# Patient Record
Sex: Male | Born: 1935
Health system: Southern US, Community
[De-identification: ages and names within clinical notes are randomized; demographics above are authoritative.]

## PROBLEM LIST (undated history)

## (undated) DIAGNOSIS — I4891 Unspecified atrial fibrillation: Secondary | ICD-10-CM

## (undated) DIAGNOSIS — R112 Nausea with vomiting, unspecified: Secondary | ICD-10-CM

## (undated) DIAGNOSIS — N400 Enlarged prostate without lower urinary tract symptoms: Secondary | ICD-10-CM

## (undated) DIAGNOSIS — E039 Hypothyroidism, unspecified: Secondary | ICD-10-CM

## (undated) DIAGNOSIS — Z87442 Personal history of urinary calculi: Secondary | ICD-10-CM

## (undated) DIAGNOSIS — J189 Pneumonia, unspecified organism: Secondary | ICD-10-CM

## (undated) DIAGNOSIS — R5383 Other fatigue: Secondary | ICD-10-CM

## (undated) DIAGNOSIS — I517 Cardiomegaly: Secondary | ICD-10-CM

## (undated) DIAGNOSIS — R0989 Other specified symptoms and signs involving the circulatory and respiratory systems: Secondary | ICD-10-CM

## (undated) DIAGNOSIS — Z973 Presence of spectacles and contact lenses: Secondary | ICD-10-CM

## (undated) DIAGNOSIS — R9430 Abnormal result of cardiovascular function study, unspecified: Secondary | ICD-10-CM

## (undated) DIAGNOSIS — I340 Nonrheumatic mitral (valve) insufficiency: Secondary | ICD-10-CM

## (undated) DIAGNOSIS — R197 Diarrhea, unspecified: Secondary | ICD-10-CM

## (undated) DIAGNOSIS — R972 Elevated prostate specific antigen [PSA]: Secondary | ICD-10-CM

## (undated) DIAGNOSIS — I499 Cardiac arrhythmia, unspecified: Secondary | ICD-10-CM

## (undated) DIAGNOSIS — R42 Dizziness and giddiness: Secondary | ICD-10-CM

## (undated) DIAGNOSIS — K56609 Unspecified intestinal obstruction, unspecified as to partial versus complete obstruction: Secondary | ICD-10-CM

## (undated) DIAGNOSIS — K219 Gastro-esophageal reflux disease without esophagitis: Secondary | ICD-10-CM

## (undated) DIAGNOSIS — I2510 Atherosclerotic heart disease of native coronary artery without angina pectoris: Secondary | ICD-10-CM

## (undated) DIAGNOSIS — D649 Anemia, unspecified: Secondary | ICD-10-CM

## (undated) DIAGNOSIS — K5090 Crohn's disease, unspecified, without complications: Secondary | ICD-10-CM

## (undated) DIAGNOSIS — Z7901 Long term (current) use of anticoagulants: Secondary | ICD-10-CM

## (undated) DIAGNOSIS — Z8701 Personal history of pneumonia (recurrent): Secondary | ICD-10-CM

## (undated) DIAGNOSIS — R5381 Other malaise: Secondary | ICD-10-CM

## (undated) DIAGNOSIS — R339 Retention of urine, unspecified: Secondary | ICD-10-CM

## (undated) DIAGNOSIS — I8290 Acute embolism and thrombosis of unspecified vein: Secondary | ICD-10-CM

## (undated) HISTORY — DX: Acute embolism and thrombosis of unspecified vein: I82.90

## (undated) HISTORY — DX: Cardiomegaly: I51.7

## (undated) HISTORY — DX: Abnormal result of cardiovascular function study, unspecified: R94.30

## (undated) HISTORY — DX: Gastro-esophageal reflux disease without esophagitis: K21.9

## (undated) HISTORY — DX: Other specified symptoms and signs involving the circulatory and respiratory systems: R09.89

## (undated) HISTORY — DX: Long term (current) use of anticoagulants: Z79.01

## (undated) HISTORY — PX: CYSTOSCOPY WITH INSERTION OF UROLIFT: SHX6678

## (undated) HISTORY — DX: Dizziness and giddiness: R42

## (undated) HISTORY — DX: Other fatigue: R53.83

## (undated) HISTORY — DX: Diarrhea, unspecified: R19.7

## (undated) HISTORY — DX: Elevated prostate specific antigen (PSA): R97.20

## (undated) HISTORY — DX: Unspecified atrial fibrillation: I48.91

## (undated) HISTORY — DX: Personal history of pneumonia (recurrent): Z87.01

## (undated) HISTORY — DX: Nausea with vomiting, unspecified: R11.2

## (undated) HISTORY — DX: Atherosclerotic heart disease of native coronary artery without angina pectoris: I25.10

## (undated) HISTORY — DX: Nonrheumatic mitral (valve) insufficiency: I34.0

## (undated) HISTORY — DX: Other malaise: R53.81

---

## 2002-10-16 HISTORY — PX: CARDIAC CATHETERIZATION: SHX172

## 2003-10-05 ENCOUNTER — Inpatient Hospital Stay (HOSPITAL_COMMUNITY): Admission: RE | Admit: 2003-10-05 | Discharge: 2003-10-08 | Payer: Self-pay | Admitting: Cardiology

## 2003-10-06 ENCOUNTER — Encounter: Payer: Self-pay | Admitting: Internal Medicine

## 2004-09-06 ENCOUNTER — Ambulatory Visit: Payer: Self-pay | Admitting: Cardiology

## 2004-10-04 ENCOUNTER — Ambulatory Visit: Payer: Self-pay | Admitting: Cardiology

## 2004-10-11 ENCOUNTER — Ambulatory Visit: Payer: Self-pay | Admitting: Cardiology

## 2004-10-21 ENCOUNTER — Ambulatory Visit: Payer: Self-pay | Admitting: Cardiology

## 2004-11-11 ENCOUNTER — Ambulatory Visit: Payer: Self-pay | Admitting: Cardiology

## 2004-12-09 ENCOUNTER — Ambulatory Visit: Payer: Self-pay

## 2005-01-06 ENCOUNTER — Ambulatory Visit: Payer: Self-pay | Admitting: Cardiology

## 2005-01-11 ENCOUNTER — Ambulatory Visit: Payer: Self-pay | Admitting: Cardiology

## 2005-02-03 ENCOUNTER — Ambulatory Visit: Payer: Self-pay | Admitting: Cardiology

## 2005-02-10 ENCOUNTER — Ambulatory Visit: Payer: Self-pay | Admitting: Cardiology

## 2005-03-10 ENCOUNTER — Ambulatory Visit: Payer: Self-pay | Admitting: Cardiology

## 2005-03-28 ENCOUNTER — Ambulatory Visit: Payer: Self-pay | Admitting: Cardiology

## 2005-04-25 ENCOUNTER — Ambulatory Visit: Payer: Self-pay | Admitting: Cardiology

## 2005-04-28 ENCOUNTER — Ambulatory Visit: Payer: Self-pay | Admitting: Cardiology

## 2005-05-05 ENCOUNTER — Ambulatory Visit: Payer: Self-pay | Admitting: Cardiology

## 2005-06-06 ENCOUNTER — Ambulatory Visit: Payer: Self-pay | Admitting: Cardiology

## 2005-07-07 ENCOUNTER — Ambulatory Visit: Payer: Self-pay | Admitting: Cardiology

## 2005-08-08 ENCOUNTER — Ambulatory Visit: Payer: Self-pay | Admitting: Cardiology

## 2005-09-12 ENCOUNTER — Ambulatory Visit: Payer: Self-pay | Admitting: Cardiology

## 2005-09-15 ENCOUNTER — Ambulatory Visit: Payer: Self-pay | Admitting: Cardiology

## 2005-10-10 ENCOUNTER — Ambulatory Visit: Payer: Self-pay | Admitting: Cardiology

## 2005-11-07 ENCOUNTER — Ambulatory Visit: Payer: Self-pay | Admitting: Cardiology

## 2005-12-05 ENCOUNTER — Ambulatory Visit: Payer: Self-pay | Admitting: Cardiology

## 2005-12-07 ENCOUNTER — Ambulatory Visit: Payer: Self-pay | Admitting: Cardiology

## 2006-01-02 ENCOUNTER — Ambulatory Visit: Payer: Self-pay | Admitting: Cardiology

## 2006-01-09 ENCOUNTER — Ambulatory Visit: Payer: Self-pay | Admitting: Cardiology

## 2006-01-30 ENCOUNTER — Ambulatory Visit: Payer: Self-pay | Admitting: Cardiology

## 2006-02-13 ENCOUNTER — Ambulatory Visit: Payer: Self-pay | Admitting: Cardiology

## 2006-02-20 ENCOUNTER — Ambulatory Visit: Payer: Self-pay | Admitting: Cardiology

## 2006-03-02 ENCOUNTER — Ambulatory Visit: Payer: Self-pay | Admitting: Cardiology

## 2006-03-30 ENCOUNTER — Ambulatory Visit: Payer: Self-pay | Admitting: Cardiology

## 2006-04-13 ENCOUNTER — Ambulatory Visit: Payer: Self-pay | Admitting: Cardiology

## 2006-05-11 ENCOUNTER — Ambulatory Visit: Payer: Self-pay | Admitting: Cardiology

## 2006-06-08 ENCOUNTER — Ambulatory Visit: Payer: Self-pay | Admitting: Cardiology

## 2006-06-22 ENCOUNTER — Ambulatory Visit: Payer: Self-pay | Admitting: Cardiology

## 2006-06-28 ENCOUNTER — Ambulatory Visit: Payer: Self-pay | Admitting: Cardiology

## 2006-07-13 ENCOUNTER — Ambulatory Visit: Payer: Self-pay | Admitting: Cardiology

## 2006-07-19 ENCOUNTER — Ambulatory Visit: Payer: Self-pay | Admitting: Cardiology

## 2006-08-16 ENCOUNTER — Ambulatory Visit: Payer: Self-pay | Admitting: Cardiology

## 2006-08-23 ENCOUNTER — Ambulatory Visit: Payer: Self-pay | Admitting: Cardiology

## 2006-09-05 ENCOUNTER — Ambulatory Visit: Payer: Self-pay | Admitting: Cardiology

## 2006-09-26 ENCOUNTER — Ambulatory Visit: Payer: Self-pay | Admitting: Cardiology

## 2006-10-03 ENCOUNTER — Ambulatory Visit: Payer: Self-pay | Admitting: Cardiology

## 2006-10-16 HISTORY — PX: COLONOSCOPY: SHX174

## 2006-10-26 ENCOUNTER — Ambulatory Visit: Payer: Self-pay | Admitting: Cardiology

## 2006-11-21 ENCOUNTER — Ambulatory Visit: Payer: Self-pay | Admitting: Cardiology

## 2006-12-19 ENCOUNTER — Ambulatory Visit: Payer: Self-pay | Admitting: Cardiology

## 2007-01-02 ENCOUNTER — Ambulatory Visit: Payer: Self-pay | Admitting: Cardiology

## 2007-01-21 ENCOUNTER — Ambulatory Visit: Payer: Self-pay | Admitting: Cardiology

## 2007-02-05 ENCOUNTER — Ambulatory Visit: Payer: Self-pay | Admitting: Cardiology

## 2007-03-05 ENCOUNTER — Ambulatory Visit: Payer: Self-pay | Admitting: Cardiology

## 2007-04-02 ENCOUNTER — Ambulatory Visit: Payer: Self-pay | Admitting: Cardiology

## 2007-04-18 ENCOUNTER — Ambulatory Visit: Payer: Self-pay | Admitting: Cardiology

## 2007-05-01 ENCOUNTER — Ambulatory Visit: Payer: Self-pay | Admitting: Cardiology

## 2007-05-16 ENCOUNTER — Ambulatory Visit: Payer: Self-pay | Admitting: Physician Assistant

## 2007-06-13 ENCOUNTER — Ambulatory Visit: Payer: Self-pay | Admitting: Cardiology

## 2007-07-02 ENCOUNTER — Ambulatory Visit: Payer: Self-pay | Admitting: Cardiology

## 2007-07-18 ENCOUNTER — Ambulatory Visit: Payer: Self-pay | Admitting: Cardiology

## 2007-08-15 ENCOUNTER — Ambulatory Visit: Payer: Self-pay | Admitting: Cardiology

## 2007-09-09 ENCOUNTER — Ambulatory Visit: Payer: Self-pay | Admitting: Cardiology

## 2007-10-07 ENCOUNTER — Ambulatory Visit: Payer: Self-pay | Admitting: Cardiology

## 2007-11-04 ENCOUNTER — Ambulatory Visit: Payer: Self-pay | Admitting: Cardiology

## 2007-12-02 ENCOUNTER — Ambulatory Visit: Payer: Self-pay | Admitting: Cardiology

## 2008-01-02 ENCOUNTER — Ambulatory Visit: Payer: Self-pay | Admitting: Cardiology

## 2008-02-03 ENCOUNTER — Ambulatory Visit: Payer: Self-pay | Admitting: Cardiology

## 2008-02-18 ENCOUNTER — Ambulatory Visit: Payer: Self-pay | Admitting: Cardiology

## 2008-03-18 ENCOUNTER — Ambulatory Visit: Payer: Self-pay | Admitting: Cardiology

## 2008-04-16 ENCOUNTER — Ambulatory Visit: Payer: Self-pay | Admitting: Cardiology

## 2008-05-12 ENCOUNTER — Ambulatory Visit: Payer: Self-pay | Admitting: Cardiology

## 2008-06-24 ENCOUNTER — Ambulatory Visit: Payer: Self-pay | Admitting: Cardiology

## 2008-07-21 ENCOUNTER — Ambulatory Visit: Payer: Self-pay | Admitting: Cardiology

## 2008-08-18 ENCOUNTER — Ambulatory Visit: Payer: Self-pay | Admitting: Cardiology

## 2008-09-15 ENCOUNTER — Ambulatory Visit: Payer: Self-pay | Admitting: Cardiology

## 2008-10-16 HISTORY — PX: CHOLECYSTECTOMY: SHX55

## 2008-10-20 ENCOUNTER — Ambulatory Visit: Payer: Self-pay | Admitting: Cardiology

## 2008-11-25 ENCOUNTER — Ambulatory Visit: Payer: Self-pay | Admitting: Cardiology

## 2008-12-22 ENCOUNTER — Ambulatory Visit: Payer: Self-pay | Admitting: Cardiology

## 2009-01-06 ENCOUNTER — Encounter: Payer: Self-pay | Admitting: Cardiology

## 2009-01-19 ENCOUNTER — Ambulatory Visit: Payer: Self-pay | Admitting: Cardiology

## 2009-02-13 DIAGNOSIS — R9430 Abnormal result of cardiovascular function study, unspecified: Secondary | ICD-10-CM

## 2009-02-23 ENCOUNTER — Ambulatory Visit: Payer: Self-pay | Admitting: Cardiology

## 2009-02-25 ENCOUNTER — Encounter: Payer: Self-pay | Admitting: Cardiology

## 2009-03-03 ENCOUNTER — Ambulatory Visit: Payer: Self-pay | Admitting: Cardiology

## 2009-03-23 ENCOUNTER — Ambulatory Visit: Payer: Self-pay | Admitting: Cardiology

## 2009-04-20 ENCOUNTER — Ambulatory Visit: Payer: Self-pay | Admitting: Cardiology

## 2009-05-18 ENCOUNTER — Ambulatory Visit: Payer: Self-pay | Admitting: Cardiology

## 2009-05-31 ENCOUNTER — Encounter: Payer: Self-pay | Admitting: *Deleted

## 2009-06-15 ENCOUNTER — Ambulatory Visit: Payer: Self-pay | Admitting: Cardiology

## 2009-06-15 LAB — CONVERTED CEMR LAB: Prothrombin Time: 22.2 s

## 2009-07-13 ENCOUNTER — Ambulatory Visit: Payer: Self-pay | Admitting: Cardiology

## 2009-08-10 ENCOUNTER — Ambulatory Visit: Payer: Self-pay | Admitting: Cardiology

## 2009-08-10 LAB — CONVERTED CEMR LAB: POC INR: 2.4

## 2009-09-07 ENCOUNTER — Ambulatory Visit: Payer: Self-pay | Admitting: Cardiology

## 2009-10-22 ENCOUNTER — Ambulatory Visit: Payer: Self-pay | Admitting: Cardiology

## 2009-11-12 ENCOUNTER — Ambulatory Visit: Payer: Self-pay | Admitting: Cardiology

## 2009-12-10 ENCOUNTER — Ambulatory Visit: Payer: Self-pay | Admitting: Cardiology

## 2009-12-10 LAB — CONVERTED CEMR LAB: POC INR: 2.1

## 2009-12-30 ENCOUNTER — Encounter: Payer: Self-pay | Admitting: Cardiology

## 2010-01-11 ENCOUNTER — Ambulatory Visit: Payer: Self-pay | Admitting: Cardiology

## 2010-01-11 LAB — CONVERTED CEMR LAB: POC INR: 2.5

## 2010-01-14 DIAGNOSIS — R112 Nausea with vomiting, unspecified: Secondary | ICD-10-CM | POA: Insufficient documentation

## 2010-01-14 DIAGNOSIS — R197 Diarrhea, unspecified: Secondary | ICD-10-CM

## 2010-01-18 ENCOUNTER — Encounter: Payer: Self-pay | Admitting: Cardiology

## 2010-01-18 ENCOUNTER — Ambulatory Visit: Payer: Self-pay | Admitting: Cardiology

## 2010-01-19 ENCOUNTER — Encounter: Payer: Self-pay | Admitting: Cardiology

## 2010-01-20 ENCOUNTER — Encounter: Payer: Self-pay | Admitting: Cardiology

## 2010-01-21 ENCOUNTER — Encounter: Payer: Self-pay | Admitting: Cardiology

## 2010-02-08 ENCOUNTER — Ambulatory Visit: Payer: Self-pay | Admitting: Cardiology

## 2010-02-08 LAB — CONVERTED CEMR LAB: POC INR: 2.5

## 2010-03-03 ENCOUNTER — Encounter: Payer: Self-pay | Admitting: Cardiology

## 2010-03-16 ENCOUNTER — Encounter: Payer: Self-pay | Admitting: Cardiology

## 2010-03-16 DIAGNOSIS — R42 Dizziness and giddiness: Secondary | ICD-10-CM

## 2010-03-17 ENCOUNTER — Encounter: Payer: Self-pay | Admitting: Cardiology

## 2010-03-17 ENCOUNTER — Ambulatory Visit: Payer: Self-pay | Admitting: Cardiology

## 2010-03-17 LAB — CONVERTED CEMR LAB: POC INR: 2.4

## 2010-03-18 ENCOUNTER — Encounter: Payer: Self-pay | Admitting: Cardiology

## 2010-04-07 ENCOUNTER — Encounter: Payer: Self-pay | Admitting: Cardiology

## 2010-04-21 ENCOUNTER — Ambulatory Visit: Payer: Self-pay | Admitting: Cardiology

## 2010-04-21 ENCOUNTER — Encounter: Payer: Self-pay | Admitting: Cardiology

## 2010-04-22 ENCOUNTER — Encounter: Payer: Self-pay | Admitting: Cardiology

## 2010-05-03 ENCOUNTER — Ambulatory Visit: Payer: Self-pay | Admitting: Cardiology

## 2010-05-24 ENCOUNTER — Ambulatory Visit: Payer: Self-pay | Admitting: Cardiology

## 2010-05-25 ENCOUNTER — Ambulatory Visit: Payer: Self-pay | Admitting: Cardiology

## 2010-06-21 ENCOUNTER — Ambulatory Visit: Payer: Self-pay | Admitting: Cardiology

## 2010-06-21 LAB — CONVERTED CEMR LAB: POC INR: 1.9

## 2010-07-12 ENCOUNTER — Ambulatory Visit: Payer: Self-pay | Admitting: Cardiology

## 2010-07-12 LAB — CONVERTED CEMR LAB: POC INR: 2.3

## 2010-08-09 ENCOUNTER — Ambulatory Visit: Payer: Self-pay | Admitting: Cardiology

## 2010-08-09 LAB — CONVERTED CEMR LAB: POC INR: 2.3

## 2010-09-13 ENCOUNTER — Ambulatory Visit: Payer: Self-pay | Admitting: Cardiology

## 2010-09-13 LAB — CONVERTED CEMR LAB: POC INR: 2.1

## 2010-09-16 ENCOUNTER — Encounter: Payer: Self-pay | Admitting: Cardiology

## 2010-09-17 ENCOUNTER — Encounter: Payer: Self-pay | Admitting: Cardiology

## 2010-09-19 ENCOUNTER — Encounter: Payer: Self-pay | Admitting: Cardiology

## 2010-10-14 ENCOUNTER — Ambulatory Visit: Payer: Self-pay | Admitting: Cardiology

## 2010-11-11 ENCOUNTER — Ambulatory Visit: Admission: RE | Admit: 2010-11-11 | Discharge: 2010-11-11 | Payer: Self-pay | Source: Home / Self Care

## 2010-11-13 LAB — CONVERTED CEMR LAB: INR: 2.4

## 2010-11-15 NOTE — Letter (Signed)
Summary: Appointment- Rescheduled   [facility]. [address] [place], [place] [address]   Phone: [phone]  Fax: [phone]     Mar 03, 2010 MRN: [id number]     [name] [address] [place], [place]  [address]     Dear [name],   Due to a change in our office schedule, your appointment on  March 23, 2010 at 9:15 am  must be changed.    Your new appointment will be March 17, 2010 at 1:15pm  We look forward to participating in your health care needs.      Sincerely,  Public relations account executive

## 2010-11-15 NOTE — Medication Information (Signed)
Summary: ccr-lr  Anticoagulant Therapy  Managed by: Edrick Oh, RN PCP: Consuello Masse, MD Supervising MD: Aundra Dubin MD, [place] Indication 1: Atrial Fibrillation (ICD-427.31) Lab Used: [place] Site: [place] INR POC 2.1 INR RANGE 1.7  Dietary changes: no    Health status changes: no    Bleeding/hemorrhagic complications: no    Recent/future hospitalizations: no    Any changes in medication regimen? no    Recent/future dental: no  Any missed doses?: no       Is patient compliant with meds? yes       Allergies: No Known Drug Allergies  Anticoagulation Management History:      The patient is taking warfarin and comes in today for a routine follow up visit.  He is being anticoagulated because of chronic atrial fibrillation.  Positive risk factors for bleeding include an age of 75 years or older.  The bleeding index is 'intermediate risk'.  Positive CHADS2 values include Age > 70 years old.  The start date was 10/12/2003.  Plans are to continue warfarin for life.  His last INR was 1.7.  Anticoagulation responsible provider: Aundra Dubin MD, [place].  INR POC: 2.1.  Cuvette Lot#: [id number].  Exp: 10/11.    Anticoagulation Management Assessment/Plan:      The patient's current anticoagulation dose is Warfarin sodium 2.5 mg tabs: Take 1 tablet by mouth daily except 1 1/2 tablets on T, Th, Sat.  The target INR is 2.0-3.0.  The next INR is due 10/14/2010.  Anticoagulation instructions were given to patient.  Results were reviewed/authorized by Edrick Oh, RN.  He was notified by Edrick Oh RN.         Prior Anticoagulation Instructions: INR 2.3 Continue coumadin 2.18m once daily except 3.7629mon T,Th,Sat  Current Anticoagulation Instructions: INR 2.1 Continue coumadin 2.29m929mnce daily except 3.729m46m T,Th,Sat

## 2010-11-15 NOTE — Miscellaneous (Signed)
  Clinical Lists Changes  Problems: Added new problem of GERD (ICD-530.81) Added new problem of * LA CLOT Added new problem of MITRAL REGURGITATION (ICD-396.3) Added new problem of COUMADIN THERAPY (ICD-V58.61) Observations: Added new observation of PAST MED HX: FATIGUE / MALAISE (ICD-780.79) CAD  cath 2004...mild/mod nonobstuctive   /     nuclear...2007....small inferior scar ??, no ischemia ATRIAL FIBRILLATION GERD LA clot....small...in past EF  55-60%...echo.Marland Kitchen.02/2009 [facility]   mild..echo.Marland Kitchen.02/2009 MR  mild...echo...flat closure Elevated PSA Coumadin Rx (03/16/2010 10:54) Added new observation of PRIMARY MD: Consuello Masse, MD (03/16/2010 10:54)       Past History:  Past Medical History: FATIGUE / MALAISE (ICD-780.79) CAD  cath 2004...mild/mod nonobstuctive   /     nuclear...2007....small inferior scar ??, no ischemia ATRIAL FIBRILLATION GERD LA clot....small...in past EF  55-60%...echo.Marland Kitchen.02/2009 [facility]   mild..echo.Marland Kitchen.02/2009 MR  mild...echo...flat closure Elevated PSA Coumadin Rx

## 2010-11-15 NOTE — Letter (Signed)
Summary: Appointment- Rescheduled  [facility]. [address] [place], [place] [address]   Phone: [phone]  Fax: [phone]     April 07, 2010 MRN: [id number]     JANES COLEGROVE [place], [place]  [address]     Dear Mr. DANNEMILLER,   Due to a change in our office schedule, your appointment on   April 20, 2010 at  2:45 pm must be changed.   Your new appointment has been scheduled July 7th, 2011 at  3:15pm.   We look forward to participating in your health care needs.      Sincerely,  Public relations account executive

## 2010-11-15 NOTE — Medication Information (Signed)
Summary: ccr-lr  Anticoagulant Therapy  Managed by: Edrick Oh, RN PCP: Consuello Masse, MD Supervising MD: Percival Spanish MD, Jeneen Rinks Indication 1: Atrial Fibrillation (ICD-427.31) Lab Used: [place] Site: [place] INR POC 1.9 INR RANGE 1.7  Dietary changes: no    Health status changes: no    Bleeding/hemorrhagic complications: no    Recent/future hospitalizations: yes       Details: In [place] x 4 days 06/07/10    Any changes in medication regimen? no    Recent/future dental: no  Any missed doses?: yes     Details: Was off coumadin x 4 days  Is patient compliant with meds? yes       Allergies: No Known Drug Allergies  Anticoagulation Management History:      The patient is taking warfarin and comes in today for a routine follow up visit.  He is being anticoagulated due to chronic atrial fibrillation.  Positive risk factors for bleeding include an age of 75 years or older.  The bleeding index is 'intermediate risk'.  Positive CHADS2 values include Age > 82 years old.  The start date was 10/12/2003.  Plans are to continue warfarin for life.  His last INR was 1.7.  Anticoagulation responsible provider: Percival Spanish MD, Jeneen Rinks.  INR POC: 1.9.  Cuvette Lot#: [id number].  Exp: 10/11.    Anticoagulation Management Assessment/Plan:      The patient's current anticoagulation dose is Warfarin sodium 2.5 mg tabs: Take 1 tablet by mouth daily except 1 1/2 tablets on T, Th, Sat.  The target INR is 2.0-3.0.  The next INR is due 07/12/2010.  Anticoagulation instructions were given to patient.  Results were reviewed/authorized by Edrick Oh, RN.  He was notified by Edrick Oh RN.         Prior Anticoagulation Instructions: INR 2.5 Continue coumadin 2.28m once daily except 3.729mon T,Th.Sat  Current Anticoagulation Instructions: INR 1.9 Take 47m80monight, 3.747m31m Wednesday night then resume 2.47mg 73me daily except 3.747mg 30m,Th,Sat

## 2010-11-15 NOTE — Medication Information (Signed)
Summary: ccr-lr  Anticoagulant Therapy  Managed by: Edrick Oh, RN PCP: Consuello Masse, MD Supervising MD: Dannielle Burn MD, Luvenia Heller Indication 1: Atrial Fibrillation (ICD-427.31) Lab Used: [place] Site: [place] INR POC 2.3 INR RANGE 1.7  Dietary changes: no    Health status changes: no    Bleeding/hemorrhagic complications: no    Recent/future hospitalizations: no    Any changes in medication regimen? yes       Details: Been on Meclazine for vertigo  Recent/future dental: no  Any missed doses?: no       Is patient compliant with meds? yes       Allergies: No Known Drug Allergies  Anticoagulation Management History:      The patient is taking warfarin and comes in today for a routine follow up visit.  He is being anticoagulated because of chronic atrial fibrillation.  Positive risk factors for bleeding include an age of 50 years or older.  The bleeding index is 'intermediate risk'.  Positive CHADS2 values include Age > 63 years old.  The start date was 10/12/2003.  Plans are to continue warfarin for life.  His last INR was 1.7.  Anticoagulation responsible provider: Dannielle Burn MD, Luvenia Heller.  INR POC: 2.3.  Cuvette Lot#: [id number].  Exp: 10/11.    Anticoagulation Management Assessment/Plan:      The patient's current anticoagulation dose is Warfarin sodium 2.5 mg tabs: Take 1 tablet by mouth daily except 1 1/2 tablets on T, Th, Sat.  The target INR is 2.0-3.0.  The next INR is due 09/13/2010.  Anticoagulation instructions were given to patient.  Results were reviewed/authorized by Edrick Oh, RN.  He was notified by Edrick Oh RN.         Prior Anticoagulation Instructions: INR 2.3 Continue coumadin 2.40m once daily except 3.725mon T,Th,Sat  Current Anticoagulation Instructions: Same as Prior Instructions.

## 2010-11-15 NOTE — Medication Information (Signed)
Summary: ccr-lr  Anticoagulant Therapy  Managed by: Edrick Oh, RN PCP: Consuello Masse, MD Supervising MD: Percival Spanish MD, Jeneen Rinks Indication 1: Atrial Fibrillation (ICD-427.31) Lab Used: [place] Site: [place] INR POC 2.5 INR RANGE 1.7  Dietary changes: no    Health status changes: no    Bleeding/hemorrhagic complications: no    Recent/future hospitalizations: no    Any changes in medication regimen? no    Recent/future dental: no  Any missed doses?: no       Is patient compliant with meds? yes       Allergies: No Known Drug Allergies  Anticoagulation Management History:      The patient is taking warfarin and comes in today for a routine follow up visit.  He is being anticoagulated due to chronic atrial fibrillation.  Positive risk factors for bleeding include an age of 75 years or older.  The bleeding index is 'intermediate risk'.  Positive CHADS2 values include Age > 75 years old.  The start date was 10/12/2003.  Plans are to continue warfarin for life.  His last INR was 1.7.  Anticoagulation responsible provider: Percival Spanish MD, Jeneen Rinks.  INR POC: 2.5.  Cuvette Lot#: [id number].  Exp: 10/11.    Anticoagulation Management Assessment/Plan:      The patient's current anticoagulation dose is Warfarin sodium 2.5 mg tabs: Take 1 tablet by mouth daily except 1 1/2 tablets on T, Th, Sat.  The target INR is 2.0-3.0.  The next INR is due 06/21/2010.  Anticoagulation instructions were given to patient.  Results were reviewed/authorized by Edrick Oh, RN.  He was notified by Edrick Oh RN.         Prior Anticoagulation Instructions: INR 2.0 Increase coumadin to 2.54m once daily except 3.761mon T,Th,Sat  Current Anticoagulation Instructions: INR 2.5 Continue coumadin 2.11m18mnce daily except 3.711m81m T,Th.Sat Prescriptions: WARFARIN SODIUM 2.5 MG TABS (WARFARIN SODIUM) Take 1 tablet by mouth daily except 1 1/2 tablets on T, Th, Sat  #45 x 3   Entered by:   LisaEdrick Oh Authorized by:   Guy Terald Sleeper, FACCIronbound Endosurgical Center Incigned by:   LisaEdrick Ohon 05/24/2010   Method used:   Electronically to        [facility]       [address]   [place]  [id number]   Ph: [phone]   Fax: [phone]:   [id number]

## 2010-11-15 NOTE — Medication Information (Signed)
Summary: ccr-lr  Anticoagulant Therapy  Managed by: Edrick Oh, RN PCP: Trilby Leaver MD: Ron Parker MD, Dellis Filbert Indication 1: Atrial Fibrillation (ICD-427.31) Lab Used: [place] Site: [place] INR POC 2.0  Dietary changes: no    Health status changes: no    Bleeding/hemorrhagic complications: no    Recent/future hospitalizations: no    Any changes in medication regimen? no    Recent/future dental: no  Any missed doses?: no       Is patient compliant with meds? yes       Anticoagulation Management History:      The patient is taking warfarin and comes in today for a routine follow up visit.  Positive risk factors for bleeding include an age of 36 years or older.  The bleeding index is 'intermediate risk'.  Positive CHADS2 values include Age > 8 years old.  The start date was 10/12/2003.  Anticoagulation responsible provider: Ron Parker MD, Dellis Filbert.  INR POC: 2.0.  Exp: 10/11.    Anticoagulation Management Assessment/Plan:      The patient's current anticoagulation dose is Warfarin sodium 2.5 mg tabs: Take 1 tablet by mouth.  The target INR is 2 - 3.  The next INR is due 12/10/2009.  Anticoagulation instructions were given to patient.  Results were reviewed/authorized by Edrick Oh, RN.  He was notified by Edrick Oh RN.         Prior Anticoagulation Instructions: INR 1.6 Take coumadin 2m tonight, 3.71mtomorrow night then resume 2.48m48mnce daily   Current Anticoagulation Instructions: INR 2.0 Increase coumadin to 2.48mg548mce daily except 3.748mg67mFridays

## 2010-11-15 NOTE — Medication Information (Signed)
Summary: ccr-lr  Anticoagulant Therapy  Managed by: Anthony Oh, RN PCP: Anthony Leaver MD: Anthony Burn MD, [name] Indication 1: Atrial Fibrillation (ICD-427.31) Lab Used: [place] Site: [place] INR POC 2.5  Dietary changes: no    Health status changes: no    Bleeding/hemorrhagic complications: no    Recent/future hospitalizations: no    Any changes in medication regimen? no    Recent/future dental: no  Any missed doses?: no       Is patient compliant with meds? yes       Anticoagulation Management History:      The patient is taking warfarin and comes in today for a routine follow up visit.  Positive risk factors for bleeding include an age of 75 years or older.  The bleeding index is 'intermediate risk'.  Positive CHADS2 values include Age > 31 years old.  The start date was 10/12/2003.  Anticoagulation responsible provider: Dannielle Burn MD, [name].  INR POC: 2.5.  Cuvette Lot#: [id number].  Exp: 10/11.    Anticoagulation Management Assessment/Plan:      The patient's current anticoagulation dose is Warfarin sodium 2.5 mg tabs: Take 1 tablet by mouth.  The target INR is 2 - 3.  The next INR is due 02/08/2010.  Anticoagulation instructions were given to patient.  Results were reviewed/authorized by Anthony Oh, RN.  He was notified by Anthony Oh RN.         Prior Anticoagulation Instructions: INR 2.1 Continue coumadin 2.69m once daily except 3.756mon Fridays  Current Anticoagulation Instructions: INR 2.5 Continue coumadin 2.81m33mnce daily except 3.781m57m Fridays

## 2010-11-15 NOTE — Medication Information (Signed)
Summary: [facility]  Anticoagulant Therapy  Managed by: Gurney Maxin, RN PCP: Consuello Masse, MD Supervising MD: Domenic Polite MD, Mikeal Hawthorne Indication 1: Atrial Fibrillation (ICD-427.31) Lab Used: [place] Site: [place] INR POC 1.7  Dietary changes: no    Health status changes: no    Bleeding/hemorrhagic complications: no    Recent/future hospitalizations: no    Any changes in medication regimen? yes       Details: Digoxin recently stopped   Recent/future dental: no  Any missed doses?: no       Is patient compliant with meds? yes       Allergies: No Known Drug Allergies  Anticoagulation Management History:      The patient is taking warfarin and comes in today for a routine follow up visit.  He is being anticoagulated due to chronic atrial fibrillation.  Positive risk factors for bleeding include an age of 39 years or older.  The bleeding index is 'intermediate risk'.  Positive CHADS2 values include Age > 15 years old.  The start date was 10/12/2003.  Plans are to continue warfarin for life.  His last INR was 1.7.  Anticoagulation responsible provider: Domenic Polite MD, Mikeal Hawthorne.  INR POC: 1.7.  Cuvette Lot#: [id number].  Exp: 10/11.    Anticoagulation Management Assessment/Plan:      The patient's current anticoagulation dose is Warfarin sodium 2.5 mg tabs: Take 1 tablet by mouth.  The target INR is 2.0-3.0.  The next INR is due 04/20/2010.  Anticoagulation instructions were given to patient.  Results were reviewed/authorized by Gurney Maxin, RN.  He was notified by Gurney Maxin, RN.         Prior Anticoagulation Instructions: INR 2.4 Continue coumadin 2.76m once daily except 3.732mon Fridays  Current Anticoagulation Instructions: INR 1.7 Take coumadin 3.7538monight then increase dose to 2.5mg77mce daily except 3.75mg72mTuesdays and Fridays

## 2010-11-15 NOTE — Medication Information (Signed)
Summary: ccr-lr  Anticoagulant Therapy  Managed by: Edrick Oh, RN PCP: Trilby Leaver MD: Ron Parker MD, Dellis Filbert Indication 1: Atrial Fibrillation (ICD-427.31) Lab Used: [place] Site: [place] INR POC 2.1  Dietary changes: no    Health status changes: no    Bleeding/hemorrhagic complications: no    Recent/future hospitalizations: no    Any changes in medication regimen? no    Recent/future dental: no  Any missed doses?: no       Is patient compliant with meds? yes       Anticoagulation Management History:      The patient is taking warfarin and comes in today for a routine follow up visit.  Positive risk factors for bleeding include an age of 25 years or older.  The bleeding index is 'intermediate risk'.  Positive CHADS2 values include Age > 75 years old.  The start date was 10/12/2003.  Anticoagulation responsible provider: Ron Parker MD, Dellis Filbert.  INR POC: 2.1.  Cuvette Lot#: [id number].  Exp: 10/11.    Anticoagulation Management Assessment/Plan:      The patient's current anticoagulation dose is Warfarin sodium 2.5 mg tabs: Take 1 tablet by mouth.  The target INR is 2 - 3.  The next INR is due 01/11/2010.  Anticoagulation instructions were given to patient.  Results were reviewed/authorized by Edrick Oh, RN.  He was notified by Edrick Oh RN.         Prior Anticoagulation Instructions: INR 2.0 Increase coumadin to 2.53m once daily except 3.721mon Fridays  Current Anticoagulation Instructions: INR 2.1 Continue coumadin 2.26m70mnce daily except 3.726m22m Fridays

## 2010-11-15 NOTE — Medication Information (Signed)
Summary: ccr-lr  Anticoagulant Therapy  Managed by: Edrick Oh, RN PCP: Trilby Leaver MD: Dannielle Burn MD, Luvenia Heller Indication 1: Atrial Fibrillation (ICD-427.31) Lab Used: [place] Site: [place] INR POC 2.5  Dietary changes: no    Health status changes: no    Bleeding/hemorrhagic complications: no    Recent/future hospitalizations: no    Any changes in medication regimen? no    Recent/future dental: no  Any missed doses?: no       Is patient compliant with meds? yes       Anticoagulation Management History:      The patient is taking warfarin and comes in today for a routine follow up visit.  Positive risk factors for bleeding include an age of 17 years or older.  The bleeding index is 'intermediate risk'.  Positive CHADS2 values include Age > 30 years old.  The start date was 10/12/2003.  Anticoagulation responsible provider: Dannielle Burn MD, Luvenia Heller.  INR POC: 2.5.  Cuvette Lot#: [id number].  Exp: 10/11.    Anticoagulation Management Assessment/Plan:      The patient's current anticoagulation dose is Warfarin sodium 2.5 mg tabs: Take 1 tablet by mouth.  The target INR is 2 - 3.  The next INR is due 03/09/2010.  Anticoagulation instructions were given to patient.  Results were reviewed/authorized by Edrick Oh, RN.  He was notified by Edrick Oh RN.         Prior Anticoagulation Instructions: INR 2.5 Continue coumadin 2.59m once daily except 3.771mon Fridays  Current Anticoagulation Instructions: INR 2.5  Continue coumadin 2.88m66mnce daily except 3.788m28m Fridays

## 2010-11-15 NOTE — Medication Information (Signed)
Summary: ccr-lr  Anticoagulant Therapy  Managed by: Edrick Oh, RN PCP: Trilby Leaver MD: Domenic Polite MD, Mikeal Hawthorne Indication 1: Atrial Fibrillation (ICD-427.31) Lab Used: [place] Site: [place] INR POC 1.6  Dietary changes: no    Health status changes: no    Bleeding/hemorrhagic complications: no    Recent/future hospitalizations: no    Any changes in medication regimen? yes       Details: Tamsulosin for bladder  Recent/future dental: no  Any missed doses?: yes     Details: was off coumadin 6 days for gallbladder surgery  Is patient compliant with meds? yes       Anticoagulation Management History:      The patient is taking warfarin and comes in today for a routine follow up visit.  Positive risk factors for bleeding include an age of 75 years or older.  The bleeding index is 'intermediate risk'.  Positive CHADS2 values include Age > 27 years old.  The start date was 10/12/2003.  Anticoagulation responsible provider: Domenic Polite MD, Mikeal Hawthorne.  INR POC: 1.6.  Cuvette Lot#: [id number].  Exp: 10/11.    Anticoagulation Management Assessment/Plan:      The patient's current anticoagulation dose is Warfarin sodium 2.5 mg tabs: Take 1 tablet by mouth.  The target INR is 2 - 3.  The next INR is due 11/12/2009.  Anticoagulation instructions were given to patient.  Results were reviewed/authorized by Edrick Oh, RN.  He was notified by Edrick Oh RN.         Prior Anticoagulation Instructions: INR 2.1 Continue coumadin 2.55m once daily   Current Anticoagulation Instructions: INR 1.6 Take coumadin 561mtonight, 3.7558momorrow night then resume 2.5mg42mce daily

## 2010-11-15 NOTE — Medication Information (Signed)
Summary: [facility]-LAST CHECK AT [facility] 7/7-[facility]  Anticoagulant Therapy  Managed by: Edrick Oh, RN PCP: Consuello Masse, MD Supervising MD: Dannielle Burn MD, Luvenia Heller Indication 1: Atrial Fibrillation (ICD-427.31) Lab Used: [place] Site: [place] INR POC 2.0 INR RANGE 1.7  Dietary changes: no    Health status changes: no    Bleeding/hemorrhagic complications: no    Recent/future hospitalizations: no    Any changes in medication regimen? no    Recent/future dental: no  Any missed doses?: no       Is patient compliant with meds? yes       Allergies: No Known Drug Allergies  Anticoagulation Management History:      The patient is taking warfarin and comes in today for a routine follow up visit.  He is being anticoagulated due to chronic atrial fibrillation.  Positive risk factors for bleeding include an age of 5 years or older.  The bleeding index is 'intermediate risk'.  Positive CHADS2 values include Age > 52 years old.  The start date was 10/12/2003.  Plans are to continue warfarin for life.  His last INR was 1.7.  Anticoagulation responsible provider: Dannielle Burn MD, Luvenia Heller.  INR POC: 2.0.  Cuvette Lot#: [id number].  Exp: 10/11.    Anticoagulation Management Assessment/Plan:      The patient's current anticoagulation dose is Warfarin sodium 2.5 mg tabs: Take 1 tablet by mouth.  The target INR is 2.0-3.0.  The next INR is due 05/24/2010.  Anticoagulation instructions were given to patient.  Results were reviewed/authorized by Edrick Oh, RN.  He was notified by Edrick Oh RN.         Prior Anticoagulation Instructions: INR 1.7 Take coumadin 3.24m tonight then increase dose to 2.557monce daily except 3.7554mn Tuesdays and Fridays  Current Anticoagulation Instructions: INR 2.0 Increase coumadin to 2.5mg66mce daily except 3.75mg37mT,Th,Sat

## 2010-11-15 NOTE — Letter (Signed)
Summary: External Correspondence/ [employer] [facility]  External Correspondence/ [employer] [facility]   Imported By: Bartholomew Boards 12/30/2009 10:36:16  _____________________________________________________________________  External Attachment:    Type:   Image     Comment:   External Document

## 2010-11-15 NOTE — Medication Information (Signed)
Summary: [facility]  Anticoagulant Therapy  Managed by: Gurney Maxin, RN PCP: Consuello Masse, MD Supervising MD: Domenic Polite MD, Mikeal Hawthorne Indication 1: Atrial Fibrillation (ICD-427.31) Lab Used: [place] Site: [place] INR POC 2.4  Dietary changes: no    Health status changes: no    Bleeding/hemorrhagic complications: no    Recent/future hospitalizations: no    Any changes in medication regimen? no    Recent/future dental: no  Any missed doses?: no       Is patient compliant with meds? yes       Allergies: No Known Drug Allergies  Anticoagulation Management History:      The patient is taking warfarin and comes in today for a routine follow up visit.  Positive risk factors for bleeding include an age of 75 years or older.  The bleeding index is 'intermediate risk'.  Positive CHADS2 values include Age > 52 years old.  The start date was 10/12/2003.  His last INR was 2.4.  Anticoagulation responsible provider: Domenic Polite MD, Mikeal Hawthorne.  INR POC: 2.4.  Cuvette Lot#: [id number].  Exp: 10/11.    Anticoagulation Management Assessment/Plan:      The patient's current anticoagulation dose is Warfarin sodium 2.5 mg tabs: Take 1 tablet by mouth.  The target INR is 2 - 3.  The next INR is due 04/20/2010.  Anticoagulation instructions were given to patient.  Results were reviewed/authorized by Gurney Maxin, RN.  He was notified by Gurney Maxin, RN.         Prior Anticoagulation Instructions: INR 2.5  Continue coumadin 2.44m once daily except 3.769mon Fridays  Current Anticoagulation Instructions: INR 2.4 Continue coumadin 2.49m23mnce daily except 3.749m73m Fridays

## 2010-11-15 NOTE — Assessment & Plan Note (Signed)
Summary: 1 YR FU PER MAY REMINDER/INR check-SRS   Visit Type:  Follow-up Primary Provider:  Consuello Masse, MD  CC:  atrial fibrillation.  History of Present Illness: The patient is seen for followup of atrial fibrillation.  I saw him last in the office Feb 23, 2009. 5 he was hospitalized in April, 2011 with GI symptoms.  His cardiac status was stable.  It turns out that he had some type of GI virus it was very significant.  This resolved and he is now stable.  He does have some mild dizziness.  This occurs mostly when he gets up in the morning.  He rests and it stabilizes.  He has not had syncope or presyncope.  He's not had any chest pain.  He has had some generalized fatigue.  This does not appear to be exertional.  He has continued to be quite active.  Current Medications (verified): 1)  Carvedilol 12.5 Mg Tabs (Carvedilol) .... Take 1 Tablet By Mouth Twice A Day 2)  Digoxin 0.125 Mg Tabs (Digoxin) .... Take 1 Tablet By Mouth Once A Day 3)  Diltiazem Hcl Coated Beads 180 Mg Xr24h-Cap (Diltiazem Hcl Coated Beads) .... Take 1 Capsule By Mouth Twice A Day 4)  Warfarin Sodium 2.5 Mg Tabs (Warfarin Sodium) .... Take 1 Tablet By Mouth 5)  Nitroglycerin 0.4 Mg Subl (Nitroglycerin) .... Place One Tablet Under Tongue Every 5 Minutes As Needed For Severe Chest Pain, Not To Exceed 3 in 15 Min Time Frame Place On File 6)  Simvastatin 20 Mg Tabs (Simvastatin) .... Take 1 Tablet By Mouth Once A Day 7)  Tamsulosin Hcl 0.4 Mg Caps (Tamsulosin Hcl) .... Take 1 Tablet By Mouth Once A Day 8)  Omeprazole 20 Mg Cpdr (Omeprazole) .... Take 1 Tablet By Mouth Once A Day As Needed 9)  Levothyroxine Sodium 50 Mcg Tabs (Levothyroxine Sodium) .... Take 1 Tablet By Mouth Once A Day  Allergies (verified): No Known Drug Allergies  Comments:  Nurse/Medical Assistant: The patient's medications and allergies were reviewed with the patient and were updated in the Medication and Allergy Lists.Bottles reviewed.  Past  History:  Past Medical History: FATIGUE / MALAISE (ICD-780.79) CAD  cath 2004...mild/mod nonobstuctive   /     nuclear...2007....small inferior scar ??, no ischemia ATRIAL FIBRILLATION GERD LA clot....small...in past EF  55-60%...echo.Marland Kitchen.02/2009 [facility]   mild..echo.Marland Kitchen.02/2009 [facility]  mild...echo...flat closure Elevated PSA Coumadin Rx Nausea vomiting and diarrhea... hospitalization... April, 2011.... probably viral  Review of Systems       Patient denies fever, chills, headache, sweats, rash, change in vision, change in hearing, chest pain, cough, nausea vomiting, urinary symptoms.  He has slight burning on the bottom of his feet at times.  All other systems are reviewed and are negative.  Vital Signs:  Patient profile:   77 year old male Height:      68 inches Weight:      167 pounds BMI:     25.48 Pulse rate:   65 / minute BP sitting:   117 / 73  (left arm) Cuff size:   regular  Vitals Entered By: Georgina Peer (March 17, 2010 1:07 PM)  Nutrition Counseling: Patient's BMI is greater than 25 and therefore counseled on weight management options.  Physical Exam  General:  patient looks quite good today. Head:  head is atraumatic. Eyes:  no xanthelasma. Neck:  no jugular venous distention. Chest Wall:  no chest wall tenderness. Lungs:  lungs are clear respiratory effort is nonlabored. Heart:  cardiac exam reveals that the rhythm is irregularly irregular.  There is a soft systolic murmur. Abdomen:  abdomen soft. Extremities:  no peripheral edema. Skin:  no skin rashes. Psych:  patient is oriented to person time and place.  Affect is normal.  He is here with his wife today.   Impression & Recommendations:  Problem # 1:  COUMADIN THERAPY (ICD-V58.61)  Orders: Protime INR (97741) We discussed Coumadin and Pradaxa.  He will be a candidate for this new drug over time.  My preference is to wait another 6 months.  Problem # 2:  MITRAL REGURGITATION (ICD-396.3) mitral regurgitation  was mild in May, 2010.  He does not appear to have a signs of heart failure.  I've chosen not to repeat his echo at this time.  Problem # 3:  CAD, NATIVE VESSEL (ICD-414.01)  His updated medication list for this problem includes:    Carvedilol 12.5 Mg Tabs (Carvedilol) .Marland Kitchen... Take 1 tablet by mouth twice a day    Diltiazem Hcl Coated Beads 180 Mg Xr24h-cap (Diltiazem hcl coated beads) .Marland Kitchen... Take 1 capsule by mouth twice a day    Warfarin Sodium 2.5 Mg Tabs (Warfarin sodium) .Marland Kitchen... Take 1 tablet by mouth    Nitroglycerin 0.4 Mg Subl (Nitroglycerin) .Marland Kitchen... Place one tablet under tongue every 5 minutes as needed for severe chest pain, not to exceed 3 in 15 min time frame place on file Coronary disease is stable.  I do not think he's having any significant ischemia.  EKG is done and reviewed by me.  It shows atrial fibrillation with a relatively slow rate.  There is no change in the QRS.  No further workup.  Problem # 4:  ATRIAL FIBRILLATION (ICD-427.31)  His updated medication list for this problem includes:    Carvedilol 12.5 Mg Tabs (Carvedilol) .Marland Kitchen... Take 1 tablet by mouth twice a day    Digoxin 0.125 Mg Tabs (Digoxin) .Marland Kitchen... Take 1 tablet by mouth once a day (hold for now)    Warfarin Sodium 2.5 Mg Tabs (Warfarin sodium) .Marland Kitchen... Take 1 tablet by mouth  Orders: EKG w/ Interpretation (93000) Protime INR (42395) Atrial fib rate is controlled or possibly on the slow side.  I will stop his digoxin and see him for early followup to see if this has any effect on his overall sense of fatigue.  Problem # 5:  DIZZINESS (ICD-780.4) S. time I do not think his dizziness is a significant problem.  However I will reassess his status after his digoxin has been on hold.  Patient Instructions: 1)  Hold Digoxin for now. 2)  Follow up appt on Wednesday, July 6th at 2:45pm. We will check your coumadin this day also, unless Lattie Haw specifies otherwise.   Laboratory Results   Blood Tests   Date/Time Recieved:  March 17, 2010 1:16 PM  Date/Time Reported: March 17, 2010 1:16 PM    INR: 2.4   (Normal Range: 0.88-1.12   Therap INR: 2.0-3.5)

## 2010-11-15 NOTE — Consult Note (Signed)
Summary: [facility]ARDIOLOGY CONSULT/[facility]  CARDIOLOGY CONSULT/[facility]   Imported By: Delfino Lovett 03/17/2010 12:02:11  _____________________________________________________________________  External Attachment:    Type:   Image     Comment:   External Document

## 2010-11-15 NOTE — Medication Information (Signed)
Summary: ccr-lr  Anticoagulant Therapy  Managed by: Edrick Oh, RN PCP: Consuello Masse, MD Supervising MD: Dannielle Burn MD, Luvenia Heller Indication 1: Atrial Fibrillation (ICD-427.31) Lab Used: [place] Site: [place] INR POC 2.3 INR RANGE 1.7  Dietary changes: no    Health status changes: no    Bleeding/hemorrhagic complications: no    Recent/future hospitalizations: no    Any changes in medication regimen? no    Recent/future dental: no  Any missed doses?: no       Is patient compliant with meds? yes       Allergies: No Known Drug Allergies  Anticoagulation Management History:      The patient is taking warfarin and comes in today for a routine follow up visit.  He is being anticoagulated because of chronic atrial fibrillation.  Positive risk factors for bleeding include an age of 75 years or older.  The bleeding index is 'intermediate risk'.  Positive CHADS2 values include Age > 27 years old.  The start date was 10/12/2003.  Plans are to continue warfarin for life.  His last INR was 1.7.  Anticoagulation responsible [name]: Dannielle Burn MD, Luvenia Heller.  INR POC: 2.3.  Cuvette Lot#: [id number].  Exp: 10/11.    Anticoagulation Management Assessment/Plan:      The patient's current anticoagulation dose is Warfarin sodium 2.5 mg tabs: Take 1 tablet by mouth daily except 1 1/2 tablets on T, Th, Sat.  The target INR is 2.0-3.0.  The next INR is due 08/09/2010.  Anticoagulation instructions were given to patient.  Results were reviewed/authorized by Edrick Oh, RN.  He was notified by Edrick Oh RN.         Prior Anticoagulation Instructions: INR 1.9 Take 19m tonight, 3.796mon Wednesday night then resume 2.3m72mnce daily except 3.73m51m T,Th,Sat  Current Anticoagulation Instructions: INR 2.3 Continue coumadin 2.3mg 28me daily except 3.73mg 33m,Th,Sat

## 2010-11-15 NOTE — Assessment & Plan Note (Signed)
Summary: 3 WK F/U PER 04/21/10-JM   Visit Type:  Follow-up Primary Provider:  Consuello Masse, MD  CC:  atrial fibrillation.  History of Present Illness: Patient is seen in follow up later fibrillation.  I saw him last July to 7, 2011.  He had some dizziness and I have stopped his digoxin.  He felt much better when I saw him back.  I decided to see him for another visit to decide if I wanted to restart any medicines.  He feels great.  Preventive Screening-Counseling & Management  Alcohol-Tobacco     Smoking Status: never  Current Medications (verified): 1)  Carvedilol 12.5 Mg Tabs (Carvedilol) .... Take 1 Tablet By Mouth Twice A Day 2)  Diltiazem Hcl Coated Beads 180 Mg Xr24h-Cap (Diltiazem Hcl Coated Beads) .... Take 1 Capsule By Mouth Twice A Day 3)  Warfarin Sodium 2.5 Mg Tabs (Warfarin Sodium) .... Take 1 Tablet By Mouth Daily Except 1 1/2 Tablets On T, Th, Sat 4)  Nitroglycerin 0.4 Mg Subl (Nitroglycerin) .... Place One Tablet Under Tongue Every 5 Minutes As Needed For Severe Chest Pain, Not To Exceed 3 in 15 Min Time Frame Place On File 5)  Simvastatin 20 Mg Tabs (Simvastatin) .... Take 1 Tablet By Mouth Once A Day 6)  Tamsulosin Hcl 0.4 Mg Caps (Tamsulosin Hcl) .... Take 1 Tablet By Mouth Once A Day 7)  Omeprazole 20 Mg Cpdr (Omeprazole) .... Take 1 Tablet By Mouth Once A Day As Needed 8)  Levothyroxine Sodium 50 Mcg Tabs (Levothyroxine Sodium) .... Take 1 Tablet By Mouth Once A Day  Allergies (verified): No Known Drug Allergies  Comments:  Nurse/Medical Assistant: The patient's medication bottles and allergies were reviewed with the patient and were updated in the Medication and Allergy Lists.  Past History:  Past Medical History: FATIGUE / MALAISE (ICD-780.79) CAD  cath 2004...mild/mod nonobstuctive   /     nuclear...2007....small inferior scar ??, no ischemia ATRIAL FIBRILLATION GERD LA clot....small...in past EF  55-60%...echo.Marland Kitchen.02/2009 [facility]   mild..echo.Marland Kitchen.02/2009 MR   mild...echo...flat closure Elevated PSA Coumadin Rx Nausea vomiting and diarrhea... hospitalization... April, 2011.... probably viral Dizziness... mild... June, 2011.....stop digoxin... felt better  Review of Systems       Patient denies fever, chills, headache, sweats, rash, change in vision, change in hearing, chest pain, cough, nausea vomiting, urinary symptoms.  All other systems are reviewed and are negative.  Vital Signs:  Patient profile:   77 year old male Height:      68 inches Weight:      167 pounds Pulse rate:   65 / minute BP sitting:   118 / 77  (left arm) Cuff size:   regular  Vitals Entered By: Georgina Peer (May 25, 2010 10:49 AM)  Physical Exam  General:  He looks great today. Eyes:  no xanthelasma. Neck:  no jugular venous distention. Lungs:  lungs are clear.  Respiratory effort is nonlabored. Heart:  cardiac exam reveals an irregularly irregular rhythm.  The rate is controlled.  There is a soft systolic murmur. Abdomen:  abdomen is soft. Extremities:  no peripheral edema. Psych:  patient is oriented to person time and place.  Affect is normal.   Impression & Recommendations:  Problem # 1:  DIZZINESS (ICD-780.4) Dizziness is resolved.  It improved after his digoxin was stopped.  I cannot prove that this is related.  I chosen not to restart his digoxin.  Problem # 2:  COUMADIN THERAPY (ICD-V58.61) Coumadin dosing is stabilized.  Problem #  3:  ATRIAL FIBRILLATION (ICD-427.31)  His updated medication list for this problem includes:    Carvedilol 12.5 Mg Tabs (Carvedilol) .Marland Kitchen... Take 1 tablet by mouth twice a day    Warfarin Sodium 2.5 Mg Tabs (Warfarin sodium) .Marland Kitchen... Take 1 tablet by mouth daily except 1 1/2 tablets on t, th, sat Atrophic rate is controlled at this time.  I've chosen not to restart his digoxin.  Patient Instructions: 1)  Your physician wants you to follow-up in: 6 months. You will receive a reminder letter in the mail one-two months  in advance. If you don't receive a letter, please call our office to schedule the follow-up appointment. 2)  Your physician recommends that you continue on your current medications as directed. Please refer to the Current Medication list given to you today.

## 2010-11-15 NOTE — Assessment & Plan Note (Signed)
Summary: 3-4 WK F/U PER 6/2 OV/INR-JM   Visit Type:  Follow-up Primary Provider:  Consuello Masse, MD  CC:  atrial fibrillation.  History of Present Illness: The patient is seen for cardiology followup.  When I saw him last on March 17, 2010 he had some mild dizziness.  I felt that his atrial fib rate might be on the slow side.  I put his digoxin on hold.  He feels well.  He has not had any recurrent dizziness, but he is being careful to stand up slowly.  His INR today is lower than it has been.  Some of this may be related to stopping his digoxin.  His wife also mentions that she is having less burning in his feet.  He thinks this may be related to the change of his cholesterol medicine.  Anticoagulation Management History:      The patient is on coumadin and comes in today for a routine follow up visit.  The patient is on Coumadin for chronic atrial fibrillation.  Plans are to keep the patient on coumadin for life.  His last INR was 2.4 and today's INR is 1.7.    Preventive Screening-Counseling & Management  Alcohol-Tobacco     Smoking Status: never  Current Medications (verified): 1)  Carvedilol 12.5 Mg Tabs (Carvedilol) .... Take 1 Tablet By Mouth Twice A Day 2)  Digoxin 0.125 Mg Tabs (Digoxin) .... Take 1 Tablet By Mouth Once A Day (Hold For Now) 3)  Diltiazem Hcl Coated Beads 180 Mg Xr24h-Cap (Diltiazem Hcl Coated Beads) .... Take 1 Capsule By Mouth Twice A Day 4)  Warfarin Sodium 2.5 Mg Tabs (Warfarin Sodium) .... Take 1 Tablet By Mouth 5)  Nitroglycerin 0.4 Mg Subl (Nitroglycerin) .... Place One Tablet Under Tongue Every 5 Minutes As Needed For Severe Chest Pain, Not To Exceed 3 in 15 Min Time Frame Place On File 6)  Simvastatin 20 Mg Tabs (Simvastatin) .... Take 1 Tablet By Mouth Once A Day 7)  Tamsulosin Hcl 0.4 Mg Caps (Tamsulosin Hcl) .... Take 1 Tablet By Mouth Once A Day 8)  Omeprazole 20 Mg Cpdr (Omeprazole) .... Take 1 Tablet By Mouth Once A Day As Needed 9)  Levothyroxine  Sodium 50 Mcg Tabs (Levothyroxine Sodium) .... Take 1 Tablet By Mouth Once A Day  Allergies (verified): No Known Drug Allergies  Comments:  Nurse/Medical Assistant: The patient's medication list and allergies were reviewed with the patient and were updated in the Medication and Allergy Lists.  Past History:  Past Medical History: FATIGUE / MALAISE (ICD-780.79) CAD  cath 2004...mild/mod nonobstuctive   /     nuclear...2007....small inferior scar ??, no ischemia ATRIAL FIBRILLATION GERD LA clot....small...in past EF  55-60%...echo.Marland Kitchen.02/2009 [facility]   mild..echo.Marland Kitchen.02/2009 MR  mild...echo...flat closure Elevated PSA Coumadin Rx Nausea vomiting and diarrhea... hospitalization... April, 2011.... probably viral Dizziness... mild... June, 2011.....  Review of Systems       Patient denies fever, chills, headache, sweats, rash, change in vision, change in hearing, chest pain, cough, nausea vomiting, urinary symptoms.  All other systems are reviewed and are negative  Vital Signs:  Patient profile:   77 year old male Height:      68 inches Weight:      167 pounds Pulse rate:   94 / minute BP sitting:   114 / 70  (left arm) Cuff size:   regular  Vitals Entered By: Georgina Peer (April 21, 2010 3:09 PM)  Physical Exam  General:  patient looks quite  good. Eyes:  no xanthelasma. Neck:  no jugular venous distention. Lungs:  lungs are clear.  Respiratory effort is nonlabored. Heart:  cardiac exam reveals S1 and S2.  There are no clicks or significant murmurs. Abdomen:  abdomen is soft. Extremities:  no peripheral edema. Psych:  patient is oriented to person time and place.  Affect is normal.   Impression & Recommendations:  Problem # 1:  DIZZINESS (ICD-780.4) The patient's dizziness is improved since the prior visit.  It's possible that this is related to stopping his digoxin.  Problem # 2:  COUMADIN THERAPY (ICD-V58.61)  Orders: Protime INR (67341) INR is 1.7 today.  This is  lower than usual and he is usually very stable.  This could possibly be related to the stopping of his digoxin. He does have a history of a small left atrial clot in the past along with his atrial fibrillation.his dose will be adjusted today with very careful early followup.  Problem # 3:  ATRIAL FIBRILLATION (ICD-427.31) His resting rate is a little higher today than I would like.  I considered restarting his digoxin at 0.0625 mg daily.  However I've chosen to keep him off digoxin for now.  I will see him back to recheck his heart rate.  Anticoagulation Management Assessment/Plan:      The target INR is 2.0-3.0.        Patient Instructions: 1)  Follow up with Dr. Ron Parker in about 6 weeks. We will call you with this appt time. 2)  Follow up coumadin visit in 1 week.     Laboratory Results   Blood Tests   Date/Time Recieved: April 21, 2010 3:13 PM  Date/Time Reported: April 21, 2010 3:13 PM    INR: 1.7   (Normal Range: 0.88-1.12   Therap INR: 2.0-3.5)    Anticoagulant Therapy  Managed by: Gurney Maxin, RN PCP: Consuello Masse, MD Supervising MD: Domenic Polite MD, Mikeal Hawthorne Indication 1: Atrial Fibrillation (ICD-427.31) Lab Used: [place] Site: [place] INR RANGE 1.7  Vital Signs: Weight: 167 lbs.  Pulse Rate: 94  Blood Pressure:  114 / 70   Dietary changes: no    Health status changes: no    Bleeding/hemorrhagic complications: no    Recent/future hospitalizations: no    Any changes in medication regimen? no    Recent/future dental: no  Any missed doses?: no       Is patient compliant with meds? yes      Comments: currently taking one tablet of 2.55m daily and 1&1/2 on Fridays.

## 2010-11-15 NOTE — Letter (Signed)
Summary: [facility] D/C DR. Consuello Masse  [facility] D/C DR. PAUL [name]   Imported By: Delfino Lovett 03/17/2010 12:04:12  _____________________________________________________________________  External Attachment:    Type:   Image     Comment:   External Document

## 2010-11-17 NOTE — Medication Information (Signed)
Summary: ccr-lr  Anticoagulant Therapy  Managed by: Edrick Oh, RN PCP: Consuello Masse, MD Supervising MD: Domenic Polite MD, Mikeal Hawthorne Indication 1: Atrial Fibrillation (ICD-427.31) Lab Used: [place] Site: [place] INR POC 2.8 INR RANGE 1.7  Dietary changes: no    Health status changes: no    Bleeding/hemorrhagic complications: no    Recent/future hospitalizations: yes       Details: was in [facility] for instestinal blockage  Any changes in medication regimen? yes       Details: was off coumadin 4 days   Recent/future dental: no  Any missed doses?: no       Is patient compliant with meds? yes       Allergies: No Known Drug Allergies  Anticoagulation Management History:      The patient is taking warfarin and comes in today for a routine follow up visit.  Warfarin therapy is being given due to chronic atrial fibrillation.  Positive risk factors for bleeding include an age of 2 years or older.  The bleeding index is 'intermediate risk'.  Positive CHADS2 values include Age > 40 years old.  The start date was 10/12/2003.  Plans are to continue warfarin for life.  His last INR was 1.7.  Anticoagulation responsible provider: Domenic Polite MD, Mikeal Hawthorne.  INR POC: 2.8.  Cuvette Lot#: [id number].  Exp: 10/11.    Anticoagulation Management Assessment/Plan:      The patient's current anticoagulation dose is Warfarin sodium 2.5 mg tabs: Take 1 tablet by mouth daily except 1 1/2 tablets on T, Th, Sat.  The target INR is 2.0-3.0.  The next INR is due 11/11/2010.  Anticoagulation instructions were given to patient.  Results were reviewed/authorized by Edrick Oh, RN.  He was notified by Edrick Oh RN.         Prior Anticoagulation Instructions: INR 2.1 Continue coumadin 2.72m once daily except 3.760mon T,Th,Sat  Current Anticoagulation Instructions: INR 2.8 Continue coumadin 2.25m33mnce daily except 3.725m51m T,Th,Sat

## 2010-11-17 NOTE — Medication Information (Signed)
Summary: ccr-lr  Anticoagulant Therapy  Managed by: Edrick Oh, RN PCP: Consuello Masse, MD Supervising MD: Dannielle Burn MD, Luvenia Heller Indication 1: Atrial Fibrillation (ICD-427.31) Lab Used: [place] Site: [place] INR POC 2.3 INR RANGE 1.7  Dietary changes: no    Health status changes: no    Bleeding/hemorrhagic complications: no    Recent/future hospitalizations: no    Any changes in medication regimen? no    Recent/future dental: no  Any missed doses?: no       Is patient compliant with meds? yes       Allergies: No Known Drug Allergies  Anticoagulation Management History:      The patient is taking warfarin and comes in today for a routine follow up visit.  Warfarin therapy is being given due to chronic atrial fibrillation.  Positive risk factors for bleeding include an age of 75 years or older.  The bleeding index is 'intermediate risk'.  Positive CHADS2 values include Age > 75 years old.  The start date was 10/12/2003.  Plans are to continue warfarin for life.  His last INR was 1.7.  Anticoagulation responsible provider: Dannielle Burn MD, Luvenia Heller.  INR POC: 2.3.  Cuvette Lot#: [id number].  Exp: 10/11.    Anticoagulation Management Assessment/Plan:      The patient's current anticoagulation dose is Warfarin sodium 2.5 mg tabs: Take 1 tablet by mouth daily except 1 1/2 tablets on T, Th, Sat.  The target INR is 2.0-3.0.  The next INR is due 12/20/2010.  Anticoagulation instructions were given to patient.  Results were reviewed/authorized by Edrick Oh, RN.  He was notified by Edrick Oh RN.         Prior Anticoagulation Instructions: INR 2.8 Continue coumadin 2.29m once daily except 3.7472mon T,Th,Sat  Current Anticoagulation Instructions: INR 2.3 Continue coumadin 2.72m41mnce daily except 3.772m34m Tuesdays, Thursdays and Saturdays

## 2010-11-18 ENCOUNTER — Telehealth (INDEPENDENT_AMBULATORY_CARE_PROVIDER_SITE_OTHER): Payer: Self-pay | Admitting: *Deleted

## 2010-11-23 NOTE — Progress Notes (Signed)
  EMSI request received sent to [facility]  November 18, 2010 11:51 AM

## 2010-12-20 ENCOUNTER — Encounter: Payer: Self-pay | Admitting: Cardiology

## 2010-12-20 ENCOUNTER — Encounter (INDEPENDENT_AMBULATORY_CARE_PROVIDER_SITE_OTHER): Payer: MEDICARE

## 2010-12-20 ENCOUNTER — Ambulatory Visit (INDEPENDENT_AMBULATORY_CARE_PROVIDER_SITE_OTHER): Payer: MEDICARE | Admitting: Cardiology

## 2010-12-20 DIAGNOSIS — I4891 Unspecified atrial fibrillation: Secondary | ICD-10-CM

## 2010-12-20 DIAGNOSIS — I6529 Occlusion and stenosis of unspecified carotid artery: Secondary | ICD-10-CM

## 2010-12-20 DIAGNOSIS — Z7901 Long term (current) use of anticoagulants: Secondary | ICD-10-CM

## 2010-12-20 LAB — CONVERTED CEMR LAB: POC INR: 2.1

## 2010-12-23 ENCOUNTER — Encounter: Payer: Self-pay | Admitting: Cardiology

## 2010-12-27 ENCOUNTER — Encounter: Payer: Self-pay | Admitting: *Deleted

## 2010-12-27 NOTE — Assessment & Plan Note (Signed)
Summary: 6 MO F/U PER FEB REMINDER/TR   Visit Type:  Follow-up Primary Provider:  Consuello Masse, MD  CC:  atrial fibrillation and CAD.  History of Present Illness: The patient is seen for follow up of atrial fibrillation and coronary artery disease.  I saw him last in August, 2011.  Since that time he was hospitalized with recurrent small bowel obstruction.  I reviewed all the hospital records.  There was no cardiac problem.  This is the second episode of some type of small bowel problem.  He was seen by surgery and it is felt that he should be followed medically.  It is of note that his only prior abdominal operation was a laparoscopic cholecystectomy.  He looks great today.  He's not having any chest pain or shortness of breath.  He has known very mild coronary disease.  His last exercise test was 2007.  Current Medications (verified): 1)  Carvedilol 12.5 Mg Tabs (Carvedilol) .... Take 1 Tablet By Mouth Twice A Day 2)  Warfarin Sodium 2.5 Mg Tabs (Warfarin Sodium) .... Take 1 Tablet By Mouth Daily Except 1 1/2 Tablets On T, Th, Sat 3)  Nitroglycerin 0.4 Mg Subl (Nitroglycerin) .... Place One Tablet Under Tongue Every 5 Minutes As Needed For Severe Chest Pain, Not To Exceed 3 in 15 Min Time Frame Place On File 4)  Simvastatin 20 Mg Tabs (Simvastatin) .... Take 1 Tablet By Mouth Once A Day 5)  Tamsulosin Hcl 0.4 Mg Caps (Tamsulosin Hcl) .... Take 1 Tablet By Mouth Once A Day 6)  Omeprazole 20 Mg Cpdr (Omeprazole) .... Take 1 Tablet By Mouth Once A Day As Needed 7)  Levothyroxine Sodium 50 Mcg Tabs (Levothyroxine Sodium) .... Take 1 Tablet By Mouth Once A Day  Allergies (verified): No Known Drug Allergies  Past History:  Past Medical History: FATIGUE / MALAISE (ICD-780.79) CAD  cath 2004...mild/mod nonobstuctive   /     nuclear...2007....small inferior scar ??, no ischemia ATRIAL FIBRILLATION GERD LA clot....small...in past EF  55-60%...echo.Marland Kitchen.02/2009 [facility]   mild..echo.Marland Kitchen.02/2009 MR   mild...echo...flat closure Elevated PSA Coumadin Rx Nausea vomiting and diarrhea... hospitalization... April, 2011.... probably viral (or partial small bowel obstruction)  /  Hospital... December, 2011... recurrent possible partial small bowel obstruction.... medical therapy Dizziness... mild... June, 2011.....stop digoxin... felt better Carotid bruit... soft.. right  Review of Systems       Patient denies fever, chills, headache, sweats, rash, change in vision, change in hearing, chest pain, cough, nausea vomiting, urinary symptoms.  All of the systems are reviewed and are negative.  Vital Signs:  Patient profile:   77 year old male Height:      68 inches Weight:      171 pounds BMI:     26.09 Pulse rate:   80 / minute Resp:     16 per minute BP sitting:   130 / 81  (left arm)  Vitals Entered By: Levora Angel, CNA (December 20, 2010 1:03 PM)  Physical Exam  General:  patient is stable. Head:  head is atraumatic. Eyes:  no xanthelasma. Neck:  no jugular venous distention..  Soft right carotid bruit. Chest Wall:  no chest wall tenderness. Lungs:  lungs are clear.  Respiratory effort is nonlabored. Heart:  cardiac exam reveals that the rhythm is irregularly irregular.  There is a soft systolic murmur. Abdomen:  abdomen is soft. Msk:  no musculoskeletal deformities. Extremities:  no peripheral edema. Skin:  no skin rash. Psych:  patient is oriented to  person time and place.  Affect is normal.   Impression & Recommendations:  Problem # 1:  CAROTID BRUIT, RIGHT (ICD-785.9)  The patient has very mild coronary disease.  There is a soft right carotid bruit.  We will obtain carotid Doppler.  I will  be in touch with him with the information.  Orders: Carotid Duplex (Carotid Duplex)  Problem # 2:  DIZZINESS (ICD-780.4) Has been no recurrent dizziness.  Problem # 3:  COUMADIN THERAPY (ICD-V58.61) Coumadin therapy continues.  We have no plans to switch him to other meds at this  time.  Problem # 4:  CAD, NATIVE VESSEL (ICD-414.01) There is mild coronary disease.  He has not had an exercise study since 2007.  I will consider that at the time of his next visit.  He did not have any labs today.  Problem # 5:  ATRIAL FIBRILLATION (ICD-427.31) Is chronic atrial fibrillation.  The rate is controlled.  He is on Coumadin.  No change in therapy.  6 month followup.  Patient Instructions: 1)  Your physician wants you to follow-up in: 6 months. You will receive a reminder letter in the mail one-two months in advance. If you don't receive a letter, please call our office to schedule the follow-up appointment. 2)  Your physician recommends that you continue on your current medications as directed. Please refer to the Current Medication list given to you today. 3)  Your physician has requested that you have a carotid duplex. This test is an ultrasound of the carotid arteries in your neck. It looks at blood flow through these arteries that supply the brain with blood. Allow one hour for this exam. There are no restrictions or special instructions.

## 2010-12-27 NOTE — Letter (Signed)
Summary: Discharge Summary/  [facility]  Discharge Summary/  [facility]   Imported By: Bartholomew Boards 12/20/2010 11:47:58  _____________________________________________________________________  External Attachment:    Type:   Image     Comment:   External Document

## 2010-12-27 NOTE — Miscellaneous (Signed)
  Clinical Lists Changes  Observations: Added new observation of PAST MED HX: FATIGUE / MALAISE (ICD-780.79) CAD  cath 2004...mild/mod nonobstuctive   /     nuclear...2007....small inferior scar ??, no ischemia ATRIAL FIBRILLATION GERD LA clot....small...in past EF  55-60%...echo.Marland Kitchen.02/2009 [facility]   mild..echo.Marland Kitchen.02/2009 MR  mild...echo...flat closure Elevated PSA Coumadin Rx Nausea vomiting and diarrhea... hospitalization... April, 2011.... probably viral (or partial small bowel obstruction)  /  Hospital... December, 2011... recurrent possible partial small bowel obstruction.... medical therapy Dizziness... mild... June, 2011.....stop digoxin... felt better (12/20/2010 12:01) Added new observation of PRIMARY MD: Consuello Masse, MD (12/20/2010 12:01)       Past History:  Past Medical History: FATIGUE / MALAISE (ICD-780.79) CAD  cath 2004...mild/mod nonobstuctive   /     nuclear...2007....small inferior scar ??, no ischemia ATRIAL FIBRILLATION GERD LA clot....small...in past EF  55-60%...echo.Marland Kitchen.02/2009 [facility]   mild..echo.Marland Kitchen.02/2009 MR  mild...echo...flat closure Elevated PSA Coumadin Rx Nausea vomiting and diarrhea... hospitalization... April, 2011.... probably viral (or partial small bowel obstruction)  /  Hospital... December, 2011... recurrent possible partial small bowel obstruction.... medical therapy Dizziness... mild... June, 2011.....stop digoxin... felt better

## 2010-12-27 NOTE — Medication Information (Signed)
Summary: ccr-lr  Anticoagulant Therapy  Managed by: Edrick Oh, RN PCP: Consuello Masse, MD Supervising MD: Dannielle Burn MD, Luvenia Heller Indication 1: Atrial Fibrillation (ICD-427.31) Lab Used: [facility] Site: [facility] INR POC 2.1 INR RANGE 1.7  Dietary changes: no    Health status changes: no    Bleeding/hemorrhagic complications: no    Recent/future hospitalizations: no    Any changes in medication regimen? no    Recent/future dental: no  Any missed doses?: no       Is patient compliant with meds? yes       Allergies: No Known Drug Allergies  Anticoagulation Management History:      The patient is taking warfarin and comes in today for a routine follow up visit.  The patient is taking warfarin for chronic atrial fibrillation.  Positive risk factors for bleeding include an age of 52 years or older.  The bleeding index is 'intermediate risk'.  Positive CHADS2 values include Age > 68 years old.  The start date was 10/12/2003.  Plans are to continue warfarin for life.  His last INR was 1.7.  Anticoagulation responsible provider: Dannielle Burn MD, Luvenia Heller.  INR POC: 2.1.  Cuvette Lot#: [id number].  Exp: 10/11.    Anticoagulation Management Assessment/Plan:      The patient's current anticoagulation dose is Warfarin sodium 2.5 mg tabs: Take 1 tablet by mouth daily except 1 1/2 tablets on T, Th, Sat.  The target INR is 2.0-3.0.  The next INR is due 01/17/2011.  Anticoagulation instructions were given to patient.  Results were reviewed/authorized by Edrick Oh, RN.  He was notified by Edrick Oh RN.         Prior Anticoagulation Instructions: INR 2.3 Continue coumadin 2.28m once daily except 3.784mon Tuesdays, Thursdays and Saturdays  Current Anticoagulation Instructions: INR 2.1 Continue coumadin 2.88m58mnce daily except 3.788m76m T,Th,Sat Prescriptions: WARFARIN SODIUM 2.5 MG TABS (WARFARIN SODIUM) Take 1 tablet by mouth daily except 1 1/2 tablets on T, Th, Sat  #45 x 3   Entered by:    LisaEdrick Oh  Authorized by:   JeffLorenza Evangelist, FACCWindsor Mill Surgery Center LLCigned by:   LisaEdrick Ohon 12/20/2010   Method used:   Electronically to        [facility]       [address]   [place]  [id number]   Ph: [phone]   Fax: [phone]:   [id number]

## 2010-12-27 NOTE — Medication Information (Signed)
Summary: [facility] D/C MEDICATION SHEET ORDER  [facility] D/C MEDICATION SHEET ORDER   Imported By: Delfino Lovett 12/20/2010 11:21:38  _____________________________________________________________________  External Attachment:    Type:   Image     Comment:   External Document

## 2010-12-27 NOTE — Consult Note (Signed)
Summary: Consultation Report/  SURGEON CONS DR. Truddie Hidden  Consultation Report/  SURGEON CONS DR. Truddie Hidden   Imported By: Bartholomew Boards 12/20/2010 11:52:22  _____________________________________________________________________  External Attachment:    Type:   Image     Comment:   External Document

## 2011-01-03 NOTE — Letter (Signed)
Summary: Risk analyst at [facility]. [address]   [place], [place] [address]   Phone: [phone]  Fax: [phone]        December 27, 2010 MRN: [id number]    [address] [place], [place]  [id number]    Dear Mr. CROMARTIE,  Your test ordered by Rande Lawman has been reviewed by your physician (or physician assistant) and was found to be normal or stable. Your physician (or physician assistant) felt no changes were needed at this time.  __X__ Echocardiogram  ____ Cardiac Stress Test  ____ Lab Work  ____ Peripheral vascular study of arms, legs or neck  ____ CT scan or X-ray  ____ Lung or Breathing test  ____ Other:   Thank you.   Gurney Maxin, RN, BSN    Bryon Lions, M.D., F.A.C.C. Maceo Pro, M.D., F.A.C.C. Cammy Copa, M.D., F.A.C.C. Vonda Antigua, M.D., F.A.C.C. Vita Barley, M.D., F.A.C.C. Mare Ferrari, M.D., F.A.C.C. Emi Belfast, PA-C

## 2011-01-09 ENCOUNTER — Encounter: Payer: Self-pay | Admitting: Cardiology

## 2011-01-09 DIAGNOSIS — Z7901 Long term (current) use of anticoagulants: Secondary | ICD-10-CM

## 2011-01-09 DIAGNOSIS — I4891 Unspecified atrial fibrillation: Secondary | ICD-10-CM

## 2011-01-17 ENCOUNTER — Ambulatory Visit (INDEPENDENT_AMBULATORY_CARE_PROVIDER_SITE_OTHER): Payer: MEDICARE | Admitting: *Deleted

## 2011-01-17 DIAGNOSIS — Z7901 Long term (current) use of anticoagulants: Secondary | ICD-10-CM

## 2011-01-17 DIAGNOSIS — I4891 Unspecified atrial fibrillation: Secondary | ICD-10-CM

## 2011-02-14 ENCOUNTER — Ambulatory Visit (INDEPENDENT_AMBULATORY_CARE_PROVIDER_SITE_OTHER): Payer: MEDICARE | Admitting: *Deleted

## 2011-02-14 DIAGNOSIS — Z7901 Long term (current) use of anticoagulants: Secondary | ICD-10-CM

## 2011-02-14 DIAGNOSIS — I4891 Unspecified atrial fibrillation: Secondary | ICD-10-CM

## 2011-02-14 LAB — POCT INR: INR: 2

## 2011-02-28 NOTE — Assessment & Plan Note (Signed)
[facility] OFFICE NOTE   KHAIDEN, SEGRETO                        MRN:          [id number]  DATE:02/23/2009                            DOB:          [birth year]    Mr. Prieto is seen for Cardiology followup.  The patient has known mild  coronary artery disease.  He has chronic atrial fibrillation.  We know  that he in the past had a small left atrial clot in his left atrial  appendage.  He remains on Coumadin.  He has never had an embolic event.  He does very well.  He is not having any chest pain or shortness of  breath.  However, he is having fatigue.  I believe this occurs more  after he has done significant activities.  He does not have any definite  signs of heart failure.  In reviewing his data, his last echo was done  in 2005.  He did have a Myoview scan in 2007.  There has been no syncope  or presyncope.   PAST MEDICAL HISTORY:   ALLERGIES:  No known drug allergies.   MEDICATIONS:  Omeprazole, simvastatin, digoxin, carvedilol, warfarin,  diltiazem, and fish oil.   OTHER MEDICAL PROBLEMS:  See the list below.   REVIEW OF SYSTEMS:  He is not having any fevers, chills, or skin rashes.  There is no headache.  There is no change in his vision or his hearing.  He does not have a cough.  There is no shortness of breath.  There is no  chest pain.  He has no GI or GU symptoms.  He does have a problem with  his right rotator cuff.  He hurt it with a strain actually while helping  his brother recently when his brother had a stroke.  The patient has had  full orthopedic evaluation with rehab and he continues to improve.  He  has no swelling.  All other systems are reviewed and are negative.   PHYSICAL EXAMINATION:  VITAL SIGNS:  Blood pressure is 135/82 with a  pulse of 54.  GENERAL:  The patient is oriented to person, time and place.  Affect is  normal.  He is here with his wife.  HEENT:  No xanthelasma.   He has normal extraocular motion.  NECK:  There are no carotid bruits.  There is no jugular venous  distention.  LUNGS:  Clear.  Respiratory effort is not labored.  CARDIAC:  Rhythm is irregularly irregular.  There are no significant  murmurs.  ABDOMEN:  Soft.  There are no masses or bruits.  EXTREMITIES:  He has no significant peripheral edema.  MUSCULOSKELETAL:  There are no musculoskeletal deformities.   EKG is poor quality, but adequate.  He has atrial fibrillation and there  is no significant change in his QRS.   PROBLEMS:  #1.  History of elevated prostate-specific antigen followed  by Dr. Quintin Alto  #2.  Chronic atrial fibrillation on Coumadin.  His rate is controlled.  There is no reason to try to cardiovert at this time.  He does  not have  significant symptoms from his atrial fibrillation.  #3.  Coumadin therapy.  This is to be continued.  #4.  History of mild coronary artery disease.  #5.  Gastroesophageal reflux disease.  #6.  History of a small left atrial appendage clot and he remains on  Coumadin indefinitely.  #7.  Currently some fatigue after he is active.  At this point, I am not  sure that this is an anginal equivalent or a sign of heart failure.  However it is time to reassess his left ventricular function.  In the  past, there had been some left ventricular dysfunction, but over time  this appeared to have improved to an ejection fraction in the 50-55%  range.  Two-dimensional echo will be done.   The patient did have an EKG as mentioned above.  I personally reviewed  it as outlined.  I had a full discussion with the patient about all of  his issues.  His echo will be done and I will plan to see him back in 1  year unless we see some unusual finding with his echo.     Carlena Bjornstad, MD, [facility]  Electronically Signed    JDK/MedQ  DD: 02/23/2009  DT: 02/24/2009  Job #: [id number]   cc:   Consuello Masse

## 2011-02-28 NOTE — Assessment & Plan Note (Signed)
[facility] OFFICE NOTE   ARYA, BOXLEY                        MRN:          [id number]  DATE:02/18/2008                            DOB:          [birth year]    Mr. Bouffard is doing very well.  He is here with his wife today.  He does  not have any chest pain.  There has been no syncope or presyncope.  His  last Cardiolite was in February 2007.  He had a small inferior defect  but no reversibility.  He has no chest pain.  He does have chronic  atrial fibrillation and his rate is controlled.  His rate is on the slow  side today, but I am not concerned about this.   PAST MEDICAL HISTORY:  Allergies:  No known drug allergies.   Medications:  1. Diltiazem 180 mg b.i.d.  If we think that he is having any      symptomatic bradycardia, we can lower his diltiazem dose.  I am not      inclined to do that as of today.  2. Coumadin.  3. Coreg 12.5 mg b.i.d.  4. Digoxin.  Another option would be to stop his digoxin.  5. Fish oil.  6. Protonix.  7. Lipitor 5 mg.   Other medical problems:  See the list below.   REVIEW OF SYSTEMS:  He is really feeling well and his review of systems  is negative.   PHYSICAL EXAM:  Blood pressure is 110/68.  His pulse is 50.  The patient is oriented to person, time and place.  Affect is normal.  HEENT:  No xanthelasma.  He has normal extraocular motion.  There are no carotid bruits.  There is no jugular venous distention.  LUNGS:  Clear.  Respiratory effort is not labored.  CARDIAC:  An S1 with an S2.  There are no clicks or significant murmurs.  ABDOMEN:  Soft.  He has no peripheral edema.   EKG reveals atrial fibrillation with a relatively slow rate.   PROBLEMS:  1. Elevated prostate-specific antigen in the past, treated by Dr.      Quintin Alto and followed.  He has received antibiotics for prostatitis.  2. Chronic atrial fibrillation on Coumadin.  Rate is controlled.  It  is on the slow side but stable.  3. Long-term Coumadin.  4. Mild coronary disease.  This is stable.  5. Gastroesophageal reflux disease.  6. History of a small left atrial appendage clot, and he remains on      Coumadin indefinitely.  7. Ejection fraction 55%.   He is stable.  I will see him back in 1 year.     Carlena Bjornstad, MD, [facility]  Electronically Signed    JDK/MedQ  DD: 02/18/2008  DT: 02/18/2008  Job #: [id number]   cc:   Consuello Masse

## 2011-03-03 NOTE — Cardiovascular Report (Signed)
NAME:  [name], [name] NO.:  [id number]   MEDICAL RECORD NO.:  [id number]                   PATIENT TYPE:  OIB   LOCATION:  2889                                 FACILITY:  [facility]   PHYSICIAN:  Ethelle Lyon, M.D.             DATE OF BIRTH:  Aug 02, 1931   DATE OF PROCEDURE:  10/05/2003  DATE OF DISCHARGE:                              CARDIAC CATHETERIZATION   PROCEDURE:  Left heart catheterization, left ventriculography, coronary  angiography.   INDICATION:  [name] is a 77 year old gentleman who presents with  exertional dyspnea of indeterminate duration.  He saw Dr. Quintin Alto last week  and was found to be in atrial fibrillation with rapid ventricular response.  Diltiazem and Coumadin were initiated.  The following day he saw Dr. Ron Parker  who performed echocardiogram demonstrating overall preserved left  ventricular systolic function, but lateral hypokinesis.  Dr. Ron Parker therefore  scheduled him for cardiac catheterization.  The patient denies any chest  discomfort.  The patient has remained in rapid atrial fibrillation with  heart rate of approximately 140 on presentation today.  Coumadin was stopped  by Dr. Ron Parker.   PROCEDURAL TECHNIQUE:  Informed consent was obtained.  Under 1% lidocaine  local anesthesia, a 6 French sheath was placed in the right femoral artery  using the modified Seldinger technique.  Diagnostic angiography and  ventriculography were performed using JL-4, JR-4, and pigtail catheters.  The patient tolerated the procedure well and was transferred to the holding  room in stable condition.  Sheaths are to be removed there.   COMPLICATIONS:  None.   FINDINGS:  1. LV:  127/18/31. EF approximately 30% with mild inferior and apical     hypokinesis.  2. 1+ mitral regurgitation.  No aortic stenosis.  3. Left main:  Angiographically normal.  4. LAD:  The LAD is a moderate size vessel giving rise to a single moderate     size diagonal  branch.  The mid LAD has a 30% stenosis.  5. Circumflex:  The circumflex is a moderate-size vessel giving rise to     single obtuse marginal.  There is a 50% stenosis proximally. al.  6. RCA:  The RCA is a relatively large dominant vessel.  There is a 50%     stenosis of the mid vessel.  The PDA has a 60-70% ostial stenosis in a     2.25 mm vessel.  There is a further 50% stenosis down stream in the PDA.   IMPRESSION/RECOMMENDATIONS:  1. Moderate coronary artery disease with no symptoms of myocardial ischemia.  2. Mildly decreased left ventricular systolic function with questionable     inferior hypokinesis.  3. Rapid atrial fibrillation.   I will admit him for improvement in rate control and initiation of  anticoagulation.  Will plan medical therapy for his coronary disease.  Lipid  profile will be checked and statin initiated.  He will  be placed on IV  Diltiazem and oral beta blocker added to his oral calcium channel blocker.                                               Ethelle Lyon, M.D.    WED/MEDQ  D:  10/05/2003  T:  10/05/2003  Job:  [id number]   cc:   Dola Argyle, M.D.   Consuello Masse  [place]  [place] [address]  Fax: [phone]

## 2011-03-03 NOTE — Assessment & Plan Note (Signed)
[facility] OFFICE NOTE   DARROW, BARREIRO                        MRN:          [id number]  DATE:01/21/2007                            DOB:          [birth year]    HISTORY OF PRESENT ILLNESS:  Mr. Tafoya is doing very well. He has  chronic atrial fibrillation and mild coronary disease. He had had some  chest discomfort and we eventually felt that it was related to an onion  that he had eaten. He has been stable. His last Cardiolite was done in  February 2007, showing some inferior defect but no reversibility. He is  doing well. He is not having chest pain. He is actively exercising. I am  very pleased with his progress.   ALLERGIES:  NO KNOWN DRUG ALLERGIES.   MEDICATIONS:  Diltiazem 180 b.i.d., Coumadin as directed, Coreg 12.5  b.i.d., Digoxin 0.125, fish oil, Protonix, Naprosyn, and Lipitor 5. Dr.  Quintin Alto has adjusted his dose down. We would like to keep his LDL in the  range of 80.   PAST MEDICAL HISTORY:  See the list below.   REVIEW OF SYSTEMS:  He feels great and has no complaints. His review of  systems is negative.   PHYSICAL EXAMINATION:  VITAL SIGNS:  Blood pressure 137/77 with a pulse  of 50. The patient weighs 172 pounds.  GENERAL:  The patient is here with his wife today. He is oriented to  person, place, and time and his affect is normal.  HEENT:  Reveals no xanthelasma.  NECK:  He has no carotid bruits. There is normal extraocular motion. He  has no jugular venous distention.  LUNGS:  Clear. Respiratory effort is not labored.  CARDIAC:  Examination reveals S1 and S2. There are no clicks or  significant murmurs.  ABDOMEN:  Soft.  EXTREMITIES:  He has no peripheral edema.   LABORATORY/DIAGNOSTIC STUDIES:  EKG shows no change. He has slow atrial  fibrillation and some decreased voltage.   PROBLEM LIST:  1. History of elevated PSA in the past, treated with antibiotics by      Dr.  Quintin Alto.  2. Chronic atrial fibrillation, on Coumadin. We will not change any of      his rate control medications. He has no had syncope or pre-syncope.  3. Long term Coumadin. I will readjust his dose today.  4. Mild coronary disease. No further workup needed now but careful      attention to secondary prevention.  5. Gastroesophageal reflux disease.  6. History of a small left atrial appendage clot and he remains on      Coumadin indefinitely.  7. Ejection fraction of 55%.   PLAN:  Mr. Earwood is stable. I have discussed all of the issues at  length with the patient and his wife. He asked me about how much  exercise he can do, specifically as it relates to weights, in a program  that he is in. I made it clear that his weights need to be used in an  aerobic fashion, with no heavy weights and no  heavy straining.   FOLLOWUP:  I will see him back in 1 year for cardiology followup.     Carlena Bjornstad, MD, [facility]  Electronically Signed    JDK/MedQ  DD: 01/21/2007  DT: 01/21/2007  Job #: [id number]   cc:   Consuello Masse

## 2011-03-03 NOTE — Discharge Summary (Signed)
NAME:  Anthony, [name]                           ACCOUNT NO.:  [id number]   MEDICAL RECORD NO.:  [id number]                   PATIENT TYPE:  INP   LOCATION:  6979                                 FACILITY:  [facility]   PHYSICIAN:  Ethelle Lyon, M.D.             DATE OF BIRTH:  04/30/1931   DATE OF ADMISSION:  10/05/2003  DATE OF DISCHARGE:  10/08/2003                           DISCHARGE SUMMARY - REFERRING   DISCHARGE DIAGNOSES:  1. Atrial fibrillation, rate controlled.  2. Long-term Coumadin therapy.  3. Coronary artery disease, medical therapy.  4. Small left atrial appendage clot.  5. Ejection fraction 35%, left ventricular dysfunction.  6. Hyperlipidemia, treated.   HISTORY OF PRESENT ILLNESS:  [name] is a 77 year old male patient of Dr.  Quintin Alto who had seen Dr. Quintin Alto on September 30, 2003 and mentioned shortness  of breath.  An EKG revealed rapid atrial fibrillation.  He was started on  Diltiazem and then a 2D echocardiogram was performed.  Dr. Ron Parker reviewed  this with the patient and this revealed decreased motion in the lateral wall  in the apex.  The ejection fraction was around 40 to 45%.  A TSH had also  been drawn by Dr. Quintin Alto and this was normal.  At this point it was felt  that the patient should be set up for heart catheterization.  The patient  came in to the hospital on October 05, 2003 under the care of Dr. Sabino Snipes.   HOSPITAL COURSE:  Catheterization revealed:  Mid 50% RCA lesion.  The PDA  has a 60 to 70% ostial stenosis with a further 50% stenosis down stream with  a small 2.25 mm vessel.  There was a mid 30% LAD lesion and a proximal 50%  circumflex lesion.  The left ventriculogram revealed an EF of 50% with  inferior hypokinesis, 1+ mitral regurgitation, no aortic stenosis.  The  patient was noted to be in rapid atrial fibrillation with a heart rate of  around 150 and he was admitted for better rate control.   Subsequently the patient underwent  a TEE and this revealed a small left  atrial appendage clot.  He was maintained on Coumadin as well as IV heparin  and by the date of discharge on October 08, 2003 his INR was 2.6.  He was  eventually discharged that day in stable condition.   DISCHARGE MEDICATIONS:  1. Cardizem CD 180 mg b.i.d.  2. Coumadin 2.5 mg one tablet daily.  3. Lipitor 10 mg q.h.s.  4. Coreg 12.5 mg b.i.d.  5. Nitroglycerin sublingual p.r.n. chest pain.  6. Tylenol p.r.n.   DISCHARGE INSTRUCTIONS:  1. No strenuous activity x2 days then gradually increase activity.  2. Remain on a low fat diet.  3. May clean cath site with soap and water, call for questions or concerns.  4. Appointment at the [facility] on October 12, 2003 at  10 a.m.  5. Follow up with Dr. Ron Parker in the [facility] office on October 21, 2003 at 10 a.m.   LABORATORY DATA:  White count was 9200, hemoglobin 12.9, hematocrit 37.6.  Total cholesterol 117, triglycerides 67, HDL 31, LDL 73.      Joesphine Bare, P.A. [facility]                      Ethelle Lyon, M.D.    LB/MEDQ  D:  12/15/2003  T:  12/16/2003  Job:  [id number]   cc:   Consuello Masse  [place]  [place] [address]  Fax: [phone]

## 2011-03-03 NOTE — Letter (Signed)
July 27, 2006     [facility]  Attn:  Mr. Donella Stade, Underwriting [place] [address]   RE:  [name], [name]  MRN:  [id number]  /  DOB:  27-Nov-1930   This concerns Mr. [name], application policy #[id number].   Dear Mr. [name]:   I am writing to support the application for insurance for Mr. [name].   I am a Soil scientist cardiologist and I have followed Mr. [name] for  several years.  He is very stable.  He goes about full activities.  The  patient does have atrial fibrillation.  The rate is well controlled.  He is  treated with Coumadin and he has been quite stable.  This is a stable  situation for him and he is doing very well.  The patient also does have  mild coronary artery disease.  Catheterization in the past showed a 60-70%  lesion in the ostium of his posterior descending coronary artery.  He has  had a Cardiolite since then.  It was done in February, 2007.  This exercise  test revealed that the patient had no significant ischemia and his ejection  fraction is normal at 55%.   This data all shows that the patient is very stable.  He has very stable  mild coronary artery disease and very stable atrial fibrillation.  He has an  excellent long term outlook.  He is treated very aggressively for secondary  prevention of his coronary disease.  He takes his medications reliably.  He  exercises on a regular basis.  He is on Lipitor for his coronary disease.  He also uses Omega 3 fish oil.   Once again, the purpose of this letter is to document the fact that I feel  strongly that Mr. [name] is quite stable.  He is a good candidate, at his  age, for life insurance.  Please do no hesitate to contact me for any  questions.    Sincerely,     ______________________________  Carlena Bjornstad, MD, [facility]    JDK/MedQ  /  Job #:  [id number]  DD:  07/27/2006 / DT:  07/27/2006   CC:   [place]

## 2011-03-14 ENCOUNTER — Encounter: Payer: MEDICARE | Admitting: *Deleted

## 2011-03-17 ENCOUNTER — Ambulatory Visit (INDEPENDENT_AMBULATORY_CARE_PROVIDER_SITE_OTHER): Payer: Medicare Other | Admitting: *Deleted

## 2011-03-17 DIAGNOSIS — Z7901 Long term (current) use of anticoagulants: Secondary | ICD-10-CM

## 2011-03-17 DIAGNOSIS — I4891 Unspecified atrial fibrillation: Secondary | ICD-10-CM

## 2011-04-11 ENCOUNTER — Encounter: Payer: Self-pay | Admitting: Cardiology

## 2011-04-11 DIAGNOSIS — R0989 Other specified symptoms and signs involving the circulatory and respiratory systems: Secondary | ICD-10-CM | POA: Insufficient documentation

## 2011-04-18 ENCOUNTER — Ambulatory Visit (INDEPENDENT_AMBULATORY_CARE_PROVIDER_SITE_OTHER): Payer: Medicare Other | Admitting: *Deleted

## 2011-04-18 DIAGNOSIS — Z7901 Long term (current) use of anticoagulants: Secondary | ICD-10-CM

## 2011-04-18 DIAGNOSIS — I4891 Unspecified atrial fibrillation: Secondary | ICD-10-CM

## 2011-05-09 ENCOUNTER — Other Ambulatory Visit: Payer: Self-pay | Admitting: Cardiology

## 2011-05-23 ENCOUNTER — Ambulatory Visit (INDEPENDENT_AMBULATORY_CARE_PROVIDER_SITE_OTHER): Payer: Medicare Other | Admitting: *Deleted

## 2011-05-23 DIAGNOSIS — I4891 Unspecified atrial fibrillation: Secondary | ICD-10-CM

## 2011-05-23 DIAGNOSIS — Z7901 Long term (current) use of anticoagulants: Secondary | ICD-10-CM

## 2011-06-27 ENCOUNTER — Ambulatory Visit (INDEPENDENT_AMBULATORY_CARE_PROVIDER_SITE_OTHER): Payer: Medicare Other | Admitting: *Deleted

## 2011-06-27 DIAGNOSIS — I4891 Unspecified atrial fibrillation: Secondary | ICD-10-CM

## 2011-06-27 DIAGNOSIS — Z7901 Long term (current) use of anticoagulants: Secondary | ICD-10-CM

## 2011-07-17 ENCOUNTER — Encounter: Payer: Self-pay | Admitting: Cardiology

## 2011-07-19 ENCOUNTER — Encounter: Payer: Self-pay | Admitting: Cardiology

## 2011-07-19 DIAGNOSIS — I4891 Unspecified atrial fibrillation: Secondary | ICD-10-CM | POA: Insufficient documentation

## 2011-07-19 DIAGNOSIS — Z7901 Long term (current) use of anticoagulants: Secondary | ICD-10-CM | POA: Insufficient documentation

## 2011-07-19 DIAGNOSIS — I340 Nonrheumatic mitral (valve) insufficiency: Secondary | ICD-10-CM | POA: Insufficient documentation

## 2011-07-19 DIAGNOSIS — K219 Gastro-esophageal reflux disease without esophagitis: Secondary | ICD-10-CM | POA: Insufficient documentation

## 2011-07-19 DIAGNOSIS — R5383 Other fatigue: Secondary | ICD-10-CM | POA: Insufficient documentation

## 2011-07-19 DIAGNOSIS — I8290 Acute embolism and thrombosis of unspecified vein: Secondary | ICD-10-CM | POA: Insufficient documentation

## 2011-07-19 DIAGNOSIS — I2510 Atherosclerotic heart disease of native coronary artery without angina pectoris: Secondary | ICD-10-CM | POA: Insufficient documentation

## 2011-07-21 ENCOUNTER — Encounter: Payer: Self-pay | Admitting: *Deleted

## 2011-07-21 ENCOUNTER — Telehealth: Payer: Self-pay | Admitting: *Deleted

## 2011-07-21 ENCOUNTER — Other Ambulatory Visit: Payer: Self-pay | Admitting: Cardiology

## 2011-07-21 ENCOUNTER — Encounter: Payer: Self-pay | Admitting: Cardiology

## 2011-07-21 ENCOUNTER — Ambulatory Visit (INDEPENDENT_AMBULATORY_CARE_PROVIDER_SITE_OTHER): Payer: Medicare Other | Admitting: Cardiology

## 2011-07-21 ENCOUNTER — Ambulatory Visit (INDEPENDENT_AMBULATORY_CARE_PROVIDER_SITE_OTHER): Payer: Medicare Other | Admitting: *Deleted

## 2011-07-21 DIAGNOSIS — R42 Dizziness and giddiness: Secondary | ICD-10-CM

## 2011-07-21 DIAGNOSIS — Z7901 Long term (current) use of anticoagulants: Secondary | ICD-10-CM

## 2011-07-21 DIAGNOSIS — R0989 Other specified symptoms and signs involving the circulatory and respiratory systems: Secondary | ICD-10-CM

## 2011-07-21 DIAGNOSIS — I2510 Atherosclerotic heart disease of native coronary artery without angina pectoris: Secondary | ICD-10-CM

## 2011-07-21 DIAGNOSIS — I4891 Unspecified atrial fibrillation: Secondary | ICD-10-CM

## 2011-07-21 NOTE — Patient Instructions (Signed)
   Lexiscan Cardiolite (stress test) If the results of your test are normal or stable, you will receive a letter.  If they are abnormal, the nurse will contact you by phone. Your physician wants you to follow up in: 6 months.  You will receive a reminder letter in the mail one-two months in advance.  If you don't receive a letter, please call our office to schedule the follow up appointment

## 2011-07-21 NOTE — Assessment & Plan Note (Signed)
When I saw him last there seemed to be a soft carotid bruit.  Doppler was done and he has no significant carotid disease.  No further workup is needed.

## 2011-07-21 NOTE — Assessment & Plan Note (Signed)
He continues on Coumadin.  No change in therapy.

## 2011-07-21 NOTE — Assessment & Plan Note (Signed)
The patient is stable today.  His last exercise test was done in 2007. He did have some recent chest discomfort which seemed not to be cardiac.  It has now been 5 years since his last study.  We will proceed with a pharmacologic stress nuclear scan.  He does have known disease.

## 2011-07-21 NOTE — Telephone Encounter (Signed)
July 25, 2011

## 2011-07-21 NOTE — Telephone Encounter (Signed)
AUTH # [id number]  EXP 09/04/11

## 2011-07-21 NOTE — Assessment & Plan Note (Signed)
He has not had any recurrent significant dizziness.  No change in therapy.

## 2011-07-21 NOTE — Telephone Encounter (Signed)
When is the test scheduled?  10/20?

## 2011-07-21 NOTE — Progress Notes (Signed)
HPI Patient is seen today for followup atrial fibrillation and CAD.  He had one day recently with some chest discomfort.  It was not classic angina he was seen in the emergency room.  It was felt to not be cardiac in origin and he was allowed to go home.  He has done well since then and he has been active.  We know that he had very mild coronary disease in the past.  His last exercise test was 2007.  As part of today's evaluation I have carefully reviewed My old records and completely updated new EMR.  After I saw the patient last in March, 2012 arrange for carotid Doppler.  This was done December 23, 2010 there was no significant carotid stenoses.  No Known Allergies  Current Outpatient Prescriptions  Medication Sig Dispense Refill  . carvedilol (COREG) 12.5 MG tablet Take 12.5 mg by mouth 2 (two) times daily.        Marland Kitchen diltiazem (CARDIZEM CD) 180 MG 24 hr capsule Take 180 mg by mouth daily.        . fish oil-omega-3 fatty acids 1000 MG capsule Take 250 mg by mouth daily.        Marland Kitchen levothyroxine (SYNTHROID, LEVOTHROID) 50 MCG tablet Take 50 mcg by mouth daily.        . magnesium gluconate (MAGONATE) 500 MG tablet Take 500 mg by mouth 1 dose over 46 hours.        . Melatonin 1 MG CAPS Take by mouth.        . nitroGLYCERIN (NITROSTAT) 0.4 MG SL tablet Place 0.4 mg under the tongue every 5 (five) minutes as needed. May repeat for up to 3 doses.       Marland Kitchen omeprazole (PRILOSEC) 20 MG capsule Take 20 mg by mouth daily.        . simvastatin (ZOCOR) 10 MG tablet Take 10 mg by mouth at bedtime.        . Tamsulosin HCl (FLOMAX) 0.4 MG CAPS Take 0.4 mg by mouth daily.        Marland Kitchen warfarin (COUMADIN) 2.5 MG tablet TAKE 1 & 1/2 TABLETS ON TUES, THURS, & SAT. ALL OTHER DAYS TAKE 1 TABLET  45 tablet  3    History   Social History  . Marital Status: Married    Spouse Name: N/A    Number of Children: N/A  . Years of Education: N/A   Occupational History  . Retired    Social History Main Topics  . Smoking  status: Never Smoker   . Smokeless tobacco: Not on file  . Alcohol Use: Not on file  . Drug Use: Not on file  . Sexually Active: Not on file   Other Topics Concern  . Not on file   Social History Narrative   Married    No family history on file.  Past Medical History  Diagnosis Date  . Other malaise and fatigue   . CAD (coronary artery disease)     Catheterization 2004, mild/moderate nonobstructive disease  /   nuclear, 2007, small inferior scar // no ischemia  . Atrial fibrillation     Permanent  . GERD (gastroesophageal reflux disease)   . Clot     LA, small, in past  . Ejection fraction 02/2009    EF 55-60%,Echo, May, 2010  . LVH (left ventricular hypertrophy) 02/2009     Mild, echo, 2010  . Mitral regurgitation     Mild, echo, flat closure  .  Elevated PSA   . Nausea vomiting and diarrhea 01/2010    Hospitalization, probably viral (or partial small bowel obstruction)/ Hospital 09/2010, recurrent possible partial small bowel obstruction, medical therapy  . Dizziness 03/2010    June, 2011   Mild, stop digoxin, felt better  . Carotid bruit     Soft, right  . Warfarin anticoagulation     Atrial fibrillation    Past Surgical History  Procedure Date  . Cardiac catheterization 2004    ROS  Patient denies fever, chills, headache, sweats, rash, change in vision, change in hearing, cough, nausea vomiting, urinary symptoms.  All other systems are reviewed and are negative. PHYSICAL EXAM Patient is stable today.  He is here with his wife.  He is oriented to person time and place.  Affect is normal.  Head is atraumatic.  There is no jugular venous distention.  Lungs are clear.  Respiratory effort is not labored.  Cardiac exam reveals a home and S2.  No clicks or significant murmurs.  The rhythm is irregularly irregular.  Abdomen is soft there is no peripheral edema.  There are no musculoskeletal deformities. Filed Vitals:   07/21/11 0949  BP: 119/73  Pulse: 93  Resp: 16    Height: 5' 8"  (1.727 m)  Weight: 169 lb (76.658 kg)    EKG is not done today.  I reviewed the EKG from June 05, 2011.  This was done at the hospital.  I have compared this with prior tracings.  There is atrial fibrillation.  There is old decreased anterior R-wave progression and there is low voltage precordial leads.  There is no significant change.  ASSESSMENT & PLAN

## 2011-07-21 NOTE — Telephone Encounter (Signed)
[name] FOR 07-18-2011 @ [facility] hospital Checking percert.

## 2011-07-25 DIAGNOSIS — I2510 Atherosclerotic heart disease of native coronary artery without angina pectoris: Secondary | ICD-10-CM

## 2011-07-27 ENCOUNTER — Encounter: Payer: Self-pay | Admitting: Cardiology

## 2011-07-27 ENCOUNTER — Encounter: Payer: Self-pay | Admitting: *Deleted

## 2011-08-18 ENCOUNTER — Ambulatory Visit (INDEPENDENT_AMBULATORY_CARE_PROVIDER_SITE_OTHER): Payer: Medicare Other | Admitting: *Deleted

## 2011-08-18 DIAGNOSIS — Z7901 Long term (current) use of anticoagulants: Secondary | ICD-10-CM

## 2011-08-18 DIAGNOSIS — I4891 Unspecified atrial fibrillation: Secondary | ICD-10-CM

## 2011-09-15 ENCOUNTER — Ambulatory Visit (INDEPENDENT_AMBULATORY_CARE_PROVIDER_SITE_OTHER): Payer: Medicare Other | Admitting: *Deleted

## 2011-09-15 DIAGNOSIS — I4891 Unspecified atrial fibrillation: Secondary | ICD-10-CM

## 2011-09-15 DIAGNOSIS — Z7901 Long term (current) use of anticoagulants: Secondary | ICD-10-CM

## 2011-09-18 ENCOUNTER — Encounter (INDEPENDENT_AMBULATORY_CARE_PROVIDER_SITE_OTHER): Payer: Self-pay | Admitting: Internal Medicine

## 2011-09-18 ENCOUNTER — Ambulatory Visit (INDEPENDENT_AMBULATORY_CARE_PROVIDER_SITE_OTHER): Payer: Medicare Other | Admitting: Internal Medicine

## 2011-09-18 ENCOUNTER — Other Ambulatory Visit (INDEPENDENT_AMBULATORY_CARE_PROVIDER_SITE_OTHER): Payer: Self-pay | Admitting: *Deleted

## 2011-09-18 ENCOUNTER — Encounter (INDEPENDENT_AMBULATORY_CARE_PROVIDER_SITE_OTHER): Payer: Self-pay | Admitting: *Deleted

## 2011-09-18 VITALS — BP 116/70 | HR 68 | Temp 98.1°F | Resp 14 | Ht 68.0 in | Wt 170.0 lb

## 2011-09-18 DIAGNOSIS — K56609 Unspecified intestinal obstruction, unspecified as to partial versus complete obstruction: Secondary | ICD-10-CM

## 2011-09-18 DIAGNOSIS — Z8719 Personal history of other diseases of the digestive system: Secondary | ICD-10-CM | POA: Insufficient documentation

## 2011-09-18 NOTE — Patient Instructions (Signed)
Preliminary test with Agile capsule to be scheduled in first week of Jan,2013; to be followed by given capsule study;unless agile capsule fails to pass through small bowel. Notify if symptoms relapse in the meantime.

## 2011-09-18 NOTE — Consult Note (Signed)
Job [id number]

## 2011-09-19 NOTE — Consult Note (Signed)
[name], [name] NO.:  [id number]  MEDICAL RECORD NO.:  [id number]  LOCATION:                                 FACILITY:  PHYSICIAN:  Hildred Laser, M.D.    DATE OF BIRTH:  1931/09/05  DATE OF CONSULTATION:  09/18/2011 DATE OF DISCHARGE:                                CONSULTATION   CONSULTING PHYSICIAN:  Consuello Masse, MD  REASON FOR CONSULTATION:  Recurrent bouts of small-bowel obstruction. The patient referred for further evaluation question given capsule study.  HISTORY OF PRESENT ILLNESS:  [name] is a 77 year old Caucasian male who is referred through courtesy of Dr. Consuello Masse for GI evaluation.  He was admitted to [facility] in [place] on August 30, 2011, with nausea and vomiting. Workup revealed small-bowel obstruction.  He improved with conservative therapy.  He was also seen in consultation by Dr. Ermelinda Das of Surgical Service.  Abdominopelvic CT was obtained.  This study showed moderate dilation of proximal and mid small bowel with abrupt transition in right lower quadrant just anterior to the right common iliac artery. There was no mass or inflammatory process identified in this area. Incidental finding of small cyst in the spleen.  The patient improved and was discharged.  He went home on September 03, 2011, and he has not had any problems since then. The patient had more or less similar episode in August 2009.  He was felt to have small-bowel obstruction.  He had another episode in June 2010 and he was just treated in emergency room.  At that time, he was felt to have symptomatic cholelithiasis and went on to have cholecystectomy in December 2010.  In April 2011, he was admitted with nausea and diarrhea and briefly hospitalized and felt to have gastroenteritis.  He was admitted again in August 2011, and this time, he was diagnosed with small-bowel obstruction and he had NG tube for decompression and he had a full recovery.  He was seen at that  time, he had small-bowel follow-through which was within normal limits.  He was readmitted on September 15, 2010, and again was felt to have a small-bowel obstruction and treated with NG tube compression.  Following that hospitalization, he was seen by Dr. Keane Police and he felt that there was nothing that required him to have surgery.  Finally, he was back in the emergency room in July this year with nausea, but no vomiting and was treated and discharged.  [name] states that between these spells, he has no symptoms.  His wife thinks that his 3 major episodes that may have been triggered by eating at restaurant in [place].  He has good appetite.  He denies weight loss, melena or rectal bleeding.  His bowels generally move once or twice daily.  His heartburn is well controlled with therapy.  CURRENT MEDICATIONS: 1. Carvedilol 12.5 mg p.o. b.i.d. 2. Diltiazem 180 mg p.o. daily. 3. Fish oil 250 mg p.o. daily. 4. Levothyroxine 50 mcg p.o. daily. 5. Magnesium gluconate 500 mg p.o. daily. 6. Nitrostat 0.4 mg sublingual p.r.n. chest pain. 7. Omeprazole 20 mg p.o. daily. 8. Simvastatin 10 mg p.o. daily. 9. Tamsulosin 0.4 mg p.o. daily.  10.Warfarin 2.5 mg 3 days each week and 3.75 mg 4 days each week.  PAST MEDICAL HISTORY:  He was diagnosed with atrial fibrillation in 2007.  He was initially found to have small clot in his LA, therefore cardioversion not undertaken.  He has a good LV function.  Hypothyroidism was diagnosed 3 years ago.  Chronic GERD was diagnosed about 2 years ago.  History of elevated PSA, but his last PSA according to the patient was normal.  He had hypothyroidism.  His last screening colonoscopy was in September 2008 and was unremarkable, but I do not have the actual report.  ALLERGIES:  NK.  FAMILY HISTORY:  Mother was diagnosed with colon carcinoma at age 54 and she died at age 66.  His father whom he never knew died of MI at age 37. He has 2 brothers living,  1 died of MI at age 73, another brother has atrial fibrillation and 1 recently suffered mild CVA.  SOCIAL HISTORY:  He is married.  He has 1 daughter in good health.  He worked at [employer] for 34 years and for 5 years with [employer] before his retirement.  He retired in 2000.  He has never smoked cigarettes or drank alcohol.  OBJECTIVE:  VITAL SIGNS:  Weight 170 pounds, he is 68 inches tall. Pulse is 68 per minute and regular, blood pressure 116/70, respiratory rate is 14 and temp is 98.1. EYES:  Conjunctivae are pink.  Sclerae are nonicteric. MOUTH:  Oropharyngeal mucosa is normal. NECK:  No neck masses or thyromegaly noted. CARDIAC:  With regular rhythm, normal S1 and S2.  No murmur or gallop noted. LUNGS:  Clear to auscultation. ABDOMEN:  Symmetrical.  Bowel sounds are normal.  No bruits noted.  On palpation, soft abdomen without tenderness, organomegaly or masses. RECTAL:  Deferred as he had one in October revealing guaiac negative stool. EXTREMITIES:  No peripheral edema or clubbing noted.  Acute abdominal film is available for review and it shows dilated loops of small bowel with multiple air-fluid levels.  CT report is available for review and as above.  ASSESSMENT:  [name] is a 77 year old Caucasian male who was recently hospitalized at [facility] in [place] for small-bowel obstruction and responded to medical therapy.  He has had at least 2 well-documented similar episodes and possibly few more.  He had another CT during his prior admission that was unremarkable and he had a normal small bowel follow-through. He does not have any alarm symptoms.  Given his clinical course, it is given that this will happen again.  He could have subtle small bowel stricture not picked up on imaging studies.  He could also be having intermittent ischemic injury for that matter a small tumor.  In this situation, it would be reasonable to proceed with small bowel Given capsule study.   Since there is a risk of stricture, we will need to study with Agile capsule to make sure that it can pass through his small bowel before he is allowed to swallow Given capsule.  RECOMMENDATIONS:  We will schedule the patient for initial study with Agile capsule and once he is documented to have passed through small bowel, he will return for Given capsule study.  He will have plain abdominal film 30 hours after swallowing Agile capsule followed by a Given capsule study unless there is contraindication.  I have reviewed the procedure risks with the patient and his wife and they are agreeable.  He would like to wait until  after the holidays and I do not see any problems with it.  In the meantime, should the patient develop any symptoms, he will either get in contact with Dr. Quintin Alto or myself.  We appreciate the opportunity to participate in the care of this gentleman.          ______________________________ Hildred Laser, M.D.     NR/MEDQ  D:  09/18/2011  T:  09/18/2011  Job:  [id number]  cc:   Consuello Masse, MD Fax: [phone]

## 2011-10-05 ENCOUNTER — Other Ambulatory Visit: Payer: Self-pay | Admitting: Cardiology

## 2011-10-05 NOTE — Progress Notes (Signed)
CONSULTING PHYSICIAN: Consuello Masse, MD  REASON FOR CONSULTATION: Recurrent bouts of small-bowel obstruction.  The patient referred for further evaluation question given capsule  study.  HISTORY OF PRESENT ILLNESS: [name] is a 77 year old Caucasian male who is  referred through courtesy of Dr. Consuello Masse for GI evaluation. He was  admitted to [facility] in [place] on August 30, 2011, with nausea and vomiting.  Workup revealed small-bowel obstruction. He improved with conservative  therapy. He was also seen in consultation by Dr. Ermelinda Das of  Surgical Service. Abdominopelvic CT was obtained. This study showed  moderate dilation of proximal and mid small bowel with abrupt transition  in right lower quadrant just anterior to the right common iliac artery.  There was no mass or inflammatory process identified in this area.  Incidental finding of small cyst in the spleen. The patient improved  and was discharged. He went home on September 03, 2011, and he has not  had any problems since then.  The patient had more or less similar episode in August 2009. He was  felt to have small-bowel obstruction. He had another episode in June  2010 and he was just treated in emergency room. At that time, he was  felt to have symptomatic cholelithiasis and went on to have  cholecystectomy in December 2010.  In April 2011, he was admitted with nausea and diarrhea and briefly  hospitalized and felt to have gastroenteritis.  He was admitted again in August 2011, and this time, he was diagnosed  with small-bowel obstruction and he had NG tube for decompression and he  had a full recovery. He was seen at that time, he had small-bowel  follow-through which was within normal limits.  He was readmitted on September 15, 2010, and again was felt to have a  small-bowel obstruction and treated with NG tube compression. Following  that hospitalization, he was seen by Dr. Keane Police and he felt that there  was nothing that required  him to have surgery.  Finally, he was back in the emergency room in July this year with  nausea, but no vomiting and was treated and discharged.  [name] states that between these spells, he has no symptoms. His wife  thinks that his 3 major episodes that may have been triggered by eating  at restaurant in [place]. He has good appetite. He denies weight  loss, melena or rectal bleeding. His bowels generally move once or  twice daily. His heartburn is well controlled with therapy.  CURRENT MEDICATIONS:  1. Carvedilol 12.5 mg p.o. b.i.d.  2. Diltiazem 180 mg p.o. daily.  3. Fish oil 250 mg p.o. daily.  4. Levothyroxine 50 mcg p.o. daily.  5. Magnesium gluconate 500 mg p.o. daily.  6. Nitrostat 0.4 mg sublingual p.r.n. chest pain.  7. Omeprazole 20 mg p.o. daily.  8. Simvastatin 10 mg p.o. daily.  9. Tamsulosin 0.4 mg p.o. daily.  10.Warfarin 2.5 mg 3 days each week and 3.75 mg 4 days each week.  PAST MEDICAL HISTORY: He was diagnosed with atrial fibrillation in  2007. He was initially found to have small clot in his LA, therefore  cardioversion not undertaken. He has a good LV function.  Hypothyroidism was diagnosed 3 years ago. Chronic GERD was diagnosed  about 2 years ago.  History of elevated PSA, but his last PSA according to the patient was  normal.  He had hypothyroidism. His last screening colonoscopy was in September  2008 and was unremarkable, but I do not have  the actual report.  ALLERGIES: NK.  FAMILY HISTORY: Mother was diagnosed with colon carcinoma at age 55 and  she died at age 76. His father whom he never knew died of MI at age 65.  He has 2 brothers living, 1 died of MI at age 40, another brother has  atrial fibrillation and 1 recently suffered mild CVA.  SOCIAL HISTORY: He is married. He has 1 daughter in good health. He  worked at [employer] for 34 years and for 5 years with [employer] before his retirement. He retired in 2000. He has never  smoked  cigarettes or drank alcohol.  OBJECTIVE: VITAL SIGNS: Weight 170 pounds, he is 68 inches tall.  Pulse is 68 per minute and regular, blood pressure 116/70, respiratory  rate is 14 and temp is 98.1.  EYES: Conjunctivae are pink. Sclerae are nonicteric.  MOUTH: Oropharyngeal mucosa is normal.  NECK: No neck masses or thyromegaly noted.  CARDIAC: With regular rhythm, normal S1 and S2. No murmur or gallop  noted.  LUNGS: Clear to auscultation.  ABDOMEN: Symmetrical. Bowel sounds are normal. No bruits noted. On  palpation, soft abdomen without tenderness, organomegaly or masses.  RECTAL: Deferred as he had one in October revealing guaiac negative  stool.  EXTREMITIES: No peripheral edema or clubbing noted.  Acute abdominal film is available for review and it shows dilated loops  of small bowel with multiple air-fluid levels. CT report is available  for review and as above.  ASSESSMENT: [name] is a 77 year old Caucasian male who was recently  hospitalized at [facility] in [place] for small-bowel obstruction and responded to  medical therapy. He has had at least 2 well-documented similar episodes  and possibly few more. He had another CT during his prior admission  that was unremarkable and he had a normal small bowel follow-through.  He does not have any alarm symptoms. Given his clinical course, it is  given that this will happen again. He could have subtle small bowel  stricture not picked up on imaging studies. He could also be having  intermittent ischemic injury for that matter a small tumor. In this  situation, it would be reasonable to proceed with small bowel Given  capsule study. Since there is a risk of stricture, we will need to  study with Agile capsule to make sure that it can pass through his small  bowel before he is allowed to swallow Given capsule.  RECOMMENDATIONS: We will schedule the patient for initial study with  Agile capsule and once he is documented to have passed through small    bowel, he will return for Given capsule study.  He will have plain abdominal film 30 hours after swallowing Agile  capsule followed by a Given capsule study unless there is  contraindication. I have reviewed the procedure risks with the patient  and his wife and they are agreeable.  He would like to wait until after the holidays and I do not see any  problems with it.  In the meantime, should the patient develop any symptoms, he will either  get in contact with Dr. Quintin Alto or myself.  We appreciate the opportunity to participate in the care of this  gentleman.

## 2011-10-12 NOTE — Progress Notes (Signed)
Patient ID: [name], male   DOB: 1930-11-12, 77 y.o.   MRN: [id number] LOCATION: FACILITY:  PHYSICIAN: Hildred Laser, M.D. DATE OF BIRTH: Jan 19, 1931  DATE OF CONSULTATION: 09/18/2011  DATE OF DISCHARGE:  CONSULTATION  CONSULTING PHYSICIAN: Consuello Masse, MD  REASON FOR CONSULTATION: Recurrent bouts of small-bowel obstruction.  The patient referred for further evaluation question given capsule  study.  HISTORY OF PRESENT ILLNESS: [name] is a 77 year old Caucasian male who is  referred through courtesy of Dr. Consuello Masse for GI evaluation. He was  admitted to [facility] in [place] on August 30, 2011, with nausea and vomiting.  Workup revealed small-bowel obstruction. He improved with conservative  therapy. He was also seen in consultation by Dr. Ermelinda Das of  Surgical Service. Abdominopelvic CT was obtained. This study showed  moderate dilation of proximal and mid small bowel with abrupt transition  in right lower quadrant just anterior to the right common iliac artery.  There was no mass or inflammatory process identified in this area.  Incidental finding of small cyst in the spleen. The patient improved  and was discharged. He went home on September 03, 2011, and he has not  had any problems since then.  The patient had more or less similar episode in August 2009. He was  felt to have small-bowel obstruction. He had another episode in June  2010 and he was just treated in emergency room. At that time, he was  felt to have symptomatic cholelithiasis and went on to have  cholecystectomy in December 2010.  In April 2011, he was admitted with nausea and diarrhea and briefly  hospitalized and felt to have gastroenteritis.  He was admitted again in August 2011, and this time, he was diagnosed  with small-bowel obstruction and he had NG tube for decompression and he  had a full recovery. He was seen at that time, he had small-bowel  follow-through which was within normal limits.  He was  readmitted on September 15, 2010, and again was felt to have a  small-bowel obstruction and treated with NG tube compression. Following  that hospitalization, he was seen by Dr. Keane Police and he felt that there  was nothing that required him to have surgery.  Finally, he was back in the emergency room in July this year with  nausea, but no vomiting and was treated and discharged.  Rohen states that between these spells, he has no symptoms. His wife  thinks that his 3 major episodes that may have been triggered by eating  at restaurant in [place]. He has good appetite. He denies weight  loss, melena or rectal bleeding. His bowels generally move once or  twice daily. His heartburn is well controlled with therapy.  CURRENT MEDICATIONS:  1. Carvedilol 12.5 mg p.o. b.i.d.  2. Diltiazem 180 mg p.o. daily.  3. Fish oil 250 mg p.o. daily.  4. Levothyroxine 50 mcg p.o. daily.  5. Magnesium gluconate 500 mg p.o. daily.  6. Nitrostat 0.4 mg sublingual p.r.n. chest pain.  7. Omeprazole 20 mg p.o. daily.  8. Simvastatin 10 mg p.o. daily.  9. Tamsulosin 0.4 mg p.o. daily.  10.Warfarin 2.5 mg 3 days each week and 3.75 mg 4 days each week.  PAST MEDICAL HISTORY: He was diagnosed with atrial fibrillation in  2007. He was initially found to have small clot in his LA, therefore  cardioversion not undertaken. He has a good LV function.  Hypothyroidism was diagnosed 3 years ago. Chronic GERD was diagnosed  about 2  years ago.  History of elevated PSA, but his last PSA according to the patient was  normal.  He had hypothyroidism. His last screening colonoscopy was in September  2008 and was unremarkable, but I do not have the actual report.  ALLERGIES: NK.  FAMILY HISTORY: Mother was diagnosed with colon carcinoma at age 51 and  she died at age 76. His father whom he never knew died of MI at age 66.  He has 2 brothers living, 1 died of MI at age 26, another brother has  atrial fibrillation and 1 recently  suffered mild CVA.  SOCIAL HISTORY: He is married. He has 1 daughter in good health. He  worked at [employer] for 34 years and for 5 years with [employer] before his retirement. He retired in 2000. He has never  smoked cigarettes or drank alcohol.  OBJECTIVE: VITAL SIGNS: Weight 170 pounds, he is 68 inches tall.  Pulse is 68 per minute and regular, blood pressure 116/70, respiratory  rate is 14 and temp is 98.1.  EYES: Conjunctivae are pink. Sclerae are nonicteric.  MOUTH: Oropharyngeal mucosa is normal.  NECK: No neck masses or thyromegaly noted.  CARDIAC: With regular rhythm, normal S1 and S2. No murmur or gallop  noted.  LUNGS: Clear to auscultation.  ABDOMEN: Symmetrical. Bowel sounds are normal. No bruits noted. On  palpation, soft abdomen without tenderness, organomegaly or masses.  RECTAL: Deferred as he had one in October revealing guaiac negative  stool.  EXTREMITIES: No peripheral edema or clubbing noted.  Acute abdominal film is available for review and it shows dilated loops  of small bowel with multiple air-fluid levels. CT report is available  for review and as above.  ASSESSMENT: [name] is a 77 year old Caucasian male who was recently  hospitalized at [facility] in [place] for small-bowel obstruction and responded to  medical therapy. He has had at least 2 well-documented similar episodes  and possibly few more. He had another CT during his prior admission  that was unremarkable and he had a normal small bowel follow-through.  He does not have any alarm symptoms. Given his clinical course, it is  given that this will happen again. He could have subtle small bowel  stricture not picked up on imaging studies. He could also be having  intermittent ischemic injury for that matter a small tumor. In this  situation, it would be reasonable to proceed with small bowel Given  capsule study. Since there is a risk of stricture, we will need to  study with Agile capsule to make  sure that it can pass through his small  bowel before he is allowed to swallow Given capsule.  RECOMMENDATIONS: We will schedule the patient for initial study with  Agile capsule and once he is documented to have passed through small  bowel, he will return for Given capsule study.  He will have plain abdominal film 30 hours after swallowing Agile  capsule followed by a Given capsule study unless there is  contraindication. I have reviewed the procedure risks with the patient  and his wife and they are agreeable.  He would like to wait until after the holidays and I do not see any  problems with it.  In the meantime, should the patient develop any symptoms, he will either  get in contact with Dr. Quintin Alto or myself.  We appreciate the opportunity to participate in the care of this  gentleman.

## 2011-10-18 ENCOUNTER — Ambulatory Visit (HOSPITAL_COMMUNITY)
Admission: RE | Admit: 2011-10-18 | Discharge: 2011-10-18 | Disposition: A | Discharge status: Home / Self Care | Payer: Medicare Other | Source: Ambulatory Visit | Attending: Internal Medicine | Admitting: Internal Medicine

## 2011-10-18 ENCOUNTER — Encounter (HOSPITAL_COMMUNITY)
Admission: RE | Disposition: A | Discharge status: Home / Self Care | Payer: Self-pay | Source: Ambulatory Visit | Attending: Internal Medicine

## 2011-10-18 ENCOUNTER — Encounter (HOSPITAL_COMMUNITY): Payer: Self-pay

## 2011-10-18 DIAGNOSIS — I4891 Unspecified atrial fibrillation: Secondary | ICD-10-CM | POA: Insufficient documentation

## 2011-10-18 DIAGNOSIS — Z800 Family history of malignant neoplasm of digestive organs: Secondary | ICD-10-CM | POA: Insufficient documentation

## 2011-10-18 DIAGNOSIS — K5669 Other intestinal obstruction: Secondary | ICD-10-CM | POA: Insufficient documentation

## 2011-10-18 DIAGNOSIS — Z7901 Long term (current) use of anticoagulants: Secondary | ICD-10-CM | POA: Insufficient documentation

## 2011-10-18 HISTORY — PX: AGILE CAPSULE: SHX5420

## 2011-10-18 SURGERY — AGILE CAPSULE
Anesthesia: LOCAL

## 2011-10-18 NOTE — Progress Notes (Signed)
Pt here for Agile Capsule.  Tolerated swallowing of capsule well.  Discharge instructions given to pt.  Pt to report to Radiology tomorrow at 1300 for KUB.  Pt and wife verbalize understanding.

## 2011-10-18 NOTE — H&P (Signed)
This is an update to history and physical from 09/18/2011. Patient with recurrent small bowel obstruction. He has not experienced any acute symptoms is his office visit. He is undergoing evaluation with Agile capsule before he is given actual Given capsule.

## 2011-10-19 ENCOUNTER — Ambulatory Visit (HOSPITAL_COMMUNITY)
Admission: RE | Admit: 2011-10-19 | Discharge: 2011-10-19 | Disposition: A | Discharge status: Home / Self Care | Payer: Medicare Other | Source: Ambulatory Visit | Attending: Internal Medicine | Admitting: Internal Medicine

## 2011-10-19 ENCOUNTER — Other Ambulatory Visit (INDEPENDENT_AMBULATORY_CARE_PROVIDER_SITE_OTHER): Payer: Self-pay | Admitting: Internal Medicine

## 2011-10-19 DIAGNOSIS — K56609 Unspecified intestinal obstruction, unspecified as to partial versus complete obstruction: Secondary | ICD-10-CM | POA: Insufficient documentation

## 2011-10-19 DIAGNOSIS — IMO0002 Reserved for concepts with insufficient information to code with codable children: Secondary | ICD-10-CM

## 2011-10-20 ENCOUNTER — Ambulatory Visit (INDEPENDENT_AMBULATORY_CARE_PROVIDER_SITE_OTHER): Payer: Medicare Other | Admitting: *Deleted

## 2011-10-20 DIAGNOSIS — Z7901 Long term (current) use of anticoagulants: Secondary | ICD-10-CM

## 2011-10-20 DIAGNOSIS — I4891 Unspecified atrial fibrillation: Secondary | ICD-10-CM

## 2011-10-23 ENCOUNTER — Telehealth (INDEPENDENT_AMBULATORY_CARE_PROVIDER_SITE_OTHER): Payer: Self-pay | Admitting: *Deleted

## 2011-10-23 ENCOUNTER — Other Ambulatory Visit (INDEPENDENT_AMBULATORY_CARE_PROVIDER_SITE_OTHER): Payer: Self-pay | Admitting: Internal Medicine

## 2011-10-23 DIAGNOSIS — K56609 Unspecified intestinal obstruction, unspecified as to partial versus complete obstruction: Secondary | ICD-10-CM

## 2011-10-23 DIAGNOSIS — K225 Diverticulum of esophagus, acquired: Secondary | ICD-10-CM

## 2011-10-23 NOTE — Telephone Encounter (Signed)
Needed for CT

## 2011-10-24 LAB — CREATININE, SERUM: Creat: 1.27 mg/dL (ref 0.50–1.35)

## 2011-10-25 ENCOUNTER — Encounter (HOSPITAL_COMMUNITY): Payer: Self-pay | Admitting: Internal Medicine

## 2011-10-26 ENCOUNTER — Ambulatory Visit (HOSPITAL_COMMUNITY)
Admission: RE | Admit: 2011-10-26 | Discharge: 2011-10-26 | Disposition: A | Discharge status: Home / Self Care | Payer: Medicare Other | Source: Ambulatory Visit | Attending: Internal Medicine | Admitting: Internal Medicine

## 2011-10-26 DIAGNOSIS — K56609 Unspecified intestinal obstruction, unspecified as to partial versus complete obstruction: Secondary | ICD-10-CM | POA: Insufficient documentation

## 2011-10-26 MED ORDER — IOHEXOL 300 MG/ML  SOLN
125.0000 mL | Freq: Once | INTRAMUSCULAR | Status: AC | PRN
  Administered 2011-10-26: 125 mL via INTRAVENOUS

## 2011-10-30 ENCOUNTER — Encounter (INDEPENDENT_AMBULATORY_CARE_PROVIDER_SITE_OTHER): Payer: Self-pay | Admitting: *Deleted

## 2011-10-31 ENCOUNTER — Telehealth: Payer: Self-pay | Admitting: Cardiology

## 2011-10-31 ENCOUNTER — Ambulatory Visit (INDEPENDENT_AMBULATORY_CARE_PROVIDER_SITE_OTHER): Payer: Medicare Other | Admitting: Internal Medicine

## 2011-10-31 NOTE — Telephone Encounter (Signed)
New Msg: Webb Silversmith calling from Dr. Olevia Perches office calling wanting to schedule pt for upper endoscopy 1/23 or 1/24 and wanted to know if it was alright for pt to stop taking coumadin 5 days prior to procedure. Please return call to discuss further.

## 2011-10-31 NOTE — Telephone Encounter (Signed)
Spoke with Webb Silversmith at Dr. Olevia Perches office. Request to hold Coumadin will be given to Gene (when he returns to the office tomorrow) to review in Dr. Kae Heller absence.

## 2011-10-31 NOTE — Telephone Encounter (Signed)
I talked with Webb Silversmith at Dr Olevia Perches office. She is asking if pt can hold coumadin for 5 days prior to a endoscopy.. The pt is seen in the [facility] office by Dr Ron Parker. Dr Ron Parker will be back in [facility] 11/08/11. (he is not in [facility] until 11/16/11). I will forward back to [facility] office for review and recommendations. Webb Silversmith is aware I am forwarding this to the [place] office.

## 2011-11-01 ENCOUNTER — Other Ambulatory Visit (INDEPENDENT_AMBULATORY_CARE_PROVIDER_SITE_OTHER): Payer: Self-pay | Admitting: *Deleted

## 2011-11-01 DIAGNOSIS — K56609 Unspecified intestinal obstruction, unspecified as to partial versus complete obstruction: Secondary | ICD-10-CM

## 2011-11-07 ENCOUNTER — Encounter (HOSPITAL_COMMUNITY): Payer: Self-pay | Admitting: Pharmacy Technician

## 2011-11-07 MED ORDER — SODIUM CHLORIDE 0.45 % IV SOLN
Freq: Once | INTRAVENOUS | Status: AC
  Administered 2011-11-08: 14:00:00 via INTRAVENOUS

## 2011-11-07 MED ORDER — SODIUM CHLORIDE 0.45 % IV SOLN: Freq: Once | INTRAVENOUS | Status: DC

## 2011-11-08 ENCOUNTER — Encounter (HOSPITAL_COMMUNITY)
Admission: RE | Disposition: A | Discharge status: Home / Self Care | Payer: Self-pay | Source: Ambulatory Visit | Attending: Internal Medicine

## 2011-11-08 ENCOUNTER — Other Ambulatory Visit (INDEPENDENT_AMBULATORY_CARE_PROVIDER_SITE_OTHER): Payer: Self-pay | Admitting: Internal Medicine

## 2011-11-08 ENCOUNTER — Encounter (HOSPITAL_COMMUNITY): Payer: Self-pay | Admitting: *Deleted

## 2011-11-08 ENCOUNTER — Ambulatory Visit (HOSPITAL_COMMUNITY)
Admission: RE | Admit: 2011-11-08 | Discharge: 2011-11-08 | Disposition: A | Discharge status: Home / Self Care | Payer: Medicare Other | Source: Ambulatory Visit | Attending: Internal Medicine | Admitting: Internal Medicine

## 2011-11-08 DIAGNOSIS — K209 Esophagitis, unspecified without bleeding: Secondary | ICD-10-CM | POA: Insufficient documentation

## 2011-11-08 DIAGNOSIS — Z7901 Long term (current) use of anticoagulants: Secondary | ICD-10-CM | POA: Insufficient documentation

## 2011-11-08 DIAGNOSIS — K228 Other specified diseases of esophagus: Secondary | ICD-10-CM

## 2011-11-08 DIAGNOSIS — R933 Abnormal findings on diagnostic imaging of other parts of digestive tract: Secondary | ICD-10-CM | POA: Insufficient documentation

## 2011-11-08 DIAGNOSIS — K56609 Unspecified intestinal obstruction, unspecified as to partial versus complete obstruction: Secondary | ICD-10-CM

## 2011-11-08 DIAGNOSIS — D131 Benign neoplasm of stomach: Secondary | ICD-10-CM | POA: Insufficient documentation

## 2011-11-08 DIAGNOSIS — I4891 Unspecified atrial fibrillation: Secondary | ICD-10-CM | POA: Insufficient documentation

## 2011-11-08 DIAGNOSIS — K449 Diaphragmatic hernia without obstruction or gangrene: Secondary | ICD-10-CM

## 2011-11-08 HISTORY — PX: ESOPHAGOGASTRODUODENOSCOPY: SHX5428

## 2011-11-08 SURGERY — EGD (ESOPHAGOGASTRODUODENOSCOPY)
Anesthesia: Moderate Sedation

## 2011-11-08 MED ORDER — MEPERIDINE HCL 25 MG/ML IJ SOLN
INTRAMUSCULAR | Status: DC | PRN
  Administered 2011-11-08: 25 mg via INTRAVENOUS

## 2011-11-08 MED ORDER — BUTAMBEN-TETRACAINE-BENZOCAINE 2-2-14 % EX AERO
INHALATION_SPRAY | CUTANEOUS | Status: DC | PRN
  Administered 2011-11-08: 2 via TOPICAL

## 2011-11-08 MED ORDER — MEPERIDINE HCL 50 MG/ML IJ SOLN
INTRAMUSCULAR | Status: AC
  Filled 2011-11-08: qty 1

## 2011-11-08 MED ORDER — MIDAZOLAM HCL 5 MG/5ML IJ SOLN
INTRAMUSCULAR | Status: AC
  Filled 2011-11-08: qty 10

## 2011-11-08 MED ORDER — SIMETHICONE 40 MG/0.6ML PO SUSP
ORAL | Status: DC | PRN
  Administered 2011-11-08: 15:00:00

## 2011-11-08 MED ORDER — MIDAZOLAM HCL 5 MG/5ML IJ SOLN
INTRAMUSCULAR | Status: DC | PRN
  Administered 2011-11-08: 2 mg via INTRAVENOUS
  Administered 2011-11-08: 1 mg via INTRAVENOUS
  Administered 2011-11-08: 2 mg via INTRAVENOUS

## 2011-11-08 NOTE — Op Note (Signed)
EGD PROCEDURE REPORT  PATIENT:  [name]  MR#:  [id number] Birthdate:  23-Sep-1931, 78 y.o., male Endoscopist:  Dr. Rogene Houston, MD Referred By:  Dr. Manon Hilding, M.D. Procedure Date: 11/08/2011  Procedure:   EGD with gastric polypectomy.  Indications:  Patient is a 78 year old Caucasian male was evaluated for intermittent small bowel obstruction with CT enterography as he failed test with Agile capsule. He therefore had CT enterography. While this study did not show anything significant involving a small bowel it revealed large polyp in the stomach. He is therefore here for gastric polypectomy. He has chronic GERD and is maintained on PPI.            Informed Consent:  The risks, benefits, alternatives & imponderables which include, but are not limited to, bleeding, infection, perforation, drug reaction and potential missed lesion have been reviewed.  The potential for biopsy, lesion removal, esophageal dilation, etc. have also been discussed.  Questions have been answered.  All parties agreeable.  Please see history & physical in medical record for more information.  Medications:  Demerol 25 mg IV Versed 5 mg IV Cetacaine spray topically for oropharyngeal anesthesia  Description of procedure:  The endoscope was introduced through the mouth and advanced to the second portion of the duodenum without difficulty or limitations. The mucosal surfaces were surveyed very carefully during advancement of the scope and upon withdrawal.  Findings:  Esophagus:  Mucosa of the esophagus was normal. Wavy  GE junction with 2 small tongues of salmon-colored mucosa. It was biopsied for histology. GEJ:  39 cm Hiatus:  41 cm Stomach:  Stomach was empty and distended very well with insufflation. Folds in the proximal stomach were normal. Examination of mucosa at body antrum pyloric channel as well as angular is was normal. There was large polyp with hemorrhagic surface just below cardia best seen on  retroflex view. Single Hemoclip was applied to the base of this polyp. It was then snared and the second clip was applied to polypectomy site. No bleeding was noted was polypectomy. This polyp was caught with a Jabier Mutton net and are retrieved. Duodenum:  Normal bulbar and post bulbar mucosa  Therapeutic/Diagnostic Maneuvers Performed:  See above  Complications:  None  Impression: Two small patches of salmon-colored mucosa at distal esophagus biopsy taken to rule out short segment Barrett's. Small sliding hiatal hernia. 2 cm long gastric polyp snared from cardiac. 2 hemoclips applied as noted above. Recommendations:  Patient will resume warfarin in a.m. He will get INR in 10-14 days. I would be contacting patient with results of biopsy and further recommendations  [name]  11/08/2011  3:28 PM  CC: Dr. Manon Hilding, MD, MD & Dr. Rayne Du ref. provider found

## 2011-11-08 NOTE — H&P (Signed)
[name] is an 78 y.o. male.   Chief Complaint: Patient is therefore EGD and possible gastric polypectomy. HPI: Patient is 78 years old Caucasian male who was recently evaluated for intermittent episodes of small bowel obstruction. He's undergone multiple studies but he could not on undergo given capsule study because Agilel capsule did not pass to his GI tract. He had CT enterography which reveals a gastric polyp. He is undergoing diagnostic and therapeutic EGD. He also has chronic GERD and other therapy that is upper GI tract is never been evaluated. There is no history of melena.  Past Medical History  Diagnosis Date  . Other malaise and fatigue   . CAD (coronary artery disease)     Catheterization 2004, mild/moderate nonobstructive disease  /   nuclear, 2007, small inferior scar // no ischemia  . Atrial fibrillation     Permanent  . GERD (gastroesophageal reflux disease)   . Clot     LA, small, in past  . Ejection fraction 02/2009    EF 55-60%,Echo, May, 2010  . LVH (left ventricular hypertrophy) 02/2009     Mild, echo, 2010  . Mitral regurgitation     Mild, echo, flat closure  . Elevated PSA   . Nausea vomiting and diarrhea 01/2010    Hospitalization, probably viral (or partial small bowel obstruction)/ Hospital 09/2010, recurrent possible partial small bowel obstruction, medical therapy  . Dizziness 03/2010    June, 2011   Mild, stop digoxin, felt better  . Carotid bruit     Doppler, December 23, 2010, no significant carotid disease  . Warfarin anticoagulation     Atrial fibrillation    Past Surgical History  Procedure Date  . Cardiac catheterization 2004  . Colonoscopy 2008    [facility]  . Cholecystectomy 2010    Dr. Anthony Sar  . Agile capsule 10/18/2011    Procedure: AGILE CAPSULE;  Surgeon: Rogene Houston, MD;  Location: [facility];  Service: Endoscopy;  Laterality: N/A;  730    Family History  Problem Relation Age of Onset  . Colon cancer Mother   . Heart disease  Father   . Stroke Brother   . Healthy Daughter    Social History:  reports that he has never smoked. He has never used smokeless tobacco. He reports that he does not drink alcohol or use illicit drugs.  Allergies: No Active Allergies  Medications Prior to Admission  Medication Dose Route Frequency Provider Last Rate Last Dose  . 0.45 % sodium chloride infusion   Intravenous Once Rogene Houston, MD 20 mL/hr at 11/08/11 1413    . meperidine (DEMEROL) 50 MG/ML injection           . midazolam (VERSED) 5 MG/5ML injection           . DISCONTD: 0.45 % sodium chloride infusion   Intravenous Once Rogene Houston, MD       Medications Prior to Admission  Medication Sig Dispense Refill  . carvedilol (COREG) 12.5 MG tablet Take 12.5 mg by mouth 2 (two) times daily.        Marland Kitchen diltiazem (CARDIZEM CD) 180 MG 24 hr capsule Take 180 mg by mouth daily.        Marland Kitchen levothyroxine (SYNTHROID, LEVOTHROID) 50 MCG tablet Take 50 mcg by mouth daily.       . magnesium gluconate (MAGONATE) 500 MG tablet Take 250 mg by mouth daily.       Marland Kitchen omeprazole (PRILOSEC) 20 MG capsule  Take 20 mg by mouth daily.       . simvastatin (ZOCOR) 10 MG tablet Take 10 mg by mouth at bedtime.        . Tamsulosin HCl (FLOMAX) 0.4 MG CAPS Take 0.4 mg by mouth every evening.       . fish oil-omega-3 fatty acids 1000 MG capsule Take 250 mg by mouth daily.        . nitroGLYCERIN (NITROSTAT) 0.4 MG SL tablet Place 0.4 mg under the tongue every 5 (five) minutes as needed. For chest pains. May repeat for up to 3 doses.      Marland Kitchen warfarin (COUMADIN) 2.5 MG tablet Take 2.5 mg by mouth daily. 2.5 mg every day except Sunday, Tues, Thurs, Sat 3.75 mg        No results found for this or any previous visit (from the past 48 hour(s)). No results found.  Review of Systems  Constitutional: Negative for weight loss.    Blood pressure 148/80, pulse 83, temperature 97.7 F (36.5 C), temperature source Oral, resp. rate 23, SpO2 100.00%. Physical Exam    Constitutional: He appears well-developed and well-nourished.  HENT:  Mouth/Throat: Oropharynx is clear and moist.  Eyes: Conjunctivae are normal. No scleral icterus.  Neck: No thyromegaly present.  Cardiovascular:       Irregular rhythm normal S1 and S2, no murmur or gallop noted  Respiratory: Effort normal and breath sounds normal.  GI: Soft. He exhibits no distension and no mass. There is no tenderness.  Musculoskeletal: He exhibits no edema.  Lymphadenopathy:    He has no cervical adenopathy.  Neurological: He is alert.  Skin: Skin is warm and dry.     Assessment/Plan Abnormal CT enterography suggesting gastric polyp. EGD and gastric polypectomy  REHMAN,NAJEEB U 11/08/2011, 2:56 PM

## 2011-11-13 ENCOUNTER — Encounter (INDEPENDENT_AMBULATORY_CARE_PROVIDER_SITE_OTHER): Payer: Self-pay | Admitting: Internal Medicine

## 2011-11-15 ENCOUNTER — Encounter (HOSPITAL_COMMUNITY): Payer: Self-pay | Admitting: Internal Medicine

## 2011-11-17 ENCOUNTER — Ambulatory Visit (INDEPENDENT_AMBULATORY_CARE_PROVIDER_SITE_OTHER): Payer: Medicare Other | Admitting: *Deleted

## 2011-11-17 DIAGNOSIS — I4891 Unspecified atrial fibrillation: Secondary | ICD-10-CM

## 2011-11-17 DIAGNOSIS — Z7901 Long term (current) use of anticoagulants: Secondary | ICD-10-CM

## 2011-12-15 ENCOUNTER — Ambulatory Visit (INDEPENDENT_AMBULATORY_CARE_PROVIDER_SITE_OTHER): Payer: Medicare Other | Admitting: *Deleted

## 2011-12-15 DIAGNOSIS — Z7901 Long term (current) use of anticoagulants: Secondary | ICD-10-CM

## 2011-12-15 DIAGNOSIS — I4891 Unspecified atrial fibrillation: Secondary | ICD-10-CM

## 2012-01-09 ENCOUNTER — Ambulatory Visit (INDEPENDENT_AMBULATORY_CARE_PROVIDER_SITE_OTHER): Payer: Medicare Other | Admitting: *Deleted

## 2012-01-09 DIAGNOSIS — I4891 Unspecified atrial fibrillation: Secondary | ICD-10-CM

## 2012-01-09 DIAGNOSIS — Z7901 Long term (current) use of anticoagulants: Secondary | ICD-10-CM

## 2012-01-09 LAB — POCT INR: INR: 2.7

## 2012-01-10 ENCOUNTER — Encounter (INDEPENDENT_AMBULATORY_CARE_PROVIDER_SITE_OTHER): Payer: Self-pay

## 2012-02-02 ENCOUNTER — Ambulatory Visit (INDEPENDENT_AMBULATORY_CARE_PROVIDER_SITE_OTHER): Payer: Medicare Other | Admitting: *Deleted

## 2012-02-02 ENCOUNTER — Ambulatory Visit (INDEPENDENT_AMBULATORY_CARE_PROVIDER_SITE_OTHER): Payer: Medicare Other | Admitting: Cardiology

## 2012-02-02 ENCOUNTER — Encounter: Payer: Self-pay | Admitting: Cardiology

## 2012-02-02 VITALS — BP 133/78 | HR 64 | Ht 68.0 in | Wt 168.4 lb

## 2012-02-02 DIAGNOSIS — R42 Dizziness and giddiness: Secondary | ICD-10-CM

## 2012-02-02 DIAGNOSIS — I4891 Unspecified atrial fibrillation: Secondary | ICD-10-CM

## 2012-02-02 DIAGNOSIS — Z7901 Long term (current) use of anticoagulants: Secondary | ICD-10-CM

## 2012-02-02 DIAGNOSIS — I2510 Atherosclerotic heart disease of native coronary artery without angina pectoris: Secondary | ICD-10-CM

## 2012-02-02 LAB — POCT INR: INR: 2.9

## 2012-02-02 NOTE — Assessment & Plan Note (Signed)
Atrial fib rate is controlled. No change in therapy. He continues on Coumadin.

## 2012-02-02 NOTE — Patient Instructions (Signed)
Continue all current medications. Your physician wants you to follow up in: 6 months.  You will receive a reminder letter in the mail one-two months in advance.  If you don't receive a letter, please call our office to schedule the follow up appointment   

## 2012-02-02 NOTE — Progress Notes (Signed)
HPI  Patient is seen today to followup atrial fibrillation and coronary artery disease. He is stable. I saw him last October, 2012. Since that time he said further GI evaluation. A polyp was removed. It was benign. He's doing well. He's not having chest pain or shortness of breath.  After I saw him in last October we proceeded with a nuclear study as he had not had any evaluation for greater than 5 years. There was mild attenuation defect. There was no definite ischemia. Ejection fraction was 54%.  No Known Allergies  Current Outpatient Prescriptions  Medication Sig Dispense Refill  . carvedilol (COREG) 12.5 MG tablet Take 12.5 mg by mouth 2 (two) times daily.        Marland Kitchen diltiazem (CARDIZEM CD) 180 MG 24 hr capsule Take 180 mg by mouth daily.        . fish oil-omega-3 fatty acids 1000 MG capsule Take 250 mg by mouth daily.        Marland Kitchen levothyroxine (SYNTHROID, LEVOTHROID) 50 MCG tablet Take 50 mcg by mouth daily.       . magnesium gluconate (MAGONATE) 500 MG tablet Take 1,000 mg by mouth daily.      . nitroGLYCERIN (NITROSTAT) 0.4 MG SL tablet Place 0.4 mg under the tongue every 5 (five) minutes as needed. For chest pains. May repeat for up to 3 doses.      Marland Kitchen omeprazole (PRILOSEC) 20 MG capsule Take 20 mg by mouth daily.       . simvastatin (ZOCOR) 10 MG tablet Take 5 mg by mouth at bedtime.       . Tamsulosin HCl (FLOMAX) 0.4 MG CAPS Take 0.4 mg by mouth every evening.       . warfarin (COUMADIN) 2.5 MG tablet Take 2.5 mg by mouth daily. 2.5 mg every day except Sunday, Tues, Thurs, Sat 3.75 mg        History   Social History  . Marital Status: Married    Spouse Name: N/A    Number of Children: N/A  . Years of Education: N/A   Occupational History  . Retired    Social History Main Topics  . Smoking status: Never Smoker   . Smokeless tobacco: Never Used  . Alcohol Use: No  . Drug Use: No  . Sexually Active: Not on file   Other Topics Concern  . Not on file   Social History  Narrative   Married    Family History  Problem Relation Age of Onset  . Colon cancer Mother   . Heart disease Father   . Stroke Brother   . Healthy Daughter     Past Medical History  Diagnosis Date  . Other malaise and fatigue   . CAD (coronary artery disease)     Catheterization 2004, mild/moderate nonobstructive disease  /   nuclear, 2007, small inferior scar // no ischemia  . Atrial fibrillation     Permanent  . GERD (gastroesophageal reflux disease)   . Clot     LA, small, in past  . Ejection fraction 02/2009    EF 55-60%,Echo, May, 2010  . LVH (left ventricular hypertrophy) 02/2009     Mild, echo, 2010  . Mitral regurgitation     Mild, echo, flat closure  . Elevated PSA   . Nausea vomiting and diarrhea 01/2010    Hospitalization, probably viral (or partial small bowel obstruction)/ Hospital 09/2010, recurrent possible partial small bowel obstruction, medical therapy  . Dizziness 03/2010  June, 2011   Mild, stop digoxin, felt better  . Carotid bruit     Doppler, December 23, 2010, no significant carotid disease  . Warfarin anticoagulation     Atrial fibrillation    Past Surgical History  Procedure Date  . Cardiac catheterization 2004  . Colonoscopy 2008    [facility]  . Cholecystectomy 2010    Dr. Anthony Sar  . Agile capsule 10/18/2011    Procedure: AGILE CAPSULE;  Surgeon: Rogene Houston, MD;  Location: [facility];  Service: Endoscopy;  Laterality: N/A;  730  . Esophagogastroduodenoscopy 11/08/2011    Procedure: ESOPHAGOGASTRODUODENOSCOPY (EGD);  Surgeon: Rogene Houston, MD;  Location: [facility];  Service: Endoscopy;  Laterality: N/A;  300    ROS    The patient denies fever, chills, headache, sweats, rash, change in vision, change in hearing, chest pain, cough, nausea vomiting, urinary symptoms. All other systems are reviewed and are negative.   PHYSICAL EXAM  Patient is oriented to person time and place. Affect is normal. Patient is here with his wife.  There is no jugular venous distention. Lungs are clear. Respiratory effort is nonlabored. Cardiac exam reveals S1 and S2. There are no clicks or significant murmurs. The abdomen is soft. Is no peripheral edema.  Filed Vitals:   02/02/12 1301  BP: 133/78  Pulse: 64  Height: 5' 8"  (1.727 m)  Weight: 168 lb 6.4 oz (76.386 kg)    EKG   EKG is done today and reviewed by me. There is no significant change.  ASSESSMENT & PLAN

## 2012-02-02 NOTE — Assessment & Plan Note (Signed)
He has slight dizziness if he stands too rapidly. Otherwise he is quite stable and no further workup.

## 2012-02-02 NOTE — Assessment & Plan Note (Signed)
Coronary disease is stable. Nuclear study in October, 2012, revealed no significant ischemia. Ejection fraction is normal. He's not having any symptoms. No change in therapy.

## 2012-03-01 ENCOUNTER — Ambulatory Visit (INDEPENDENT_AMBULATORY_CARE_PROVIDER_SITE_OTHER): Payer: Medicare Other | Admitting: *Deleted

## 2012-03-01 DIAGNOSIS — I4891 Unspecified atrial fibrillation: Secondary | ICD-10-CM

## 2012-03-01 DIAGNOSIS — Z7901 Long term (current) use of anticoagulants: Secondary | ICD-10-CM

## 2012-03-01 LAB — POCT INR: INR: 2.1

## 2012-03-29 ENCOUNTER — Ambulatory Visit (INDEPENDENT_AMBULATORY_CARE_PROVIDER_SITE_OTHER): Payer: Medicare Other | Admitting: *Deleted

## 2012-03-29 DIAGNOSIS — I4891 Unspecified atrial fibrillation: Secondary | ICD-10-CM

## 2012-03-29 DIAGNOSIS — Z7901 Long term (current) use of anticoagulants: Secondary | ICD-10-CM

## 2012-03-29 LAB — POCT INR: INR: 2.2

## 2012-04-26 ENCOUNTER — Ambulatory Visit (INDEPENDENT_AMBULATORY_CARE_PROVIDER_SITE_OTHER): Payer: Medicare Other | Admitting: *Deleted

## 2012-04-26 DIAGNOSIS — Z7901 Long term (current) use of anticoagulants: Secondary | ICD-10-CM

## 2012-04-26 DIAGNOSIS — I4891 Unspecified atrial fibrillation: Secondary | ICD-10-CM

## 2012-04-26 LAB — POCT INR: INR: 2.1

## 2012-06-07 ENCOUNTER — Ambulatory Visit (INDEPENDENT_AMBULATORY_CARE_PROVIDER_SITE_OTHER): Payer: Medicare Other | Admitting: *Deleted

## 2012-06-07 DIAGNOSIS — Z7901 Long term (current) use of anticoagulants: Secondary | ICD-10-CM

## 2012-06-07 DIAGNOSIS — I4891 Unspecified atrial fibrillation: Secondary | ICD-10-CM

## 2012-06-07 LAB — POCT INR: INR: 3.3

## 2012-07-05 ENCOUNTER — Ambulatory Visit (INDEPENDENT_AMBULATORY_CARE_PROVIDER_SITE_OTHER): Payer: Medicare Other | Admitting: *Deleted

## 2012-07-05 DIAGNOSIS — I4891 Unspecified atrial fibrillation: Secondary | ICD-10-CM

## 2012-07-05 DIAGNOSIS — Z7901 Long term (current) use of anticoagulants: Secondary | ICD-10-CM

## 2012-07-05 LAB — POCT INR: INR: 3

## 2012-08-02 ENCOUNTER — Ambulatory Visit (INDEPENDENT_AMBULATORY_CARE_PROVIDER_SITE_OTHER): Payer: Medicare Other | Admitting: *Deleted

## 2012-08-02 DIAGNOSIS — I4891 Unspecified atrial fibrillation: Secondary | ICD-10-CM

## 2012-08-02 DIAGNOSIS — Z7901 Long term (current) use of anticoagulants: Secondary | ICD-10-CM

## 2012-08-02 LAB — POCT INR: INR: 2.4

## 2012-08-30 ENCOUNTER — Ambulatory Visit (INDEPENDENT_AMBULATORY_CARE_PROVIDER_SITE_OTHER): Payer: Medicare Other | Admitting: *Deleted

## 2012-08-30 DIAGNOSIS — Z7901 Long term (current) use of anticoagulants: Secondary | ICD-10-CM

## 2012-08-30 DIAGNOSIS — I4891 Unspecified atrial fibrillation: Secondary | ICD-10-CM

## 2012-08-30 LAB — POCT INR: INR: 2.7

## 2012-10-11 ENCOUNTER — Ambulatory Visit (INDEPENDENT_AMBULATORY_CARE_PROVIDER_SITE_OTHER): Payer: Medicare Other | Admitting: *Deleted

## 2012-10-11 DIAGNOSIS — I4891 Unspecified atrial fibrillation: Secondary | ICD-10-CM

## 2012-10-11 DIAGNOSIS — Z7901 Long term (current) use of anticoagulants: Secondary | ICD-10-CM

## 2012-11-01 ENCOUNTER — Ambulatory Visit (INDEPENDENT_AMBULATORY_CARE_PROVIDER_SITE_OTHER): Payer: Medicare Other | Admitting: *Deleted

## 2012-11-01 DIAGNOSIS — I4891 Unspecified atrial fibrillation: Secondary | ICD-10-CM

## 2012-11-01 DIAGNOSIS — Z7901 Long term (current) use of anticoagulants: Secondary | ICD-10-CM

## 2012-11-01 LAB — POCT INR: INR: 3.6

## 2012-11-22 ENCOUNTER — Ambulatory Visit (INDEPENDENT_AMBULATORY_CARE_PROVIDER_SITE_OTHER): Payer: Medicare Other | Admitting: *Deleted

## 2012-11-22 DIAGNOSIS — Z7901 Long term (current) use of anticoagulants: Secondary | ICD-10-CM

## 2012-11-22 DIAGNOSIS — I4891 Unspecified atrial fibrillation: Secondary | ICD-10-CM

## 2012-12-24 ENCOUNTER — Ambulatory Visit (INDEPENDENT_AMBULATORY_CARE_PROVIDER_SITE_OTHER): Payer: Medicare Other | Admitting: *Deleted

## 2012-12-24 DIAGNOSIS — I4891 Unspecified atrial fibrillation: Secondary | ICD-10-CM

## 2012-12-24 DIAGNOSIS — Z7901 Long term (current) use of anticoagulants: Secondary | ICD-10-CM

## 2013-01-29 ENCOUNTER — Ambulatory Visit: Payer: Medicare Other | Admitting: Cardiology

## 2013-01-31 ENCOUNTER — Ambulatory Visit: Payer: Medicare Other | Admitting: Cardiology

## 2013-02-04 ENCOUNTER — Ambulatory Visit (INDEPENDENT_AMBULATORY_CARE_PROVIDER_SITE_OTHER): Payer: Medicare Other | Admitting: *Deleted

## 2013-02-04 DIAGNOSIS — I4891 Unspecified atrial fibrillation: Secondary | ICD-10-CM

## 2013-02-04 DIAGNOSIS — Z7901 Long term (current) use of anticoagulants: Secondary | ICD-10-CM

## 2013-02-04 LAB — POCT INR: INR: 2.4

## 2013-03-18 ENCOUNTER — Ambulatory Visit (INDEPENDENT_AMBULATORY_CARE_PROVIDER_SITE_OTHER): Payer: Medicare Other | Admitting: *Deleted

## 2013-03-18 DIAGNOSIS — I4891 Unspecified atrial fibrillation: Secondary | ICD-10-CM

## 2013-03-18 DIAGNOSIS — Z7901 Long term (current) use of anticoagulants: Secondary | ICD-10-CM

## 2013-03-18 LAB — POCT INR: INR: 2.3

## 2013-04-02 ENCOUNTER — Ambulatory Visit (INDEPENDENT_AMBULATORY_CARE_PROVIDER_SITE_OTHER): Payer: Medicare Other | Admitting: Cardiology

## 2013-04-02 ENCOUNTER — Encounter: Payer: Self-pay | Admitting: Cardiology

## 2013-04-02 VITALS — BP 114/69 | HR 88 | Ht 68.0 in | Wt 170.0 lb

## 2013-04-02 DIAGNOSIS — I2510 Atherosclerotic heart disease of native coronary artery without angina pectoris: Secondary | ICD-10-CM

## 2013-04-02 DIAGNOSIS — R9430 Abnormal result of cardiovascular function study, unspecified: Secondary | ICD-10-CM

## 2013-04-02 DIAGNOSIS — I4891 Unspecified atrial fibrillation: Secondary | ICD-10-CM

## 2013-04-02 DIAGNOSIS — I059 Rheumatic mitral valve disease, unspecified: Secondary | ICD-10-CM

## 2013-04-02 DIAGNOSIS — I340 Nonrheumatic mitral (valve) insufficiency: Secondary | ICD-10-CM

## 2013-04-02 DIAGNOSIS — Z7901 Long term (current) use of anticoagulants: Secondary | ICD-10-CM

## 2013-04-02 DIAGNOSIS — R0989 Other specified symptoms and signs involving the circulatory and respiratory systems: Secondary | ICD-10-CM

## 2013-04-02 DIAGNOSIS — R42 Dizziness and giddiness: Secondary | ICD-10-CM

## 2013-04-02 NOTE — Progress Notes (Signed)
HPI  Patient is seen back today to followup atrial fibrillation and coronary disease. [name] really is doing well. I saw him last in April, 2013. [name]'s had some mild recurrent GI symptoms. [name] has not had any cardiac symptoms. [name] continues on Coumadin. A nuclear scan in October, 2012 showed no ischemia. His last two-dimensional echo was 2011. [name] did have mild to moderate MR at that time.  [name] has not golfing because of back pain. [name] is active otherwise.  No Known Allergies  Current Outpatient Prescriptions  Medication Sig Dispense Refill  . carvedilol (COREG) 12.5 MG tablet Take 12.5 mg by mouth 2 (two) times daily.        Marland Kitchen diltiazem (CARDIZEM CD) 180 MG 24 hr capsule Take 180 mg by mouth daily.        . fish oil-omega-3 fatty acids 1000 MG capsule Take 250 mg by mouth daily.        Marland Kitchen levothyroxine (SYNTHROID, LEVOTHROID) 50 MCG tablet Take 50 mcg by mouth daily.       . magnesium gluconate (MAGONATE) 500 MG tablet Take 500 mg by mouth daily. Take 1/2 tablet daily       . nitroGLYCERIN (NITROSTAT) 0.4 MG SL tablet Place 0.4 mg under the tongue every 5 (five) minutes as needed. For chest pains. May repeat for up to 3 doses.      Marland Kitchen omeprazole (PRILOSEC) 20 MG capsule Take 20 mg by mouth daily.       . simvastatin (ZOCOR) 10 MG tablet Take 5 mg by mouth at bedtime.       . Tamsulosin HCl (FLOMAX) 0.4 MG CAPS Take 0.4 mg by mouth every evening.       . warfarin (COUMADIN) 2.5 MG tablet Take 2.5 mg by mouth daily. 2.5 mg every day except Sunday, Tues, Thurs, Sat 3.75 mg       No current facility-administered medications for this visit.    History   Social History  . Marital Status: Married    Spouse Name: N/A    Number of Children: N/A  . Years of Education: N/A   Occupational History  . Retired    Social History Main Topics  . Smoking status: Never Smoker   . Smokeless tobacco: Never Used  . Alcohol Use: No  . Drug Use: No  . Sexually Active: Not on file   Other Topics Concern  .  Not on file   Social History Narrative   Married    Family History  Problem Relation Age of Onset  . Colon cancer Mother   . Heart disease Father   . Stroke Brother   . Healthy Daughter     Past Medical History  Diagnosis Date  . Other malaise and fatigue   . CAD (coronary artery disease)     Catheterization 2004, mild/moderate nonobstructive disease  /   nuclear, 2007, small inferior scar // no ischemia  . Atrial fibrillation     Permanent  . GERD (gastroesophageal reflux disease)   . Clot     LA, small, in past  . Ejection fraction 02/2009    EF 55-60%,Echo, May, 2010  . LVH (left ventricular hypertrophy) 02/2009     Mild, echo, 2010  . Mitral regurgitation     Mild, echo, flat closure  . Elevated PSA   . Nausea vomiting and diarrhea 01/2010    Hospitalization, probably viral (or partial small bowel obstruction)/ Hospital 09/2010, recurrent possible partial small bowel obstruction, medical therapy  .  Dizziness 03/2010    June, 2011   Mild, stop digoxin, felt better  . Carotid bruit     Doppler, December 23, 2010, no significant carotid disease  . Warfarin anticoagulation     Atrial fibrillation    Past Surgical History  Procedure Laterality Date  . Cardiac catheterization  2004  . Colonoscopy  2008    [facility]  . Cholecystectomy  2010    Dr. Anthony Sar  . Agile capsule  10/18/2011    Procedure: AGILE CAPSULE;  Surgeon: Rogene Houston, MD;  Location: [facility];  Service: Endoscopy;  Laterality: N/A;  730  . Esophagogastroduodenoscopy  11/08/2011    Procedure: ESOPHAGOGASTRODUODENOSCOPY (EGD);  Surgeon: Rogene Houston, MD;  Location: [facility];  Service: Endoscopy;  Laterality: N/A;  300    Patient Active Problem List   Diagnosis Date Noted  . Warfarin anticoagulation     Priority: High  . Ejection fraction 02/13/2009    Priority: High  . Hx SBO 09/18/2011  . Carotid bruit   . Other malaise and fatigue   . CAD (coronary artery disease)   . Atrial  fibrillation   . GERD (gastroesophageal reflux disease)   . Clot   . Mitral regurgitation   . Dizziness 03/16/2010  . Nausea vomiting and diarrhea 01/14/2010    ROS   Patient denies fever, chills, headache, sweats, rash, change in vision, change in hearing, chest pain, cough, nausea vomiting, urinary symptoms. All other systems are reviewed and are negative.  PHYSICAL EXAM  Patient is here with his wife today as always. [name] is oriented to person time and place. Affect is normal. There is no jugulovenous distention. Lungs are clear. Respiratory effort is nonlabored. Cardiac exam reveals S1 and S2. The rhythm is irregularly irregular. The abdomen is soft. There is no peripheral edema. There are no musculoskeletal deformities. There are no skin rashes.  Filed Vitals:   04/02/13 1322  BP: 114/69  Pulse: 88  Height: 5' 8"  (1.727 m)  Weight: 170 lb (77.111 kg)   I reviewed an EKG in the computer from December, 2013. This showed no change.  ASSESSMENT & PLAN

## 2013-04-02 NOTE — Assessment & Plan Note (Signed)
He has mild carotid disease. We will plan to get a followup Doppler 1 I see him next year.

## 2013-04-02 NOTE — Assessment & Plan Note (Signed)
We know from October, 2012 that he had no ischemia at that time. He had mild-to-moderate obstructive disease in the past. He has never had an intervention.

## 2013-04-02 NOTE — Patient Instructions (Addendum)
Your physician recommends that you schedule a follow-up appointment in: 1 year. You will receive a reminder letter in the mail in about 10 months reminding you to call and schedule your appointment. If you don't receive this letter, please contact our office. Your physician recommends that you continue on your current medications as directed. Please refer to the Current Medication list given to you today. Your physician has requested that you have an echocardiogram. Echocardiography is a painless test that uses sound waves to create images of your heart. It provides your doctor with information about the size and shape of your heart and how well your heart's chambers and valves are working. This procedure takes approximately one hour. There are no restrictions for this procedure.

## 2013-04-02 NOTE — Assessment & Plan Note (Signed)
He is not have any significant dizziness. No further workup.

## 2013-04-02 NOTE — Assessment & Plan Note (Signed)
The patient continues on Coumadin. He tolerates it very well. He and I and his wife had a long discussion about the newer anticoagulants. We all agreed that in his case it seems most prudent to continue the medication that he has been so stable with. It is of interest that his brother-in-law has had significant problems with any of the new agents and remains on Coumadin.

## 2013-04-02 NOTE — Assessment & Plan Note (Signed)
He has permanent atrial fibrillation and the rate is controlled. He is on Coumadin. No further workup.

## 2013-04-02 NOTE — Assessment & Plan Note (Signed)
Historically his ejection fraction is 55-60%, last echo April, 2011. It is time to reassess LV function and the severity of his mitral regurgitation. 2-D echo will be scheduled. I'll be in touch with him with the results.

## 2013-04-02 NOTE — Assessment & Plan Note (Signed)
He's had milder mild to moderate right regurgitation in the past. It is now time for followup 2-D echo to reassess.  As part of today's evaluation I spent greater than 25 minutes with is total care. More than half of this time was spent with direct contact with the patient and his wife.

## 2013-04-10 ENCOUNTER — Other Ambulatory Visit (INDEPENDENT_AMBULATORY_CARE_PROVIDER_SITE_OTHER): Payer: Medicare Other

## 2013-04-10 ENCOUNTER — Other Ambulatory Visit: Payer: Self-pay

## 2013-04-10 DIAGNOSIS — I059 Rheumatic mitral valve disease, unspecified: Secondary | ICD-10-CM

## 2013-04-10 DIAGNOSIS — I340 Nonrheumatic mitral (valve) insufficiency: Secondary | ICD-10-CM

## 2013-04-10 DIAGNOSIS — I4891 Unspecified atrial fibrillation: Secondary | ICD-10-CM

## 2013-04-10 DIAGNOSIS — I2510 Atherosclerotic heart disease of native coronary artery without angina pectoris: Secondary | ICD-10-CM

## 2013-04-11 ENCOUNTER — Telehealth: Payer: Self-pay | Admitting: *Deleted

## 2013-04-11 NOTE — Telephone Encounter (Signed)
Message copied by Merlene Laughter on Fri Apr 11, 2013 10:33 AM ------      Message from: Ron Parker, [place] D      Created: Fri Apr 11, 2013 10:09 AM       Let him know that echo looks good ------

## 2013-04-11 NOTE — Telephone Encounter (Signed)
Patient informed. 

## 2013-05-02 ENCOUNTER — Ambulatory Visit (INDEPENDENT_AMBULATORY_CARE_PROVIDER_SITE_OTHER): Payer: Medicare Other | Admitting: *Deleted

## 2013-05-02 DIAGNOSIS — I4891 Unspecified atrial fibrillation: Secondary | ICD-10-CM

## 2013-05-02 DIAGNOSIS — Z7901 Long term (current) use of anticoagulants: Secondary | ICD-10-CM

## 2013-05-02 LAB — POCT INR: INR: 2.5

## 2013-05-29 DIAGNOSIS — M79609 Pain in unspecified limb: Secondary | ICD-10-CM

## 2013-06-13 ENCOUNTER — Ambulatory Visit (INDEPENDENT_AMBULATORY_CARE_PROVIDER_SITE_OTHER): Payer: Medicare Other | Admitting: *Deleted

## 2013-06-13 DIAGNOSIS — I4891 Unspecified atrial fibrillation: Secondary | ICD-10-CM

## 2013-06-13 DIAGNOSIS — Z7901 Long term (current) use of anticoagulants: Secondary | ICD-10-CM

## 2013-07-25 ENCOUNTER — Ambulatory Visit (INDEPENDENT_AMBULATORY_CARE_PROVIDER_SITE_OTHER): Payer: Medicare Other | Admitting: *Deleted

## 2013-07-25 DIAGNOSIS — Z7901 Long term (current) use of anticoagulants: Secondary | ICD-10-CM

## 2013-07-25 DIAGNOSIS — I4891 Unspecified atrial fibrillation: Secondary | ICD-10-CM

## 2013-08-15 ENCOUNTER — Ambulatory Visit (INDEPENDENT_AMBULATORY_CARE_PROVIDER_SITE_OTHER): Payer: Medicare Other | Admitting: *Deleted

## 2013-08-15 DIAGNOSIS — I4891 Unspecified atrial fibrillation: Secondary | ICD-10-CM

## 2013-08-15 DIAGNOSIS — Z7901 Long term (current) use of anticoagulants: Secondary | ICD-10-CM

## 2013-08-15 LAB — POCT INR: INR: 2.5

## 2013-09-16 ENCOUNTER — Ambulatory Visit (INDEPENDENT_AMBULATORY_CARE_PROVIDER_SITE_OTHER): Payer: Medicare Other | Admitting: *Deleted

## 2013-09-16 DIAGNOSIS — Z7901 Long term (current) use of anticoagulants: Secondary | ICD-10-CM

## 2013-09-16 DIAGNOSIS — I4891 Unspecified atrial fibrillation: Secondary | ICD-10-CM

## 2013-09-16 LAB — POCT INR: INR: 4.7

## 2013-10-03 ENCOUNTER — Ambulatory Visit (INDEPENDENT_AMBULATORY_CARE_PROVIDER_SITE_OTHER): Payer: Medicare Other | Admitting: Pharmacist

## 2013-10-03 DIAGNOSIS — I4891 Unspecified atrial fibrillation: Secondary | ICD-10-CM

## 2013-10-03 DIAGNOSIS — Z7901 Long term (current) use of anticoagulants: Secondary | ICD-10-CM

## 2013-10-03 LAB — POCT INR: INR: 2.2

## 2013-10-31 ENCOUNTER — Ambulatory Visit (INDEPENDENT_AMBULATORY_CARE_PROVIDER_SITE_OTHER): Payer: Commercial Managed Care - HMO | Admitting: *Deleted

## 2013-10-31 DIAGNOSIS — Z7901 Long term (current) use of anticoagulants: Secondary | ICD-10-CM

## 2013-10-31 DIAGNOSIS — I4891 Unspecified atrial fibrillation: Secondary | ICD-10-CM

## 2013-10-31 LAB — POCT INR: INR: 2.9

## 2013-11-27 ENCOUNTER — Encounter: Payer: Self-pay | Admitting: Cardiology

## 2013-12-01 ENCOUNTER — Encounter: Payer: Self-pay | Admitting: Cardiology

## 2013-12-01 ENCOUNTER — Encounter: Payer: Self-pay | Admitting: *Deleted

## 2013-12-01 ENCOUNTER — Ambulatory Visit (INDEPENDENT_AMBULATORY_CARE_PROVIDER_SITE_OTHER): Payer: Commercial Managed Care - HMO | Admitting: Cardiology

## 2013-12-01 VITALS — BP 103/62 | HR 93 | Ht 68.0 in | Wt 167.0 lb

## 2013-12-01 DIAGNOSIS — Z7901 Long term (current) use of anticoagulants: Secondary | ICD-10-CM

## 2013-12-01 DIAGNOSIS — R0989 Other specified symptoms and signs involving the circulatory and respiratory systems: Secondary | ICD-10-CM

## 2013-12-01 DIAGNOSIS — I059 Rheumatic mitral valve disease, unspecified: Secondary | ICD-10-CM

## 2013-12-01 DIAGNOSIS — I2510 Atherosclerotic heart disease of native coronary artery without angina pectoris: Secondary | ICD-10-CM

## 2013-12-01 DIAGNOSIS — I4891 Unspecified atrial fibrillation: Secondary | ICD-10-CM

## 2013-12-01 DIAGNOSIS — I340 Nonrheumatic mitral (valve) insufficiency: Secondary | ICD-10-CM

## 2013-12-01 DIAGNOSIS — R9430 Abnormal result of cardiovascular function study, unspecified: Secondary | ICD-10-CM

## 2013-12-01 NOTE — Progress Notes (Signed)
HPI  Patient is seen today for followup atrial fibrillation and mild coronary disease. He's also seen for followup her recent hospitalization at [facility]. The patient had some vague chest discomfort and was hospitalized very recently. His enzymes were negative. There was no new diagnostic EKG change. I spoke with Dr. Rowe Pavy who actually took care of the patient in the hospital. He let me know that he thought the patient was stable. He did put the patient on some Imdur and arrange for early followup today in the office.  The patient had a headache from his Imdur. He did not take it for 2 nights. He has not had any return of chest discomfort. It is of note that as he was leaving the hospital last Friday he began to have a cough. It appears that he does have a mild viral illness at this time with a mild persistent cough.  As part of today's evaluation I have reviewed his hospital records including his labs and his H&P and discharge summary.  No Known Allergies  Current Outpatient Prescriptions  Medication Sig Dispense Refill  . carvedilol (COREG) 12.5 MG tablet Take 12.5 mg by mouth 2 (two) times daily.        Marland Kitchen diltiazem (CARDIZEM CD) 180 MG 24 hr capsule Take 180 mg by mouth daily.        . isosorbide mononitrate (IMDUR) 30 MG 24 hr tablet Take 1 tablet by mouth daily.      Marland Kitchen levothyroxine (SYNTHROID, LEVOTHROID) 75 MCG tablet Take 75 mcg by mouth daily before breakfast.      . magnesium gluconate (MAGONATE) 500 MG tablet Take 500 mg by mouth daily.       . Melatonin 1 MG CAPS Take 1 capsule by mouth at bedtime as needed.      . nitroGLYCERIN (NITROSTAT) 0.4 MG SL tablet Place 0.4 mg under the tongue every 5 (five) minutes as needed. For chest pains. May repeat for up to 3 doses.      . ranitidine (ZANTAC) 150 MG tablet Take 1 tablet by mouth 2 (two) times daily.      . simvastatin (ZOCOR) 10 MG tablet Take 10 mg by mouth at bedtime.       . Tamsulosin HCl (FLOMAX) 0.4 MG CAPS Take  0.4 mg by mouth every evening.       . warfarin (COUMADIN) 2.5 MG tablet Take 2.5 mg by mouth daily. 2.5 mg every day except Sunday, Tues, Thurs, Sat 3.75 mg       No current facility-administered medications for this visit.    History   Social History  . Marital Status: Married    Spouse Name: N/A    Number of Children: N/A  . Years of Education: N/A   Occupational History  . Retired    Social History Main Topics  . Smoking status: Never Smoker   . Smokeless tobacco: Never Used  . Alcohol Use: No  . Drug Use: No  . Sexual Activity: Not on file   Other Topics Concern  . Not on file   Social History Narrative   Married    Family History  Problem Relation Age of Onset  . Colon cancer Mother   . Heart disease Father   . Stroke Brother   . Healthy Daughter     Past Medical History  Diagnosis Date  . Other malaise and fatigue   . CAD (coronary artery disease)     Catheterization 2004, mild/moderate nonobstructive  disease  /   nuclear, 2007, small inferior scar // no ischemia  . Atrial fibrillation     Permanent  . GERD (gastroesophageal reflux disease)   . Clot     LA, small, in past  . Ejection fraction 02/2009    EF 55-60%,Echo, May, 2010  . LVH (left ventricular hypertrophy) 02/2009     Mild, echo, 2010  . Mitral regurgitation     Mild, echo, flat closure  . Elevated PSA   . Nausea vomiting and diarrhea 01/2010    Hospitalization, probably viral (or partial small bowel obstruction)/ Hospital 09/2010, recurrent possible partial small bowel obstruction, medical therapy  . Dizziness 03/2010    June, 2011   Mild, stop digoxin, felt better  . Carotid bruit     Doppler, December 23, 2010, no significant carotid disease  . Warfarin anticoagulation     Atrial fibrillation    Past Surgical History  Procedure Laterality Date  . Cardiac catheterization  2004  . Colonoscopy  2008    [facility]  . Cholecystectomy  2010    Dr. Anthony Sar  . Agile capsule  10/18/2011     Procedure: AGILE CAPSULE;  Surgeon: Rogene Houston, MD;  Location: [facility];  Service: Endoscopy;  Laterality: N/A;  730  . Esophagogastroduodenoscopy  11/08/2011    Procedure: ESOPHAGOGASTRODUODENOSCOPY (EGD);  Surgeon: Rogene Houston, MD;  [facility]: [facility];  Service: Endoscopy;  Laterality: N/A;  300    Patient Active Problem List   Diagnosis Date Noted  . Warfarin anticoagulation     Priority: High  . Ejection fraction 02/13/2009    Priority: High  . Hx SBO 09/18/2011  . Carotid bruit   . Other malaise and fatigue   . CAD (coronary artery disease)   . Atrial fibrillation   . GERD (gastroesophageal reflux disease)   . Clot   . Mitral regurgitation   . Dizziness 03/16/2010  . Nausea vomiting and diarrhea 01/14/2010    ROS   Patient denies fever, chills, , sweats, rash, change in vision, change in hearing, chest pain, nausea or vomiting, urinary symptoms. All other systems are reviewed and are negative.  PHYSICAL EXAM  Patient is here with his wife as always. She is helpful with the history. He is oriented to person time and place. Affect is normal. There is no jugulovenous distention. Lungs are clear. Respiratory effort is nonlabored. Cardiac exam reveals S1 and S2. There no clicks or significant murmurs. Abdomen is soft. There is no peripheral edema. There no musculoskeletal deformities. There are no skin rashes.  Filed Vitals:   12/01/13 0848  BP: 103/62  Pulse: 93  Height: 5' 8"  (1.727 m)  Weight: 167 lb (75.751 kg)  SpO2: 97%   EKG is done today. He does have decreased R wave in V1 and V2. I have compared this to his recent hospital EKG and prior EKGs and there is no diagnostic change.  ASSESSMENT & PLAN

## 2013-12-01 NOTE — Assessment & Plan Note (Signed)
He has mild mitral regurgitation by echo, June, 2014. He does not need a followup echo at this time.

## 2013-12-01 NOTE — Assessment & Plan Note (Addendum)
Echo in June, 2014 revealed an a EF of 55-60%.

## 2013-12-01 NOTE — Assessment & Plan Note (Signed)
Coumadin is continued for his atrial fibrillation. No change in therapy.

## 2013-12-01 NOTE — Assessment & Plan Note (Addendum)
Historically he's had mild/moderate nonobstructive disease by catheterization in 2004. He said nuclear studies in [id number]. There was question of small inferior scar in the past but no ischemia. He needs a followup nuclear scan because of his recent chest discomfort. This will be arranged as an outpatient. Because of his headaches he will not continue his Imdur.  As part of today's evaluation I spent greater than 25 minutes with his total care. More than half of this time has been with direct contact with the patient and his wife concerning his current complete evaluation.

## 2013-12-01 NOTE — Patient Instructions (Addendum)
Your physician recommends that you schedule a follow-up appointment in: March 2015. Your physician has recommended you make the following change in your medication: Stop Isosorbide mononitrate. All other medications will remain the same. Your physician has requested that you have a lexiscan myoview. For further information please visit [email or web address]. Please follow instruction sheet, as given.

## 2013-12-01 NOTE — Assessment & Plan Note (Signed)
Atrial fibrillation rate is controlled. No change in therapy. 

## 2013-12-12 ENCOUNTER — Encounter (HOSPITAL_COMMUNITY): Payer: Medicare HMO

## 2013-12-12 ENCOUNTER — Other Ambulatory Visit (HOSPITAL_COMMUNITY): Payer: Medicare HMO

## 2013-12-17 ENCOUNTER — Encounter (HOSPITAL_COMMUNITY): Payer: Self-pay

## 2013-12-17 ENCOUNTER — Encounter (HOSPITAL_COMMUNITY)
Admission: RE | Admit: 2013-12-17 | Discharge: 2013-12-17 | Disposition: A | Discharge status: Home / Self Care | Payer: Medicare HMO | Source: Ambulatory Visit | Attending: Cardiology | Admitting: Cardiology

## 2013-12-17 DIAGNOSIS — I4891 Unspecified atrial fibrillation: Secondary | ICD-10-CM

## 2013-12-17 DIAGNOSIS — I2510 Atherosclerotic heart disease of native coronary artery without angina pectoris: Secondary | ICD-10-CM | POA: Insufficient documentation

## 2013-12-17 DIAGNOSIS — R9430 Abnormal result of cardiovascular function study, unspecified: Secondary | ICD-10-CM

## 2013-12-17 DIAGNOSIS — R0989 Other specified symptoms and signs involving the circulatory and respiratory systems: Secondary | ICD-10-CM | POA: Insufficient documentation

## 2013-12-17 DIAGNOSIS — I340 Nonrheumatic mitral (valve) insufficiency: Secondary | ICD-10-CM

## 2013-12-17 DIAGNOSIS — I059 Rheumatic mitral valve disease, unspecified: Secondary | ICD-10-CM | POA: Insufficient documentation

## 2013-12-17 MED ORDER — SODIUM CHLORIDE 0.9 % IJ SOLN
INTRAMUSCULAR | Status: AC
  Administered 2013-12-17: 10 mL via INTRAVENOUS
  Filled 2013-12-17: qty 10

## 2013-12-17 MED ORDER — TECHNETIUM TC 99M SESTAMIBI GENERIC - CARDIOLITE
10.0000 | Freq: Once | INTRAVENOUS | Status: AC | PRN
  Administered 2013-12-17: 10 via INTRAVENOUS

## 2013-12-17 MED ORDER — TECHNETIUM TC 99M SESTAMIBI - CARDIOLITE
30.0000 | Freq: Once | INTRAVENOUS | Status: AC | PRN
  Administered 2013-12-17: 11:00:00 30 via INTRAVENOUS

## 2013-12-17 MED ORDER — REGADENOSON 0.4 MG/5ML IV SOLN
INTRAVENOUS | Status: AC
  Administered 2013-12-17: 0.4 mg via INTRAVENOUS
  Filled 2013-12-17: qty 5

## 2013-12-17 NOTE — Progress Notes (Signed)
Stress Lab Nurses Notes - [facility]  LEEVI CULLARS 12/17/2013 Reason for doing test: CAD and AFib Type of test: Wille Glaser Nurse performing test: Gerrit Halls, RN Nuclear Medicine Tech: Redmond Baseman Echo Tech: Not Applicable MD performing test: S. McDowell/K.Armen Pickup MD: Dr. Quintin Alto Test explained and consent signed: yes IV started: 22g jelco, Saline lock flushed, No redness or edema and Saline lock started in radiology Symptoms: SOB Treatment/Intervention: None Reason test stopped: protocol completed After recovery IV was: Discontinued via X-ray tech and No redness or edema Patient to return to [facility]. Med at : 11:15 Patient discharged: Home Patient's Condition upon discharge was: stable Comments: During test BP 121/69 & HR 102.  Recovery BP 124/72 & HR 84.  Symptoms resolved in recovery. Geanie Cooley T

## 2013-12-19 ENCOUNTER — Encounter: Payer: Self-pay | Admitting: Cardiology

## 2013-12-23 ENCOUNTER — Telehealth: Payer: Self-pay | Admitting: *Deleted

## 2013-12-23 NOTE — Telephone Encounter (Signed)
Message copied by Merlene Laughter on Tue Dec 23, 2013  8:50 AM ------      Message from: Ron Parker, [place] D      Created: Fri Dec 19, 2013  5:19 PM       Please let the patient know that his nuclear test looks good. There is no significant change. We do not need to do any further workup at this time. ------

## 2013-12-23 NOTE — Telephone Encounter (Signed)
Patient informed. 

## 2013-12-26 ENCOUNTER — Ambulatory Visit (INDEPENDENT_AMBULATORY_CARE_PROVIDER_SITE_OTHER): Payer: Medicare HMO | Admitting: *Deleted

## 2013-12-26 DIAGNOSIS — Z7901 Long term (current) use of anticoagulants: Secondary | ICD-10-CM

## 2013-12-26 DIAGNOSIS — Z1159 Encounter for screening for other viral diseases: Secondary | ICD-10-CM | POA: Insufficient documentation

## 2013-12-26 DIAGNOSIS — Z5181 Encounter for therapeutic drug level monitoring: Secondary | ICD-10-CM

## 2013-12-26 DIAGNOSIS — I4891 Unspecified atrial fibrillation: Secondary | ICD-10-CM

## 2013-12-26 LAB — POCT INR: INR: 5.5

## 2014-01-02 ENCOUNTER — Ambulatory Visit (INDEPENDENT_AMBULATORY_CARE_PROVIDER_SITE_OTHER): Payer: Commercial Managed Care - HMO | Admitting: *Deleted

## 2014-01-02 DIAGNOSIS — Z7901 Long term (current) use of anticoagulants: Secondary | ICD-10-CM

## 2014-01-02 DIAGNOSIS — Z5181 Encounter for therapeutic drug level monitoring: Secondary | ICD-10-CM

## 2014-01-02 DIAGNOSIS — I4891 Unspecified atrial fibrillation: Secondary | ICD-10-CM

## 2014-01-02 LAB — POCT INR: INR: 1.8

## 2014-01-09 ENCOUNTER — Ambulatory Visit (INDEPENDENT_AMBULATORY_CARE_PROVIDER_SITE_OTHER): Payer: Commercial Managed Care - HMO | Admitting: *Deleted

## 2014-01-09 DIAGNOSIS — Z5181 Encounter for therapeutic drug level monitoring: Secondary | ICD-10-CM

## 2014-01-09 DIAGNOSIS — Z7901 Long term (current) use of anticoagulants: Secondary | ICD-10-CM

## 2014-01-09 DIAGNOSIS — I4891 Unspecified atrial fibrillation: Secondary | ICD-10-CM

## 2014-01-09 LAB — POCT INR: INR: 3.8

## 2014-01-30 ENCOUNTER — Ambulatory Visit (INDEPENDENT_AMBULATORY_CARE_PROVIDER_SITE_OTHER): Payer: Commercial Managed Care - HMO | Admitting: *Deleted

## 2014-01-30 DIAGNOSIS — Z7901 Long term (current) use of anticoagulants: Secondary | ICD-10-CM

## 2014-01-30 DIAGNOSIS — I4891 Unspecified atrial fibrillation: Secondary | ICD-10-CM

## 2014-01-30 DIAGNOSIS — Z5181 Encounter for therapeutic drug level monitoring: Secondary | ICD-10-CM

## 2014-01-30 LAB — POCT INR: INR: 3

## 2014-02-27 ENCOUNTER — Ambulatory Visit (INDEPENDENT_AMBULATORY_CARE_PROVIDER_SITE_OTHER): Payer: Commercial Managed Care - HMO | Admitting: *Deleted

## 2014-02-27 DIAGNOSIS — Z5181 Encounter for therapeutic drug level monitoring: Secondary | ICD-10-CM

## 2014-02-27 DIAGNOSIS — I4891 Unspecified atrial fibrillation: Secondary | ICD-10-CM

## 2014-02-27 DIAGNOSIS — Z7901 Long term (current) use of anticoagulants: Secondary | ICD-10-CM

## 2014-02-27 LAB — POCT INR: INR: 2.9

## 2014-03-06 ENCOUNTER — Ambulatory Visit (INDEPENDENT_AMBULATORY_CARE_PROVIDER_SITE_OTHER): Payer: Commercial Managed Care - HMO | Admitting: *Deleted

## 2014-03-06 DIAGNOSIS — Z7901 Long term (current) use of anticoagulants: Secondary | ICD-10-CM

## 2014-03-06 DIAGNOSIS — Z5181 Encounter for therapeutic drug level monitoring: Secondary | ICD-10-CM

## 2014-03-06 DIAGNOSIS — I4891 Unspecified atrial fibrillation: Secondary | ICD-10-CM

## 2014-03-06 LAB — POCT INR: INR: 3.1

## 2014-03-20 ENCOUNTER — Ambulatory Visit (INDEPENDENT_AMBULATORY_CARE_PROVIDER_SITE_OTHER): Payer: Commercial Managed Care - HMO | Admitting: Internal Medicine

## 2014-03-20 ENCOUNTER — Encounter: Payer: Self-pay | Admitting: Internal Medicine

## 2014-03-20 VITALS — BP 94/58 | HR 62 | Temp 98.9°F | Ht 68.0 in | Wt 165.0 lb

## 2014-03-20 DIAGNOSIS — R058 Other specified cough: Secondary | ICD-10-CM

## 2014-03-20 DIAGNOSIS — R05 Cough: Secondary | ICD-10-CM

## 2014-03-20 DIAGNOSIS — J984 Other disorders of lung: Secondary | ICD-10-CM

## 2014-03-20 DIAGNOSIS — R059 Cough, unspecified: Secondary | ICD-10-CM

## 2014-03-20 MED ORDER — RANITIDINE HCL 150 MG PO TABS: ORAL_TABLET | ORAL | Status: DC

## 2014-03-20 MED ORDER — OMEPRAZOLE 20 MG PO CPDR: DELAYED_RELEASE_CAPSULE | ORAL | Status: DC

## 2014-03-20 MED ORDER — PREDNISONE 10 MG PO TABS: ORAL_TABLET | ORAL | Status: DC

## 2014-03-20 NOTE — Patient Instructions (Addendum)
prilosec /omeprazole 20 x 30 min before bfast and zantac (ranitidine) 150 mg 2 at bedtime  Prednisone 10 mg take  4 each am x 2 days,   2 each am x 2 days,  1 each am x 2 days and stop   GERD (REFLUX)  is an extremely common cause of respiratory symptoms, many times with no significant heartburn at all.    It can be treated with medication, but also with lifestyle changes including avoidance of late meals, excessive alcohol, smoking cessation, and avoid fatty foods, chocolate, peppermint, colas, red wine, and acidic juices such as orange juice.  NO MINT OR MENTHOL PRODUCTS SO NO COUGH DROPS  USE SUGARLESS CANDY INSTEAD (jolley ranchers or Stover's)  NO OIL BASED VITAMINS - use powdered substitutes.   Please schedule a follow up office visit in 2 weeks, sooner if needed   with all your medications in hand Add:  Need to review prev xrays on return

## 2014-03-20 NOTE — Progress Notes (Signed)
   Subjective:    Patient ID: [name], male    DOB: 12/17/1930     MRN: [id number]  HPI  72 yowm never smoker never resp problems only stopped jogging x 2010 on Dr Kae Heller rec for AF but then started noticing chest pressure 1st of Jan 2015 > [facility] > ? pna > no treatment then onset cough in Feb 2015 dx ? Allergy > rx not effective and rx zpak/prednisone helped some then worse again so referred by Dr Quintin Alto to pulmonary clinic  03/22/2014     03/20/2014 1st [facility] office visit/ Kileen Lange  Chief Complaint  Patient presents with  . Pulmonary Consult    Referred per Dr. Quintin Alto. Pt c/o cough x 2 months- non prod.  Cough is esp worse at night. He also c/o DOE with strenuous exercise.  onset of dry cough Feb 2015, only thing helped was prednisone, ? proaire helped some  Worse at hs assoc with severe hoarseness esp in am and some overt hb on variable doses of ppi and h2 but not consistently  No obvious other patterns in day to day or daytime variabilty or assoc cp or chest tightness, subjective wheeze overt sinus or hb symptoms. No unusual exp hx or h/o childhood pna/ asthma or knowledge of premature birth.    Also denies any obvious fluctuation of symptoms with weather or environmental changes or other aggravating or alleviating factors except as outlined above   Current Medications, Allergies, Complete Past Medical History, Past Surgical History, Family History, and Social History were reviewed in [employer] record.           Review of Systems  Constitutional: Negative for fever, chills, activity change, appetite change and unexpected weight change.  HENT: Positive for congestion. Negative for dental problem, postnasal drip, rhinorrhea, sneezing, sore throat, trouble swallowing and voice change.   Eyes: Negative for visual disturbance.  Respiratory: Positive for cough and shortness of breath. Negative for choking.   Cardiovascular: Negative for chest pain and  leg swelling.  Gastrointestinal: Negative for nausea, vomiting and abdominal pain.  Genitourinary: Negative for difficulty urinating.       Heartburn Indigestion  Musculoskeletal: Negative for arthralgias.  Skin: Negative for rash.  Psychiatric/Behavioral: Negative for behavioral problems and confusion.       Objective:   Physical Exam  amb very hoarse wm nad  Wt Readings from Last 3 Encounters:  03/20/14 165 lb (74.844 kg)  12/01/13 167 lb (75.751 kg)  04/02/13 170 lb (77.111 kg)      HEENT: nl dentition, turbinates, and orophanx. Nl external ear canals without cough reflex   NECK :  without JVD/Nodes/TM/ nl carotid upstrokes bilaterally   LUNGS: no acc muscle use, clear to A and P bilaterally without cough on insp or exp maneuvers   CV:  RRR  no s3 or murmur or increase in P2, no edema   ABD:  soft and nontender with nl excursion in the supine position. No bruits or organomegaly, bowel sounds nl  MS:  warm without deformities, calf tenderness, cyanosis or clubbing  SKIN: warm and dry without lesions    NEURO:  alert, approp, no deficits    CXR 03/02/14 ok      Assessment & Plan:

## 2014-03-22 DIAGNOSIS — R05 Cough: Secondary | ICD-10-CM | POA: Insufficient documentation

## 2014-03-22 DIAGNOSIS — R058 Other specified cough: Secondary | ICD-10-CM | POA: Insufficient documentation

## 2014-03-22 DIAGNOSIS — J984 Other disorders of lung: Secondary | ICD-10-CM | POA: Insufficient documentation

## 2014-03-22 NOTE — Assessment & Plan Note (Addendum)
The most common causes of chronic cough in immunocompetent adults include the following: upper airway cough syndrome (UACS), previously referred to as postnasal drip syndrome (PNDS), which is caused by variety of rhinosinus conditions; (2) asthma; (3) GERD; (4) chronic bronchitis from cigarette smoking or other inhaled environmental irritants; (5) nonasthmatic eosinophilic bronchitis; and (6) bronchiectasis.   These conditions, singly or in combination, have accounted for up to 94% of the causes of chronic cough in prospective studies.   Other conditions have constituted no >6% of the causes in prospective studies These have included bronchogenic carcinoma, chronic interstitial pneumonia, sarcoidosis, left ventricular failure, ACEI-induced cough, and aspiration from a condition associated with pharyngeal dysfunction.    Chronic cough is often simultaneously caused by more than one condition. A single cause has been found from 38 to 82% of the time, multiple causes from 18 to 62%. Multiply caused cough has been the result of three diseases up to 42% of the time.       Based on hx and exam, this is most likely either cough variant asthma or  Upper airway cough syndrome, so named because it's frequently impossible to sort out how much is  CR/sinusitis with freq throat clearing (which can be related to primary GERD)   vs  causing  secondary (" extra esophageal")  GERD from wide swings in gastric pressure that occur with throat clearing, often  promoting self use of mint and menthol lozenges that reduce the lower esophageal sphincter tone and exacerbate the problem further in a cyclical fashion.   These are the same pts (now being labeled as having "irritable larynx syndrome" by some cough centers) who not infrequently have a history of having failed to tolerate ace inhibitors,  dry powder inhalers or biphosphonates or report having atypical reflux symptoms that don't respond to standard doses of PPI , and  are easily confused as having aecopd or asthma flares by even experienced allergists/ pulmonologists.   The first step is to maximize acid suppression and rechallenge with very short course of prednisone then regroup in two weeks   See instructions for specific recommendations which were reviewed directly with the patient who was given a copy with highlighter outlining the key components.

## 2014-03-22 NOTE — Assessment & Plan Note (Signed)
This dx was on the referral form but I see no evidence for significant ILD on today's exam and will review available studies in two weeks on return

## 2014-03-23 ENCOUNTER — Encounter: Payer: Self-pay | Admitting: Internal Medicine

## 2014-03-24 ENCOUNTER — Telehealth: Payer: Self-pay | Admitting: *Deleted

## 2014-03-24 NOTE — Telephone Encounter (Signed)
Pt called.  Saw Pulmonary MD on Friday.  Was started on prednisone 60m x 2 days, 289mx 2 days, 1044m 2 days.  Is starting 58m37mday.  Told pt to hold coumadin tonight then resume regular dose and come for INR check on 03/27/14.  Pt verbalized understanding.

## 2014-03-27 ENCOUNTER — Ambulatory Visit (INDEPENDENT_AMBULATORY_CARE_PROVIDER_SITE_OTHER): Payer: Commercial Managed Care - HMO | Admitting: *Deleted

## 2014-03-27 DIAGNOSIS — Z7901 Long term (current) use of anticoagulants: Secondary | ICD-10-CM

## 2014-03-27 DIAGNOSIS — Z5181 Encounter for therapeutic drug level monitoring: Secondary | ICD-10-CM

## 2014-03-27 DIAGNOSIS — I4891 Unspecified atrial fibrillation: Secondary | ICD-10-CM

## 2014-03-27 LAB — POCT INR: INR: 2.3

## 2014-04-03 ENCOUNTER — Other Ambulatory Visit (INDEPENDENT_AMBULATORY_CARE_PROVIDER_SITE_OTHER): Payer: Commercial Managed Care - HMO

## 2014-04-03 ENCOUNTER — Ambulatory Visit (INDEPENDENT_AMBULATORY_CARE_PROVIDER_SITE_OTHER): Payer: Commercial Managed Care - HMO | Admitting: Internal Medicine

## 2014-04-03 ENCOUNTER — Encounter: Payer: Self-pay | Admitting: Internal Medicine

## 2014-04-03 VITALS — BP 118/70 | HR 96 | Temp 98.0°F | Ht 68.0 in | Wt 165.2 lb

## 2014-04-03 DIAGNOSIS — R058 Other specified cough: Secondary | ICD-10-CM

## 2014-04-03 DIAGNOSIS — R05 Cough: Secondary | ICD-10-CM

## 2014-04-03 DIAGNOSIS — J984 Other disorders of lung: Secondary | ICD-10-CM

## 2014-04-03 DIAGNOSIS — R059 Cough, unspecified: Secondary | ICD-10-CM

## 2014-04-03 LAB — CBC WITH DIFFERENTIAL/PLATELET
BASOS PCT: 0.4 % (ref 0.0–3.0)
Basophils Absolute: 0 10*3/uL (ref 0.0–0.1)
Eosinophils Absolute: 0.1 10*3/uL (ref 0.0–0.7)
Eosinophils Relative: 0.6 % (ref 0.0–5.0)
HEMATOCRIT: 45.6 % (ref 39.0–52.0)
HEMOGLOBIN: 15.2 g/dL (ref 13.0–17.0)
LYMPHS ABS: 1.7 10*3/uL (ref 0.7–4.0)
Lymphocytes Relative: 20.8 % (ref 12.0–46.0)
MCHC: 33.3 g/dL (ref 30.0–36.0)
MCV: 88 fl (ref 78.0–100.0)
MONO ABS: 0.6 10*3/uL (ref 0.1–1.0)
MONOS PCT: 7.5 % (ref 3.0–12.0)
NEUTROS ABS: 5.6 10*3/uL (ref 1.4–7.7)
Neutrophils Relative %: 70.7 % (ref 43.0–77.0)
Platelets: 250 10*3/uL (ref 150.0–400.0)
RBC: 5.18 Mil/uL (ref 4.22–5.81)
RDW: 15.4 % (ref 11.5–15.5)
WBC: 8 10*3/uL (ref 4.0–10.5)

## 2014-04-03 LAB — SEDIMENTATION RATE: SED RATE: 18 mm/h (ref 0–22)

## 2014-04-03 NOTE — Patient Instructions (Addendum)
Please see patient coordinator before you leave today  to schedule sinus ct  Please remember to go to the lab   department downstairs for your tests - we will call you with the results when they are available.  No change in medications for now as they seem to be helping  Please schedule a follow up office visit in 6 weeks, call sooner if needed with final follow up cxr

## 2014-04-03 NOTE — Progress Notes (Signed)
Subjective:    Patient ID: [name], male    DOB: 05-06-1931     MRN: [id number]    Brief patient profile:  89 yowm never smoker never resp problems only stopped jogging x 2010 on Dr Kae Heller rec for AF but then started noticing chest pressure 1st of Jan 2015 > [facility] > ? pna > no treatment then onset cough in Feb 2015 dx ? Allergy > rx not effective and rx zpak/prednisone helped some then worse again so referred by Dr Quintin Alto to pulmonary clinic  03/22/2014    History of Present Illness  03/20/2014 1st [facility] office visit/ Wert  Chief Complaint  Patient presents with  . Pulmonary Consult    Referred per Dr. Quintin Alto. Pt c/o cough x 2 months- non prod.  Cough is esp worse at night. He also c/o DOE with strenuous exercise.  onset of dry cough Feb 2015, only thing helped was prednisone, ? proaire helped some  Worse at hs assoc with severe hoarseness esp in am and some overt hb on variable doses of ppi and h2 but not consistently rec prilosec /omeprazole 20 x 30 min before bfast and zantac (ranitidine) 150 mg 2 at bedtime Prednisone 10 mg take  4 each am x 2 days,   2 each am x 2 days,  1 each am x 2 days and stop  GERD diet   04/03/2014 f/u ov/Wert re: chronic cough x 6 m resolved back to baseline  Chief Complaint  Patient presents with  . Follow-up    Pt reports that cough is resolved. Still having increased hoarseness.   Not limited by breathing from desired activities   Hoarseness is main concern No need for cough meds now   No obvious day to day or daytime variabilty or assoc   cp or chest tightness, subjective wheeze overt sinus or hb symptoms. No unusual exp hx or h/o childhood pna/ asthma or knowledge of premature birth.  Sleeping ok without nocturnal  or early am exacerbation  of respiratory  c/o's or need for noct saba. Also denies any obvious fluctuation of symptoms with weather or environmental changes or other aggravating or alleviating factors except as outlined  above   Current Medications, Allergies, Complete Past Medical History, Past Surgical History, Family History, and Social History were reviewed in [employer] record.  ROS  The following are not active complaints unless bolded sore throat, dysphagia, dental problems, itching, sneezing,  nasal congestion or excess/ purulent secretions, ear ache,   fever, chills, sweats, unintended wt loss, pleuritic or exertional cp, hemoptysis,  orthopnea pnd or leg swelling, presyncope, palpitations, heartburn, abdominal pain, anorexia, nausea, vomiting, diarrhea  or change in bowel or urinary habits, change in stools or urine, dysuria,hematuria,  rash, arthralgias, visual complaints, headache, numbness weakness or ataxia or problems with walking or coordination,  change in mood/affect or memory.                               Objective:   Physical Exam  amb mod hoarse wm nad  04/03/2014        165  Wt Readings from Last 3 Encounters:  03/20/14 165 lb (74.844 kg)  12/01/13 167 lb (75.751 kg)  04/02/13 170 lb (77.111 kg)      HEENT: nl dentition, turbinates, and orophanx. Nl external ear canals without cough reflex   NECK :  without JVD/Nodes/TM/ nl  carotid upstrokes bilaterally   LUNGS: no acc muscle use, clear to A and P bilaterally without cough on insp or exp maneuvers   CV:  RRR  no s3 or murmur or increase in P2, no edema   ABD:  soft and nontender with nl excursion in the supine position. No bruits or organomegaly, bowel sounds nl  MS:  warm without deformities, calf tenderness, cyanosis or clubbing  SKIN: warm and dry without lesions    NEURO:  alert, approp, no deficits    CXR 03/02/14 ok      Assessment & Plan:

## 2014-04-06 ENCOUNTER — Telehealth: Payer: Self-pay | Admitting: Cardiology

## 2014-04-06 NOTE — Telephone Encounter (Signed)
Per Mikala with [facility] back and fax.   No Pre -authorization is needed for his yearly f/u or coumadin appts

## 2014-04-06 NOTE — Assessment & Plan Note (Signed)
Needs f/u cxr on return

## 2014-04-06 NOTE — Assessment & Plan Note (Signed)
-   max gerd rx 03/20/14 > improved 04/03/14   Clearly improved cough though still some residual hoarseness and no pulmonary problem identified though he does have restrictive changes on spirometry so needs   1) no change rx 2) f/u for final cxr in 6 wk 3) allegy profile, sinus ct and maybe ENT eval for hoarseness but not likely to be helpful     Each maintenance medication was reviewed in detail including most importantly the difference between maintenance and as needed and under what circumstances the prns are to be used.  Please see instructions for details which were reviewed in writing and the patient given a copy.

## 2014-04-08 ENCOUNTER — Ambulatory Visit (INDEPENDENT_AMBULATORY_CARE_PROVIDER_SITE_OTHER)
Admission: RE | Admit: 2014-04-08 | Discharge: 2014-04-08 | Disposition: A | Discharge status: Home / Self Care | Payer: Commercial Managed Care - HMO | Source: Ambulatory Visit | Attending: Internal Medicine | Admitting: Internal Medicine

## 2014-04-08 DIAGNOSIS — R058 Other specified cough: Secondary | ICD-10-CM

## 2014-04-08 DIAGNOSIS — R05 Cough: Secondary | ICD-10-CM

## 2014-04-08 DIAGNOSIS — R059 Cough, unspecified: Secondary | ICD-10-CM

## 2014-04-09 ENCOUNTER — Encounter: Payer: Self-pay | Admitting: Internal Medicine

## 2014-04-09 NOTE — Progress Notes (Signed)
Quick Note:  Spoke with pt and notified of results per Dr. Wert. Pt verbalized understanding and denied any questions.  ______ 

## 2014-04-24 ENCOUNTER — Ambulatory Visit (INDEPENDENT_AMBULATORY_CARE_PROVIDER_SITE_OTHER): Payer: Commercial Managed Care - HMO | Admitting: *Deleted

## 2014-04-24 DIAGNOSIS — I4891 Unspecified atrial fibrillation: Secondary | ICD-10-CM

## 2014-04-24 DIAGNOSIS — Z5181 Encounter for therapeutic drug level monitoring: Secondary | ICD-10-CM

## 2014-04-24 DIAGNOSIS — Z7901 Long term (current) use of anticoagulants: Secondary | ICD-10-CM

## 2014-04-24 DIAGNOSIS — I482 Chronic atrial fibrillation, unspecified: Secondary | ICD-10-CM

## 2014-04-24 LAB — POCT INR: INR: 2.8

## 2014-05-18 ENCOUNTER — Encounter: Payer: Self-pay | Admitting: Internal Medicine

## 2014-05-18 ENCOUNTER — Ambulatory Visit (INDEPENDENT_AMBULATORY_CARE_PROVIDER_SITE_OTHER)
Admission: RE | Admit: 2014-05-18 | Discharge: 2014-05-18 | Disposition: A | Discharge status: Home / Self Care | Payer: Commercial Managed Care - HMO | Source: Ambulatory Visit | Attending: Internal Medicine | Admitting: Internal Medicine

## 2014-05-18 ENCOUNTER — Ambulatory Visit (INDEPENDENT_AMBULATORY_CARE_PROVIDER_SITE_OTHER): Payer: Commercial Managed Care - HMO | Admitting: Internal Medicine

## 2014-05-18 VITALS — BP 104/70 | HR 85 | Temp 97.5°F | Ht 68.0 in | Wt 165.8 lb

## 2014-05-18 DIAGNOSIS — R058 Other specified cough: Secondary | ICD-10-CM

## 2014-05-18 DIAGNOSIS — R059 Cough, unspecified: Secondary | ICD-10-CM

## 2014-05-18 DIAGNOSIS — R05 Cough: Secondary | ICD-10-CM

## 2014-05-18 NOTE — Patient Instructions (Signed)
Ok to taper your acid suppression but for any flare of hoarseness or cough go back to maximum doses  Pulmonary follow up is as needed

## 2014-05-18 NOTE — Progress Notes (Signed)
Subjective:    Patient ID: [name], male    DOB: August 18, 1931     MRN: [id number]    Brief patient profile:  25 yowm never smoker never resp problems only stopped jogging x 2010 on Dr Kae Heller rec for AF but then started noticing chest pressure 1st of Jan 2015 > [facility] > ? pna > no treatment then onset cough in Feb 2015 dx ? Allergy > rx not effective and rx zpak/prednisone helped some then worse again so referred by Dr Quintin Alto to pulmonary clinic  03/22/2014    History of Present Illness  03/20/2014 1st [facility] office visit/ [name]  Chief Complaint  Patient presents with  . Pulmonary Consult    Referred per Dr. Quintin Alto. Pt c/o cough x 2 months- non prod.  Cough is esp worse at night. He also c/o DOE with strenuous exercise.  onset of dry cough Feb 2015, only thing helped was prednisone, ? proaire helped some  Worse at hs assoc with severe hoarseness esp in am and some overt hb on variable doses of ppi and h2 but not consistently rec prilosec /omeprazole 20 x 30 min before bfast and zantac (ranitidine) 150 mg 2 at bedtime Prednisone 10 mg take  4 each am x 2 days,   2 each am x 2 days,  1 each am x 2 days and stop  GERD diet   04/03/2014 f/u ov/[name] re: chronic cough x 6 m resolved back to baseline  Chief Complaint  Patient presents with  . Follow-up    Pt reports that cough is resolved. Still having increased hoarseness.   Not limited by breathing from desired activities   Hoarseness is main concern No need for cough meds now rec  schedule sinus ct> neg  No change in medications for now as they seem to be helping   05/18/2014 f/u ov/[name] re: chronic cough/doe resolved  Chief Complaint  Patient presents with  . Follow-up    Pt states that he is doing well and denies any new co's today.  Hoarsness has resovled.   hips and back stops pt before breathing     No obvious day to day or daytime variabilty or assoc   cp or chest tightness, subjective wheeze overt sinus or hb  symptoms. No unusual exp hx or h/o childhood pna/ asthma or knowledge of premature birth.  Sleeping ok without nocturnal  or early am exacerbation  of respiratory  c/o's or need for noct saba. Also denies any obvious fluctuation of symptoms with weather or environmental changes or other aggravating or alleviating factors except as outlined above   Current Medications, Allergies, Complete Past Medical History, Past Surgical History, Family History, and Social History were reviewed in [employer] record.  ROS  The following are not active complaints unless bolded sore throat, dysphagia, dental problems, itching, sneezing,  nasal congestion or excess/ purulent secretions, ear ache,   fever, chills, sweats, unintended wt loss, pleuritic or exertional cp, hemoptysis,  orthopnea pnd or leg swelling, presyncope, palpitations, heartburn, abdominal pain, anorexia, nausea, vomiting, diarrhea  or change in bowel or urinary habits, change in stools or urine, dysuria,hematuria,  rash, arthralgias, visual complaints, headache, numbness weakness or ataxia or problems with walking or coordination,  change in mood/affect or memory.                               Objective:  Physical Exam  amb mildly  hoarse wm nad = this way for years per wife  04/03/2014        165 > 05/18/2014   166  Wt Readings from Last 3 Encounters:  03/20/14 165 lb (74.844 kg)  12/01/13 167 lb (75.751 kg)  04/02/13 170 lb (77.111 kg)      HEENT: nl dentition, turbinates, and orophanx. Nl external ear canals without cough reflex   NECK :  without JVD/Nodes/TM/ nl carotid upstrokes bilaterally   LUNGS: no acc muscle use, clear to A and P bilaterally without cough on insp or exp maneuvers   CV:  RRR  no s3 or murmur or increase in P2, no edema   ABD:  soft and nontender with nl excursion in the supine position. No bruits or organomegaly, bowel sounds nl  MS:  warm without deformities, calf tenderness,  cyanosis or clubbing  SKIN: warm and dry without lesions    NEURO:  alert, approp, no deficits      CXR  05/18/2014 : No active cardiopulmonary disease. Degenerative changes thoracic spine.        Assessment & Plan:   Outpatient Encounter Prescriptions as of 05/18/2014  Medication Sig  . carvedilol (COREG) 12.5 MG tablet Take 12.5 mg by mouth 2 (two) times daily.    Marland Kitchen diltiazem (CARDIZEM CD) 180 MG 24 hr capsule Take 180 mg by mouth daily.    Marland Kitchen levothyroxine (SYNTHROID, LEVOTHROID) 75 MCG tablet Take 75 mcg by mouth daily before breakfast.  . magnesium gluconate (MAGONATE) 500 MG tablet Take 500 mg by mouth daily.   . Melatonin 1 MG CAPS Take 3 mg by mouth at bedtime as needed.   . nitroGLYCERIN (NITROSTAT) 0.4 MG SL tablet Place 0.4 mg under the tongue every 5 (five) minutes as needed. For chest pains. May repeat for up to 3 doses.  Marland Kitchen omeprazole (PRILOSEC) 20 MG capsule Take 40 mg by mouth daily.  . Probiotic Product (ALIGN PO) Take 1 capsule by mouth daily.  . ranitidine (ZANTAC) 150 MG tablet Take 2 at bedtime  . simvastatin (ZOCOR) 10 MG tablet Take 10 mg by mouth at bedtime.   . Tamsulosin HCl (FLOMAX) 0.4 MG CAPS Take 0.4 mg by mouth every evening.   . warfarin (COUMADIN) 2.5 MG tablet Take 2.5 mg by mouth daily. 2.5 mg every day except Sunday, and Wed

## 2014-05-20 NOTE — Assessment & Plan Note (Addendum)
-   max gerd rx 03/20/14 > improved 04/03/14 - Allergy profile 04/03/2014 > neg Eos> IgE 5.9 with Neg RAST - Sinus Ct 04/09/2014 > Slight mucosal thickening within the floor of the left maxillary sinus compatible with chronic sinusitis.  Back to baseline with aggressive rx of gerd so no further rx planned and in fact he can now taper off and see what if any symptoms flare and if so resume previous rx or consider gi eval.  Pulmonary f/u is prn     Each maintenance medication was reviewed in detail including most importantly the difference between maintenance and as needed and under what circumstances the prns are to be used.  Please see instructions for details which were reviewed in writing and the patient given a copy.

## 2014-05-22 ENCOUNTER — Ambulatory Visit (INDEPENDENT_AMBULATORY_CARE_PROVIDER_SITE_OTHER): Payer: Commercial Managed Care - HMO | Admitting: *Deleted

## 2014-05-22 DIAGNOSIS — Z5181 Encounter for therapeutic drug level monitoring: Secondary | ICD-10-CM

## 2014-05-22 DIAGNOSIS — I4891 Unspecified atrial fibrillation: Secondary | ICD-10-CM

## 2014-05-22 DIAGNOSIS — Z7901 Long term (current) use of anticoagulants: Secondary | ICD-10-CM

## 2014-05-22 LAB — POCT INR: INR: 2.3

## 2014-06-14 ENCOUNTER — Encounter: Payer: Self-pay | Admitting: Cardiology

## 2014-06-23 ENCOUNTER — Encounter: Payer: Self-pay | Admitting: Cardiology

## 2014-06-26 ENCOUNTER — Encounter: Payer: Self-pay | Admitting: Cardiology

## 2014-06-26 ENCOUNTER — Ambulatory Visit (INDEPENDENT_AMBULATORY_CARE_PROVIDER_SITE_OTHER): Payer: Commercial Managed Care - HMO | Admitting: Cardiology

## 2014-06-26 VITALS — BP 121/80 | HR 80 | Ht 68.0 in | Wt 171.0 lb

## 2014-06-26 DIAGNOSIS — I4891 Unspecified atrial fibrillation: Secondary | ICD-10-CM

## 2014-06-26 DIAGNOSIS — Z7901 Long term (current) use of anticoagulants: Secondary | ICD-10-CM | POA: Diagnosis not present

## 2014-06-26 DIAGNOSIS — I2510 Atherosclerotic heart disease of native coronary artery without angina pectoris: Secondary | ICD-10-CM

## 2014-06-26 DIAGNOSIS — I482 Chronic atrial fibrillation, unspecified: Secondary | ICD-10-CM

## 2014-06-26 MED ORDER — CARVEDILOL 12.5 MG PO TABS: 12.5000 mg | ORAL_TABLET | Freq: Every morning | ORAL | Status: DC

## 2014-06-26 MED ORDER — CARVEDILOL 25 MG PO TABS: 25.0000 mg | ORAL_TABLET | Freq: Every evening | ORAL | Status: DC

## 2014-06-26 NOTE — Progress Notes (Signed)
Patient ID: [name], male   DOB: 1931-04-24, 80 y.o.   MRN: [id number]    HPI  Patient is seen today to followup coronary disease and atrial fibrillation. Earlier this year he had study showing that his cardiac status was stable. He had some chest heaviness and was admitted to [facility]. His basic workup was negative. His carvedilol dose was increased from 12.5 twice a day to 25 twice a day. He felt poorly with this. His primary care physician adjusted this to 12.5 mg in the morning and 25 mg at night. He is tolerating this at this time.  He has some hip discomfort when walking. His wife thinks that he has exertional shortness of breath. He is not complaining of this.  No Known Allergies  Current Outpatient Prescriptions  Medication Sig Dispense Refill  . carvedilol (COREG) 12.5 MG tablet Take 1 tablet (12.5 mg total) by mouth every morning.      . diltiazem (CARDIZEM CD) 180 MG 24 hr capsule Take 180 mg by mouth daily.        Marland Kitchen levothyroxine (SYNTHROID, LEVOTHROID) 75 MCG tablet Take 75 mcg by mouth daily before breakfast.      . magnesium gluconate (MAGONATE) 500 MG tablet Take 500 mg by mouth daily.       . Melatonin 1 MG CAPS Take 3 mg by mouth at bedtime as needed.       . nitroGLYCERIN (NITROSTAT) 0.4 MG SL tablet Place 0.4 mg under the tongue every 5 (five) minutes as needed. For chest pains. May repeat for up to 3 doses.      Marland Kitchen omeprazole (PRILOSEC) 20 MG capsule Take 40 mg by mouth daily.      . Probiotic Product (ALIGN PO) Take 1 capsule by mouth daily.      . ranitidine (ZANTAC) 150 MG tablet Take 2 at bedtime      . simvastatin (ZOCOR) 10 MG tablet Take 10 mg by mouth at bedtime.       . Tamsulosin HCl (FLOMAX) 0.4 MG CAPS Take 0.4 mg by mouth every evening.       . warfarin (COUMADIN) 2.5 MG tablet Take 2.5 mg by mouth daily. 2.5 mg every day except Sunday, and Wed      . carvedilol (COREG) 25 MG tablet Take 1 tablet (25 mg total) by mouth every evening.       No  current facility-administered medications for this visit.    History   Social History  . Marital Status: Married    Spouse Name: N/A    Number of Children: N/A  . Years of Education: N/A   Occupational History  . Retired-[employer]     Social History Main Topics  . Smoking status: Never Smoker   . Smokeless tobacco: Never Used  . Alcohol Use: No  . Drug Use: No  . Sexual Activity: Not on file   Other Topics Concern  . Not on file   Social History Narrative   Married    Family History  Problem Relation Age of Onset  . Colon cancer Mother   . Heart disease Father   . Stroke Brother   . Healthy Daughter     Past Medical History  Diagnosis Date  . Other malaise and fatigue   . CAD (coronary artery disease)     Catheterization 2004, mild/moderate nonobstructive disease  /   nuclear, 2007, small inferior scar // no ischemia  . Atrial fibrillation  Permanent  . GERD (gastroesophageal reflux disease)   . Clot     LA, small, in past  . Ejection fraction 02/2009    EF 55-60%,Echo, May, 2010  . LVH (left ventricular hypertrophy) 02/2009     Mild, echo, 2010  . Mitral regurgitation     Mild, echo, flat closure  . Elevated PSA   . Nausea vomiting and diarrhea 01/2010    Hospitalization, probably viral (or partial small bowel obstruction)/ Hospital 09/2010, recurrent possible partial small bowel obstruction, medical therapy  . Dizziness 03/2010    June, 2011   Mild, stop digoxin, felt better  . Carotid bruit     Doppler, December 23, 2010, no significant carotid disease  . Warfarin anticoagulation     Atrial fibrillation    Past Surgical History  Procedure Laterality Date  . Cardiac catheterization  2004  . Colonoscopy  2008    [facility]  . Cholecystectomy  2010    Dr. Anthony Sar  . Agile capsule  10/18/2011    Procedure: AGILE CAPSULE;  Surgeon: Rogene Houston, MD;  Location: [facility];  Service: Endoscopy;  Laterality: N/A;  730  .  Esophagogastroduodenoscopy  11/08/2011    Procedure: ESOPHAGOGASTRODUODENOSCOPY (EGD);  Surgeon: Rogene Houston, MD;  Location: [facility];  Service: Endoscopy;  Laterality: N/A;  300    Patient Active Problem List   Diagnosis Date Noted  . Warfarin anticoagulation     Priority: High  . Ejection fraction 02/13/2009    Priority: High  . Upper airway cough syndrome 03/22/2014  . Restrictive lung disease 03/22/2014  . Encounter for therapeutic drug monitoring 12/26/2013  . Hx SBO 09/18/2011  . Carotid bruit   . Other malaise and fatigue   . CAD (coronary artery disease)   . Atrial fibrillation   . GERD (gastroesophageal reflux disease)   . Clot   . Mitral regurgitation   . Dizziness 03/16/2010  . Nausea vomiting and diarrhea 01/14/2010    ROS   Patient denies fever, chills, headache, sweats, rash, change in vision, change in hearing, chest pain, cough, nausea vomiting, urinary symptoms. All other systems are reviewed and are negative.  PHYSICAL EXAM  Patient is oriented to person time and place. Affect is normal. There is no jugulovenous distention. Head is atraumatic. Sclera and conjunctiva are normal. Lungs are clear. Respiratory effort is nonlabored. Cardiac exam reveals S1 and S2. The rhythm is irregularly irregular. The rate is controlled. There is no peripheral edema.  Filed Vitals:   06/26/14 1144  BP: 121/80  Pulse: 80  Height: 5' 8"  (1.727 m)  Weight: 171 lb (77.565 kg)  SpO2: 98%     ASSESSMENT & PLAN

## 2014-06-26 NOTE — Assessment & Plan Note (Signed)
He continues on his anticoagulation.

## 2014-06-26 NOTE — Assessment & Plan Note (Signed)
He has permanent atrial fibrillation. He is anticoagulated. I think his rate is under reasonable control. The slight increased dose of carvedilol and he is now taking may help with any further heart rate issues. No change in therapy.

## 2014-06-26 NOTE — Assessment & Plan Note (Addendum)
I saw him in February, 2015 we decided to proceed with a nuclear study. This was done in March. It showed no significant ischemia. He did have some chest discomfort bringing him to the hospital recently. Troponins were normal. I am not inclined to push for further workup at this time. Currently he has some fatigue and some hip discomfort. At this point I do not think he is having any significant cardiac symptoms. I will see him back for early followup to reassess.

## 2014-06-26 NOTE — Patient Instructions (Signed)
Continue all current medications. BP goal should be not above 150 on the top number (systolic) & not over 85 on the bottom number (diastolic).   Follow up in  5-6 weeks

## 2014-07-03 ENCOUNTER — Ambulatory Visit (INDEPENDENT_AMBULATORY_CARE_PROVIDER_SITE_OTHER): Payer: Commercial Managed Care - HMO | Admitting: *Deleted

## 2014-07-03 DIAGNOSIS — I4891 Unspecified atrial fibrillation: Secondary | ICD-10-CM

## 2014-07-03 DIAGNOSIS — Z7901 Long term (current) use of anticoagulants: Secondary | ICD-10-CM

## 2014-07-03 DIAGNOSIS — Z5181 Encounter for therapeutic drug level monitoring: Secondary | ICD-10-CM

## 2014-07-03 LAB — POCT INR: INR: 2.1

## 2014-07-27 ENCOUNTER — Encounter: Payer: Self-pay | Admitting: Cardiology

## 2014-07-27 ENCOUNTER — Ambulatory Visit (INDEPENDENT_AMBULATORY_CARE_PROVIDER_SITE_OTHER): Payer: Commercial Managed Care - HMO | Admitting: Cardiology

## 2014-07-27 VITALS — BP 117/76 | HR 76 | Ht 68.0 in | Wt 171.5 lb

## 2014-07-27 DIAGNOSIS — M25559 Pain in unspecified hip: Secondary | ICD-10-CM

## 2014-07-27 DIAGNOSIS — I482 Chronic atrial fibrillation, unspecified: Secondary | ICD-10-CM

## 2014-07-27 DIAGNOSIS — I2510 Atherosclerotic heart disease of native coronary artery without angina pectoris: Secondary | ICD-10-CM

## 2014-07-27 NOTE — Assessment & Plan Note (Signed)
He has chronic atrial fibrillation. The rate is controlled. We have adjusted his carvedilol dosing and he is stable. No further workup.

## 2014-07-27 NOTE — Progress Notes (Signed)
Patient ID: [name], male   DOB: [birth year], 80 y.o.   MRN: [id number]    HPI  Patient is seen today for followup coronary disease and atrial fibrillation. I saw him last June 26, 2014. He had some chest heaviness and was admitted to the hospital. When I saw him back he was a post hospital visit. I felt that he was stable. We did not proceed with any further aggressive workup. He has not had any recurring significant chest discomfort.  No Known Allergies  Current Outpatient Prescriptions  Medication Sig Dispense Refill  . carvedilol (COREG) 12.5 MG tablet Take 1 tablet (12.5 mg total) by mouth every morning.      . carvedilol (COREG) 25 MG tablet Take 1 tablet (25 mg total) by mouth every evening.      . diltiazem (CARDIZEM CD) 180 MG 24 hr capsule Take 180 mg by mouth daily.        Marland Kitchen levothyroxine (SYNTHROID, LEVOTHROID) 75 MCG tablet Take 75 mcg by mouth daily before breakfast.      . magnesium gluconate (MAGONATE) 500 MG tablet Take 500 mg by mouth daily.       . Melatonin 1 MG CAPS Take 3 mg by mouth at bedtime as needed.       . nitroGLYCERIN (NITROSTAT) 0.4 MG SL tablet Place 0.4 mg under the tongue every 5 (five) minutes as needed. For chest pains. May repeat for up to 3 doses.      Marland Kitchen omeprazole (PRILOSEC) 20 MG capsule Take 40 mg by mouth daily.      . Probiotic Product (ALIGN PO) Take 1 capsule by mouth daily.      . ranitidine (ZANTAC) 150 MG tablet Take 1 at bedtime      . simvastatin (ZOCOR) 10 MG tablet Take 10 mg by mouth at bedtime.       . Tamsulosin HCl (FLOMAX) 0.4 MG CAPS Take 0.4 mg by mouth every evening.       . warfarin (COUMADIN) 2.5 MG tablet Take 2.5 mg by mouth daily. Take 1 tablet daily except 1 1/2 tablets on Wednesdays and Saturdays       No current facility-administered medications for this visit.    History   Social History  . Marital Status: Married    Spouse Name: N/A    Number of Children: N/A  . Years of Education: N/A   Occupational  History  . Retired-Hospital Mudlogger     Social History Main Topics  . Smoking status: Never Smoker   . Smokeless tobacco: Never Used  . Alcohol Use: No  . Drug Use: No  . Sexual Activity: Not on file   Other Topics Concern  . Not on file   Social History Narrative   Married    Family History  Problem Relation Age of Onset  . Colon cancer Mother   . Heart disease Father   . Stroke Brother   . Healthy Daughter     Past Medical History  Diagnosis Date  . Other malaise and fatigue   . CAD (coronary artery disease)     Catheterization 2004, mild/moderate nonobstructive disease  /   nuclear, 2007, small inferior scar // no ischemia  . Atrial fibrillation     Permanent  . GERD (gastroesophageal reflux disease)   . Clot     LA, small, in past  . Ejection fraction 02/2009    EF 55-60%,Echo, May, 2010  . LVH (left ventricular hypertrophy) 02/2009  Mild, echo, 2010  . Mitral regurgitation     Mild, echo, flat closure  . Elevated PSA   . Nausea vomiting and diarrhea 01/2010    Hospitalization, probably viral (or partial small bowel obstruction)/ Hospital 09/2010, recurrent possible partial small bowel obstruction, medical therapy  . Dizziness 03/2010    June, 2011   Mild, stop digoxin, felt better  . Carotid bruit     Doppler, December 23, 2010, no significant carotid disease  . Warfarin anticoagulation     Atrial fibrillation    Past Surgical History  Procedure Laterality Date  . Cardiac catheterization  2004  . Colonoscopy  2008    [facility]  . Cholecystectomy  2010    Dr. Anthony Sar  . Agile capsule  10/18/2011    Procedure: AGILE CAPSULE;  Surgeon: Rogene Houston, MD;  [facility];  Service: Endoscopy;  Laterality: N/A;  730  . Esophagogastroduodenoscopy  11/08/2011    Procedure: ESOPHAGOGASTRODUODENOSCOPY (EGD);  Surgeon: Rogene Houston, MD;  Location: [facility];  Service: Endoscopy;  Laterality: N/A;  300    Patient Active Problem List   Diagnosis  Date Noted  . Warfarin anticoagulation     Priority: High  . Ejection fraction 02/13/2009    Priority: High  . Upper airway cough syndrome 03/22/2014  . Restrictive lung disease 03/22/2014  . Encounter for therapeutic drug monitoring 12/26/2013  . Hx SBO 09/18/2011  . Carotid bruit   . Other malaise and fatigue   . CAD (coronary artery disease)   . Atrial fibrillation   . GERD (gastroesophageal reflux disease)   . Clot   . Mitral regurgitation   . Dizziness 03/16/2010  . Nausea vomiting and diarrhea 01/14/2010    ROS   Patient denies fever, chills, headache, sweats, rash, change in vision, change in hearing, chest pain, cough, nausea vomiting, urinary symptoms. All other systems are reviewed and are negative.  PHYSICAL EXAM  Patient is oriented to person time and place. Affect is normal. He is here with his wife as always. Head is atraumatic. Sclera and conjunctiva are normal. Lungs are clear. Respiratory effort is nonlabored. Cardiac exam reveals S1 and S2. The rate is controlled. The rhythm is irregularly irregular. The abdomen is soft. There is no peripheral edema.  Filed Vitals:   07/27/14 1016  BP: 117/76  Pulse: 76  Height: 5' 8"  (1.727 m)  Weight: 171 lb 8 oz (77.792 kg)  SpO2: 100%     ASSESSMENT & PLAN

## 2014-07-27 NOTE — Assessment & Plan Note (Signed)
Coronary disease is stable. No further workup at this time.

## 2014-07-27 NOTE — Patient Instructions (Signed)
Your physician recommends that you schedule a follow-up appointment in: 3 months. You will receive a reminder letter in the mail in about 1-2 months reminding you to call and schedule your appointment. If you don't receive this letter, please contact our office. Your physician recommends that you continue on your current medications as directed. Please refer to the Current Medication list given to you today.

## 2014-07-27 NOTE — Assessment & Plan Note (Signed)
Unfortunately his ambulation is limited by hip pain.

## 2014-08-13 ENCOUNTER — Ambulatory Visit (INDEPENDENT_AMBULATORY_CARE_PROVIDER_SITE_OTHER): Payer: Commercial Managed Care - HMO | Admitting: *Deleted

## 2014-08-13 DIAGNOSIS — Z5181 Encounter for therapeutic drug level monitoring: Secondary | ICD-10-CM

## 2014-08-13 DIAGNOSIS — Z7901 Long term (current) use of anticoagulants: Secondary | ICD-10-CM

## 2014-08-13 DIAGNOSIS — I4891 Unspecified atrial fibrillation: Secondary | ICD-10-CM

## 2014-08-13 LAB — POCT INR: INR: 2.8

## 2014-09-24 ENCOUNTER — Ambulatory Visit (INDEPENDENT_AMBULATORY_CARE_PROVIDER_SITE_OTHER): Payer: Commercial Managed Care - HMO | Admitting: *Deleted

## 2014-09-24 DIAGNOSIS — I4891 Unspecified atrial fibrillation: Secondary | ICD-10-CM

## 2014-09-24 DIAGNOSIS — Z7901 Long term (current) use of anticoagulants: Secondary | ICD-10-CM

## 2014-09-24 DIAGNOSIS — Z5181 Encounter for therapeutic drug level monitoring: Secondary | ICD-10-CM

## 2014-09-24 LAB — POCT INR: INR: 2.2

## 2014-11-05 ENCOUNTER — Ambulatory Visit (INDEPENDENT_AMBULATORY_CARE_PROVIDER_SITE_OTHER): Payer: Commercial Managed Care - HMO | Admitting: *Deleted

## 2014-11-05 DIAGNOSIS — I4891 Unspecified atrial fibrillation: Secondary | ICD-10-CM

## 2014-11-05 DIAGNOSIS — I480 Paroxysmal atrial fibrillation: Secondary | ICD-10-CM

## 2014-11-05 DIAGNOSIS — Z5181 Encounter for therapeutic drug level monitoring: Secondary | ICD-10-CM

## 2014-11-05 DIAGNOSIS — Z7901 Long term (current) use of anticoagulants: Secondary | ICD-10-CM

## 2014-11-05 LAB — POCT INR: INR: 2.2

## 2014-11-13 ENCOUNTER — Encounter: Payer: Self-pay | Admitting: Cardiology

## 2014-11-13 ENCOUNTER — Ambulatory Visit (INDEPENDENT_AMBULATORY_CARE_PROVIDER_SITE_OTHER): Payer: Commercial Managed Care - HMO | Admitting: Cardiology

## 2014-11-13 VITALS — BP 107/67 | HR 60 | Ht 68.0 in | Wt 170.4 lb

## 2014-11-13 DIAGNOSIS — I482 Chronic atrial fibrillation, unspecified: Secondary | ICD-10-CM

## 2014-11-13 DIAGNOSIS — I2510 Atherosclerotic heart disease of native coronary artery without angina pectoris: Secondary | ICD-10-CM

## 2014-11-13 DIAGNOSIS — Z7901 Long term (current) use of anticoagulants: Secondary | ICD-10-CM

## 2014-11-13 NOTE — Patient Instructions (Signed)
Your physician wants you to follow-up in: 7 months with Dr. Kai Levins will receive a reminder letter in the mail two months in advance. If you don't receive a letter, please call our office to schedule the follow-up appointment.  Your physician recommends that you continue on your current medications as directed. Please refer to the Current Medication list given to you today.  Thank you for choosing [facility]!!

## 2014-11-13 NOTE — Assessment & Plan Note (Signed)
Coronary disease is stable. No change in therapy.

## 2014-11-13 NOTE — Assessment & Plan Note (Signed)
He continues on anticoagulation for his atrial fib.

## 2014-11-13 NOTE — Progress Notes (Signed)
HPI Patient is seen to follow-up atrial fibrillation and coronary disease. He is doing well. I saw him last October, 2015. He is not having any recurrent chest pain. He is stable. He had some back discomfort recently related to some muscle spasm. He feels better now.  No Known Allergies  Current Outpatient Prescriptions  Medication Sig Dispense Refill  . carvedilol (COREG) 12.5 MG tablet Take 1 tablet (12.5 mg total) by mouth every morning.    . carvedilol (COREG) 25 MG tablet Take 1 tablet (25 mg total) by mouth every evening.    . diltiazem (CARDIZEM CD) 180 MG 24 hr capsule Take 180 mg by mouth daily.      Marland Kitchen levothyroxine (SYNTHROID, LEVOTHROID) 75 MCG tablet Take 75 mcg by mouth daily before breakfast.    . magnesium gluconate (MAGONATE) 500 MG tablet Take 500 mg by mouth daily.     . Melatonin 1 MG CAPS Take 3 mg by mouth at bedtime as needed.     . nitroGLYCERIN (NITROSTAT) 0.4 MG SL tablet Place 0.4 mg under the tongue every 5 (five) minutes as needed. For chest pains. May repeat for up to 3 doses.    Marland Kitchen omeprazole (PRILOSEC) 20 MG capsule Take 40 mg by mouth daily.    . Probiotic Product (ALIGN PO) Take 1 capsule by mouth daily.    . ranitidine (ZANTAC) 150 MG tablet Take 1 at bedtime    . simvastatin (ZOCOR) 10 MG tablet Take 10 mg by mouth at bedtime.     . Tamsulosin HCl (FLOMAX) 0.4 MG CAPS Take 0.4 mg by mouth every evening.     . warfarin (COUMADIN) 2.5 MG tablet Take 2.5 mg by mouth daily. Take 1 tablet daily except 1 1/2 tablets on Wednesdays and Saturdays     No current facility-administered medications for this visit.    History   Social History  . Marital Status: Married    Spouse Name: N/A    Number of Children: N/A  . Years of Education: N/A   Occupational History  . Retired-Hospital Mudlogger     Social History Main Topics  . Smoking status: Never Smoker   . Smokeless tobacco: Never Used  . Alcohol Use: No  . Drug Use: No  . Sexual Activity: Not on  file   Other Topics Concern  . Not on file   Social History Narrative   Married    Family History  Problem Relation Age of Onset  . Colon cancer Mother   . Heart disease Father   . Stroke Brother   . Healthy Daughter     Past Medical History  Diagnosis Date  . Other malaise and fatigue   . CAD (coronary artery disease)     Catheterization 2004, mild/moderate nonobstructive disease  /   nuclear, 2007, small inferior scar // no ischemia  . Atrial fibrillation     Permanent  . GERD (gastroesophageal reflux disease)   . Clot     LA, small, in past  . Ejection fraction 02/2009    EF 55-60%,Echo, May, 2010  . LVH (left ventricular hypertrophy) 02/2009     Mild, echo, 2010  . Mitral regurgitation     Mild, echo, flat closure  . Elevated PSA   . Nausea vomiting and diarrhea 01/2010    Hospitalization, probably viral (or partial small bowel obstruction)/ Hospital 09/2010, recurrent possible partial small bowel obstruction, medical therapy  . Dizziness 03/2010    June, 2011  Mild, stop digoxin, felt better  . Carotid bruit     Doppler, December 23, 2010, no significant carotid disease  . Warfarin anticoagulation     Atrial fibrillation    Past Surgical History  Procedure Laterality Date  . Cardiac catheterization  2004  . Colonoscopy  2008    [facility]  . Cholecystectomy  2010    Dr. Anthony Sar  . Agile capsule  10/18/2011    Procedure: AGILE CAPSULE;  Surgeon: Rogene Houston, MD;  [facility]: [facility];  Service: Endoscopy;  Laterality: N/A;  730  . Esophagogastroduodenoscopy  11/08/2011    Procedure: ESOPHAGOGASTRODUODENOSCOPY (EGD);  Surgeon: Rogene Houston, MD;  [facility];  Service: Endoscopy;  Laterality: N/A;  300    Patient Active Problem List   Diagnosis Date Noted  . Warfarin anticoagulation     Priority: High  . Ejection fraction 02/13/2009    Priority: High  . Hip pain 07/27/2014  . Upper airway cough syndrome 03/22/2014  . Restrictive lung  disease 03/22/2014  . Encounter for therapeutic drug monitoring 12/26/2013  . Hx SBO 09/18/2011  . Carotid bruit   . Other malaise and fatigue   . CAD (coronary artery disease)   . Atrial fibrillation   . GERD (gastroesophageal reflux disease)   . Clot   . Mitral regurgitation   . Dizziness 03/16/2010  . Nausea vomiting and diarrhea 01/14/2010    ROS  Patient denies fever, chills, headache, sweats, rash, change in vision, change in hearing, chest pain, cough, nausea or vomiting, urinary symptoms. All other systems are reviewed and are negative.  PHYSICAL EXAM Patient's here with his wife. He is oriented to person time and place. Affect is normal. Head is atraumatic. Sclera and conjunctiva are normal. There is no jugulovenous distention. Lungs are clear. Respiratory effort is nonlabored. Cardiac exam reveals S1 and S2. Abdomen is soft. There is no peripheral edema.  Filed Vitals:   11/13/14 1312  BP: 107/67  Pulse: 60  Height: 5' 8"  (1.727 m)  Weight: 170 lb 6.4 oz (77.293 kg)  SpO2: 98%   EKG is not done today.  ASSESSMENT & PLAN

## 2014-11-13 NOTE — Assessment & Plan Note (Signed)
Atrial fibrillation rate is controlled. No change in therapy. 

## 2014-12-17 ENCOUNTER — Ambulatory Visit (INDEPENDENT_AMBULATORY_CARE_PROVIDER_SITE_OTHER): Payer: Commercial Managed Care - HMO | Admitting: *Deleted

## 2014-12-17 DIAGNOSIS — I480 Paroxysmal atrial fibrillation: Secondary | ICD-10-CM

## 2014-12-17 DIAGNOSIS — Z7901 Long term (current) use of anticoagulants: Secondary | ICD-10-CM

## 2014-12-17 DIAGNOSIS — I4891 Unspecified atrial fibrillation: Secondary | ICD-10-CM

## 2014-12-17 DIAGNOSIS — Z5181 Encounter for therapeutic drug level monitoring: Secondary | ICD-10-CM

## 2014-12-17 LAB — POCT INR: INR: 2.5

## 2015-01-28 ENCOUNTER — Ambulatory Visit (INDEPENDENT_AMBULATORY_CARE_PROVIDER_SITE_OTHER): Payer: Commercial Managed Care - HMO | Admitting: *Deleted

## 2015-01-28 DIAGNOSIS — I4891 Unspecified atrial fibrillation: Secondary | ICD-10-CM

## 2015-01-28 DIAGNOSIS — Z5181 Encounter for therapeutic drug level monitoring: Secondary | ICD-10-CM | POA: Diagnosis not present

## 2015-01-28 DIAGNOSIS — Z7901 Long term (current) use of anticoagulants: Secondary | ICD-10-CM

## 2015-01-28 DIAGNOSIS — I480 Paroxysmal atrial fibrillation: Secondary | ICD-10-CM

## 2015-01-28 LAB — POCT INR: INR: 3.7

## 2015-02-02 ENCOUNTER — Telehealth: Payer: Self-pay | Admitting: *Deleted

## 2015-02-02 NOTE — Telephone Encounter (Signed)
Called to report that Dr Quintin Alto has taken him off Naproxen.  He was also in [facility] x 2 days for stomach virus.  INR was 2.5 today.  Pt was d/c home.  He will continue current coumadin dose.

## 2015-02-25 ENCOUNTER — Ambulatory Visit (INDEPENDENT_AMBULATORY_CARE_PROVIDER_SITE_OTHER): Payer: Commercial Managed Care - HMO | Admitting: *Deleted

## 2015-02-25 DIAGNOSIS — I4891 Unspecified atrial fibrillation: Secondary | ICD-10-CM | POA: Diagnosis not present

## 2015-02-25 DIAGNOSIS — Z5181 Encounter for therapeutic drug level monitoring: Secondary | ICD-10-CM

## 2015-02-25 DIAGNOSIS — Z7901 Long term (current) use of anticoagulants: Secondary | ICD-10-CM

## 2015-02-25 LAB — POCT INR: INR: 2.9

## 2015-03-25 ENCOUNTER — Ambulatory Visit (INDEPENDENT_AMBULATORY_CARE_PROVIDER_SITE_OTHER): Payer: Commercial Managed Care - HMO | Admitting: *Deleted

## 2015-03-25 DIAGNOSIS — Z7901 Long term (current) use of anticoagulants: Secondary | ICD-10-CM

## 2015-03-25 DIAGNOSIS — Z5181 Encounter for therapeutic drug level monitoring: Secondary | ICD-10-CM | POA: Diagnosis not present

## 2015-03-25 DIAGNOSIS — I4891 Unspecified atrial fibrillation: Secondary | ICD-10-CM

## 2015-03-25 LAB — POCT INR: INR: 2.8

## 2015-04-12 ENCOUNTER — Other Ambulatory Visit: Payer: Self-pay

## 2015-04-14 ENCOUNTER — Encounter: Payer: Self-pay | Admitting: Cardiology

## 2015-04-22 ENCOUNTER — Ambulatory Visit (INDEPENDENT_AMBULATORY_CARE_PROVIDER_SITE_OTHER): Payer: Commercial Managed Care - HMO | Admitting: *Deleted

## 2015-04-22 DIAGNOSIS — Z5181 Encounter for therapeutic drug level monitoring: Secondary | ICD-10-CM | POA: Diagnosis not present

## 2015-04-22 DIAGNOSIS — Z7901 Long term (current) use of anticoagulants: Secondary | ICD-10-CM | POA: Diagnosis not present

## 2015-04-22 DIAGNOSIS — I4891 Unspecified atrial fibrillation: Secondary | ICD-10-CM | POA: Diagnosis not present

## 2015-04-22 LAB — POCT INR: INR: 2.7

## 2015-05-06 ENCOUNTER — Encounter: Payer: Self-pay | Admitting: Cardiology

## 2015-05-06 ENCOUNTER — Ambulatory Visit (INDEPENDENT_AMBULATORY_CARE_PROVIDER_SITE_OTHER): Payer: Commercial Managed Care - HMO | Admitting: Cardiology

## 2015-05-06 VITALS — BP 102/68 | HR 77 | Ht 68.0 in | Wt 167.0 lb

## 2015-05-06 DIAGNOSIS — R9430 Abnormal result of cardiovascular function study, unspecified: Secondary | ICD-10-CM

## 2015-05-06 DIAGNOSIS — I482 Chronic atrial fibrillation, unspecified: Secondary | ICD-10-CM

## 2015-05-06 DIAGNOSIS — I340 Nonrheumatic mitral (valve) insufficiency: Secondary | ICD-10-CM

## 2015-05-06 DIAGNOSIS — R5383 Other fatigue: Secondary | ICD-10-CM

## 2015-05-06 DIAGNOSIS — R0989 Other specified symptoms and signs involving the circulatory and respiratory systems: Secondary | ICD-10-CM | POA: Diagnosis not present

## 2015-05-06 DIAGNOSIS — R0602 Shortness of breath: Secondary | ICD-10-CM | POA: Insufficient documentation

## 2015-05-06 DIAGNOSIS — I2510 Atherosclerotic heart disease of native coronary artery without angina pectoris: Secondary | ICD-10-CM

## 2015-05-06 DIAGNOSIS — Z7901 Long term (current) use of anticoagulants: Secondary | ICD-10-CM

## 2015-05-06 MED ORDER — CARVEDILOL 12.5 MG PO TABS: 12.5000 mg | ORAL_TABLET | Freq: Two times a day (BID) | ORAL | Status: DC

## 2015-05-06 NOTE — Assessment & Plan Note (Signed)
He has some episodes of fatigue. I am not convinced that this is a significant cardiac problem. I have decided to lower his carvedilol dose slightly to see how he feels.

## 2015-05-06 NOTE — Assessment & Plan Note (Signed)
Coumadin is continued for his anticoagulation. No change in therapy.

## 2015-05-06 NOTE — Assessment & Plan Note (Signed)
Atrial fibrillation is chronic. The rate is controlled. No change in therapy.

## 2015-05-06 NOTE — Addendum Note (Signed)
Addended by: Julian Hy T on: 05/06/2015 03:34 PM   Modules accepted: Orders

## 2015-05-06 NOTE — Progress Notes (Signed)
Cardiology Office Note   Date:  05/06/2015   ID:  [name], DOB Jan 25, 1931, MRN [id number]  PCP:  Manon Hilding, MD  Cardiologist:  Dola Argyle, MD   Chief Complaint  Patient presents with  . Appointment    follow-up atrial fibrillation and coronary artery disease      History of Present Illness: [name] is a 81 y.o. male who presents today to follow-up atrial fibrillation and coronary disease. He is stable. He is not having any chest pain. He has some shortness of breath but it appears to bother him most when he leans over. His overall exercise capacity has decreased some. He also has some periods when he has a sensation of weakness. There is no syncope or presyncope.  The patient is aware that I will retire at the end of September, 2016. He and his wife have done their research. They're friends have recommended follow-up with Dr. Raliegh Ip. We will arrange this.  Past Medical History  Diagnosis Date  . Other malaise and fatigue   . CAD (coronary artery disease)     Catheterization 2004, mild/moderate nonobstructive disease  /   nuclear, 2007, small inferior scar // no ischemia  . Atrial fibrillation     Permanent  . GERD (gastroesophageal reflux disease)   . Clot     LA, small, in past  . Ejection fraction 02/2009    EF 55-60%,Echo, May, 2010  . LVH (left ventricular hypertrophy) 02/2009     Mild, echo, 2010  . Mitral regurgitation     Mild, echo, flat closure  . Elevated PSA   . Nausea vomiting and diarrhea 01/2010    Hospitalization, probably viral (or partial small bowel obstruction)/ Hospital 09/2010, recurrent possible partial small bowel obstruction, medical therapy  . Dizziness 03/2010    June, 2011   Mild, stop digoxin, felt better  . Carotid bruit     Doppler, December 23, 2010, no significant carotid disease  . Warfarin anticoagulation     Atrial fibrillation    Past Surgical History  Procedure Laterality Date  . Cardiac catheterization  2004  .  Colonoscopy  2008    [facility]  . Cholecystectomy  2010    Dr. Anthony Sar  . Agile capsule  10/18/2011    Procedure: AGILE CAPSULE;  Surgeon: Rogene Houston, MD;  [facility];  Service: Endoscopy;  Laterality: N/A;  730  . Esophagogastroduodenoscopy  11/08/2011    Procedure: ESOPHAGOGASTRODUODENOSCOPY (EGD);  Surgeon: Rogene Houston, MD;  [facility];  Service: Endoscopy;  Laterality: N/A;  300    Patient Active Problem List   Diagnosis Date Noted  . Warfarin anticoagulation     Priority: High  . Ejection fraction 02/13/2009    Priority: High  . Hip pain 07/27/2014  . Upper airway cough syndrome 03/22/2014  . Restrictive lung disease 03/22/2014  . Encounter for therapeutic drug monitoring 12/26/2013  . Hx SBO 09/18/2011  . Carotid bruit   . Other malaise and fatigue   . CAD (coronary artery disease)   . Atrial fibrillation   . GERD (gastroesophageal reflux disease)   . Clot   . Mitral regurgitation   . Dizziness 03/16/2010  . Nausea vomiting and diarrhea 01/14/2010      Current Outpatient Prescriptions  Medication Sig Dispense Refill  . carvedilol (COREG) 12.5 MG tablet Take 1 tablet (12.5 mg total) by mouth every morning.    . carvedilol (COREG) 25 MG tablet Take 1  tablet (25 mg total) by mouth every evening.    . diltiazem (CARDIZEM CD) 180 MG 24 hr capsule Take 180 mg by mouth daily.      Marland Kitchen levothyroxine (SYNTHROID, LEVOTHROID) 75 MCG tablet Take 75 mcg by mouth daily before breakfast.    . magnesium gluconate (MAGONATE) 500 MG tablet Take 500 mg by mouth daily.     . Melatonin 1 MG CAPS Take 2 mg by mouth at bedtime as needed.     . nitroGLYCERIN (NITROSTAT) 0.4 MG SL tablet Place 0.4 mg under the tongue every 5 (five) minutes as needed. For chest pains. May repeat for up to 3 doses.    Marland Kitchen omeprazole (PRILOSEC) 20 MG capsule Take 40 mg by mouth daily.    . Probiotic Product (ALIGN PO) Take 1 capsule by mouth daily.    . ranitidine (ZANTAC) 150 MG  tablet Take 1 at bedtime    . simvastatin (ZOCOR) 10 MG tablet Take 10 mg by mouth at bedtime.     . Tamsulosin HCl (FLOMAX) 0.4 MG CAPS Take 0.4 mg by mouth every evening.     . warfarin (COUMADIN) 2.5 MG tablet Take 2.5 mg by mouth daily. Take 1 tablet daily except 1 1/2 tablets on Wednesdays and Saturdays     No current facility-administered medications for this visit.    Allergies:   Review of patient's allergies indicates no known allergies.    Social History:  The patient  reports that he has never smoked. He has never used smokeless tobacco. He reports that he does not drink alcohol or use illicit drugs.   Family History:  The patient's family history includes Colon cancer in his mother; Healthy in his daughter; Heart disease in his father; Stroke in his brother.    ROS:  Please see the history of present illness.   Patient denies fever, chills, headache, sweats, rash, change in vision, change in hearing, chest pain, cough, nausea or vomiting, urinary symptoms. All other systems are reviewed and are negative.     PHYSICAL EXAM: VS:  BP 102/68 mmHg  Pulse 77  Ht 5' 8"  (1.727 m)  Wt 167 lb (75.751 kg)  BMI 25.40 kg/m2  SpO2 97% , Patient is oriented to person time and place. Affect is normal. Head is atraumatic. Sclera and conjunctiva are normal. He is here with his wife as always. There is no jugular venous distention. Lungs are clear. Respiratory effort is nonlabored. Cardiac exam reveals S1 and S2. Abdomen is soft. There is no peripheral edema. There is no musculoskeletal deformities. There are no skin rashes.  EKG:   EKG is done today and reviewed by me. There is atrial fibrillation. The rate is controlled. One PVC is noted. There is decreased R-wave in lead V2. There is no change from the past.   Recent Labs: No results found for requested labs within last 365 days.    Lipid Panel No results found for: CHOL, TRIG, HDL, CHOLHDL, VLDL, LDLCALC, LDLDIRECT    Wt Readings  from Last 3 Encounters:  05/06/15 167 lb (75.751 kg)  11/13/14 170 lb 6.4 oz (77.293 kg)  07/27/14 171 lb 8 oz (77.792 kg)      Current medicines are reviewed  The patient and his wife understand his medications.     ASSESSMENT AND PLAN:

## 2015-05-06 NOTE — Assessment & Plan Note (Signed)
Catheterization in 2004 revealed mild to moderate nonobstructive disease. He had a nuclear stress study in March, 2015. There was no definite ischemia. No further workup.

## 2015-05-06 NOTE — Assessment & Plan Note (Signed)
Patient had mild mitral regurgitation in the past. No further workup.

## 2015-05-06 NOTE — Patient Instructions (Signed)
   Decrease Coreg to 12.82m twice a day  [facility] Continue all other medications.   Your physician wants you to follow up in: 6 months.  You will receive a reminder letter in the mail one-two months in advance.  If you don't receive a letter, please call our office to schedule the follow up appointment

## 2015-05-06 NOTE — Assessment & Plan Note (Signed)
Historically he has normal LV function. The last echo was June, 2014.

## 2015-05-06 NOTE — Assessment & Plan Note (Signed)
He shortness of breath is random. I'm not convinced that it is cardiac in origin. No further workup. He does have some restrictive lung disease by pulmonary function studies in the past.

## 2015-06-08 ENCOUNTER — Ambulatory Visit (INDEPENDENT_AMBULATORY_CARE_PROVIDER_SITE_OTHER): Payer: Commercial Managed Care - HMO | Admitting: *Deleted

## 2015-06-08 DIAGNOSIS — Z5181 Encounter for therapeutic drug level monitoring: Secondary | ICD-10-CM

## 2015-06-08 DIAGNOSIS — Z7901 Long term (current) use of anticoagulants: Secondary | ICD-10-CM

## 2015-06-08 DIAGNOSIS — I4891 Unspecified atrial fibrillation: Secondary | ICD-10-CM | POA: Diagnosis not present

## 2015-06-08 LAB — POCT INR: INR: 2.3

## 2015-07-22 ENCOUNTER — Ambulatory Visit (INDEPENDENT_AMBULATORY_CARE_PROVIDER_SITE_OTHER): Payer: Commercial Managed Care - HMO | Admitting: *Deleted

## 2015-07-22 DIAGNOSIS — Z7901 Long term (current) use of anticoagulants: Secondary | ICD-10-CM

## 2015-07-22 DIAGNOSIS — I4891 Unspecified atrial fibrillation: Secondary | ICD-10-CM

## 2015-07-22 DIAGNOSIS — Z5181 Encounter for therapeutic drug level monitoring: Secondary | ICD-10-CM

## 2015-07-22 LAB — POCT INR: INR: 2.4

## 2015-09-02 ENCOUNTER — Ambulatory Visit (INDEPENDENT_AMBULATORY_CARE_PROVIDER_SITE_OTHER): Payer: Commercial Managed Care - HMO | Admitting: *Deleted

## 2015-09-02 DIAGNOSIS — Z5181 Encounter for therapeutic drug level monitoring: Secondary | ICD-10-CM | POA: Diagnosis not present

## 2015-09-02 DIAGNOSIS — I4891 Unspecified atrial fibrillation: Secondary | ICD-10-CM | POA: Diagnosis not present

## 2015-09-02 DIAGNOSIS — Z7901 Long term (current) use of anticoagulants: Secondary | ICD-10-CM | POA: Diagnosis not present

## 2015-09-02 LAB — POCT INR: INR: 2.5

## 2015-10-21 ENCOUNTER — Ambulatory Visit (INDEPENDENT_AMBULATORY_CARE_PROVIDER_SITE_OTHER): Payer: PPO | Admitting: *Deleted

## 2015-10-21 DIAGNOSIS — Z5181 Encounter for therapeutic drug level monitoring: Secondary | ICD-10-CM | POA: Diagnosis not present

## 2015-10-21 DIAGNOSIS — I4891 Unspecified atrial fibrillation: Secondary | ICD-10-CM | POA: Diagnosis not present

## 2015-10-21 DIAGNOSIS — Z7901 Long term (current) use of anticoagulants: Secondary | ICD-10-CM | POA: Diagnosis not present

## 2015-10-21 LAB — POCT INR: INR: 2.7

## 2015-11-02 ENCOUNTER — Ambulatory Visit (INDEPENDENT_AMBULATORY_CARE_PROVIDER_SITE_OTHER): Payer: PPO | Admitting: Cardiovascular Disease

## 2015-11-02 ENCOUNTER — Encounter: Payer: Self-pay | Admitting: Cardiovascular Disease

## 2015-11-02 VITALS — BP 120/80 | HR 74 | Ht 68.0 in | Wt 170.0 lb

## 2015-11-02 DIAGNOSIS — R5383 Other fatigue: Secondary | ICD-10-CM | POA: Diagnosis not present

## 2015-11-02 DIAGNOSIS — I482 Chronic atrial fibrillation, unspecified: Secondary | ICD-10-CM

## 2015-11-02 DIAGNOSIS — I2510 Atherosclerotic heart disease of native coronary artery without angina pectoris: Secondary | ICD-10-CM

## 2015-11-02 MED ORDER — CARVEDILOL 6.25 MG PO TABS: 6.2500 mg | ORAL_TABLET | Freq: Two times a day (BID) | ORAL | Status: DC

## 2015-11-02 NOTE — Progress Notes (Signed)
Patient ID: [name], male   DOB: Jul 23, 1931, 82 y.o.   MRN: [id number]      SUBJECTIVE: The patient is an 82 year old male with a history of nonobstructive coronary artery disease and chronic atrial fibrillation. He is a former patient of Dr. Ron Parker.  He underwent a low risk nuclear stress test on 12/17/13 with no evidence of ischemia, LVEF 61%.  Echocardiogram on 04/10/13 demonstrated normal left ventricular systolic function, EF 73-41%, mild LVH, mild to moderate left atrial dilatation, and mild mitral regurgitation.   Dr. Ron Parker reduced his Coreg at his last visit and the patient has less fatigue than he used to, but has still felt more fatigued in the past year overall. He has not gotten any worse. He denies exertional chest pain. He used to jog and then started playing golf but due to hip problems does not do either.   He is here with his wife, Inez Catalina.  Review of Systems: As per "subjective", otherwise negative.  No Known Allergies  Current Outpatient Prescriptions  Medication Sig Dispense Refill  . carvedilol (COREG) 12.5 MG tablet Take 1 tablet (12.5 mg total) by mouth 2 (two) times daily. 60 tablet 6  . diltiazem (CARDIZEM CD) 180 MG 24 hr capsule Take 180 mg by mouth daily.      Marland Kitchen levothyroxine (SYNTHROID, LEVOTHROID) 75 MCG tablet Take 75 mcg by mouth daily before breakfast.    . magnesium gluconate (MAGONATE) 500 MG tablet Take 500 mg by mouth daily.     . Melatonin 1 MG CAPS Take 2 mg by mouth at bedtime as needed.     . nitroGLYCERIN (NITROSTAT) 0.4 MG SL tablet Place 0.4 mg under the tongue every 5 (five) minutes as needed. For chest pains. May repeat for up to 3 doses.    Marland Kitchen omeprazole (PRILOSEC) 20 MG capsule Take 40 mg by mouth daily.    . Probiotic Product (ALIGN PO) Take 1 capsule by mouth daily.    . ranitidine (ZANTAC) 150 MG tablet Take 1 at bedtime    . simvastatin (ZOCOR) 10 MG tablet Take 10 mg by mouth at bedtime.     . Tamsulosin HCl (FLOMAX) 0.4 MG CAPS Take 0.4  mg by mouth every evening.     . warfarin (COUMADIN) 2.5 MG tablet Take 2.5 mg by mouth daily. Take 1 tablet daily except 1 1/2 tablets on Wednesdays and Saturdays     No current facility-administered medications for this visit.    Past Medical History  Diagnosis Date  . Other malaise and fatigue   . CAD (coronary artery disease)     Catheterization 2004, mild/moderate nonobstructive disease  /   nuclear, 2007, small inferior scar // no ischemia  . Atrial fibrillation ([place])     Permanent  . GERD (gastroesophageal reflux disease)   . Clot     LA, small, in past  . Ejection fraction 02/2009    EF 55-60%,Echo, May, 2010  . LVH (left ventricular hypertrophy) 02/2009     Mild, echo, 2010  . Mitral regurgitation     Mild, echo, flat closure  . Elevated PSA   . Nausea vomiting and diarrhea 01/2010    Hospitalization, probably viral (or partial small bowel obstruction)/ Hospital 09/2010, recurrent possible partial small bowel obstruction, medical therapy  . Dizziness 03/2010    June, 2011   Mild, stop digoxin, felt better  . Carotid bruit     Doppler, December 23, 2010, no significant carotid disease  . Warfarin  anticoagulation     Atrial fibrillation    Past Surgical History  Procedure Laterality Date  . Cardiac catheterization  2004  . Colonoscopy  2008    [facility]  . Cholecystectomy  2010    Dr. Anthony Sar  . Agile capsule  10/18/2011    Procedure: AGILE CAPSULE;  Surgeon: Rogene Houston, MD;  [facility];  Service: Endoscopy;  Laterality: N/A;  730  . Esophagogastroduodenoscopy  11/08/2011    Procedure: ESOPHAGOGASTRODUODENOSCOPY (EGD);  Surgeon: Rogene Houston, MD;  Location: [facility];  Service: Endoscopy;  Laterality: N/A;  300    Social History   Social History  . Marital Status: Married    Spouse Name: N/A  . Number of Children: N/A  . Years of Education: N/A   Occupational History  . Retired-Hospital Mudlogger     Social History Main Topics  . Smoking  status: Never Smoker   . Smokeless tobacco: Never Used  . Alcohol Use: No  . Drug Use: No  . Sexual Activity: Not on file   Other Topics Concern  . Not on file   Social History Narrative   Married     Filed Vitals:   11/02/15 1410  BP: 120/80  Pulse: 74  Height: 5' 8"  (1.727 m)  Weight: 170 lb (77.111 kg)  SpO2: 96%    PHYSICAL EXAM General: NAD HEENT: Normal. Neck: No JVD, no thyromegaly. Lungs: Clear to auscultation bilaterally with normal respiratory effort. CV: Nondisplaced PMI.  Regular rate and irregular rhythm, normal S1/S2, no S3, no murmur. No pretibial or periankle edema.  No carotid bruit.  Abdomen: Nontender, firm, no distention.  Neurologic: Alert and oriented x 3.  Psych: Normal affect. Skin: Normal. Musculoskeletal: No gross deformities. Extremities: No clubbing or cyanosis.   ECG: Most recent ECG reviewed.      ASSESSMENT AND PLAN: 1. Chronic atrial fibrillation:  Symptomatically stable. Continue diltiazem and warfarin.  2. CAD: Nonobstructive. Normal nuclear stress test as noted above. No changes.  3. Fatigue: Will decrease Coreg to 6.25 mg bid to see if this improves symptoms. If HR becomes elevated, can always increase diltiazem dose.  Dispo: f/u 6 months.   Kate Sable, M.D., F.A.C.C.

## 2015-11-02 NOTE — Patient Instructions (Signed)
   Decrease Coreg to 6.28m twice a day  - may take 1/2 tab of the 12.534mtablet till finish current supply.  New 6.2580mablet sent to [facility]. Continue all other medications.   Your physician wants you to follow up in: 6 months.  You will receive a reminder letter in the mail one-two months in advance.  If you don't receive a letter, please call our office to schedule the follow up appointment

## 2015-11-19 DIAGNOSIS — I2510 Atherosclerotic heart disease of native coronary artery without angina pectoris: Secondary | ICD-10-CM | POA: Diagnosis not present

## 2015-11-19 DIAGNOSIS — R079 Chest pain, unspecified: Secondary | ICD-10-CM | POA: Diagnosis not present

## 2015-11-19 DIAGNOSIS — M549 Dorsalgia, unspecified: Secondary | ICD-10-CM | POA: Diagnosis not present

## 2015-11-19 DIAGNOSIS — E785 Hyperlipidemia, unspecified: Secondary | ICD-10-CM | POA: Diagnosis not present

## 2015-11-19 DIAGNOSIS — Z7901 Long term (current) use of anticoagulants: Secondary | ICD-10-CM | POA: Diagnosis not present

## 2015-11-19 DIAGNOSIS — N400 Enlarged prostate without lower urinary tract symptoms: Secondary | ICD-10-CM | POA: Diagnosis not present

## 2015-11-19 DIAGNOSIS — I249 Acute ischemic heart disease, unspecified: Secondary | ICD-10-CM | POA: Diagnosis not present

## 2015-11-19 DIAGNOSIS — I340 Nonrheumatic mitral (valve) insufficiency: Secondary | ICD-10-CM | POA: Diagnosis not present

## 2015-11-19 DIAGNOSIS — Z9049 Acquired absence of other specified parts of digestive tract: Secondary | ICD-10-CM | POA: Diagnosis not present

## 2015-11-19 DIAGNOSIS — E039 Hypothyroidism, unspecified: Secondary | ICD-10-CM | POA: Diagnosis not present

## 2015-11-19 DIAGNOSIS — M25512 Pain in left shoulder: Secondary | ICD-10-CM | POA: Diagnosis not present

## 2015-11-19 DIAGNOSIS — Z79899 Other long term (current) drug therapy: Secondary | ICD-10-CM | POA: Diagnosis not present

## 2015-11-19 DIAGNOSIS — I10 Essential (primary) hypertension: Secondary | ICD-10-CM | POA: Diagnosis not present

## 2015-11-19 DIAGNOSIS — K219 Gastro-esophageal reflux disease without esophagitis: Secondary | ICD-10-CM | POA: Diagnosis not present

## 2015-11-19 DIAGNOSIS — I4891 Unspecified atrial fibrillation: Secondary | ICD-10-CM | POA: Diagnosis not present

## 2015-11-20 DIAGNOSIS — R079 Chest pain, unspecified: Secondary | ICD-10-CM | POA: Diagnosis not present

## 2015-11-21 DIAGNOSIS — G459 Transient cerebral ischemic attack, unspecified: Secondary | ICD-10-CM | POA: Diagnosis not present

## 2015-11-21 DIAGNOSIS — Z79899 Other long term (current) drug therapy: Secondary | ICD-10-CM | POA: Diagnosis not present

## 2015-11-21 DIAGNOSIS — I4891 Unspecified atrial fibrillation: Secondary | ICD-10-CM | POA: Diagnosis not present

## 2015-11-21 DIAGNOSIS — R6883 Chills (without fever): Secondary | ICD-10-CM | POA: Diagnosis not present

## 2015-11-21 DIAGNOSIS — R109 Unspecified abdominal pain: Secondary | ICD-10-CM | POA: Diagnosis not present

## 2015-11-21 DIAGNOSIS — R112 Nausea with vomiting, unspecified: Secondary | ICD-10-CM | POA: Diagnosis not present

## 2015-11-21 DIAGNOSIS — R1031 Right lower quadrant pain: Secondary | ICD-10-CM | POA: Diagnosis not present

## 2015-11-21 DIAGNOSIS — E860 Dehydration: Secondary | ICD-10-CM | POA: Diagnosis not present

## 2015-11-21 DIAGNOSIS — R1032 Left lower quadrant pain: Secondary | ICD-10-CM | POA: Diagnosis not present

## 2015-11-21 DIAGNOSIS — Z7901 Long term (current) use of anticoagulants: Secondary | ICD-10-CM | POA: Diagnosis not present

## 2015-11-22 DIAGNOSIS — E860 Dehydration: Secondary | ICD-10-CM | POA: Diagnosis not present

## 2015-11-22 DIAGNOSIS — R112 Nausea with vomiting, unspecified: Secondary | ICD-10-CM | POA: Diagnosis not present

## 2015-11-23 DIAGNOSIS — I4891 Unspecified atrial fibrillation: Secondary | ICD-10-CM | POA: Diagnosis not present

## 2015-11-25 ENCOUNTER — Ambulatory Visit (INDEPENDENT_AMBULATORY_CARE_PROVIDER_SITE_OTHER): Payer: PPO | Admitting: Cardiovascular Disease

## 2015-11-25 ENCOUNTER — Encounter: Payer: PPO | Admitting: Cardiovascular Disease

## 2015-11-25 VITALS — BP 122/76 | HR 83 | Ht 68.0 in | Wt 168.0 lb

## 2015-11-25 DIAGNOSIS — I25118 Atherosclerotic heart disease of native coronary artery with other forms of angina pectoris: Secondary | ICD-10-CM | POA: Diagnosis not present

## 2015-11-25 DIAGNOSIS — Z87898 Personal history of other specified conditions: Secondary | ICD-10-CM | POA: Diagnosis not present

## 2015-11-25 DIAGNOSIS — R079 Chest pain, unspecified: Secondary | ICD-10-CM | POA: Diagnosis not present

## 2015-11-25 DIAGNOSIS — I482 Chronic atrial fibrillation, unspecified: Secondary | ICD-10-CM

## 2015-11-25 DIAGNOSIS — Z9289 Personal history of other medical treatment: Secondary | ICD-10-CM

## 2015-11-25 NOTE — Patient Instructions (Signed)
Your physician recommends that you schedule a follow-up appointment in: 2 months with Dr Bronson Ing    Your physician recommends that you continue on your current medications as directed. Please refer to the Current Medication list given to you today.     If you need a refill on your cardiac medications before your next appointment, please call your pharmacy.     Thank you for choosing [facility] !

## 2015-11-25 NOTE — Progress Notes (Signed)
Patient ID: [name], male   DOB: [birth year], 82 y.o.   MRN: [id number]      SUBJECTIVE: The patient is an 82 year old male with a history of nonobstructive coronary artery disease and chronic atrial fibrillation whom I recently evaluated in January and was found to be stable at that time.  He underwent a low risk nuclear stress test on 12/17/13 with no evidence of ischemia, LVEF 61%.  Echocardiogram on 04/10/13 demonstrated normal left ventricular systolic function, EF 34-19%, mild LVH, mild to moderate left atrial dilatation, and mild mitral regurgitation.  He was recently evaluated at [facility] for chest pain. I am trying to obtain those records. He tells me he was sitting in a chair last Friday when he began to experience chest pressure in the precordial region, left arm pain, and left scapular pain. He brought in some blood tests which showed normal troponin, d-dimer 0.16, Hgb 12.8, WBC 7, platelets 193, BUN 14, creatinine 1.17, BNP 405.  He was apparently discharged on Saturday and readmitted Sunday with nausea, vomiting, and diarrhea. He is gradually recovering and is on a liquid and soft food diet.  Denies exertional chest pain and dyspnea.  Had been monitoring BP at home with SBP 120 mmHg range. Reportedly elevated in hospital with SBP's in 140 range.   Review of Systems: As per "subjective", otherwise negative.  No Known Allergies  Current Outpatient Prescriptions  Medication Sig Dispense Refill  . carvedilol (COREG) 6.25 MG tablet Take 1 tablet (6.25 mg total) by mouth 2 (two) times daily. 60 tablet 6  . diltiazem (CARDIZEM CD) 180 MG 24 hr capsule Take 180 mg by mouth daily.      Marland Kitchen levothyroxine (SYNTHROID, LEVOTHROID) 75 MCG tablet Take 75 mcg by mouth daily before breakfast.    . magnesium gluconate (MAGONATE) 500 MG tablet Take 500 mg by mouth daily.     . Melatonin 1 MG CAPS Take 2 mg by mouth at bedtime as needed.     . nitroGLYCERIN (NITROSTAT) 0.4 MG SL  tablet Place 0.4 mg under the tongue every 5 (five) minutes as needed. For chest pains. May repeat for up to 3 doses.    Marland Kitchen omeprazole (PRILOSEC) 20 MG capsule Take 40 mg by mouth daily.    . Probiotic Product (ALIGN PO) Take 1 capsule by mouth daily.    . ranitidine (ZANTAC) 150 MG tablet Take 1 at bedtime    . simvastatin (ZOCOR) 10 MG tablet Take 10 mg by mouth at bedtime.     . Tamsulosin HCl (FLOMAX) 0.4 MG CAPS Take 0.4 mg by mouth every evening.     . warfarin (COUMADIN) 2.5 MG tablet Take 2.5 mg by mouth daily. Take 1 tablet daily except 1 1/2 tablets on Wednesdays and Saturdays     No current facility-administered medications for this visit.    Past Medical History  Diagnosis Date  . Other malaise and fatigue   . CAD (coronary artery disease)     Catheterization 2004, mild/moderate nonobstructive disease  /   nuclear, 2007, small inferior scar // no ischemia  . Atrial fibrillation ([place])     Permanent  . GERD (gastroesophageal reflux disease)   . Clot     LA, small, in past  . Ejection fraction 02/2009    EF 55-60%,Echo, May, 2010  . LVH (left ventricular hypertrophy) 02/2009     Mild, echo, 2010  . Mitral regurgitation     Mild, echo, flat closure  . Elevated  PSA   . Nausea vomiting and diarrhea 01/2010    Hospitalization, probably viral (or partial small bowel obstruction)/ Hospital 09/2010, recurrent possible partial small bowel obstruction, medical therapy  . Dizziness 03/2010    June, 2011   Mild, stop digoxin, felt better  . Carotid bruit     Doppler, December 23, 2010, no significant carotid disease  . Warfarin anticoagulation     Atrial fibrillation    Past Surgical History  Procedure Laterality Date  . Cardiac catheterization  2004  . Colonoscopy  2008    [facility]  . Cholecystectomy  2010    Dr. Anthony Sar  . Agile capsule  10/18/2011    Procedure: AGILE CAPSULE;  Surgeon: Rogene Houston, MD;  [facility];  Service: Endoscopy;  Laterality: N/A;  730    . Esophagogastroduodenoscopy  11/08/2011    Procedure: ESOPHAGOGASTRODUODENOSCOPY (EGD);  Surgeon: Rogene Houston, MD;  [facility];  Service: Endoscopy;  Laterality: N/A;  300    Social History   Social History  . Marital Status: Married    Spouse Name: N/A  . Number of Children: N/A  . Years of Education: N/A   Occupational History  . Retired-Hospital Mudlogger     Social History Main Topics  . Smoking status: Never Smoker   . Smokeless tobacco: Never Used  . Alcohol Use: No  . Drug Use: No  . Sexual Activity: Not on file   Other Topics Concern  . Not on file   Social History Narrative   Married     Filed Vitals:   11/25/15 1319  BP: 122/76  Pulse: 83  Height: 5' 8"  (1.727 m)  Weight: 168 lb (76.204 kg)  SpO2: 96%    PHYSICAL EXAM General: NAD HEENT: Normal. Neck: No JVD, no thyromegaly. Lungs: Clear to auscultation bilaterally with normal respiratory effort. CV: Nondisplaced PMI. Regular rate and irregular rhythm, normal S1/S2, no S3, no murmur. No pretibial or periankle edema. No carotid bruit.  Abdomen: Nontender, firm, no distention.  Neurologic: Alert and oriented x 3.  Psych: Normal affect. Skin: Normal. Musculoskeletal: No gross deformities. Extremities: No clubbing or cyanosis.   ECG: Most recent ECG reviewed.      ASSESSMENT AND PLAN: 1. Chest pain: No recurrences. Will obtain records from [facility]. Will continue to monitor symptoms as prior symptoms may have been due to brewing viral gastroenteritis. If they recur, would obtain a nuclear stress test (Lexiscan).  2. Chronic atrial fibrillation: Symptomatically stable. Continue diltiazem and warfarin.  3. CAD: Nonobstructive. Normal nuclear stress test as noted above.  If chest pain symptoms recur, would obtain a nuclear stress test (Lexiscan).  Dispo: f/u 2 months. Have recommended they see if their home BP apparatus is calibrated appropriately by having it checked at our  [place] office.  Time spent: 40 minutes, of which greater than 50% was spent reviewing symptoms, relevant blood tests and studies, and discussing management plan with the patient.   Kate Sable, M.D., F.A.C.C.

## 2015-12-02 ENCOUNTER — Ambulatory Visit (INDEPENDENT_AMBULATORY_CARE_PROVIDER_SITE_OTHER): Payer: PPO | Admitting: *Deleted

## 2015-12-02 DIAGNOSIS — Z7901 Long term (current) use of anticoagulants: Secondary | ICD-10-CM

## 2015-12-02 DIAGNOSIS — I4891 Unspecified atrial fibrillation: Secondary | ICD-10-CM

## 2015-12-02 DIAGNOSIS — Z5181 Encounter for therapeutic drug level monitoring: Secondary | ICD-10-CM | POA: Diagnosis not present

## 2015-12-02 LAB — POCT INR: INR: 2.4

## 2015-12-02 NOTE — Progress Notes (Signed)
Patient was told to come to the office to have his home BP monitor checked with the office BP monitor. Home BP 113/72 and office BP 114/74. Patient informed that his home BP monitor was accurate.

## 2015-12-04 ENCOUNTER — Other Ambulatory Visit: Payer: Self-pay | Admitting: Cardiology

## 2016-01-06 DIAGNOSIS — K859 Acute pancreatitis without necrosis or infection, unspecified: Secondary | ICD-10-CM | POA: Diagnosis not present

## 2016-01-06 DIAGNOSIS — Z79899 Other long term (current) drug therapy: Secondary | ICD-10-CM | POA: Diagnosis not present

## 2016-01-06 DIAGNOSIS — R079 Chest pain, unspecified: Secondary | ICD-10-CM | POA: Diagnosis not present

## 2016-01-06 DIAGNOSIS — I482 Chronic atrial fibrillation: Secondary | ICD-10-CM | POA: Diagnosis not present

## 2016-01-06 DIAGNOSIS — Z7901 Long term (current) use of anticoagulants: Secondary | ICD-10-CM | POA: Diagnosis not present

## 2016-01-07 DIAGNOSIS — R079 Chest pain, unspecified: Secondary | ICD-10-CM | POA: Diagnosis not present

## 2016-01-07 DIAGNOSIS — K859 Acute pancreatitis without necrosis or infection, unspecified: Secondary | ICD-10-CM | POA: Diagnosis not present

## 2016-01-13 ENCOUNTER — Ambulatory Visit (INDEPENDENT_AMBULATORY_CARE_PROVIDER_SITE_OTHER): Payer: PPO | Admitting: *Deleted

## 2016-01-13 DIAGNOSIS — I4891 Unspecified atrial fibrillation: Secondary | ICD-10-CM

## 2016-01-13 DIAGNOSIS — Z5181 Encounter for therapeutic drug level monitoring: Secondary | ICD-10-CM | POA: Diagnosis not present

## 2016-01-13 DIAGNOSIS — Z7901 Long term (current) use of anticoagulants: Secondary | ICD-10-CM

## 2016-01-13 LAB — POCT INR: INR: 2

## 2016-01-20 ENCOUNTER — Ambulatory Visit (INDEPENDENT_AMBULATORY_CARE_PROVIDER_SITE_OTHER): Payer: PPO | Admitting: Cardiovascular Disease

## 2016-01-20 ENCOUNTER — Encounter: Payer: Self-pay | Admitting: Cardiovascular Disease

## 2016-01-20 VITALS — BP 103/64 | HR 79 | Ht 68.0 in | Wt 161.0 lb

## 2016-01-20 DIAGNOSIS — Z7901 Long term (current) use of anticoagulants: Secondary | ICD-10-CM

## 2016-01-20 DIAGNOSIS — R072 Precordial pain: Secondary | ICD-10-CM | POA: Diagnosis not present

## 2016-01-20 DIAGNOSIS — I482 Chronic atrial fibrillation, unspecified: Secondary | ICD-10-CM

## 2016-01-20 DIAGNOSIS — I2510 Atherosclerotic heart disease of native coronary artery without angina pectoris: Secondary | ICD-10-CM | POA: Diagnosis not present

## 2016-01-20 DIAGNOSIS — Z9289 Personal history of other medical treatment: Secondary | ICD-10-CM

## 2016-01-20 DIAGNOSIS — A084 Viral intestinal infection, unspecified: Secondary | ICD-10-CM | POA: Diagnosis not present

## 2016-01-20 DIAGNOSIS — Z87898 Personal history of other specified conditions: Secondary | ICD-10-CM | POA: Diagnosis not present

## 2016-01-20 DIAGNOSIS — Z5181 Encounter for therapeutic drug level monitoring: Secondary | ICD-10-CM

## 2016-01-20 DIAGNOSIS — I10 Essential (primary) hypertension: Secondary | ICD-10-CM | POA: Diagnosis not present

## 2016-01-20 NOTE — Progress Notes (Signed)
Patient ID: [name], male   DOB: 1931-09-29, 82 y.o.   MRN: [id number]      SUBJECTIVE: The patient returns for routine follow up. He was hospitalized at [facility] for viral gastroenteritis in February.  He underwent a low risk nuclear stress test on 12/17/13 with no evidence of ischemia, LVEF 61%.  Echocardiogram on 04/10/13 demonstrated normal left ventricular systolic function, EF 84-13%, mild LVH, mild to moderate left atrial dilatation, and mild mitral regurgitation.  I am told he was recently hospitalized for pancreatitis in March. ECG showed atrial fibrillation with PVCs, heart rate 96 bpm. [facility] were normal on 01/07/16. They have a detailed record of blood pressures and heart rate and all are within normal limits. He denies exertional chest pain, shortness of breath, palpitations, and leg swelling. His wife has several questions with regards to dietary restrictions.  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   Review of Systems: As per "subjective", otherwise negative.  No Known Allergies  Current Outpatient Prescriptions  Medication Sig Dispense Refill  . carvedilol (COREG) 6.25 MG tablet Take 1 tablet (6.25 mg total) by mouth 2 (two) times daily. 60 tablet 6  . diltiazem (CARDIZEM CD) 180 MG 24 hr capsule Take 180 mg by mouth 2 (two) times daily.     Marland Kitchen levothyroxine (SYNTHROID, LEVOTHROID) 75 MCG tablet Take 75 mcg by mouth daily before breakfast.    . magnesium gluconate (MAGONATE) 500 MG tablet Take 500 mg by mouth daily.     . Melatonin 1 MG CAPS Take 3 mg by mouth at bedtime as needed.     . nitroGLYCERIN (NITROSTAT) 0.4 MG SL tablet Place 0.4 mg under the tongue every 5 (five) minutes as needed. For chest pains. May repeat for up to 3 doses.    Marland Kitchen omeprazole (PRILOSEC) 20 MG capsule Take 40 mg by mouth daily.    . ondansetron (ZOFRAN-ODT) 4 MG disintegrating tablet Take 4 mg by mouth every 8 (eight) hours as needed for nausea or vomiting.    . Probiotic  Product (ALIGN PO) Take 1 capsule by mouth daily.    . simvastatin (ZOCOR) 10 MG tablet Take 10 mg by mouth at bedtime.     . Tamsulosin HCl (FLOMAX) 0.4 MG CAPS Take 0.4 mg by mouth every evening.     . warfarin (COUMADIN) 2.5 MG tablet Take 2.5 mg by mouth daily. Take 1 tablet daily except 1 1/2 tablets on Wednesdays and Saturdays     No current facility-administered medications for this visit.    Past Medical History  Diagnosis Date  . Other malaise and fatigue   . CAD (coronary artery disease)     Catheterization 2004, mild/moderate nonobstructive disease  /   nuclear, 2007, small inferior scar // no ischemia  . Atrial fibrillation ([place])     Permanent  . GERD (gastroesophageal reflux disease)   . Clot     LA, small, in past  . Ejection fraction 02/2009    EF 55-60%,Echo, May, 2010  . LVH (left ventricular hypertrophy) 02/2009     Mild, echo, 2010  . Mitral regurgitation     Mild, echo, flat closure  . Elevated PSA   . Nausea vomiting and diarrhea 01/2010    Hospitalization, probably viral (or partial small bowel obstruction)/ Hospital 09/2010, recurrent possible partial small bowel obstruction, medical therapy  . Dizziness 03/2010    June, 2011   Mild, stop digoxin, felt better  . Carotid bruit  Doppler, December 23, 2010, no significant carotid disease  . Warfarin anticoagulation     Atrial fibrillation    Past Surgical History  Procedure Laterality Date  . Cardiac catheterization  2004  . Colonoscopy  2008    [facility]  . Cholecystectomy  2010    Dr. Anthony Sar  . Agile capsule  10/18/2011    Procedure: AGILE CAPSULE;  Surgeon: Rogene Houston, MD;  [facility];  Service: Endoscopy;  Laterality: N/A;  730  . Esophagogastroduodenoscopy  11/08/2011    Procedure: ESOPHAGOGASTRODUODENOSCOPY (EGD);  Surgeon: Rogene Houston, MD;  [facility];  Service: Endoscopy;  Laterality: N/A;  300    Social History   Social History  . Marital Status: Married     Spouse Name: N/A  . Number of Children: N/A  . Years of Education: N/A   Occupational History  . Retired-Hospital Mudlogger     Social History Main Topics  . Smoking status: Never Smoker   . Smokeless tobacco: Never Used  . Alcohol Use: No  . Drug Use: No  . Sexual Activity: Not on file   Other Topics Concern  . Not on file   Social History Narrative   Married     Filed Vitals:   01/20/16 1015  BP: 103/64  Pulse: 79  Height: 5' 8"  (1.727 m)  Weight: 161 lb (73.029 kg)    PHYSICAL EXAM General: NAD HEENT: Normal. Neck: No JVD, no thyromegaly. Lungs: Clear to auscultation bilaterally with normal respiratory effort. CV: Nondisplaced PMI. Regular rate and irregular rhythm, normal S1/S2, no S3, no murmur. No pretibial or periankle edema.  Abdomen: Nontender, no distention.  Neurologic: Alert and oriented x 3.  Psych: Normal affect. Skin: Normal. Musculoskeletal: No gross deformities. Extremities: No clubbing or cyanosis.   ECG: Most recent ECG reviewed.    ASSESSMENT AND PLAN: 1. Chronic atrial fibrillation: Symptomatically stable. Continue diltiazem, Coreg, and warfarin. We had a long discussion regarding direct oral anticoagulants, and they are going to think about switching.  2. CAD: Nonobstructive. Normal nuclear stress test as noted above. If chest pain symptoms recur, would obtain a nuclear stress test (Lexiscan).  Dispo: fu 1 year.  Time spent: 40 minutes, of which greater than 50% was spent reviewing symptoms, relevant blood tests and studies, and discussing management plan with the patient.   Kate Sable, M.D., F.A.C.C.

## 2016-01-20 NOTE — Patient Instructions (Signed)

## 2016-01-25 ENCOUNTER — Ambulatory Visit: Payer: PPO | Admitting: Cardiovascular Disease

## 2016-01-31 DIAGNOSIS — N183 Chronic kidney disease, stage 3 (moderate): Secondary | ICD-10-CM | POA: Diagnosis not present

## 2016-01-31 DIAGNOSIS — K210 Gastro-esophageal reflux disease with esophagitis: Secondary | ICD-10-CM | POA: Diagnosis not present

## 2016-01-31 DIAGNOSIS — E039 Hypothyroidism, unspecified: Secondary | ICD-10-CM | POA: Diagnosis not present

## 2016-01-31 DIAGNOSIS — I10 Essential (primary) hypertension: Secondary | ICD-10-CM | POA: Diagnosis not present

## 2016-01-31 DIAGNOSIS — Z13220 Encounter for screening for lipoid disorders: Secondary | ICD-10-CM | POA: Diagnosis not present

## 2016-01-31 DIAGNOSIS — R7301 Impaired fasting glucose: Secondary | ICD-10-CM | POA: Diagnosis not present

## 2016-01-31 DIAGNOSIS — E7800 Pure hypercholesterolemia, unspecified: Secondary | ICD-10-CM | POA: Diagnosis not present

## 2016-02-02 DIAGNOSIS — K210 Gastro-esophageal reflux disease with esophagitis: Secondary | ICD-10-CM | POA: Diagnosis not present

## 2016-02-02 DIAGNOSIS — Z1389 Encounter for screening for other disorder: Secondary | ICD-10-CM | POA: Diagnosis not present

## 2016-02-02 DIAGNOSIS — Z0001 Encounter for general adult medical examination with abnormal findings: Secondary | ICD-10-CM | POA: Diagnosis not present

## 2016-02-02 DIAGNOSIS — I10 Essential (primary) hypertension: Secondary | ICD-10-CM | POA: Diagnosis not present

## 2016-02-02 DIAGNOSIS — N183 Chronic kidney disease, stage 3 (moderate): Secondary | ICD-10-CM | POA: Diagnosis not present

## 2016-02-02 DIAGNOSIS — R7301 Impaired fasting glucose: Secondary | ICD-10-CM | POA: Diagnosis not present

## 2016-02-02 DIAGNOSIS — I482 Chronic atrial fibrillation: Secondary | ICD-10-CM | POA: Diagnosis not present

## 2016-02-03 ENCOUNTER — Encounter (INDEPENDENT_AMBULATORY_CARE_PROVIDER_SITE_OTHER): Payer: Self-pay | Admitting: *Deleted

## 2016-02-15 ENCOUNTER — Ambulatory Visit (INDEPENDENT_AMBULATORY_CARE_PROVIDER_SITE_OTHER): Payer: PPO | Admitting: *Deleted

## 2016-02-15 DIAGNOSIS — I4891 Unspecified atrial fibrillation: Secondary | ICD-10-CM | POA: Diagnosis not present

## 2016-02-15 DIAGNOSIS — Z7901 Long term (current) use of anticoagulants: Secondary | ICD-10-CM | POA: Diagnosis not present

## 2016-02-15 DIAGNOSIS — Z5181 Encounter for therapeutic drug level monitoring: Secondary | ICD-10-CM | POA: Diagnosis not present

## 2016-02-15 LAB — POCT INR: INR: 2.3

## 2016-02-22 ENCOUNTER — Encounter (INDEPENDENT_AMBULATORY_CARE_PROVIDER_SITE_OTHER): Payer: Self-pay | Admitting: Internal Medicine

## 2016-02-22 ENCOUNTER — Other Ambulatory Visit (INDEPENDENT_AMBULATORY_CARE_PROVIDER_SITE_OTHER): Payer: Self-pay | Admitting: Internal Medicine

## 2016-02-22 ENCOUNTER — Encounter (INDEPENDENT_AMBULATORY_CARE_PROVIDER_SITE_OTHER): Payer: Self-pay | Admitting: *Deleted

## 2016-02-22 ENCOUNTER — Ambulatory Visit (INDEPENDENT_AMBULATORY_CARE_PROVIDER_SITE_OTHER): Payer: PPO | Admitting: Internal Medicine

## 2016-02-22 ENCOUNTER — Encounter (INDEPENDENT_AMBULATORY_CARE_PROVIDER_SITE_OTHER): Payer: Self-pay

## 2016-02-22 ENCOUNTER — Other Ambulatory Visit (INDEPENDENT_AMBULATORY_CARE_PROVIDER_SITE_OTHER): Payer: Self-pay | Admitting: *Deleted

## 2016-02-22 ENCOUNTER — Telehealth (INDEPENDENT_AMBULATORY_CARE_PROVIDER_SITE_OTHER): Payer: Self-pay | Admitting: *Deleted

## 2016-02-22 VITALS — BP 110/60 | HR 72 | Temp 97.5°F | Ht 68.0 in | Wt 162.4 lb

## 2016-02-22 DIAGNOSIS — R195 Other fecal abnormalities: Secondary | ICD-10-CM | POA: Diagnosis not present

## 2016-02-22 NOTE — Telephone Encounter (Signed)
Patient needs trilyte 

## 2016-02-22 NOTE — Telephone Encounter (Signed)
Patient is scheduled for colonoscopy 03/23/16 and needs to stop Warfarin 5 days prior, is this ok, please advise. Thanks

## 2016-02-22 NOTE — Telephone Encounter (Signed)
Cardiology recommendations noted

## 2016-02-22 NOTE — Progress Notes (Addendum)
Subjective:    Patient ID: [name], male    DOB: 1931/02/12, 82 y.o.   MRN: [id number]  HPI Referred by Dr. Quintin Alto for positive stool card/colonoscopy. He denies seeing any rectal bleeding. There has been no change in his stools. Stools are brown in color. Appetite is good. No weight loss. No abdominal. No GI complaints.  Last colonoscopy in 2008 in 2008 by Dr. Anthony Sar and was normal.  Hx of atrial fib and maintained on Warfarin.    Family hx significant for mother who died of colon cancer at age 34.  01/31/2016:  H and H 13.5 and 41.0, MCV 83.  11/08/2011 EGD with gastric polypectomy.  Indications: Patient is a 82 year old Caucasian male was evaluated for intermittent small bowel obstruction with CT enterography as he failed test with Agile capsule. He therefore had CT enterography. While this study did not show anything significant involving a small bowel it revealed large polyp in the stomach. He is therefore here for gastric polypectomy. He has chronic GERD and is maintained on PPI.  Impression: Two small patches of salmon-colored mucosa at distal esophagus biopsy taken to rule out short segment Barrett's. Small sliding hiatal hernia. 2 cm long gastric polyp snared from cardiac. 2 hemoclips applied as noted   Biopsy from GE junction shows changes of GERD but no Barrett's. Polyp is benign.    Review of Systems Past Medical History  Diagnosis Date  . Other malaise and fatigue   . CAD (coronary artery disease)     Catheterization 2004, mild/moderate nonobstructive disease  /   nuclear, 2007, small inferior scar // no ischemia  . Atrial fibrillation ([place])     Permanent  . GERD (gastroesophageal reflux disease)   . Clot     LA, small, in past  . Ejection fraction 02/2009    EF 55-60%,Echo, May, 2010  . LVH (left ventricular hypertrophy) 02/2009     Mild,  echo, 2010  . Mitral regurgitation     Mild, echo, flat closure  . Elevated PSA   . Nausea vomiting and diarrhea 01/2010    Hospitalization, probably viral (or partial small bowel obstruction)/ Hospital 09/2010, recurrent possible partial small bowel obstruction, medical therapy  . Dizziness 03/2010    June, 2011   Mild, stop digoxin, felt better  . Carotid bruit     Doppler, December 23, 2010, no significant carotid disease  . Warfarin anticoagulation     Atrial fibrillation    Past Surgical History  Procedure Laterality Date  . Cardiac catheterization  2004  . Colonoscopy  2008    [facility]  . Cholecystectomy  2010    Dr. Anthony Sar  . Agile capsule  10/18/2011    Procedure: AGILE CAPSULE;  Surgeon: Rogene Houston, MD;  [facility];  Service: Endoscopy;  Laterality: N/A;  730  . Esophagogastroduodenoscopy  11/08/2011    Procedure: ESOPHAGOGASTRODUODENOSCOPY (EGD);  Surgeon: Rogene Houston, MD;  [facility];  Service: Endoscopy;  Laterality: N/A;  300    No Known Allergies  Current Outpatient Prescriptions on File Prior to Visit  Medication Sig Dispense Refill  . carvedilol (COREG) 6.25 MG tablet Take 1 tablet (6.25 mg total) by mouth 2 (two) times daily. 60 tablet 6  . diltiazem (CARDIZEM CD) 180 MG 24 hr capsule Take 180 mg by mouth 2 (two) times daily.     Marland Kitchen levothyroxine (SYNTHROID, LEVOTHROID) 75 MCG tablet Take 75 mcg by mouth daily before breakfast.    .  magnesium gluconate (MAGONATE) 500 MG tablet Take 500 mg by mouth daily.     . Melatonin 3 MG TABS Take by mouth at bedtime as needed.    . nitroGLYCERIN (NITROSTAT) 0.4 MG SL tablet Place 0.4 mg under the tongue every 5 (five) minutes as needed. For chest pains. May repeat for up to 3 doses.    Marland Kitchen ondansetron (ZOFRAN-ODT) 4 MG disintegrating tablet Take 4 mg by mouth every 8 (eight) hours as needed for nausea or vomiting.    . Probiotic Product (ALIGN PO) Take 1 capsule by mouth daily.    . ranitidine  (ZANTAC) 150 MG capsule Take 300 mg by mouth every evening.    . simvastatin (ZOCOR) 10 MG tablet Take 10 mg by mouth at bedtime.     . Tamsulosin HCl (FLOMAX) 0.4 MG CAPS Take 0.4 mg by mouth every evening.     . warfarin (COUMADIN) 2.5 MG tablet Take 2.5 mg by mouth daily. Take 1 tablet daily except 1 1/2 tablets on Wednesdays and Saturdays     No current facility-administered medications on file prior to visit.        Objective:   Physical ExamBlood pressure 110/60, pulse 72, temperature 97.5 F (36.4 C), height 5' 8"  (1.727 m), weight 162 lb 6.4 oz (73.664 kg). Alert and oriented. Skin warm and dry. Oral mucosa is moist.   . Sclera anicteric, conjunctivae is pink. Thyroid not enlarged. No cervical lymphadenopathy. Lungs clear. Heart regular rate and rhythm.  Abdomen is soft. Bowel sounds are positive. No hepatomegaly. No abdominal masses felt. No tenderness.      Assessment & Plan:  Guaiac + stool. Colonic neoplasm needs to be ruled out. The risks and benefits such as perforation, bleeding, and infection were reviewed with the patient and is agreeable.ac + stool. Colonic neoplasm needs to be ruled out.

## 2016-02-22 NOTE — Patient Instructions (Signed)
The risks and benefits such as perforation, bleeding, and infection were reviewed with the patient and is agreeable.

## 2016-02-22 NOTE — Telephone Encounter (Signed)
OK to hold warfarin 5 days prior to procedure.  Restart coumadin night of procedure if OK with Dr Laural Golden.  Has INR check already scheduled for 03/28/16

## 2016-02-22 NOTE — Telephone Encounter (Signed)
Patient aware, forwarded to Dr Laural Golden for review

## 2016-02-23 MED ORDER — PEG 3350-KCL-NA BICARB-NACL 420 G PO SOLR: 4000.0000 mL | Freq: Once | ORAL | Status: DC

## 2016-02-28 DIAGNOSIS — L570 Actinic keratosis: Secondary | ICD-10-CM | POA: Diagnosis not present

## 2016-02-29 ENCOUNTER — Ambulatory Visit (INDEPENDENT_AMBULATORY_CARE_PROVIDER_SITE_OTHER): Payer: PPO | Admitting: Internal Medicine

## 2016-03-02 ENCOUNTER — Encounter (INDEPENDENT_AMBULATORY_CARE_PROVIDER_SITE_OTHER): Payer: Self-pay | Admitting: *Deleted

## 2016-03-07 ENCOUNTER — Telehealth: Payer: Self-pay | Admitting: *Deleted

## 2016-03-07 NOTE — Telephone Encounter (Signed)
Wants to know how to resume coumadin after colonoscopy.  Pt will take 3.22m x 3 days then resume 2.560mdaily except 3.7526mn Wednesdays and Saturdays.  INR appt on 03/28/16.

## 2016-03-23 ENCOUNTER — Encounter (HOSPITAL_COMMUNITY)
Admission: RE | Disposition: A | Discharge status: Home / Self Care | Payer: Self-pay | Source: Ambulatory Visit | Attending: Internal Medicine

## 2016-03-23 ENCOUNTER — Encounter (HOSPITAL_COMMUNITY): Payer: Self-pay | Admitting: *Deleted

## 2016-03-23 ENCOUNTER — Ambulatory Visit (HOSPITAL_COMMUNITY)
Admission: RE | Admit: 2016-03-23 | Discharge: 2016-03-23 | Disposition: A | Discharge status: Home / Self Care | Payer: PPO | Source: Ambulatory Visit | Attending: Internal Medicine | Admitting: Internal Medicine

## 2016-03-23 DIAGNOSIS — Z800 Family history of malignant neoplasm of digestive organs: Secondary | ICD-10-CM | POA: Diagnosis not present

## 2016-03-23 DIAGNOSIS — Z79899 Other long term (current) drug therapy: Secondary | ICD-10-CM | POA: Diagnosis not present

## 2016-03-23 DIAGNOSIS — I2510 Atherosclerotic heart disease of native coronary artery without angina pectoris: Secondary | ICD-10-CM | POA: Insufficient documentation

## 2016-03-23 DIAGNOSIS — K644 Residual hemorrhoidal skin tags: Secondary | ICD-10-CM | POA: Diagnosis not present

## 2016-03-23 DIAGNOSIS — I4891 Unspecified atrial fibrillation: Secondary | ICD-10-CM | POA: Insufficient documentation

## 2016-03-23 DIAGNOSIS — D120 Benign neoplasm of cecum: Secondary | ICD-10-CM | POA: Diagnosis not present

## 2016-03-23 DIAGNOSIS — D127 Benign neoplasm of rectosigmoid junction: Secondary | ICD-10-CM | POA: Diagnosis not present

## 2016-03-23 DIAGNOSIS — K648 Other hemorrhoids: Secondary | ICD-10-CM | POA: Diagnosis not present

## 2016-03-23 DIAGNOSIS — R195 Other fecal abnormalities: Secondary | ICD-10-CM

## 2016-03-23 DIAGNOSIS — Z7901 Long term (current) use of anticoagulants: Secondary | ICD-10-CM | POA: Insufficient documentation

## 2016-03-23 DIAGNOSIS — K5730 Diverticulosis of large intestine without perforation or abscess without bleeding: Secondary | ICD-10-CM | POA: Insufficient documentation

## 2016-03-23 DIAGNOSIS — I340 Nonrheumatic mitral (valve) insufficiency: Secondary | ICD-10-CM | POA: Diagnosis not present

## 2016-03-23 DIAGNOSIS — K219 Gastro-esophageal reflux disease without esophagitis: Secondary | ICD-10-CM | POA: Insufficient documentation

## 2016-03-23 HISTORY — PX: COLONOSCOPY: SHX5424

## 2016-03-23 HISTORY — PX: POLYPECTOMY: SHX5525

## 2016-03-23 SURGERY — COLONOSCOPY
Anesthesia: Moderate Sedation

## 2016-03-23 MED ORDER — MEPERIDINE HCL 50 MG/ML IJ SOLN
INTRAMUSCULAR | Status: DC | PRN
  Administered 2016-03-23: 25 mg via INTRAVENOUS
  Administered 2016-03-23: 15 mg via INTRAVENOUS

## 2016-03-23 MED ORDER — MEPERIDINE HCL 50 MG/ML IJ SOLN
INTRAMUSCULAR | Status: DC
Start: 2016-03-23 — End: 2016-03-23
  Filled 2016-03-23: qty 1

## 2016-03-23 MED ORDER — MIDAZOLAM HCL 5 MG/5ML IJ SOLN
INTRAMUSCULAR | Status: AC
  Filled 2016-03-23: qty 10

## 2016-03-23 MED ORDER — MIDAZOLAM HCL 5 MG/5ML IJ SOLN
INTRAMUSCULAR | Status: DC | PRN
  Administered 2016-03-23 (×2): 1 mg via INTRAVENOUS

## 2016-03-23 MED ORDER — STERILE WATER FOR IRRIGATION IR SOLN
Status: DC | PRN
  Administered 2016-03-23: 13:00:00

## 2016-03-23 MED ORDER — SODIUM CHLORIDE 0.9 % IV SOLN
INTRAVENOUS | Status: DC
  Administered 2016-03-23: 1000 mL via INTRAVENOUS

## 2016-03-23 NOTE — Discharge Instructions (Signed)
Resume usual medications including warfarin at usual dose. Resume usual diet. No driving for 24 hours. Please get INR checked in 7-10 days. Physician will call with biopsy results.  Colonoscopy, Care After These instructions give you information on caring for yourself after your procedure. Your doctor may also give you more specific instructions. Call your doctor if you have any problems or questions after your procedure. HOME CARE  Do not drive for 24 hours.  Do not sign important papers or use machinery for 24 hours.  You may shower.  You may go back to your usual activities, but go slower for the first 24 hours.  Take rest breaks often during the first 24 hours.  Walk around or use warm packs on your belly (abdomen) if you have belly cramping or gas.  Drink enough fluids to keep your pee (urine) clear or pale yellow.  Resume your normal diet. Avoid heavy or fried foods.  Avoid drinking alcohol for 24 hours or as told by your doctor.  Only take medicines as told by your doctor. If a tissue sample (biopsy) was taken during the procedure:   Do not take aspirin or blood thinners for 7 days, or as told by your doctor.  Do not drink alcohol for 7 days, or as told by your doctor.  Eat soft foods for the first 24 hours. GET HELP IF: You still have a small amount of blood in your poop (stool) 2-3 days after the procedure. GET HELP RIGHT AWAY IF:  You have more than a small amount of blood in your poop.  You see clumps of tissue (blood clots) in your poop.  Your belly is puffy (swollen).  You feel sick to your stomach (nauseous) or throw up (vomit).  You have a fever.  You have belly pain that gets worse and medicine does not help. MAKE SURE YOU:  Understand these instructions.  Will watch your condition.  Will get help right away if you are not doing well or get worse.   This information is not intended to replace advice given to you by your health care provider.  Make sure you discuss any questions you have with your health care provider.   Document Released: 11/04/2010 Document Revised: 10/07/2013 Document Reviewed: 06/09/2013 Elsevier Interactive Patient Education [employer].  Diverticulosis Diverticulosis is the condition that develops when small pouches (diverticula) form in the wall of your colon. Your colon, or large intestine, is where water is absorbed and stool is formed. The pouches form when the inside layer of your colon pushes through weak spots in the outer layers of your colon. CAUSES  No one knows exactly what causes diverticulosis. RISK FACTORS  Being older than 45. Your risk for this condition increases with age. Diverticulosis is rare in people younger than 40 years. By age 63, almost everyone has it.  Eating a low-fiber diet.  Being frequently constipated.  Being overweight.  Not getting enough exercise.  Smoking.  Taking over-the-counter pain medicines, like aspirin and ibuprofen. SYMPTOMS  Most people with diverticulosis do not have symptoms. DIAGNOSIS  Because diverticulosis often has no symptoms, health care providers often discover the condition during an exam for other colon problems. In many cases, a health care provider will diagnose diverticulosis while using a flexible scope to examine the colon (colonoscopy). TREATMENT  If you have never developed an infection related to diverticulosis, you may not need treatment. If you have had an infection before, treatment may include:  Eating more fruits,  vegetables, and grains.  Taking a fiber supplement.  Taking a live bacteria supplement (probiotic).  Taking medicine to relax your colon. HOME CARE INSTRUCTIONS   Drink at least 6-8 glasses of water each day to prevent constipation.  Try not to strain when you have a bowel movement.  Keep all follow-up appointments. If you have had an infection before:  Increase the fiber in your diet as directed by  your health care provider or dietitian.  Take a dietary fiber supplement if your health care provider approves.  Only take medicines as directed by your health care provider. SEEK MEDICAL CARE IF:   You have abdominal pain.  You have bloating.  You have cramps.  You have not gone to the bathroom in 3 days. SEEK IMMEDIATE MEDICAL CARE IF:   Your pain gets worse.  Yourbloating becomes very bad.  You have a fever or chills, and your symptoms suddenly get worse.  You begin vomiting.  You have bowel movements that are bloody or black. MAKE SURE YOU:  Understand these instructions.  Will watch your condition.  Will get help right away if you are not doing well or get worse.   This information is not intended to replace advice given to you by your health care provider. Make sure you discuss any questions you have with your health care provider.   Document Released: 06/29/2004 Document Revised: 10/07/2013 Document Reviewed: 08/27/2013 Elsevier Interactive Patient Education 2016 [employer]   Colon Polyps Polyps are lumps of extra tissue growing inside the body. Polyps can grow in the large intestine (colon). Most colon polyps are noncancerous (benign). However, some colon polyps can become cancerous over time. Polyps that are larger than a pea may be harmful. To be safe, caregivers remove and test all polyps. CAUSES  Polyps form when mutations in the genes cause your cells to grow and divide even though no more tissue is needed. RISK FACTORS There are a number of risk factors that can increase your chances of getting colon polyps. They include:  Being older than 50 years.  Family history of colon polyps or colon cancer.  Long-term colon diseases, such as colitis or Crohn disease.  Being overweight.  Smoking.  Being inactive.  Drinking too much alcohol. SYMPTOMS  Most small polyps do not cause symptoms. If symptoms are present, they may include:  Blood in the  stool. The stool may look dark red or black.  Constipation or diarrhea that lasts longer than 1 week. DIAGNOSIS People often do not know they have polyps until their caregiver finds them during a regular checkup. Your caregiver can use 4 tests to check for polyps:  Digital rectal exam. The caregiver wears gloves and feels inside the rectum. This test would find polyps only in the rectum.  Barium enema. The caregiver puts a liquid called barium into your rectum before taking X-rays of your colon. Barium makes your colon look white. Polyps are dark, so they are easy to see in the X-ray pictures.  Sigmoidoscopy. A thin, flexible tube (sigmoidoscope) is placed into your rectum. The sigmoidoscope has a light and tiny camera in it. The caregiver uses the sigmoidoscope to look at the last third of your colon.  Colonoscopy. This test is like sigmoidoscopy, but the caregiver looks at the entire colon. This is the most common method for finding and removing polyps. TREATMENT  Any polyps will be removed during a sigmoidoscopy or colonoscopy. The polyps are then tested for cancer. PREVENTION  To help  lower your risk of getting more colon polyps:  Eat plenty of fruits and vegetables. Avoid eating fatty foods.  Do not smoke.  Avoid drinking alcohol.  Exercise every day.  Lose weight if recommended by your caregiver.  Eat plenty of calcium and folate. Foods that are rich in calcium include milk, cheese, and broccoli. Foods that are rich in folate include chickpeas, kidney beans, and spinach. HOME CARE INSTRUCTIONS Keep all follow-up appointments as directed by your caregiver. You may need periodic exams to check for polyps. SEEK MEDICAL CARE IF: You notice bleeding during a bowel movement.   This information is not intended to replace advice given to you by your health care provider. Make sure you discuss any questions you have with your health care provider.   Document Released: 06/28/2004  Document Revised: 10/23/2014 Document Reviewed: 12/12/2011 Elsevier Interactive Patient Education [employer].

## 2016-03-23 NOTE — Op Note (Signed)
[facility] Patient Name: [name] Procedure Date: 03/23/2016 12:53 PM MRN: [id number] Date of Birth: [birth year] Attending MD: Hildred Laser , MD CSN: [id number] Age: 82 Admit Type: Outpatient Procedure:                Colonoscopy Indications:              Heme positive stool, Family history of colon cancer                            in a first-degree relative Providers:                Hildred Laser, MD, Renda Rolls, RN, Cleda Clarks, Technologist Referring MD:             Manon Hilding, MD Medicines:                Meperidine 40 mg IV, Midazolam 2 mg IV Complications:            No immediate complications. Estimated Blood Loss:     Estimated blood loss was minimal. Procedure:                Pre-Anesthesia Assessment:                           - Prior to the procedure, a History and Physical                            was performed, and patient medications and                            allergies were reviewed. The patient's tolerance of                            previous anesthesia was also reviewed. The risks                            and benefits of the procedure and the sedation                            options and risks were discussed with the patient.                            All questions were answered, and informed consent                            was obtained. Prior Anticoagulants: The patient                            last took Coumadin (warfarin) 5 days prior to the                            procedure. ASA Grade Assessment: III - A patient  with severe systemic disease. After reviewing the                            risks and benefits, the patient was deemed in                            satisfactory condition to undergo the procedure.                           After obtaining informed consent, the colonoscope                            was passed under direct vision. Throughout the                             procedure, the patient's blood pressure, pulse, and                            oxygen saturations were monitored continuously. The                            EC-[id number] ([id number]) scope was introduced through                            the anus and advanced to the the cecum, identified                            by appendiceal orifice and ileocecal valve. The                            colonoscopy was performed without difficulty. The                            patient tolerated the procedure well. The quality                            of the bowel preparation was excellent. The                            ileocecal valve, appendiceal orifice, and rectum                            were photographed. Scope In: Scope Out: 1:34:31 PM Scope Withdrawal Time: 0 hours 16 minutes 52 seconds  Findings:      Two sessile polyps were found in the recto-sigmoid colon and cecum. The       polyps were small in size. These polyps were removed with a cold snare.       Resection and retrieval were complete. The pathology specimen was placed       into Bottle Number 1.      A few small-mouthed diverticula were found in the sigmoid colon.      External internal hemorrhoids were found during retroflexion. The       hemorrhoids were small. Impression:               -  Two small polyps at the recto-sigmoid colon and                            in the cecum, removed with a cold snare. Resected                            and retrieved.                           - Mild diverticulosis in the sigmoid colon.                           - External and internal hemorrhoids. Moderate Sedation:      Moderate (conscious) sedation was administered by the endoscopy nurse       and supervised by the endoscopist. The following parameters were       monitored: oxygen saturation, heart rate, blood pressure, CO2       capnography and response to care. Total physician intraservice time was       27  minutes. Recommendation:           - Patient has a contact number available for                            emergencies. The signs and symptoms of potential                            delayed complications were discussed with the                            patient. Return to normal activities tomorrow.                            Written discharge instructions were provided to the                            patient.                           - Resume previous diet today.                           - Continue present medications.                           - Resume Coumadin (warfarin) at prior dose today.                            iNR in 7-10 days.                           - Await pathology results.                           - No repeat colonoscopy due to age. Procedure Code(s):        --- Professional ---  [phone], Colonoscopy, flexible; with removal of                            tumor(s), polyp(s), or other lesion(s) by snare                            technique                           99152, Moderate sedation services provided by the                            same physician or other qualified health care                            professional performing the diagnostic or                            therapeutic service that the sedation supports,                            requiring the presence of an independent trained                            observer to assist in the monitoring of the                            patient's level of consciousness and physiological                            status; initial 15 minutes of intraservice time,                            patient age 39 years or older                           [phone], Moderate sedation services; each additional                            15 minutes intraservice time Diagnosis Code(s):        --- Professional ---                           D12.7, Benign neoplasm of rectosigmoid junction                            D12.0, Benign neoplasm of cecum                           K64.8, Other hemorrhoids                           R19.5, Other fecal abnormalities                           Z80.0, Family history of malignant neoplasm of  digestive organs                           K57.30, Diverticulosis of large intestine without                            perforation or abscess without bleeding CPT copyright 2016 American Medical Association. All rights reserved. The codes documented in this report are preliminary and upon coder review may  be revised to meet current compliance requirements. Hildred Laser, MD Hildred Laser, MD 03/23/2016 1:42:59 PM This report has been signed electronically. Number of Addenda: 0

## 2016-03-23 NOTE — H&P (Signed)
[name] is an 82 y.o. male.   Chief Complaint: Patient is here for colonoscopy. HPI: Patient is 82 year old Caucasian male who was recently noted to have heme-positive stool. He denies rectal bleeding melena diarrhea or constipation. [facility] His hemoglobin weeks ago was 13.5 g. Family history significant for CRC in mother who died at age 45. Warfarin is on hold for 5 days.  Past Medical History  Diagnosis Date  . Other malaise and fatigue   . CAD (coronary artery disease)     Catheterization 2004, mild/moderate nonobstructive disease  /   nuclear, 2007, small inferior scar // no ischemia  . Atrial fibrillation ([place])     Permanent  . GERD (gastroesophageal reflux disease)   . Clot     LA, small, in past  . Ejection fraction 02/2009    EF 55-60%,Echo, May, 2010  . LVH (left ventricular hypertrophy) 02/2009     Mild, echo, 2010  . Mitral regurgitation     Mild, echo, flat closure  . Elevated PSA   . Nausea vomiting and diarrhea 01/2010    Hospitalization, probably viral (or partial small bowel obstruction)/ Hospital 09/2010, recurrent possible partial small bowel obstruction, medical therapy  . Dizziness 03/2010    June, 2011   Mild, stop digoxin, felt better  . Carotid bruit     Doppler, December 23, 2010, no significant carotid disease  . Warfarin anticoagulation     Atrial fibrillation    Past Surgical History  Procedure Laterality Date  . Cardiac catheterization  2004  . Colonoscopy  2008    [facility]  . Cholecystectomy  2010    Dr. Anthony Sar  . Agile capsule  10/18/2011    Procedure: AGILE CAPSULE;  Surgeon: Rogene Houston, MD;  Location: [facility];  Service: Endoscopy;  Laterality: N/A;  730  . Esophagogastroduodenoscopy  11/08/2011    Procedure: ESOPHAGOGASTRODUODENOSCOPY (EGD);  Surgeon: Rogene Houston, MD;  Location: [facility];  Service: Endoscopy;  Laterality: N/A;  300    Family History  Problem Relation Age of  Onset  . Colon cancer Mother   . Heart disease Father   . Stroke Brother   . Healthy Daughter    Social History:  reports that he has never smoked. He has never used smokeless tobacco. He reports that he does not drink alcohol or use illicit drugs.  Allergies: No Known Allergies  Medications Prior to Admission  Medication Sig Dispense Refill  . acetaminophen (TYLENOL) 500 MG tablet Take 500 mg by mouth every 6 (six) hours as needed.    . carvedilol (COREG) 6.25 MG tablet Take 1 tablet (6.25 mg total) by mouth 2 (two) times daily. 60 tablet 6  . diltiazem (CARDIZEM CD) 180 MG 24 hr capsule Take 180 mg by mouth 2 (two) times daily.     Marland Kitchen levothyroxine (SYNTHROID, LEVOTHROID) 75 MCG tablet Take 75 mcg by mouth daily before breakfast.    . magnesium gluconate (MAGONATE) 500 MG tablet Take 500 mg by mouth daily.     . Melatonin 3 MG TABS Take by mouth at bedtime as needed.    . polyethylene glycol-electrolytes (NULYTELY/GOLYTELY) 420 g solution Take 4,000 mLs by mouth once. 4000 mL 0  . Probiotic Product (ALIGN PO) Take 1 capsule by mouth daily.    . ranitidine (ZANTAC) 150 MG capsule Take 300 mg by mouth every evening.    . simvastatin (ZOCOR) 10 MG tablet Take  10 mg by mouth at bedtime.     . nitroGLYCERIN (NITROSTAT) 0.4 MG SL tablet Place 0.4 mg under the tongue every 5 (five) minutes as needed. For chest pains. May repeat for up to 3 doses.    Marland Kitchen ondansetron (ZOFRAN-ODT) 4 MG disintegrating tablet Take 4 mg by mouth every 8 (eight) hours as needed for nausea or vomiting.    . Tamsulosin HCl (FLOMAX) 0.4 MG CAPS Take 0.4 mg by mouth every evening.     . warfarin (COUMADIN) 2.5 MG tablet Take 2.5 mg by mouth daily. Take 1 tablet daily except 1 1/2 tablets on Wednesdays and Saturdays      No results found for this or any previous visit (from the past 48 hour(s)). No results found.  ROS  Blood pressure 139/83, pulse 73, temperature 97.6 F (36.4 C), temperature source Oral, resp. rate  18, height 5' 8"  (1.727 m), weight 156 lb (70.761 kg), SpO2 99 %. Physical Exam  Constitutional: He appears well-developed and well-nourished.  HENT:  Mouth/Throat: Oropharynx is clear and moist.  Eyes: Conjunctivae are normal. No scleral icterus.  Neck: No thyromegaly present.  Cardiovascular:  Irregular rhythm normal S1 and S2. No murmur or gallop noted.  Respiratory: Effort normal and breath sounds normal.  GI:  Abdomen is full but soft with mild tenderness at iron daily. No organomegaly or masses.  Musculoskeletal: He exhibits no edema.  Lymphadenopathy:    He has no cervical adenopathy.  Neurological: He is alert.  Skin: Skin is warm and dry.     Assessment/Plan Heme positive stool. Family history of CRC in first-degree relative. Diagnostic colonoscopy.  Hildred Laser, MD 03/23/2016, 1:03 PM

## 2016-03-24 ENCOUNTER — Encounter (INDEPENDENT_AMBULATORY_CARE_PROVIDER_SITE_OTHER): Payer: Self-pay | Admitting: *Deleted

## 2016-03-28 ENCOUNTER — Ambulatory Visit (INDEPENDENT_AMBULATORY_CARE_PROVIDER_SITE_OTHER): Payer: PPO | Admitting: *Deleted

## 2016-03-28 DIAGNOSIS — I4891 Unspecified atrial fibrillation: Secondary | ICD-10-CM | POA: Diagnosis not present

## 2016-03-28 DIAGNOSIS — Z5181 Encounter for therapeutic drug level monitoring: Secondary | ICD-10-CM

## 2016-03-28 DIAGNOSIS — Z7901 Long term (current) use of anticoagulants: Secondary | ICD-10-CM | POA: Diagnosis not present

## 2016-03-28 LAB — POCT INR: INR: 1.6

## 2016-03-29 ENCOUNTER — Encounter (HOSPITAL_COMMUNITY): Payer: Self-pay | Admitting: Internal Medicine

## 2016-04-13 ENCOUNTER — Ambulatory Visit (INDEPENDENT_AMBULATORY_CARE_PROVIDER_SITE_OTHER): Payer: PPO | Admitting: *Deleted

## 2016-04-13 DIAGNOSIS — I4891 Unspecified atrial fibrillation: Secondary | ICD-10-CM

## 2016-04-13 DIAGNOSIS — Z7901 Long term (current) use of anticoagulants: Secondary | ICD-10-CM | POA: Diagnosis not present

## 2016-04-13 DIAGNOSIS — Z5181 Encounter for therapeutic drug level monitoring: Secondary | ICD-10-CM

## 2016-04-13 LAB — POCT INR: INR: 2.9

## 2016-05-11 ENCOUNTER — Ambulatory Visit (INDEPENDENT_AMBULATORY_CARE_PROVIDER_SITE_OTHER): Payer: PPO | Admitting: *Deleted

## 2016-05-11 DIAGNOSIS — Z5181 Encounter for therapeutic drug level monitoring: Secondary | ICD-10-CM | POA: Diagnosis not present

## 2016-05-11 DIAGNOSIS — Z7901 Long term (current) use of anticoagulants: Secondary | ICD-10-CM | POA: Diagnosis not present

## 2016-05-11 DIAGNOSIS — I4891 Unspecified atrial fibrillation: Secondary | ICD-10-CM | POA: Diagnosis not present

## 2016-05-11 LAB — POCT INR: INR: 2.5

## 2016-05-17 DIAGNOSIS — K210 Gastro-esophageal reflux disease with esophagitis: Secondary | ICD-10-CM | POA: Diagnosis not present

## 2016-05-17 DIAGNOSIS — Z6824 Body mass index (BMI) 24.0-24.9, adult: Secondary | ICD-10-CM | POA: Diagnosis not present

## 2016-06-01 ENCOUNTER — Other Ambulatory Visit: Payer: Self-pay | Admitting: Cardiovascular Disease

## 2016-06-08 ENCOUNTER — Encounter: Payer: Self-pay | Admitting: *Deleted

## 2016-06-08 ENCOUNTER — Ambulatory Visit (INDEPENDENT_AMBULATORY_CARE_PROVIDER_SITE_OTHER): Payer: PPO | Admitting: *Deleted

## 2016-06-08 DIAGNOSIS — I4891 Unspecified atrial fibrillation: Secondary | ICD-10-CM | POA: Diagnosis not present

## 2016-06-08 DIAGNOSIS — Z5181 Encounter for therapeutic drug level monitoring: Secondary | ICD-10-CM

## 2016-06-08 DIAGNOSIS — Z7901 Long term (current) use of anticoagulants: Secondary | ICD-10-CM

## 2016-06-08 LAB — POCT INR: INR: 2.1

## 2016-06-15 DIAGNOSIS — I10 Essential (primary) hypertension: Secondary | ICD-10-CM | POA: Diagnosis not present

## 2016-06-15 DIAGNOSIS — K529 Noninfective gastroenteritis and colitis, unspecified: Secondary | ICD-10-CM | POA: Diagnosis not present

## 2016-06-15 DIAGNOSIS — Z79899 Other long term (current) drug therapy: Secondary | ICD-10-CM | POA: Diagnosis not present

## 2016-06-15 DIAGNOSIS — Z7901 Long term (current) use of anticoagulants: Secondary | ICD-10-CM | POA: Diagnosis not present

## 2016-06-15 DIAGNOSIS — R1032 Left lower quadrant pain: Secondary | ICD-10-CM | POA: Diagnosis not present

## 2016-06-15 DIAGNOSIS — A0839 Other viral enteritis: Secondary | ICD-10-CM | POA: Diagnosis not present

## 2016-06-15 DIAGNOSIS — R1030 Lower abdominal pain, unspecified: Secondary | ICD-10-CM | POA: Diagnosis not present

## 2016-06-15 DIAGNOSIS — I2510 Atherosclerotic heart disease of native coronary artery without angina pectoris: Secondary | ICD-10-CM | POA: Diagnosis not present

## 2016-06-15 DIAGNOSIS — R1110 Vomiting, unspecified: Secondary | ICD-10-CM | POA: Diagnosis not present

## 2016-06-15 DIAGNOSIS — R1031 Right lower quadrant pain: Secondary | ICD-10-CM | POA: Diagnosis not present

## 2016-06-15 DIAGNOSIS — I482 Chronic atrial fibrillation: Secondary | ICD-10-CM | POA: Diagnosis not present

## 2016-06-15 DIAGNOSIS — R109 Unspecified abdominal pain: Secondary | ICD-10-CM | POA: Diagnosis not present

## 2016-06-15 DIAGNOSIS — I959 Hypotension, unspecified: Secondary | ICD-10-CM | POA: Diagnosis not present

## 2016-06-15 DIAGNOSIS — E039 Hypothyroidism, unspecified: Secondary | ICD-10-CM | POA: Diagnosis not present

## 2016-06-15 DIAGNOSIS — K219 Gastro-esophageal reflux disease without esophagitis: Secondary | ICD-10-CM | POA: Diagnosis not present

## 2016-06-15 DIAGNOSIS — E869 Volume depletion, unspecified: Secondary | ICD-10-CM | POA: Diagnosis not present

## 2016-06-16 DIAGNOSIS — R109 Unspecified abdominal pain: Secondary | ICD-10-CM | POA: Diagnosis not present

## 2016-06-16 DIAGNOSIS — K2970 Gastritis, unspecified, without bleeding: Secondary | ICD-10-CM | POA: Diagnosis not present

## 2016-06-18 DIAGNOSIS — R8279 Other abnormal findings on microbiological examination of urine: Secondary | ICD-10-CM | POA: Diagnosis not present

## 2016-06-18 DIAGNOSIS — R112 Nausea with vomiting, unspecified: Secondary | ICD-10-CM | POA: Diagnosis not present

## 2016-06-18 DIAGNOSIS — K219 Gastro-esophageal reflux disease without esophagitis: Secondary | ICD-10-CM | POA: Diagnosis not present

## 2016-06-18 DIAGNOSIS — I2510 Atherosclerotic heart disease of native coronary artery without angina pectoris: Secondary | ICD-10-CM | POA: Diagnosis not present

## 2016-06-18 DIAGNOSIS — R1084 Generalized abdominal pain: Secondary | ICD-10-CM | POA: Diagnosis not present

## 2016-06-18 DIAGNOSIS — Z79899 Other long term (current) drug therapy: Secondary | ICD-10-CM | POA: Diagnosis not present

## 2016-06-18 DIAGNOSIS — Z7901 Long term (current) use of anticoagulants: Secondary | ICD-10-CM | POA: Diagnosis not present

## 2016-06-18 DIAGNOSIS — E860 Dehydration: Secondary | ICD-10-CM | POA: Diagnosis not present

## 2016-06-18 DIAGNOSIS — R197 Diarrhea, unspecified: Secondary | ICD-10-CM | POA: Diagnosis not present

## 2016-06-18 DIAGNOSIS — E039 Hypothyroidism, unspecified: Secondary | ICD-10-CM | POA: Diagnosis not present

## 2016-06-18 DIAGNOSIS — R1110 Vomiting, unspecified: Secondary | ICD-10-CM | POA: Diagnosis not present

## 2016-06-18 DIAGNOSIS — I10 Essential (primary) hypertension: Secondary | ICD-10-CM | POA: Diagnosis not present

## 2016-06-18 DIAGNOSIS — N400 Enlarged prostate without lower urinary tract symptoms: Secondary | ICD-10-CM | POA: Diagnosis not present

## 2016-06-19 DIAGNOSIS — R112 Nausea with vomiting, unspecified: Secondary | ICD-10-CM | POA: Diagnosis not present

## 2016-07-19 DIAGNOSIS — Z6823 Body mass index (BMI) 23.0-23.9, adult: Secondary | ICD-10-CM | POA: Diagnosis not present

## 2016-07-19 DIAGNOSIS — A084 Viral intestinal infection, unspecified: Secondary | ICD-10-CM | POA: Diagnosis not present

## 2016-07-19 DIAGNOSIS — N3000 Acute cystitis without hematuria: Secondary | ICD-10-CM | POA: Diagnosis not present

## 2016-07-20 ENCOUNTER — Ambulatory Visit (INDEPENDENT_AMBULATORY_CARE_PROVIDER_SITE_OTHER): Payer: PPO | Admitting: *Deleted

## 2016-07-20 DIAGNOSIS — Z7901 Long term (current) use of anticoagulants: Secondary | ICD-10-CM | POA: Diagnosis not present

## 2016-07-20 DIAGNOSIS — I4891 Unspecified atrial fibrillation: Secondary | ICD-10-CM | POA: Diagnosis not present

## 2016-07-20 DIAGNOSIS — Z5181 Encounter for therapeutic drug level monitoring: Secondary | ICD-10-CM | POA: Diagnosis not present

## 2016-07-20 LAB — POCT INR: INR: 2.2

## 2016-07-27 DIAGNOSIS — H02054 Trichiasis without entropian left upper eyelid: Secondary | ICD-10-CM | POA: Diagnosis not present

## 2016-07-27 DIAGNOSIS — S0502XA Injury of conjunctiva and corneal abrasion without foreign body, left eye, initial encounter: Secondary | ICD-10-CM | POA: Diagnosis not present

## 2016-07-31 DIAGNOSIS — H02055 Trichiasis without entropian left lower eyelid: Secondary | ICD-10-CM | POA: Diagnosis not present

## 2016-08-01 DIAGNOSIS — E039 Hypothyroidism, unspecified: Secondary | ICD-10-CM | POA: Diagnosis not present

## 2016-08-01 DIAGNOSIS — E7800 Pure hypercholesterolemia, unspecified: Secondary | ICD-10-CM | POA: Diagnosis not present

## 2016-08-01 DIAGNOSIS — R7301 Impaired fasting glucose: Secondary | ICD-10-CM | POA: Diagnosis not present

## 2016-08-01 DIAGNOSIS — I10 Essential (primary) hypertension: Secondary | ICD-10-CM | POA: Diagnosis not present

## 2016-08-01 DIAGNOSIS — N183 Chronic kidney disease, stage 3 (moderate): Secondary | ICD-10-CM | POA: Diagnosis not present

## 2016-08-04 DIAGNOSIS — I10 Essential (primary) hypertension: Secondary | ICD-10-CM | POA: Diagnosis not present

## 2016-08-04 DIAGNOSIS — E7800 Pure hypercholesterolemia, unspecified: Secondary | ICD-10-CM | POA: Diagnosis not present

## 2016-08-04 DIAGNOSIS — N183 Chronic kidney disease, stage 3 (moderate): Secondary | ICD-10-CM | POA: Diagnosis not present

## 2016-08-04 DIAGNOSIS — Z23 Encounter for immunization: Secondary | ICD-10-CM | POA: Diagnosis not present

## 2016-08-04 DIAGNOSIS — E039 Hypothyroidism, unspecified: Secondary | ICD-10-CM | POA: Diagnosis not present

## 2016-08-04 DIAGNOSIS — R7301 Impaired fasting glucose: Secondary | ICD-10-CM | POA: Diagnosis not present

## 2016-08-04 DIAGNOSIS — K210 Gastro-esophageal reflux disease with esophagitis: Secondary | ICD-10-CM | POA: Diagnosis not present

## 2016-08-04 DIAGNOSIS — I482 Chronic atrial fibrillation: Secondary | ICD-10-CM | POA: Diagnosis not present

## 2016-08-17 DIAGNOSIS — Z79899 Other long term (current) drug therapy: Secondary | ICD-10-CM | POA: Diagnosis not present

## 2016-08-17 DIAGNOSIS — Z7901 Long term (current) use of anticoagulants: Secondary | ICD-10-CM | POA: Diagnosis not present

## 2016-08-17 DIAGNOSIS — E039 Hypothyroidism, unspecified: Secondary | ICD-10-CM | POA: Diagnosis not present

## 2016-08-17 DIAGNOSIS — K529 Noninfective gastroenteritis and colitis, unspecified: Secondary | ICD-10-CM | POA: Diagnosis not present

## 2016-08-17 DIAGNOSIS — I4891 Unspecified atrial fibrillation: Secondary | ICD-10-CM | POA: Diagnosis not present

## 2016-08-17 DIAGNOSIS — R1084 Generalized abdominal pain: Secondary | ICD-10-CM | POA: Diagnosis not present

## 2016-08-17 DIAGNOSIS — K219 Gastro-esophageal reflux disease without esophagitis: Secondary | ICD-10-CM | POA: Diagnosis not present

## 2016-08-22 DIAGNOSIS — S39012A Strain of muscle, fascia and tendon of lower back, initial encounter: Secondary | ICD-10-CM | POA: Diagnosis not present

## 2016-08-22 DIAGNOSIS — R112 Nausea with vomiting, unspecified: Secondary | ICD-10-CM | POA: Diagnosis not present

## 2016-08-22 DIAGNOSIS — Z6823 Body mass index (BMI) 23.0-23.9, adult: Secondary | ICD-10-CM | POA: Diagnosis not present

## 2016-08-31 ENCOUNTER — Ambulatory Visit (INDEPENDENT_AMBULATORY_CARE_PROVIDER_SITE_OTHER): Payer: PPO | Admitting: *Deleted

## 2016-08-31 DIAGNOSIS — I4891 Unspecified atrial fibrillation: Secondary | ICD-10-CM | POA: Diagnosis not present

## 2016-08-31 DIAGNOSIS — Z7901 Long term (current) use of anticoagulants: Secondary | ICD-10-CM

## 2016-08-31 DIAGNOSIS — Z5181 Encounter for therapeutic drug level monitoring: Secondary | ICD-10-CM

## 2016-08-31 LAB — POCT INR: INR: 3.2

## 2016-09-28 ENCOUNTER — Ambulatory Visit (INDEPENDENT_AMBULATORY_CARE_PROVIDER_SITE_OTHER): Payer: PPO | Admitting: *Deleted

## 2016-09-28 DIAGNOSIS — Z7901 Long term (current) use of anticoagulants: Secondary | ICD-10-CM | POA: Diagnosis not present

## 2016-09-28 DIAGNOSIS — I4891 Unspecified atrial fibrillation: Secondary | ICD-10-CM | POA: Diagnosis not present

## 2016-09-28 DIAGNOSIS — Z5181 Encounter for therapeutic drug level monitoring: Secondary | ICD-10-CM | POA: Diagnosis not present

## 2016-09-28 LAB — POCT INR: INR: 2.8

## 2016-10-24 DIAGNOSIS — L570 Actinic keratosis: Secondary | ICD-10-CM | POA: Diagnosis not present

## 2016-10-26 ENCOUNTER — Ambulatory Visit (INDEPENDENT_AMBULATORY_CARE_PROVIDER_SITE_OTHER): Payer: PPO | Admitting: *Deleted

## 2016-10-26 DIAGNOSIS — I4891 Unspecified atrial fibrillation: Secondary | ICD-10-CM

## 2016-10-26 DIAGNOSIS — Z5181 Encounter for therapeutic drug level monitoring: Secondary | ICD-10-CM | POA: Diagnosis not present

## 2016-10-26 DIAGNOSIS — Z7901 Long term (current) use of anticoagulants: Secondary | ICD-10-CM | POA: Diagnosis not present

## 2016-10-26 LAB — POCT INR: INR: 2.3

## 2016-11-22 DIAGNOSIS — R112 Nausea with vomiting, unspecified: Secondary | ICD-10-CM | POA: Diagnosis not present

## 2016-11-22 DIAGNOSIS — E039 Hypothyroidism, unspecified: Secondary | ICD-10-CM | POA: Diagnosis not present

## 2016-11-22 DIAGNOSIS — Z79899 Other long term (current) drug therapy: Secondary | ICD-10-CM | POA: Diagnosis not present

## 2016-11-22 DIAGNOSIS — R0789 Other chest pain: Secondary | ICD-10-CM | POA: Diagnosis not present

## 2016-11-22 DIAGNOSIS — I10 Essential (primary) hypertension: Secondary | ICD-10-CM | POA: Diagnosis not present

## 2016-11-22 DIAGNOSIS — Z7901 Long term (current) use of anticoagulants: Secondary | ICD-10-CM | POA: Diagnosis not present

## 2016-11-22 DIAGNOSIS — N400 Enlarged prostate without lower urinary tract symptoms: Secondary | ICD-10-CM | POA: Diagnosis not present

## 2016-11-22 DIAGNOSIS — I482 Chronic atrial fibrillation: Secondary | ICD-10-CM | POA: Diagnosis not present

## 2016-11-22 DIAGNOSIS — R110 Nausea: Secondary | ICD-10-CM | POA: Diagnosis not present

## 2016-11-22 DIAGNOSIS — Z9049 Acquired absence of other specified parts of digestive tract: Secondary | ICD-10-CM | POA: Diagnosis not present

## 2016-11-22 DIAGNOSIS — R0602 Shortness of breath: Secondary | ICD-10-CM | POA: Diagnosis not present

## 2016-11-22 DIAGNOSIS — K529 Noninfective gastroenteritis and colitis, unspecified: Secondary | ICD-10-CM | POA: Diagnosis not present

## 2016-11-22 DIAGNOSIS — I2510 Atherosclerotic heart disease of native coronary artery without angina pectoris: Secondary | ICD-10-CM | POA: Diagnosis not present

## 2016-11-22 DIAGNOSIS — K219 Gastro-esophageal reflux disease without esophagitis: Secondary | ICD-10-CM | POA: Diagnosis not present

## 2016-11-22 DIAGNOSIS — E785 Hyperlipidemia, unspecified: Secondary | ICD-10-CM | POA: Diagnosis not present

## 2016-11-23 DIAGNOSIS — R0789 Other chest pain: Secondary | ICD-10-CM | POA: Diagnosis not present

## 2016-11-23 DIAGNOSIS — R0602 Shortness of breath: Secondary | ICD-10-CM | POA: Diagnosis not present

## 2016-11-23 DIAGNOSIS — K529 Noninfective gastroenteritis and colitis, unspecified: Secondary | ICD-10-CM | POA: Diagnosis not present

## 2016-11-30 DIAGNOSIS — Z6824 Body mass index (BMI) 24.0-24.9, adult: Secondary | ICD-10-CM | POA: Diagnosis not present

## 2016-11-30 DIAGNOSIS — J209 Acute bronchitis, unspecified: Secondary | ICD-10-CM | POA: Diagnosis not present

## 2016-11-30 DIAGNOSIS — J069 Acute upper respiratory infection, unspecified: Secondary | ICD-10-CM | POA: Diagnosis not present

## 2016-11-30 DIAGNOSIS — R05 Cough: Secondary | ICD-10-CM | POA: Diagnosis not present

## 2016-12-04 DIAGNOSIS — Z6823 Body mass index (BMI) 23.0-23.9, adult: Secondary | ICD-10-CM | POA: Diagnosis not present

## 2016-12-04 DIAGNOSIS — R05 Cough: Secondary | ICD-10-CM | POA: Diagnosis not present

## 2016-12-04 DIAGNOSIS — J209 Acute bronchitis, unspecified: Secondary | ICD-10-CM | POA: Diagnosis not present

## 2016-12-11 DIAGNOSIS — R05 Cough: Secondary | ICD-10-CM | POA: Diagnosis not present

## 2016-12-12 ENCOUNTER — Ambulatory Visit (INDEPENDENT_AMBULATORY_CARE_PROVIDER_SITE_OTHER): Payer: PPO | Admitting: *Deleted

## 2016-12-12 DIAGNOSIS — Z7901 Long term (current) use of anticoagulants: Secondary | ICD-10-CM | POA: Diagnosis not present

## 2016-12-12 DIAGNOSIS — Z5181 Encounter for therapeutic drug level monitoring: Secondary | ICD-10-CM

## 2016-12-12 DIAGNOSIS — I4891 Unspecified atrial fibrillation: Secondary | ICD-10-CM

## 2016-12-12 LAB — POCT INR: INR: 5.6

## 2016-12-15 DIAGNOSIS — Z7901 Long term (current) use of anticoagulants: Secondary | ICD-10-CM | POA: Diagnosis not present

## 2016-12-15 DIAGNOSIS — Z79899 Other long term (current) drug therapy: Secondary | ICD-10-CM | POA: Diagnosis not present

## 2016-12-15 DIAGNOSIS — R197 Diarrhea, unspecified: Secondary | ICD-10-CM | POA: Diagnosis not present

## 2016-12-15 DIAGNOSIS — I4891 Unspecified atrial fibrillation: Secondary | ICD-10-CM | POA: Diagnosis not present

## 2016-12-15 DIAGNOSIS — R05 Cough: Secondary | ICD-10-CM | POA: Diagnosis not present

## 2016-12-15 DIAGNOSIS — K922 Gastrointestinal hemorrhage, unspecified: Secondary | ICD-10-CM | POA: Diagnosis not present

## 2016-12-15 DIAGNOSIS — N400 Enlarged prostate without lower urinary tract symptoms: Secondary | ICD-10-CM | POA: Diagnosis not present

## 2016-12-15 DIAGNOSIS — E039 Hypothyroidism, unspecified: Secondary | ICD-10-CM | POA: Diagnosis not present

## 2016-12-15 DIAGNOSIS — K219 Gastro-esophageal reflux disease without esophagitis: Secondary | ICD-10-CM | POA: Diagnosis not present

## 2016-12-19 ENCOUNTER — Ambulatory Visit (INDEPENDENT_AMBULATORY_CARE_PROVIDER_SITE_OTHER): Payer: PPO | Admitting: *Deleted

## 2016-12-19 DIAGNOSIS — Z5181 Encounter for therapeutic drug level monitoring: Secondary | ICD-10-CM

## 2016-12-19 DIAGNOSIS — Z7901 Long term (current) use of anticoagulants: Secondary | ICD-10-CM

## 2016-12-19 DIAGNOSIS — I4891 Unspecified atrial fibrillation: Secondary | ICD-10-CM | POA: Diagnosis not present

## 2016-12-19 LAB — POCT INR: INR: 1.2

## 2016-12-28 ENCOUNTER — Ambulatory Visit (INDEPENDENT_AMBULATORY_CARE_PROVIDER_SITE_OTHER): Payer: PPO | Admitting: *Deleted

## 2016-12-28 DIAGNOSIS — Z7901 Long term (current) use of anticoagulants: Secondary | ICD-10-CM

## 2016-12-28 DIAGNOSIS — Z5181 Encounter for therapeutic drug level monitoring: Secondary | ICD-10-CM | POA: Diagnosis not present

## 2016-12-28 DIAGNOSIS — I4891 Unspecified atrial fibrillation: Secondary | ICD-10-CM

## 2016-12-28 LAB — POCT INR: INR: 2.4

## 2017-01-01 ENCOUNTER — Other Ambulatory Visit: Payer: Self-pay | Admitting: Cardiovascular Disease

## 2017-01-18 ENCOUNTER — Ambulatory Visit (INDEPENDENT_AMBULATORY_CARE_PROVIDER_SITE_OTHER): Payer: PPO | Admitting: *Deleted

## 2017-01-18 DIAGNOSIS — Z7901 Long term (current) use of anticoagulants: Secondary | ICD-10-CM

## 2017-01-18 DIAGNOSIS — I4891 Unspecified atrial fibrillation: Secondary | ICD-10-CM

## 2017-01-18 DIAGNOSIS — Z5181 Encounter for therapeutic drug level monitoring: Secondary | ICD-10-CM | POA: Diagnosis not present

## 2017-01-18 LAB — POCT INR: INR: 2.9

## 2017-01-30 DIAGNOSIS — Z6823 Body mass index (BMI) 23.0-23.9, adult: Secondary | ICD-10-CM | POA: Diagnosis not present

## 2017-01-30 DIAGNOSIS — A630 Anogenital (venereal) warts: Secondary | ICD-10-CM | POA: Diagnosis not present

## 2017-01-31 ENCOUNTER — Ambulatory Visit (INDEPENDENT_AMBULATORY_CARE_PROVIDER_SITE_OTHER): Payer: PPO | Admitting: Cardiovascular Disease

## 2017-01-31 ENCOUNTER — Encounter: Payer: Self-pay | Admitting: Cardiovascular Disease

## 2017-01-31 VITALS — BP 120/80 | HR 108 | Ht 68.0 in | Wt 157.0 lb

## 2017-01-31 DIAGNOSIS — Z7901 Long term (current) use of anticoagulants: Secondary | ICD-10-CM

## 2017-01-31 DIAGNOSIS — I482 Chronic atrial fibrillation, unspecified: Secondary | ICD-10-CM

## 2017-01-31 DIAGNOSIS — I2510 Atherosclerotic heart disease of native coronary artery without angina pectoris: Secondary | ICD-10-CM | POA: Diagnosis not present

## 2017-01-31 NOTE — Progress Notes (Signed)
SUBJECTIVE: The patient presents for follow-up of chronic atrial fibrillation and nonobstructive coronary disease.  He denies exertional chest pain. He has exertional dyspnea after he does anything strenuous. This occurred the other day when he had to move 3 trash cans outside in the cold wind.  He has some fatigue which has been stable.  He was hospitalized for bronchitis earlier this year.  ECG performed in the office today which I ordered and personally interpreted demonstrates atrial fibrillation, 92 bpm.    Soc Hx: Former M[employer] of [employer].  Review of Systems: As per "subjective", otherwise negative.  No Known Allergies  Current Outpatient Prescriptions  Medication Sig Dispense Refill  . acetaminophen (TYLENOL) 500 MG tablet Take 500 mg by mouth every 6 (six) hours as needed.    . carvedilol (COREG) 6.25 MG tablet TAKE ONE TABLET BY MOUTH TWICE DAILY. 60 tablet 6  . diltiazem (CARDIZEM CD) 180 MG 24 hr capsule Take 180 mg by mouth 2 (two) times daily.     Marland Kitchen levothyroxine (SYNTHROID, LEVOTHROID) 75 MCG tablet Take 75 mcg by mouth daily before breakfast.    . magnesium gluconate (MAGONATE) 500 MG tablet Take 500 mg by mouth daily.     . Melatonin 3 MG TABS Take by mouth at bedtime as needed.    . nitroGLYCERIN (NITROSTAT) 0.4 MG SL tablet Place 0.4 mg under the tongue every 5 (five) minutes as needed. For chest pains. May repeat for up to 3 doses.    . ranitidine (ZANTAC) 150 MG capsule Take 300 mg by mouth every evening.    . simvastatin (ZOCOR) 10 MG tablet Take 10 mg by mouth at bedtime.     . Tamsulosin HCl (FLOMAX) 0.4 MG CAPS Take 0.4 mg by mouth every evening.     . warfarin (COUMADIN) 2.5 MG tablet Take 2.5 mg by mouth daily. Take 1 tablet daily except 1 1/2 tablets on Wednesdays and Saturdays     No current facility-administered medications for this visit.     Past Medical History:  Diagnosis Date  . Atrial fibrillation ([place])    Permanent  . CAD (coronary artery disease)    Catheterization 2004, mild/moderate nonobstructive disease  /   nuclear, 2007, small inferior scar // no ischemia  . Carotid bruit    Doppler, December 23, 2010, no significant carotid disease  . Clot    LA, small, in past  . Dizziness 03/2010   June, 2011   Mild, stop digoxin, felt better  . Ejection fraction 02/2009   EF 55-60%,Echo, May, 2010  . Elevated PSA   . GERD (gastroesophageal reflux disease)   . LVH (left ventricular hypertrophy) 02/2009    Mild, echo, 2010  . Mitral regurgitation    Mild, echo, flat closure  . Nausea vomiting and diarrhea 01/2010   Hospitalization, probably viral (or partial small bowel obstruction)/ Hospital 09/2010, recurrent possible partial small bowel obstruction, medical therapy  . Other malaise and fatigue   . Warfarin anticoagulation    Atrial fibrillation    Past Surgical History:  Procedure Laterality Date  . AGILE CAPSULE  10/18/2011   Procedure: AGILE CAPSULE;  Surgeon: Rogene Houston, MD;  [facility];  Service: Endoscopy;  Laterality: N/A;  730  . CARDIAC CATHETERIZATION  2004  . CHOLECYSTECTOMY  2010   Dr. Anthony Sar  . COLONOSCOPY  2008   [facility]  . COLONOSCOPY N/A 03/23/2016   Procedure: COLONOSCOPY;  Surgeon: Mechele Dawley  Laural Golden, MD;  [facility];  Service: Endoscopy;  Laterality: N/A;  1:00  . ESOPHAGOGASTRODUODENOSCOPY  11/08/2011   Procedure: ESOPHAGOGASTRODUODENOSCOPY (EGD);  Surgeon: Rogene Houston, MD;  [facility];  Service: Endoscopy;  Laterality: N/A;  300  . POLYPECTOMY  03/23/2016   Procedure: POLYPECTOMY;  Surgeon: Rogene Houston, MD;  [facility];  Service: Endoscopy;;  Cecal polyp removed via cold forceps recto-sigmoid polyp removed via cold snare    Social History   Social History  . Marital status: Married    Spouse name: N/A  . Number of children: N/A  . Years of education: N/A   Occupational History  . Retired-Hospital  Mudlogger  Retired   Social History Main Topics  . Smoking status: Never Smoker  . Smokeless tobacco: Never Used  . Alcohol use No  . Drug use: No  . Sexual activity: Not on file   Other Topics Concern  . Not on file   Social History Narrative   Married     Vitals:   01/31/17 1456  BP: 120/80  Pulse: (!) 108  SpO2: 98%  Weight: 157 lb (71.2 kg)  Height: 5' 8"  (1.727 m)    Wt Readings from Last 3 Encounters:  01/31/17 157 lb (71.2 kg)  03/23/16 156 lb (70.8 kg)  02/22/16 162 lb 6.4 oz (73.7 kg)     PHYSICAL EXAM General: NAD HEENT: Normal. Neck: No JVD, no thyromegaly. Lungs: Clear to auscultation bilaterally with normal respiratory effort. CV: Nondisplaced PMI.  Regular rate and irregular rhythm, normal S1/S2, no S3, no murmur. No pretibial or periankle edema.  No carotid bruit.   Abdomen: Soft, nontender, no distention.  Neurologic: Alert and oriented.  Psych: Normal affect. Skin: Normal. Musculoskeletal: No gross deformities.    ECG: Most recent ECG reviewed.   Labs: Lab Results  Component Value Date/Time   CREATININE 1.27 10/23/2011 11:02 AM   HGB 15.2 04/03/2014 04:34 PM     Lipids: No results found for: LDLCALC, LDLDIRECT, CHOL, TRIG, HDL     ASSESSMENT AND PLAN: 1. Chronic atrial fibrillation: Symptomatically stable on long-acting diltiazem 180 mg twice daily. Anticoagulated with warfarin. No changes.  2. Coronary artery disease: Symptomatically stable on carvedilol and simvastatin. No changes.    Disposition: Follow up 1 yr  Kate Sable, M.D., F.A.C.C.

## 2017-01-31 NOTE — Patient Instructions (Signed)

## 2017-02-06 DIAGNOSIS — K210 Gastro-esophageal reflux disease with esophagitis: Secondary | ICD-10-CM | POA: Diagnosis not present

## 2017-02-06 DIAGNOSIS — N183 Chronic kidney disease, stage 3 (moderate): Secondary | ICD-10-CM | POA: Diagnosis not present

## 2017-02-06 DIAGNOSIS — I10 Essential (primary) hypertension: Secondary | ICD-10-CM | POA: Diagnosis not present

## 2017-02-06 DIAGNOSIS — Z0000 Encounter for general adult medical examination without abnormal findings: Secondary | ICD-10-CM | POA: Diagnosis not present

## 2017-02-06 DIAGNOSIS — I482 Chronic atrial fibrillation: Secondary | ICD-10-CM | POA: Diagnosis not present

## 2017-02-06 DIAGNOSIS — E039 Hypothyroidism, unspecified: Secondary | ICD-10-CM | POA: Diagnosis not present

## 2017-02-06 DIAGNOSIS — E7800 Pure hypercholesterolemia, unspecified: Secondary | ICD-10-CM | POA: Diagnosis not present

## 2017-02-08 DIAGNOSIS — A630 Anogenital (venereal) warts: Secondary | ICD-10-CM | POA: Diagnosis not present

## 2017-02-08 DIAGNOSIS — Z0001 Encounter for general adult medical examination with abnormal findings: Secondary | ICD-10-CM | POA: Diagnosis not present

## 2017-02-08 DIAGNOSIS — I2510 Atherosclerotic heart disease of native coronary artery without angina pectoris: Secondary | ICD-10-CM | POA: Diagnosis not present

## 2017-02-08 DIAGNOSIS — I10 Essential (primary) hypertension: Secondary | ICD-10-CM | POA: Diagnosis not present

## 2017-02-08 DIAGNOSIS — I482 Chronic atrial fibrillation: Secondary | ICD-10-CM | POA: Diagnosis not present

## 2017-02-08 DIAGNOSIS — E7800 Pure hypercholesterolemia, unspecified: Secondary | ICD-10-CM | POA: Diagnosis not present

## 2017-02-08 DIAGNOSIS — L859 Epidermal thickening, unspecified: Secondary | ICD-10-CM | POA: Diagnosis not present

## 2017-02-08 DIAGNOSIS — E039 Hypothyroidism, unspecified: Secondary | ICD-10-CM | POA: Diagnosis not present

## 2017-02-15 ENCOUNTER — Ambulatory Visit (INDEPENDENT_AMBULATORY_CARE_PROVIDER_SITE_OTHER): Payer: PPO | Admitting: *Deleted

## 2017-02-15 DIAGNOSIS — I4891 Unspecified atrial fibrillation: Secondary | ICD-10-CM

## 2017-02-15 DIAGNOSIS — Z7901 Long term (current) use of anticoagulants: Secondary | ICD-10-CM

## 2017-02-15 DIAGNOSIS — Z5181 Encounter for therapeutic drug level monitoring: Secondary | ICD-10-CM

## 2017-02-15 LAB — POCT INR: INR: 2

## 2017-03-27 ENCOUNTER — Ambulatory Visit (INDEPENDENT_AMBULATORY_CARE_PROVIDER_SITE_OTHER): Payer: PPO | Admitting: *Deleted

## 2017-03-27 DIAGNOSIS — Z7901 Long term (current) use of anticoagulants: Secondary | ICD-10-CM | POA: Diagnosis not present

## 2017-03-27 DIAGNOSIS — I4891 Unspecified atrial fibrillation: Secondary | ICD-10-CM | POA: Diagnosis not present

## 2017-03-27 DIAGNOSIS — Z5181 Encounter for therapeutic drug level monitoring: Secondary | ICD-10-CM

## 2017-03-27 LAB — POCT INR: INR: 2.6

## 2017-04-23 DIAGNOSIS — L570 Actinic keratosis: Secondary | ICD-10-CM | POA: Diagnosis not present

## 2017-04-25 DIAGNOSIS — Z6824 Body mass index (BMI) 24.0-24.9, adult: Secondary | ICD-10-CM | POA: Diagnosis not present

## 2017-04-25 DIAGNOSIS — H6122 Impacted cerumen, left ear: Secondary | ICD-10-CM | POA: Diagnosis not present

## 2017-04-25 DIAGNOSIS — S63502A Unspecified sprain of left wrist, initial encounter: Secondary | ICD-10-CM | POA: Diagnosis not present

## 2017-04-28 DIAGNOSIS — H6122 Impacted cerumen, left ear: Secondary | ICD-10-CM | POA: Diagnosis not present

## 2017-05-08 ENCOUNTER — Ambulatory Visit (INDEPENDENT_AMBULATORY_CARE_PROVIDER_SITE_OTHER): Payer: PPO | Admitting: *Deleted

## 2017-05-08 DIAGNOSIS — Z5181 Encounter for therapeutic drug level monitoring: Secondary | ICD-10-CM | POA: Diagnosis not present

## 2017-05-08 DIAGNOSIS — Z7901 Long term (current) use of anticoagulants: Secondary | ICD-10-CM | POA: Diagnosis not present

## 2017-05-08 DIAGNOSIS — I4891 Unspecified atrial fibrillation: Secondary | ICD-10-CM | POA: Diagnosis not present

## 2017-05-08 LAB — POCT INR: INR: 2.7

## 2017-05-30 DIAGNOSIS — E7800 Pure hypercholesterolemia, unspecified: Secondary | ICD-10-CM | POA: Diagnosis not present

## 2017-05-30 DIAGNOSIS — R7301 Impaired fasting glucose: Secondary | ICD-10-CM | POA: Diagnosis not present

## 2017-05-30 DIAGNOSIS — I482 Chronic atrial fibrillation: Secondary | ICD-10-CM | POA: Diagnosis not present

## 2017-05-30 DIAGNOSIS — I10 Essential (primary) hypertension: Secondary | ICD-10-CM | POA: Diagnosis not present

## 2017-06-06 DIAGNOSIS — E039 Hypothyroidism, unspecified: Secondary | ICD-10-CM | POA: Diagnosis not present

## 2017-06-06 DIAGNOSIS — I482 Chronic atrial fibrillation: Secondary | ICD-10-CM | POA: Diagnosis not present

## 2017-06-06 DIAGNOSIS — N183 Chronic kidney disease, stage 3 (moderate): Secondary | ICD-10-CM | POA: Diagnosis not present

## 2017-06-06 DIAGNOSIS — R7301 Impaired fasting glucose: Secondary | ICD-10-CM | POA: Diagnosis not present

## 2017-06-06 DIAGNOSIS — K210 Gastro-esophageal reflux disease with esophagitis: Secondary | ICD-10-CM | POA: Diagnosis not present

## 2017-06-06 DIAGNOSIS — I10 Essential (primary) hypertension: Secondary | ICD-10-CM | POA: Diagnosis not present

## 2017-06-06 DIAGNOSIS — Z6823 Body mass index (BMI) 23.0-23.9, adult: Secondary | ICD-10-CM | POA: Diagnosis not present

## 2017-06-06 DIAGNOSIS — E7800 Pure hypercholesterolemia, unspecified: Secondary | ICD-10-CM | POA: Diagnosis not present

## 2017-06-11 ENCOUNTER — Telehealth: Payer: Self-pay | Admitting: Cardiovascular Disease

## 2017-06-11 NOTE — Telephone Encounter (Signed)
Called pt who rates chest pressure 5/10. He reports SOB when walking and denies chest pressure at SOB at rest. Pt reports having indigestion. Please advise.

## 2017-06-11 NOTE — Telephone Encounter (Signed)
[name] called the office back stating that noone had called him. We had a cancellation for Tuesday am. I gave patient the appointment.

## 2017-06-11 NOTE — Telephone Encounter (Signed)
Mr. [name] called stating that he has been having some tightness around his heart for several days now.  No shortness of breath or chest pains.

## 2017-06-12 ENCOUNTER — Encounter: Payer: Self-pay | Admitting: Cardiovascular Disease

## 2017-06-12 ENCOUNTER — Ambulatory Visit (INDEPENDENT_AMBULATORY_CARE_PROVIDER_SITE_OTHER): Payer: PPO | Admitting: Cardiovascular Disease

## 2017-06-12 VITALS — BP 117/74 | HR 83 | Ht 68.0 in | Wt 160.0 lb

## 2017-06-12 DIAGNOSIS — I482 Chronic atrial fibrillation: Secondary | ICD-10-CM

## 2017-06-12 DIAGNOSIS — R109 Unspecified abdominal pain: Secondary | ICD-10-CM

## 2017-06-12 DIAGNOSIS — R0789 Other chest pain: Secondary | ICD-10-CM | POA: Diagnosis not present

## 2017-06-12 DIAGNOSIS — Z7901 Long term (current) use of anticoagulants: Secondary | ICD-10-CM | POA: Diagnosis not present

## 2017-06-12 DIAGNOSIS — I4821 Permanent atrial fibrillation: Secondary | ICD-10-CM

## 2017-06-12 DIAGNOSIS — K219 Gastro-esophageal reflux disease without esophagitis: Secondary | ICD-10-CM

## 2017-06-12 DIAGNOSIS — I2510 Atherosclerotic heart disease of native coronary artery without angina pectoris: Secondary | ICD-10-CM | POA: Diagnosis not present

## 2017-06-12 MED ORDER — PANTOPRAZOLE SODIUM 40 MG PO TBEC: 40.0000 mg | DELAYED_RELEASE_TABLET | Freq: Every day | ORAL | 6 refills | Status: DC

## 2017-06-12 NOTE — Telephone Encounter (Signed)
Patient seen in office today. Dr. Bronson Ing addressed

## 2017-06-12 NOTE — Patient Instructions (Signed)
Medication Instructions:   Stop Zantac.  Begin Protonix 60m daily.   Continue all other medications.    Labwork:  Magnesium, CBC, LFT - orders given today.  Do this Friday, 06/15/2017.    Office will contact with results via phone or letter.    Testing/Procedures: none  Follow-Up: 6 weeks   Any Other Special Instructions Will Be Listed Below (If Applicable).  If you need a refill on your cardiac medications before your next appointment, please call your pharmacy.

## 2017-06-12 NOTE — Progress Notes (Signed)
SUBJECTIVE: The patient presents for premature follow-up. I saw him on 01/31/17 and he was doing well at that time. He has chronic atrial fibrillation and nonobstructive coronary artery disease.  He has been experiencing chest pressure since last Thursday. He was bothered by a "bad acid reflux "on Wednesday. He has had some mild shortness of breath. It is not associated with exertion. He has not been over eating. He denies lightheadedness and dizziness. He has been avoiding caffeine and fried foods.  He had been on omeprazole the past but it led to hypomagnesemia. He is now on Zantac 300 milligrams daily. He is also on magnesium supplementation.    Soc Hx: Former M[employer] of [employer].   Review of Systems: As per "subjective", otherwise negative.  No Known Allergies  Current Outpatient Prescriptions  Medication Sig Dispense Refill  . acetaminophen (TYLENOL) 500 MG tablet Take 500 mg by mouth every 6 (six) hours as needed.    . carvedilol (COREG) 6.25 MG tablet TAKE ONE TABLET BY MOUTH TWICE DAILY. 60 tablet 6  . diltiazem (CARDIZEM CD) 180 MG 24 hr capsule Take 180 mg by mouth 2 (two) times daily.     Marland Kitchen levothyroxine (SYNTHROID, LEVOTHROID) 75 MCG tablet Take 75 mcg by mouth daily before breakfast.    . magnesium gluconate (MAGONATE) 500 MG tablet Take 500 mg by mouth 2 (two) times daily.     . Melatonin 3 MG TABS Take by mouth at bedtime as needed.    . nitroGLYCERIN (NITROSTAT) 0.4 MG SL tablet Place 0.4 mg under the tongue every 5 (five) minutes as needed. For chest pains. May repeat for up to 3 doses.    . ranitidine (ZANTAC) 150 MG capsule Take 300 mg by mouth every evening.    . simvastatin (ZOCOR) 10 MG tablet Take 10 mg by mouth at bedtime.     . Tamsulosin HCl (FLOMAX) 0.4 MG CAPS Take 0.4 mg by mouth every evening.     . warfarin (COUMADIN) 2.5 MG tablet Take 2.5 mg by mouth daily. Take 1 tablet daily except 1 1/2 tablets on Wednesdays and Saturdays      No current facility-administered medications for this visit.     Past Medical History:  Diagnosis Date  . Atrial fibrillation ([place])    Permanent  . CAD (coronary artery disease)    Catheterization 2004, mild/moderate nonobstructive disease  /   nuclear, 2007, small inferior scar // no ischemia  . Carotid bruit    Doppler, December 23, 2010, no significant carotid disease  . Clot    LA, small, in past  . Dizziness 03/2010   June, 2011   Mild, stop digoxin, felt better  . Ejection fraction 02/2009   EF 55-60%,Echo, May, 2010  . Elevated PSA   . GERD (gastroesophageal reflux disease)   . LVH (left ventricular hypertrophy) 02/2009    Mild, echo, 2010  . Mitral regurgitation    Mild, echo, flat closure  . Nausea vomiting and diarrhea 01/2010   Hospitalization, probably viral (or partial small bowel obstruction)/ Hospital 09/2010, recurrent possible partial small bowel obstruction, medical therapy  . Other malaise and fatigue   . Warfarin anticoagulation    Atrial fibrillation    Past Surgical History:  Procedure Laterality Date  . AGILE CAPSULE  10/18/2011   Procedure: AGILE CAPSULE;  Surgeon: Rogene Houston, MD;  [facility];  Service: Endoscopy;  Laterality: N/A;  730  . CARDIAC CATHETERIZATION  2004  . CHOLECYSTECTOMY  2010   Dr. Anthony Sar  . COLONOSCOPY  2008   [facility]  . COLONOSCOPY N/A 03/23/2016   Procedure: COLONOSCOPY;  Surgeon: Rogene Houston, MD;  [facility];  Service: Endoscopy;  Laterality: N/A;  1:00  . ESOPHAGOGASTRODUODENOSCOPY  11/08/2011   Procedure: ESOPHAGOGASTRODUODENOSCOPY (EGD);  Surgeon: Rogene Houston, MD;  [facility];  Service: Endoscopy;  Laterality: N/A;  300  . POLYPECTOMY  03/23/2016   Procedure: POLYPECTOMY;  Surgeon: Rogene Houston, MD;  [facility];  Service: Endoscopy;;  Cecal polyp removed via cold forceps recto-sigmoid polyp removed via cold snare    Social History   Social History  .  Marital status: Married    Spouse name: N/A  . Number of children: N/A  . Years of education: N/A   Occupational History  . Retired-Hospital Mudlogger  Retired   Social History Main Topics  . Smoking status: Never Smoker  . Smokeless tobacco: Never Used  . Alcohol use No  . Drug use: No  . Sexual activity: Not on file   Other Topics Concern  . Not on file   Social History Narrative   Married     Vitals:   06/12/17 0830  BP: 117/74  Pulse: 83  Weight: 160 lb (72.6 kg)  Height: 5' 8"  (1.727 m)    Wt Readings from Last 3 Encounters:  06/12/17 160 lb (72.6 kg)  01/31/17 157 lb (71.2 kg)  03/23/16 156 lb (70.8 kg)     PHYSICAL EXAM General: NAD HEENT: Normal. Neck: No JVD, no thyromegaly. Lungs: Clear to auscultation bilaterally with normal respiratory effort. CV: Nondisplaced PMI.  Regular rate and irregular rhythm, normal S1/S2, no S3, no murmur. No pretibial or periankle edema.  No carotid bruit.   Abdomen: Soft, + epigastric tenderness, no distention.  Neurologic: Alert and oriented.  Psych: Normal affect. Skin: Normal. Musculoskeletal: No gross deformities.    ECG: Most recent ECG reviewed.   Labs: Lab Results  Component Value Date/Time   CREATININE 1.27 10/23/2011 11:02 AM   HGB 15.2 04/03/2014 04:34 PM     Lipids: No results found for: LDLCALC, LDLDIRECT, CHOL, TRIG, HDL     ASSESSMENT AND PLAN:  1. Chronic atrial fibrillation: Symptomatically stable on long-acting diltiazem 180 mg twice daily. Anticoagulated with warfarin. No changes.  2. Coronary artery disease: Symptomatically stable on carvedilol and simvastatin. No changes.  3. Chest pressure, abdominal tenderness, and GERD: He likely has gastric inflammation from suboptimally controlled gastroesophageal reflux disease. He had been on omeprazole the past but it led to significant hypomagnesemia. He is now on magnesium supplementation. I will discontinue Zantac and start Protonix 40 mg  daily. I will check magnesium levels this Friday. I will also check a CBC to evaluate for anemia and liver function tests. I will follow-up with him in 6 weeks.  4. Hypomagnesemia: On supplementation. Will check level in four days as I am starting PPI.   Disposition: Follow up 6 weeks.   Kate Sable, M.D., F.A.C.C.

## 2017-06-15 DIAGNOSIS — K219 Gastro-esophageal reflux disease without esophagitis: Secondary | ICD-10-CM | POA: Diagnosis not present

## 2017-06-15 DIAGNOSIS — R109 Unspecified abdominal pain: Secondary | ICD-10-CM | POA: Diagnosis not present

## 2017-06-15 LAB — CBC
HCT: 39.2 % (ref 38.5–50.0)
Hemoglobin: 12.8 g/dL — ABNORMAL LOW (ref 13.2–17.1)
MCH: 26.9 pg — ABNORMAL LOW (ref 27.0–33.0)
MCHC: 32.7 g/dL (ref 32.0–36.0)
MCV: 82.5 fL (ref 80.0–100.0)
MPV: 8.9 fL (ref 7.5–12.5)
Platelets: 193 K/uL (ref 140–400)
RBC: 4.75 MIL/uL (ref 4.20–5.80)
RDW: 15.3 % — ABNORMAL HIGH (ref 11.0–15.0)
WBC: 6 K/uL (ref 3.8–10.8)

## 2017-06-15 LAB — HEPATIC FUNCTION PANEL
ALBUMIN: 3.9 g/dL (ref 3.6–5.1)
ALK PHOS: 65 U/L (ref 40–115)
ALT: 10 U/L (ref 9–46)
AST: 14 U/L (ref 10–35)
Bilirubin, Direct: 0.1 mg/dL (ref <=0.2)
Indirect Bilirubin: 0.5 mg/dL (ref 0.2–1.2)
TOTAL PROTEIN: 6.2 g/dL (ref 6.1–8.1)
Total Bilirubin: 0.6 mg/dL (ref 0.2–1.2)

## 2017-06-15 LAB — MAGNESIUM: Magnesium: 1.7 mg/dL (ref 1.5–2.5)

## 2017-06-19 ENCOUNTER — Telehealth: Payer: Self-pay | Admitting: *Deleted

## 2017-06-19 NOTE — Telephone Encounter (Deleted)
-----   Message from Acquanetta Chain, LPN sent at 03/21/599  7:29 AM EDT -----   ----- Message ----- From: Herminio Commons, MD Sent: 06/18/2017   9:29 AM To: Acquanetta Chain, LPN  Ok.

## 2017-06-19 NOTE — Telephone Encounter (Signed)
Patient is questioning if he needs to have his Magnesium level checked again prior to his next OV.  Scheduled for 07-30-2017.

## 2017-06-19 NOTE — Telephone Encounter (Signed)
All labs "okay" per Dr. Bronson Ing.

## 2017-06-19 NOTE — Telephone Encounter (Signed)
I will repeat after seeing him then.

## 2017-06-19 NOTE — Telephone Encounter (Signed)
Patient notified and verbalized understanding. 

## 2017-06-19 NOTE — Telephone Encounter (Signed)
Notes recorded by Laurine Blazer, LPN on 10/22/2089 at 0:68 AM EDT Patient notified. Copy to pmd. Follow up scheduled for October. ------

## 2017-06-21 ENCOUNTER — Ambulatory Visit (INDEPENDENT_AMBULATORY_CARE_PROVIDER_SITE_OTHER): Payer: PPO | Admitting: *Deleted

## 2017-06-21 DIAGNOSIS — I4891 Unspecified atrial fibrillation: Secondary | ICD-10-CM

## 2017-06-21 DIAGNOSIS — Z7901 Long term (current) use of anticoagulants: Secondary | ICD-10-CM

## 2017-06-21 DIAGNOSIS — Z5181 Encounter for therapeutic drug level monitoring: Secondary | ICD-10-CM

## 2017-06-21 LAB — POCT INR: INR: 2.9

## 2017-07-02 DIAGNOSIS — Z23 Encounter for immunization: Secondary | ICD-10-CM | POA: Diagnosis not present

## 2017-07-03 DIAGNOSIS — H2513 Age-related nuclear cataract, bilateral: Secondary | ICD-10-CM | POA: Diagnosis not present

## 2017-07-03 DIAGNOSIS — H5213 Myopia, bilateral: Secondary | ICD-10-CM | POA: Diagnosis not present

## 2017-07-03 DIAGNOSIS — H43811 Vitreous degeneration, right eye: Secondary | ICD-10-CM | POA: Diagnosis not present

## 2017-07-03 DIAGNOSIS — H52223 Regular astigmatism, bilateral: Secondary | ICD-10-CM | POA: Diagnosis not present

## 2017-07-26 ENCOUNTER — Ambulatory Visit: Payer: PPO | Admitting: Cardiovascular Disease

## 2017-07-30 ENCOUNTER — Ambulatory Visit (INDEPENDENT_AMBULATORY_CARE_PROVIDER_SITE_OTHER): Payer: PPO | Admitting: Cardiovascular Disease

## 2017-07-30 ENCOUNTER — Encounter: Payer: Self-pay | Admitting: Cardiovascular Disease

## 2017-07-30 VITALS — BP 138/82 | HR 86 | Ht 68.0 in | Wt 157.0 lb

## 2017-07-30 DIAGNOSIS — R109 Unspecified abdominal pain: Secondary | ICD-10-CM

## 2017-07-30 DIAGNOSIS — K219 Gastro-esophageal reflux disease without esophagitis: Secondary | ICD-10-CM

## 2017-07-30 DIAGNOSIS — I25118 Atherosclerotic heart disease of native coronary artery with other forms of angina pectoris: Secondary | ICD-10-CM | POA: Diagnosis not present

## 2017-07-30 DIAGNOSIS — Z7901 Long term (current) use of anticoagulants: Secondary | ICD-10-CM

## 2017-07-30 DIAGNOSIS — I482 Chronic atrial fibrillation, unspecified: Secondary | ICD-10-CM

## 2017-07-30 DIAGNOSIS — R079 Chest pain, unspecified: Secondary | ICD-10-CM

## 2017-07-30 NOTE — Patient Instructions (Signed)
Medication Instructions:  Your physician recommends that you continue on your current medications as directed. Please refer to the Current Medication list given to you today.  Labwork: Magnesium Orders given today  Testing/Procedures: Your physician has requested that you have a lexiscan myoview. For further information please visit [email or web address]. Please follow instruction sheet, as given.  Non-Cardiac CT scanning, (CAT scanning), is a noninvasive, special x-ray that produces cross-sectional images of the body using x-rays and a computer. CT scans help physicians diagnose and treat medical conditions. For some CT exams, a contrast material is used to enhance visibility in the area of the body being studied. CT scans provide greater clarity and reveal more details than regular x-ray exams.   Follow-Up: Your physician recommends that you schedule a follow-up appointment in: [place] Bronson Ing  Any Other Special Instructions Will Be Listed Below (If Applicable).  If you need a refill on your cardiac medications before your next appointment, please call your pharmacy.

## 2017-07-30 NOTE — Progress Notes (Signed)
SUBJECTIVE: The patient presents for follow-up. He has chronic atrial fibrillation and nonobstructive coronary disease.  He also has GERD for which I prescribed Protonix at his last visit on 06/12/17. Magnesium was 1.7 on 06/15/17.  He had an episode of precordial and retrosternal chest pressure last Tuesday. He wasn't sure if it was gas related or heart related. He has had a constant sensation in his right lower abdomen. He denies pain and tenderness but senses a fullness. He does mention forceful bowel movements.  GERD symptoms have been last frequent since the institution of Protonix.     Review of Systems: As per "subjective", otherwise negative.  No Known Allergies  Current Outpatient Prescriptions  Medication Sig Dispense Refill  . acetaminophen (TYLENOL) 500 MG tablet Take 500 mg by mouth every 6 (six) hours as needed.    . carvedilol (COREG) 6.25 MG tablet TAKE ONE TABLET BY MOUTH TWICE DAILY. 60 tablet 6  . diltiazem (CARDIZEM CD) 180 MG 24 hr capsule Take 180 mg by mouth 2 (two) times daily.     Marland Kitchen levothyroxine (SYNTHROID, LEVOTHROID) 75 MCG tablet Take 75 mcg by mouth daily before breakfast.    . magnesium gluconate (MAGONATE) 500 MG tablet Take 500 mg by mouth 2 (two) times daily.     . Melatonin 3 MG TABS Take by mouth at bedtime as needed.    . nitroGLYCERIN (NITROSTAT) 0.4 MG SL tablet Place 0.4 mg under the tongue every 5 (five) minutes as needed. For chest pains. May repeat for up to 3 doses.    . pantoprazole (PROTONIX) 40 MG tablet Take 1 tablet (40 mg total) by mouth daily. 30 tablet 6  . simvastatin (ZOCOR) 10 MG tablet Take 10 mg by mouth at bedtime.     . Tamsulosin HCl (FLOMAX) 0.4 MG CAPS Take 0.4 mg by mouth every evening.     . warfarin (COUMADIN) 2.5 MG tablet Take 2.5 mg by mouth daily. Take 1 tablet daily except 1 1/2 tablets on Wednesdays and Saturdays     No current facility-administered medications for this visit.     Past Medical History:    Diagnosis Date  . Atrial fibrillation ([place])    Permanent  . CAD (coronary artery disease)    Catheterization 2004, mild/moderate nonobstructive disease  /   nuclear, 2007, small inferior scar // no ischemia  . Carotid bruit    Doppler, December 23, 2010, no significant carotid disease  . Clot    LA, small, in past  . Dizziness 03/2010   June, 2011   Mild, stop digoxin, felt better  . Ejection fraction 02/2009   EF 55-60%,Echo, May, 2010  . Elevated PSA   . GERD (gastroesophageal reflux disease)   . LVH (left ventricular hypertrophy) 02/2009    Mild, echo, 2010  . Mitral regurgitation    Mild, echo, flat closure  . Nausea vomiting and diarrhea 01/2010   Hospitalization, probably viral (or partial small bowel obstruction)/ Hospital 09/2010, recurrent possible partial small bowel obstruction, medical therapy  . Other malaise and fatigue   . Warfarin anticoagulation    Atrial fibrillation    Past Surgical History:  Procedure Laterality Date  . AGILE CAPSULE  10/18/2011   Procedure: AGILE CAPSULE;  Surgeon: Rogene Houston, MD;  [facility];  Service: Endoscopy;  Laterality: N/A;  730  . CARDIAC CATHETERIZATION  2004  . CHOLECYSTECTOMY  2010   Dr. Anthony Sar  . COLONOSCOPY  2008  [facility]  . COLONOSCOPY N/A 03/23/2016   Procedure: COLONOSCOPY;  Surgeon: Rogene Houston, MD;  [facility];  Service: Endoscopy;  Laterality: N/A;  1:00  . ESOPHAGOGASTRODUODENOSCOPY  11/08/2011   Procedure: ESOPHAGOGASTRODUODENOSCOPY (EGD);  Surgeon: Rogene Houston, MD;  [facility];  Service: Endoscopy;  Laterality: N/A;  300  . POLYPECTOMY  03/23/2016   Procedure: POLYPECTOMY;  Surgeon: Rogene Houston, MD;  [facility];  Service: Endoscopy;;  Cecal polyp removed via cold forceps recto-sigmoid polyp removed via cold snare    Social History   Social History  . Marital status: Married    Spouse name: N/A  . Number of children: N/A  . Years of education: N/A    Occupational History  . Retired-Hospital Mudlogger  Retired   Social History Main Topics  . Smoking status: Never Smoker  . Smokeless tobacco: Never Used  . Alcohol use No  . Drug use: No  . Sexual activity: Not on file   Other Topics Concern  . Not on file   Social History Narrative   Married     Vitals:   07/30/17 1615  BP: 138/82  Pulse: 86  SpO2: 98%  Weight: 157 lb (71.2 kg)  Height: 5' 8"  (1.727 m)    Wt Readings from Last 3 Encounters:  07/30/17 157 lb (71.2 kg)  06/12/17 160 lb (72.6 kg)  01/31/17 157 lb (71.2 kg)     PHYSICAL EXAM General: NAD HEENT: Normal. Neck: No JVD, no thyromegaly. Lungs: Clear to auscultation bilaterally with normal respiratory effort. CV: Nondisplaced PMI.  Regular rate andirregularrhythm, normal S1/S2, no S3, no murmur. No pretibial or periankle edema.     Abdomen: Nontender, some right lower quadrant fullness is appreciated.  Neurologic: Alert and oriented.  Psych: Normal affect. Skin: Normal. Musculoskeletal: No gross deformities.    ECG: Most recent ECG reviewed.   Labs: Lab Results  Component Value Date/Time   CREATININE 1.27 10/23/2011 11:02 AM   ALT 10 06/15/2017 09:29 AM   HGB 12.8 (L) 06/15/2017 09:31 AM     Lipids: No results found for: LDLCALC, LDLDIRECT, CHOL, TRIG, HDL     ASSESSMENT AND PLAN:  1. Chronic atrial fibrillation: Symptomatically stable on long-acting diltiazem 180 mg twice daily. Anticoagulated with warfarin. No changes.  2. Coronary artery disease with chest pressure: While symptoms may be related to gas buildup from GERD, I cannot be certain it is not cardiac related. I will obtain a Lexiscan Myoview to assess for ischemia. Continue carvedilol and simvastatin.   3. GERD: Symptomatically improved on Protonix 40 mg daily. No changes to therapy. I will check a magnesium level.  4. Hypomagnesemia: On supplementation. I will check a magnesium level.  5. Right lower quadrant  fullness: I will obtain a CT of the abdomen for further clarification.     Disposition: Follow up 6 weeks.   Kate Sable, M.D., F.A.C.C.

## 2017-07-31 ENCOUNTER — Telehealth: Payer: Self-pay | Admitting: Cardiovascular Disease

## 2017-07-31 ENCOUNTER — Other Ambulatory Visit: Payer: Self-pay | Admitting: Cardiovascular Disease

## 2017-07-31 DIAGNOSIS — I2510 Atherosclerotic heart disease of native coronary artery without angina pectoris: Secondary | ICD-10-CM | POA: Diagnosis not present

## 2017-07-31 DIAGNOSIS — I4891 Unspecified atrial fibrillation: Secondary | ICD-10-CM | POA: Diagnosis not present

## 2017-07-31 LAB — MAGNESIUM: Magnesium: 1.8 mg/dL (ref 1.5–2.5)

## 2017-07-31 NOTE — Telephone Encounter (Signed)
Schedule at [facility] Aug 10, 2017 needs to arrive at 8:45  NPO 4 hours prior Pick up contrast at list day before test at [facility] Radiology Drink contrast 2 hour prior to test

## 2017-07-31 NOTE — Addendum Note (Signed)
Addended by: Acquanetta Chain on: 07/31/2017 11:49 AM   Modules accepted: Orders

## 2017-07-31 NOTE — Addendum Note (Signed)
Addended by: Acquanetta Chain on: 07/31/2017 10:19 AM   Modules accepted: Orders

## 2017-08-01 ENCOUNTER — Telehealth: Payer: Self-pay | Admitting: Cardiovascular Disease

## 2017-08-01 ENCOUNTER — Telehealth: Payer: Self-pay | Admitting: *Deleted

## 2017-08-01 NOTE — Telephone Encounter (Signed)
Patient stated that [employer] denied paying for coumadin visit

## 2017-08-01 NOTE — Telephone Encounter (Signed)
Notes recorded by Laurine Blazer, LPN on 40/39/7953 at 1:07 PM EDT Patient notified. Copy to pmd. ------  Notes recorded by Herminio Commons, MD on 08/01/2017 at 8:32 AM EDT Normal.

## 2017-08-02 ENCOUNTER — Other Ambulatory Visit: Payer: Self-pay | Admitting: Cardiovascular Disease

## 2017-08-02 ENCOUNTER — Ambulatory Visit (INDEPENDENT_AMBULATORY_CARE_PROVIDER_SITE_OTHER): Payer: PPO | Admitting: *Deleted

## 2017-08-02 DIAGNOSIS — I25118 Atherosclerotic heart disease of native coronary artery with other forms of angina pectoris: Secondary | ICD-10-CM | POA: Diagnosis not present

## 2017-08-02 DIAGNOSIS — Z7901 Long term (current) use of anticoagulants: Secondary | ICD-10-CM

## 2017-08-02 DIAGNOSIS — I4891 Unspecified atrial fibrillation: Secondary | ICD-10-CM

## 2017-08-02 DIAGNOSIS — I482 Chronic atrial fibrillation: Secondary | ICD-10-CM | POA: Diagnosis not present

## 2017-08-02 DIAGNOSIS — Z5181 Encounter for therapeutic drug level monitoring: Secondary | ICD-10-CM

## 2017-08-02 DIAGNOSIS — R109 Unspecified abdominal pain: Secondary | ICD-10-CM | POA: Diagnosis not present

## 2017-08-02 DIAGNOSIS — R079 Chest pain, unspecified: Secondary | ICD-10-CM | POA: Diagnosis not present

## 2017-08-02 LAB — POCT INR: INR: 3.4

## 2017-08-02 LAB — CREATININE, SERUM: CREATININE: 1.31 mg/dL — AB (ref 0.70–1.11)

## 2017-08-10 ENCOUNTER — Ambulatory Visit (HOSPITAL_COMMUNITY)
Admission: RE | Admit: 2017-08-10 | Discharge: 2017-08-10 | Disposition: A | Discharge status: Home / Self Care | Payer: PPO | Source: Ambulatory Visit | Attending: Cardiovascular Disease | Admitting: Cardiovascular Disease

## 2017-08-10 ENCOUNTER — Encounter (HOSPITAL_COMMUNITY): Payer: Self-pay

## 2017-08-10 DIAGNOSIS — I77811 Abdominal aortic ectasia: Secondary | ICD-10-CM | POA: Diagnosis not present

## 2017-08-10 DIAGNOSIS — R109 Unspecified abdominal pain: Secondary | ICD-10-CM

## 2017-08-10 DIAGNOSIS — K5730 Diverticulosis of large intestine without perforation or abscess without bleeding: Secondary | ICD-10-CM | POA: Diagnosis not present

## 2017-08-10 MED ORDER — IOPAMIDOL (ISOVUE-300) INJECTION 61%
100.0000 mL | Freq: Once | INTRAVENOUS | Status: AC | PRN
  Administered 2017-08-10: 100 mL via INTRAVENOUS

## 2017-08-14 ENCOUNTER — Encounter (HOSPITAL_COMMUNITY): Payer: Self-pay

## 2017-08-14 ENCOUNTER — Encounter (HOSPITAL_BASED_OUTPATIENT_CLINIC_OR_DEPARTMENT_OTHER)
Admission: RE | Admit: 2017-08-14 | Discharge: 2017-08-14 | Disposition: A | Discharge status: Home / Self Care | Payer: PPO | Source: Ambulatory Visit | Attending: Cardiovascular Disease | Admitting: Cardiovascular Disease

## 2017-08-14 ENCOUNTER — Encounter (HOSPITAL_COMMUNITY)
Admission: RE | Admit: 2017-08-14 | Discharge: 2017-08-14 | Disposition: A | Discharge status: Home / Self Care | Payer: PPO | Source: Ambulatory Visit | Attending: Cardiovascular Disease | Admitting: Cardiovascular Disease

## 2017-08-14 ENCOUNTER — Telehealth: Payer: Self-pay | Admitting: *Deleted

## 2017-08-14 DIAGNOSIS — R079 Chest pain, unspecified: Secondary | ICD-10-CM | POA: Insufficient documentation

## 2017-08-14 LAB — NM MYOCAR MULTI W/SPECT W/WALL MOTION / EF
CHL CUP NUCLEAR SRS: 1
CHL CUP NUCLEAR SSS: 3
CHL CUP RESTING HR STRESS: 82 {beats}/min
LHR: 0.34
LV sys vol: 33 mL
LVDIAVOL: 81 mL (ref 62–150)
NUC STRESS TID: 1.03
Peak HR: 111 {beats}/min
SDS: 2

## 2017-08-14 MED ORDER — TECHNETIUM TC 99M TETROFOSMIN IV KIT
30.0000 | PACK | Freq: Once | INTRAVENOUS | Status: AC | PRN
  Administered 2017-08-14: 30 via INTRAVENOUS

## 2017-08-14 MED ORDER — TECHNETIUM TC 99M TETROFOSMIN IV KIT
10.0000 | PACK | Freq: Once | INTRAVENOUS | Status: AC | PRN
  Administered 2017-08-14: 10 via INTRAVENOUS

## 2017-08-14 MED ORDER — SODIUM CHLORIDE 0.9% FLUSH
INTRAVENOUS | Status: AC
  Administered 2017-08-14: 10 mL via INTRAVENOUS
  Filled 2017-08-14: qty 10

## 2017-08-14 MED ORDER — REGADENOSON 0.4 MG/5ML IV SOLN
INTRAVENOUS | Status: AC
  Administered 2017-08-14: 0.4 mg via INTRAVENOUS
  Filled 2017-08-14: qty 5

## 2017-08-14 NOTE — Telephone Encounter (Signed)
STRESS TEST -  Notes recorded by Herminio Commons, MD on 08/14/2017 at 1:04 PM EDT Low risk. Evidence of possible small prior heart attack. No new blockages. Continue current meds.   CT ABDOMEN & PELVIS -  Notes recorded by Herminio Commons, MD on 08/10/2017 at 1:59 PM EDT Mildly distended bladder with several small diverticula, likely related to chronic outlet obstruction given enlarged prostate. No masses.

## 2017-08-14 NOTE — Telephone Encounter (Signed)
Notes recorded by Laurine Blazer, LPN on 74/25/5258 at 5:51 PM EDT Patient notified. Copy to pmd. Follow up scheduled for 09/11/2017.

## 2017-08-23 ENCOUNTER — Ambulatory Visit (INDEPENDENT_AMBULATORY_CARE_PROVIDER_SITE_OTHER): Payer: PPO | Admitting: *Deleted

## 2017-08-23 DIAGNOSIS — Z5181 Encounter for therapeutic drug level monitoring: Secondary | ICD-10-CM

## 2017-08-23 DIAGNOSIS — I4891 Unspecified atrial fibrillation: Secondary | ICD-10-CM | POA: Diagnosis not present

## 2017-08-23 DIAGNOSIS — Z7901 Long term (current) use of anticoagulants: Secondary | ICD-10-CM

## 2017-08-23 LAB — POCT INR: INR: 2.4

## 2017-08-30 ENCOUNTER — Other Ambulatory Visit: Payer: Self-pay | Admitting: Cardiovascular Disease

## 2017-09-11 ENCOUNTER — Ambulatory Visit: Payer: PPO | Admitting: Cardiovascular Disease

## 2017-09-11 ENCOUNTER — Encounter: Payer: Self-pay | Admitting: Cardiovascular Disease

## 2017-09-11 VITALS — BP 130/72 | HR 87 | Ht 68.0 in | Wt 158.0 lb

## 2017-09-11 DIAGNOSIS — N400 Enlarged prostate without lower urinary tract symptoms: Secondary | ICD-10-CM

## 2017-09-11 DIAGNOSIS — R109 Unspecified abdominal pain: Secondary | ICD-10-CM | POA: Diagnosis not present

## 2017-09-11 DIAGNOSIS — K219 Gastro-esophageal reflux disease without esophagitis: Secondary | ICD-10-CM | POA: Diagnosis not present

## 2017-09-11 DIAGNOSIS — I25118 Atherosclerotic heart disease of native coronary artery with other forms of angina pectoris: Secondary | ICD-10-CM | POA: Diagnosis not present

## 2017-09-11 DIAGNOSIS — R079 Chest pain, unspecified: Secondary | ICD-10-CM | POA: Diagnosis not present

## 2017-09-11 DIAGNOSIS — Z7901 Long term (current) use of anticoagulants: Secondary | ICD-10-CM

## 2017-09-11 DIAGNOSIS — I482 Chronic atrial fibrillation, unspecified: Secondary | ICD-10-CM

## 2017-09-11 NOTE — Patient Instructions (Addendum)
Medication Instructions:   Reminder to take the Protonix 30-60 minutes prior to meal.   Continue all other current medications.  Labwork: none  Testing/Procedures: none  Follow-Up: Your physician wants you to follow up in: 6 months.  You will receive a reminder letter in the mail one-two months in advance.  If you don't receive a letter, please call our office to schedule the follow up appointment   Any Other Special Instructions Will Be Listed Below (If Applicable).  If you need a refill on your cardiac medications before your next appointment, please call your pharmacy.

## 2017-09-11 NOTE — Progress Notes (Signed)
SUBJECTIVE: The patient presents for follow-up after undergoing cardiovascular testing.  He underwent a low risk nuclear stress test which demonstrated prior inferoseptal myocardial infarction without ischemia.  CT of the abdomen and pelvis demonstrated a mildly distended bladder with several small diverticula likely related to chronic outlet obstruction given his enlarged prostate.  There were no masses.  He also has chronic atrial fibrillation.  Since I started Protonix, he very seldom experiences GERD.  CT abdomen also demonstrated a small high he has been taking Protonix in the morning at about 730.  When he does experience acid reflux symptoms, primarily occurs at bedtime around 9:30.  He eats a good-sized lunch and a very light dinner.  I spoke to him at length about all of his test results.  Review of Systems: As per "subjective", otherwise negative.  No Known Allergies  Current Outpatient Medications  Medication Sig Dispense Refill  . acetaminophen (TYLENOL) 500 MG tablet Take 500 mg by mouth every 6 (six) hours as needed.    . carvedilol (COREG) 6.25 MG tablet TAKE ONE TABLET BY MOUTH TWICE DAILY. 60 tablet 6  . diltiazem (CARDIZEM CD) 180 MG 24 hr capsule Take 180 mg by mouth 2 (two) times daily.     Marland Kitchen levothyroxine (SYNTHROID, LEVOTHROID) 75 MCG tablet Take 75 mcg by mouth daily before breakfast.    . magnesium gluconate (MAGONATE) 500 MG tablet Take 500 mg by mouth 2 (two) times daily.     . Melatonin 3 MG TABS Take by mouth at bedtime as needed.    . nitroGLYCERIN (NITROSTAT) 0.4 MG SL tablet Place 0.4 mg under the tongue every 5 (five) minutes as needed. For chest pains. May repeat for up to 3 doses.    . pantoprazole (PROTONIX) 40 MG tablet Take 1 tablet (40 mg total) by mouth daily. 30 tablet 6  . simvastatin (ZOCOR) 10 MG tablet Take 10 mg by mouth at bedtime.     . Tamsulosin HCl (FLOMAX) 0.4 MG CAPS Take 0.4 mg by mouth every evening.     . warfarin (COUMADIN) 2.5  MG tablet Take 2.5 mg by mouth daily. As directed by coumadin clinic     No current facility-administered medications for this visit.     Past Medical History:  Diagnosis Date  . Atrial fibrillation ([place])    Permanent  . CAD (coronary artery disease)    Catheterization 2004, mild/moderate nonobstructive disease  /   nuclear, 2007, small inferior scar // no ischemia  . Carotid bruit    Doppler, December 23, 2010, no significant carotid disease  . Clot    LA, small, in past  . Dizziness 03/2010   June, 2011   Mild, stop digoxin, felt better  . Ejection fraction 02/2009   EF 55-60%,Echo, May, 2010  . Elevated PSA   . GERD (gastroesophageal reflux disease)   . LVH (left ventricular hypertrophy) 02/2009    Mild, echo, 2010  . Mitral regurgitation    Mild, echo, flat closure  . Nausea vomiting and diarrhea 01/2010   Hospitalization, probably viral (or partial small bowel obstruction)/ Hospital 09/2010, recurrent possible partial small bowel obstruction, medical therapy  . Other malaise and fatigue   . Warfarin anticoagulation    Atrial fibrillation    Past Surgical History:  Procedure Laterality Date  . AGILE CAPSULE  10/18/2011   Procedure: AGILE CAPSULE;  Surgeon: Rogene Houston, MD;  [facility];  Service: Endoscopy;  Laterality: N/A;  [place]  2004  . CHOLECYSTECTOMY  2010   Dr. Anthony Sar  . COLONOSCOPY  2008   [facility]  . COLONOSCOPY N/A 03/23/2016   Procedure: COLONOSCOPY;  Surgeon: Rogene Houston, MD;  [facility];  Service: Endoscopy;  Laterality: N/A;  1:00  . ESOPHAGOGASTRODUODENOSCOPY  11/08/2011   Procedure: ESOPHAGOGASTRODUODENOSCOPY (EGD);  Surgeon: Rogene Houston, MD;  [facility];  Service: Endoscopy;  Laterality: N/A;  300  . POLYPECTOMY  03/23/2016   Procedure: POLYPECTOMY;  Surgeon: Rogene Houston, MD;  [facility];  Service: Endoscopy;;  Cecal polyp removed via cold forceps recto-sigmoid polyp  removed via cold snare    Social History   Socioeconomic History  . Marital status: Married    Spouse name: Not on file  . Number of children: Not on file  . Years of education: Not on file  . Highest education level: Not on file  Social Needs  . Financial resource strain: Not on file  . Food insecurity - worry: Not on file  . Food insecurity - inability: Not on file  . Transportation needs - medical: Not on file  . Transportation needs - non-medical: Not on file  Occupational History  . Occupation: Engineer, agricultural: RETIRED  Tobacco Use  . Smoking status: Never Smoker  . Smokeless tobacco: Never Used  Substance and Sexual Activity  . Alcohol use: No    Alcohol/week: 0.0 oz  . Drug use: No  . Sexual activity: Not on file  Other Topics Concern  . Not on file  Social History Narrative   Married     Vitals:   09/11/17 1252  BP: 130/72  Pulse: 87  SpO2: 98%  Weight: 158 lb (71.7 kg)  Height: 5' 8"  (1.727 m)    Wt Readings from Last 3 Encounters:  09/11/17 158 lb (71.7 kg)  07/30/17 157 lb (71.2 kg)  06/12/17 160 lb (72.6 kg)     PHYSICAL EXAM General: NAD HEENT: Normal. Neck: No JVD, no thyromegaly. Lungs: Clear to auscultation bilaterally with normal respiratory effort. CV: Regular rate and rhythm, normal S1/S2, no S3/S4, no murmur. No pretibial or periankle edema.  Abdomen: Soft, nontender, no distention.  Neurologic: Alert and oriented.  Psych: Normal affect. Skin: Normal. Musculoskeletal: No gross deformities.    ECG: Most recent ECG reviewed.   Labs: Lab Results  Component Value Date/Time   CREATININE 1.31 (H) 08/02/2017 10:38 AM   ALT 10 06/15/2017 09:29 AM   HGB 12.8 (L) 06/15/2017 09:31 AM     Lipids: No results found for: LDLCALC, LDLDIRECT, CHOL, TRIG, HDL     ASSESSMENT AND PLAN:  1. Chronic atrial fibrillation: Symptomatically stable on long-acting diltiazem 180 mg twice daily. Anticoagulated with warfarin.  No changes.  2. Coronary artery disease with chest pressure:  Stress test results which were low risk noted above. Continue carvedilol and simvastatin.  No aspirin as he is on warfarin.  Chest pressure may have been related to GERD and small hiatal hernia.  3. GERD:Symptomatically improved on Protonix 40 mg daily.  I instructed him to take it 30-60 minutes before lunch.  4. Hypomagnesemia: On supplementation.  Normal magnesium level of 1.8 on 07/31/17.  5. Right lower quadrant fullness: CT results noted above.  6.  Enlarged prostate: I asked him to speak with his PCP when he sees him within the next few weeks.   Disposition: Follow up 6 months  Kate Sable, M.D., F.A.C.C.

## 2017-09-20 ENCOUNTER — Ambulatory Visit (INDEPENDENT_AMBULATORY_CARE_PROVIDER_SITE_OTHER): Payer: PPO | Admitting: *Deleted

## 2017-09-20 DIAGNOSIS — I4891 Unspecified atrial fibrillation: Secondary | ICD-10-CM

## 2017-09-20 DIAGNOSIS — Z5181 Encounter for therapeutic drug level monitoring: Secondary | ICD-10-CM

## 2017-09-20 LAB — POCT INR: INR: 3.2

## 2017-09-21 DIAGNOSIS — E7800 Pure hypercholesterolemia, unspecified: Secondary | ICD-10-CM | POA: Diagnosis not present

## 2017-09-21 DIAGNOSIS — R7301 Impaired fasting glucose: Secondary | ICD-10-CM | POA: Diagnosis not present

## 2017-09-21 DIAGNOSIS — I10 Essential (primary) hypertension: Secondary | ICD-10-CM | POA: Diagnosis not present

## 2017-09-21 DIAGNOSIS — I2510 Atherosclerotic heart disease of native coronary artery without angina pectoris: Secondary | ICD-10-CM | POA: Diagnosis not present

## 2017-09-21 DIAGNOSIS — N183 Chronic kidney disease, stage 3 (moderate): Secondary | ICD-10-CM | POA: Diagnosis not present

## 2017-09-21 DIAGNOSIS — E039 Hypothyroidism, unspecified: Secondary | ICD-10-CM | POA: Diagnosis not present

## 2017-09-26 DIAGNOSIS — N183 Chronic kidney disease, stage 3 (moderate): Secondary | ICD-10-CM | POA: Diagnosis not present

## 2017-09-26 DIAGNOSIS — S29019A Strain of muscle and tendon of unspecified wall of thorax, initial encounter: Secondary | ICD-10-CM | POA: Diagnosis not present

## 2017-09-26 DIAGNOSIS — E7800 Pure hypercholesterolemia, unspecified: Secondary | ICD-10-CM | POA: Diagnosis not present

## 2017-09-26 DIAGNOSIS — R7301 Impaired fasting glucose: Secondary | ICD-10-CM | POA: Diagnosis not present

## 2017-09-26 DIAGNOSIS — I482 Chronic atrial fibrillation: Secondary | ICD-10-CM | POA: Diagnosis not present

## 2017-09-26 DIAGNOSIS — K210 Gastro-esophageal reflux disease with esophagitis: Secondary | ICD-10-CM | POA: Diagnosis not present

## 2017-09-26 DIAGNOSIS — E039 Hypothyroidism, unspecified: Secondary | ICD-10-CM | POA: Diagnosis not present

## 2017-09-26 DIAGNOSIS — I10 Essential (primary) hypertension: Secondary | ICD-10-CM | POA: Diagnosis not present

## 2017-10-18 ENCOUNTER — Ambulatory Visit (INDEPENDENT_AMBULATORY_CARE_PROVIDER_SITE_OTHER): Payer: PPO | Admitting: *Deleted

## 2017-10-18 DIAGNOSIS — I4891 Unspecified atrial fibrillation: Secondary | ICD-10-CM

## 2017-10-18 DIAGNOSIS — Z5181 Encounter for therapeutic drug level monitoring: Secondary | ICD-10-CM | POA: Diagnosis not present

## 2017-10-18 LAB — POCT INR: INR: 3.1

## 2017-10-18 NOTE — Patient Instructions (Signed)
Decrease coumadin to 1 tablet daily  Recheck in 4 weeks

## 2017-10-23 ENCOUNTER — Telehealth: Payer: Self-pay | Admitting: *Deleted

## 2017-10-23 DIAGNOSIS — M5431 Sciatica, right side: Secondary | ICD-10-CM | POA: Diagnosis not present

## 2017-10-23 DIAGNOSIS — M545 Low back pain: Secondary | ICD-10-CM | POA: Diagnosis not present

## 2017-10-23 DIAGNOSIS — Z6823 Body mass index (BMI) 23.0-23.9, adult: Secondary | ICD-10-CM | POA: Diagnosis not present

## 2017-10-23 NOTE — Telephone Encounter (Signed)
Pt called.  Has been started on prednisone taper (65m x 5, 458mx 5, 2010m 5) for back and hip pain.  Started today.  Told pt to decrease coumadin to 2.5mg39mily except 1.25mg60mM,W,F until INR check on 10/30/17.  Will finish prednisone on 11/06/17.  Pt verbalized understanding.

## 2017-10-30 ENCOUNTER — Ambulatory Visit (INDEPENDENT_AMBULATORY_CARE_PROVIDER_SITE_OTHER): Payer: PPO | Admitting: *Deleted

## 2017-10-30 DIAGNOSIS — Z5181 Encounter for therapeutic drug level monitoring: Secondary | ICD-10-CM | POA: Diagnosis not present

## 2017-10-30 DIAGNOSIS — I4891 Unspecified atrial fibrillation: Secondary | ICD-10-CM | POA: Diagnosis not present

## 2017-10-30 LAB — POCT INR: INR: 4.1

## 2017-10-30 NOTE — Patient Instructions (Signed)
Hold coumadin tonight then resume 1 tablet daily except 1/2 tablet on M,W,F until INR check on 11/06/17

## 2017-11-06 ENCOUNTER — Ambulatory Visit (INDEPENDENT_AMBULATORY_CARE_PROVIDER_SITE_OTHER): Payer: PPO | Admitting: *Deleted

## 2017-11-06 DIAGNOSIS — Z5181 Encounter for therapeutic drug level monitoring: Secondary | ICD-10-CM

## 2017-11-06 DIAGNOSIS — I4891 Unspecified atrial fibrillation: Secondary | ICD-10-CM

## 2017-11-06 LAB — POCT INR: INR: 4.1

## 2017-11-06 NOTE — Patient Instructions (Signed)
Hold coumadin tonight, take 1/2 tablet tomorrow night then restart 1 tablet daily

## 2017-11-22 ENCOUNTER — Ambulatory Visit (INDEPENDENT_AMBULATORY_CARE_PROVIDER_SITE_OTHER): Payer: PPO | Admitting: *Deleted

## 2017-11-22 DIAGNOSIS — Z5181 Encounter for therapeutic drug level monitoring: Secondary | ICD-10-CM | POA: Diagnosis not present

## 2017-11-22 DIAGNOSIS — I4891 Unspecified atrial fibrillation: Secondary | ICD-10-CM

## 2017-11-22 LAB — POCT INR: INR: 2.8

## 2017-11-22 NOTE — Patient Instructions (Signed)
Continue coumadin 1 tablet daily Recheck in 3 weeks

## 2017-12-06 ENCOUNTER — Other Ambulatory Visit: Payer: Self-pay | Admitting: Cardiovascular Disease

## 2017-12-06 DIAGNOSIS — R000 Tachycardia, unspecified: Secondary | ICD-10-CM | POA: Diagnosis not present

## 2017-12-06 DIAGNOSIS — E039 Hypothyroidism, unspecified: Secondary | ICD-10-CM | POA: Diagnosis not present

## 2017-12-06 DIAGNOSIS — I4891 Unspecified atrial fibrillation: Secondary | ICD-10-CM | POA: Diagnosis not present

## 2017-12-06 DIAGNOSIS — K219 Gastro-esophageal reflux disease without esophagitis: Secondary | ICD-10-CM | POA: Diagnosis not present

## 2017-12-06 DIAGNOSIS — R42 Dizziness and giddiness: Secondary | ICD-10-CM | POA: Diagnosis not present

## 2017-12-06 DIAGNOSIS — Z79899 Other long term (current) drug therapy: Secondary | ICD-10-CM | POA: Diagnosis not present

## 2017-12-06 DIAGNOSIS — R0789 Other chest pain: Secondary | ICD-10-CM | POA: Diagnosis not present

## 2017-12-06 DIAGNOSIS — R079 Chest pain, unspecified: Secondary | ICD-10-CM | POA: Diagnosis not present

## 2017-12-13 ENCOUNTER — Ambulatory Visit (INDEPENDENT_AMBULATORY_CARE_PROVIDER_SITE_OTHER): Payer: PPO | Admitting: *Deleted

## 2017-12-13 DIAGNOSIS — I4891 Unspecified atrial fibrillation: Secondary | ICD-10-CM | POA: Diagnosis not present

## 2017-12-13 DIAGNOSIS — Z5181 Encounter for therapeutic drug level monitoring: Secondary | ICD-10-CM

## 2017-12-13 LAB — POCT INR: INR: 2.4

## 2017-12-13 NOTE — Patient Instructions (Signed)
Continue coumadin 1 tablet daily Recheck in 4 weeks

## 2017-12-20 DIAGNOSIS — Z85828 Personal history of other malignant neoplasm of skin: Secondary | ICD-10-CM | POA: Diagnosis not present

## 2017-12-20 DIAGNOSIS — D044 Carcinoma in situ of skin of scalp and neck: Secondary | ICD-10-CM | POA: Diagnosis not present

## 2017-12-20 DIAGNOSIS — D485 Neoplasm of uncertain behavior of skin: Secondary | ICD-10-CM | POA: Diagnosis not present

## 2017-12-20 DIAGNOSIS — L570 Actinic keratosis: Secondary | ICD-10-CM | POA: Diagnosis not present

## 2018-01-03 DIAGNOSIS — C4442 Squamous cell carcinoma of skin of scalp and neck: Secondary | ICD-10-CM | POA: Diagnosis not present

## 2018-01-10 ENCOUNTER — Ambulatory Visit (INDEPENDENT_AMBULATORY_CARE_PROVIDER_SITE_OTHER): Payer: PPO | Admitting: *Deleted

## 2018-01-10 DIAGNOSIS — I4891 Unspecified atrial fibrillation: Secondary | ICD-10-CM | POA: Diagnosis not present

## 2018-01-10 DIAGNOSIS — Z5181 Encounter for therapeutic drug level monitoring: Secondary | ICD-10-CM | POA: Diagnosis not present

## 2018-01-10 LAB — POCT INR: INR: 2.1

## 2018-01-10 NOTE — Patient Instructions (Signed)
Continue coumadin 1 tablet daily Recheck in 6 weeks

## 2018-01-29 DIAGNOSIS — Z6824 Body mass index (BMI) 24.0-24.9, adult: Secondary | ICD-10-CM | POA: Diagnosis not present

## 2018-01-29 DIAGNOSIS — G2581 Restless legs syndrome: Secondary | ICD-10-CM | POA: Diagnosis not present

## 2018-01-29 DIAGNOSIS — H9202 Otalgia, left ear: Secondary | ICD-10-CM | POA: Diagnosis not present

## 2018-02-21 ENCOUNTER — Ambulatory Visit (INDEPENDENT_AMBULATORY_CARE_PROVIDER_SITE_OTHER): Payer: PPO | Admitting: *Deleted

## 2018-02-21 DIAGNOSIS — Z5181 Encounter for therapeutic drug level monitoring: Secondary | ICD-10-CM | POA: Diagnosis not present

## 2018-02-21 DIAGNOSIS — I4891 Unspecified atrial fibrillation: Secondary | ICD-10-CM | POA: Diagnosis not present

## 2018-02-21 LAB — POCT INR: INR: 2.4

## 2018-02-21 NOTE — Patient Instructions (Signed)
Continue coumadin 1 tablet daily Recheck in 6 weeks

## 2018-02-28 IMAGING — NM NM MYOCAR MULTI W/SPECT W/WALL MOTION & EF
2 series · 12 of 12 positions shown · non-contrast
Comparison: none

[Series 1: rest · 6.51mm/px · 6 of 64 frames shown]
[frame 6/64]
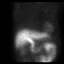
[frame 16/64]
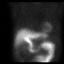
[frame 27/64]
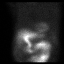
[frame 38/64]
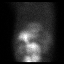
[frame 48/64]
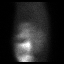
[frame 59/64]
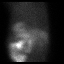

[Series 2: stress gated · 6.51mm/px · 6 of 64 frames shown]
[frame 6/64]
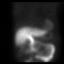
[frame 16/64]
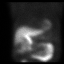
[frame 27/64]
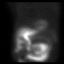
[frame 38/64]
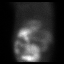
[frame 48/64]
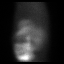
[frame 59/64]
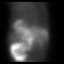

[12 of 12 positions shown; findings below may reference images not displayed]

Canned report from images found in remote index.

Refer to host system for actual result text.

## 2018-03-13 DIAGNOSIS — W57XXXA Bitten or stung by nonvenomous insect and other nonvenomous arthropods, initial encounter: Secondary | ICD-10-CM | POA: Diagnosis not present

## 2018-03-13 DIAGNOSIS — S40861A Insect bite (nonvenomous) of right upper arm, initial encounter: Secondary | ICD-10-CM | POA: Diagnosis not present

## 2018-03-13 DIAGNOSIS — Z6824 Body mass index (BMI) 24.0-24.9, adult: Secondary | ICD-10-CM | POA: Diagnosis not present

## 2018-03-18 ENCOUNTER — Ambulatory Visit: Payer: PPO | Admitting: Cardiovascular Disease

## 2018-03-18 ENCOUNTER — Encounter: Payer: Self-pay | Admitting: Cardiovascular Disease

## 2018-03-18 VITALS — BP 110/64 | HR 92 | Ht 68.0 in | Wt 173.0 lb

## 2018-03-18 DIAGNOSIS — I482 Chronic atrial fibrillation: Secondary | ICD-10-CM

## 2018-03-18 DIAGNOSIS — K219 Gastro-esophageal reflux disease without esophagitis: Secondary | ICD-10-CM

## 2018-03-18 DIAGNOSIS — I25118 Atherosclerotic heart disease of native coronary artery with other forms of angina pectoris: Secondary | ICD-10-CM | POA: Diagnosis not present

## 2018-03-18 DIAGNOSIS — Z7901 Long term (current) use of anticoagulants: Secondary | ICD-10-CM | POA: Diagnosis not present

## 2018-03-18 DIAGNOSIS — I4821 Permanent atrial fibrillation: Secondary | ICD-10-CM

## 2018-03-18 DIAGNOSIS — G8929 Other chronic pain: Secondary | ICD-10-CM

## 2018-03-18 DIAGNOSIS — M549 Dorsalgia, unspecified: Secondary | ICD-10-CM | POA: Diagnosis not present

## 2018-03-18 NOTE — Patient Instructions (Addendum)

## 2018-03-18 NOTE — Progress Notes (Signed)
SUBJECTIVE: The patient presents for routine follow-up.  He underwent a low risk nuclear stress test in October 2018 demonstrated prior inferoseptal myocardial infarction without ischemia. He has coronary artery disease, permanent atrial fibrillation, and GERD.  He denies chest pain, palpitations, and exertional dyspnea.  His primary complaint relates to arthritis of the back which is lifestyle limiting.  He used to jog about 3 miles a day up until about 6 years ago.  He tries to walk at least a third of a mile daily.  He has been on iron for the past 2 to 3 months and he does feel like his energy levels have improved to some degree.  He is scheduled to see his PCP, Dr. Quintin Alto, next week. ECG performed today which I personally reviewed demonstrates atrial fibrillation, 87 bpm, with old anteroseptal infarct.    Review of Systems: As per "subjective", otherwise negative.  No Known Allergies  Current Outpatient Medications  Medication Sig Dispense Refill  . acetaminophen (TYLENOL) 500 MG tablet Take 500 mg by mouth every 6 (six) hours as needed.    . carvedilol (COREG) 6.25 MG tablet TAKE ONE TABLET BY MOUTH TWICE DAILY. 60 tablet 6  . diltiazem (CARDIZEM CD) 180 MG 24 hr capsule Take 180 mg by mouth 2 (two) times daily.     . ferrous sulfate 325 (65 FE) MG tablet Take 325 mg by mouth daily with breakfast.    . levothyroxine (SYNTHROID, LEVOTHROID) 75 MCG tablet Take 75 mcg by mouth daily before breakfast.    . magnesium gluconate (MAGONATE) 500 MG tablet Take 500 mg by mouth 2 (two) times daily.     . Melatonin 3 MG TABS Take by mouth at bedtime as needed.    . nitroGLYCERIN (NITROSTAT) 0.4 MG SL tablet Place 0.4 mg under the tongue every 5 (five) minutes as needed. For chest pains. May repeat for up to 3 doses.    . pantoprazole (PROTONIX) 40 MG tablet TAKE ONE TABLET BY MOUTH DAILY. 90 tablet 1  . simvastatin (ZOCOR) 10 MG tablet Take 10 mg by mouth at bedtime.     . Tamsulosin  HCl (FLOMAX) 0.4 MG CAPS Take 0.4 mg by mouth every evening.     . warfarin (COUMADIN) 2.5 MG tablet Take 2.5 mg by mouth as directed. As directed by coumadin clinic      No current facility-administered medications for this visit.     Past Medical History:  Diagnosis Date  . Atrial fibrillation ([place])    Permanent  . CAD (coronary artery disease)    Catheterization 2004, mild/moderate nonobstructive disease  /   nuclear, 2007, small inferior scar // no ischemia  . Carotid bruit    Doppler, December 23, 2010, no significant carotid disease  . Clot    LA, small, in past  . Dizziness 03/2010   June, 2011   Mild, stop digoxin, felt better  . Ejection fraction 02/2009   EF 55-60%,Echo, May, 2010  . Elevated PSA   . GERD (gastroesophageal reflux disease)   . LVH (left ventricular hypertrophy) 02/2009    Mild, echo, 2010  . Mitral regurgitation    Mild, echo, flat closure  . Nausea vomiting and diarrhea 01/2010   Hospitalization, probably viral (or partial small bowel obstruction)/ Hospital 09/2010, recurrent possible partial small bowel obstruction, medical therapy  . Other malaise and fatigue   . Warfarin anticoagulation    Atrial fibrillation    Past Surgical History:  Procedure Laterality  Date  . Sonda Rumble CAPSULE  10/18/2011   Procedure: AGILE CAPSULE;  Surgeon: Rogene Houston, MD;  [facility];  Service: Endoscopy;  Laterality: N/A;  730  . CARDIAC CATHETERIZATION  2004  . CHOLECYSTECTOMY  2010   Dr. Anthony Sar  . COLONOSCOPY  2008   [facility]  . COLONOSCOPY N/A 03/23/2016   Procedure: COLONOSCOPY;  Surgeon: Rogene Houston, MD;  [facility];  Service: Endoscopy;  Laterality: N/A;  1:00  . ESOPHAGOGASTRODUODENOSCOPY  11/08/2011   Procedure: ESOPHAGOGASTRODUODENOSCOPY (EGD);  Surgeon: Rogene Houston, MD;  [facility];  Service: Endoscopy;  Laterality: N/A;  300  . POLYPECTOMY  03/23/2016   Procedure: POLYPECTOMY;  Surgeon: Rogene Houston, MD;  [facility];  Service: Endoscopy;;  Cecal polyp removed via cold forceps recto-sigmoid polyp removed via cold snare    Social History   Socioeconomic History  . Marital status: Married    Spouse name: Not on file  . Number of children: Not on file  . Years of education: Not on file  . Highest education level: Not on file  Occupational History  . Occupation: Engineer, agricultural: RETIRED  Social Needs  . Financial resource strain: Not on file  . Food insecurity:    Worry: Not on file    Inability: Not on file  . Transportation needs:    Medical: Not on file    Non-medical: Not on file  Tobacco Use  . Smoking status: Never Smoker  . Smokeless tobacco: Never Used  Substance and Sexual Activity  . Alcohol use: No    Alcohol/week: 0.0 oz  . Drug use: No  . Sexual activity: Not on file  Lifestyle  . Physical activity:    Days per week: Not on file    Minutes per session: Not on file  . Stress: Not on file  Relationships  . Social connections:    Talks on phone: Not on file    Gets together: Not on file    Attends religious service: Not on file    Active member of club or organization: Not on file    Attends meetings of clubs or organizations: Not on file    Relationship status: Not on file  . Intimate partner violence:    Fear of current or ex partner: Not on file    Emotionally abused: Not on file    Physically abused: Not on file    Forced sexual activity: Not on file  Other Topics Concern  . Not on file  Social History Narrative   Married     Vitals:   03/18/18 0954  BP: 110/64  Pulse: 92  SpO2: 98%  Weight: 173 lb (78.5 kg)  Height: 5' 8"  (1.727 m)    Wt Readings from Last 3 Encounters:  03/18/18 173 lb (78.5 kg)  09/11/17 158 lb (71.7 kg)  07/30/17 157 lb (71.2 kg)     PHYSICAL EXAM General: NAD HEENT: Normal. Neck: No JVD, no thyromegaly. Lungs: Clear to auscultation bilaterally with normal respiratory effort. CV:  Regular rate and irregular rhythm, normal S1/S2, no S3, no murmur. No pretibial or periankle edema.  Abdomen: Soft, nontender, no distention.  Neurologic: Alert and oriented.  Psych: Normal affect. Skin: Normal. Musculoskeletal: No gross deformities.    ECG: Most recent ECG reviewed.   Labs: Lab Results  Component Value Date/Time   CREATININE 1.31 (H) 08/02/2017 10:38 AM   ALT 10 06/15/2017  09:29 AM   HGB 12.8 (L) 06/15/2017 09:31 AM     Lipids: No results found for: LDLCALC, LDLDIRECT, CHOL, TRIG, HDL     ASSESSMENT AND PLAN: 1.  Permanent atrial fibrillation: Symptomatically stable on long-acting diltiazem 180 mg twice daily. Anticoagulated with warfarin. No changes.  2. Coronary artery disease: Symptomatically stable.  Low risk nuclear stress test in October 2018 as noted above.Continuecarvedilol and simvastatin.  No aspirin as he is on warfarin.   3. GERD:Symptomatically stable on Protonix 40 mg daily.  I previously instructed him to take it 30-60 minutes before lunch.  4.  Back pain/arthritis: I encouraged him to speak with his PCP regarding this as it is lifestyle limiting.    Disposition: Follow up 6 months   Kate Sable, M.D., F.A.C.C.

## 2018-03-22 DIAGNOSIS — I10 Essential (primary) hypertension: Secondary | ICD-10-CM | POA: Diagnosis not present

## 2018-03-22 DIAGNOSIS — K210 Gastro-esophageal reflux disease with esophagitis: Secondary | ICD-10-CM | POA: Diagnosis not present

## 2018-03-22 DIAGNOSIS — N183 Chronic kidney disease, stage 3 (moderate): Secondary | ICD-10-CM | POA: Diagnosis not present

## 2018-03-22 DIAGNOSIS — R7301 Impaired fasting glucose: Secondary | ICD-10-CM | POA: Diagnosis not present

## 2018-03-22 DIAGNOSIS — E039 Hypothyroidism, unspecified: Secondary | ICD-10-CM | POA: Diagnosis not present

## 2018-03-22 DIAGNOSIS — E7800 Pure hypercholesterolemia, unspecified: Secondary | ICD-10-CM | POA: Diagnosis not present

## 2018-03-22 DIAGNOSIS — G2581 Restless legs syndrome: Secondary | ICD-10-CM | POA: Diagnosis not present

## 2018-03-22 DIAGNOSIS — I482 Chronic atrial fibrillation: Secondary | ICD-10-CM | POA: Diagnosis not present

## 2018-03-26 ENCOUNTER — Other Ambulatory Visit: Payer: Self-pay | Admitting: Cardiovascular Disease

## 2018-03-26 DIAGNOSIS — E7800 Pure hypercholesterolemia, unspecified: Secondary | ICD-10-CM | POA: Diagnosis not present

## 2018-03-26 DIAGNOSIS — I482 Chronic atrial fibrillation: Secondary | ICD-10-CM | POA: Diagnosis not present

## 2018-03-26 DIAGNOSIS — E039 Hypothyroidism, unspecified: Secondary | ICD-10-CM | POA: Diagnosis not present

## 2018-03-26 DIAGNOSIS — M545 Low back pain: Secondary | ICD-10-CM | POA: Diagnosis not present

## 2018-03-26 DIAGNOSIS — R7301 Impaired fasting glucose: Secondary | ICD-10-CM | POA: Diagnosis not present

## 2018-03-26 DIAGNOSIS — I10 Essential (primary) hypertension: Secondary | ICD-10-CM | POA: Diagnosis not present

## 2018-03-26 DIAGNOSIS — N183 Chronic kidney disease, stage 3 (moderate): Secondary | ICD-10-CM | POA: Diagnosis not present

## 2018-03-26 DIAGNOSIS — Z0001 Encounter for general adult medical examination with abnormal findings: Secondary | ICD-10-CM | POA: Diagnosis not present

## 2018-03-26 DIAGNOSIS — Z6824 Body mass index (BMI) 24.0-24.9, adult: Secondary | ICD-10-CM | POA: Diagnosis not present

## 2018-03-28 DIAGNOSIS — M47816 Spondylosis without myelopathy or radiculopathy, lumbar region: Secondary | ICD-10-CM | POA: Diagnosis not present

## 2018-03-28 DIAGNOSIS — M545 Low back pain: Secondary | ICD-10-CM | POA: Diagnosis not present

## 2018-04-04 ENCOUNTER — Ambulatory Visit (INDEPENDENT_AMBULATORY_CARE_PROVIDER_SITE_OTHER): Payer: PPO | Admitting: *Deleted

## 2018-04-04 DIAGNOSIS — I4891 Unspecified atrial fibrillation: Secondary | ICD-10-CM | POA: Diagnosis not present

## 2018-04-04 DIAGNOSIS — Z5181 Encounter for therapeutic drug level monitoring: Secondary | ICD-10-CM

## 2018-04-04 LAB — POCT INR: INR: 2.1 (ref 2.0–3.0)

## 2018-04-04 NOTE — Patient Instructions (Signed)
Continue coumadin 1 tablet daily Recheck in 6 weeks

## 2018-04-10 ENCOUNTER — Other Ambulatory Visit: Payer: Self-pay | Admitting: Cardiovascular Disease

## 2018-04-16 DIAGNOSIS — R262 Difficulty in walking, not elsewhere classified: Secondary | ICD-10-CM | POA: Diagnosis not present

## 2018-04-16 DIAGNOSIS — M545 Low back pain: Secondary | ICD-10-CM | POA: Diagnosis not present

## 2018-05-16 ENCOUNTER — Ambulatory Visit (INDEPENDENT_AMBULATORY_CARE_PROVIDER_SITE_OTHER): Payer: PPO | Admitting: *Deleted

## 2018-05-16 DIAGNOSIS — Z5181 Encounter for therapeutic drug level monitoring: Secondary | ICD-10-CM | POA: Diagnosis not present

## 2018-05-16 DIAGNOSIS — I4891 Unspecified atrial fibrillation: Secondary | ICD-10-CM

## 2018-05-16 DIAGNOSIS — R262 Difficulty in walking, not elsewhere classified: Secondary | ICD-10-CM | POA: Diagnosis not present

## 2018-05-16 DIAGNOSIS — M545 Low back pain: Secondary | ICD-10-CM | POA: Diagnosis not present

## 2018-05-16 LAB — POCT INR: INR: 1.7 — AB (ref 2.0–3.0)

## 2018-05-16 NOTE — Patient Instructions (Signed)
Take coumadin 2 tablets tonight then resume 1 tablet daily Recheck in 3 weeks

## 2018-05-21 DIAGNOSIS — N400 Enlarged prostate without lower urinary tract symptoms: Secondary | ICD-10-CM | POA: Diagnosis not present

## 2018-05-21 DIAGNOSIS — R0789 Other chest pain: Secondary | ICD-10-CM | POA: Diagnosis not present

## 2018-05-21 DIAGNOSIS — Z7901 Long term (current) use of anticoagulants: Secondary | ICD-10-CM | POA: Diagnosis not present

## 2018-05-21 DIAGNOSIS — R079 Chest pain, unspecified: Secondary | ICD-10-CM | POA: Diagnosis not present

## 2018-05-21 DIAGNOSIS — I4891 Unspecified atrial fibrillation: Secondary | ICD-10-CM | POA: Diagnosis not present

## 2018-05-21 DIAGNOSIS — E039 Hypothyroidism, unspecified: Secondary | ICD-10-CM | POA: Diagnosis not present

## 2018-05-21 DIAGNOSIS — Z79899 Other long term (current) drug therapy: Secondary | ICD-10-CM | POA: Diagnosis not present

## 2018-05-27 DIAGNOSIS — Z6824 Body mass index (BMI) 24.0-24.9, adult: Secondary | ICD-10-CM | POA: Diagnosis not present

## 2018-05-27 DIAGNOSIS — K210 Gastro-esophageal reflux disease with esophagitis: Secondary | ICD-10-CM | POA: Diagnosis not present

## 2018-05-27 DIAGNOSIS — R5383 Other fatigue: Secondary | ICD-10-CM | POA: Diagnosis not present

## 2018-06-06 ENCOUNTER — Ambulatory Visit (INDEPENDENT_AMBULATORY_CARE_PROVIDER_SITE_OTHER): Payer: PPO | Admitting: *Deleted

## 2018-06-06 DIAGNOSIS — Z5181 Encounter for therapeutic drug level monitoring: Secondary | ICD-10-CM

## 2018-06-06 DIAGNOSIS — I4891 Unspecified atrial fibrillation: Secondary | ICD-10-CM

## 2018-06-06 LAB — POCT INR: INR: 2 (ref 2.0–3.0)

## 2018-06-06 NOTE — Patient Instructions (Signed)
Description   Continue taking  1 tablet daily.  Recheck in 4 weeks.

## 2018-06-10 DIAGNOSIS — A084 Viral intestinal infection, unspecified: Secondary | ICD-10-CM | POA: Diagnosis not present

## 2018-06-10 DIAGNOSIS — R110 Nausea: Secondary | ICD-10-CM | POA: Diagnosis not present

## 2018-06-10 DIAGNOSIS — Z6824 Body mass index (BMI) 24.0-24.9, adult: Secondary | ICD-10-CM | POA: Diagnosis not present

## 2018-06-24 DIAGNOSIS — L570 Actinic keratosis: Secondary | ICD-10-CM | POA: Diagnosis not present

## 2018-07-04 DIAGNOSIS — Z23 Encounter for immunization: Secondary | ICD-10-CM | POA: Diagnosis not present

## 2018-07-11 ENCOUNTER — Ambulatory Visit (INDEPENDENT_AMBULATORY_CARE_PROVIDER_SITE_OTHER): Payer: PPO | Admitting: *Deleted

## 2018-07-11 DIAGNOSIS — Z5181 Encounter for therapeutic drug level monitoring: Secondary | ICD-10-CM | POA: Diagnosis not present

## 2018-07-11 DIAGNOSIS — I4891 Unspecified atrial fibrillation: Secondary | ICD-10-CM | POA: Diagnosis not present

## 2018-07-11 LAB — POCT INR: INR: 1.8 — AB (ref 2.0–3.0)

## 2018-07-11 NOTE — Patient Instructions (Signed)
Increase coumadin to 1 tablet daily except 2 tablets on Thursdays Recheck in 3 weeks.

## 2018-07-18 ENCOUNTER — Telehealth: Payer: Self-pay | Admitting: Cardiovascular Disease

## 2018-07-18 NOTE — Telephone Encounter (Signed)
Spoke with patient and advised amox ok with coumadin and to continue Coumadin as prescribed. He also states that he may be having a tooth pulled today. He will call back if the doctor will not pull it today due to warfarin.

## 2018-07-18 NOTE — Telephone Encounter (Signed)
° ° °  Patient has to have tooth extracted they prescribed him amoxicillin 866m once every 12 hours.  Would like to know if this will affect his coumadin

## 2018-08-01 ENCOUNTER — Ambulatory Visit (INDEPENDENT_AMBULATORY_CARE_PROVIDER_SITE_OTHER): Payer: PPO | Admitting: *Deleted

## 2018-08-01 DIAGNOSIS — I4891 Unspecified atrial fibrillation: Secondary | ICD-10-CM | POA: Diagnosis not present

## 2018-08-01 DIAGNOSIS — Z5181 Encounter for therapeutic drug level monitoring: Secondary | ICD-10-CM

## 2018-08-01 LAB — POCT INR: INR: 3.2 — AB (ref 2.0–3.0)

## 2018-08-01 NOTE — Patient Instructions (Signed)
Hold coumadin tonight then resume 1 tablet daily except 2 tablets on Thursdays Recheck in 2 weeks.  Scheduled for dental extraction tomorrow.  Faxed results to Dr Caesar Bookman office for review. [phone]

## 2018-08-04 DIAGNOSIS — I4891 Unspecified atrial fibrillation: Secondary | ICD-10-CM | POA: Diagnosis not present

## 2018-08-04 DIAGNOSIS — R531 Weakness: Secondary | ICD-10-CM | POA: Diagnosis not present

## 2018-08-04 DIAGNOSIS — R42 Dizziness and giddiness: Secondary | ICD-10-CM | POA: Diagnosis not present

## 2018-08-04 DIAGNOSIS — Z79899 Other long term (current) drug therapy: Secondary | ICD-10-CM | POA: Diagnosis not present

## 2018-08-04 DIAGNOSIS — Z7901 Long term (current) use of anticoagulants: Secondary | ICD-10-CM | POA: Diagnosis not present

## 2018-08-04 DIAGNOSIS — R1110 Vomiting, unspecified: Secondary | ICD-10-CM | POA: Diagnosis not present

## 2018-08-04 DIAGNOSIS — K219 Gastro-esophageal reflux disease without esophagitis: Secondary | ICD-10-CM | POA: Diagnosis not present

## 2018-08-04 DIAGNOSIS — R197 Diarrhea, unspecified: Secondary | ICD-10-CM | POA: Diagnosis not present

## 2018-08-04 DIAGNOSIS — I639 Cerebral infarction, unspecified: Secondary | ICD-10-CM | POA: Diagnosis not present

## 2018-08-04 DIAGNOSIS — E039 Hypothyroidism, unspecified: Secondary | ICD-10-CM | POA: Diagnosis not present

## 2018-08-04 DIAGNOSIS — G450 Vertebro-basilar artery syndrome: Secondary | ICD-10-CM | POA: Diagnosis not present

## 2018-08-04 DIAGNOSIS — Z98818 Other dental procedure status: Secondary | ICD-10-CM | POA: Diagnosis not present

## 2018-08-05 DIAGNOSIS — N400 Enlarged prostate without lower urinary tract symptoms: Secondary | ICD-10-CM | POA: Diagnosis not present

## 2018-08-05 DIAGNOSIS — N281 Cyst of kidney, acquired: Secondary | ICD-10-CM | POA: Diagnosis not present

## 2018-08-05 DIAGNOSIS — Z4682 Encounter for fitting and adjustment of non-vascular catheter: Secondary | ICD-10-CM | POA: Diagnosis not present

## 2018-08-05 DIAGNOSIS — N179 Acute kidney failure, unspecified: Secondary | ICD-10-CM | POA: Diagnosis not present

## 2018-08-05 DIAGNOSIS — R946 Abnormal results of thyroid function studies: Secondary | ICD-10-CM | POA: Diagnosis not present

## 2018-08-05 DIAGNOSIS — K56609 Unspecified intestinal obstruction, unspecified as to partial versus complete obstruction: Secondary | ICD-10-CM | POA: Diagnosis not present

## 2018-08-05 DIAGNOSIS — Z79899 Other long term (current) drug therapy: Secondary | ICD-10-CM | POA: Diagnosis not present

## 2018-08-05 DIAGNOSIS — I4891 Unspecified atrial fibrillation: Secondary | ICD-10-CM | POA: Diagnosis not present

## 2018-08-05 DIAGNOSIS — K219 Gastro-esophageal reflux disease without esophagitis: Secondary | ICD-10-CM | POA: Diagnosis not present

## 2018-08-05 DIAGNOSIS — R1084 Generalized abdominal pain: Secondary | ICD-10-CM | POA: Diagnosis not present

## 2018-08-05 DIAGNOSIS — K5730 Diverticulosis of large intestine without perforation or abscess without bleeding: Secondary | ICD-10-CM | POA: Diagnosis not present

## 2018-08-05 DIAGNOSIS — R531 Weakness: Secondary | ICD-10-CM | POA: Diagnosis not present

## 2018-08-05 DIAGNOSIS — I351 Nonrheumatic aortic (valve) insufficiency: Secondary | ICD-10-CM | POA: Diagnosis not present

## 2018-08-05 DIAGNOSIS — I482 Chronic atrial fibrillation, unspecified: Secondary | ICD-10-CM | POA: Diagnosis not present

## 2018-08-05 DIAGNOSIS — I517 Cardiomegaly: Secondary | ICD-10-CM | POA: Diagnosis not present

## 2018-08-05 DIAGNOSIS — E7800 Pure hypercholesterolemia, unspecified: Secondary | ICD-10-CM | POA: Diagnosis not present

## 2018-08-05 DIAGNOSIS — R42 Dizziness and giddiness: Secondary | ICD-10-CM | POA: Diagnosis not present

## 2018-08-05 DIAGNOSIS — Y95 Nosocomial condition: Secondary | ICD-10-CM | POA: Diagnosis not present

## 2018-08-05 DIAGNOSIS — K6389 Other specified diseases of intestine: Secondary | ICD-10-CM | POA: Diagnosis not present

## 2018-08-05 DIAGNOSIS — R930 Abnormal findings on diagnostic imaging of skull and head, not elsewhere classified: Secondary | ICD-10-CM | POA: Diagnosis not present

## 2018-08-05 DIAGNOSIS — I361 Nonrheumatic tricuspid (valve) insufficiency: Secondary | ICD-10-CM | POA: Diagnosis not present

## 2018-08-05 DIAGNOSIS — E860 Dehydration: Secondary | ICD-10-CM | POA: Diagnosis not present

## 2018-08-05 DIAGNOSIS — R5381 Other malaise: Secondary | ICD-10-CM | POA: Diagnosis not present

## 2018-08-05 DIAGNOSIS — E785 Hyperlipidemia, unspecified: Secondary | ICD-10-CM | POA: Diagnosis not present

## 2018-08-05 DIAGNOSIS — R05 Cough: Secondary | ICD-10-CM | POA: Diagnosis not present

## 2018-08-05 DIAGNOSIS — I4811 Longstanding persistent atrial fibrillation: Secondary | ICD-10-CM | POA: Diagnosis not present

## 2018-08-05 DIAGNOSIS — J9811 Atelectasis: Secondary | ICD-10-CM | POA: Diagnosis not present

## 2018-08-05 DIAGNOSIS — D508 Other iron deficiency anemias: Secondary | ICD-10-CM | POA: Diagnosis not present

## 2018-08-05 DIAGNOSIS — I340 Nonrheumatic mitral (valve) insufficiency: Secondary | ICD-10-CM | POA: Diagnosis not present

## 2018-08-05 DIAGNOSIS — I6781 Acute cerebrovascular insufficiency: Secondary | ICD-10-CM | POA: Diagnosis not present

## 2018-08-05 DIAGNOSIS — K56600 Partial intestinal obstruction, unspecified as to cause: Secondary | ICD-10-CM | POA: Diagnosis not present

## 2018-08-05 DIAGNOSIS — I672 Cerebral atherosclerosis: Secondary | ICD-10-CM | POA: Diagnosis not present

## 2018-08-05 DIAGNOSIS — J984 Other disorders of lung: Secondary | ICD-10-CM | POA: Diagnosis not present

## 2018-08-05 DIAGNOSIS — B9789 Other viral agents as the cause of diseases classified elsewhere: Secondary | ICD-10-CM | POA: Diagnosis not present

## 2018-08-05 DIAGNOSIS — M6281 Muscle weakness (generalized): Secondary | ICD-10-CM | POA: Diagnosis not present

## 2018-08-05 DIAGNOSIS — J9601 Acute respiratory failure with hypoxia: Secondary | ICD-10-CM | POA: Diagnosis not present

## 2018-08-05 DIAGNOSIS — B348 Other viral infections of unspecified site: Secondary | ICD-10-CM | POA: Diagnosis not present

## 2018-08-05 DIAGNOSIS — E876 Hypokalemia: Secondary | ICD-10-CM | POA: Diagnosis not present

## 2018-08-05 DIAGNOSIS — Z7901 Long term (current) use of anticoagulants: Secondary | ICD-10-CM | POA: Diagnosis not present

## 2018-08-05 DIAGNOSIS — I639 Cerebral infarction, unspecified: Secondary | ICD-10-CM | POA: Diagnosis not present

## 2018-08-05 DIAGNOSIS — R112 Nausea with vomiting, unspecified: Secondary | ICD-10-CM | POA: Diagnosis not present

## 2018-08-05 DIAGNOSIS — J189 Pneumonia, unspecified organism: Secondary | ICD-10-CM | POA: Diagnosis not present

## 2018-08-05 DIAGNOSIS — K567 Ileus, unspecified: Secondary | ICD-10-CM | POA: Diagnosis not present

## 2018-08-05 DIAGNOSIS — D649 Anemia, unspecified: Secondary | ICD-10-CM | POA: Diagnosis not present

## 2018-08-05 DIAGNOSIS — R109 Unspecified abdominal pain: Secondary | ICD-10-CM | POA: Diagnosis not present

## 2018-08-05 DIAGNOSIS — E039 Hypothyroidism, unspecified: Secondary | ICD-10-CM | POA: Diagnosis not present

## 2018-08-05 DIAGNOSIS — J069 Acute upper respiratory infection, unspecified: Secondary | ICD-10-CM | POA: Diagnosis not present

## 2018-08-05 DIAGNOSIS — D509 Iron deficiency anemia, unspecified: Secondary | ICD-10-CM | POA: Diagnosis not present

## 2018-08-05 DIAGNOSIS — J90 Pleural effusion, not elsewhere classified: Secondary | ICD-10-CM | POA: Diagnosis not present

## 2018-08-21 ENCOUNTER — Other Ambulatory Visit: Payer: Self-pay

## 2018-08-21 NOTE — Patient Outreach (Signed)
[facility]) Care Management  08/21/2018  ORRY SIGL [birth year] [id number]   Referral received. No outreach warranted at this time. Transition of Care  will be completed by primary care provider office who will refer to [facility] care management if needed.  Plan: RN CM will close case.  [name], RN, MSN [place] Management Care Management Coordinator Direct Line [phone] Cell [phone] Toll Free: [phone]  Fax: [phone]

## 2018-08-26 DIAGNOSIS — E785 Hyperlipidemia, unspecified: Secondary | ICD-10-CM | POA: Diagnosis not present

## 2018-08-26 DIAGNOSIS — N400 Enlarged prostate without lower urinary tract symptoms: Secondary | ICD-10-CM | POA: Diagnosis not present

## 2018-08-26 DIAGNOSIS — J189 Pneumonia, unspecified organism: Secondary | ICD-10-CM | POA: Diagnosis not present

## 2018-08-26 DIAGNOSIS — D508 Other iron deficiency anemias: Secondary | ICD-10-CM | POA: Diagnosis not present

## 2018-08-26 DIAGNOSIS — K219 Gastro-esophageal reflux disease without esophagitis: Secondary | ICD-10-CM | POA: Diagnosis not present

## 2018-08-26 DIAGNOSIS — Z9181 History of falling: Secondary | ICD-10-CM | POA: Diagnosis not present

## 2018-08-26 DIAGNOSIS — Z792 Long term (current) use of antibiotics: Secondary | ICD-10-CM | POA: Diagnosis not present

## 2018-08-26 DIAGNOSIS — Z7901 Long term (current) use of anticoagulants: Secondary | ICD-10-CM | POA: Diagnosis not present

## 2018-08-26 DIAGNOSIS — Z7952 Long term (current) use of systemic steroids: Secondary | ICD-10-CM | POA: Diagnosis not present

## 2018-08-26 DIAGNOSIS — M519 Unspecified thoracic, thoracolumbar and lumbosacral intervertebral disc disorder: Secondary | ICD-10-CM | POA: Diagnosis not present

## 2018-08-26 DIAGNOSIS — I482 Chronic atrial fibrillation, unspecified: Secondary | ICD-10-CM | POA: Diagnosis not present

## 2018-08-27 ENCOUNTER — Telehealth: Payer: Self-pay | Admitting: Cardiovascular Disease

## 2018-08-27 NOTE — Telephone Encounter (Signed)
Patient called stating that he had been inpatient at [facility] recently. Requested to speak with Edrick Oh, RN in regards to his coumdin.

## 2018-08-27 NOTE — Telephone Encounter (Signed)
Has been in [facility] x 18 days.  Ruled out for a stroke.  Developed viral and bacterial  pneumonia.  Sent home on prednisone 35m daily thru 11/13 and levaquin 1 tablet day thru 11/13.  D/C INR on 11/8 was 2.5.  He was told to hold coumadin x 2 days and restart on 08/25/18.  Wants to know if he needs a coumadin check.  In agreement.  Appt made for 08/29/18.

## 2018-08-29 ENCOUNTER — Ambulatory Visit (INDEPENDENT_AMBULATORY_CARE_PROVIDER_SITE_OTHER): Payer: PPO | Admitting: *Deleted

## 2018-08-29 DIAGNOSIS — I4891 Unspecified atrial fibrillation: Secondary | ICD-10-CM

## 2018-08-29 DIAGNOSIS — Z5181 Encounter for therapeutic drug level monitoring: Secondary | ICD-10-CM

## 2018-08-29 LAB — POCT INR: INR: 1.6 — AB (ref 2.0–3.0)

## 2018-08-29 NOTE — Patient Instructions (Signed)
Take coumadin 2 tablets tonight and tomorrow night then resume 1 tablet daily except 2 tablets on Thursdays Recheck in 2 weeks.

## 2018-09-04 ENCOUNTER — Ambulatory Visit: Payer: PPO | Admitting: Cardiovascular Disease

## 2018-09-05 DIAGNOSIS — E039 Hypothyroidism, unspecified: Secondary | ICD-10-CM | POA: Diagnosis not present

## 2018-09-05 DIAGNOSIS — R2689 Other abnormalities of gait and mobility: Secondary | ICD-10-CM | POA: Diagnosis not present

## 2018-09-05 DIAGNOSIS — K210 Gastro-esophageal reflux disease with esophagitis: Secondary | ICD-10-CM | POA: Diagnosis not present

## 2018-09-05 DIAGNOSIS — J189 Pneumonia, unspecified organism: Secondary | ICD-10-CM | POA: Diagnosis not present

## 2018-09-05 DIAGNOSIS — E7800 Pure hypercholesterolemia, unspecified: Secondary | ICD-10-CM | POA: Diagnosis not present

## 2018-09-05 DIAGNOSIS — N183 Chronic kidney disease, stage 3 (moderate): Secondary | ICD-10-CM | POA: Diagnosis not present

## 2018-09-05 DIAGNOSIS — G4709 Other insomnia: Secondary | ICD-10-CM | POA: Diagnosis not present

## 2018-09-05 DIAGNOSIS — M545 Low back pain: Secondary | ICD-10-CM | POA: Diagnosis not present

## 2018-09-05 DIAGNOSIS — I482 Chronic atrial fibrillation, unspecified: Secondary | ICD-10-CM | POA: Diagnosis not present

## 2018-09-05 DIAGNOSIS — I10 Essential (primary) hypertension: Secondary | ICD-10-CM | POA: Diagnosis not present

## 2018-09-05 DIAGNOSIS — Z6824 Body mass index (BMI) 24.0-24.9, adult: Secondary | ICD-10-CM | POA: Diagnosis not present

## 2018-09-05 DIAGNOSIS — D509 Iron deficiency anemia, unspecified: Secondary | ICD-10-CM | POA: Diagnosis not present

## 2018-09-05 DIAGNOSIS — R7301 Impaired fasting glucose: Secondary | ICD-10-CM | POA: Diagnosis not present

## 2018-09-05 DIAGNOSIS — I4891 Unspecified atrial fibrillation: Secondary | ICD-10-CM | POA: Diagnosis not present

## 2018-09-10 ENCOUNTER — Ambulatory Visit (INDEPENDENT_AMBULATORY_CARE_PROVIDER_SITE_OTHER): Payer: PPO | Admitting: *Deleted

## 2018-09-10 DIAGNOSIS — Z5181 Encounter for therapeutic drug level monitoring: Secondary | ICD-10-CM

## 2018-09-10 DIAGNOSIS — I4891 Unspecified atrial fibrillation: Secondary | ICD-10-CM | POA: Diagnosis not present

## 2018-09-10 LAB — POCT INR: INR: 2.4 (ref 2.0–3.0)

## 2018-09-10 NOTE — Patient Instructions (Signed)
Continue coumadin 1 tablet daily except 2 tablets on Thursdays Recheck in 4 weeks.

## 2018-09-23 DIAGNOSIS — R5383 Other fatigue: Secondary | ICD-10-CM | POA: Diagnosis not present

## 2018-09-23 DIAGNOSIS — K210 Gastro-esophageal reflux disease with esophagitis: Secondary | ICD-10-CM | POA: Diagnosis not present

## 2018-09-23 DIAGNOSIS — N183 Chronic kidney disease, stage 3 (moderate): Secondary | ICD-10-CM | POA: Diagnosis not present

## 2018-09-23 DIAGNOSIS — I10 Essential (primary) hypertension: Secondary | ICD-10-CM | POA: Diagnosis not present

## 2018-09-23 DIAGNOSIS — E7800 Pure hypercholesterolemia, unspecified: Secondary | ICD-10-CM | POA: Diagnosis not present

## 2018-09-23 DIAGNOSIS — R7301 Impaired fasting glucose: Secondary | ICD-10-CM | POA: Diagnosis not present

## 2018-09-23 DIAGNOSIS — E039 Hypothyroidism, unspecified: Secondary | ICD-10-CM | POA: Diagnosis not present

## 2018-09-27 DIAGNOSIS — E039 Hypothyroidism, unspecified: Secondary | ICD-10-CM | POA: Diagnosis not present

## 2018-09-27 DIAGNOSIS — J189 Pneumonia, unspecified organism: Secondary | ICD-10-CM | POA: Diagnosis not present

## 2018-09-27 DIAGNOSIS — Z6825 Body mass index (BMI) 25.0-25.9, adult: Secondary | ICD-10-CM | POA: Diagnosis not present

## 2018-09-27 DIAGNOSIS — I10 Essential (primary) hypertension: Secondary | ICD-10-CM | POA: Diagnosis not present

## 2018-09-27 DIAGNOSIS — E7800 Pure hypercholesterolemia, unspecified: Secondary | ICD-10-CM | POA: Diagnosis not present

## 2018-09-27 DIAGNOSIS — I4891 Unspecified atrial fibrillation: Secondary | ICD-10-CM | POA: Diagnosis not present

## 2018-09-27 DIAGNOSIS — N183 Chronic kidney disease, stage 3 (moderate): Secondary | ICD-10-CM | POA: Diagnosis not present

## 2018-10-02 DIAGNOSIS — Z792 Long term (current) use of antibiotics: Secondary | ICD-10-CM | POA: Diagnosis not present

## 2018-10-02 DIAGNOSIS — I482 Chronic atrial fibrillation, unspecified: Secondary | ICD-10-CM | POA: Diagnosis not present

## 2018-10-02 DIAGNOSIS — D508 Other iron deficiency anemias: Secondary | ICD-10-CM | POA: Diagnosis not present

## 2018-10-02 DIAGNOSIS — J189 Pneumonia, unspecified organism: Secondary | ICD-10-CM | POA: Diagnosis not present

## 2018-10-02 DIAGNOSIS — N400 Enlarged prostate without lower urinary tract symptoms: Secondary | ICD-10-CM | POA: Diagnosis not present

## 2018-10-02 DIAGNOSIS — E785 Hyperlipidemia, unspecified: Secondary | ICD-10-CM | POA: Diagnosis not present

## 2018-10-02 DIAGNOSIS — M519 Unspecified thoracic, thoracolumbar and lumbosacral intervertebral disc disorder: Secondary | ICD-10-CM | POA: Diagnosis not present

## 2018-10-02 DIAGNOSIS — K219 Gastro-esophageal reflux disease without esophagitis: Secondary | ICD-10-CM | POA: Diagnosis not present

## 2018-10-08 ENCOUNTER — Ambulatory Visit (INDEPENDENT_AMBULATORY_CARE_PROVIDER_SITE_OTHER): Payer: PPO | Admitting: *Deleted

## 2018-10-08 DIAGNOSIS — Z5181 Encounter for therapeutic drug level monitoring: Secondary | ICD-10-CM

## 2018-10-08 DIAGNOSIS — I4891 Unspecified atrial fibrillation: Secondary | ICD-10-CM | POA: Diagnosis not present

## 2018-10-08 LAB — POCT INR: INR: 2.7 (ref 2.0–3.0)

## 2018-10-08 NOTE — Patient Instructions (Signed)
Continue coumadin 1 tablet daily except 2 tablets on Thursdays Recheck in 4 weeks.

## 2018-10-12 ENCOUNTER — Other Ambulatory Visit: Payer: Self-pay | Admitting: Cardiovascular Disease

## 2018-10-18 ENCOUNTER — Telehealth: Payer: Self-pay | Admitting: Cardiovascular Disease

## 2018-10-18 ENCOUNTER — Telehealth: Payer: Self-pay

## 2018-10-18 DIAGNOSIS — R609 Edema, unspecified: Secondary | ICD-10-CM | POA: Diagnosis not present

## 2018-10-18 DIAGNOSIS — M1990 Unspecified osteoarthritis, unspecified site: Secondary | ICD-10-CM | POA: Diagnosis not present

## 2018-10-18 DIAGNOSIS — R0602 Shortness of breath: Secondary | ICD-10-CM

## 2018-10-18 DIAGNOSIS — Z6825 Body mass index (BMI) 25.0-25.9, adult: Secondary | ICD-10-CM | POA: Diagnosis not present

## 2018-10-18 DIAGNOSIS — I10 Essential (primary) hypertension: Secondary | ICD-10-CM | POA: Diagnosis not present

## 2018-10-18 DIAGNOSIS — I2510 Atherosclerotic heart disease of native coronary artery without angina pectoris: Secondary | ICD-10-CM | POA: Diagnosis not present

## 2018-10-18 NOTE — Telephone Encounter (Signed)
I called Dr.[name]'s office, told no chest x-ray report available yet by front desk staff.   Dr Edythe Lynn nurse says patient was told to get an apt to see Dr.Koneswaran. They are going to speak with Dr.[name] and call the [facility] office to schedule echocardiogram

## 2018-10-18 NOTE — Telephone Encounter (Signed)
Duplicate note see other phone note from today

## 2018-10-18 NOTE — Telephone Encounter (Signed)
Pt c/o swelling/SOB/weakness since last Tuesday - says gained 6-10lbs in the last 3 weeks - went to see Dr Quintin Alto and was told he should contact us for an echocardiogram and that CXR showed heart may be enlarged - spoke with Dr Franchot Gallo office who says he was told to contact us and they did do CXR today (will forward records when available) - will forward to provider

## 2018-10-18 NOTE — Telephone Encounter (Signed)
Patient called [place] office to say he saw Dr Quintin Alto today, had cxr , and was told heart was slightly enlarged.Dr Quintin Alto wants echocardiogram per patient. I will forward to triage pool in [place]

## 2018-10-18 NOTE — Telephone Encounter (Signed)
Dr Edythe Lynn  advised patient to call [facility] to schedule Echo for no later than next week due to thinking he was in Heart Failure.  Patient experiencing SOB, swelling. Patient stated Dr Quintin Alto ordered a chest Xray that he had done today that showed enlarged heart, but results are not back yet.

## 2018-10-18 NOTE — Telephone Encounter (Signed)
Let us obtain an echocardiogram.  He will need a clinical evaluation in the office.  Please schedule him to see a PA next week.

## 2018-10-18 NOTE — Telephone Encounter (Signed)
Pre-cert Verification for the following procedure   Echo scheduled for 10/22/2018 at [facility]

## 2018-10-18 NOTE — Telephone Encounter (Signed)
Pt aware - orders placed and will forward to schedulers for echo and extender appt

## 2018-10-22 ENCOUNTER — Ambulatory Visit (HOSPITAL_COMMUNITY)
Admission: RE | Admit: 2018-10-22 | Discharge: 2018-10-22 | Disposition: A | Discharge status: Home / Self Care | Payer: PPO | Source: Ambulatory Visit | Attending: Cardiovascular Disease | Admitting: Cardiovascular Disease

## 2018-10-22 DIAGNOSIS — R0602 Shortness of breath: Secondary | ICD-10-CM | POA: Diagnosis not present

## 2018-10-22 NOTE — Progress Notes (Signed)
*  PRELIMINARY RESULTS* Echocardiogram 2D Echocardiogram has been performed.  [name] 10/22/2018, 10:21 AM

## 2018-10-24 ENCOUNTER — Telehealth: Payer: Self-pay | Admitting: Cardiovascular Disease

## 2018-10-24 NOTE — Telephone Encounter (Signed)
Patient is calling back in regards to previous conversation in regards to instructions from [place]. / tg

## 2018-10-24 NOTE — Telephone Encounter (Signed)
Patient will talk with Dr Quintin Alto about possible change in his medications as his legs are swollen and the echo did not show heart failure

## 2018-10-31 ENCOUNTER — Telehealth: Payer: Self-pay | Admitting: Cardiovascular Disease

## 2018-10-31 NOTE — Telephone Encounter (Signed)
Opened in error

## 2018-11-04 ENCOUNTER — Ambulatory Visit (INDEPENDENT_AMBULATORY_CARE_PROVIDER_SITE_OTHER): Payer: PPO | Admitting: Cardiovascular Disease

## 2018-11-04 ENCOUNTER — Encounter: Payer: Self-pay | Admitting: Cardiovascular Disease

## 2018-11-04 VITALS — BP 130/78 | HR 88 | Ht 68.0 in | Wt 166.0 lb

## 2018-11-04 DIAGNOSIS — I5032 Chronic diastolic (congestive) heart failure: Secondary | ICD-10-CM | POA: Diagnosis not present

## 2018-11-04 DIAGNOSIS — I25118 Atherosclerotic heart disease of native coronary artery with other forms of angina pectoris: Secondary | ICD-10-CM | POA: Diagnosis not present

## 2018-11-04 DIAGNOSIS — K219 Gastro-esophageal reflux disease without esophagitis: Secondary | ICD-10-CM | POA: Diagnosis not present

## 2018-11-04 DIAGNOSIS — I4821 Permanent atrial fibrillation: Secondary | ICD-10-CM | POA: Diagnosis not present

## 2018-11-04 DIAGNOSIS — Z7901 Long term (current) use of anticoagulants: Secondary | ICD-10-CM

## 2018-11-04 DIAGNOSIS — Z5181 Encounter for therapeutic drug level monitoring: Secondary | ICD-10-CM

## 2018-11-04 DIAGNOSIS — Z9289 Personal history of other medical treatment: Secondary | ICD-10-CM

## 2018-11-04 DIAGNOSIS — R0602 Shortness of breath: Secondary | ICD-10-CM | POA: Diagnosis not present

## 2018-11-04 MED ORDER — POTASSIUM CHLORIDE CRYS ER 20 MEQ PO TBCR: 20.0000 meq | EXTENDED_RELEASE_TABLET | Freq: Every day | ORAL | 6 refills | Status: DC

## 2018-11-04 MED ORDER — FUROSEMIDE 20 MG PO TABS: 20.0000 mg | ORAL_TABLET | Freq: Every day | ORAL | 6 refills | Status: DC

## 2018-11-04 NOTE — Patient Instructions (Signed)
Medication Instructions:   Begin Lasix 61m daily.  Begin Potassium 223m daily.  Continue all other medications.    Labwork:  BMET - order given today.  Please do this Friday.   Office will contact with results via phone or letter.    Testing/Procedures: none  Follow-Up: 3 months   Any Other Special Instructions Will Be Listed Below (If Applicable).  If you need a refill on your cardiac medications before your next appointment, please call your pharmacy.

## 2018-11-04 NOTE — Progress Notes (Signed)
SUBJECTIVE: The patient presents for routine follow-up. He underwent a low risk nuclear stress test in October 2018 which demonstrated prior inferoseptal myocardial infarction without ischemia.  He has coronary artery disease, permanent atrial fibrillation, and GERD.  Echocardiogram on 10/22/2018 showed normal left ventricular systolic function and regional wall motion, LVEF 55%.  There was mild left atrial dilatation.  He is here with his wife.  She brought in a printed out history of all that has happened since October.  He apparently underwent tooth extraction and his blood pressure became elevated and he became nauseous and started vomiting.  He was taken to the ED at [facility] ER and it was initially thought that he had a stroke and was transferred to [facility].  He did not have a stroke apparently.  He was found to have lower intestinal blockage and required an NG tube.  He became tachycardic there.  He developed pneumonia.  He was hospitalized for a total of 18 days.  He then developed ankle and feet swelling and was told by his PCP to take Lasix 40 mg for weights greater than 162 pounds.  His weights have fluctuated quite a bit.  They avoid sodium altogether.  He currently denies chest pain but does have some exertional dyspnea.  He denies orthopnea and paroxysmal nocturnal dyspnea.  I reviewed labs dated 09/23/2018: LDL 60, total cholesterol 121, triglycerides 112, HDL 39, hemoglobin 13.6, platelets 209, sodium 144, potassium 4.3, BUN 12, creatinine 1.23.  Albumin was normal at 3.8 on 08/06/2018.  TSH was mildly elevated at 6.29 on 08/05/2018.  They have been married for 63 years.  I reviewed the chest x-ray dated 08/10/2018 on the EMR which showed bilateral lower lobe infiltrates and small bilateral pleural effusions.  An echocardiogram was performed in October 2019 while hospitalized which I reviewed and also demonstrated normal left ventricular systolic function.  Head CT on  08/05/2018 showed old small cortical and subcortical infarctions in the anterior, inferolateral left parietal lobe, including part of the left parietal operculum.  There were no acute intracranial abnormalities.  CT angiography showed no hemodynamically significant stenosis, dissection, or aneurysms in the extracranial circulation nor intracranial circulation.   Review of Systems: As per "subjective", otherwise negative.  No Known Allergies  Current Outpatient Medications  Medication Sig Dispense Refill  . acetaminophen (TYLENOL) 500 MG tablet Take 500 mg by mouth every 6 (six) hours as needed.    . carvedilol (COREG) 6.25 MG tablet TAKE ONE TABLET BY MOUTH TWICE DAILY. 60 tablet 6  . diltiazem (CARDIZEM CD) 180 MG 24 hr capsule Take 180 mg by mouth 2 (two) times daily.     Marland Kitchen levothyroxine (SYNTHROID, LEVOTHROID) 75 MCG tablet Take 75 mcg by mouth daily before breakfast.    . magnesium gluconate (MAGONATE) 500 MG tablet Take 500 mg by mouth 2 (two) times daily.     . Melatonin 10 MG TABS Take by mouth.    . nitroGLYCERIN (NITROSTAT) 0.4 MG SL tablet Place 0.4 mg under the tongue every 5 (five) minutes as needed. For chest pains. May repeat for up to 3 doses.    . pantoprazole (PROTONIX) 40 MG tablet TAKE ONE TABLET BY MOUTH DAILY. (Patient taking differently: 2 (two) times daily. ) 90 tablet 1  . simvastatin (ZOCOR) 10 MG tablet Take 10 mg by mouth at bedtime.     . Tamsulosin HCl (FLOMAX) 0.4 MG CAPS Take 0.4 mg by mouth every evening.     Marland Kitchen  warfarin (COUMADIN) 2.5 MG tablet Take 2.5 mg by mouth as directed. As directed by coumadin clinic      No current facility-administered medications for this visit.     Past Medical History:  Diagnosis Date  . Atrial fibrillation ([place])    Permanent  . CAD (coronary artery disease)    Catheterization 2004, mild/moderate nonobstructive disease  /   nuclear, 2007, small inferior scar // no ischemia  . Carotid bruit    Doppler, December 23, 2010, no  significant carotid disease  . Clot    LA, small, in past  . Dizziness 03/2010   June, 2011   Mild, stop digoxin, felt better  . Ejection fraction 02/2009   EF 55-60%,Echo, May, 2010  . Elevated PSA   . GERD (gastroesophageal reflux disease)   . LVH (left ventricular hypertrophy) 02/2009    Mild, echo, 2010  . Mitral regurgitation    Mild, echo, flat closure  . Nausea vomiting and diarrhea 01/2010   Hospitalization, probably viral (or partial small bowel obstruction)/ Hospital 09/2010, recurrent possible partial small bowel obstruction, medical therapy  . Other malaise and fatigue   . Warfarin anticoagulation    Atrial fibrillation    Past Surgical History:  Procedure Laterality Date  . AGILE CAPSULE  10/18/2011   Procedure: AGILE CAPSULE;  Surgeon: Rogene Houston, MD;  Location: [facility];  Service: Endoscopy;  Laterality: N/A;  730  . CARDIAC CATHETERIZATION  2004  . CHOLECYSTECTOMY  2010   Dr. Anthony Sar  . COLONOSCOPY  2008   [place]  . COLONOSCOPY N/A 03/23/2016   Procedure: COLONOSCOPY;  Surgeon: Rogene Houston, MD;  Location: [facility];  Service: Endoscopy;  Laterality: N/A;  1:00  . ESOPHAGOGASTRODUODENOSCOPY  11/08/2011   Procedure: ESOPHAGOGASTRODUODENOSCOPY (EGD);  Surgeon: Rogene Houston, MD;  Location: [facility];  Service: Endoscopy;  Laterality: N/A;  300  . POLYPECTOMY  03/23/2016   Procedure: POLYPECTOMY;  Surgeon: Rogene Houston, MD;  Location: [facility];  Service: Endoscopy;;  Cecal polyp removed via cold forceps recto-sigmoid polyp removed via cold snare    Social History   Socioeconomic History  . Marital status: Married    Spouse name: Not on file  . Number of children: Not on file  . Years of education: Not on file  . Highest education level: Not on file  Occupational History  . Occupation: Engineer, agricultural: RETIRED  Social Needs  . Financial resource strain: Not on file  . Food insecurity:    Worry: Not on file     Inability: Not on file  . Transportation needs:    Medical: Not on file    Non-medical: Not on file  Tobacco Use  . Smoking status: Never Smoker  . Smokeless tobacco: Never Used  Substance and Sexual Activity  . Alcohol use: No    Alcohol/week: 0.0 standard drinks  . Drug use: No  . Sexual activity: Not on file  Lifestyle  . Physical activity:    Days per week: Not on file    Minutes per session: Not on file  . Stress: Not on file  Relationships  . Social connections:    Talks on phone: Not on file    Gets together: Not on file    Attends religious service: Not on file    Active member of club or organization: Not on file    Attends meetings of clubs or organizations: Not on file  Relationship status: Not on file  . Intimate partner violence:    Fear of current or ex partner: Not on file    Emotionally abused: Not on file    Physically abused: Not on file    Forced sexual activity: Not on file  Other Topics Concern  . Not on file  Social History Narrative   Married     Vitals:   11/04/18 1536  BP: 130/78  Pulse: 88  SpO2: 98%  Weight: 166 lb (75.3 kg)  Height: 5' 8"  (1.727 m)    Wt Readings from Last 3 Encounters:  11/04/18 166 lb (75.3 kg)  03/18/18 173 lb (78.5 kg)  09/11/17 158 lb (71.7 kg)     PHYSICAL EXAM General: NAD HEENT: Normal. Neck: No JVD, no thyromegaly. Lungs: Clear to auscultation bilaterally with normal respiratory effort. CV: Regular rate and irregular rhythm, normal S1/S2, no S3, no murmur. No pretibial or periankle edema.  No carotid bruit.   Abdomen: Soft, nontender, no distention.  Neurologic: Alert and oriented.  Psych: Normal affect. Skin: Normal. Musculoskeletal: No gross deformities.    ECG: Reviewed above under Subjective   Labs: Lab Results  Component Value Date/Time   CREATININE 1.31 (H) 08/02/2017 10:38 AM   ALT 10 06/15/2017 09:29 AM   HGB 12.8 (L) 06/15/2017 09:31 AM     Lipids: No results found for:  LDLCALC, LDLDIRECT, CHOL, TRIG, HDL     ASSESSMENT AND PLAN:  1.  Permanent atrial fibrillation: Symptomatically stable on long-acting diltiazem 180 mg twice daily. Anticoagulated with warfarin. No changes.  2. Coronary artery disease:Symptomatically stable.  Low risk nuclear stress test in October 2018 as noted above.Continuecarvedilol and simvastatin.No aspirin as he is on warfarin.   3. GERD:Symptomatically stable on Protonix 40 mg daily.I previously instructed him to take it 30-60 minutes before lunch.  4.  Chronic diastolic heart failure: Currently taking Lasix 40 mg as needed.  I will start Lasix 20 mg daily along with potassium chloride 20 mEq daily.  I will check a basic metabolic panel within the next few days.   Disposition: Follow up 3 months  Time spent: 40 minutes, of which greater than 50% was spent reviewing symptoms, relevant blood tests and studies, and discussing management plan with the patient.    Kate Sable, M.D., F.A.C.C.

## 2018-11-05 ENCOUNTER — Ambulatory Visit (INDEPENDENT_AMBULATORY_CARE_PROVIDER_SITE_OTHER): Payer: PPO | Admitting: Pharmacist

## 2018-11-05 DIAGNOSIS — Z5181 Encounter for therapeutic drug level monitoring: Secondary | ICD-10-CM | POA: Diagnosis not present

## 2018-11-05 DIAGNOSIS — I4891 Unspecified atrial fibrillation: Secondary | ICD-10-CM | POA: Diagnosis not present

## 2018-11-05 LAB — POCT INR: INR: 1.8 — AB (ref 2.0–3.0)

## 2018-11-05 NOTE — Patient Instructions (Signed)
Description   Take an extra tablet today (since already took 1 tablet) then continue coumadin 1 tablet daily except 2 tablets on Thursdays Recheck in 3 weeks.

## 2018-11-08 DIAGNOSIS — I5032 Chronic diastolic (congestive) heart failure: Secondary | ICD-10-CM | POA: Diagnosis not present

## 2018-11-11 DIAGNOSIS — K56699 Other intestinal obstruction unspecified as to partial versus complete obstruction: Secondary | ICD-10-CM | POA: Diagnosis not present

## 2018-11-12 DIAGNOSIS — Z7901 Long term (current) use of anticoagulants: Secondary | ICD-10-CM | POA: Diagnosis not present

## 2018-11-12 DIAGNOSIS — E039 Hypothyroidism, unspecified: Secondary | ICD-10-CM | POA: Diagnosis not present

## 2018-11-12 DIAGNOSIS — Z79899 Other long term (current) drug therapy: Secondary | ICD-10-CM | POA: Diagnosis not present

## 2018-11-12 DIAGNOSIS — Z87442 Personal history of urinary calculi: Secondary | ICD-10-CM | POA: Diagnosis not present

## 2018-11-12 DIAGNOSIS — K56609 Unspecified intestinal obstruction, unspecified as to partial versus complete obstruction: Secondary | ICD-10-CM | POA: Diagnosis not present

## 2018-11-12 DIAGNOSIS — K56699 Other intestinal obstruction unspecified as to partial versus complete obstruction: Secondary | ICD-10-CM | POA: Diagnosis not present

## 2018-11-12 DIAGNOSIS — N179 Acute kidney failure, unspecified: Secondary | ICD-10-CM | POA: Diagnosis not present

## 2018-11-12 DIAGNOSIS — K56600 Partial intestinal obstruction, unspecified as to cause: Secondary | ICD-10-CM | POA: Diagnosis not present

## 2018-11-12 DIAGNOSIS — I4891 Unspecified atrial fibrillation: Secondary | ICD-10-CM | POA: Diagnosis not present

## 2018-11-12 DIAGNOSIS — E860 Dehydration: Secondary | ICD-10-CM | POA: Diagnosis not present

## 2018-11-12 DIAGNOSIS — K219 Gastro-esophageal reflux disease without esophagitis: Secondary | ICD-10-CM | POA: Diagnosis not present

## 2018-11-12 DIAGNOSIS — N400 Enlarged prostate without lower urinary tract symptoms: Secondary | ICD-10-CM | POA: Diagnosis not present

## 2018-11-12 DIAGNOSIS — Z9049 Acquired absence of other specified parts of digestive tract: Secondary | ICD-10-CM | POA: Diagnosis not present

## 2018-11-14 ENCOUNTER — Telehealth: Payer: Self-pay | Admitting: Cardiovascular Disease

## 2018-11-14 DIAGNOSIS — R7989 Other specified abnormal findings of blood chemistry: Secondary | ICD-10-CM

## 2018-11-14 NOTE — Telephone Encounter (Signed)
Returning call.

## 2018-11-15 MED ORDER — FUROSEMIDE 20 MG PO TABS: 20.0000 mg | ORAL_TABLET | ORAL | Status: DC

## 2018-11-15 MED ORDER — POTASSIUM CHLORIDE CRYS ER 20 MEQ PO TBCR: 20.0000 meq | EXTENDED_RELEASE_TABLET | ORAL | Status: DC

## 2018-11-15 NOTE — Telephone Encounter (Signed)
Notes recorded by Laurine Blazer, LPN on 07/04/8021 at 1:79 PM EST Patient notified. Copy to pmd. Follow up scheduled for April with Dr. Paralee Cancel office. He will do his lab at [facility] across from [facility]. Will mail order.   ------  Notes recorded by Laurine Blazer, LPN on 05/26/2547 at 6:28 PM EST Left message to return call.  ------  Notes recorded by Herminio Commons, MD on 11/13/2018 at 9:52 AM EST Renal function slightly worse since December due to diuretic usage. Try switching Lasix and KCl to every other day rather than daily and repeat BMET in 3 weeks.

## 2018-11-15 NOTE — Telephone Encounter (Signed)
Patient calling back again

## 2018-11-19 ENCOUNTER — Other Ambulatory Visit: Payer: Self-pay

## 2018-11-19 NOTE — Patient Outreach (Signed)
[facility]) Care Management  11/19/2018  [name] [birth year] [id number]  Discharge from [facility] 11/14/2018   Referral received. No outreach warranted at this time. Transition of Care  will be completed by primary care provider office who will refer to [facility] care management if needed.  Plan: RN CM will close case.  [name], RN, MSN [place] Management Care Management Coordinator Direct Line [phone] Cell [phone] Toll Free: [phone]  Fax: [phone]

## 2018-11-26 ENCOUNTER — Ambulatory Visit (INDEPENDENT_AMBULATORY_CARE_PROVIDER_SITE_OTHER): Payer: PPO | Admitting: Pharmacist

## 2018-11-26 DIAGNOSIS — Z5181 Encounter for therapeutic drug level monitoring: Secondary | ICD-10-CM

## 2018-11-26 DIAGNOSIS — I4891 Unspecified atrial fibrillation: Secondary | ICD-10-CM

## 2018-11-26 LAB — POCT INR: INR: 2.4 (ref 2.0–3.0)

## 2018-11-26 NOTE — Patient Instructions (Signed)
Description   Continue coumadin 1 tablet daily except 2 tablets on Thursdays Recheck in 4 weeks.

## 2018-11-28 ENCOUNTER — Ambulatory Visit: Payer: PPO | Admitting: Cardiovascular Disease

## 2018-12-10 DIAGNOSIS — R7989 Other specified abnormal findings of blood chemistry: Secondary | ICD-10-CM | POA: Diagnosis not present

## 2018-12-11 LAB — BASIC METABOLIC PANEL
BUN/Creatinine Ratio: 10 (calc) (ref 6–22)
BUN: 14 mg/dL (ref 7–25)
CALCIUM: 8.7 mg/dL (ref 8.6–10.3)
CO2: 28 mmol/L (ref 20–32)
Chloride: 105 mmol/L (ref 98–110)
Creat: 1.4 mg/dL — ABNORMAL HIGH (ref 0.70–1.11)
GLUCOSE: 101 mg/dL (ref 65–139)
Potassium: 4 mmol/L (ref 3.5–5.3)
SODIUM: 141 mmol/L (ref 135–146)

## 2018-12-13 ENCOUNTER — Telehealth: Payer: Self-pay | Admitting: Cardiovascular Disease

## 2018-12-13 NOTE — Telephone Encounter (Signed)
Patient called stating that he was having issue with a blood test (kidney function) states that he was waiting on a call from office.

## 2018-12-13 NOTE — Telephone Encounter (Signed)
Notes recorded by Laurine Blazer, LPN on 2/89/7915 at 0:41 PM EST Patient notified and verbalized understanding.   ------  Notes recorded by Herminio Commons, MD on 12/12/2018 at 9:35 AM EST I think that is perfectly reasonable. ------  Notes recorded by Laurine Blazer, LPN on 3/64/3837 at 7:93 PM EST Patient notified. Stated that pmd stopped both at [facility] he had with him on 11/20/18. Told him his feet & legs had gone down, ordered stockings & no longer needed medication. Please advise. ------  Notes recorded by Herminio Commons, MD on 12/11/2018 at 12:17 PM EST Renal function stable since last visit. Can try taking Lasix with potassium on Monday, Wednesday, and Friday and repeat basic metabolic panel in 6 weeks.

## 2018-12-14 DIAGNOSIS — N183 Chronic kidney disease, stage 3 (moderate): Secondary | ICD-10-CM | POA: Diagnosis not present

## 2018-12-14 DIAGNOSIS — Z6824 Body mass index (BMI) 24.0-24.9, adult: Secondary | ICD-10-CM | POA: Diagnosis not present

## 2018-12-14 DIAGNOSIS — K56609 Unspecified intestinal obstruction, unspecified as to partial versus complete obstruction: Secondary | ICD-10-CM | POA: Diagnosis not present

## 2018-12-14 DIAGNOSIS — R600 Localized edema: Secondary | ICD-10-CM | POA: Diagnosis not present

## 2018-12-14 DIAGNOSIS — I10 Essential (primary) hypertension: Secondary | ICD-10-CM | POA: Diagnosis not present

## 2018-12-24 ENCOUNTER — Ambulatory Visit (INDEPENDENT_AMBULATORY_CARE_PROVIDER_SITE_OTHER): Payer: PPO | Admitting: *Deleted

## 2018-12-24 DIAGNOSIS — I4891 Unspecified atrial fibrillation: Secondary | ICD-10-CM

## 2018-12-24 DIAGNOSIS — Z5181 Encounter for therapeutic drug level monitoring: Secondary | ICD-10-CM

## 2018-12-24 LAB — POCT INR: INR: 3.6 — AB (ref 2.0–3.0)

## 2018-12-24 NOTE — Patient Instructions (Signed)
Hold coumadin tomorrow then resume 1 tablet daily except 2 tablets on Thursdays Eat greens or salad today Recheck in 3 weeks.

## 2019-01-13 ENCOUNTER — Telehealth: Payer: Self-pay | Admitting: *Deleted

## 2019-01-13 NOTE — Telephone Encounter (Signed)
° °  _____________   YELYH-90 Pre-Screening Questions:   Do you currently have a fever? (yes = cancel and refer to pcp for e-visit)  NO  Have you recently travelled on a cruise, internationally, or to [place], [place], [place], [place], [place], or [place], [place] [employer]) ?  (yes = cancel, stay home, monitor symptoms, and contact pcp or initiate e-visit if symptoms develop)  NO  Have you been in contact with someone that is currently pending confirmation of Covid19 testing or has been confirmed to have the [place] virus?  yes = cancel, stay home, away from tested individual, monitor symptoms, and contact pcp or initiate e-visit if symptoms develop)  NO  Are you currently experiencing fatigue or cough?  (yes = pt should be prepared to have a mask placed at the time of their visit). NO

## 2019-01-14 ENCOUNTER — Ambulatory Visit (INDEPENDENT_AMBULATORY_CARE_PROVIDER_SITE_OTHER): Payer: PPO | Admitting: *Deleted

## 2019-01-14 DIAGNOSIS — Z5181 Encounter for therapeutic drug level monitoring: Secondary | ICD-10-CM

## 2019-01-14 DIAGNOSIS — I4891 Unspecified atrial fibrillation: Secondary | ICD-10-CM

## 2019-01-14 LAB — POCT INR: INR: 2.7 (ref 2.0–3.0)

## 2019-01-14 NOTE — Patient Instructions (Signed)
Continue coumadin 1 tablet daily except 2 tablets on Thursdays Eat greens or salad today Recheck in 4 weeks.

## 2019-02-06 ENCOUNTER — Telehealth: Payer: Self-pay | Admitting: Cardiovascular Disease

## 2019-02-06 ENCOUNTER — Telehealth: Payer: Self-pay | Admitting: *Deleted

## 2019-02-06 NOTE — Telephone Encounter (Signed)
Virtual Visit Pre-Appointment Phone Call  "(Name), I am calling you today to discuss your upcoming appointment. We are currently trying to limit exposure to the virus that causes COVID-19 by seeing patients at home rather than in the office."  1. "What is the BEST phone number to call the day of the visit?" - include this in appointment notes [phone]  2. Do you have or have access to (through a family member/friend) a smartphone with video capability that we can use for your visit?" a. If yes - list this number in appt notes as cell (if different from BEST phone #) and list the appointment type as a VIDEO visit in appointment notes b. If no - list the appointment type as a PHONE visit in appointment notes  3. Confirm consent - "In the setting of the current Covid19 crisis, you are scheduled for a (phone or video) visit with your provider on (date) at (time).  Just as we do with many in-office visits, in order for you to participate in this visit, we must obtain consent.  If you'd like, I can send this to your mychart (if signed up) or email for you to review.  Otherwise, I can obtain your verbal consent now.  All virtual visits are billed to your insurance company just like a normal visit would be.  By agreeing to a virtual visit, we'd like you to understand that the technology does not allow for your provider to perform an examination, and thus may limit your provider's ability to fully assess your condition. If your provider identifies any concerns that need to be evaluated in person, we will make arrangements to do so.  Finally, though the technology is pretty good, we cannot assure that it will always work on either your or our end, and in the setting of a video visit, we may have to convert it to a phone-only visit.  In either situation, we cannot ensure that we have a secure connection.  Are you willing to proceed?" STAFF: Did the patient verbally acknowledge consent to telehealth  visit? Document YES/NO here: YES   4. Advise patient to be prepared - "Two hours prior to your appointment, go ahead and check your blood pressure, pulse, oxygen saturation, and your weight (if you have the equipment to check those) and write them all down. When your visit starts, your provider will ask you for this information. If you have an Apple Watch or Kardia device, please plan to have heart rate information ready on the day of your appointment. Please have a pen and paper handy nearby the day of the visit as well."  5. Give patient instructions for MyChart download to smartphone OR [email or web address] as below if video visit (depending on what platform provider is using)  6. Inform patient they will receive a phone call 15 minutes prior to their appointment time (may be from unknown caller ID) so they should be prepared to answer    TELEPHONE CALL NOTE  [name] has been deemed a candidate for a follow-up tele-health visit to limit community exposure during the Covid-19 pandemic. I spoke with the patient via phone to ensure availability of phone/video source, confirm preferred email & phone number, and discuss instructions and expectations.  I reminded [name] to be prepared with any vital sign and/or heart rhythm information that could potentially be obtained via home monitoring, at the time of his visit. I reminded [name] to expect a phone call  prior to his visit.  Lynnda Child Slaughter 02/06/2019 10:15 AM   INSTRUCTIONS FOR DOWNLOADING THE MYCHART APP TO SMARTPHONE  - The patient must first make sure to have activated MyChart and know their login information - If Apple, go to [employer] and type in MyChart in the search bar and download the app. If Android, ask patient to go to [employer] and type in [place] in the search bar and download the app. The app is free but as with any other app downloads, their phone may require them to verify saved payment information or  Apple/Android password.  - The patient will need to then log into the app with their MyChart username and password, and select [facility] as their healthcare provider to link the account. When it is time for your visit, go to the MyChart app, find appointments, and click Begin Video Visit. Be sure to Select Allow for your device to access the Microphone and Camera for your visit. You will then be connected, and your provider will be with you shortly.  **If they have any issues connecting, or need assistance please contact MyChart service desk [phone])**  **If using a computer, in order to ensure the best quality for their visit they will need to use either of the following Internet Browsers: [employer], or Google Chrome**  IF USING DOXIMITY or DOXY.ME - The patient will receive a link just prior to their visit by text.     FULL LENGTH CONSENT FOR TELE-HEALTH VISIT   I hereby voluntarily request, consent and authorize [facility] and its employed or contracted physicians, physician assistants, nurse practitioners or other licensed health care professionals (the Practitioner), to provide me with telemedicine health care services (the Services") as deemed necessary by the treating Practitioner. I acknowledge and consent to receive the Services by the Practitioner via telemedicine. I understand that the telemedicine visit will involve communicating with the Practitioner through live audiovisual communication technology and the disclosure of certain medical information by electronic transmission. I acknowledge that I have been given the opportunity to request an in-person assessment or other available alternative prior to the telemedicine visit and am voluntarily participating in the telemedicine visit.  I understand that I have the right to withhold or withdraw my consent to the use of telemedicine in the course of my care at any time, without affecting my right to future care  or treatment, and that the Practitioner or I may terminate the telemedicine visit at any time. I understand that I have the right to inspect all information obtained and/or recorded in the course of the telemedicine visit and may receive copies of available information for a reasonable fee.  I understand that some of the potential risks of receiving the Services via telemedicine include:   Delay or interruption in medical evaluation due to technological equipment failure or disruption;  Information transmitted may not be sufficient (e.g. poor resolution of images) to allow for appropriate medical decision making by the Practitioner; and/or   In rare instances, security protocols could fail, causing a breach of personal health information.  Furthermore, I acknowledge that it is my responsibility to provide information about my medical history, conditions and care that is complete and accurate to the best of my ability. I acknowledge that Practitioner's advice, recommendations, and/or decision may be based on factors not within their control, such as incomplete or inaccurate data provided by me or distortions of diagnostic images or specimens that may result from electronic  transmissions. I understand that the practice of medicine is not an exact science and that Practitioner makes no warranties or guarantees regarding treatment outcomes. I acknowledge that I will receive a copy of this consent concurrently upon execution via email to the email address I last provided but may also request a printed copy by calling the office of [facility].    I understand that my insurance will be billed for this visit.   I have read or had this consent read to me.  I understand the contents of this consent, which adequately explains the benefits and risks of the Services being provided via telemedicine.   I have been provided ample opportunity to ask questions regarding this consent and the Services and have had  my questions answered to my satisfaction.  I give my informed consent for the services to be provided through the use of telemedicine in my medical care  By participating in this telemedicine visit I agree to the above.

## 2019-02-06 NOTE — Telephone Encounter (Signed)
Reviewed pt medications/allergies/pharmacy. Pt will have BP/HR weight available. Pt has coumadin appt at 930am (drive up)

## 2019-02-10 ENCOUNTER — Other Ambulatory Visit: Payer: Self-pay

## 2019-02-10 ENCOUNTER — Ambulatory Visit (INDEPENDENT_AMBULATORY_CARE_PROVIDER_SITE_OTHER): Payer: PPO | Admitting: *Deleted

## 2019-02-10 ENCOUNTER — Encounter: Payer: Self-pay | Admitting: Cardiovascular Disease

## 2019-02-10 ENCOUNTER — Telehealth (INDEPENDENT_AMBULATORY_CARE_PROVIDER_SITE_OTHER): Payer: PPO | Admitting: Cardiovascular Disease

## 2019-02-10 VITALS — BP 127/71 | HR 85 | Ht 68.0 in | Wt 161.0 lb

## 2019-02-10 DIAGNOSIS — N183 Chronic kidney disease, stage 3 unspecified: Secondary | ICD-10-CM

## 2019-02-10 DIAGNOSIS — I4891 Unspecified atrial fibrillation: Secondary | ICD-10-CM | POA: Diagnosis not present

## 2019-02-10 DIAGNOSIS — I25118 Atherosclerotic heart disease of native coronary artery with other forms of angina pectoris: Secondary | ICD-10-CM

## 2019-02-10 DIAGNOSIS — Z5181 Encounter for therapeutic drug level monitoring: Secondary | ICD-10-CM

## 2019-02-10 DIAGNOSIS — Z7901 Long term (current) use of anticoagulants: Secondary | ICD-10-CM

## 2019-02-10 DIAGNOSIS — I4821 Permanent atrial fibrillation: Secondary | ICD-10-CM

## 2019-02-10 DIAGNOSIS — I5032 Chronic diastolic (congestive) heart failure: Secondary | ICD-10-CM

## 2019-02-10 DIAGNOSIS — K219 Gastro-esophageal reflux disease without esophagitis: Secondary | ICD-10-CM

## 2019-02-10 LAB — POCT INR: INR: 2.6 (ref 2.0–3.0)

## 2019-02-10 NOTE — Patient Instructions (Signed)
Medication Instructions:  Continue all current medications.  Labwork: none  Testing/Procedures: none  Follow-Up: 4 months   Any Other Special Instructions Will Be Listed Below (If Applicable).  If you need a refill on your cardiac medications before your next appointment, please call your pharmacy.\ 

## 2019-02-10 NOTE — Patient Instructions (Signed)
Continue coumadin 1 tablet daily except 2 tablets on Thursdays Eat greens or salad today Recheck in 4 weeks.

## 2019-02-10 NOTE — Progress Notes (Addendum)
Virtual Visit via Telephone Note   This visit type was conducted due to national recommendations for restrictions regarding the COVID-19 Pandemic (e.g. social distancing) in an effort to limit this patient's exposure and mitigate transmission in our community.  Due to his co-morbid illnesses, this patient is at least at moderate risk for complications without adequate follow up.  This format is felt to be most appropriate for this patient at this time.  The patient did not have access to video technology/had technical difficulties with video requiring transitioning to audio format only (telephone).  All issues noted in this document were discussed and addressed.  No physical exam could be performed with this format.  Please refer to the patient's chart for his  consent to telehealth for [facility].   Evaluation Performed:  Follow-up visit  Date:  02/10/2019   ID:  [name], DOB [birth year], MRN [id number]  Patient Location: Home Provider Location: Office  PCP:  Manon Hilding, MD  Cardiologist:  Kate Sable, MD  Electrophysiologist:  None   Chief Complaint:  CAD and atrial fibrillation  History of Present Illness:    [name] is a 85 y.o. male with coronary artery disease, permanent atrial fibrillation, and GERD.  He initially stopped using Lasix due to variable renal function as instructed by his PCP and was prescribed compression stockings. He now takes it as needed.  I personally reviewed his weight log which shows weight of 161 pounds on 02/10/2019.  It has ranged from 157-163 pounds over the past few months.  He has been doing well overall.  He denies chest pain, shortness of breath, and palpitations.  He takes Lasix as needed and weighs himself daily.  He has been walking on the treadmill for 15 to 20 minutes on a daily basis.  He has also been watching TV with his wife and doing some reading.  The patient does not have symptoms concerning for COVID-19 infection  (fever, chills, cough, or new shortness of breath).    Past Medical History:  Diagnosis Date  . Atrial fibrillation ([place])    Permanent  . CAD (coronary artery disease)    Catheterization 2004, mild/moderate nonobstructive disease  /   nuclear, 2007, small inferior scar // no ischemia  . Carotid bruit    Doppler, December 23, 2010, no significant carotid disease  . Clot    LA, small, in past  . Dizziness 03/2010   June, 2011   Mild, stop digoxin, felt better  . Ejection fraction 02/2009   EF 55-60%,Echo, May, 2010  . Elevated PSA   . GERD (gastroesophageal reflux disease)   . LVH (left ventricular hypertrophy) 02/2009    Mild, echo, 2010  . Mitral regurgitation    Mild, echo, flat closure  . Nausea vomiting and diarrhea 01/2010   Hospitalization, probably viral (or partial small bowel obstruction)/ Hospital 09/2010, recurrent possible partial small bowel obstruction, medical therapy  . Other malaise and fatigue   . Warfarin anticoagulation    Atrial fibrillation   Past Surgical History:  Procedure Laterality Date  . AGILE CAPSULE  10/18/2011   Procedure: AGILE CAPSULE;  Surgeon: Rogene Houston, MD;  [facility];  Service: Endoscopy;  Laterality: N/A;  730  . CARDIAC CATHETERIZATION  2004  . CHOLECYSTECTOMY  2010   Dr. Anthony Sar  . COLONOSCOPY  2008   [facility]  . COLONOSCOPY N/A 03/23/2016   Procedure: COLONOSCOPY;  Surgeon: Rogene Houston, MD;  [facility];  Service: Endoscopy;  Laterality: N/A;  1:00  . ESOPHAGOGASTRODUODENOSCOPY  11/08/2011   Procedure: ESOPHAGOGASTRODUODENOSCOPY (EGD);  Surgeon: Rogene Houston, MD;  [facility];  Service: Endoscopy;  Laterality: N/A;  300  . POLYPECTOMY  03/23/2016   Procedure: POLYPECTOMY;  Surgeon: Rogene Houston, MD;  [facility];  Service: Endoscopy;;  Cecal polyp removed via cold forceps recto-sigmoid polyp removed via cold snare     Current Meds  Medication Sig  . acetaminophen (TYLENOL)  500 MG tablet Take 500 mg by mouth every 6 (six) hours as needed.  . carvedilol (COREG) 6.25 MG tablet TAKE ONE TABLET BY MOUTH TWICE DAILY.  Marland Kitchen diltiazem (CARDIZEM CD) 180 MG 24 hr capsule Take 180 mg by mouth 2 (two) times daily.   . furosemide (LASIX) 20 MG tablet Take 1 tablet (20 mg total) by mouth every other day.  . levothyroxine (SYNTHROID, LEVOTHROID) 75 MCG tablet Take 75 mcg by mouth daily before breakfast.  . magnesium gluconate (MAGONATE) 500 MG tablet Take 500 mg by mouth 2 (two) times daily.   . Melatonin 10 MG TABS Take by mouth.  . nitroGLYCERIN (NITROSTAT) 0.4 MG SL tablet Place 0.4 mg under the tongue every 5 (five) minutes as needed. For chest pains. May repeat for up to 3 doses.  . pantoprazole (PROTONIX) 40 MG tablet TAKE ONE TABLET BY MOUTH DAILY. (Patient taking differently: 2 (two) times daily. )  . potassium chloride SA (K-DUR,KLOR-CON) 20 MEQ tablet Take 1 tablet (20 mEq total) by mouth every other day.  . simvastatin (ZOCOR) 10 MG tablet Take 10 mg by mouth at bedtime.   . Tamsulosin HCl (FLOMAX) 0.4 MG CAPS Take 0.4 mg by mouth every evening.   . warfarin (COUMADIN) 2.5 MG tablet Take 2.5 mg by mouth as directed. As directed by coumadin clinic      Allergies:   Patient has no known allergies.   Social History   Tobacco Use  . Smoking status: Never Smoker  . Smokeless tobacco: Never Used  Substance Use Topics  . Alcohol use: No    Alcohol/week: 0.0 standard drinks  . Drug use: No     Family Hx: The patient's family history includes Colon cancer in his mother; Healthy in his daughter; Heart disease in his father; Stroke in his brother.  ROS:   Please see the history of present illness.     All other systems reviewed and are negative.   Prior CV studies:   The following studies were reviewed today:  Echocardiogram on 10/22/2018 showed normal left ventricular systolic function and regional wall motion, LVEF 55%.  There was mild left atrial dilatation.   He underwent a low risk nuclear stress test in October 2018 which demonstrated prior inferoseptal myocardial infarction without ischemia.  Labs/Other Tests and Data Reviewed:    EKG:  No ECG reviewed.  Recent Labs: 12/10/2018: BUN 14; Creat 1.40; Potassium 4.0; Sodium 141   Recent Lipid Panel No results found for: CHOL, TRIG, HDL, CHOLHDL, LDLCALC, LDLDIRECT  Wt Readings from Last 3 Encounters:  02/10/19 161 lb (73 kg)  11/04/18 166 lb (75.3 kg)  03/18/18 173 lb (78.5 kg)     Objective:    Vital Signs:  BP 127/71   Pulse 85   Ht 5' 8"  (1.727 m)   Wt 161 lb (73 kg)   BMI 24.48 kg/m    VITAL SIGNS:  reviewed  ASSESSMENT & PLAN:    1.Permanentatrial fibrillation: Symptomatically stable  on long-acting diltiazem 180 mg twice daily. Anticoagulated with warfarin. No changes.  2. Coronary artery disease:Symptomaticallystable. Low risk nuclear stress test in October 2018 asnoted above.Continuecarvedilol and simvastatin.No aspirin as he is on warfarin.   3. GERD:Symptomaticallystableon Protonix 40 mg daily.Ipreviouslyinstructed him to take it 30-60 minutes before lunch.  4.  Chronic diastolic heart failure: Now takes Lasix prn and weighs daily.  Uses compression stockings as prescribed by his PCP.  He has been instructed to take Lasix for a 3 pound weight gain in 24 hours or 5 pound weight gain in 1 week.  5.  Chronic kidney disease stage III: Creatinine 1.4 on 12/10/2018.   COVID-19 Education: The signs and symptoms of COVID-19 were discussed with the patient and how to seek care for testing (follow up with PCP or arrange E-visit).  The importance of social distancing was discussed today.  Time:   Today, I have spent 15 minutes with the patient with telehealth technology discussing the above problems.     Medication Adjustments/Labs and Tests Ordered: Current medicines are reviewed at length with the patient today.  Concerns regarding medicines are  outlined above.   Tests Ordered: No orders of the defined types were placed in this encounter.   Medication Changes: No orders of the defined types were placed in this encounter.   Disposition:  Follow up in 4 month(s)  Signed, Kate Sable, MD  02/10/2019 10:29 AM    [facility]

## 2019-03-11 ENCOUNTER — Other Ambulatory Visit: Payer: Self-pay

## 2019-03-11 ENCOUNTER — Ambulatory Visit (INDEPENDENT_AMBULATORY_CARE_PROVIDER_SITE_OTHER): Payer: PPO | Admitting: *Deleted

## 2019-03-11 DIAGNOSIS — Z5181 Encounter for therapeutic drug level monitoring: Secondary | ICD-10-CM | POA: Diagnosis not present

## 2019-03-11 DIAGNOSIS — I4891 Unspecified atrial fibrillation: Secondary | ICD-10-CM

## 2019-03-11 LAB — POCT INR: INR: 3 (ref 2.0–3.0)

## 2019-03-11 NOTE — Patient Instructions (Signed)
Continue coumadin 1 tablet daily except 2 tablets on Thursdays Eat greens or salad today Recheck in 6 weeks.

## 2019-03-14 ENCOUNTER — Other Ambulatory Visit: Payer: Self-pay

## 2019-03-14 DIAGNOSIS — I10 Essential (primary) hypertension: Secondary | ICD-10-CM | POA: Diagnosis not present

## 2019-03-14 DIAGNOSIS — K219 Gastro-esophageal reflux disease without esophagitis: Secondary | ICD-10-CM | POA: Diagnosis not present

## 2019-03-14 DIAGNOSIS — K5730 Diverticulosis of large intestine without perforation or abscess without bleeding: Secondary | ICD-10-CM | POA: Diagnosis not present

## 2019-03-14 DIAGNOSIS — E039 Hypothyroidism, unspecified: Secondary | ICD-10-CM | POA: Diagnosis not present

## 2019-03-14 DIAGNOSIS — Z7901 Long term (current) use of anticoagulants: Secondary | ICD-10-CM | POA: Diagnosis not present

## 2019-03-14 DIAGNOSIS — Z4682 Encounter for fitting and adjustment of non-vascular catheter: Secondary | ICD-10-CM | POA: Diagnosis not present

## 2019-03-14 DIAGNOSIS — I4891 Unspecified atrial fibrillation: Secondary | ICD-10-CM | POA: Diagnosis not present

## 2019-03-14 DIAGNOSIS — Z86010 Personal history of colonic polyps: Secondary | ICD-10-CM | POA: Diagnosis not present

## 2019-03-14 DIAGNOSIS — N400 Enlarged prostate without lower urinary tract symptoms: Secondary | ICD-10-CM | POA: Diagnosis not present

## 2019-03-14 DIAGNOSIS — K598 Other specified functional intestinal disorders: Secondary | ICD-10-CM | POA: Diagnosis not present

## 2019-03-14 DIAGNOSIS — R109 Unspecified abdominal pain: Secondary | ICD-10-CM | POA: Diagnosis not present

## 2019-03-14 DIAGNOSIS — I482 Chronic atrial fibrillation, unspecified: Secondary | ICD-10-CM | POA: Diagnosis not present

## 2019-03-14 DIAGNOSIS — K449 Diaphragmatic hernia without obstruction or gangrene: Secondary | ICD-10-CM | POA: Diagnosis not present

## 2019-03-14 DIAGNOSIS — R140 Abdominal distension (gaseous): Secondary | ICD-10-CM | POA: Diagnosis not present

## 2019-03-14 DIAGNOSIS — N1330 Unspecified hydronephrosis: Secondary | ICD-10-CM | POA: Diagnosis not present

## 2019-03-14 DIAGNOSIS — K56600 Partial intestinal obstruction, unspecified as to cause: Secondary | ICD-10-CM | POA: Diagnosis not present

## 2019-03-14 DIAGNOSIS — R1084 Generalized abdominal pain: Secondary | ICD-10-CM | POA: Diagnosis not present

## 2019-03-14 DIAGNOSIS — Z9049 Acquired absence of other specified parts of digestive tract: Secondary | ICD-10-CM | POA: Diagnosis not present

## 2019-03-14 DIAGNOSIS — K5651 Intestinal adhesions [bands], with partial obstruction: Secondary | ICD-10-CM | POA: Diagnosis not present

## 2019-03-14 DIAGNOSIS — R0989 Other specified symptoms and signs involving the circulatory and respiratory systems: Secondary | ICD-10-CM | POA: Diagnosis not present

## 2019-03-14 NOTE — Patient Outreach (Signed)
[facility]) Care Management  03/14/2019  ROI JAFARI 1931-06-27 [id number]   Referral Date: 03/14/2019 Referral Source: Nurseline Referral Reason: Irregular Heart rate, diarrhea, nausea, and shaky   Outreach Attempt: Telephone call to patient for follow up nurseline call.  Wife answers and states that patient is in the hospital and will be admitted to [facility]. CM verbalized understanding.     Plan: RN CM will close case.       [name], RN, MSN [facility] Care Management Care Management Coordinator Direct Line [phone] Toll Free: [phone]  Fax: [phone]

## 2019-04-07 DIAGNOSIS — N183 Chronic kidney disease, stage 3 (moderate): Secondary | ICD-10-CM | POA: Diagnosis not present

## 2019-04-07 DIAGNOSIS — K210 Gastro-esophageal reflux disease with esophagitis: Secondary | ICD-10-CM | POA: Diagnosis not present

## 2019-04-07 DIAGNOSIS — E039 Hypothyroidism, unspecified: Secondary | ICD-10-CM | POA: Diagnosis not present

## 2019-04-07 DIAGNOSIS — I10 Essential (primary) hypertension: Secondary | ICD-10-CM | POA: Diagnosis not present

## 2019-04-08 DIAGNOSIS — I10 Essential (primary) hypertension: Secondary | ICD-10-CM | POA: Diagnosis not present

## 2019-04-08 DIAGNOSIS — E039 Hypothyroidism, unspecified: Secondary | ICD-10-CM | POA: Diagnosis not present

## 2019-04-08 DIAGNOSIS — K210 Gastro-esophageal reflux disease with esophagitis: Secondary | ICD-10-CM | POA: Diagnosis not present

## 2019-04-08 DIAGNOSIS — N183 Chronic kidney disease, stage 3 (moderate): Secondary | ICD-10-CM | POA: Diagnosis not present

## 2019-04-11 DIAGNOSIS — N183 Chronic kidney disease, stage 3 (moderate): Secondary | ICD-10-CM | POA: Diagnosis not present

## 2019-04-11 DIAGNOSIS — I4891 Unspecified atrial fibrillation: Secondary | ICD-10-CM | POA: Diagnosis not present

## 2019-04-11 DIAGNOSIS — G2581 Restless legs syndrome: Secondary | ICD-10-CM | POA: Diagnosis not present

## 2019-04-11 DIAGNOSIS — Z0001 Encounter for general adult medical examination with abnormal findings: Secondary | ICD-10-CM | POA: Diagnosis not present

## 2019-04-11 DIAGNOSIS — I10 Essential (primary) hypertension: Secondary | ICD-10-CM | POA: Diagnosis not present

## 2019-04-11 DIAGNOSIS — K56600 Partial intestinal obstruction, unspecified as to cause: Secondary | ICD-10-CM | POA: Diagnosis not present

## 2019-04-11 DIAGNOSIS — N1330 Unspecified hydronephrosis: Secondary | ICD-10-CM | POA: Diagnosis not present

## 2019-04-15 DIAGNOSIS — N183 Chronic kidney disease, stage 3 (moderate): Secondary | ICD-10-CM | POA: Diagnosis not present

## 2019-04-15 DIAGNOSIS — I10 Essential (primary) hypertension: Secondary | ICD-10-CM | POA: Diagnosis not present

## 2019-04-16 DIAGNOSIS — I129 Hypertensive chronic kidney disease with stage 1 through stage 4 chronic kidney disease, or unspecified chronic kidney disease: Secondary | ICD-10-CM | POA: Diagnosis not present

## 2019-04-16 DIAGNOSIS — N400 Enlarged prostate without lower urinary tract symptoms: Secondary | ICD-10-CM | POA: Diagnosis not present

## 2019-04-16 DIAGNOSIS — N323 Diverticulum of bladder: Secondary | ICD-10-CM | POA: Diagnosis not present

## 2019-04-16 DIAGNOSIS — N1330 Unspecified hydronephrosis: Secondary | ICD-10-CM | POA: Diagnosis not present

## 2019-04-16 DIAGNOSIS — N183 Chronic kidney disease, stage 3 (moderate): Secondary | ICD-10-CM | POA: Diagnosis not present

## 2019-04-16 DIAGNOSIS — N281 Cyst of kidney, acquired: Secondary | ICD-10-CM | POA: Diagnosis not present

## 2019-04-22 ENCOUNTER — Ambulatory Visit (INDEPENDENT_AMBULATORY_CARE_PROVIDER_SITE_OTHER): Payer: PPO | Admitting: *Deleted

## 2019-04-22 DIAGNOSIS — I4891 Unspecified atrial fibrillation: Secondary | ICD-10-CM

## 2019-04-22 DIAGNOSIS — Z5181 Encounter for therapeutic drug level monitoring: Secondary | ICD-10-CM | POA: Diagnosis not present

## 2019-04-22 LAB — POCT INR: INR: 3.5 — AB (ref 2.0–3.0)

## 2019-04-22 NOTE — Patient Instructions (Signed)
Hold coumadin tomorrow (took this am) then decrease dose to 1 tablet daily  Eat greens or salad today Recheck in 6 weeks at Dr Raliegh Ip appt.

## 2019-04-29 ENCOUNTER — Ambulatory Visit (INDEPENDENT_AMBULATORY_CARE_PROVIDER_SITE_OTHER): Payer: PPO | Admitting: Urology

## 2019-04-29 DIAGNOSIS — R351 Nocturia: Secondary | ICD-10-CM

## 2019-04-29 DIAGNOSIS — N401 Enlarged prostate with lower urinary tract symptoms: Secondary | ICD-10-CM

## 2019-04-29 DIAGNOSIS — R130 Aphagia: Secondary | ICD-10-CM

## 2019-04-30 DIAGNOSIS — K56609 Unspecified intestinal obstruction, unspecified as to partial versus complete obstruction: Secondary | ICD-10-CM | POA: Diagnosis not present

## 2019-05-05 ENCOUNTER — Telehealth: Payer: Self-pay | Admitting: Cardiovascular Disease

## 2019-05-05 NOTE — Telephone Encounter (Signed)
LM with [facility] that we do not prescribe Flomax for this pt

## 2019-05-05 NOTE — Telephone Encounter (Signed)
Needing prior auth for Tamsulosin HCl (FLOMAX) 0.4 MG CAPS [[id number]]  Sent in per pharmacists at [facility]), pt uses [facility] in [place].    [phone] tele # for prior auth

## 2019-05-16 DIAGNOSIS — I10 Essential (primary) hypertension: Secondary | ICD-10-CM | POA: Diagnosis not present

## 2019-05-16 DIAGNOSIS — E039 Hypothyroidism, unspecified: Secondary | ICD-10-CM | POA: Diagnosis not present

## 2019-05-16 DIAGNOSIS — E7800 Pure hypercholesterolemia, unspecified: Secondary | ICD-10-CM | POA: Diagnosis not present

## 2019-05-17 ENCOUNTER — Other Ambulatory Visit: Payer: Self-pay | Admitting: Cardiovascular Disease

## 2019-05-31 DIAGNOSIS — K210 Gastro-esophageal reflux disease with esophagitis: Secondary | ICD-10-CM | POA: Diagnosis not present

## 2019-05-31 DIAGNOSIS — R109 Unspecified abdominal pain: Secondary | ICD-10-CM | POA: Diagnosis not present

## 2019-05-31 DIAGNOSIS — R110 Nausea: Secondary | ICD-10-CM | POA: Diagnosis not present

## 2019-05-31 DIAGNOSIS — Z6823 Body mass index (BMI) 23.0-23.9, adult: Secondary | ICD-10-CM | POA: Diagnosis not present

## 2019-06-09 DIAGNOSIS — L570 Actinic keratosis: Secondary | ICD-10-CM | POA: Diagnosis not present

## 2019-06-12 ENCOUNTER — Encounter: Payer: Self-pay | Admitting: Cardiovascular Disease

## 2019-06-12 ENCOUNTER — Other Ambulatory Visit: Payer: Self-pay

## 2019-06-12 ENCOUNTER — Ambulatory Visit (INDEPENDENT_AMBULATORY_CARE_PROVIDER_SITE_OTHER): Payer: PPO | Admitting: Cardiovascular Disease

## 2019-06-12 ENCOUNTER — Ambulatory Visit (INDEPENDENT_AMBULATORY_CARE_PROVIDER_SITE_OTHER): Payer: PPO | Admitting: *Deleted

## 2019-06-12 VITALS — BP 140/82 | HR 77 | Temp 98.8°F | Ht 68.0 in | Wt 156.0 lb

## 2019-06-12 DIAGNOSIS — N183 Chronic kidney disease, stage 3 unspecified: Secondary | ICD-10-CM

## 2019-06-12 DIAGNOSIS — I10 Essential (primary) hypertension: Secondary | ICD-10-CM

## 2019-06-12 DIAGNOSIS — Z5181 Encounter for therapeutic drug level monitoring: Secondary | ICD-10-CM

## 2019-06-12 DIAGNOSIS — Z7901 Long term (current) use of anticoagulants: Secondary | ICD-10-CM | POA: Diagnosis not present

## 2019-06-12 DIAGNOSIS — I5032 Chronic diastolic (congestive) heart failure: Secondary | ICD-10-CM | POA: Diagnosis not present

## 2019-06-12 DIAGNOSIS — I25118 Atherosclerotic heart disease of native coronary artery with other forms of angina pectoris: Secondary | ICD-10-CM | POA: Diagnosis not present

## 2019-06-12 DIAGNOSIS — I4891 Unspecified atrial fibrillation: Secondary | ICD-10-CM

## 2019-06-12 DIAGNOSIS — I4821 Permanent atrial fibrillation: Secondary | ICD-10-CM

## 2019-06-12 LAB — POCT INR: INR: 2.4 (ref 2.0–3.0)

## 2019-06-12 NOTE — Patient Instructions (Signed)
Continue coumadin 1 tablet daily  Eat greens or salad today Recheck in 6 weeks

## 2019-06-12 NOTE — Patient Instructions (Signed)

## 2019-06-12 NOTE — Progress Notes (Signed)
SUBJECTIVE: [name] is a 85 y.o. male with coronary artery disease, permanent atrial fibrillation, and GERD.  wall motion, LVEF 55%. There was mild left atrial dilatation.  He underwent a low risk nuclear stress test in October 2018whichdemonstrated prior inferoseptal myocardial infarction without ischemia.  He is doing well and denies chest pain, palpitations, leg swelling, and shortness of breath.  Primary complaints relate to small bowel obstruction.  He has been encouraged to drink a lot of fluid and he tries to drink about 70 ounces of water daily.  He walks about 20 minutes on the treadmill daily.  He has had some mild memory issues.  He to me that his wife has a fear of falling and does not do well with walking outside of the home.    Review of Systems: As per "subjective", otherwise negative.  No Known Allergies  Current Outpatient Medications  Medication Sig Dispense Refill  . acetaminophen (TYLENOL) 500 MG tablet Take 500 mg by mouth every 6 (six) hours as needed.    . carvedilol (COREG) 6.25 MG tablet TAKE ONE TABLET BY MOUTH TWICE DAILY. 60 tablet 6  . diltiazem (CARDIZEM CD) 180 MG 24 hr capsule Take 180 mg by mouth 2 (two) times daily.     . finasteride (PROSCAR) 5 MG tablet Take 5 mg by mouth daily.    Marland Kitchen levothyroxine (SYNTHROID, LEVOTHROID) 75 MCG tablet Take 75 mcg by mouth daily before breakfast.    . magnesium gluconate (MAGONATE) 500 MG tablet Take 500 mg by mouth 2 (two) times daily.     . Melatonin 10 MG TABS Take by mouth.    . nitroGLYCERIN (NITROSTAT) 0.4 MG SL tablet Place 0.4 mg under the tongue every 5 (five) minutes as needed. For chest pains. May repeat for up to 3 doses.    . simvastatin (ZOCOR) 10 MG tablet Take 10 mg by mouth at bedtime.     . Tamsulosin HCl (FLOMAX) 0.4 MG CAPS Take 0.4 mg by mouth 2 (two) times daily.     Marland Kitchen warfarin (COUMADIN) 2.5 MG tablet Take 2.5 mg by mouth as directed. As directed by coumadin clinic      No  current facility-administered medications for this visit.     Past Medical History:  Diagnosis Date  . Atrial fibrillation ([place])    Permanent  . CAD (coronary artery disease)    Catheterization 2004, mild/moderate nonobstructive disease  /   nuclear, 2007, small inferior scar // no ischemia  . Carotid bruit    Doppler, December 23, 2010, no significant carotid disease  . Clot    LA, small, in past  . Dizziness 03/2010   June, 2011   Mild, stop digoxin, felt better  . Ejection fraction 02/2009   EF 55-60%,Echo, May, 2010  . Elevated PSA   . GERD (gastroesophageal reflux disease)   . LVH (left ventricular hypertrophy) 02/2009    Mild, echo, 2010  . Mitral regurgitation    Mild, echo, flat closure  . Nausea vomiting and diarrhea 01/2010   Hospitalization, probably viral (or partial small bowel obstruction)/ Hospital 09/2010, recurrent possible partial small bowel obstruction, medical therapy  . Other malaise and fatigue   . Warfarin anticoagulation    Atrial fibrillation    Past Surgical History:  Procedure Laterality Date  . AGILE CAPSULE  10/18/2011   Procedure: AGILE CAPSULE;  Surgeon: Rogene Houston, MD;  [facility];  Service: Endoscopy;  Laterality: N/A;  [place]  2004  . CHOLECYSTECTOMY  2010   Dr. Anthony Sar  . COLONOSCOPY  2008   [facility]  . COLONOSCOPY N/A 03/23/2016   Procedure: COLONOSCOPY;  Surgeon: Rogene Houston, MD;  [facility];  Service: Endoscopy;  Laterality: N/A;  1:00  . ESOPHAGOGASTRODUODENOSCOPY  11/08/2011   Procedure: ESOPHAGOGASTRODUODENOSCOPY (EGD);  Surgeon: Rogene Houston, MD;  [facility];  Service: Endoscopy;  Laterality: N/A;  300  . POLYPECTOMY  03/23/2016   Procedure: POLYPECTOMY;  Surgeon: Rogene Houston, MD;  [facility];  Service: Endoscopy;;  Cecal polyp removed via cold forceps recto-sigmoid polyp removed via cold snare    Social History   Socioeconomic History  .  Marital status: Married    Spouse name: Not on file  . Number of children: Not on file  . Years of education: Not on file  . Highest education level: Not on file  Occupational History  . Occupation: Engineer, agricultural: RETIRED  Social Needs  . Financial resource strain: Not on file  . Food insecurity    Worry: Not on file    Inability: Not on file  . Transportation needs    Medical: Not on file    Non-medical: Not on file  Tobacco Use  . Smoking status: Never Smoker  . Smokeless tobacco: Never Used  Substance and Sexual Activity  . Alcohol use: No    Alcohol/week: 0.0 standard drinks  . Drug use: No  . Sexual activity: Not on file  Lifestyle  . Physical activity    Days per week: Not on file    Minutes per session: Not on file  . Stress: Not on file  Relationships  . Social Herbalist on phone: Not on file    Gets together: Not on file    Attends religious service: Not on file    Active member of club or organization: Not on file    Attends meetings of clubs or organizations: Not on file    Relationship status: Not on file  . Intimate partner violence    Fear of current or ex partner: Not on file    Emotionally abused: Not on file    Physically abused: Not on file    Forced sexual activity: Not on file  Other Topics Concern  . Not on file  Social History Narrative   Married     Vitals:   06/12/19 0817  BP: 140/82  Pulse: 77  Temp: 98.8 F (37.1 C)  SpO2: 98%  Weight: 156 lb (70.8 kg)  Height: 5' 8"  (1.727 m)    Wt Readings from Last 3 Encounters:  06/12/19 156 lb (70.8 kg)  02/10/19 161 lb (73 kg)  11/04/18 166 lb (75.3 kg)     PHYSICAL EXAM General: NAD HEENT: Normal. Neck: No JVD, no thyromegaly. Lungs: Clear to auscultation bilaterally with normal respiratory effort. CV: Regular rate and irregular rhythm, normal S1/S2, no S3, no murmur. No pretibial or periankle edema.  No carotid bruit.   Abdomen: Soft,  nontender, no distention.  Neurologic: Alert and oriented.  Psych: Normal affect. Skin: Normal. Musculoskeletal: No gross deformities.    ECG: Rate controlled atrial fibrillation, old anteroseptal infarct.   Labs: Lab Results  Component Value Date/Time   K 4.0 12/10/2018 11:23 AM   BUN 14 12/10/2018 11:23 AM   CREATININE 1.40 (H) 12/10/2018 11:23 AM   ALT 10 06/15/2017 09:29  AM   HGB 12.8 (L) 06/15/2017 09:31 AM     Lipids: No results found for: LDLCALC, LDLDIRECT, CHOL, TRIG, HDL     ASSESSMENT AND PLAN:  1.Permanentatrial fibrillation: Symptomatically stable on long-acting diltiazem 180 mg twice daily. Anticoagulated with warfarin.  INR 2.4 today.  No changes.  2. Coronary artery disease:Symptomaticallystable. Low risk nuclear stress test in October 2018 asnoted above.Continuecarvedilol and simvastatin.No aspirin as he is on warfarin.   3. Chronic diastolic heart failure: He takes Lasix prn and weighs daily.  Uses compression stockings as prescribed by his PCP.  He has been instructed to take Lasix for a 3 pound weight gain in 24 hours or 5 pound weight gain in 1 week.  4.  Chronic kidney disease stage III: Creatinine 1.4 on 12/10/2018.  5.  Hypertension: Borderline elevation.  No change to therapy.   Disposition: Follow up 6 months   Kate Sable, M.D., F.A.C.C.

## 2019-06-16 DIAGNOSIS — I10 Essential (primary) hypertension: Secondary | ICD-10-CM | POA: Diagnosis not present

## 2019-06-16 DIAGNOSIS — E039 Hypothyroidism, unspecified: Secondary | ICD-10-CM | POA: Diagnosis not present

## 2019-06-16 DIAGNOSIS — E7800 Pure hypercholesterolemia, unspecified: Secondary | ICD-10-CM | POA: Diagnosis not present

## 2019-06-18 DIAGNOSIS — Z8719 Personal history of other diseases of the digestive system: Secondary | ICD-10-CM | POA: Diagnosis not present

## 2019-06-18 DIAGNOSIS — Z6823 Body mass index (BMI) 23.0-23.9, adult: Secondary | ICD-10-CM | POA: Diagnosis not present

## 2019-06-18 DIAGNOSIS — R197 Diarrhea, unspecified: Secondary | ICD-10-CM | POA: Diagnosis not present

## 2019-06-27 DIAGNOSIS — Z23 Encounter for immunization: Secondary | ICD-10-CM | POA: Diagnosis not present

## 2019-06-27 DIAGNOSIS — A084 Viral intestinal infection, unspecified: Secondary | ICD-10-CM | POA: Diagnosis not present

## 2019-06-27 DIAGNOSIS — R2689 Other abnormalities of gait and mobility: Secondary | ICD-10-CM | POA: Diagnosis not present

## 2019-06-27 DIAGNOSIS — Z6823 Body mass index (BMI) 23.0-23.9, adult: Secondary | ICD-10-CM | POA: Diagnosis not present

## 2019-06-27 DIAGNOSIS — H6092 Unspecified otitis externa, left ear: Secondary | ICD-10-CM | POA: Diagnosis not present

## 2019-06-27 DIAGNOSIS — R51 Headache: Secondary | ICD-10-CM | POA: Diagnosis not present

## 2019-07-08 ENCOUNTER — Ambulatory Visit (INDEPENDENT_AMBULATORY_CARE_PROVIDER_SITE_OTHER): Payer: PPO | Admitting: Urology

## 2019-07-08 DIAGNOSIS — N281 Cyst of kidney, acquired: Secondary | ICD-10-CM | POA: Diagnosis not present

## 2019-07-08 DIAGNOSIS — R351 Nocturia: Secondary | ICD-10-CM

## 2019-07-08 DIAGNOSIS — N1330 Unspecified hydronephrosis: Secondary | ICD-10-CM | POA: Diagnosis not present

## 2019-07-08 DIAGNOSIS — N401 Enlarged prostate with lower urinary tract symptoms: Secondary | ICD-10-CM | POA: Diagnosis not present

## 2019-07-19 DIAGNOSIS — I480 Paroxysmal atrial fibrillation: Secondary | ICD-10-CM | POA: Diagnosis not present

## 2019-07-19 DIAGNOSIS — N400 Enlarged prostate without lower urinary tract symptoms: Secondary | ICD-10-CM | POA: Diagnosis not present

## 2019-07-19 DIAGNOSIS — K56609 Unspecified intestinal obstruction, unspecified as to partial versus complete obstruction: Secondary | ICD-10-CM | POA: Diagnosis not present

## 2019-07-19 DIAGNOSIS — K56600 Partial intestinal obstruction, unspecified as to cause: Secondary | ICD-10-CM | POA: Diagnosis not present

## 2019-07-19 DIAGNOSIS — E039 Hypothyroidism, unspecified: Secondary | ICD-10-CM | POA: Diagnosis not present

## 2019-07-19 DIAGNOSIS — R1033 Periumbilical pain: Secondary | ICD-10-CM | POA: Diagnosis not present

## 2019-07-19 DIAGNOSIS — N182 Chronic kidney disease, stage 2 (mild): Secondary | ICD-10-CM | POA: Diagnosis not present

## 2019-07-19 DIAGNOSIS — Z87442 Personal history of urinary calculi: Secondary | ICD-10-CM | POA: Diagnosis not present

## 2019-07-19 DIAGNOSIS — Z86010 Personal history of colonic polyps: Secondary | ICD-10-CM | POA: Diagnosis not present

## 2019-07-19 DIAGNOSIS — K565 Intestinal adhesions [bands], unspecified as to partial versus complete obstruction: Secondary | ICD-10-CM | POA: Diagnosis not present

## 2019-07-19 DIAGNOSIS — R109 Unspecified abdominal pain: Secondary | ICD-10-CM | POA: Diagnosis not present

## 2019-07-19 DIAGNOSIS — E785 Hyperlipidemia, unspecified: Secondary | ICD-10-CM | POA: Diagnosis not present

## 2019-07-19 DIAGNOSIS — Z9049 Acquired absence of other specified parts of digestive tract: Secondary | ICD-10-CM | POA: Diagnosis not present

## 2019-07-19 DIAGNOSIS — K219 Gastro-esophageal reflux disease without esophagitis: Secondary | ICD-10-CM | POA: Diagnosis not present

## 2019-07-19 DIAGNOSIS — Z7901 Long term (current) use of anticoagulants: Secondary | ICD-10-CM | POA: Diagnosis not present

## 2019-07-19 DIAGNOSIS — K5651 Intestinal adhesions [bands], with partial obstruction: Secondary | ICD-10-CM | POA: Diagnosis not present

## 2019-07-24 ENCOUNTER — Ambulatory Visit (INDEPENDENT_AMBULATORY_CARE_PROVIDER_SITE_OTHER): Payer: PPO | Admitting: *Deleted

## 2019-07-24 ENCOUNTER — Other Ambulatory Visit: Payer: Self-pay

## 2019-07-24 DIAGNOSIS — I4891 Unspecified atrial fibrillation: Secondary | ICD-10-CM | POA: Diagnosis not present

## 2019-07-24 DIAGNOSIS — Z5181 Encounter for therapeutic drug level monitoring: Secondary | ICD-10-CM | POA: Diagnosis not present

## 2019-07-24 LAB — POCT INR: INR: 1.2 — AB (ref 2.0–3.0)

## 2019-07-24 NOTE — Patient Instructions (Signed)
Take warfarin 1 extra tablet today, take 2 tablets tomorrow then resume 1 tablet daily  Eat greens or salad today Recheck in 1 week

## 2019-07-28 ENCOUNTER — Telehealth: Payer: Self-pay | Admitting: Pharmacist

## 2019-07-28 NOTE — Telephone Encounter (Signed)
Yes, I would recommend switching to Eliquis.

## 2019-07-28 NOTE — Telephone Encounter (Signed)
Called pt to discuss changing from warfarin to [place] due to better efficacy and safety data, as well as less frequent monitoring, especially given COVID-19 pandemic.   Pt may be interested in changing, his copay for Eliquis would be $80/3 month supply with mail order and could use free 30 day coupon. He will discuss with Lattie Haw when he comes in for his INR check this week. He would also like confirmation from Dr Bronson Ing that changing to Eliquis would be ok.

## 2019-07-31 ENCOUNTER — Other Ambulatory Visit: Payer: Self-pay

## 2019-07-31 ENCOUNTER — Ambulatory Visit (INDEPENDENT_AMBULATORY_CARE_PROVIDER_SITE_OTHER): Payer: PPO | Admitting: *Deleted

## 2019-07-31 DIAGNOSIS — I4891 Unspecified atrial fibrillation: Secondary | ICD-10-CM | POA: Diagnosis not present

## 2019-07-31 DIAGNOSIS — Z5181 Encounter for therapeutic drug level monitoring: Secondary | ICD-10-CM | POA: Diagnosis not present

## 2019-07-31 LAB — POCT INR: INR: 2.2 (ref 2.0–3.0)

## 2019-07-31 NOTE — Patient Instructions (Signed)
Continue warfarin 1 tablet daily  Continue same amt of greens Recheck in 2 weeks

## 2019-08-09 DIAGNOSIS — Z452 Encounter for adjustment and management of vascular access device: Secondary | ICD-10-CM | POA: Diagnosis not present

## 2019-08-09 DIAGNOSIS — R339 Retention of urine, unspecified: Secondary | ICD-10-CM | POA: Diagnosis not present

## 2019-08-09 DIAGNOSIS — Q430 Meckel's diverticulum (displaced) (hypertrophic): Secondary | ICD-10-CM | POA: Diagnosis not present

## 2019-08-09 DIAGNOSIS — K529 Noninfective gastroenteritis and colitis, unspecified: Secondary | ICD-10-CM | POA: Diagnosis not present

## 2019-08-09 DIAGNOSIS — E039 Hypothyroidism, unspecified: Secondary | ICD-10-CM | POA: Diagnosis not present

## 2019-08-09 DIAGNOSIS — I482 Chronic atrial fibrillation, unspecified: Secondary | ICD-10-CM | POA: Diagnosis not present

## 2019-08-09 DIAGNOSIS — R112 Nausea with vomiting, unspecified: Secondary | ICD-10-CM | POA: Diagnosis not present

## 2019-08-09 DIAGNOSIS — E869 Volume depletion, unspecified: Secondary | ICD-10-CM | POA: Diagnosis not present

## 2019-08-09 DIAGNOSIS — N400 Enlarged prostate without lower urinary tract symptoms: Secondary | ICD-10-CM | POA: Diagnosis not present

## 2019-08-09 DIAGNOSIS — J029 Acute pharyngitis, unspecified: Secondary | ICD-10-CM | POA: Diagnosis not present

## 2019-08-09 DIAGNOSIS — Z5331 Laparoscopic surgical procedure converted to open procedure: Secondary | ICD-10-CM | POA: Diagnosis not present

## 2019-08-09 DIAGNOSIS — K56600 Partial intestinal obstruction, unspecified as to cause: Secondary | ICD-10-CM | POA: Diagnosis not present

## 2019-08-09 DIAGNOSIS — Z7901 Long term (current) use of anticoagulants: Secondary | ICD-10-CM | POA: Diagnosis not present

## 2019-08-09 DIAGNOSIS — K56609 Unspecified intestinal obstruction, unspecified as to partial versus complete obstruction: Secondary | ICD-10-CM | POA: Diagnosis not present

## 2019-08-09 DIAGNOSIS — Z9049 Acquired absence of other specified parts of digestive tract: Secondary | ICD-10-CM | POA: Diagnosis not present

## 2019-08-09 DIAGNOSIS — K219 Gastro-esophageal reflux disease without esophagitis: Secondary | ICD-10-CM | POA: Diagnosis not present

## 2019-08-09 DIAGNOSIS — A084 Viral intestinal infection, unspecified: Secondary | ICD-10-CM | POA: Diagnosis not present

## 2019-08-09 DIAGNOSIS — I4891 Unspecified atrial fibrillation: Secondary | ICD-10-CM | POA: Diagnosis not present

## 2019-08-09 DIAGNOSIS — J9811 Atelectasis: Secondary | ICD-10-CM | POA: Diagnosis not present

## 2019-08-09 DIAGNOSIS — R197 Diarrhea, unspecified: Secondary | ICD-10-CM | POA: Diagnosis not present

## 2019-08-09 DIAGNOSIS — I4821 Permanent atrial fibrillation: Secondary | ICD-10-CM | POA: Diagnosis not present

## 2019-08-09 DIAGNOSIS — R1030 Lower abdominal pain, unspecified: Secondary | ICD-10-CM | POA: Diagnosis not present

## 2019-08-09 DIAGNOSIS — I880 Nonspecific mesenteric lymphadenitis: Secondary | ICD-10-CM | POA: Diagnosis not present

## 2019-08-09 DIAGNOSIS — R590 Localized enlarged lymph nodes: Secondary | ICD-10-CM | POA: Diagnosis not present

## 2019-08-09 DIAGNOSIS — K5669 Other partial intestinal obstruction: Secondary | ICD-10-CM | POA: Diagnosis not present

## 2019-08-09 DIAGNOSIS — E878 Other disorders of electrolyte and fluid balance, not elsewhere classified: Secondary | ICD-10-CM | POA: Diagnosis not present

## 2019-08-10 DIAGNOSIS — K56609 Unspecified intestinal obstruction, unspecified as to partial versus complete obstruction: Secondary | ICD-10-CM | POA: Diagnosis not present

## 2019-08-10 DIAGNOSIS — I4821 Permanent atrial fibrillation: Secondary | ICD-10-CM | POA: Diagnosis not present

## 2019-08-10 DIAGNOSIS — K56600 Partial intestinal obstruction, unspecified as to cause: Secondary | ICD-10-CM | POA: Diagnosis not present

## 2019-08-11 DIAGNOSIS — K56600 Partial intestinal obstruction, unspecified as to cause: Secondary | ICD-10-CM | POA: Diagnosis not present

## 2019-08-11 DIAGNOSIS — I880 Nonspecific mesenteric lymphadenitis: Secondary | ICD-10-CM | POA: Diagnosis not present

## 2019-08-11 DIAGNOSIS — Z5331 Laparoscopic surgical procedure converted to open procedure: Secondary | ICD-10-CM | POA: Diagnosis not present

## 2019-08-11 DIAGNOSIS — Q430 Meckel's diverticulum (displaced) (hypertrophic): Secondary | ICD-10-CM | POA: Diagnosis not present

## 2019-08-11 DIAGNOSIS — R590 Localized enlarged lymph nodes: Secondary | ICD-10-CM | POA: Diagnosis not present

## 2019-08-22 DIAGNOSIS — E039 Hypothyroidism, unspecified: Secondary | ICD-10-CM | POA: Diagnosis not present

## 2019-08-22 DIAGNOSIS — K56600 Partial intestinal obstruction, unspecified as to cause: Secondary | ICD-10-CM | POA: Diagnosis not present

## 2019-08-22 DIAGNOSIS — R42 Dizziness and giddiness: Secondary | ICD-10-CM | POA: Diagnosis not present

## 2019-08-22 DIAGNOSIS — I482 Chronic atrial fibrillation, unspecified: Secondary | ICD-10-CM | POA: Diagnosis not present

## 2019-08-22 DIAGNOSIS — I10 Essential (primary) hypertension: Secondary | ICD-10-CM | POA: Diagnosis not present

## 2019-08-22 DIAGNOSIS — N183 Chronic kidney disease, stage 3 unspecified: Secondary | ICD-10-CM | POA: Diagnosis not present

## 2019-08-22 DIAGNOSIS — I2510 Atherosclerotic heart disease of native coronary artery without angina pectoris: Secondary | ICD-10-CM | POA: Diagnosis not present

## 2019-08-23 DIAGNOSIS — R079 Chest pain, unspecified: Secondary | ICD-10-CM | POA: Diagnosis not present

## 2019-08-23 DIAGNOSIS — K56600 Partial intestinal obstruction, unspecified as to cause: Secondary | ICD-10-CM | POA: Diagnosis not present

## 2019-08-23 DIAGNOSIS — R1013 Epigastric pain: Secondary | ICD-10-CM | POA: Diagnosis not present

## 2019-08-23 DIAGNOSIS — Z7901 Long term (current) use of anticoagulants: Secondary | ICD-10-CM | POA: Diagnosis not present

## 2019-08-23 DIAGNOSIS — R338 Other retention of urine: Secondary | ICD-10-CM | POA: Diagnosis not present

## 2019-08-23 DIAGNOSIS — K50012 Crohn's disease of small intestine with intestinal obstruction: Secondary | ICD-10-CM | POA: Diagnosis not present

## 2019-08-23 DIAGNOSIS — Z9049 Acquired absence of other specified parts of digestive tract: Secondary | ICD-10-CM | POA: Diagnosis not present

## 2019-08-23 DIAGNOSIS — B952 Enterococcus as the cause of diseases classified elsewhere: Secondary | ICD-10-CM | POA: Diagnosis not present

## 2019-08-23 DIAGNOSIS — R41841 Cognitive communication deficit: Secondary | ICD-10-CM | POA: Diagnosis not present

## 2019-08-23 DIAGNOSIS — R2689 Other abnormalities of gait and mobility: Secondary | ICD-10-CM | POA: Diagnosis not present

## 2019-08-23 DIAGNOSIS — R0789 Other chest pain: Secondary | ICD-10-CM | POA: Diagnosis not present

## 2019-08-23 DIAGNOSIS — K56699 Other intestinal obstruction unspecified as to partial versus complete obstruction: Secondary | ICD-10-CM | POA: Diagnosis not present

## 2019-08-23 DIAGNOSIS — K50912 Crohn's disease, unspecified, with intestinal obstruction: Secondary | ICD-10-CM | POA: Diagnosis not present

## 2019-08-23 DIAGNOSIS — E039 Hypothyroidism, unspecified: Secondary | ICD-10-CM | POA: Diagnosis not present

## 2019-08-23 DIAGNOSIS — Z20828 Contact with and (suspected) exposure to other viral communicable diseases: Secondary | ICD-10-CM | POA: Diagnosis not present

## 2019-08-23 DIAGNOSIS — M6281 Muscle weakness (generalized): Secondary | ICD-10-CM | POA: Diagnosis not present

## 2019-08-23 DIAGNOSIS — K5090 Crohn's disease, unspecified, without complications: Secondary | ICD-10-CM | POA: Diagnosis not present

## 2019-08-23 DIAGNOSIS — K219 Gastro-esophageal reflux disease without esophagitis: Secondary | ICD-10-CM | POA: Diagnosis not present

## 2019-08-23 DIAGNOSIS — Z1612 Extended spectrum beta lactamase (ESBL) resistance: Secondary | ICD-10-CM | POA: Diagnosis not present

## 2019-08-23 DIAGNOSIS — R42 Dizziness and giddiness: Secondary | ICD-10-CM | POA: Diagnosis not present

## 2019-08-23 DIAGNOSIS — R0689 Other abnormalities of breathing: Secondary | ICD-10-CM | POA: Diagnosis not present

## 2019-08-23 DIAGNOSIS — K5669 Other partial intestinal obstruction: Secondary | ICD-10-CM | POA: Diagnosis not present

## 2019-08-23 DIAGNOSIS — I4891 Unspecified atrial fibrillation: Secondary | ICD-10-CM | POA: Diagnosis not present

## 2019-08-23 DIAGNOSIS — N390 Urinary tract infection, site not specified: Secondary | ICD-10-CM | POA: Diagnosis not present

## 2019-08-23 DIAGNOSIS — N401 Enlarged prostate with lower urinary tract symptoms: Secondary | ICD-10-CM | POA: Diagnosis not present

## 2019-08-23 DIAGNOSIS — R339 Retention of urine, unspecified: Secondary | ICD-10-CM | POA: Diagnosis not present

## 2019-08-23 DIAGNOSIS — R918 Other nonspecific abnormal finding of lung field: Secondary | ICD-10-CM | POA: Diagnosis not present

## 2019-08-23 DIAGNOSIS — N179 Acute kidney failure, unspecified: Secondary | ICD-10-CM | POA: Diagnosis not present

## 2019-08-23 DIAGNOSIS — K5989 Other specified functional intestinal disorders: Secondary | ICD-10-CM | POA: Diagnosis not present

## 2019-08-23 DIAGNOSIS — A0471 Enterocolitis due to Clostridium difficile, recurrent: Secondary | ICD-10-CM | POA: Diagnosis not present

## 2019-08-23 DIAGNOSIS — A0472 Enterocolitis due to Clostridium difficile, not specified as recurrent: Secondary | ICD-10-CM | POA: Diagnosis not present

## 2019-08-28 DIAGNOSIS — N390 Urinary tract infection, site not specified: Secondary | ICD-10-CM | POA: Diagnosis not present

## 2019-08-28 DIAGNOSIS — M6281 Muscle weakness (generalized): Secondary | ICD-10-CM | POA: Diagnosis not present

## 2019-08-28 DIAGNOSIS — I10 Essential (primary) hypertension: Secondary | ICD-10-CM | POA: Diagnosis not present

## 2019-08-28 DIAGNOSIS — I2510 Atherosclerotic heart disease of native coronary artery without angina pectoris: Secondary | ICD-10-CM | POA: Diagnosis not present

## 2019-08-28 DIAGNOSIS — K5669 Other partial intestinal obstruction: Secondary | ICD-10-CM | POA: Diagnosis not present

## 2019-08-28 DIAGNOSIS — E039 Hypothyroidism, unspecified: Secondary | ICD-10-CM | POA: Diagnosis not present

## 2019-08-28 DIAGNOSIS — I4891 Unspecified atrial fibrillation: Secondary | ICD-10-CM | POA: Diagnosis not present

## 2019-08-28 DIAGNOSIS — A0472 Enterocolitis due to Clostridium difficile, not specified as recurrent: Secondary | ICD-10-CM | POA: Diagnosis not present

## 2019-08-28 DIAGNOSIS — Z9049 Acquired absence of other specified parts of digestive tract: Secondary | ICD-10-CM | POA: Diagnosis not present

## 2019-08-28 DIAGNOSIS — K5090 Crohn's disease, unspecified, without complications: Secondary | ICD-10-CM | POA: Diagnosis not present

## 2019-08-28 DIAGNOSIS — R2689 Other abnormalities of gait and mobility: Secondary | ICD-10-CM | POA: Diagnosis not present

## 2019-08-28 DIAGNOSIS — K56600 Partial intestinal obstruction, unspecified as to cause: Secondary | ICD-10-CM | POA: Diagnosis not present

## 2019-08-28 DIAGNOSIS — K50912 Crohn's disease, unspecified, with intestinal obstruction: Secondary | ICD-10-CM | POA: Diagnosis not present

## 2019-08-28 DIAGNOSIS — N401 Enlarged prostate with lower urinary tract symptoms: Secondary | ICD-10-CM | POA: Diagnosis not present

## 2019-08-28 DIAGNOSIS — R339 Retention of urine, unspecified: Secondary | ICD-10-CM | POA: Diagnosis not present

## 2019-08-28 DIAGNOSIS — R41841 Cognitive communication deficit: Secondary | ICD-10-CM | POA: Diagnosis not present

## 2019-08-28 DIAGNOSIS — E782 Mixed hyperlipidemia: Secondary | ICD-10-CM | POA: Diagnosis not present

## 2019-08-28 DIAGNOSIS — Z1612 Extended spectrum beta lactamase (ESBL) resistance: Secondary | ICD-10-CM | POA: Diagnosis not present

## 2019-08-29 DIAGNOSIS — A0472 Enterocolitis due to Clostridium difficile, not specified as recurrent: Secondary | ICD-10-CM | POA: Diagnosis not present

## 2019-09-01 ENCOUNTER — Ambulatory Visit (INDEPENDENT_AMBULATORY_CARE_PROVIDER_SITE_OTHER): Payer: PPO | Admitting: Internal Medicine

## 2019-09-06 DIAGNOSIS — K5090 Crohn's disease, unspecified, without complications: Secondary | ICD-10-CM | POA: Diagnosis not present

## 2019-09-06 DIAGNOSIS — A0472 Enterocolitis due to Clostridium difficile, not specified as recurrent: Secondary | ICD-10-CM | POA: Diagnosis not present

## 2019-09-15 DIAGNOSIS — E782 Mixed hyperlipidemia: Secondary | ICD-10-CM | POA: Diagnosis not present

## 2019-09-15 DIAGNOSIS — I10 Essential (primary) hypertension: Secondary | ICD-10-CM | POA: Diagnosis not present

## 2019-09-19 DIAGNOSIS — A0472 Enterocolitis due to Clostridium difficile, not specified as recurrent: Secondary | ICD-10-CM | POA: Diagnosis not present

## 2019-09-19 DIAGNOSIS — I2510 Atherosclerotic heart disease of native coronary artery without angina pectoris: Secondary | ICD-10-CM | POA: Diagnosis not present

## 2019-09-21 DIAGNOSIS — K5090 Crohn's disease, unspecified, without complications: Secondary | ICD-10-CM | POA: Diagnosis not present

## 2019-09-21 DIAGNOSIS — N400 Enlarged prostate without lower urinary tract symptoms: Secondary | ICD-10-CM | POA: Diagnosis not present

## 2019-09-21 DIAGNOSIS — Z9049 Acquired absence of other specified parts of digestive tract: Secondary | ICD-10-CM | POA: Diagnosis not present

## 2019-09-21 DIAGNOSIS — E039 Hypothyroidism, unspecified: Secondary | ICD-10-CM | POA: Diagnosis not present

## 2019-09-21 DIAGNOSIS — K219 Gastro-esophageal reflux disease without esophagitis: Secondary | ICD-10-CM | POA: Diagnosis not present

## 2019-09-21 DIAGNOSIS — Z48815 Encounter for surgical aftercare following surgery on the digestive system: Secondary | ICD-10-CM | POA: Diagnosis not present

## 2019-09-21 DIAGNOSIS — Z7901 Long term (current) use of anticoagulants: Secondary | ICD-10-CM | POA: Diagnosis not present

## 2019-09-21 DIAGNOSIS — N181 Chronic kidney disease, stage 1: Secondary | ICD-10-CM | POA: Diagnosis not present

## 2019-09-21 DIAGNOSIS — I4891 Unspecified atrial fibrillation: Secondary | ICD-10-CM | POA: Diagnosis not present

## 2019-09-23 ENCOUNTER — Other Ambulatory Visit: Payer: Self-pay

## 2019-09-23 ENCOUNTER — Ambulatory Visit (INDEPENDENT_AMBULATORY_CARE_PROVIDER_SITE_OTHER): Payer: PPO | Admitting: *Deleted

## 2019-09-23 DIAGNOSIS — I4891 Unspecified atrial fibrillation: Secondary | ICD-10-CM | POA: Diagnosis not present

## 2019-09-23 DIAGNOSIS — Z5181 Encounter for therapeutic drug level monitoring: Secondary | ICD-10-CM | POA: Diagnosis not present

## 2019-09-23 LAB — POCT INR: INR: 5.5 — AB (ref 2.0–3.0)

## 2019-09-23 NOTE — Patient Instructions (Signed)
Hold coumadin tonight and tomorrow night Recheck 09/25/19 Pt not interested in changing to Eliquis at this time

## 2019-09-25 ENCOUNTER — Ambulatory Visit: Payer: PPO | Admitting: *Deleted

## 2019-09-25 ENCOUNTER — Ambulatory Visit (INDEPENDENT_AMBULATORY_CARE_PROVIDER_SITE_OTHER): Payer: PPO | Admitting: Internal Medicine

## 2019-09-25 ENCOUNTER — Encounter (INDEPENDENT_AMBULATORY_CARE_PROVIDER_SITE_OTHER): Payer: Self-pay | Admitting: Internal Medicine

## 2019-09-25 ENCOUNTER — Other Ambulatory Visit (INDEPENDENT_AMBULATORY_CARE_PROVIDER_SITE_OTHER): Payer: Self-pay | Admitting: *Deleted

## 2019-09-25 ENCOUNTER — Encounter (INDEPENDENT_AMBULATORY_CARE_PROVIDER_SITE_OTHER): Payer: Self-pay | Admitting: *Deleted

## 2019-09-25 ENCOUNTER — Other Ambulatory Visit: Payer: Self-pay

## 2019-09-25 VITALS — BP 112/71 | HR 91 | Temp 97.2°F | Ht 68.0 in | Wt 138.0 lb

## 2019-09-25 DIAGNOSIS — Z5181 Encounter for therapeutic drug level monitoring: Secondary | ICD-10-CM

## 2019-09-25 DIAGNOSIS — I4891 Unspecified atrial fibrillation: Secondary | ICD-10-CM | POA: Diagnosis not present

## 2019-09-25 DIAGNOSIS — K50012 Crohn's disease of small intestine with intestinal obstruction: Secondary | ICD-10-CM

## 2019-09-25 DIAGNOSIS — R143 Flatulence: Secondary | ICD-10-CM

## 2019-09-25 DIAGNOSIS — K5090 Crohn's disease, unspecified, without complications: Secondary | ICD-10-CM | POA: Insufficient documentation

## 2019-09-25 DIAGNOSIS — K219 Gastro-esophageal reflux disease without esophagitis: Secondary | ICD-10-CM | POA: Diagnosis not present

## 2019-09-25 DIAGNOSIS — K52838 Other microscopic colitis: Secondary | ICD-10-CM

## 2019-09-25 LAB — POCT INR: INR: 1.9 — AB (ref 2.0–3.0)

## 2019-09-25 MED ORDER — FAMOTIDINE 40 MG PO TABS: 40.0000 mg | ORAL_TABLET | Freq: Two times a day (BID) | ORAL | 2 refills | Status: DC

## 2019-09-25 MED ORDER — METRONIDAZOLE 250 MG PO TABS: 250.0000 mg | ORAL_TABLET | Freq: Three times a day (TID) | ORAL | 0 refills | Status: DC

## 2019-09-25 NOTE — Patient Instructions (Signed)
Warfarin has been on hold since Tuesday Restart warfarin 2.10m daily  Recheck 10/02/19 Pt not interested in changing to Eliquis at this time

## 2019-09-25 NOTE — Patient Instructions (Signed)
Please have your INR checked on 09/29/2019 because metronidazole may make your INR go high. Physician will call the results of blood test and MRI when completed.

## 2019-09-25 NOTE — Progress Notes (Signed)
Reason for consultation  New diagnosis of small bowel Crohn's disease.  History of present illness  Patient is accompanied by his wife and their daughter Sharyn Lull.  Patient is 85 year old Caucasian male who is referred through courtesy of Dr. Festus Aloe Dr. Consuello Masse for GI evaluation. Patient was initially seen by me in December 2012 following 2 bouts of small bowel obstruction.  He underwent study with agile capsule to check for small bowel patency. Since Agile capsule did not reach colon, small bowel Given capsule study was not performed.  Instead he underwent CT enterography which revealed no abnormality to small bowel and instead showed large gastric polyp which was removed endoscopically in January 2013 and turned out to be hyperplastic polyp.  Patient was advised to return for follow-up visit report to emergency room if symptoms recurred.  My next encounter was for colonoscopy in June 2017 with removal of 2 small polyps and only one was tubular adenoma.  He also had mild sigmoid diverticulosis. Review of his records revealed that he had abdominal pelvic CT by Dr. Bronson Ing on 08/10/2017 and no small bowel abnormality was noted.  He did have mildly distended gallbladder with small diverticula felt to be due to chronic outlet obstruction due to enlarged prostate.  Patient states that he has had few episodes of nausea vomiting and abdominal pain and at times diarrhea but responded to medical therapy.  He was admitted to [facility] twice in October 2020 and felt to have small bowel obstruction.  He underwent exploratory laparotomy by Dr. Donell Beers on 08/11/2019 procedure was started as laparoscopy but converted to laparotomy because of some adhesions.  She was noted to have abnormal small bowel with thickening and stricture as well as enlarged lymph node.  She also had Meckel's diverticulum.  28 cm of small bowel was removed along with by CF mesenteric lymph nodes.  Biopsy reveals  active chronic enteritis with patchy transmural inflammation architectural distortion fissuring ulcers and pseudopyloric metaplasia.  These changes are felt to be suggestive of Crohn's disease.  Lymph node biopsy revealed reactive changes.  Plans were made for him to be seen on an outpatient basis at the time of discharge. Patient was readmitted few days later and diagnosed with C. difficile colitis.  He was admitted on 08/21/2019 and discharged to [facility] for rehab.  He was able to come home on 09/20/2019.  According to his daughter last vancomycin dose was on 09/14/2019.  Patient states he feels better but he is still having some issues.  He has tremendous amount of flatulence.  He says he passes a lot of gas at night.  He is getting some relief with simethicone that he is using 4 times a day.  He is having 2-3 bowel movements per day.  Most of his bowel movements are explosive.  He denies melena or rectal bleeding.  He is having intermittent nausea.  He had some nausea over the weekend.  He denies vomiting.  He is also complaining of what he describes gas pains.  His appetite has improved.  He has gained 2 pounds since he was discharged from nursing facility.  He says 3 illness weight was 151 pounds and today he weighs 138 pounds.  He has experiencing daily heartburn and has developed hoarseness. He is using walker to ambulate more so when he is not at home.  He denies fever chills night sweats or shortness of breath.   Current Medications: Outpatient Encounter Medications as of 09/25/2019  Medication  Sig  . acetaminophen (TYLENOL) 500 MG tablet Take 500 mg by mouth every 6 (six) hours as needed.  . carvedilol (COREG) 6.25 MG tablet TAKE ONE TABLET BY MOUTH TWICE DAILY.  Marland Kitchen diltiazem (CARDIZEM CD) 180 MG 24 hr capsule Take 180 mg by mouth 2 (two) times daily.   . finasteride (PROSCAR) 5 MG tablet Take 5 mg by mouth daily.  Marland Kitchen levothyroxine (SYNTHROID, LEVOTHROID) 75 MCG tablet Take 75  mcg by mouth daily before breakfast.  . magnesium gluconate (MAGONATE) 500 MG tablet Take 500 mg by mouth 2 (two) times daily.   . Melatonin 10 MG TABS Take 10 mg by mouth at bedtime.   . nitroGLYCERIN (NITROSTAT) 0.4 MG SL tablet Place 0.4 mg under the tongue every 5 (five) minutes as needed. For chest pains. May repeat for up to 3 doses.  . Simethicone (GAS-X PO) Take by mouth. Patient states that he takes after each meal and at bedtime.  . simvastatin (ZOCOR) 10 MG tablet Take 10 mg by mouth at bedtime.   . Tamsulosin HCl (FLOMAX) 0.4 MG CAPS Take 0.4 mg by mouth 2 (two) times daily.   Marland Kitchen warfarin (COUMADIN) 2.5 MG tablet Take 2.5 mg by mouth as directed. As directed by coumadin clinic    No facility-administered encounter medications on file as of 09/25/2019.   Past medical history  Hypothyroidism Atrial fibrillation BPH Large hyperplastic gastric polyp removed from stomach in January 2013. Hypomagnesemia. History of recurrent small bowel obstruction with multiple admissions and recent diagnosis of small bowel Crohn's disease. History of colonic adenoma.  Laparoscopic cholecystectomy in 2010.  He had gallstones. Laparotomy on 08/11/2019 with resection of 28 cm of small bowel and mesenteric lymph node biopsy.  Pathology confirmed Crohn's disease.   Allergies  No Known Allergies  Family history  Father had coronary artery disease and died of MI age age 77.  Mother was diagnosed with colon carcinoma at age 62 and died at 14.  He has 1 brother living.  He is 40 years old.  He has a pacemaker.  He lost 1 brother of coronary artery disease at age 14 and lost another brother of sudden death at 72(half brother).  Social history  He is married.  They have 1 daughter age 39 and she has ulcerative colitis. He retired in 1991.  He worked for [employer] for 32 years and has [place] for a for 5 years and then so that [place] for 5 years. He has never smoked  cigarettes or drank alcohol.  Physical examination Blood pressure 112/71, pulse 91, temperature (!) 97.2 F (36.2 C), temperature source Oral, height 5' 8"  (1.727 m), weight 138 lb (62.6 kg). Patient is alert and in no acute distress.  He is using walker to ambulate.  He is quite thin. He is using facial mask. Conjunctiva is pink. Sclera is nonicteric Oropharyngeal mucosa is normal. No neck masses or thyromegaly noted. Cardiac exam with regular rhythm normal S1 and S2. No murmur or gallop noted. Lungs are clear to auscultation. Abdomen is symmetrical with fullness across upper abdomen.  He has midline scar.  Bowel sounds are normal.  On palpation abdomen is soft without tenderness organomegaly or masses but very tympanic across upper abdomen. Rectal examination deferred. He has trace edema around right ankle.  He does not have clubbing or koilonychia.  Labs/studies Results: No recent lab or imaging studies are available for review. I was able to review CT images from 08/10/2017 and there was  no abnormality to small or large bowel.  Assessment:  #1.  Small bowel Crohn's disease.  Patient has history of intermittent small bowel obstruction dating back to 2012 but work-up was negative until admission of 6 weeks ago when he underwent laparotomy with small bowel resection and lymph node biopsy and diagnosed with Crohn's disease.  All of his small bowel obstructions would not appear to be due to Crohn's disease as it is highly unlikely that it would go undiagnosed for so many years with a negative CTs. some of his prior episodes could have been obstruction due to adhesions or gastroenteritis. It appears all of his diseased segment of small bowel has been removed.  He needs to be reassessed with imaging to determine if he has remaining disease which would guide [place] in his management.  #2.  Excessive flatulence.  He is very tympanic across upper abdomen.  I am concerned that he may have small  intestinal bacterial overgrowth and he would therefore benefit from a short course of antibiotic.  INR will have to be watched closely while on antibiotic therapy since he is on warfarin.  #3.  GERD.  He is having daily heartburn and he has hoarseness.  We will treat him aggressively for the next 8 weeks or so.  Will hold off PPI because of recent history of C. difficile and also the fact that he is on warfarin.  Recommendations  Patient will go to the lab for CBC with differential, CRP and comprehensive chemistry panel. Begin famotidine 40 mg by mouth 30 minutes before breakfast and evening meal daily. MR enterography to be scheduled as soon as possible. Patient will have INR checked at coagulation clinic on 07/30/2019. Warfarin dose may have to be adjusted even prior to that and I will get Dr. Court Joy office to help. I will be contacting patient with results of blood test and MRI when completed. He will be returning for office visit in 1 month.

## 2019-09-26 ENCOUNTER — Telehealth: Payer: Self-pay | Admitting: *Deleted

## 2019-09-26 LAB — CBC WITH DIFFERENTIAL/PLATELET
Absolute Monocytes: 713 cells/uL (ref 200–950)
Basophils Absolute: 62 cells/uL (ref 0–200)
Basophils Relative: 0.5 %
Eosinophils Absolute: 185 cells/uL (ref 15–500)
Eosinophils Relative: 1.5 %
HCT: 37.1 % — ABNORMAL LOW (ref 38.5–50.0)
Hemoglobin: 12.2 g/dL — ABNORMAL LOW (ref 13.2–17.1)
Lymphs Abs: 1956 cells/uL (ref 850–3900)
MCH: 29.3 pg (ref 27.0–33.0)
MCHC: 32.9 g/dL (ref 32.0–36.0)
MCV: 89.2 fL (ref 80.0–100.0)
MPV: 9.5 fL (ref 7.5–12.5)
Monocytes Relative: 5.8 %
Neutro Abs: 9385 cells/uL — ABNORMAL HIGH (ref 1500–7800)
Neutrophils Relative %: 76.3 %
Platelets: 258 10*3/uL (ref 140–400)
RBC: 4.16 10*6/uL — ABNORMAL LOW (ref 4.20–5.80)
RDW: 13.7 % (ref 11.0–15.0)
Total Lymphocyte: 15.9 %
WBC: 12.3 10*3/uL — ABNORMAL HIGH (ref 3.8–10.8)

## 2019-09-26 LAB — COMPREHENSIVE METABOLIC PANEL
AG Ratio: 1.5 (calc) (ref 1.0–2.5)
ALT: 21 U/L (ref 9–46)
AST: 16 U/L (ref 10–35)
Albumin: 3.4 g/dL — ABNORMAL LOW (ref 3.6–5.1)
Alkaline phosphatase (APISO): 76 U/L (ref 35–144)
BUN: 15 mg/dL (ref 7–25)
CO2: 27 mmol/L (ref 20–32)
Calcium: 8.4 mg/dL — ABNORMAL LOW (ref 8.6–10.3)
Chloride: 102 mmol/L (ref 98–110)
Creat: 0.95 mg/dL (ref 0.70–1.11)
Globulin: 2.3 g/dL (calc) (ref 1.9–3.7)
Glucose, Bld: 97 mg/dL (ref 65–139)
Potassium: 4.3 mmol/L (ref 3.5–5.3)
Sodium: 137 mmol/L (ref 135–146)
Total Bilirubin: 0.4 mg/dL (ref 0.2–1.2)
Total Protein: 5.7 g/dL — ABNORMAL LOW (ref 6.1–8.1)

## 2019-09-26 LAB — C-REACTIVE PROTEIN: CRP: 6.3 mg/L (ref <8.0)

## 2019-09-26 NOTE — Telephone Encounter (Signed)
Dr Laural Golden called and prescribed pt Flagyl 250 tid and was going to decrease coumadin by half and wanted to make sure this was ok - please call Dr Laural Golden with recs cell # [phone]

## 2019-09-26 NOTE — Telephone Encounter (Signed)
Discussed pt's instructions with pharm D. Called an informed pt to continue to take 1 tablet (2.5 mg) of Warfain daily except for 1/2 a tablet (1.25 mg) on Sunday and got to appointment on 12/14 to get his INR checked.  Dr. Laural Golden called back - made him aware that we spoke with Pt and went ahead and gave pt dosing instructions for Warfarin over the weekend.

## 2019-09-26 NOTE — Telephone Encounter (Signed)
Called Dr. Laural Golden at [phone], [facility] for her to call Coumadin Clinic back.

## 2019-09-29 ENCOUNTER — Ambulatory Visit (HOSPITAL_COMMUNITY)
Admission: RE | Admit: 2019-09-29 | Discharge: 2019-09-29 | Disposition: A | Discharge status: Home / Self Care | Payer: PPO | Source: Ambulatory Visit | Attending: Internal Medicine | Admitting: Internal Medicine

## 2019-09-29 ENCOUNTER — Other Ambulatory Visit: Payer: Self-pay

## 2019-09-29 ENCOUNTER — Ambulatory Visit (INDEPENDENT_AMBULATORY_CARE_PROVIDER_SITE_OTHER): Payer: PPO | Admitting: *Deleted

## 2019-09-29 DIAGNOSIS — N281 Cyst of kidney, acquired: Secondary | ICD-10-CM | POA: Diagnosis not present

## 2019-09-29 DIAGNOSIS — K50012 Crohn's disease of small intestine with intestinal obstruction: Secondary | ICD-10-CM | POA: Diagnosis not present

## 2019-09-29 DIAGNOSIS — K52838 Other microscopic colitis: Secondary | ICD-10-CM | POA: Diagnosis not present

## 2019-09-29 DIAGNOSIS — Z5181 Encounter for therapeutic drug level monitoring: Secondary | ICD-10-CM

## 2019-09-29 DIAGNOSIS — I4891 Unspecified atrial fibrillation: Secondary | ICD-10-CM | POA: Diagnosis not present

## 2019-09-29 DIAGNOSIS — K9189 Other postprocedural complications and disorders of digestive system: Secondary | ICD-10-CM | POA: Diagnosis not present

## 2019-09-29 DIAGNOSIS — K5730 Diverticulosis of large intestine without perforation or abscess without bleeding: Secondary | ICD-10-CM | POA: Diagnosis not present

## 2019-09-29 DIAGNOSIS — K449 Diaphragmatic hernia without obstruction or gangrene: Secondary | ICD-10-CM | POA: Diagnosis not present

## 2019-09-29 DIAGNOSIS — D734 Cyst of spleen: Secondary | ICD-10-CM | POA: Diagnosis not present

## 2019-09-29 LAB — POCT INR: INR: 1.7 — AB (ref 2.0–3.0)

## 2019-09-29 MED ORDER — GADOBUTROL 1 MMOL/ML IV SOLN
10.0000 mL | Freq: Once | INTRAVENOUS | Status: AC | PRN
  Administered 2019-09-29: 10 mL via INTRAVENOUS

## 2019-09-29 MED ORDER — GLUCAGON HCL RDNA (DIAGNOSTIC) 1 MG IJ SOLR: 1.0000 mg | Freq: Once | INTRAMUSCULAR | Status: AC

## 2019-09-29 MED ORDER — BARIUM SULFATE 0.1 % PO SUSP
ORAL | Status: AC
  Filled 2019-09-29: qty 3

## 2019-09-29 MED ORDER — GLUCAGON HCL RDNA (DIAGNOSTIC) 1 MG IJ SOLR
INTRAMUSCULAR | Status: AC
  Administered 2019-09-29: 1 mg via INTRAVENOUS
  Filled 2019-09-29: qty 1

## 2019-09-29 NOTE — Patient Instructions (Signed)
Started Flagyl 12/11.  Was told to take warfarin 1 tablet daily except 1/2 tablet last night till INR check today Continue 2.78m daily  Recheck 10/02/19 Pending MRI of abdomen and pelvis today Pt not interested in changing to Eliquis at this time

## 2019-10-01 DIAGNOSIS — R5383 Other fatigue: Secondary | ICD-10-CM | POA: Diagnosis not present

## 2019-10-01 DIAGNOSIS — I482 Chronic atrial fibrillation, unspecified: Secondary | ICD-10-CM | POA: Diagnosis not present

## 2019-10-01 DIAGNOSIS — A0472 Enterocolitis due to Clostridium difficile, not specified as recurrent: Secondary | ICD-10-CM | POA: Diagnosis not present

## 2019-10-01 DIAGNOSIS — R300 Dysuria: Secondary | ICD-10-CM | POA: Diagnosis not present

## 2019-10-01 DIAGNOSIS — K56609 Unspecified intestinal obstruction, unspecified as to partial versus complete obstruction: Secondary | ICD-10-CM | POA: Diagnosis not present

## 2019-10-01 DIAGNOSIS — Z6821 Body mass index (BMI) 21.0-21.9, adult: Secondary | ICD-10-CM | POA: Diagnosis not present

## 2019-10-02 ENCOUNTER — Telehealth (INDEPENDENT_AMBULATORY_CARE_PROVIDER_SITE_OTHER): Payer: Self-pay | Admitting: Internal Medicine

## 2019-10-02 ENCOUNTER — Other Ambulatory Visit: Payer: Self-pay

## 2019-10-02 ENCOUNTER — Other Ambulatory Visit (INDEPENDENT_AMBULATORY_CARE_PROVIDER_SITE_OTHER): Payer: Self-pay | Admitting: Internal Medicine

## 2019-10-02 ENCOUNTER — Ambulatory Visit (INDEPENDENT_AMBULATORY_CARE_PROVIDER_SITE_OTHER): Payer: PPO | Admitting: *Deleted

## 2019-10-02 DIAGNOSIS — K56699 Other intestinal obstruction unspecified as to partial versus complete obstruction: Secondary | ICD-10-CM

## 2019-10-02 DIAGNOSIS — I4891 Unspecified atrial fibrillation: Secondary | ICD-10-CM | POA: Diagnosis not present

## 2019-10-02 DIAGNOSIS — Z5181 Encounter for therapeutic drug level monitoring: Secondary | ICD-10-CM | POA: Diagnosis not present

## 2019-10-02 LAB — POCT INR: INR: 2.3 (ref 2.0–3.0)

## 2019-10-02 NOTE — Telephone Encounter (Signed)
Please let the patient know that Dr.Rehman will call with the results when they are ready.

## 2019-10-02 NOTE — Telephone Encounter (Signed)
Patient left voice mail message regarding test results - ph# [phone]

## 2019-10-02 NOTE — Patient Instructions (Signed)
Started Flagyl 12/11.  Will finish Sunday. Continue 2.48m daily  Recheck 10/16/19 Pending results of Urine Culture.  Will call with name of antibiotic. Pt not interested in changing to Eliquis at this time

## 2019-10-06 ENCOUNTER — Ambulatory Visit (HOSPITAL_COMMUNITY)
Admission: RE | Admit: 2019-10-06 | Discharge: 2019-10-06 | Disposition: A | Discharge status: Home / Self Care | Payer: PPO | Source: Ambulatory Visit | Attending: Internal Medicine | Admitting: Internal Medicine

## 2019-10-06 ENCOUNTER — Other Ambulatory Visit (INDEPENDENT_AMBULATORY_CARE_PROVIDER_SITE_OTHER): Payer: Self-pay | Admitting: Internal Medicine

## 2019-10-06 ENCOUNTER — Other Ambulatory Visit: Payer: Self-pay

## 2019-10-06 DIAGNOSIS — K56609 Unspecified intestinal obstruction, unspecified as to partial versus complete obstruction: Secondary | ICD-10-CM | POA: Diagnosis not present

## 2019-10-06 DIAGNOSIS — K56699 Other intestinal obstruction unspecified as to partial versus complete obstruction: Secondary | ICD-10-CM

## 2019-10-06 MED ORDER — BUDESONIDE 3 MG PO CPEP: 3.0000 mg | ORAL_CAPSULE | Freq: Every day | ORAL | 0 refills | Status: DC

## 2019-10-06 MED ORDER — MESALAMINE ER 500 MG PO CPCR: 500.0000 mg | ORAL_CAPSULE | Freq: Four times a day (QID) | ORAL | 5 refills | Status: DC

## 2019-10-07 ENCOUNTER — Telehealth: Payer: Self-pay | Admitting: Urology

## 2019-10-07 NOTE — Telephone Encounter (Signed)
Spoke with wife and pt. Scheduled in jan 6th for pt

## 2019-10-07 NOTE — Telephone Encounter (Signed)
Pt wife called to make an appt. He was oriiginally supposed to have a 6 mo in [place]. I scheduled him next available, but he wishes to be seen sooner due to symptoms she did not specify. Wishes for a phone call or an earlier appt. Can you guide me on whether we can get him in sooner or is this a task you can make a phone call on.

## 2019-10-14 ENCOUNTER — Telehealth: Payer: Self-pay | Admitting: Urology

## 2019-10-14 NOTE — Telephone Encounter (Signed)
Pt called and said he is having problems with frequency in urination at night, he has appt on 1/5 and wants to know if something can be prescribed to ease the discomfort until he can make it to his appt.

## 2019-10-15 ENCOUNTER — Other Ambulatory Visit (HOSPITAL_COMMUNITY)
Admission: RE | Admit: 2019-10-15 | Discharge: 2019-10-15 | Disposition: A | Discharge status: Home / Self Care | Payer: PPO | Source: Ambulatory Visit | Attending: Urology | Admitting: Urology

## 2019-10-15 ENCOUNTER — Ambulatory Visit (INDEPENDENT_AMBULATORY_CARE_PROVIDER_SITE_OTHER): Payer: PPO | Admitting: Urology

## 2019-10-15 ENCOUNTER — Other Ambulatory Visit: Payer: Self-pay

## 2019-10-15 DIAGNOSIS — R339 Retention of urine, unspecified: Secondary | ICD-10-CM | POA: Diagnosis not present

## 2019-10-15 NOTE — Progress Notes (Signed)
Returned pt call today of complaints of increased frequency and very  Little output. Spoke with Dr. Alyson Ingles. Pt came in for pvr - scanned revealed 326. Orders received for foley placement.  Simple Catheter Placement  Due to urinary retention patient is present today for a foley cath placement.  Patient was cleaned and prepped in a sterile fashion with betadine A 16coude FR foley catheter was inserted, urine return was noted  454m, urine was yellow purulent  in color.  The balloon was filled with 10cc of sterile water.  A bedside bag was attached for drainage. Patient was also given a night bag to take home and was given instruction on how to change from one bag to another.  Patient was given instruction on proper catheter care.  Patient tolerated well, no complications were noted   Performed by: AGibson RampRN  Additional notes/ Follow up: pt will f/u Tuesday with Dr. DDiona Fanti

## 2019-10-16 ENCOUNTER — Ambulatory Visit (INDEPENDENT_AMBULATORY_CARE_PROVIDER_SITE_OTHER): Payer: PPO | Admitting: *Deleted

## 2019-10-16 ENCOUNTER — Other Ambulatory Visit: Payer: Self-pay

## 2019-10-16 DIAGNOSIS — Z5181 Encounter for therapeutic drug level monitoring: Secondary | ICD-10-CM | POA: Diagnosis not present

## 2019-10-16 DIAGNOSIS — I4891 Unspecified atrial fibrillation: Secondary | ICD-10-CM

## 2019-10-16 LAB — POCT INR: INR: 1.8 — AB (ref 2.0–3.0)

## 2019-10-16 LAB — URINE CULTURE

## 2019-10-16 NOTE — Patient Instructions (Signed)
Take warfarin 2 tablets today then resume 1 tablet daily.  Recheck 11/03/19 Has indwelling foley catheter at present time.  No UTI Pt not interested in changing to Eliquis at this time

## 2019-10-16 NOTE — Telephone Encounter (Signed)
Pt came by office on Wednesday please see encounter.

## 2019-10-20 DIAGNOSIS — Z6821 Body mass index (BMI) 21.0-21.9, adult: Secondary | ICD-10-CM | POA: Diagnosis not present

## 2019-10-20 DIAGNOSIS — R300 Dysuria: Secondary | ICD-10-CM | POA: Diagnosis not present

## 2019-10-20 DIAGNOSIS — R5383 Other fatigue: Secondary | ICD-10-CM | POA: Diagnosis not present

## 2019-10-20 DIAGNOSIS — K56609 Unspecified intestinal obstruction, unspecified as to partial versus complete obstruction: Secondary | ICD-10-CM | POA: Diagnosis not present

## 2019-10-20 DIAGNOSIS — I482 Chronic atrial fibrillation, unspecified: Secondary | ICD-10-CM | POA: Diagnosis not present

## 2019-10-20 DIAGNOSIS — A0472 Enterocolitis due to Clostridium difficile, not specified as recurrent: Secondary | ICD-10-CM | POA: Diagnosis not present

## 2019-10-21 ENCOUNTER — Other Ambulatory Visit: Payer: Self-pay

## 2019-10-21 ENCOUNTER — Ambulatory Visit (INDEPENDENT_AMBULATORY_CARE_PROVIDER_SITE_OTHER): Payer: PPO | Admitting: Urology

## 2019-10-21 ENCOUNTER — Telehealth: Payer: Self-pay | Admitting: Urology

## 2019-10-21 ENCOUNTER — Encounter: Payer: Self-pay | Admitting: Urology

## 2019-10-21 VITALS — BP 130/82 | HR 71 | Temp 96.4°F | Ht 68.0 in | Wt 135.0 lb

## 2019-10-21 DIAGNOSIS — Z48815 Encounter for surgical aftercare following surgery on the digestive system: Secondary | ICD-10-CM | POA: Diagnosis not present

## 2019-10-21 DIAGNOSIS — K5090 Crohn's disease, unspecified, without complications: Secondary | ICD-10-CM | POA: Diagnosis not present

## 2019-10-21 DIAGNOSIS — R338 Other retention of urine: Secondary | ICD-10-CM | POA: Diagnosis not present

## 2019-10-21 DIAGNOSIS — N400 Enlarged prostate without lower urinary tract symptoms: Secondary | ICD-10-CM | POA: Diagnosis not present

## 2019-10-21 DIAGNOSIS — Z7901 Long term (current) use of anticoagulants: Secondary | ICD-10-CM | POA: Diagnosis not present

## 2019-10-21 DIAGNOSIS — Z9049 Acquired absence of other specified parts of digestive tract: Secondary | ICD-10-CM | POA: Diagnosis not present

## 2019-10-21 DIAGNOSIS — I4891 Unspecified atrial fibrillation: Secondary | ICD-10-CM | POA: Diagnosis not present

## 2019-10-21 DIAGNOSIS — N401 Enlarged prostate with lower urinary tract symptoms: Secondary | ICD-10-CM | POA: Diagnosis not present

## 2019-10-21 DIAGNOSIS — E039 Hypothyroidism, unspecified: Secondary | ICD-10-CM | POA: Diagnosis not present

## 2019-10-21 DIAGNOSIS — K219 Gastro-esophageal reflux disease without esophagitis: Secondary | ICD-10-CM | POA: Diagnosis not present

## 2019-10-21 DIAGNOSIS — N181 Chronic kidney disease, stage 1: Secondary | ICD-10-CM | POA: Diagnosis not present

## 2019-10-21 MED ORDER — CEPHALEXIN 500 MG PO CAPS: 500.0000 mg | ORAL_CAPSULE | Freq: Two times a day (BID) | ORAL | 0 refills | Status: DC

## 2019-10-21 NOTE — Progress Notes (Signed)
H&P  Chief Complaint: Urinary Retention  History of Present Illness:   1.5.2021: Here today having had recent episode of urinary retention -- he has been on indwelling catheter since last week (12.30.2020). Prior to this retentive episode, he reports having had severely increased nighttime urinary frequency (q hr). He continues on finasteride and 2x tamsulosin and reports he has been very compliant with these meds. He denies any recent constipation. Previously he has had issues with incomplete bladder emptying. Of note, he had extensive GI surgery 3 mo's prior.  (below copied from [facility] records):  CC:  Simple kidney cyst. HPI: [name] is a 86 year-old male established patient who is here for a simple renal cyst.  The problem is on the left side.   7.14.20: Hospitalized on 5.28.20 for bowel obstruction -- CT showed incidental finding of spot on left kidney that was followed-up with U/S and diagnosed as a simple cyst. He has been on Flomax for 10 years and is fairly satisfied with his urination but has to still get up 3x during the night to urinate. He denies any recent UTI or gross hematuria. Scans revealed that his left kidney is obstructed with hydronephrosis.   9.22.2020: He denies any changes in urinary sx's/pattern or any new pain.   CC: Hydronephrosis. HPI: The problem is on the left side. He had the following x-rays done: Renal Ultrasound and CT Scan.   7.14.20: On recent CT and ultrasound an abnormality was found on left kidney. This was evaluated w/ renal U/S which did not see hydro. He denies any changes in urination aside from sx's he has been taking Flomax to manage for 10 years.   Prostate size estimated to be 96 ml.   9.22.2020: He continues to deny any major urinary sx's.   CC: BPH New Patient HPI: Patient is currently treated with Flomax for his symptoms.   7.14.2020: Finasteride added to tamsulosin regimen.   9.22.20202: He continues on the above noted medical regimen  with no real changes to urinary sx's -- he tolerates this well. He only reports mild sx's.    Past Medical History:  Diagnosis Date  . Atrial fibrillation ([place])    Permanent  . CAD (coronary artery disease)    Catheterization 2004, mild/moderate nonobstructive disease  /   nuclear, 2007, small inferior scar // no ischemia  . Carotid bruit    Doppler, December 23, 2010, no significant carotid disease  . Clot    LA, small, in past  . Dizziness 03/2010   June, 2011   Mild, stop digoxin, felt better  . Ejection fraction 02/2009   EF 55-60%,Echo, May, 2010  . Elevated PSA   . GERD (gastroesophageal reflux disease)   . LVH (left ventricular hypertrophy) 02/2009    Mild, echo, 2010  . Mitral regurgitation    Mild, echo, flat closure  . Nausea vomiting and diarrhea 01/2010   Hospitalization, probably viral (or partial small bowel obstruction)/ Hospital 09/2010, recurrent possible partial small bowel obstruction, medical therapy  . Other malaise and fatigue   . Warfarin anticoagulation    Atrial fibrillation    Past Surgical History:  Procedure Laterality Date  . AGILE CAPSULE  10/18/2011   Procedure: AGILE CAPSULE;  Surgeon: Rogene Houston, MD;  Location: [facility];  Service: Endoscopy;  Laterality: N/A;  730  . CARDIAC CATHETERIZATION  2004  . CHOLECYSTECTOMY  2010   Dr. Anthony Sar  . COLONOSCOPY  2008   [facility]  . COLONOSCOPY N/A  03/23/2016   Procedure: COLONOSCOPY;  Surgeon: Rogene Houston, MD;  Location: [facility];  Service: Endoscopy;  Laterality: N/A;  1:00  . ESOPHAGOGASTRODUODENOSCOPY  11/08/2011   Procedure: ESOPHAGOGASTRODUODENOSCOPY (EGD);  Surgeon: Rogene Houston, MD;  Location: [facility];  Service: Endoscopy;  Laterality: N/A;  300  . POLYPECTOMY  03/23/2016   Procedure: POLYPECTOMY;  Surgeon: Rogene Houston, MD;  Location: [facility];  Service: Endoscopy;;  Cecal polyp removed via cold forceps recto-sigmoid polyp removed via cold snare    Home Medications:    Allergies as of 10/21/2019   No Known Allergies     Medication List       Accurate as of October 21, 2019 11:48 AM. If you have any questions, ask your nurse or doctor.        STOP taking these medications   diltiazem 180 MG 24 hr capsule Commonly known as: TIAZAC Stopped by: Jorja Loa, MD   mesalamine 500 MG CR capsule Commonly known as: PENTASA Stopped by: Jorja Loa, MD     TAKE these medications   acetaminophen 500 MG tablet Commonly known as: TYLENOL Take 500 mg by mouth every 6 (six) hours as needed.   ALIGN PREBIOTIC-PROBIOTIC PO Take by mouth.   budesonide 3 MG 24 hr capsule Commonly known as: ENTOCORT EC Take 1 capsule (3 mg total) by mouth daily.   carvedilol 6.25 MG tablet Commonly known as: COREG TAKE ONE TABLET BY MOUTH TWICE DAILY.   diltiazem 180 MG 24 hr capsule Commonly known as: CARDIZEM CD Take 180 mg by mouth 2 (two) times daily.   famotidine 20 MG tablet Commonly known as: PEPCID Take 40 mg by mouth at bedtime.   famotidine 40 MG tablet Commonly known as: PEPCID Take 1 tablet (40 mg total) by mouth 2 (two) times daily.   finasteride 5 MG tablet Commonly known as: PROSCAR Take 5 mg by mouth daily.   GAS-X PO Take by mouth. Patient states that he takes after each meal and at bedtime.   levothyroxine 75 MCG tablet Commonly known as: SYNTHROID Take 75 mcg by mouth daily before breakfast.   magnesium gluconate 500 MG tablet Commonly known as: MAGONATE Take 500 mg by mouth 2 (two) times daily.   Melatonin 10 MG Tabs Take 10 mg by mouth at bedtime.   nitroGLYCERIN 0.4 MG SL tablet Commonly known as: NITROSTAT Place 0.4 mg under the tongue every 5 (five) minutes as needed. For chest pains. May repeat for up to 3 doses.   simvastatin 10 MG tablet Commonly known as: ZOCOR Take 10 mg by mouth at bedtime.   tamsulosin 0.4 MG Caps capsule Commonly known as: FLOMAX Take 0.4 mg by mouth 2 (two) times daily.    warfarin 2.5 MG tablet Commonly known as: COUMADIN Take as directed by the anticoagulation clinic. If you are unsure how to take this medication, talk to your nurse or doctor. Original instructions: Take 2.5 mg by mouth as directed. As directed by coumadin clinic       Allergies: No Known Allergies  Family History  Problem Relation Age of Onset  . Colon cancer Mother   . Heart disease Father   . Stroke Brother   . Healthy Daughter     Social History:  reports that he has never smoked. He has never used smokeless tobacco. He reports that he does not drink alcohol or use drugs.  ROS: A complete review of systems was performed.  All systems are negative except for pertinent findings as noted.  Physical Exam:  Vital signs in last 24 hours: BP 130/82   Pulse 71   Temp (!) 96.4 F (35.8 C)   Ht 5' 8"  (1.727 m)   Wt 135 lb (61.2 kg)   BMI 20.53 kg/m  Constitutional:  Alert and oriented, No acute distress Cardiovascular: Regular rate  Respiratory: Normal respiratory effort Neurologic: Grossly intact, no focal deficits Psychiatric: Normal mood and affect  Laboratory Data:  No results for input(s): WBC, HGB, HCT, PLT in the last 72 hours.  No results for input(s): NA, K, CL, GLUCOSE, BUN, CALCIUM, CREATININE in the last 72 hours.  Invalid input(s): CO3   No results found for this or any previous visit (from the past 24 hour(s)). Recent Results (from the past 240 hour(s))  Urine culture     Status: Abnormal   Collection Time: 10/15/19  4:40 PM   Specimen: Urine, Clean Catch  Result Value Ref Range Status   Specimen Description   Final    URINE, CLEAN CATCH Performed at [facility], [address] [place], [place] [address]    Special Requests   Final    NONE Performed at [facility], [address], [place],  [address]    Culture MULTIPLE SPECIES PRESENT, SUGGEST RECOLLECTION (A)  Final   Report Status 10/16/2019 FINAL  Final    Renal Function: No  results for input(s): CREATININE in the last 168 hours. CrCl cannot be calculated (Patient's most recent lab result is older than the maximum 21 days allowed.).  Radiologic Imaging: No results found.  Impression/Assessment:  BPH w/ retention, although residual volume @ visit w/ catheter placement less  Than previous pvr Plan:  1. I sent in keflex  2. Continue q 12 hr flomax/ also finasteride  3. OV 3 days to check pv U/S, sooner if he cannot void adequately

## 2019-10-21 NOTE — Progress Notes (Signed)
Fill and Pull Catheter Removal  Patient is present today for a catheter removal.  Patient was cleaned and prepped in a sterile fashion 295m of sterile water/ saline was instilled into the bladder when the patient felt the urge to urinate. 164mof water was then drained from the balloon.  A 16FR foley cath was removed from the bladder no complications were noted .  Patient as then given some time to void on their own.  Patient cannot void. Pt. Instructed to drink plenty of fluids and return this afternoon for pvr. Performed by: Meaghen Vecchiarelli LPN

## 2019-10-21 NOTE — Telephone Encounter (Signed)
Pt was able to urinate twice this afternnoon.

## 2019-10-22 ENCOUNTER — Other Ambulatory Visit (HOSPITAL_COMMUNITY)
Admission: RE | Admit: 2019-10-22 | Discharge: 2019-10-22 | Disposition: A | Discharge status: Home / Self Care | Payer: PPO | Source: Other Acute Inpatient Hospital | Attending: Urology | Admitting: Urology

## 2019-10-22 ENCOUNTER — Ambulatory Visit (INDEPENDENT_AMBULATORY_CARE_PROVIDER_SITE_OTHER): Payer: PPO | Admitting: Urology

## 2019-10-22 VITALS — Temp 98.1°F

## 2019-10-22 DIAGNOSIS — R339 Retention of urine, unspecified: Secondary | ICD-10-CM | POA: Insufficient documentation

## 2019-10-22 LAB — BLADDER SCAN AMB NON-IMAGING: Scan Result: 430.3

## 2019-10-22 NOTE — Progress Notes (Signed)
Simple Catheter Placement  Due to urinary retention patient is present today for a foley cath placement.  Patient was cleaned and prepped in a sterile fashion with betadine and lidocaine jelly 2% was instilled into the urethra.  A 16 FR foley catheter was inserted, urine return was noted  511m, urine was dk yellow in color.  The balloon was filled with 10cc of sterile water.  A bedside bag was attached for drainage.  Patient was given instruction on proper catheter care.  Patient tolerated well, no complications were noted   Performed by: [email or web address],lpn  Additional notes/ Follow up: 1 wk return for voiding trial

## 2019-10-23 LAB — URINE CULTURE: Culture: NO GROWTH

## 2019-10-24 ENCOUNTER — Ambulatory Visit: Payer: PPO | Admitting: Urology

## 2019-10-28 ENCOUNTER — Ambulatory Visit (INDEPENDENT_AMBULATORY_CARE_PROVIDER_SITE_OTHER): Payer: PPO | Admitting: Urology

## 2019-10-28 ENCOUNTER — Encounter: Payer: Self-pay | Admitting: Urology

## 2019-10-28 ENCOUNTER — Other Ambulatory Visit: Payer: Self-pay

## 2019-10-28 DIAGNOSIS — R339 Retention of urine, unspecified: Secondary | ICD-10-CM

## 2019-10-28 MED ORDER — CIPROFLOXACIN HCL 500 MG PO TABS
500.0000 mg | ORAL_TABLET | Freq: Once | ORAL | Status: AC
  Administered 2019-10-28: 500 mg via ORAL

## 2019-10-28 NOTE — Progress Notes (Signed)
Fill and Pull Catheter Removal  Patient is present today for a catheter removal.  Patient was cleaned and prepped in a sterile fashion 250 ml of sterile water/ saline was instilled into the bladder when the patient felt the urge to urinate. 30m of water was then drained from the balloon.  A 16FR foley cath was removed from the bladder no complications were noted .  Patient as then given some time to void on their own.  Patient can void  537mon their own after some time.  Patient tolerated well.  Performed by: Hope Cobb. LPM  Follow up/ Additional notes:  Dr. DaDiona Fantio evaluate  Uroflow  Peak Flow: 86m25mverage Flow: 86ml54mided Volume: 57ml31mding Time: 39sec Flow Time: 23sec Time to Peak Flow: 1sec

## 2019-10-29 ENCOUNTER — Ambulatory Visit: Payer: PPO | Admitting: Urology

## 2019-10-29 ENCOUNTER — Ambulatory Visit: Payer: PPO

## 2019-10-29 ENCOUNTER — Encounter: Payer: Self-pay | Admitting: Urology

## 2019-10-29 NOTE — Progress Notes (Signed)
H&P  Chief Complaint: Difficulty voiding  History of Present Illness: Pt returns for TOV--on dual medical Rx.  Past Medical History:  Diagnosis Date  . Atrial fibrillation ([place])    Permanent  . CAD (coronary artery disease)    Catheterization 2004, mild/moderate nonobstructive disease  /   nuclear, 2007, small inferior scar // no ischemia  . Carotid bruit    Doppler, December 23, 2010, no significant carotid disease  . Clot    LA, small, in past  . Dizziness 03/2010   June, 2011   Mild, stop digoxin, felt better  . Ejection fraction 02/2009   EF 55-60%,Echo, May, 2010  . Elevated PSA   . GERD (gastroesophageal reflux disease)   . LVH (left ventricular hypertrophy) 02/2009    Mild, echo, 2010  . Mitral regurgitation    Mild, echo, flat closure  . Nausea vomiting and diarrhea 01/2010   Hospitalization, probably viral (or partial small bowel obstruction)/ Hospital 09/2010, recurrent possible partial small bowel obstruction, medical therapy  . Other malaise and fatigue   . Warfarin anticoagulation    Atrial fibrillation    Past Surgical History:  Procedure Laterality Date  . AGILE CAPSULE  10/18/2011   Procedure: AGILE CAPSULE;  Surgeon: Rogene Houston, MD;  [facility];  Service: Endoscopy;  Laterality: N/A;  730  . CARDIAC CATHETERIZATION  2004  . CHOLECYSTECTOMY  2010   Dr. Anthony Sar  . COLONOSCOPY  2008   [facility]  . COLONOSCOPY N/A 03/23/2016   Procedure: COLONOSCOPY;  Surgeon: Rogene Houston, MD;  [facility]: [facility];  Service: Endoscopy;  Laterality: N/A;  1:00  . ESOPHAGOGASTRODUODENOSCOPY  11/08/2011   Procedure: ESOPHAGOGASTRODUODENOSCOPY (EGD);  Surgeon: Rogene Houston, MD;  [facility]: [facility];  Service: Endoscopy;  Laterality: N/A;  300  . POLYPECTOMY  03/23/2016   Procedure: POLYPECTOMY;  Surgeon: Rogene Houston, MD;  Location: [facility];  Service: Endoscopy;;  Cecal polyp removed via cold forceps recto-sigmoid polyp removed via cold snare     Home Medications:  Allergies as of 10/28/2019   No Known Allergies     Medication List       Accurate as of October 28, 2019 11:59 PM. If you have any questions, ask your nurse or doctor.        acetaminophen 500 MG tablet Commonly known as: TYLENOL Take 500 mg by mouth every 6 (six) hours as needed.   ALIGN PREBIOTIC-PROBIOTIC PO Take by mouth.   budesonide 3 MG 24 hr capsule Commonly known as: ENTOCORT EC Take 1 capsule (3 mg total) by mouth daily.   carvedilol 6.25 MG tablet Commonly known as: COREG TAKE ONE TABLET BY MOUTH TWICE DAILY.   cephALEXin 500 MG capsule Commonly known as: Keflex Take 1 capsule (500 mg total) by mouth 2 (two) times daily.   diltiazem 180 MG 24 hr capsule Commonly known as: CARDIZEM CD Take 180 mg by mouth 2 (two) times daily.   famotidine 20 MG tablet Commonly known as: PEPCID Take 40 mg by mouth at bedtime.   famotidine 40 MG tablet Commonly known as: PEPCID Take 1 tablet (40 mg total) by mouth 2 (two) times daily.   finasteride 5 MG tablet Commonly known as: PROSCAR Take 5 mg by mouth daily.   GAS-X PO Take by mouth. Patient states that he takes after each meal and at bedtime.   levothyroxine 75 MCG tablet Commonly known as: SYNTHROID Take 75 mcg by mouth daily before breakfast.  magnesium gluconate 500 MG tablet Commonly known as: MAGONATE Take 500 mg by mouth 2 (two) times daily.   Melatonin 10 MG Tabs Take 10 mg by mouth at bedtime.   nitroGLYCERIN 0.4 MG SL tablet Commonly known as: NITROSTAT Place 0.4 mg under the tongue every 5 (five) minutes as needed. For chest pains. May repeat for up to 3 doses.   simvastatin 10 MG tablet Commonly known as: ZOCOR Take 10 mg by mouth at bedtime.   tamsulosin 0.4 MG Caps capsule Commonly known as: FLOMAX Take 0.4 mg by mouth 2 (two) times daily.   warfarin 2.5 MG tablet Commonly known as: COUMADIN Take as directed by the anticoagulation clinic. If you are unsure how to  take this medication, talk to your nurse or doctor. Original instructions: Take 2.5 mg by mouth as directed. As directed by coumadin clinic       Allergies: No Known Allergies  Family History  Problem Relation Age of Onset  . Colon cancer Mother   . Heart disease Father   . Stroke Brother   . Healthy Daughter     Social History:  reports that he has never smoked. He has never used smokeless tobacco. He reports that he does not drink alcohol or use drugs.  ROS: A complete review of systems was performed.  All systems are negative except for pertinent findings as noted.  Physical Exam:  Vital signs in last 24 hours: There were no vitals taken for this visit. Constitutional:  Alert and oriented, No acute distress Cardiovascular: Regular rate  Respiratory: Normal respiratory effort GI: Abdomen is soft, nontender, nondistended, no abdominal masses. No CVAT.  Genitourinary: Normal male phallus, testes are descended bilaterally and non-tender and without masses, scrotum is normal in appearance without lesions or masses, perineum is normal on inspection. Lymphatic: No lymphadenopathy Neurologic: Grossly intact, no focal deficits Psychiatric: Normal mood and affect  Laboratory Data:  No results for input(s): WBC, HGB, HCT, PLT in the last 72 hours.  No results for input(s): NA, K, CL, GLUCOSE, BUN, CALCIUM, CREATININE in the last 72 hours.  Invalid input(s): CO3   No results found for this or any previous visit (from the past 24 hour(s)). Recent Results (from the past 240 hour(s))  Culture, Urine     Status: None   Collection Time: 10/22/19 12:36 PM   Specimen: Urine, Clean Catch  Result Value Ref Range Status   Specimen Description   Final    URINE, CLEAN CATCH Performed at [facility], [address] [place], [place] [address]    Special Requests   Final    NONE Performed at [facility], [address], [place], [place] [address]    Culture   Final    NO GROWTH  Performed at [facility], [place] [address] [place], [place] [address]    Report Status 10/23/2019 FINAL  Final    Renal Function: No results for input(s): CREATININE in the last 168 hours. CrCl cannot be calculated (Patient's most recent lab result is older than the maximum 21 days allowed.).  Radiologic Imaging: No results found.   Cysto: Penis sterilely prepped/2% lidocaine jelly placed. Flexible scope passed. Prostate obstructive w/ bilobar hypertrophy. No intravesical protrusion seen. No median lobe. Prostatic urethra ~ 4.5 cm in length. Severe trabeculations seen, no other urothelial abnormalities.  Impression/Assessment:  BPH--with retention. Pt has failed TOV x 2  Plan:  1. Catheter placed again  2. Pamphlets on TURP, urolift given  3. Risks/benefits  of both of the above discussed  3. Will send for prostate U/S--will call w/ results and suggest therapy depending on results

## 2019-11-03 ENCOUNTER — Other Ambulatory Visit: Payer: Self-pay

## 2019-11-03 ENCOUNTER — Ambulatory Visit (INDEPENDENT_AMBULATORY_CARE_PROVIDER_SITE_OTHER): Payer: PPO | Admitting: *Deleted

## 2019-11-03 ENCOUNTER — Encounter (INDEPENDENT_AMBULATORY_CARE_PROVIDER_SITE_OTHER): Payer: Self-pay

## 2019-11-03 DIAGNOSIS — Z5181 Encounter for therapeutic drug level monitoring: Secondary | ICD-10-CM | POA: Diagnosis not present

## 2019-11-03 DIAGNOSIS — I4891 Unspecified atrial fibrillation: Secondary | ICD-10-CM

## 2019-11-03 LAB — POCT INR: INR: 1.3 — AB (ref 2.0–3.0)

## 2019-11-03 NOTE — Patient Instructions (Signed)
Take warfarin 2 tablets today and tomorrow then resume 1 tablet daily.  Recheck 11/10/19 Has indwelling foley catheter at present time.  No UTI Pt not interested in changing to Eliquis at this time

## 2019-11-06 ENCOUNTER — Telehealth: Payer: Self-pay | Admitting: Urology

## 2019-11-06 DIAGNOSIS — I10 Essential (primary) hypertension: Secondary | ICD-10-CM | POA: Diagnosis not present

## 2019-11-06 DIAGNOSIS — E039 Hypothyroidism, unspecified: Secondary | ICD-10-CM | POA: Diagnosis not present

## 2019-11-06 DIAGNOSIS — E7800 Pure hypercholesterolemia, unspecified: Secondary | ICD-10-CM | POA: Diagnosis not present

## 2019-11-06 DIAGNOSIS — N183 Chronic kidney disease, stage 3 unspecified: Secondary | ICD-10-CM | POA: Diagnosis not present

## 2019-11-06 DIAGNOSIS — R5383 Other fatigue: Secondary | ICD-10-CM | POA: Diagnosis not present

## 2019-11-06 DIAGNOSIS — K2100 Gastro-esophageal reflux disease with esophagitis, without bleeding: Secondary | ICD-10-CM | POA: Diagnosis not present

## 2019-11-06 NOTE — Telephone Encounter (Signed)
Patient called and states he talked with Radilology and they told him there is a question regarding his Ultrasound and they need clarification before they can schedule it.

## 2019-11-06 NOTE — Telephone Encounter (Signed)
Called. Left message to return call

## 2019-11-06 NOTE — Telephone Encounter (Signed)
Patient called the office returning you rcall.

## 2019-11-06 NOTE — Telephone Encounter (Signed)
*  your call

## 2019-11-07 NOTE — Telephone Encounter (Signed)
Pt states he has appt on Wednesday 27th at 1:30pm

## 2019-11-07 NOTE — Telephone Encounter (Signed)
Pt called to return your call

## 2019-11-10 ENCOUNTER — Other Ambulatory Visit: Payer: Self-pay

## 2019-11-10 ENCOUNTER — Ambulatory Visit (INDEPENDENT_AMBULATORY_CARE_PROVIDER_SITE_OTHER): Payer: PPO | Admitting: *Deleted

## 2019-11-10 DIAGNOSIS — Z5181 Encounter for therapeutic drug level monitoring: Secondary | ICD-10-CM

## 2019-11-10 DIAGNOSIS — I4891 Unspecified atrial fibrillation: Secondary | ICD-10-CM | POA: Diagnosis not present

## 2019-11-10 LAB — POCT INR: INR: 1.7 — AB (ref 2.0–3.0)

## 2019-11-10 NOTE — Patient Instructions (Signed)
Take warfarin 1 extra tablet today then increase dose to 1 tablet daily except 1/2 tablet on Mondays and Thursdays Recheck 11/17/19 Has indwelling foley catheter at present time.  No UTI Pt not interested in changing to Eliquis at this time

## 2019-11-11 ENCOUNTER — Encounter (INDEPENDENT_AMBULATORY_CARE_PROVIDER_SITE_OTHER): Payer: Self-pay | Admitting: Internal Medicine

## 2019-11-11 ENCOUNTER — Ambulatory Visit (INDEPENDENT_AMBULATORY_CARE_PROVIDER_SITE_OTHER): Payer: PPO | Admitting: Internal Medicine

## 2019-11-11 ENCOUNTER — Ambulatory Visit: Payer: PPO | Admitting: Urology

## 2019-11-11 ENCOUNTER — Encounter (INDEPENDENT_AMBULATORY_CARE_PROVIDER_SITE_OTHER): Payer: Self-pay | Admitting: *Deleted

## 2019-11-11 VITALS — BP 135/82 | HR 76 | Temp 97.4°F | Ht 68.0 in | Wt 141.6 lb

## 2019-11-11 DIAGNOSIS — K5000 Crohn's disease of small intestine without complications: Secondary | ICD-10-CM | POA: Diagnosis not present

## 2019-11-11 DIAGNOSIS — K5900 Constipation, unspecified: Secondary | ICD-10-CM | POA: Diagnosis not present

## 2019-11-11 MED ORDER — DOCUSATE SODIUM 100 MG PO CAPS: 100.0000 mg | ORAL_CAPSULE | Freq: Every day | ORAL | 0 refills | Status: DC

## 2019-11-11 MED ORDER — BUDESONIDE 3 MG PO CPEP: 6.0000 mg | ORAL_CAPSULE | Freq: Every day | ORAL | 0 refills | Status: DC

## 2019-11-11 NOTE — Progress Notes (Addendum)
Presenting complaint;  Follow-up for small bowel Crohn's disease.  Database and subjective:  Patient is 86 year old Caucasian [name] who was diagnosed with small bowel Crohn's disease when he was hospitalized at [facility] with small bowel obstruction and had bowel resection on 08/11/2019.  Office evaluation was delayed because he developed C. difficile.  He was initially seen on 09/18/2019 and his symptoms suggested small bowel bacterial overgrowth.  He was treated with with metronidazole.  Dr. Bronson Ing help monitor his INR because of interaction with metronidazole.  Patient reported resolution of his intractable flatulence.  He returned for MR enterography on 09/29/2019 which was concerning for small bowel stricture with transition in right lower quadrant felt to be the site of ileocolonic anastomosis.  Was no enhancement of wall.  He returned for small bowel follow-through on 10/06/2019 which revealed significant wall thickening to distal small bowel extending to the anastomosis.  There was mild dilation of small bowel proximal to this segment. Patient was begun on Pentasa.  He was finally able to get it approved.  He says it is costing $120 per month.  He is presently taking 3 mg of budesonide daily.  He has multiple capsules left.  He feels much better.  His wife states he has very good appetite.  He is now complaining of constipation.  He states his bowels move daily but he has to strain.  He denies abdominal distention or cramping.  He also denies melena or rectal bleeding.  He is not having any side effects with Pentasa.  He takes simethicone usually at bedtime.  Patient was seen by his urologist Dr. Diona Fanti and [facility] catheter was placed.  He is to follow-up with him in few days.  He says he is able to sleep at night.  Before Foley's catheter was placed he was getting up every hour to go to the bathroom.  Current Medications: Outpatient Encounter Medications as of 11/11/2019  Medication  Sig  . acetaminophen (TYLENOL) 500 MG tablet Take 500 mg by mouth every 6 (six) hours as needed.  . Bacillus Coagulans-Inulin (ALIGN PREBIOTIC-PROBIOTIC PO) Take by mouth daily.   . budesonide (ENTOCORT EC) 3 MG 24 hr capsule Take 1 capsule (3 mg total) by mouth daily.  . carvedilol (COREG) 6.25 MG tablet TAKE ONE TABLET BY MOUTH TWICE DAILY.  Marland Kitchen diltiazem (CARDIZEM CD) 180 MG 24 hr capsule Take 180 mg by mouth 2 (two) times daily.   . famotidine (PEPCID) Anthony MG tablet Take 1 tablet (Anthony mg total) by mouth 2 (two) times daily. (Patient taking differently: Take Anthony mg by mouth 4 (four) times daily. )  . finasteride (PROSCAR) 5 MG tablet Take 5 mg by mouth daily.  Marland Kitchen levothyroxine (SYNTHROID, LEVOTHROID) 75 MCG tablet Take 75 mcg by mouth daily before breakfast.  . magnesium gluconate (MAGONATE) 500 MG tablet Take 500 mg by mouth 2 (two) times daily.   . Melatonin 10 MG TABS Take 10 mg by mouth at bedtime.   . mesalamine (PENTASA) 250 MG CR capsule Take 500 mg by mouth 4 (four) times daily.  . nitroGLYCERIN (NITROSTAT) 0.4 MG SL tablet Place 0.4 mg under the tongue every 5 (five) minutes as needed. For chest pains. May repeat for up to 3 doses.  . Simethicone (GAS-X PO) Take by mouth. Patient states that he takes after each meal and at bedtime.  . simvastatin (ZOCOR) 10 MG tablet Take 10 mg by mouth at bedtime.   . Tamsulosin HCl (FLOMAX) 0.4 MG CAPS Take 0.4  mg by mouth 2 (two) times daily.   Marland Kitchen warfarin (COUMADIN) 2.5 MG tablet Take 2.5 mg by mouth as directed. As directed by coumadin clinic    No facility-administered encounter medications on file as of 11/11/2019.     Objective: Blood pressure 135/82, pulse 76, temperature (!) 97.4 F (36.3 C), temperature source Temporal, height 5' 8"  (1.727 m), weight 141 lb 9.6 oz (64.2 kg). Patient is alert and in no acute distress. He is wearing a facial mask. He is using walker to ambulate. Conjunctiva is pink. Sclera is nonicteric Oropharyngeal mucosa  is normal. No neck masses or thyromegaly noted. Cardiac exam with regular rhythm normal S1 and S2. No murmur or gallop noted. Lungs are clear to auscultation. Abdomen;  No LE edema or clubbing noted.  Labs/studies Results:  MR enterography was done on 09/29/2019. Summary of findings as follows. 1. No significant change in the appearance of the dilated small bowel loops within the right compared to the examination of 08/23/2019. Imaging findings are concerning for high-grade partial small bowel obstruction. Transition point appears to correspond to right lower quadrant enteroenteric anastomosis. Findings may reflect postsurgical adhesive disease, postinflammatory stricture or anastomotic scarring/fibrosis. 2. No evidence for active inflammatory bowel disease at this time. 3. Extensive bladder wall trabeculation with multiple diverticula formation compatible with chronic bladder outlet obstruction. 4. Small hiatal hernia.  Small bowel follow-through was done on 10/06/2019 Summary as follows.  Prior ileocolic resection with anastomosis in RIGHT mid abdomen. Significant bowel wall thickening of a long segment of the distal ileum extending through the anastomosis into the distal ascending colon consistent with Crohn's disease.  Component of partial obstruction is identified with bowel dilatation of the ileum proximal to the long segment of wall thickening.   Assessment:  #1.  Small bowel Crohn's disease.  2 imaging revealed significant bowel wall thickening involving distal small bowel proximal to anastomosis.  He is presently asymptomatic.  He appears to be doing well with budesonide and low-dose Pentasa.  He could increase budesonide to 6 mg daily.  #2.  Constipation.  It may be related to one of his medications.   Plan:  Increase budesonide to 6 mg by mouth daily until 14 doses left at which time we will decrease the dose to 3 mg and stop when prescription  finished. Continue Pentasa at 500 milligrams p.o. 4 times daily. Colace 100 mg daily at bedtime. If Colace does not help he can try low-dose polyethylene glycol.  He can take 1/4-1/2 of a scoop daily or every other day. Patient advised not to take OTC laxatives. [facility] copy of recent blood work from Dr. Edythe Lynn office. Office visit in 3 months.  Addendum. Lab data from 11/06/2019 reviewed with patient's wife. WBC 8.6 H&H 12.7 and 39.7 with platelet count of 190K. Comprehensive chemistry panel is normal with random glucose of 106.

## 2019-11-11 NOTE — Patient Instructions (Signed)
Take budesonide 6 mg daily until you have 15 capsules left and thereafter 3 mg 1 capsule daily until prescription runs out. Can increase Colace/stool softener to 200 mg daily at bedtime if 100 mg does not work. Will request copy of blood work from Dr. Edythe Lynn office for review.

## 2019-11-12 ENCOUNTER — Other Ambulatory Visit: Payer: Self-pay

## 2019-11-12 ENCOUNTER — Ambulatory Visit (HOSPITAL_COMMUNITY)
Admission: RE | Admit: 2019-11-12 | Discharge: 2019-11-12 | Disposition: A | Discharge status: Home / Self Care | Payer: PPO | Source: Ambulatory Visit | Attending: Urology | Admitting: Urology

## 2019-11-12 DIAGNOSIS — R339 Retention of urine, unspecified: Secondary | ICD-10-CM | POA: Diagnosis not present

## 2019-11-13 DIAGNOSIS — E7800 Pure hypercholesterolemia, unspecified: Secondary | ICD-10-CM | POA: Diagnosis not present

## 2019-11-13 DIAGNOSIS — Z6821 Body mass index (BMI) 21.0-21.9, adult: Secondary | ICD-10-CM | POA: Diagnosis not present

## 2019-11-13 DIAGNOSIS — K56600 Partial intestinal obstruction, unspecified as to cause: Secondary | ICD-10-CM | POA: Diagnosis not present

## 2019-11-13 DIAGNOSIS — N1330 Unspecified hydronephrosis: Secondary | ICD-10-CM | POA: Diagnosis not present

## 2019-11-13 DIAGNOSIS — R7301 Impaired fasting glucose: Secondary | ICD-10-CM | POA: Diagnosis not present

## 2019-11-13 DIAGNOSIS — I10 Essential (primary) hypertension: Secondary | ICD-10-CM | POA: Diagnosis not present

## 2019-11-14 DIAGNOSIS — I10 Essential (primary) hypertension: Secondary | ICD-10-CM | POA: Diagnosis not present

## 2019-11-14 DIAGNOSIS — K2100 Gastro-esophageal reflux disease with esophagitis, without bleeding: Secondary | ICD-10-CM | POA: Diagnosis not present

## 2019-11-17 ENCOUNTER — Other Ambulatory Visit: Payer: Self-pay

## 2019-11-17 ENCOUNTER — Telehealth: Payer: Self-pay | Admitting: Urology

## 2019-11-17 ENCOUNTER — Ambulatory Visit (INDEPENDENT_AMBULATORY_CARE_PROVIDER_SITE_OTHER): Payer: PPO | Admitting: *Deleted

## 2019-11-17 DIAGNOSIS — Z5181 Encounter for therapeutic drug level monitoring: Secondary | ICD-10-CM | POA: Diagnosis not present

## 2019-11-17 DIAGNOSIS — I4891 Unspecified atrial fibrillation: Secondary | ICD-10-CM | POA: Diagnosis not present

## 2019-11-17 LAB — POCT INR: INR: 1.7 — AB (ref 2.0–3.0)

## 2019-11-17 NOTE — Telephone Encounter (Signed)
Patient called back today and states he calle dlast week and did not get a return call from the nurse. He would like you to call him, he has a question about a procedure.

## 2019-11-17 NOTE — Patient Instructions (Signed)
Take warfarin 1/2 extra tablet today then increase dose to 1 1/2 tablets daily except 1 tablet on Sundays, Tuesdays and Thursdays Recheck 11/25/19 Has indwelling foley catheter at present time.  No UTI Pt not interested in changing to Eliquis at this time

## 2019-11-18 NOTE — Telephone Encounter (Signed)
Pt called for his u/s report

## 2019-11-20 ENCOUNTER — Telehealth: Payer: Self-pay | Admitting: Urology

## 2019-11-20 DIAGNOSIS — Z9049 Acquired absence of other specified parts of digestive tract: Secondary | ICD-10-CM | POA: Diagnosis not present

## 2019-11-20 DIAGNOSIS — K5090 Crohn's disease, unspecified, without complications: Secondary | ICD-10-CM | POA: Diagnosis not present

## 2019-11-20 DIAGNOSIS — Z7901 Long term (current) use of anticoagulants: Secondary | ICD-10-CM | POA: Diagnosis not present

## 2019-11-20 DIAGNOSIS — Z48815 Encounter for surgical aftercare following surgery on the digestive system: Secondary | ICD-10-CM | POA: Diagnosis not present

## 2019-11-20 DIAGNOSIS — N181 Chronic kidney disease, stage 1: Secondary | ICD-10-CM | POA: Diagnosis not present

## 2019-11-20 DIAGNOSIS — K219 Gastro-esophageal reflux disease without esophagitis: Secondary | ICD-10-CM | POA: Diagnosis not present

## 2019-11-20 DIAGNOSIS — E039 Hypothyroidism, unspecified: Secondary | ICD-10-CM | POA: Diagnosis not present

## 2019-11-20 DIAGNOSIS — I4891 Unspecified atrial fibrillation: Secondary | ICD-10-CM | POA: Diagnosis not present

## 2019-11-20 DIAGNOSIS — N400 Enlarged prostate without lower urinary tract symptoms: Secondary | ICD-10-CM | POA: Diagnosis not present

## 2019-11-20 NOTE — Telephone Encounter (Signed)
Patient called again today for ultrasound results.

## 2019-11-21 NOTE — Telephone Encounter (Signed)
Pt. States that he wish to proceed with the Urolift. The pt. states you told him that you would do this in the [place] office.

## 2019-11-21 NOTE — Telephone Encounter (Signed)
Please call patient.  His prostate size was 55 mL which would make him a candidate either for a UroLift or/and a TURP.  It is his choice which way he wants to go.  If he wants UroLift, we can do that down here in [place].  TURP would need to be done down here as well.

## 2019-11-21 NOTE — Telephone Encounter (Signed)
Pt calling for u/s results

## 2019-11-24 ENCOUNTER — Other Ambulatory Visit: Payer: Self-pay | Admitting: Urology

## 2019-11-24 ENCOUNTER — Ambulatory Visit (INDEPENDENT_AMBULATORY_CARE_PROVIDER_SITE_OTHER): Payer: PPO | Admitting: *Deleted

## 2019-11-24 ENCOUNTER — Other Ambulatory Visit: Payer: Self-pay

## 2019-11-24 DIAGNOSIS — Z5181 Encounter for therapeutic drug level monitoring: Secondary | ICD-10-CM | POA: Diagnosis not present

## 2019-11-24 DIAGNOSIS — N401 Enlarged prostate with lower urinary tract symptoms: Secondary | ICD-10-CM

## 2019-11-24 DIAGNOSIS — I4891 Unspecified atrial fibrillation: Secondary | ICD-10-CM | POA: Diagnosis not present

## 2019-11-24 DIAGNOSIS — R3914 Feeling of incomplete bladder emptying: Secondary | ICD-10-CM

## 2019-11-24 LAB — POCT INR: INR: 2.2 (ref 2.0–3.0)

## 2019-11-24 MED ORDER — LEVOFLOXACIN 750 MG PO TABS: 750.0000 mg | ORAL_TABLET | Freq: Every day | ORAL | 0 refills | Status: AC

## 2019-11-24 NOTE — Telephone Encounter (Signed)
Please let patient know that we will call to set up his appointment/ procedure.  Let him know that I also sent in Levaquin for him to take the morning of the procedure.

## 2019-11-24 NOTE — Patient Instructions (Signed)
Continue warfarin 1 1/2 tablets daily except 1 tablet on Sundays, Tuesdays and Thursdays Recheck 11/25/19 Has indwelling foley catheter at present time.  No UTI .   Pending bladder procedure.  Not scheduled yet. Pt not interested in changing to Eliquis at this time

## 2019-11-25 ENCOUNTER — Other Ambulatory Visit (INDEPENDENT_AMBULATORY_CARE_PROVIDER_SITE_OTHER): Payer: Self-pay | Admitting: Internal Medicine

## 2019-11-25 NOTE — Telephone Encounter (Signed)
Pt asking if he should stop his coumadin

## 2019-11-25 NOTE — Telephone Encounter (Signed)
Pt notified of antibiotic sent in.

## 2019-11-25 NOTE — Telephone Encounter (Signed)
We will call him to let him know when--but yes

## 2019-11-27 ENCOUNTER — Telehealth: Payer: Self-pay | Admitting: Urology

## 2019-11-27 NOTE — Telephone Encounter (Signed)
Patient returned your call this afternoon.

## 2019-11-27 NOTE — Telephone Encounter (Signed)
Patient called this morning and states he hasn't heard anything as far as scheduling his procedure as you guys had discussed. He states he called [place] office and they don't know anything about it. He requests a return call.

## 2019-11-27 NOTE — Telephone Encounter (Signed)
Called and unable to reach pt. No vm to leave message

## 2019-11-28 ENCOUNTER — Telehealth: Payer: Self-pay | Admitting: Urology

## 2019-11-28 ENCOUNTER — Other Ambulatory Visit: Payer: Self-pay | Admitting: Urology

## 2019-11-28 ENCOUNTER — Ambulatory Visit: Payer: PPO

## 2019-11-28 DIAGNOSIS — N138 Other obstructive and reflux uropathy: Secondary | ICD-10-CM

## 2019-11-28 MED ORDER — CEPHALEXIN 500 MG PO CAPS: 500.0000 mg | ORAL_CAPSULE | Freq: Two times a day (BID) | ORAL | 0 refills | Status: DC

## 2019-11-28 NOTE — Telephone Encounter (Signed)
Patient called this morning stating he has questions about antibiotics  He has levofloxacin  But now cephalexin was called in so he is confused.  Also, has questions about his catheter, requesting call back today from [facility] if possible.  Thanks

## 2019-11-28 NOTE — Telephone Encounter (Signed)
Pt notified of antibiotic to start and will come Tuesday for cath change

## 2019-11-28 NOTE — Telephone Encounter (Signed)
-----   Message from Franchot Gallo, MD sent at 11/28/2019  6:56 AM EST ----- Pt needs cath change soon--can you schedule this? Let him know I also sent in a scrip for abx to start 5 days before procedure (can't remember if I already did this or not)

## 2019-12-02 ENCOUNTER — Ambulatory Visit (INDEPENDENT_AMBULATORY_CARE_PROVIDER_SITE_OTHER): Payer: PPO

## 2019-12-02 ENCOUNTER — Other Ambulatory Visit: Payer: Self-pay

## 2019-12-02 DIAGNOSIS — R339 Retention of urine, unspecified: Secondary | ICD-10-CM

## 2019-12-02 DIAGNOSIS — N401 Enlarged prostate with lower urinary tract symptoms: Secondary | ICD-10-CM

## 2019-12-02 NOTE — Progress Notes (Signed)
Cath Change/ Replacement  Patient is present today for a catheter change due to urinary retention.  3m of water was removed from the balloon, a 16FR foley cath was removed with out difficulty.  Patient was cleaned and prepped in a sterile fashion with betadine. A 16 FR foley cath was replaced into the bladder no complications were noted Urine return was noted 10 ml and urine was yellow in color. The balloon was filled with 119mof sterile water. A leg bag was attached for drainage.  A night bag was also given to the patient and patient was given instruction on how to change from one bag to another. Patient was given proper instruction on catheter care.    Performed by: AmGibson RampRN  Follow up: post op appt was made prior to leaving.

## 2019-12-08 ENCOUNTER — Other Ambulatory Visit: Payer: Self-pay

## 2019-12-08 ENCOUNTER — Ambulatory Visit (INDEPENDENT_AMBULATORY_CARE_PROVIDER_SITE_OTHER): Payer: PPO | Admitting: *Deleted

## 2019-12-08 DIAGNOSIS — I4891 Unspecified atrial fibrillation: Secondary | ICD-10-CM

## 2019-12-08 DIAGNOSIS — Z5181 Encounter for therapeutic drug level monitoring: Secondary | ICD-10-CM

## 2019-12-08 LAB — POCT INR: INR: 2.1 (ref 2.0–3.0)

## 2019-12-08 NOTE — Patient Instructions (Signed)
Continue warfarin 1 1/2 tablets daily except 1 tablet on Sundays, Tuesdays and Thursdays Recheck 12/29/2019 Has indwelling foley catheter at present time.  No UTI .   Pending bladder procedure 01/05/20. Pt not interested in changing to Eliquis at this time

## 2019-12-12 DIAGNOSIS — E7849 Other hyperlipidemia: Secondary | ICD-10-CM | POA: Diagnosis not present

## 2019-12-12 DIAGNOSIS — I10 Essential (primary) hypertension: Secondary | ICD-10-CM | POA: Diagnosis not present

## 2019-12-16 ENCOUNTER — Other Ambulatory Visit: Payer: Self-pay

## 2019-12-16 ENCOUNTER — Encounter: Payer: Self-pay | Admitting: Cardiovascular Disease

## 2019-12-16 ENCOUNTER — Ambulatory Visit (INDEPENDENT_AMBULATORY_CARE_PROVIDER_SITE_OTHER): Payer: PPO | Admitting: Cardiovascular Disease

## 2019-12-16 VITALS — BP 129/68 | HR 75 | Ht 68.0 in | Wt 145.8 lb

## 2019-12-16 DIAGNOSIS — Z7901 Long term (current) use of anticoagulants: Secondary | ICD-10-CM | POA: Diagnosis not present

## 2019-12-16 DIAGNOSIS — I4821 Permanent atrial fibrillation: Secondary | ICD-10-CM | POA: Diagnosis not present

## 2019-12-16 DIAGNOSIS — I25118 Atherosclerotic heart disease of native coronary artery with other forms of angina pectoris: Secondary | ICD-10-CM

## 2019-12-16 DIAGNOSIS — I10 Essential (primary) hypertension: Secondary | ICD-10-CM | POA: Diagnosis not present

## 2019-12-16 NOTE — Progress Notes (Signed)
SUBJECTIVE: [name] a 86 y.o.malewith coronary artery disease, permanent atrial fibrillation, and GERD.  Echocardiogram on 10/22/2018 showed normal left ventricular systolic function and regional wall motion, LVEF 55%. There was mild left atrial dilatation.  He underwent a low risk nuclear stress test in October 2018whichdemonstrated prior inferoseptal myocardial infarction without ischemia.  He is here with his wife as always.  They brought in a detailed summary of his medical history within the past several months.  He has a catheter due to urinary retention and plans to undergo a UroLift procedure on 01/05/2020.  He was diagnosed with small bowel Crohn's disease with small bowel obstruction when he was hospitalized at [facility] in October 2020.  He then developed C. difficile.  Bowel resection on 08/11/2019.  From a cardiac standpoint he is doing well and denies chest pain, palpitations, leg swelling, shortness of breath.  I reviewed labs dated 11/06/2019: Hemoglobin 12.7, platelets 190, BUN 12, creatinine 0.92, total cholesterol 130, triglycerides 106, HDL 60, LDL 51, TSH 1.76, magnesium 1.6.     Review of Systems: As per "subjective", otherwise negative.  No Known Allergies  Current Outpatient Medications  Medication Sig Dispense Refill  . acetaminophen (TYLENOL) 500 MG tablet Take 500 mg by mouth every 6 (six) hours as needed.    . Bacillus Coagulans-Inulin (ALIGN PREBIOTIC-PROBIOTIC PO) Take by mouth daily.     . carvedilol (COREG) 6.25 MG tablet TAKE ONE TABLET BY MOUTH TWICE DAILY. 60 tablet 6  . diltiazem (CARDIZEM CD) 180 MG 24 hr capsule Take 180 mg by mouth 2 (two) times daily.     Marland Kitchen docusate sodium (COLACE) 100 MG capsule Take 1 capsule (100 mg total) by mouth at bedtime. 10 capsule 0  . famotidine (PEPCID) 40 MG tablet TAKE ONE TABLET BY MOUTH TWICE DAILY. 60 tablet 2  . finasteride (PROSCAR) 5 MG tablet Take 5 mg by mouth daily.    Marland Kitchen  levothyroxine (SYNTHROID, LEVOTHROID) 75 MCG tablet Take 75 mcg by mouth daily before breakfast.    . magnesium gluconate (MAGONATE) 500 MG tablet Take 500 mg by mouth 2 (two) times daily.     . Melatonin 10 MG TABS Take 10 mg by mouth at bedtime.     . mesalamine (PENTASA) 250 MG CR capsule Take 500 mg by mouth 4 (four) times daily.    . nitroGLYCERIN (NITROSTAT) 0.4 MG SL tablet Place 0.4 mg under the tongue every 5 (five) minutes as needed. For chest pains. May repeat for up to 3 doses.    . Simethicone (GAS-X PO) Take by mouth. Patient states that he takes after each meal and at bedtime.    . simvastatin (ZOCOR) 10 MG tablet Take 10 mg by mouth at bedtime.     . Tamsulosin HCl (FLOMAX) 0.4 MG CAPS Take 0.4 mg by mouth 2 (two) times daily.     Marland Kitchen warfarin (COUMADIN) 2.5 MG tablet Take 2.5 mg by mouth as directed. As directed by coumadin clinic      No current facility-administered medications for this visit.    Past Medical History:  Diagnosis Date  . Atrial fibrillation ([place])    Permanent  . CAD (coronary artery disease)    Catheterization 2004, mild/moderate nonobstructive disease  /   nuclear, 2007, small inferior scar // no ischemia  . Carotid bruit    Doppler, December 23, 2010, no significant carotid disease  . Clot    LA, small, in past  . Dizziness  03/2010   June, 2011   Mild, stop digoxin, felt better  . Ejection fraction 02/2009   EF 55-60%,Echo, May, 2010  . Elevated PSA   . GERD (gastroesophageal reflux disease)   . LVH (left ventricular hypertrophy) 02/2009    Mild, echo, 2010  . Mitral regurgitation    Mild, echo, flat closure  . Nausea vomiting and diarrhea 01/2010   Hospitalization, probably viral (or partial small bowel obstruction)/ Hospital 09/2010, recurrent possible partial small bowel obstruction, medical therapy  . Other malaise and fatigue   . Warfarin anticoagulation    Atrial fibrillation    Past Surgical History:  Procedure Laterality Date  . AGILE  CAPSULE  10/18/2011   Procedure: AGILE CAPSULE;  Surgeon: Rogene Houston, MD;  [facility];  Service: Endoscopy;  Laterality: N/A;  730  . CARDIAC CATHETERIZATION  2004  . CHOLECYSTECTOMY  2010   Dr. Anthony Sar  . COLONOSCOPY  2008   [facility]  . COLONOSCOPY N/A 03/23/2016   Procedure: COLONOSCOPY;  Surgeon: Rogene Houston, MD;  Location: [facility];  Service: Endoscopy;  Laterality: N/A;  1:00  . ESOPHAGOGASTRODUODENOSCOPY  11/08/2011   Procedure: ESOPHAGOGASTRODUODENOSCOPY (EGD);  Surgeon: Rogene Houston, MD;  Location: [facility];  Service: Endoscopy;  Laterality: N/A;  300  . POLYPECTOMY  03/23/2016   Procedure: POLYPECTOMY;  Surgeon: Rogene Houston, MD;  Location[facility];  Service: Endoscopy;;  Cecal polyp removed via cold forceps recto-sigmoid polyp removed via cold snare    Social History   Socioeconomic History  . Marital status: Married    Spouse name: Not on file  . Number of children: Not on file  . Years of education: Not on file  . Highest education level: Not on file  Occupational History  . Occupation: Engineer, agricultural: RETIRED  Tobacco Use  . Smoking status: Never Smoker  . Smokeless tobacco: Never Used  Substance and Sexual Activity  . Alcohol use: No    Alcohol/week: 0.0 standard drinks  . Drug use: No  . Sexual activity: Not on file  Other Topics Concern  . Not on file  Social History Narrative   Married   Social Determinants of Health   Financial Resource Strain:   . Difficulty of Paying Living Expenses: Not on file  Food Insecurity:   . Worried About Charity fundraiser in the Last Year: Not on file  . Ran Out of Food in the Last Year: Not on file  Transportation Needs:   . Lack of Transportation (Medical): Not on file  . Lack of Transportation (Non-Medical): Not on file  Physical Activity:   . Days of Exercise per Week: Not on file  . Minutes of Exercise per Session: Not on file  Stress:   . Feeling of  Stress : Not on file  Social Connections:   . Frequency of Communication with Friends and Family: Not on file  . Frequency of Social Gatherings with Friends and Family: Not on file  . Attends Religious Services: Not on file  . Active Member of Clubs or Organizations: Not on file  . Attends Archivist Meetings: Not on file  . Marital Status: Not on file  Intimate Partner Violence:   . Fear of Current or Ex-Partner: Not on file  . Emotionally Abused: Not on file  . Physically Abused: Not on file  . Sexually Abused: Not on file    [facility] LPN was  present throughout the entirety of the encounter.  Vitals:   12/16/19 1336  BP: 129/68  Pulse: 75  SpO2: 98%  Weight: 145 lb 12.8 oz (66.1 kg)  Height: 5' 8"  (1.727 m)    Wt Readings from Last 3 Encounters:  12/16/19 145 lb 12.8 oz (66.1 kg)  11/11/19 141 lb 9.6 oz (64.2 kg)  10/21/19 135 lb (61.2 kg)     PHYSICAL EXAM General: NAD HEENT: Normal. Neck: No JVD, no thyromegaly. Lungs: Clear to auscultation bilaterally with normal respiratory effort. CV: Regular rate and  irregular rhythm, normal S1/S2, no S3, no murmur. No pretibial or periankle edema.  No carotid bruit.   Abdomen: Soft, nontender, no distention.  Neurologic: Alert and oriented.  Psych: Normal affect. Skin: Normal. Musculoskeletal: No gross deformities.      Labs: Lab Results  Component Value Date/Time   K 4.3 09/25/2019 03:03 PM   BUN 15 09/25/2019 03:03 PM   CREATININE 0.95 09/25/2019 03:03 PM   ALT 21 09/25/2019 03:03 PM   HGB 12.2 (L) 09/25/2019 03:03 PM     Lipids: No results found for: LDLCALC, LDLDIRECT, CHOL, TRIG, HDL     ASSESSMENT AND PLAN:  1.Permanentatrial fibrillation: Symptomatically stable on long-acting diltiazem 180 mg twice daily. Anticoagulated with warfarin. No changes.  2. Coronary artery disease:Symptomaticallystable. Low risk nuclear stress test in October 2018 asnoted above.Continuecarvedilol and  simvastatin.No aspirin as he is on warfarin.   3.  Hypertension: BP is normal.  No change to therapy.   Disposition: Follow up 6 months virtual visit   Kate Sable, M.D., F.A.C.C.

## 2019-12-16 NOTE — Patient Instructions (Addendum)
Medication Instructions:  Continue all current medications.   Labwork: none  Testing/Procedures: none  Follow-Up: 6 months   Any Other Special Instructions Will Be Listed Below (If Applicable).   If you need a refill on your cardiac medications before your next appointment, please call your pharmacy.  

## 2019-12-20 DIAGNOSIS — Z48815 Encounter for surgical aftercare following surgery on the digestive system: Secondary | ICD-10-CM | POA: Diagnosis not present

## 2019-12-29 ENCOUNTER — Other Ambulatory Visit: Payer: Self-pay

## 2019-12-29 ENCOUNTER — Ambulatory Visit (INDEPENDENT_AMBULATORY_CARE_PROVIDER_SITE_OTHER): Payer: PPO | Admitting: *Deleted

## 2019-12-29 DIAGNOSIS — Z5181 Encounter for therapeutic drug level monitoring: Secondary | ICD-10-CM

## 2019-12-29 DIAGNOSIS — I4891 Unspecified atrial fibrillation: Secondary | ICD-10-CM

## 2019-12-29 LAB — POCT INR: INR: 2 (ref 2.0–3.0)

## 2019-12-29 NOTE — Patient Instructions (Signed)
Continue warfarin 1 1/2 tablets daily except 1 tablet on Sundays, Tuesdays and Thursdays Scheduled for urological procedure on 3/22.  Will take last dose of warfarin 3/16 and resume night of procedure. Pt not interested in changing to Eliquis at this time

## 2020-01-05 ENCOUNTER — Other Ambulatory Visit: Payer: Self-pay | Admitting: Urology

## 2020-01-05 DIAGNOSIS — R3914 Feeling of incomplete bladder emptying: Secondary | ICD-10-CM | POA: Diagnosis not present

## 2020-01-05 DIAGNOSIS — N401 Enlarged prostate with lower urinary tract symptoms: Secondary | ICD-10-CM

## 2020-01-05 MED ORDER — DOXYCYCLINE HYCLATE 100 MG PO TABS: 100.0000 mg | ORAL_TABLET | Freq: Two times a day (BID) | ORAL | 0 refills | Status: DC

## 2020-01-06 ENCOUNTER — Telehealth: Payer: Self-pay | Admitting: Urology

## 2020-01-06 NOTE — Telephone Encounter (Signed)
Pt called and stated that Dr. Diona Fanti instructed him to speak to [facility] regarding removing his cath. He would like a phone call back.

## 2020-01-06 NOTE — Telephone Encounter (Signed)
Pt states he needs his cath out? Is there a time frame when? I don't see anything in epic.

## 2020-01-07 NOTE — Telephone Encounter (Signed)
See if you can have him come in for trial of voiding on Friday.  He

## 2020-01-07 NOTE — Telephone Encounter (Signed)
Scheduled for Friday.

## 2020-01-09 ENCOUNTER — Other Ambulatory Visit: Payer: Self-pay

## 2020-01-09 ENCOUNTER — Ambulatory Visit (INDEPENDENT_AMBULATORY_CARE_PROVIDER_SITE_OTHER): Payer: PPO | Admitting: Urology

## 2020-01-09 VITALS — Temp 96.3°F

## 2020-01-09 DIAGNOSIS — R339 Retention of urine, unspecified: Secondary | ICD-10-CM

## 2020-01-09 LAB — BLADDER SCAN AMB NON-IMAGING

## 2020-01-09 NOTE — Progress Notes (Signed)
Per Dr. Jeffie Pollock pt is to see extender next week in [facility] office with [facility] due clinic in [place] being closed for holiday. Pt will needing voiding trial.

## 2020-01-09 NOTE — Progress Notes (Signed)
Fill and Pull Catheter Removal  Patient is present today for a catheter removal.  Patient was cleaned and prepped in a sterile fashion 144m of sterile water/ saline was instilled into the bladder when the patient felt the urge to urinate. 156mof water was then drained from the balloon.  A 16FR foley cath was removed from the bladder no complications were noted .  Patient as then given some time to void on their own.  Patient voided 1071mn their own after some time.  Patient was unable to void more. Dr. WreJeffie Pollocks pt. Come back in afternoon for pvr. Performed by: d. Elaf Clauson, LPN     Follow up/ Additional notes: [name]

## 2020-01-09 NOTE — Progress Notes (Signed)
Simple Catheter Placement  Due to urinary retention patient is present today for a foley cath placement.  Patient was cleaned and prepped in a sterile fashion with betadine. A 16 FR foley catheter was inserted, urine return was noted  371m, urine was yellow in color.  The balloon was filled with 10cc of sterile water.  A leg bag was attached for drainage. Patient was also given a night bag to take home and was given instruction on how to change from one bag to another.  Patient was given instruction on proper catheter care.  Patient tolerated well, no complications were noted   Performed by: H.Burlene Montecalvo LPN  Additional notes/ Follow up: 01/20/20

## 2020-01-13 DIAGNOSIS — H6590 Unspecified nonsuppurative otitis media, unspecified ear: Secondary | ICD-10-CM | POA: Diagnosis not present

## 2020-01-13 DIAGNOSIS — R42 Dizziness and giddiness: Secondary | ICD-10-CM | POA: Diagnosis not present

## 2020-01-13 DIAGNOSIS — Z6822 Body mass index (BMI) 22.0-22.9, adult: Secondary | ICD-10-CM | POA: Diagnosis not present

## 2020-01-14 ENCOUNTER — Other Ambulatory Visit: Payer: Self-pay

## 2020-01-14 ENCOUNTER — Ambulatory Visit (INDEPENDENT_AMBULATORY_CARE_PROVIDER_SITE_OTHER): Payer: PPO | Admitting: *Deleted

## 2020-01-14 DIAGNOSIS — Z5181 Encounter for therapeutic drug level monitoring: Secondary | ICD-10-CM

## 2020-01-14 DIAGNOSIS — I4891 Unspecified atrial fibrillation: Secondary | ICD-10-CM

## 2020-01-14 LAB — POCT INR: INR: 1.5 — AB (ref 2.0–3.0)

## 2020-01-14 NOTE — Patient Instructions (Signed)
Take warfarin 2 tablets tonight and tomorrow night then resume 1 1/2 tablets daily except 1 tablet on Sundays, Tuesdays and Thursdays Had surgery on 3/22.  Did not restart warfarin until 01/08/20 Recheck in 1 week

## 2020-01-15 DIAGNOSIS — N401 Enlarged prostate with lower urinary tract symptoms: Secondary | ICD-10-CM | POA: Diagnosis not present

## 2020-01-15 DIAGNOSIS — R3914 Feeling of incomplete bladder emptying: Secondary | ICD-10-CM | POA: Diagnosis not present

## 2020-01-20 ENCOUNTER — Ambulatory Visit: Payer: PPO

## 2020-01-22 ENCOUNTER — Telehealth: Payer: Self-pay | Admitting: Urology

## 2020-01-22 ENCOUNTER — Other Ambulatory Visit: Payer: Self-pay

## 2020-01-22 ENCOUNTER — Ambulatory Visit (INDEPENDENT_AMBULATORY_CARE_PROVIDER_SITE_OTHER): Payer: PPO | Admitting: *Deleted

## 2020-01-22 DIAGNOSIS — Z5181 Encounter for therapeutic drug level monitoring: Secondary | ICD-10-CM

## 2020-01-22 DIAGNOSIS — I4891 Unspecified atrial fibrillation: Secondary | ICD-10-CM

## 2020-01-22 LAB — POCT INR: INR: 2.3 (ref 2.0–3.0)

## 2020-01-22 NOTE — Patient Instructions (Signed)
Continue warfarin 1 1/2 tablets daily except 1 tablet on Sundays, Tuesdays and Thursdays Recheck in 3 weeks

## 2020-01-22 NOTE — Telephone Encounter (Signed)
Pt reported he saw the PA last week and was instructed to stay on flomax until he saw. Dr. Diona Fanti in the office. Pt has appt this Tuesday and wanted to know if he should continue.. encouraged pt to f/u PA instructions from [facility] until he sees md. Pt voiced understanding.

## 2020-01-22 NOTE — Telephone Encounter (Signed)
Patient called this morning and said he was returning a call to our office. I don't see a message in his chart from anyone. But, he asked that a nurse call him back. He has a clinical question.

## 2020-01-26 ENCOUNTER — Other Ambulatory Visit: Payer: Self-pay | Admitting: Cardiovascular Disease

## 2020-01-27 ENCOUNTER — Other Ambulatory Visit: Payer: Self-pay

## 2020-01-27 ENCOUNTER — Ambulatory Visit: Payer: PPO

## 2020-01-27 VITALS — BP 114/50 | HR 83 | Temp 98.2°F | Ht 68.0 in | Wt 150.0 lb

## 2020-01-27 DIAGNOSIS — L570 Actinic keratosis: Secondary | ICD-10-CM | POA: Diagnosis not present

## 2020-01-27 DIAGNOSIS — R3914 Feeling of incomplete bladder emptying: Secondary | ICD-10-CM

## 2020-01-27 DIAGNOSIS — N401 Enlarged prostate with lower urinary tract symptoms: Secondary | ICD-10-CM

## 2020-01-27 LAB — BLADDER SCAN AMB NON-IMAGING: Scan Result: 149.5

## 2020-01-27 NOTE — Progress Notes (Addendum)
Pt. came in for NV but requested to see MD.  Urological Symptom Review  Patient is experiencing the following symptoms: Get up at night to urinate   Review of Systems  Gastrointestinal (upper)  : Negative for upper GI symptoms  Gastrointestinal (lower) : Negative for lower GI symptoms  Constitutional : Negative for symptoms  Skin: Negative for skin symptoms  Eyes: Negative for eye symptoms  Ear/Nose/Throat : Negative for Ear/Nose/Throat symptoms  Hematologic/Lymphatic: Negative for Hematologic/Lymphatic symptoms  Cardiovascular : Negative for cardiovascular symptoms  Respiratory : Negative for respiratory symptoms  Endocrine: Negative for endocrine symptoms  Musculoskeletal: Negative for musculoskeletal symptoms  Neurological: Negative for neurological symptoms  Psychologic: Negative for psychiatric symptoms   I spoke with the patient for approximately 11 minutes, face-to-face in consultation.  All questions were answered.  He will follow-up as scheduled.

## 2020-02-10 DIAGNOSIS — R42 Dizziness and giddiness: Secondary | ICD-10-CM | POA: Diagnosis not present

## 2020-02-10 DIAGNOSIS — Z6823 Body mass index (BMI) 23.0-23.9, adult: Secondary | ICD-10-CM | POA: Diagnosis not present

## 2020-02-10 DIAGNOSIS — R2689 Other abnormalities of gait and mobility: Secondary | ICD-10-CM | POA: Diagnosis not present

## 2020-02-12 ENCOUNTER — Ambulatory Visit (INDEPENDENT_AMBULATORY_CARE_PROVIDER_SITE_OTHER): Payer: PPO

## 2020-02-12 ENCOUNTER — Other Ambulatory Visit: Payer: Self-pay

## 2020-02-12 DIAGNOSIS — I4891 Unspecified atrial fibrillation: Secondary | ICD-10-CM

## 2020-02-12 DIAGNOSIS — H04123 Dry eye syndrome of bilateral lacrimal glands: Secondary | ICD-10-CM | POA: Diagnosis not present

## 2020-02-12 DIAGNOSIS — Z5181 Encounter for therapeutic drug level monitoring: Secondary | ICD-10-CM

## 2020-02-12 LAB — POCT INR: INR: 1.7 — AB (ref 2.0–3.0)

## 2020-02-12 NOTE — Patient Instructions (Signed)
Description   Take 1.5 tablets today, then start taking 1.5  tablets daily except 1 tablet on Sundays and Thursdays Recheck in 2 weeks

## 2020-02-13 DIAGNOSIS — E039 Hypothyroidism, unspecified: Secondary | ICD-10-CM | POA: Diagnosis not present

## 2020-02-13 DIAGNOSIS — I10 Essential (primary) hypertension: Secondary | ICD-10-CM | POA: Diagnosis not present

## 2020-02-13 DIAGNOSIS — E7801 Familial hypercholesterolemia: Secondary | ICD-10-CM | POA: Diagnosis not present

## 2020-02-17 ENCOUNTER — Other Ambulatory Visit: Payer: Self-pay

## 2020-02-17 ENCOUNTER — Ambulatory Visit (INDEPENDENT_AMBULATORY_CARE_PROVIDER_SITE_OTHER): Payer: PPO | Admitting: Urology

## 2020-02-17 ENCOUNTER — Other Ambulatory Visit (HOSPITAL_COMMUNITY)
Admission: AD | Admit: 2020-02-17 | Discharge: 2020-02-17 | Disposition: A | Discharge status: Home / Self Care | Payer: PPO | Source: Skilled Nursing Facility | Attending: Urology | Admitting: Urology

## 2020-02-17 VITALS — BP 138/75 | HR 71 | Temp 97.7°F | Ht 68.0 in | Wt 151.0 lb

## 2020-02-17 DIAGNOSIS — R339 Retention of urine, unspecified: Secondary | ICD-10-CM | POA: Diagnosis not present

## 2020-02-17 DIAGNOSIS — I5032 Chronic diastolic (congestive) heart failure: Secondary | ICD-10-CM | POA: Diagnosis not present

## 2020-02-17 DIAGNOSIS — N3000 Acute cystitis without hematuria: Secondary | ICD-10-CM | POA: Insufficient documentation

## 2020-02-17 LAB — POCT URINALYSIS DIPSTICK
Bilirubin, UA: NEGATIVE
Glucose, UA: NEGATIVE
Ketones, UA: NEGATIVE
Nitrite, UA: NEGATIVE
Protein, UA: POSITIVE — AB
Spec Grav, UA: 1.015 (ref 1.010–1.025)
Urobilinogen, UA: 0.2 E.U./dL
pH, UA: 5 (ref 5.0–8.0)

## 2020-02-17 LAB — BLADDER SCAN AMB NON-IMAGING: Scan Result: 205

## 2020-02-17 MED ORDER — CEPHALEXIN 500 MG PO CAPS: 500.0000 mg | ORAL_CAPSULE | Freq: Two times a day (BID) | ORAL | 0 refills | Status: DC

## 2020-02-17 NOTE — Progress Notes (Signed)
See progress notes

## 2020-02-17 NOTE — Progress Notes (Signed)
H&P  Chief Complaint: BPH w/ LUTS  History of Present Illness:   5.4.2021: Here today for f/u 6 wks post urolift having had his catheter removed when last seen for NV. He reports that he was doing quite well with regards to his urination up until a few days ago and is now having very frequent urination (q 45 min - 1 hr frequency). He denies any cloudy urine or dysuria but has recently noted newly foul-smelling urine. He does note that he is unable to retract his foreskin.   (below copied from [facility] records):  Simple Kidney Cyst:  Esten Dollar is a 86 year-old male established patient who is here for a simple renal cyst.  The problem is on the left side.   7.14.20: Hospitalized on 5.28.20 for bowel obstruction -- CT showed incidental finding of spot on left kidney that was followed-up with U/S and diagnosed as a simple cyst. He has been on Flomax for 10 years and is fairly satisfied with his urination but has to still get up 3x during the night to urinate. He denies any recent UTI or gross hematuria. Scans revealed that his left kidney is obstructed with hydronephrosis.   9.22.2020: He denies any changes in urinary sx's/pattern or any new pain.   Hydronephrosis:  The problem is on the left side. He had the following x-rays done: Renal Ultrasound and CT Scan.   7.14.20: On recent CT and ultrasound an abnormality was found on left kidney. This was evaluated w/ renal U/S which did not see hydro. He denies any changes in urination aside from sx's he has been taking Flomax to manage for 10 years.   Prostate size estimated to be 96 ml.   9.22.2020: He continues to deny any major urinary sx's.   BPH:  Patient is currently treated with Flomax for his symptoms.   7.14.2020: Finasteride added to tamsulosin regimen.   9.22.20202: He continues on the above noted medical regimen with no real changes to urinary sx's -- he tolerates this well. He only reports mild sx's.   01/05/20: This man  presents for Urolift procedure. he is catheter dependent for BPH/retention.  Prostate volume   01/15/20: Patient underwent Urolift procedure on 03/22 with placement of 7 implants. He had unsuccessful voiding trial at [facility] office on 3/26. He presented back to the clinic later that afternoon and 16 French catheter was placed with 300 cc of urine noted. He presents today for repeat trial of void. He states that he continues to tolerate the catheter well. He denies any gross hematuria or fevers. No complaints of painful urgency. He remains on tamsulosin and Proscar.   Past Medical History:  Diagnosis Date  . Atrial fibrillation ([place])    Permanent  . CAD (coronary artery disease)    Catheterization 2004, mild/moderate nonobstructive disease  /   nuclear, 2007, small inferior scar // no ischemia  . Carotid bruit    Doppler, December 23, 2010, no significant carotid disease  . Clot    LA, small, in past  . Dizziness 03/2010   June, 2011   Mild, stop digoxin, felt better  . Ejection fraction 02/2009   EF 55-60%,Echo, May, 2010  . Elevated PSA   . GERD (gastroesophageal reflux disease)   . LVH (left ventricular hypertrophy) 02/2009    Mild, echo, 2010  . Mitral regurgitation    Mild, echo, flat closure  . Nausea vomiting and diarrhea 01/2010   Hospitalization, probably viral (or partial small bowel obstruction)/  Hospital 09/2010, recurrent possible partial small bowel obstruction, medical therapy  . Other malaise and fatigue   . Warfarin anticoagulation    Atrial fibrillation    Past Surgical History:  Procedure Laterality Date  . AGILE CAPSULE  10/18/2011   Procedure: AGILE CAPSULE;  Surgeon: Rogene Houston, MD;  [facility];  Service: Endoscopy;  Laterality: N/A;  730  . CARDIAC CATHETERIZATION  2004  . CHOLECYSTECTOMY  2010   Dr. Anthony Sar  . COLONOSCOPY  2008   [facility]  . COLONOSCOPY N/A 03/23/2016   Procedure: COLONOSCOPY;  Surgeon: Rogene Houston, MD;  [facility];   Service: Endoscopy;  Laterality: N/A;  1:00  . ESOPHAGOGASTRODUODENOSCOPY  11/08/2011   Procedure: ESOPHAGOGASTRODUODENOSCOPY (EGD);  Surgeon: Rogene Houston, MD;  [facility];  Service: Endoscopy;  Laterality: N/A;  300  . POLYPECTOMY  03/23/2016   Procedure: POLYPECTOMY;  Surgeon: Rogene Houston, MD;  [facility];  Service: Endoscopy;;  Cecal polyp removed via cold forceps recto-sigmoid polyp removed via cold snare    Home Medications:  Allergies as of 02/17/2020   No Known Allergies     Medication List       Accurate as of Feb 17, 2020  2:00 PM. If you have any questions, ask your nurse or doctor.        acetaminophen 500 MG tablet Commonly known as: TYLENOL Take 500 mg by mouth every 6 (six) hours as needed.   ALIGN PREBIOTIC-PROBIOTIC PO Take by mouth daily.   carvedilol 6.25 MG tablet Commonly known as: COREG TAKE ONE TABLET BY MOUTH TWICE DAILY.   diltiazem 180 MG 24 hr capsule Commonly known as: CARDIZEM CD Take 180 mg by mouth 2 (two) times daily.   famotidine 40 MG tablet Commonly known as: PEPCID TAKE ONE TABLET BY MOUTH TWICE DAILY.   finasteride 5 MG tablet Commonly known as: PROSCAR Take 5 mg by mouth daily.   fluticasone 50 MCG/ACT nasal spray Commonly known as: FLONASE Place 2 sprays into both nostrils daily.   levothyroxine 75 MCG tablet Commonly known as: SYNTHROID Take 75 mcg by mouth daily before breakfast.   magnesium gluconate 500 MG tablet Commonly known as: MAGONATE Take 500 mg by mouth 2 (two) times daily.   Meclizine HCl 25 MG Chew TAKE 1/2 TO 1 TABLET BY MOUTH EVERY 6 TO 8 HOURS   Melatonin 10 MG Tabs Take 10 mg by mouth at bedtime.   mesalamine 250 MG CR capsule Commonly known as: PENTASA Take 500 mg by mouth 4 (four) times daily.   Pentasa 500 MG CR capsule Generic drug: mesalamine Take 500 mg by mouth 4 (four) times daily.   nitroGLYCERIN 0.4 MG SL tablet Commonly known as: NITROSTAT Place 0.4 mg  under the tongue every 5 (five) minutes as needed. For chest pains. May repeat for up to 3 doses.   simvastatin 10 MG tablet Commonly known as: ZOCOR Take 10 mg by mouth at bedtime.   warfarin 2.5 MG tablet Commonly known as: COUMADIN Take as directed by the anticoagulation clinic. If you are unsure how to take this medication, talk to your nurse or doctor. Original instructions: Take 2.5 mg by mouth as directed. As directed by coumadin clinic       Allergies: No Known Allergies  Family History  Problem Relation Age of Onset  . Colon cancer Mother   . Heart disease Father   . Stroke Brother   . Healthy Daughter  Social History:  reports that he has never smoked. He has never used smokeless tobacco. He reports that he does not drink alcohol or use drugs.  ROS: A complete review of systems was performed.  All systems are negative except for pertinent findings as noted.  Physical Exam:  Vital signs in last 24 hours: BP 138/75   Pulse 71   Temp 97.7 F (36.5 C)   Ht 5' 8"  (1.727 m)   Wt 151 lb (68.5 kg)   BMI 22.96 kg/m  Constitutional:  Alert and oriented, No acute distress Cardiovascular: Regular rate  Respiratory: Normal respiratory effort GI: Abdomen is soft, nontender, nondistended, no abdominal masses. No CVAT.  Genitourinary: Normal male phallus, testes are descended bilaterally and non-tender and without masses, scrotum is normal in appearance without lesions or masses, perineum is normal on inspection. Lymphatic: No lymphadenopathy Neurologic: Grossly intact, no focal deficits Psychiatric: Normal mood and affect  Laboratory Data:  No results for input(s): WBC, HGB, HCT, PLT in the last 72 hours.  No results for input(s): NA, K, CL, GLUCOSE, BUN, CALCIUM, CREATININE in the last 72 hours.  Invalid input(s): CO3   Results for orders placed or performed in visit on 02/17/20 (from the past 24 hour(s))  POCT urinalysis dipstick     Status: Abnormal    Collection Time: 02/17/20  1:53 PM  Result Value Ref Range   Color, UA yellow    Clarity, UA     Glucose, UA Negative Negative   Bilirubin, UA neg    Ketones, UA neg    Spec Grav, UA 1.015 1.010 - 1.025   Blood, UA +++    pH, UA 5.0 5.0 - 8.0   Protein, UA Positive (A) Negative   Urobilinogen, UA 0.2 0.2 or 1.0 E.U./dL   Nitrite, UA neg    Leukocytes, UA Moderate (2+) (A) Negative   Appearance cloudy    Odor     No results found for this or any previous visit (from the past 240 hour(s)).  Renal Function: No results for input(s): CREATININE in the last 168 hours. CrCl cannot be calculated (Patient's most recent lab result is older than the maximum 21 days allowed.).  Radiologic Imaging: No results found.  [place] with history of retention, status post UroLift procedure in late March.  He is emptying much better, off of medical therapy (was on both tamsulosin and finasteride) but having more urgency and frequency.  Urine does look infected  Plan:  1.  Urine culture, he was given 5 days of Keflex  2.  I will see back in 1 month to recheck residual urine volume as well as urinalysis.

## 2020-02-18 ENCOUNTER — Encounter: Payer: Self-pay | Admitting: Urology

## 2020-02-18 DIAGNOSIS — R2681 Unsteadiness on feet: Secondary | ICD-10-CM | POA: Diagnosis not present

## 2020-02-18 DIAGNOSIS — H8110 Benign paroxysmal vertigo, unspecified ear: Secondary | ICD-10-CM | POA: Diagnosis not present

## 2020-02-19 LAB — URINE CULTURE: Culture: >=100000 — AB

## 2020-02-20 ENCOUNTER — Ambulatory Visit: Payer: PPO | Admitting: Urology

## 2020-02-20 ENCOUNTER — Telehealth: Payer: Self-pay

## 2020-02-20 DIAGNOSIS — R0789 Other chest pain: Secondary | ICD-10-CM | POA: Diagnosis not present

## 2020-02-20 DIAGNOSIS — Z6823 Body mass index (BMI) 23.0-23.9, adult: Secondary | ICD-10-CM | POA: Diagnosis not present

## 2020-02-20 NOTE — Telephone Encounter (Signed)
Pt. made aware and voiced understanding.

## 2020-02-20 NOTE — Telephone Encounter (Signed)
Pt made aware

## 2020-02-23 DIAGNOSIS — R2681 Unsteadiness on feet: Secondary | ICD-10-CM | POA: Diagnosis not present

## 2020-02-23 DIAGNOSIS — H8110 Benign paroxysmal vertigo, unspecified ear: Secondary | ICD-10-CM | POA: Diagnosis not present

## 2020-02-24 ENCOUNTER — Ambulatory Visit: Payer: PPO

## 2020-02-24 ENCOUNTER — Telehealth: Payer: Self-pay | Admitting: Urology

## 2020-02-24 ENCOUNTER — Other Ambulatory Visit: Payer: Self-pay

## 2020-02-24 ENCOUNTER — Other Ambulatory Visit (HOSPITAL_COMMUNITY)
Admission: RE | Admit: 2020-02-24 | Discharge: 2020-02-24 | Disposition: A | Discharge status: Home / Self Care | Payer: PPO | Source: Other Acute Inpatient Hospital | Attending: Urology | Admitting: Urology

## 2020-02-24 DIAGNOSIS — N3000 Acute cystitis without hematuria: Secondary | ICD-10-CM

## 2020-02-24 NOTE — Telephone Encounter (Signed)
Did abx I gave him help sx's? If so and he is worse I would like for him to drop off a scpecimen

## 2020-02-24 NOTE — Telephone Encounter (Signed)
Patient called and states that on Saturday he finished the medication he got last Tuesday. On Sunday he started having the same symptoms as before with frequency. He wants to know if he needs to bring a specimen by?

## 2020-02-24 NOTE — Telephone Encounter (Signed)
Pt was placed on keflex 5/4. Reports his symptoms are back. Any suggestions?

## 2020-02-24 NOTE — Telephone Encounter (Signed)
Pt coming tomorrow to drop off urine sample. Reports his symptoms are back and worse.

## 2020-02-24 NOTE — Progress Notes (Signed)
Pt dropped of urine sample. Sent to lab

## 2020-02-24 NOTE — Telephone Encounter (Signed)
OK--send results to me

## 2020-02-25 ENCOUNTER — Ambulatory Visit: Payer: PPO

## 2020-02-25 LAB — URINE CULTURE: Culture: NO GROWTH

## 2020-02-26 ENCOUNTER — Other Ambulatory Visit: Payer: Self-pay

## 2020-02-26 ENCOUNTER — Telehealth: Payer: Self-pay

## 2020-02-26 ENCOUNTER — Other Ambulatory Visit: Payer: Self-pay | Admitting: Urology

## 2020-02-26 ENCOUNTER — Telehealth: Payer: Self-pay | Admitting: Urology

## 2020-02-26 ENCOUNTER — Ambulatory Visit (INDEPENDENT_AMBULATORY_CARE_PROVIDER_SITE_OTHER): Payer: PPO | Admitting: *Deleted

## 2020-02-26 DIAGNOSIS — Z5181 Encounter for therapeutic drug level monitoring: Secondary | ICD-10-CM | POA: Diagnosis not present

## 2020-02-26 DIAGNOSIS — I4891 Unspecified atrial fibrillation: Secondary | ICD-10-CM

## 2020-02-26 DIAGNOSIS — N3020 Other chronic cystitis without hematuria: Secondary | ICD-10-CM

## 2020-02-26 LAB — POCT INR: INR: 3.1 — AB (ref 2.0–3.0)

## 2020-02-26 MED ORDER — METHENAMINE MANDELATE 1 G PO TABS: 1000.0000 mg | ORAL_TABLET | Freq: Two times a day (BID) | ORAL | 11 refills | Status: DC

## 2020-02-26 NOTE — Patient Instructions (Signed)
Take 0.5 tablets today, then continue 1.5 tablets daily except 1 tablet on Sundays and Thursdays Recheck in 3 weeks

## 2020-02-26 NOTE — Telephone Encounter (Signed)
-----   Message from Franchot Gallo, MD sent at 02/26/2020  1:49 PM EDT ----- Please call pt--culture neg but w/ all of sx's I want to put him on methenamine--not an abx but will decrease risk of UTI--I sent in and want to see him back as scheduled ----- Message ----- From: Iris Pert, LPN Sent: 2/49/3241  10:52 AM EDT To: Franchot Gallo, MD  Please review

## 2020-02-26 NOTE — Telephone Encounter (Signed)
Pt called and notified of new medication.

## 2020-02-26 NOTE — Telephone Encounter (Signed)
Pt. called and made aware of neg ua results and new rx. Pt. voiced understanding.

## 2020-02-26 NOTE — Telephone Encounter (Signed)
Patient called and requests a nurse return his call. He saw on mychart that his culture was ok but he is still having issues he wishes to discuss.

## 2020-03-01 ENCOUNTER — Telehealth: Payer: Self-pay | Admitting: Urology

## 2020-03-01 NOTE — Telephone Encounter (Signed)
Pt reports " going to the bathroom 24 times before lunch" reports his frequency is unchanged post urolift and is asking what else he can do. Fluid intake he reports is 16 oz with each meal. Denies any other symptoms

## 2020-03-01 NOTE — Telephone Encounter (Signed)
Pt requests a nurse return his call at [phone]

## 2020-03-02 ENCOUNTER — Other Ambulatory Visit: Payer: Self-pay

## 2020-03-02 ENCOUNTER — Other Ambulatory Visit (HOSPITAL_COMMUNITY)
Admission: RE | Admit: 2020-03-02 | Discharge: 2020-03-02 | Disposition: A | Discharge status: Home / Self Care | Payer: PPO | Source: Other Acute Inpatient Hospital | Attending: Urology | Admitting: Urology

## 2020-03-02 ENCOUNTER — Ambulatory Visit: Payer: PPO

## 2020-03-02 DIAGNOSIS — N3000 Acute cystitis without hematuria: Secondary | ICD-10-CM

## 2020-03-02 LAB — BLADDER SCAN AMB NON-IMAGING: Scan Result: 214

## 2020-03-02 NOTE — Progress Notes (Signed)
Pt came by office today with complaints of increase in frequency. Reports unable to sleep due to having to urinate every 15-20 mins. PVR obtained and spoke with Dr. Diona Fanti of results of 214cc. Urine sent for culture. Pt will return next week for OV and f/u

## 2020-03-02 NOTE — Telephone Encounter (Signed)
Pt left a message on the VM overnight regarding this issue.

## 2020-03-03 LAB — URINE CULTURE: Culture: <10000 — AB

## 2020-03-04 ENCOUNTER — Telehealth: Payer: Self-pay

## 2020-03-04 NOTE — Telephone Encounter (Signed)
Pt. Called and wanted to know results of his UA culture. Hope Cobb. LPN noted results were back and sent them on to Dr. Diona Fanti to review. I notified. Pt. Of this. He is waiting on results.

## 2020-03-05 DIAGNOSIS — Z130 Encounter for screening for diseases of the blood and blood-forming organs and certain disorders involving the immune mechanism: Secondary | ICD-10-CM | POA: Diagnosis not present

## 2020-03-05 DIAGNOSIS — E039 Hypothyroidism, unspecified: Secondary | ICD-10-CM | POA: Diagnosis not present

## 2020-03-05 DIAGNOSIS — I10 Essential (primary) hypertension: Secondary | ICD-10-CM | POA: Diagnosis not present

## 2020-03-05 DIAGNOSIS — K2100 Gastro-esophageal reflux disease with esophagitis, without bleeding: Secondary | ICD-10-CM | POA: Diagnosis not present

## 2020-03-05 DIAGNOSIS — N183 Chronic kidney disease, stage 3 unspecified: Secondary | ICD-10-CM | POA: Diagnosis not present

## 2020-03-05 DIAGNOSIS — E7800 Pure hypercholesterolemia, unspecified: Secondary | ICD-10-CM | POA: Diagnosis not present

## 2020-03-05 NOTE — Telephone Encounter (Signed)
Pt notified of urine culture results. Pt reports his home health nurse took a "dipstick and it had white cells in it" explained to the patient if he did not wipe with a cleansing towelette antiseptic he could have contamination in the urine. Pt still reports going to the bathroom frequently. Pt will keep scheduled appt this coming Tuesday

## 2020-03-05 NOTE — Telephone Encounter (Signed)
-----   Message from Franchot Gallo, MD sent at 03/05/2020  1:15 PM EDT ----- Notify patient that no specific bacteria grew out.  I will see him next week as planned.  No antibiotics needed presently. ----- Message ----- From: Iris Pert, LPN Sent: 11/25/4707   4:15 PM EDT To: Franchot Gallo, MD  Please review

## 2020-03-09 ENCOUNTER — Ambulatory Visit (INDEPENDENT_AMBULATORY_CARE_PROVIDER_SITE_OTHER): Payer: PPO | Admitting: Urology

## 2020-03-09 ENCOUNTER — Other Ambulatory Visit: Payer: Self-pay

## 2020-03-09 ENCOUNTER — Other Ambulatory Visit (HOSPITAL_COMMUNITY)
Admission: RE | Admit: 2020-03-09 | Discharge: 2020-03-09 | Disposition: A | Discharge status: Home / Self Care | Payer: PPO | Source: Other Acute Inpatient Hospital | Attending: Urology | Admitting: Urology

## 2020-03-09 ENCOUNTER — Encounter (INDEPENDENT_AMBULATORY_CARE_PROVIDER_SITE_OTHER): Payer: Self-pay | Admitting: Internal Medicine

## 2020-03-09 ENCOUNTER — Encounter: Payer: Self-pay | Admitting: Urology

## 2020-03-09 ENCOUNTER — Ambulatory Visit (INDEPENDENT_AMBULATORY_CARE_PROVIDER_SITE_OTHER): Payer: PPO | Admitting: Internal Medicine

## 2020-03-09 VITALS — BP 120/76 | HR 94 | Temp 97.7°F | Ht 68.0 in | Wt 152.3 lb

## 2020-03-09 VITALS — BP 135/73 | HR 86 | Temp 97.0°F | Ht 68.0 in | Wt 152.0 lb

## 2020-03-09 DIAGNOSIS — R339 Retention of urine, unspecified: Secondary | ICD-10-CM | POA: Insufficient documentation

## 2020-03-09 DIAGNOSIS — K5000 Crohn's disease of small intestine without complications: Secondary | ICD-10-CM

## 2020-03-09 DIAGNOSIS — K219 Gastro-esophageal reflux disease without esophagitis: Secondary | ICD-10-CM

## 2020-03-09 LAB — BLADDER SCAN AMB NON-IMAGING: Scan Result: 279.2

## 2020-03-09 LAB — POCT URINALYSIS DIPSTICK
Bilirubin, UA: NEGATIVE
Glucose, UA: NEGATIVE
Ketones, UA: NEGATIVE
Nitrite, UA: NEGATIVE
Protein, UA: POSITIVE — AB
Spec Grav, UA: 1.015 (ref 1.010–1.025)
Urobilinogen, UA: 0.2 E.U./dL
pH, UA: 6 (ref 5.0–8.0)

## 2020-03-09 MED ORDER — CIPROFLOXACIN HCL 500 MG PO TABS
500.0000 mg | ORAL_TABLET | Freq: Once | ORAL | Status: AC
  Administered 2020-03-09: 500 mg via ORAL

## 2020-03-09 MED ORDER — FAMOTIDINE 40 MG PO TABS: 40.0000 mg | ORAL_TABLET | Freq: Every day | ORAL | 3 refills | Status: DC

## 2020-03-09 NOTE — Patient Instructions (Signed)
Physician will call the results of blood test when copy received. If heartburn not well controlled with single dose of famotidine can go back to taking it twice daily.

## 2020-03-09 NOTE — Progress Notes (Signed)
H&P  Chief Complaint: Urinary Frequency  History of Present Illness:   5.25.2021: This man returns today for f/u, still c/o extremely bothersome urinary freq and nocturia. This last evening, for example, he reports having had 12x nocturia. He also continues to have a weak urinary stream which he likens to a "dribble." He continues to have some dysuria.  He does have LE swelling. Not on diuretics.  Past Medical History:  Diagnosis Date  . Atrial fibrillation ([place])    Permanent  . CAD (coronary artery disease)    Catheterization 2004, mild/moderate nonobstructive disease  /   nuclear, 2007, small inferior scar // no ischemia  . Carotid bruit    Doppler, December 23, 2010, no significant carotid disease  . Clot    LA, small, in past  . Dizziness 03/2010   June, 2011   Mild, stop digoxin, felt better  . Ejection fraction 02/2009   EF 55-60%,Echo, May, 2010  . Elevated PSA   . GERD (gastroesophageal reflux disease)   . LVH (left ventricular hypertrophy) 02/2009    Mild, echo, 2010  . Mitral regurgitation    Mild, echo, flat closure  . Nausea vomiting and diarrhea 01/2010   Hospitalization, probably viral (or partial small bowel obstruction)/ Hospital 09/2010, recurrent possible partial small bowel obstruction, medical therapy  . Other malaise and fatigue   . Warfarin anticoagulation    Atrial fibrillation    Past Surgical History:  Procedure Laterality Date  . AGILE CAPSULE  10/18/2011   Procedure: AGILE CAPSULE;  Surgeon: Rogene Houston, MD;  [facility]: [facility];  Service: Endoscopy;  Laterality: N/A;  730  . CARDIAC CATHETERIZATION  2004  . CHOLECYSTECTOMY  2010   Dr. Anthony Sar  . COLONOSCOPY  2008   [facility]  . COLONOSCOPY N/A 03/23/2016   Procedure: COLONOSCOPY;  Surgeon: Rogene Houston, MD;  Location: [facility];  Service: Endoscopy;  Laterality: N/A;  1:00  . ESOPHAGOGASTRODUODENOSCOPY  11/08/2011   Procedure: ESOPHAGOGASTRODUODENOSCOPY (EGD);  Surgeon: Rogene Houston,  MD;  Location: [facility];  Service: Endoscopy;  Laterality: N/A;  300  . POLYPECTOMY  03/23/2016   Procedure: POLYPECTOMY;  Surgeon: Rogene Houston, MD;  Location: [facility];  Service: Endoscopy;;  Cecal polyp removed via cold forceps recto-sigmoid polyp removed via cold snare    Home Medications:  Allergies as of 03/09/2020   No Known Allergies     Medication List       Accurate as of Mar 09, 2020  1:14 PM. If you have any questions, ask your nurse or doctor.        STOP taking these medications   cephALEXin 500 MG capsule Commonly known as: Keflex Stopped by: Hildred Laser, MD     TAKE these medications   acetaminophen 500 MG tablet Commonly known as: TYLENOL Take 500 mg by mouth every 6 (six) hours as needed.   ALIGN PREBIOTIC-PROBIOTIC PO Take by mouth daily.   carvedilol 6.25 MG tablet Commonly known as: COREG TAKE ONE TABLET BY MOUTH TWICE DAILY.   diltiazem 180 MG 24 hr capsule Commonly known as: CARDIZEM CD Take 180 mg by mouth 2 (two) times daily.   famotidine 40 MG tablet Commonly known as: PEPCID Take 1 tablet (40 mg total) by mouth at bedtime. What changed: when to take this Changed by: Hildred Laser, MD   finasteride 5 MG tablet Commonly known as: PROSCAR Take 5 mg by mouth daily.   fluticasone 50 MCG/ACT nasal spray  Commonly known as: FLONASE Place 2 sprays into both nostrils daily.   levothyroxine 75 MCG tablet Commonly known as: SYNTHROID Take 75 mcg by mouth daily before breakfast.   magnesium gluconate 500 MG tablet Commonly known as: MAGONATE Take 500 mg by mouth 2 (two) times daily.   Meclizine HCl 25 MG Chew TAKE 1/2 TO 1 TABLET BY MOUTH EVERY 6 TO 8 HOURS   Melatonin 10 MG Tabs Take 10 mg by mouth at bedtime.   mesalamine 250 MG CR capsule Commonly known as: PENTASA Take 500 mg by mouth 4 (four) times daily. What changed: Another medication with the same name was removed. Continue taking this medication, and follow the  directions you see here. Changed by: Hildred Laser, MD   methenamine 1 g tablet Commonly known as: MANDELAMINE Take 1 tablet (1,000 mg total) by mouth 2 (two) times daily.   methenamine 1 g tablet Commonly known as: HIPREX Take 1 g by mouth 2 (two) times daily.   nitroGLYCERIN 0.4 MG SL tablet Commonly known as: NITROSTAT Place 0.4 mg under the tongue every 5 (five) minutes as needed. For chest pains. May repeat for up to 3 doses.   simvastatin 10 MG tablet Commonly known as: ZOCOR Take 10 mg by mouth at bedtime.   warfarin 2.5 MG tablet Commonly known as: COUMADIN Take as directed by the anticoagulation clinic. If you are unsure how to take this medication, talk to your nurse or doctor. Original instructions: Take 2.5 mg by mouth as directed. As directed by coumadin clinic       Allergies: No Known Allergies  Family History  Problem Relation Age of Onset  . Colon cancer Mother   . Heart disease Father   . Stroke Brother   . Healthy Daughter     Social History:  reports that he has never smoked. He has never used smokeless tobacco. He reports that he does not drink alcohol or use drugs.  ROS: A complete review of systems was performed.  All systems are negative except for pertinent findings as noted.  Physical Exam:  Vital signs in last 24 hours: BP 135/73   Pulse 86   Temp (!) 97 F (36.1 C)   Ht 5' 8"  (1.727 m)   Wt 152 lb (68.9 kg)   BMI 23.11 kg/m  Constitutional:  Alert and oriented, No acute distress Cardiovascular: Regular rate   Genitourinary: Normal male phallus. Phimosis. testes are descended bilaterally and non-tender and without masses, scrotum is normal in appearance without lesions or masses, perineum is normal on inspection. Neurologic: Grossly intact, no focal deficits Psychiatric: Normal mood and affect    Results for orders placed or performed in visit on 03/09/20 (from the past 24 hour(s))  POCT urinalysis dipstick     Status: Abnormal     Collection Time: 03/09/20  1:08 PM  Result Value Ref Range   Color, UA yellow    Clarity, UA     Glucose, UA Negative Negative   Bilirubin, UA neg    Ketones, UA neg    Spec Grav, UA 1.015 1.010 - 1.025   Blood, UA +++    pH, UA 6.0 5.0 - 8.0   Protein, UA Positive (A) Negative   Urobilinogen, UA 0.2 0.2 or 1.0 E.U./dL   Nitrite, UA neg    Leukocytes, UA Moderate (2+) (A) Negative   Appearance cloudy    Odor     Recent Results (from the past 240 hour(s))  Culture, Urine  Status: Abnormal   Collection Time: 03/02/20  4:24 PM   Specimen: Urine, Clean Catch  Result Value Ref Range Status   Specimen Description   Final    URINE, CLEAN CATCH Performed at [facility], [address] [place], [place] [address]    Special Requests   Final    NONE Performed at [facility], [address], [place], [place] [address]    Culture (A)  Final    <10,000 COLONIES/mL INSIGNIFICANT GROWTH Performed at [facility] [address] [place], [place] [address]    Report Status 03/03/2020 FINAL  Final   Cystoscopy Procedure Note:  Indication: s/p Uro;ift with persistent LUTS  After informed consent and discussion of the procedure and its risks, [name] was positioned and prepped in the standard fashion.  Cystoscopy was performed with a flexible cystoscope.   Findings: Urethra:No obstruction/lesion Prostate:Still somewhat obstructive w/ bilobar hypertrophy Ureteral orifices:nml Bladder:Urine slightly cloudy. No urothelial lesions.  The patient tolerated the procedure well.    Impression/Assessment:  Persistent LUTS despite Urolift w/ persistent obstructive prostate  Plan:  1. Will culture urine today and treat if needed  2. Will call to schedule TURP   3. OK to try flomax for now

## 2020-03-09 NOTE — Progress Notes (Signed)
See note

## 2020-03-09 NOTE — Progress Notes (Signed)
Presenting complaint;  Follow-up for small bowel Crohn's disease and GERD.  Database and subjective:  Patient is 86 year old Caucasian male was history of small bowel Crohn's disease status post bowel resection in October 2020 who is here for scheduled visit.  Postop course was complicated.  He had C. difficile colitis.  When I saw him back in December 2020 I felt that he had small intestinal bacterial overgrowth and he was treated with Flagyl with symptomatic improvement.  He had follow-up MR and small bowel follow-through and he did not have high-grade stricture.  He was treated with budesonide for 6 weeks and now he is maintained on Pentasa.  He was having issues with heartburn on his last visit and famotidine dose was doubled.  He says he is doing well from GI standpoint.  He is not having heartburn anymore.  He is having 3-4 bowel movements per day.  Most of his stools are soft.  He denies loose watery stools melena or rectal bleeding.  He is also not having nocturnal bowel movements.  He says bloating has decreased significantly and he sometimes has to take Gas-X usually at night.  He states he has never taken nitroglycerin.  He carries prescription just in case.  He has gained 11 pounds since his last visit about 4 months ago. His biggest problem is 1 of increased urinary frequency.  He says last night he had to get up 12 times to urinate.  He was not able to sleep.  He is under care of Dr. Diona Fanti and he has appointment later this afternoon.  Current Medications: Outpatient Encounter Medications as of 03/09/2020  Medication Sig  . acetaminophen (TYLENOL) 500 MG tablet Take 500 mg by mouth every 6 (six) hours as needed.  . Bacillus Coagulans-Inulin (ALIGN PREBIOTIC-PROBIOTIC PO) Take by mouth daily.   . carvedilol (COREG) 6.25 MG tablet TAKE ONE TABLET BY MOUTH TWICE DAILY.  Marland Kitchen diltiazem (CARDIZEM CD) 180 MG 24 hr capsule Take 180 mg by mouth 2 (two) times daily.   . famotidine (PEPCID) 40  MG tablet TAKE ONE TABLET BY MOUTH TWICE DAILY.  . finasteride (PROSCAR) 5 MG tablet Take 5 mg by mouth daily.  . fluticasone (FLONASE) 50 MCG/ACT nasal spray Place 2 sprays into both nostrils daily.  Marland Kitchen levothyroxine (SYNTHROID, LEVOTHROID) 75 MCG tablet Take 75 mcg by mouth daily before breakfast.  . magnesium gluconate (MAGONATE) 500 MG tablet Take 500 mg by mouth 2 (two) times daily.   . Meclizine HCl 25 MG CHEW TAKE 1/2 TO 1 TABLET BY MOUTH EVERY 6 TO 8 HOURS  . Melatonin 10 MG TABS Take 10 mg by mouth at bedtime.   . mesalamine (PENTASA) 250 MG CR capsule Take 500 mg by mouth 4 (four) times daily.  . methenamine (MANDELAMINE) 1 g tablet Take 1 tablet (1,000 mg total) by mouth 2 (two) times daily.  . nitroGLYCERIN (NITROSTAT) 0.4 MG SL tablet Place 0.4 mg under the tongue every 5 (five) minutes as needed. For chest pains. May repeat for up to 3 doses.  . simvastatin (ZOCOR) 10 MG tablet Take 10 mg by mouth at bedtime.   Marland Kitchen warfarin (COUMADIN) 2.5 MG tablet Take 2.5 mg by mouth as directed. As directed by coumadin clinic   . [DISCONTINUED] cephALEXin (KEFLEX) 500 MG capsule Take 1 capsule (500 mg total) by mouth 2 (two) times daily. (Patient not taking: Reported on 03/09/2020)  . [DISCONTINUED] PENTASA 500 MG CR capsule Take 500 mg by mouth 4 (four) times daily.  No facility-administered encounter medications on file as of 03/09/2020.     Objective: Blood pressure 120/76, pulse 94, temperature 97.7 F (36.5 C), temperature source Temporal, height 5' 8"  (1.727 m), weight 152 lb 4.8 oz (69.1 kg). Patient is alert and in no acute distress. He is wearing facial mask. Conjunctiva is pink. Sclera is nonicteric Oropharyngeal mucosa is normal. No neck masses or thyromegaly noted. Cardiac exam with regular rhythm normal S1 and S2. No murmur or gallop noted. Lungs are clear to auscultation. Abdomen is symmetrical.  He has midline scar.  Bowel sounds are normal.  Percussion note is not  sympathetic.  On palpation abdomen is soft.  He has mild midepigastric tenderness.  No organomegaly or masses. No LE edema or clubbing noted.  Labs/studies Results:  CBC Latest Ref Rng & Units 09/25/2019 06/15/2017 04/03/2014  WBC 3.8 - 10.8 Thousand/uL 12.3(H) 6.0 8.0  Hemoglobin 13.2 - 17.1 g/dL 12.2(L) 12.8(L) 15.2  Hematocrit 38.5 - 50.0 % 37.1(L) 39.2 45.6  Platelets 140 - 400 Thousand/uL 258 193 250.0    CMP Latest Ref Rng & Units 09/25/2019 12/10/2018 08/02/2017  Glucose 65 - 139 mg/dL 97 101 -  BUN 7 - 25 mg/dL 15 14 -  Creatinine 0.70 - 1.11 mg/dL 0.95 1.40(H) 1.31(H)  Sodium 135 - 146 mmol/L 137 141 -  Potassium 3.5 - 5.3 mmol/L 4.3 4.0 -  Chloride 98 - 110 mmol/L 102 105 -  CO2 20 - 32 mmol/L 27 28 -  Calcium 8.6 - 10.3 mg/dL 8.4(L) 8.7 -  Total Protein 6.1 - 8.1 g/dL 5.7(L) - -  Total Bilirubin 0.2 - 1.2 mg/dL 0.4 - -  Alkaline Phos 40 - 115 U/L - - -  AST 10 - 35 U/L 16 - -  ALT 9 - 46 U/L 21 - -    Hepatic Function Latest Ref Rng & Units 09/25/2019 06/15/2017  Total Protein 6.1 - 8.1 g/dL 5.7(L) 6.2  Albumin 3.6 - 5.1 g/dL - 3.9  AST 10 - 35 U/L 16 14  ALT 9 - 46 U/L 21 10  Alk Phosphatase 40 - 115 U/L - 65  Total Bilirubin 0.2 - 1.2 mg/dL 0.4 0.6  Bilirubin, Direct <=0.2 mg/dL - 0.1    Lab Results  Component Value Date   CRP 6.3 09/25/2019      Assessment:  #1.  Small bowel Crohn's disease.  Status post resection of 26 cm of small bowel in October 2020.  He appears to be in remission.  He will stay on oral mesalamine as long as it is working.  #2.  GERD.  GERD symptoms may have been due to small bowel malfunction. He is doing well with famotidine 80 mg/day.  I believe dose could be reduced..   Plan:  Request copy of recent blood work from Dr. Edythe Lynn office. CRP in near future unless he already had this test done along with rest of his blood work. Decrease famotidine to 40 mg by mouth daily at bedtime. Patient advised to call office if he has  abdominal cramping diarrhea or rectal bleeding. Office visit in 6 months.

## 2020-03-10 DIAGNOSIS — E039 Hypothyroidism, unspecified: Secondary | ICD-10-CM | POA: Diagnosis not present

## 2020-03-10 DIAGNOSIS — K56600 Partial intestinal obstruction, unspecified as to cause: Secondary | ICD-10-CM | POA: Diagnosis not present

## 2020-03-10 DIAGNOSIS — E7800 Pure hypercholesterolemia, unspecified: Secondary | ICD-10-CM | POA: Diagnosis not present

## 2020-03-10 DIAGNOSIS — Z6823 Body mass index (BMI) 23.0-23.9, adult: Secondary | ICD-10-CM | POA: Diagnosis not present

## 2020-03-10 DIAGNOSIS — R7301 Impaired fasting glucose: Secondary | ICD-10-CM | POA: Diagnosis not present

## 2020-03-10 DIAGNOSIS — I10 Essential (primary) hypertension: Secondary | ICD-10-CM | POA: Diagnosis not present

## 2020-03-11 ENCOUNTER — Telehealth: Payer: Self-pay | Admitting: Urology

## 2020-03-11 NOTE — Telephone Encounter (Signed)
Pt requests nurse return his call due to urinary issues.

## 2020-03-11 NOTE — Telephone Encounter (Signed)
Pt. Called again asking about cath. I explained note had been sent and we could not do it today because no dr is onsite. He asked that we call him back. I reassured him as soon we heard from dr.we would.

## 2020-03-12 ENCOUNTER — Other Ambulatory Visit: Payer: Self-pay | Admitting: Urology

## 2020-03-12 LAB — URINE CULTURE: Culture: 20000 — AB

## 2020-03-12 NOTE — Telephone Encounter (Signed)
I called and offered pt an appointment for this afternoon and was unable to make it. Pt will come Wednesday for catheter placement.

## 2020-03-12 NOTE — Telephone Encounter (Signed)
I guess this is the guy--ok to get one

## 2020-03-17 ENCOUNTER — Ambulatory Visit: Payer: PPO

## 2020-03-17 ENCOUNTER — Ambulatory Visit (INDEPENDENT_AMBULATORY_CARE_PROVIDER_SITE_OTHER): Payer: PPO | Admitting: *Deleted

## 2020-03-17 ENCOUNTER — Other Ambulatory Visit: Payer: Self-pay

## 2020-03-17 DIAGNOSIS — I4891 Unspecified atrial fibrillation: Secondary | ICD-10-CM | POA: Diagnosis not present

## 2020-03-17 DIAGNOSIS — Z5181 Encounter for therapeutic drug level monitoring: Secondary | ICD-10-CM

## 2020-03-17 LAB — POCT INR: INR: 2.8 (ref 2.0–3.0)

## 2020-03-17 NOTE — Patient Instructions (Signed)
Continue warfarin 1.5 tablets daily except 1 tablet on Sundays and Thursdays Pending TURP on 04/22/20  Will hold warfarin 5 days before procedure.  Clearance faxed to [facility]   Recheck in 3 weeks

## 2020-03-23 ENCOUNTER — Other Ambulatory Visit: Payer: Self-pay

## 2020-03-23 ENCOUNTER — Ambulatory Visit: Payer: PPO | Admitting: Urology

## 2020-03-23 ENCOUNTER — Encounter: Payer: Self-pay | Admitting: Urology

## 2020-03-23 VITALS — BP 154/62 | HR 99 | Temp 97.3°F | Ht 68.0 in | Wt 154.0 lb

## 2020-03-23 DIAGNOSIS — R339 Retention of urine, unspecified: Secondary | ICD-10-CM | POA: Diagnosis not present

## 2020-03-23 LAB — POCT URINALYSIS DIPSTICK
Glucose, UA: NEGATIVE
Nitrite, UA: NEGATIVE
Protein, UA: NEGATIVE
Spec Grav, UA: >=1.03 — AB (ref 1.010–1.025)
Urobilinogen, UA: NEGATIVE E.U./dL — AB
pH, UA: 6 (ref 5.0–8.0)

## 2020-03-23 NOTE — Progress Notes (Signed)
For ROS please see progress notes

## 2020-03-23 NOTE — Progress Notes (Signed)
H&P  Chief Complaint: BPH w/ LUTS  History of Present Illness:   6.8.2021: He returns today c/o stable and bothersome urinary sx's. He has been scheduled for TURP, but decided to keep today's scheduled follow-up on his recent urolift to discuss possibility of starting on catheter. He has been wearing depends to control his urge incontinence and has been having some good nights with regards to his nocturia. He states that he can tolerate his current urinary pattern well enough, but thinks a catheter may improve his QoL. His only hesitation with having a catheter between now and his upcoming TURP is fear of a significant infection.   IPSS Questionnaire (AUA-7): Over the past month.   1)  How often have you had a sensation of not emptying your bladder completely after you finish urinating?  4 - More than half the time  2)  How often have you had to urinate again less than two hours after you finished urinating? 4 - More than half the time  3)  How often have you found you stopped and started again several times when you urinated?  0 - Not at all  4) How difficult have you found it to postpone urination?  5 - Almost always  5) How often have you had a weak urinary stream?  3 - About half the time  6) How often have you had to push or strain to begin urination?  0 - Not at all  7) How many times did you most typically get up to urinate from the time you went to bed until the time you got up in the morning?  5 - 5+ times  Total score:  0-7 mildly symptomatic   8-19 moderately symptomatic   20-35 severely symptomatic   Total: 21 QoL: 6  Past Medical History:  Diagnosis Date  . Atrial fibrillation ([place])    Permanent  . CAD (coronary artery disease)    Catheterization 2004, mild/moderate nonobstructive disease  /   nuclear, 2007, small inferior scar // no ischemia  . Carotid bruit    Doppler, December 23, 2010, no significant carotid disease  . Clot    LA, small, in past  . Dizziness 03/2010   June, 2011   Mild, stop digoxin, felt better  . Ejection fraction 02/2009   EF 55-60%,Echo, May, 2010  . Elevated PSA   . GERD (gastroesophageal reflux disease)   . LVH (left ventricular hypertrophy) 02/2009    Mild, echo, 2010  . Mitral regurgitation    Mild, echo, flat closure  . Nausea vomiting and diarrhea 01/2010   Hospitalization, probably viral (or partial small bowel obstruction)/ Hospital 09/2010, recurrent possible partial small bowel obstruction, medical therapy  . Other malaise and fatigue   . Warfarin anticoagulation    Atrial fibrillation    Past Surgical History:  Procedure Laterality Date  . AGILE CAPSULE  10/18/2011   Procedure: AGILE CAPSULE;  Surgeon: Rogene Houston, MD;  [facility];  Service: Endoscopy;  Laterality: N/A;  730  . CARDIAC CATHETERIZATION  2004  . CHOLECYSTECTOMY  2010   Dr. Anthony Sar  . COLONOSCOPY  2008   [facility]  . COLONOSCOPY N/A 03/23/2016   Procedure: COLONOSCOPY;  Surgeon: Rogene Houston, MD;  [facility];  Service: Endoscopy;  Laterality: N/A;  1:00  . ESOPHAGOGASTRODUODENOSCOPY  11/08/2011   Procedure: ESOPHAGOGASTRODUODENOSCOPY (EGD);  Surgeon: Rogene Houston, MD;  [facility];  Service: Endoscopy;  Laterality: N/A;  300  .  POLYPECTOMY  03/23/2016   Procedure: POLYPECTOMY;  Surgeon: Rogene Houston, MD;  [facility];  Service: Endoscopy;;  Cecal polyp removed via cold forceps recto-sigmoid polyp removed via cold snare    Home Medications:  Allergies as of 03/23/2020   No Known Allergies     Medication List       Accurate as of March 23, 2020  3:08 PM. If you have any questions, ask your nurse or doctor.        acetaminophen 500 MG tablet Commonly known as: TYLENOL Take 500 mg by mouth every 6 (six) hours as needed.   ALIGN PREBIOTIC-PROBIOTIC PO Take by mouth daily.   carvedilol 6.25 MG tablet Commonly known as: COREG TAKE ONE TABLET BY MOUTH TWICE DAILY.   diltiazem 180 MG 24  hr capsule Commonly known as: CARDIZEM CD Take 180 mg by mouth 2 (two) times daily.   famotidine 40 MG tablet Commonly known as: PEPCID Take 1 tablet (40 mg total) by mouth at bedtime.   finasteride 5 MG tablet Commonly known as: PROSCAR Take 5 mg by mouth daily.   fluticasone 50 MCG/ACT nasal spray Commonly known as: FLONASE Place 2 sprays into both nostrils daily.   levothyroxine 75 MCG tablet Commonly known as: SYNTHROID Take 75 mcg by mouth daily before breakfast.   magnesium gluconate 500 MG tablet Commonly known as: MAGONATE Take 500 mg by mouth 2 (two) times daily.   Meclizine HCl 25 MG Chew TAKE 1/2 TO 1 TABLET BY MOUTH EVERY 6 TO 8 HOURS   Melatonin 10 MG Tabs Take 10 mg by mouth at bedtime.   mesalamine 250 MG CR capsule Commonly known as: PENTASA Take 500 mg by mouth 4 (four) times daily.   methenamine 1 g tablet Commonly known as: MANDELAMINE Take 1 tablet (1,000 mg total) by mouth 2 (two) times daily.   methenamine 1 g tablet Commonly known as: HIPREX Take 1 g by mouth 2 (two) times daily.   nitroGLYCERIN 0.4 MG SL tablet Commonly known as: NITROSTAT Place 0.4 mg under the tongue every 5 (five) minutes as needed. For chest pains. May repeat for up to 3 doses.   simvastatin 10 MG tablet Commonly known as: ZOCOR Take 10 mg by mouth at bedtime.   warfarin 2.5 MG tablet Commonly known as: COUMADIN Take as directed by the anticoagulation clinic. If you are unsure how to take this medication, talk to your nurse or doctor. Original instructions: Take 2.5 mg by mouth as directed. As directed by coumadin clinic       Allergies: No Known Allergies  Family History  Problem Relation Age of Onset  . Colon cancer Mother   . Heart disease Father   . Stroke Brother   . Healthy Daughter     Social History:  reports that he has never smoked. He has never used smokeless tobacco. He reports that he does not drink alcohol or use drugs.  ROS: A complete  review of systems was performed.  All systems are negative except for pertinent findings as noted.  Physical Exam:  Vital signs in last 24 hours: BP (!) 154/62   Pulse 99   Temp (!) 97.3 F (36.3 C)   Ht 5' 8"  (1.727 m)   Wt 154 lb (69.9 kg)   BMI 23.42 kg/m  Constitutional:  Alert and oriented, No acute distress Cardiovascular: Regular rate  Respiratory: Normal respiratory effort GI: Abdomen is soft, nontender, nondistended, no abdominal masses. No CVAT.  Genitourinary: Not completed Lymphatic: No lymphadenopathy Neurologic: Grossly intact, no focal deficits Psychiatric: Normal mood and affect    Results for orders placed or performed in visit on 03/23/20 (from the past 24 hour(s))  POCT urinalysis dipstick     Status: Abnormal   Collection Time: 03/23/20  3:06 PM  Result Value Ref Range   Color, UA dk yellow    Clarity, UA clear    Glucose, UA Negative Negative   Bilirubin, UA large    Ketones, UA small    Spec Grav, UA >=1.030 (A) 1.010 - 1.025   Blood, UA small    pH, UA 6.0 5.0 - 8.0   Protein, UA Negative Negative   Urobilinogen, UA negative (A) 0.2 or 1.0 E.U./dL   Nitrite, UA neg    Leukocytes, UA Large (3+) (A) Negative   Appearance clear    Odor     No results found for this or any previous visit (from the past 240 hour(s)).  Renal Function: No results for input(s): CREATININE in the last 168 hours. CrCl cannot be calculated (Patient's most recent lab result is older than the maximum 21 days allowed.).  Radiologic Imaging: No results found.  Impression/Assessment:  He is already scheduled for TURP. He can seemingly tolerate his current urinary pattern well enough now that he is scheduled for TURP.   Plan:  1. Keep scheduled TURP.  2. Will plan and schedule appropriate follow-up post-op.   3. He will drop off urine on 6.22.2021 for UA and culture  4. 20 mins spent answering pt's questions

## 2020-04-01 ENCOUNTER — Telehealth: Payer: Self-pay | Admitting: Cardiovascular Disease

## 2020-04-01 NOTE — Telephone Encounter (Signed)
Pt c/o swelling in both feet for the last 2 months - went to pcp who gave him compression stockings which has not helped - pt weight 157lbs but was told to try to gain some weight after d/c in December - denies any SOB/dizziness/chest pain - says BP/HR has been WNL - Dr Quintin Alto suggested that pt contact cardiology that swelling in his feet may be coming from diltiazem 180 mg bid (pt has been taking dilt since 2004) denies any other medication changes

## 2020-04-01 NOTE — Telephone Encounter (Signed)
Can he get a PA appt with Jonni Sanger 1-2 weeks  Reinaldo Berber MD

## 2020-04-01 NOTE — Telephone Encounter (Signed)
Pt complaining of foot pain/swollen   [place] call [phone]   Thanks renee

## 2020-04-02 DIAGNOSIS — H04123 Dry eye syndrome of bilateral lacrimal glands: Secondary | ICD-10-CM | POA: Diagnosis not present

## 2020-04-06 NOTE — Progress Notes (Deleted)
Cardiology Office Note  Date: 04/06/2020   ID: [name], DOB Dec 03, 1930, MRN [id number]  PCP:  Manon Hilding, MD  Cardiologist:  Kate Sable, MD Electrophysiologist:  None   Chief Complaint: Leg swelling  History of Present Illness: [name] is a 86 y.o. male with a history of atrial fibrillation, CAD, Crohn's disease, urinary retention.  Last seen by Dr. Bronson Ing 12/16/2019.  At a previous echocardiogram 10/22/2018 showing normal LV function with EF of 55% with mild left atrial dilatation.  Low risk stress test October 2018 demonstrated prior inferoseptal MI without ischemia.  He was planning a UroLift procedure at that time for urinary retention.  He denied any chest pain, palpitations, lower extremity edema or shortness of breath.  His atrial fibrillation was symptomatically stable on long-acting diltiazem 180 mg twice daily anticoagulated with warfarin.  Coronary artery disease was symptomatically stable continue carvedilol and simvastatin.  Blood pressure was within normal limits.  With no change in therapy.  He presents today for bilateral leg swelling.  Past Medical History:  Diagnosis Date  . Atrial fibrillation ([place])    Permanent  . CAD (coronary artery disease)    Catheterization 2004, mild/moderate nonobstructive disease  /   nuclear, 2007, small inferior scar // no ischemia  . Carotid bruit    Doppler, December 23, 2010, no significant carotid disease  . Clot    LA, small, in past  . Dizziness 03/2010   June, 2011   Mild, stop digoxin, felt better  . Ejection fraction 02/2009   EF 55-60%,Echo, May, 2010  . Elevated PSA   . GERD (gastroesophageal reflux disease)   . LVH (left ventricular hypertrophy) 02/2009    Mild, echo, 2010  . Mitral regurgitation    Mild, echo, flat closure  . Nausea vomiting and diarrhea 01/2010   Hospitalization, probably viral (or partial small bowel obstruction)/ Hospital 09/2010, recurrent possible partial small bowel  obstruction, medical therapy  . Other malaise and fatigue   . Warfarin anticoagulation    Atrial fibrillation    Past Surgical History:  Procedure Laterality Date  . AGILE CAPSULE  10/18/2011   Procedure: AGILE CAPSULE;  Surgeon: Rogene Houston, MD;  [facility];  Service: Endoscopy;  Laterality: N/A;  730  . CARDIAC CATHETERIZATION  2004  . CHOLECYSTECTOMY  2010   Dr. Anthony Sar  . COLONOSCOPY  2008   [facility]  . COLONOSCOPY N/A 03/23/2016   Procedure: COLONOSCOPY;  Surgeon: Rogene Houston, MD;  [facility];  Service: Endoscopy;  Laterality: N/A;  1:00  . ESOPHAGOGASTRODUODENOSCOPY  11/08/2011   Procedure: ESOPHAGOGASTRODUODENOSCOPY (EGD);  Surgeon: Rogene Houston, MD;  [facility];  Service: Endoscopy;  Laterality: N/A;  300  . POLYPECTOMY  03/23/2016   Procedure: POLYPECTOMY;  Surgeon: Rogene Houston, MD;  [facility];  Service: Endoscopy;;  Cecal polyp removed via cold forceps recto-sigmoid polyp removed via cold snare    Current Outpatient Medications  Medication Sig Dispense Refill  . acetaminophen (TYLENOL) 500 MG tablet Take 500 mg by mouth every 6 (six) hours as needed.    . Bacillus Coagulans-Inulin (ALIGN PREBIOTIC-PROBIOTIC PO) Take by mouth daily.     . carvedilol (COREG) 6.25 MG tablet TAKE ONE TABLET BY MOUTH TWICE DAILY. 60 tablet 6  . diltiazem (CARDIZEM CD) 180 MG 24 hr capsule Take 180 mg by mouth 2 (two) times daily.     . famotidine (PEPCID) 40 MG tablet Take  1 tablet (40 mg total) by mouth at bedtime. 90 tablet 3  . finasteride (PROSCAR) 5 MG tablet Take 5 mg by mouth daily.    . fluticasone (FLONASE) 50 MCG/ACT nasal spray Place 2 sprays into both nostrils daily.    Marland Kitchen levothyroxine (SYNTHROID, LEVOTHROID) 75 MCG tablet Take 75 mcg by mouth daily before breakfast.    . magnesium gluconate (MAGONATE) 500 MG tablet Take 500 mg by mouth 2 (two) times daily.     . Meclizine HCl 25 MG CHEW TAKE 1/2 TO 1 TABLET BY MOUTH EVERY  6 TO 8 HOURS    . Melatonin 10 MG TABS Take 10 mg by mouth at bedtime.     . mesalamine (PENTASA) 250 MG CR capsule Take 500 mg by mouth 4 (four) times daily.    . methenamine (HIPREX) 1 g tablet Take 1 g by mouth 2 (two) times daily.    . methenamine (MANDELAMINE) 1 g tablet Take 1 tablet (1,000 mg total) by mouth 2 (two) times daily. 60 tablet 11  . nitroGLYCERIN (NITROSTAT) 0.4 MG SL tablet Place 0.4 mg under the tongue every 5 (five) minutes as needed. For chest pains. May repeat for up to 3 doses.    . simvastatin (ZOCOR) 10 MG tablet Take 10 mg by mouth at bedtime.     Marland Kitchen warfarin (COUMADIN) 2.5 MG tablet Take 2.5 mg by mouth as directed. As directed by coumadin clinic      No current facility-administered medications for this visit.   Allergies:  Patient has no known allergies.   Social History: The patient  reports that he has never smoked. He has never used smokeless tobacco. He reports that he does not drink alcohol and does not use drugs.   Family History: The patient's family history includes Colon cancer in his mother; Healthy in his daughter; Heart disease in his father; Stroke in his brother.   ROS:  Please see the history of present illness. Otherwise, complete review of systems is positive for none.  All other systems are reviewed and negative.   Physical Exam: VS:  There were no vitals taken for this visit., BMI There is no height or weight on file to calculate BMI.  Wt Readings from Last 3 Encounters:  03/23/20 154 lb (69.9 kg)  03/09/20 152 lb (68.9 kg)  03/09/20 152 lb 4.8 oz (69.1 kg)    General: Patient appears comfortable at rest. HEENT: Conjunctiva and lids normal, oropharynx clear with moist mucosa. Neck: Supple, no elevated JVP or carotid bruits, no thyromegaly. Lungs: Clear to auscultation, nonlabored breathing at rest. Cardiac: Regular rate and rhythm, no S3 or significant systolic murmur, no pericardial rub. Abdomen: Soft, nontender, no hepatomegaly, bowel  sounds present, no guarding or rebound. Extremities: No pitting edema, distal pulses 2+. Skin: Warm and dry. Musculoskeletal: No kyphosis. Neuropsychiatric: Alert and oriented x3, affect grossly appropriate.  ECG:  {EKG/Telemetry Strips Reviewed:[phone]}  Recent Labwork: 09/25/2019: ALT 21; AST 16; BUN 15; Creat 0.95; Hemoglobin 12.2; Platelets 258; Potassium 4.3; Sodium 137  No results found for: CHOL, TRIG, HDL, CHOLHDL, VLDL, LDLCALC, LDLDIRECT  Other Studies Reviewed Today:  Echocardiogram 10/22/2018 Study Conclusions   - Left ventricle: The cavity size was normal. Wall thickness was  normal. Systolic function was normal. The estimated ejection  fraction was 55%. Wall motion was normal; there were no regional  wall motion abnormalities. The study was not technically  sufficient to allow evaluation of LV diastolic dysfunction due to  atrial fibrillation.  -  Aortic valve: There was trivial regurgitation.  - Left atrium: The atrium was mildly dilated.  Nuclear stress test 08/14/2017 Study Result  Narrative & Impression   There was no ST segment deviation noted during stress.  Findings consistent with prior inferoseptal myocardial infarction without current ischemia.  This is a low risk study.  The left ventricular ejection fraction is normal (55-65%).       Assessment and Plan:  1. Bilateral lower extremity edema   2. Permanent atrial fibrillation ([place])   3. Coronary artery disease of native artery of native heart with stable angina pectoris ([place])   4. Essential hypertension    1. Bilateral lower extremity edema ***  2. Permanent atrial fibrillation ([facility]) ***  3. Coronary artery disease of native artery of native heart with stable angina pectoris ([facility]) ***  4. Essential hypertension *** Medication Adjustments/Labs and Tests Ordered: Current medicines are reviewed at length with the patient today.  Concerns regarding medicines are outlined  above.   Disposition: Follow-up with ***  Signed, Levell July, NP 04/06/2020 3:36 PM    [place] [facility] [address], [place], [place] [address] Phone: [phone]; Fax: [phone]

## 2020-04-07 ENCOUNTER — Ambulatory Visit (INDEPENDENT_AMBULATORY_CARE_PROVIDER_SITE_OTHER): Payer: PPO | Admitting: *Deleted

## 2020-04-07 ENCOUNTER — Other Ambulatory Visit: Payer: Self-pay

## 2020-04-07 DIAGNOSIS — I4891 Unspecified atrial fibrillation: Secondary | ICD-10-CM

## 2020-04-07 DIAGNOSIS — Z5181 Encounter for therapeutic drug level monitoring: Secondary | ICD-10-CM

## 2020-04-07 LAB — POCT INR: INR: 3.4 — AB (ref 2.0–3.0)

## 2020-04-07 NOTE — Patient Instructions (Addendum)
Hold warfarin tonight then resume 1.5 tablets daily except 1 tablet on Sundays and Thursdays Pending TURP on 04/22/20  Will hold warfarin 5 days before procedure.   Take last dose of warfarin on Friday 03/17/20 and resume after procedure per MD instructions. Clearance faxed to [facility]   Recheck in 3 weeks

## 2020-04-08 ENCOUNTER — Encounter: Payer: Self-pay | Admitting: Family Medicine

## 2020-04-08 ENCOUNTER — Ambulatory Visit (INDEPENDENT_AMBULATORY_CARE_PROVIDER_SITE_OTHER): Payer: PPO | Admitting: Family Medicine

## 2020-04-08 VITALS — BP 138/80 | HR 71 | Ht 68.0 in | Wt 161.0 lb

## 2020-04-08 DIAGNOSIS — I10 Essential (primary) hypertension: Secondary | ICD-10-CM

## 2020-04-08 DIAGNOSIS — R0600 Dyspnea, unspecified: Secondary | ICD-10-CM | POA: Diagnosis not present

## 2020-04-08 DIAGNOSIS — I25118 Atherosclerotic heart disease of native coronary artery with other forms of angina pectoris: Secondary | ICD-10-CM

## 2020-04-08 DIAGNOSIS — R600 Localized edema: Secondary | ICD-10-CM | POA: Diagnosis not present

## 2020-04-08 DIAGNOSIS — I4821 Permanent atrial fibrillation: Secondary | ICD-10-CM

## 2020-04-08 DIAGNOSIS — R0609 Other forms of dyspnea: Secondary | ICD-10-CM

## 2020-04-08 NOTE — Progress Notes (Signed)
Cardiology Office Note  Date: 04/08/2020   ID: KHA HARI, DOB [birth year], MRN [id number]  PCP:  Manon Hilding, MD  Cardiologist:  Kate Sable, MD Electrophysiologist:  None   Chief Complaint: Leg swelling  History of Present Illness: [name] is a 86 y.o. male with a history of atrial fibrillation, CAD, Crohn's disease, urinary retention.  Last seen by Dr. Bronson Ing 12/16/2019.  At a previous echocardiogram 10/22/2018 showing normal LV function with EF of 55% with mild left atrial dilatation.  Low risk stress test October 2018 demonstrated prior inferoseptal MI without ischemia.  He was planning a UroLift procedure at that time for urinary retention.  He denied any chest pain, palpitations, lower extremity edema or shortness of breath.  His atrial fibrillation was symptomatically stable on long-acting diltiazem 180 mg twice daily anticoagulated with warfarin.  Coronary artery disease was symptomatically stable continue carvedilol and simvastatin.  Blood pressure was within normal limits.  With no change in therapy.   He presents today for bilateral leg swelling.  Patient states he has been having some recent lower extremity edema over the past few months.  States he had some bowel surgery last year in [place] and has had some lower extremity edema since that time.  Also notes some increased dyspnea on exertion more than usual for him.  He denies any other recent issues other than he has problems with urinary retention and had a recent UroLift procedure which was not successful.  He has a pending TURP procedure at [facility].  History of atrial fibrillation.  He denies any recent palpitations or arrhythmias, racing heart or fluttering sensations in his chest.  Heart rate today is 71.  Blood pressure 138/80.  Patient states he recently saw Dr. Quintin Alto who stated the diltiazem may be the cause of the swelling.  Patient states he has been taking diltiazem for  significant length of time and had no swelling at all during most of the.  He has been taking the medication.  He denies any progressive anginal or exertional symptoms, palpitations or arrhythmias, orthostatic symptoms, PND, orthopnea.  No DVT or PE-like symptoms.   Past Medical History:  Diagnosis Date   Atrial fibrillation ([place])    Permanent   CAD (coronary artery disease)    Catheterization 2004, mild/moderate nonobstructive disease  /   nuclear, 2007, small inferior scar // no ischemia   Carotid bruit    Doppler, December 23, 2010, no significant carotid disease   Clot    LA, small, in past   Dizziness 03/2010   June, 2011   Mild, stop digoxin, felt better   Ejection fraction 02/2009   EF 55-60%,Echo, May, 2010   Elevated PSA    GERD (gastroesophageal reflux disease)    LVH (left ventricular hypertrophy) 02/2009    Mild, echo, 2010   Mitral regurgitation    Mild, echo, flat closure   Nausea vomiting and diarrhea 01/2010   Hospitalization, probably viral (or partial small bowel obstruction)/ Hospital 09/2010, recurrent possible partial small bowel obstruction, medical therapy   Other malaise and fatigue    Warfarin anticoagulation    Atrial fibrillation    Past Surgical History:  Procedure Laterality Date   AGILE CAPSULE  10/18/2011   Procedure: AGILE CAPSULE;  Surgeon: Rogene Houston, MD;  [facility];  Service: Endoscopy;  Laterality: N/A;  [place]  2004   CHOLECYSTECTOMY  2010   Dr. Anthony Sar  COLONOSCOPY  2008   [place]   COLONOSCOPY N/A 03/23/2016   Procedure: COLONOSCOPY;  Surgeon: Rogene Houston, MD;  Location: [facility];  Service: Endoscopy;  Laterality: N/A;  1:00   ESOPHAGOGASTRODUODENOSCOPY  11/08/2011   Procedure: ESOPHAGOGASTRODUODENOSCOPY (EGD);  Surgeon: Rogene Houston, MD;  Location: [facility];  Service: Endoscopy;  Laterality: N/A;  300   POLYPECTOMY  03/23/2016   Procedure: POLYPECTOMY;  Surgeon:  Rogene Houston, MD;  Location: [facility];  Service: Endoscopy;;  Cecal polyp removed via cold forceps recto-sigmoid polyp removed via cold snare    Current Outpatient Medications  Medication Sig Dispense Refill   acetaminophen (TYLENOL) 500 MG tablet Take 500 mg by mouth every 6 (six) hours as needed.     Bacillus Coagulans-Inulin (ALIGN PREBIOTIC-PROBIOTIC PO) Take by mouth daily.      carvedilol (COREG) 6.25 MG tablet TAKE ONE TABLET BY MOUTH TWICE DAILY. 60 tablet 6   diltiazem (CARDIZEM CD) 180 MG 24 hr capsule Take 180 mg by mouth 2 (two) times daily.      famotidine (PEPCID) 40 MG tablet Take 1 tablet (40 mg total) by mouth at bedtime. 90 tablet 3   fluticasone (FLONASE) 50 MCG/ACT nasal spray Place 2 sprays into both nostrils daily.     levothyroxine (SYNTHROID, LEVOTHROID) 75 MCG tablet Take 75 mcg by mouth daily before breakfast.     magnesium gluconate (MAGONATE) 500 MG tablet Take 500 mg by mouth 2 (two) times daily.      Meclizine HCl 25 MG CHEW TAKE 1/2 TO 1 TABLET BY MOUTH EVERY 6 TO 8 HOURS     Melatonin 10 MG TABS Take 10 mg by mouth at bedtime.      mesalamine (PENTASA) 500 MG CR capsule Take 500 mg by mouth 4 (four) times daily.     methenamine (HIPREX) 1 g tablet Take 1 g by mouth 2 (two) times daily.     nitroGLYCERIN (NITROSTAT) 0.4 MG SL tablet Place 0.4 mg under the tongue every 5 (five) minutes as needed. For chest pains. May repeat for up to 3 doses.     simvastatin (ZOCOR) 10 MG tablet Take 10 mg by mouth at bedtime.      tamsulosin (FLOMAX) 0.4 MG CAPS capsule Take 0.4 mg by mouth daily.     warfarin (COUMADIN) 2.5 MG tablet Take 2.5 mg by mouth as directed. As directed by coumadin clinic      No current facility-administered medications for this visit.   Allergies:  Patient has no known allergies.   Social History: The patient  reports that he has never smoked. He has never used smokeless tobacco. He reports that he does not drink alcohol  and does not use drugs.   Family History: The patient's family history includes Colon cancer in his mother; Healthy in his daughter; Heart disease in his father; Stroke in his brother.   ROS:  Please see the history of present illness. Otherwise, complete review of systems is positive for none.  All other systems are reviewed and negative.   Physical Exam: VS:  BP 138/80    Pulse 71    Ht 5' 8"  (1.727 m)    Wt 161 lb (73 kg)    SpO2 98%    BMI 24.48 kg/m , BMI Body mass index is 24.48 kg/m.  Wt Readings from Last 3 Encounters:  04/08/20 161 lb (73 kg)  03/23/20 154 lb (69.9 kg)  03/09/20 152 lb (68.9 kg)  General: Patient appears comfortable at rest. Neck: Supple, no elevated JVP or carotid bruits, no thyromegaly. Lungs: Clear to auscultation, nonlabored breathing at rest. Cardiac: Irregularly irregular rate and rhythm, no S3 or significant systolic murmur, no pericardial rub. Abdomen: Soft, nontender, no hepatomegaly, bowel sounds present, no guarding or rebound. Extremities: Mild pitting edema right greater than left, distal pulses 2+. Skin: Warm and dry. Musculoskeletal: No kyphosis. Neuropsychiatric: Alert and oriented x3, affect grossly appropriate.  ECG:    Recent Labwork: 09/25/2019: ALT 21; AST 16; BUN 15; Creat 0.95; Hemoglobin 12.2; Platelets 258; Potassium 4.3; Sodium 137  No results found for: CHOL, TRIG, HDL, CHOLHDL, VLDL, LDLCALC, LDLDIRECT  Other Studies Reviewed Today:  Echocardiogram 10/22/2018 Study Conclusions   - Left ventricle: The cavity size was normal. Wall thickness was  normal. Systolic function was normal. The estimated ejection  fraction was 55%. Wall motion was normal; there were no regional  wall motion abnormalities. The study was not technically  sufficient to allow evaluation of LV diastolic dysfunction due to  atrial fibrillation.  - Aortic valve: There was trivial regurgitation.  - Left atrium: The atrium was mildly  dilated.  Nuclear stress test 08/14/2017 Study Result  Narrative & Impression   There was no ST segment deviation noted during stress.  Findings consistent with prior inferoseptal myocardial infarction without current ischemia.  This is a low risk study.  The left ventricular ejection fraction is normal (55-65%).       Assessment and Plan:  1. Bilateral lower extremity edema   2. Permanent atrial fibrillation ([place])   3. Coronary artery disease of native artery of native heart with stable angina pectoris ([place])   4. Essential hypertension   5. DOE (dyspnea on exertion)    1. Bilateral lower extremity edema Patient complaining of lower extremity edema over the last 6 months.  Right greater than left.  Has mild 1+ mild pitting edema  right greater than left.  Patient states his PCP told him diltiazem may be the culprit for the swelling.  Patient states he has been taking diltiazem for a significant amount of time and never had any previous lower extremity edema.  2. Permanent atrial fibrillation ([facility]) Heart rate is 71 today with pulse irregularly irregular.  Patient denies any episodes of rapid heartbeats, palpitations, fluttering sensations.  Continue diltiazem 180 mg daily.  Continue Coumadin 2.5 mg as directed by Coumadin clinic.  3. Coronary artery disease of native artery of native heart with stable angina pectoris [facility]) Patient denies any progressive anginal symptoms.  Continue nitroglycerin sublingual as needed chest pain.  Continue simvastatin 10 mg daily.  4. Essential hypertension Blood pressure reasonably well controlled with a blood pressure today of 138/80.  Continue carvedilol 6.25 mg p.o. twice daily.  5.  DOE Patient complaining of increased dyspnea on exertion with exertional fatigue recently with associated lower extremity edema.  Please get a repeat echocardiogram to assess LV function and possible valvular disease  Medication Adjustments/Labs and Tests  Ordered: Current medicines are reviewed at length with the patient today.  Concerns regarding medicines are outlined above.    Disposition: Follow-up with Dr. Bronson Ing or APP 3 months  Signed, Levell July, NP 04/08/2020 4:25 PM    [facility] at [address], [place], [place] [address] Phone: [phone]; Fax: [phone]

## 2020-04-08 NOTE — Patient Instructions (Signed)
Your physician recommends that you schedule a follow-up appointment in: [place]  Your physician recommends that you continue on your current medications as directed. Please refer to the Current Medication list given to you today.  Your physician has requested that you have an echocardiogram. Echocardiography is a painless test that uses sound waves to create images of your heart. It provides your doctor with information about the size and shape of your heart and how well your hearts chambers and valves are working. This procedure takes approximately one hour. There are no restrictions for this procedure.  Thank you for choosing [facility]!!

## 2020-04-09 ENCOUNTER — Other Ambulatory Visit: Payer: Self-pay

## 2020-04-09 ENCOUNTER — Other Ambulatory Visit (HOSPITAL_COMMUNITY)
Admission: RE | Admit: 2020-04-09 | Discharge: 2020-04-09 | Disposition: A | Discharge status: Home / Self Care | Payer: PPO | Source: Other Acute Inpatient Hospital | Attending: Urology | Admitting: Urology

## 2020-04-09 DIAGNOSIS — R339 Retention of urine, unspecified: Secondary | ICD-10-CM | POA: Insufficient documentation

## 2020-04-09 LAB — URINALYSIS, ROUTINE W REFLEX MICROSCOPIC
Bilirubin Urine: NEGATIVE
Glucose, UA: NEGATIVE mg/dL
Ketones, ur: NEGATIVE mg/dL
Nitrite: NEGATIVE
Protein, ur: NEGATIVE mg/dL
Specific Gravity, Urine: 1.012 (ref 1.005–1.030)
WBC, UA: >50 WBC/hpf — ABNORMAL HIGH (ref 0–5)
pH: 6 (ref 5.0–8.0)

## 2020-04-11 LAB — URINE CULTURE: Culture: 30000 — AB

## 2020-04-14 DIAGNOSIS — I129 Hypertensive chronic kidney disease with stage 1 through stage 4 chronic kidney disease, or unspecified chronic kidney disease: Secondary | ICD-10-CM | POA: Diagnosis not present

## 2020-04-14 DIAGNOSIS — I482 Chronic atrial fibrillation, unspecified: Secondary | ICD-10-CM | POA: Diagnosis not present

## 2020-04-14 DIAGNOSIS — N183 Chronic kidney disease, stage 3 unspecified: Secondary | ICD-10-CM | POA: Diagnosis not present

## 2020-04-14 DIAGNOSIS — E7849 Other hyperlipidemia: Secondary | ICD-10-CM | POA: Diagnosis not present

## 2020-04-14 DIAGNOSIS — I2510 Atherosclerotic heart disease of native coronary artery without angina pectoris: Secondary | ICD-10-CM | POA: Diagnosis not present

## 2020-04-15 ENCOUNTER — Telehealth: Payer: Self-pay | Admitting: Urology

## 2020-04-15 ENCOUNTER — Encounter (HOSPITAL_BASED_OUTPATIENT_CLINIC_OR_DEPARTMENT_OTHER): Payer: Self-pay | Admitting: Urology

## 2020-04-15 ENCOUNTER — Other Ambulatory Visit: Payer: Self-pay

## 2020-04-15 NOTE — Progress Notes (Signed)
Spoke with: [name] and [name] NPO:  No food after midnight/Clear liquids until 5:00 AM DOS Arrival time: 6:00 AM Lab needs dos----  ISTAT8, PT/ PTT            Lab results------N/A COVID test ------No fully vaccinated on 12/12/2019 , patient instructed to bring card Medications to take morning of surgery: Carvedilol, Diltiazem, Meslasmine, Levothyroxine Pre op orders in epic Yes Diabetic medication -----N/A Patient Special Instructions -----N/A Pre-Op special Istructions -----Last dose of Warfarin will be 04/16/2020 per patient Ride home: [name] (wife) [phone]  Patient verbalized understanding of instructions that were given at this phone interview. Patient denies shortness of breath, chest pain, fever, cough a this phone interview.

## 2020-04-15 NOTE — Telephone Encounter (Signed)
Patient called and Lm stating he needs a return call from the nurse. He has questions about upcoming procedure.

## 2020-04-15 NOTE — Telephone Encounter (Signed)
Received number from [facility] for pt to call regarding pre op appt. [phone] number given to pt and pt will call. Pt voiced understanding.

## 2020-04-15 NOTE — Progress Notes (Signed)
NEW Covid Policy July 0100  Surgery Day:  04/22/2020  Facility:  [facility]  Type of Surgery: TURP  Fully Covid Vaccinated:   1) 11/06/2019                                          2) 12/12/2019                                          Where? [facility] Department                                           Type? Moderna  Not Covid Vaccinated:  N/A  Do you have symptoms? No  In the past 14 days:        Have you had any symptoms? NO       Have you been tested covid positive? NO       Have you been in contact with someone covid positive?No         Is pt Immuno-compromised? NO

## 2020-04-15 NOTE — Telephone Encounter (Signed)
Pt calling to ask about hospital pre op appt and covid test. I can't find any appts in Epic. Reached out to [facility], Dr. Diona Fanti surgery scheduler. Waiting to hear back.

## 2020-04-20 ENCOUNTER — Other Ambulatory Visit: Payer: Self-pay | Admitting: Urology

## 2020-04-20 DIAGNOSIS — N401 Enlarged prostate with lower urinary tract symptoms: Secondary | ICD-10-CM

## 2020-04-20 MED ORDER — SULFAMETHOXAZOLE-TRIMETHOPRIM 800-160 MG PO TABS: 1.0000 | ORAL_TABLET | Freq: Two times a day (BID) | ORAL | 0 refills | Status: DC

## 2020-04-21 ENCOUNTER — Other Ambulatory Visit: Payer: Self-pay | Admitting: Urology

## 2020-04-21 ENCOUNTER — Telehealth: Payer: Self-pay

## 2020-04-21 ENCOUNTER — Telehealth: Payer: Self-pay | Admitting: Urology

## 2020-04-21 NOTE — Telephone Encounter (Signed)
Patient requests Estill Bamberg call him back regarding medication issue.

## 2020-04-21 NOTE — Telephone Encounter (Signed)
Spoke with pt. Pharmacy will not dispense bactrim d/t pt taking warfarin. Bactrim is pre op med and warfarin has been held for 5 days pre op. Also methathemine has an interaction as well. Placed a call to Dr. Diona Fanti to inform of pharmacy concerns. Waiting call back.

## 2020-04-21 NOTE — Telephone Encounter (Signed)
-----   Message from Franchot Gallo, MD sent at 04/20/2020  5:27 PM EDT ----- Notify patient the culture was positive.  I sent in sulfa for him to take for 7 days.  It is okay to stop his Coumadin now. ----- Message ----- From: Dorisann Frames, RN Sent: 04/12/2020  12:12 PM EDT To: Franchot Gallo, MD  Urine and Culture

## 2020-04-21 NOTE — Telephone Encounter (Signed)
Pt called and informed to not go to hospital in am. due to surgery being cancelled. Pt. voiced understanding.

## 2020-04-21 NOTE — Telephone Encounter (Signed)
Pt notified of medication sent to pharmacy. Pt has stopped coumadin as well.

## 2020-04-22 NOTE — Telephone Encounter (Signed)
I spoke with Dr. Diona Fanti yesterday evening concerning pt not receiving antibiotics not being dispensed to patient for pre op, per Dr. Diona Fanti surgery would have to be post pone due to pt having positive urine culture. Pt was called yesterday but Hope Cobb, LPN. I called patient this am to follow up. Per pt Dr. Diona Fanti had reached out to patient as well and pt rescheduled for procedure for next Thursday and was able to pick up prescription after Dr. Diona Fanti called the pharmacy to request release of medication.

## 2020-04-27 ENCOUNTER — Telehealth: Payer: Self-pay | Admitting: Urology

## 2020-04-27 NOTE — Telephone Encounter (Signed)
Pt came and dropped urine in a urinal for specimen. Urine cups given to pt to bring back for sterile specimen.

## 2020-04-27 NOTE — Telephone Encounter (Signed)
Pt called and request a return call.

## 2020-04-28 ENCOUNTER — Other Ambulatory Visit (HOSPITAL_COMMUNITY)
Admission: AD | Admit: 2020-04-28 | Discharge: 2020-04-28 | Disposition: A | Discharge status: Home / Self Care | Payer: PPO | Source: Skilled Nursing Facility | Attending: Urology | Admitting: Urology

## 2020-04-28 ENCOUNTER — Ambulatory Visit (INDEPENDENT_AMBULATORY_CARE_PROVIDER_SITE_OTHER): Payer: PPO

## 2020-04-28 ENCOUNTER — Other Ambulatory Visit: Payer: Self-pay

## 2020-04-28 ENCOUNTER — Other Ambulatory Visit: Payer: Self-pay | Admitting: Urology

## 2020-04-28 DIAGNOSIS — N3020 Other chronic cystitis without hematuria: Secondary | ICD-10-CM | POA: Insufficient documentation

## 2020-04-28 DIAGNOSIS — N401 Enlarged prostate with lower urinary tract symptoms: Secondary | ICD-10-CM

## 2020-04-28 DIAGNOSIS — N138 Other obstructive and reflux uropathy: Secondary | ICD-10-CM

## 2020-04-28 DIAGNOSIS — R338 Other retention of urine: Secondary | ICD-10-CM

## 2020-04-28 LAB — URINALYSIS, COMPLETE (UACMP) WITH MICROSCOPIC
Bilirubin Urine: NEGATIVE
Glucose, UA: NEGATIVE mg/dL
Ketones, ur: NEGATIVE mg/dL
Nitrite: NEGATIVE
Protein, ur: 30 mg/dL — AB
Specific Gravity, Urine: 1.011 (ref 1.005–1.030)
WBC, UA: >50 WBC/hpf — ABNORMAL HIGH (ref 0–5)
pH: 5 (ref 5.0–8.0)

## 2020-04-28 MED ORDER — FLUCONAZOLE 100 MG PO TABS: 100.0000 mg | ORAL_TABLET | Freq: Every day | ORAL | 0 refills | Status: DC

## 2020-04-28 NOTE — Progress Notes (Signed)
Patient called and verified arrival time of 0530.  Patient plans to arrive at that time.

## 2020-04-28 NOTE — Progress Notes (Signed)
Pt. dropped off urine and urine sent to lab

## 2020-04-29 ENCOUNTER — Other Ambulatory Visit: Payer: Self-pay

## 2020-04-29 ENCOUNTER — Encounter (HOSPITAL_BASED_OUTPATIENT_CLINIC_OR_DEPARTMENT_OTHER)
Admission: RE | Disposition: A | Discharge status: Home / Self Care | Payer: Self-pay | Source: Home / Self Care | Attending: Urology

## 2020-04-29 ENCOUNTER — Ambulatory Visit (HOSPITAL_BASED_OUTPATIENT_CLINIC_OR_DEPARTMENT_OTHER): Payer: PPO | Admitting: Certified Registered"

## 2020-04-29 ENCOUNTER — Encounter (HOSPITAL_BASED_OUTPATIENT_CLINIC_OR_DEPARTMENT_OTHER): Payer: Self-pay | Admitting: Urology

## 2020-04-29 ENCOUNTER — Ambulatory Visit (HOSPITAL_BASED_OUTPATIENT_CLINIC_OR_DEPARTMENT_OTHER)
Admission: RE | Admit: 2020-04-29 | Discharge: 2020-04-30 | Disposition: A | Discharge status: Home / Self Care | Payer: PPO | Attending: Urology | Admitting: Urology

## 2020-04-29 DIAGNOSIS — I4891 Unspecified atrial fibrillation: Secondary | ICD-10-CM | POA: Diagnosis not present

## 2020-04-29 DIAGNOSIS — E039 Hypothyroidism, unspecified: Secondary | ICD-10-CM | POA: Insufficient documentation

## 2020-04-29 DIAGNOSIS — K219 Gastro-esophageal reflux disease without esophagitis: Secondary | ICD-10-CM | POA: Insufficient documentation

## 2020-04-29 DIAGNOSIS — R0602 Shortness of breath: Secondary | ICD-10-CM | POA: Diagnosis not present

## 2020-04-29 DIAGNOSIS — R338 Other retention of urine: Secondary | ICD-10-CM | POA: Diagnosis not present

## 2020-04-29 DIAGNOSIS — N320 Bladder-neck obstruction: Secondary | ICD-10-CM | POA: Diagnosis not present

## 2020-04-29 DIAGNOSIS — Z79899 Other long term (current) drug therapy: Secondary | ICD-10-CM | POA: Insufficient documentation

## 2020-04-29 DIAGNOSIS — Z87442 Personal history of urinary calculi: Secondary | ICD-10-CM | POA: Diagnosis not present

## 2020-04-29 DIAGNOSIS — N401 Enlarged prostate with lower urinary tract symptoms: Secondary | ICD-10-CM | POA: Diagnosis not present

## 2020-04-29 DIAGNOSIS — Z7901 Long term (current) use of anticoagulants: Secondary | ICD-10-CM | POA: Insufficient documentation

## 2020-04-29 DIAGNOSIS — I2510 Atherosclerotic heart disease of native coronary artery without angina pectoris: Secondary | ICD-10-CM | POA: Insufficient documentation

## 2020-04-29 DIAGNOSIS — N400 Enlarged prostate without lower urinary tract symptoms: Secondary | ICD-10-CM | POA: Diagnosis not present

## 2020-04-29 DIAGNOSIS — N138 Other obstructive and reflux uropathy: Secondary | ICD-10-CM | POA: Diagnosis not present

## 2020-04-29 DIAGNOSIS — K5090 Crohn's disease, unspecified, without complications: Secondary | ICD-10-CM | POA: Diagnosis not present

## 2020-04-29 DIAGNOSIS — Z79890 Hormone replacement therapy (postmenopausal): Secondary | ICD-10-CM | POA: Diagnosis not present

## 2020-04-29 HISTORY — DX: Benign prostatic hyperplasia without lower urinary tract symptoms: N40.0

## 2020-04-29 HISTORY — DX: Presence of spectacles and contact lenses: Z97.3

## 2020-04-29 HISTORY — DX: Pneumonia, unspecified organism: J18.9

## 2020-04-29 HISTORY — DX: Crohn's disease, unspecified, without complications: K50.90

## 2020-04-29 HISTORY — DX: Anemia, unspecified: D64.9

## 2020-04-29 HISTORY — DX: Personal history of urinary calculi: Z87.442

## 2020-04-29 HISTORY — PX: TRANSURETHRAL RESECTION OF PROSTATE: SHX73

## 2020-04-29 HISTORY — DX: Hypothyroidism, unspecified: E03.9

## 2020-04-29 HISTORY — DX: Unspecified intestinal obstruction, unspecified as to partial versus complete obstruction: K56.609

## 2020-04-29 HISTORY — DX: Retention of urine, unspecified: R33.9

## 2020-04-29 LAB — POCT I-STAT, CHEM 8
BUN: 22 mg/dL (ref 8–23)
Calcium, Ion: 1.23 mmol/L (ref 1.15–1.40)
Chloride: 105 mmol/L (ref 98–111)
Creatinine, Ser: 1.7 mg/dL — ABNORMAL HIGH (ref 0.61–1.24)
Glucose, Bld: 95 mg/dL (ref 70–99)
HCT: 39 % (ref 39.0–52.0)
Hemoglobin: 13.3 g/dL (ref 13.0–17.0)
Potassium: 4.2 mmol/L (ref 3.5–5.1)
Sodium: 141 mmol/L (ref 135–145)
TCO2: 23 mmol/L (ref 22–32)

## 2020-04-29 LAB — PROTIME-INR
INR: 1.3 — ABNORMAL HIGH (ref 0.8–1.2)
Prothrombin Time: 15.6 seconds — ABNORMAL HIGH (ref 11.4–15.2)

## 2020-04-29 LAB — APTT: aPTT: 35 seconds (ref 24–36)

## 2020-04-29 SURGERY — TURP (TRANSURETHRAL RESECTION OF PROSTATE)
Anesthesia: General | Site: Prostate

## 2020-04-29 MED ORDER — MIDAZOLAM HCL 2 MG/2ML IJ SOLN
INTRAMUSCULAR | Status: AC
  Filled 2020-04-29: qty 2

## 2020-04-29 MED ORDER — FLUTICASONE PROPIONATE 50 MCG/ACT NA SUSP
2.0000 | Freq: Every day | NASAL | Status: DC
  Filled 2020-04-29: qty 16

## 2020-04-29 MED ORDER — SODIUM CHLORIDE 0.9 % IR SOLN
Status: DC | PRN
  Administered 2020-04-29 (×3): 6000 mL via INTRAVESICAL

## 2020-04-29 MED ORDER — LIDOCAINE 2% (20 MG/ML) 5 ML SYRINGE
INTRAMUSCULAR | Status: DC | PRN
  Administered 2020-04-29: 100 mg via INTRAVENOUS

## 2020-04-29 MED ORDER — ACETAMINOPHEN 325 MG PO TABS: 650.0000 mg | ORAL_TABLET | ORAL | Status: DC | PRN

## 2020-04-29 MED ORDER — SODIUM CHLORIDE 0.9 % IR SOLN: 3000.0000 mL | Status: DC

## 2020-04-29 MED ORDER — FENTANYL CITRATE (PF) 100 MCG/2ML IJ SOLN
INTRAMUSCULAR | Status: AC
  Filled 2020-04-29: qty 2

## 2020-04-29 MED ORDER — GENTAMICIN SULFATE 40 MG/ML IJ SOLN
5.0000 mg/kg | Freq: Once | INTRAVENOUS | Status: AC
  Administered 2020-04-29: 340 mg via INTRAVENOUS
  Filled 2020-04-29: qty 8.5

## 2020-04-29 MED ORDER — LIDOCAINE 2% (20 MG/ML) 5 ML SYRINGE
INTRAMUSCULAR | Status: AC
  Filled 2020-04-29: qty 5

## 2020-04-29 MED ORDER — PROPOFOL 10 MG/ML IV BOLUS
INTRAVENOUS | Status: AC
  Filled 2020-04-29: qty 20

## 2020-04-29 MED ORDER — FENTANYL CITRATE (PF) 250 MCG/5ML IJ SOLN
INTRAMUSCULAR | Status: DC | PRN
  Administered 2020-04-29 (×2): 25 ug via INTRAVENOUS

## 2020-04-29 MED ORDER — ACETAMINOPHEN 500 MG PO TABS: 500.0000 mg | ORAL_TABLET | Freq: Four times a day (QID) | ORAL | Status: DC | PRN

## 2020-04-29 MED ORDER — FLUCONAZOLE 100 MG PO TABS
100.0000 mg | ORAL_TABLET | Freq: Every day | ORAL | Status: DC
  Administered 2020-04-29: 100 mg via ORAL
  Filled 2020-04-29: qty 1

## 2020-04-29 MED ORDER — LACTATED RINGERS IV SOLN: INTRAVENOUS | Status: DC

## 2020-04-29 MED ORDER — CEFAZOLIN SODIUM-DEXTROSE 2-4 GM/100ML-% IV SOLN
2.0000 g | INTRAVENOUS | Status: AC
  Administered 2020-04-29: 2 g via INTRAVENOUS

## 2020-04-29 MED ORDER — ONDANSETRON HCL 4 MG/2ML IJ SOLN: 4.0000 mg | Freq: Once | INTRAMUSCULAR | Status: DC | PRN

## 2020-04-29 MED ORDER — FAMOTIDINE 20 MG PO TABS
40.0000 mg | ORAL_TABLET | Freq: Every day | ORAL | Status: DC
  Administered 2020-04-29: 40 mg via ORAL

## 2020-04-29 MED ORDER — ONDANSETRON HCL 4 MG/2ML IJ SOLN: 4.0000 mg | INTRAMUSCULAR | Status: DC | PRN

## 2020-04-29 MED ORDER — HYDROCODONE-ACETAMINOPHEN 5-325 MG PO TABS: 1.0000 | ORAL_TABLET | ORAL | Status: DC | PRN

## 2020-04-29 MED ORDER — CEFAZOLIN SODIUM-DEXTROSE 2-4 GM/100ML-% IV SOLN
INTRAVENOUS | Status: AC
  Filled 2020-04-29: qty 100

## 2020-04-29 MED ORDER — FAMOTIDINE 20 MG PO TABS
ORAL_TABLET | ORAL | Status: AC
  Filled 2020-04-29: qty 2

## 2020-04-29 MED ORDER — DILTIAZEM HCL ER COATED BEADS 180 MG PO CP24
180.0000 mg | ORAL_CAPSULE | Freq: Two times a day (BID) | ORAL | Status: DC
  Administered 2020-04-29 – 2020-04-30 (×2): 180 mg via ORAL
  Filled 2020-04-29: qty 1

## 2020-04-29 MED ORDER — METHENAMINE MANDELATE 0.5 G PO TABS
1.0000 g | ORAL_TABLET | Freq: Two times a day (BID) | ORAL | Status: DC
  Administered 2020-04-29: 1 g via ORAL
  Filled 2020-04-29: qty 2

## 2020-04-29 MED ORDER — EPHEDRINE SULFATE-NACL 50-0.9 MG/10ML-% IV SOSY
PREFILLED_SYRINGE | INTRAVENOUS | Status: DC | PRN
  Administered 2020-04-29 (×2): 10 mg via INTRAVENOUS

## 2020-04-29 MED ORDER — HYDROCODONE-ACETAMINOPHEN 7.5-325 MG PO TABS: 1.0000 | ORAL_TABLET | Freq: Once | ORAL | Status: DC | PRN

## 2020-04-29 MED ORDER — BELLADONNA ALKALOIDS-OPIUM 16.2-60 MG RE SUPP: 1.0000 | Freq: Four times a day (QID) | RECTAL | Status: DC | PRN

## 2020-04-29 MED ORDER — DEXAMETHASONE SODIUM PHOSPHATE 10 MG/ML IJ SOLN
INTRAMUSCULAR | Status: AC
  Filled 2020-04-29: qty 1

## 2020-04-29 MED ORDER — FENTANYL CITRATE (PF) 100 MCG/2ML IJ SOLN: 25.0000 ug | INTRAMUSCULAR | Status: DC | PRN

## 2020-04-29 MED ORDER — CARVEDILOL 6.25 MG PO TABS
6.2500 mg | ORAL_TABLET | Freq: Two times a day (BID) | ORAL | Status: DC
  Administered 2020-04-29 – 2020-04-30 (×2): 6.25 mg via ORAL
  Filled 2020-04-29: qty 1

## 2020-04-29 MED ORDER — ZOLPIDEM TARTRATE 5 MG PO TABS: 5.0000 mg | ORAL_TABLET | Freq: Every evening | ORAL | Status: DC | PRN

## 2020-04-29 MED ORDER — LEVOTHYROXINE SODIUM 75 MCG PO TABS
75.0000 ug | ORAL_TABLET | Freq: Every day | ORAL | Status: DC
  Administered 2020-04-30 (×2): 75 ug via ORAL
  Filled 2020-04-29: qty 1

## 2020-04-29 MED ORDER — PROPOFOL 10 MG/ML IV BOLUS
INTRAVENOUS | Status: DC | PRN
  Administered 2020-04-29: 120 mg via INTRAVENOUS

## 2020-04-29 MED ORDER — ONDANSETRON HCL 4 MG/2ML IJ SOLN
INTRAMUSCULAR | Status: DC | PRN
  Administered 2020-04-29: 4 mg via INTRAVENOUS

## 2020-04-29 MED ORDER — PHENYLEPHRINE 40 MCG/ML (10ML) SYRINGE FOR IV PUSH (FOR BLOOD PRESSURE SUPPORT)
PREFILLED_SYRINGE | INTRAVENOUS | Status: DC | PRN
  Administered 2020-04-29 (×2): 80 ug via INTRAVENOUS

## 2020-04-29 MED ORDER — SENNA 8.6 MG PO TABS
1.0000 | ORAL_TABLET | Freq: Two times a day (BID) | ORAL | Status: DC
  Administered 2020-04-29: 8.6 mg via ORAL

## 2020-04-29 MED ORDER — SENNA 8.6 MG PO TABS
ORAL_TABLET | ORAL | Status: AC
  Filled 2020-04-29: qty 1

## 2020-04-29 MED ORDER — DEXAMETHASONE SODIUM PHOSPHATE 10 MG/ML IJ SOLN
INTRAMUSCULAR | Status: DC | PRN
  Administered 2020-04-29: 5 mg via INTRAVENOUS

## 2020-04-29 MED ORDER — SODIUM CHLORIDE 0.45 % IV SOLN: INTRAVENOUS | Status: DC

## 2020-04-29 MED ORDER — NITROGLYCERIN 0.4 MG SL SUBL: 0.4000 mg | SUBLINGUAL_TABLET | SUBLINGUAL | Status: DC | PRN

## 2020-04-29 SURGICAL SUPPLY — 35 items
BAG DRAIN URO-CYSTO SKYTR STRL (DRAIN) ×3 IMPLANT
BAG DRN RND TRDRP ANRFLXCHMBR (UROLOGICAL SUPPLIES) ×1
BAG DRN UROCATH (DRAIN) ×1
BAG URINE DRAIN 2000ML AR STRL (UROLOGICAL SUPPLIES) ×3 IMPLANT
BAG URINE LEG 500ML (DRAIN) IMPLANT
CATH FOLEY 2WAY SLVR  5CC 20FR (CATHETERS)
CATH FOLEY 2WAY SLVR  5CC 22FR (CATHETERS)
CATH FOLEY 2WAY SLVR 30CC 22FR (CATHETERS) IMPLANT
CATH FOLEY 2WAY SLVR 5CC 20FR (CATHETERS) IMPLANT
CATH FOLEY 2WAY SLVR 5CC 22FR (CATHETERS) IMPLANT
CATH FOLEY 3WAY 30CC 22F (CATHETERS) IMPLANT
CATH HEMA 3WAY 30CC 22FR COUDE (CATHETERS) ×2 IMPLANT
CATH HEMA 3WAY 30CC 24FR COUDE (CATHETERS) IMPLANT
CATH HEMA 3WAY 30CC 24FR RND (CATHETERS) IMPLANT
CLOTH BEACON ORANGE TIMEOUT ST (SAFETY) ×3 IMPLANT
ELECT REM PT RETURN 9FT ADLT (ELECTROSURGICAL)
ELECTRODE REM PT RTRN 9FT ADLT (ELECTROSURGICAL) ×1 IMPLANT
EVACUATOR MICROVAS BLADDER (UROLOGICAL SUPPLIES) ×2 IMPLANT
GLOVE BIO SURGEON STRL SZ8 (GLOVE) ×3 IMPLANT
GOWN STRL REUS W/TWL XL LVL3 (GOWN DISPOSABLE) ×3 IMPLANT
HOLDER FOLEY CATH W/STRAP (MISCELLANEOUS) ×2 IMPLANT
IV NS IRRIG 3000ML ARTHROMATIC (IV SOLUTION) ×14 IMPLANT
KIT TURNOVER CYSTO (KITS) ×3 IMPLANT
LOOP CUT BIPOLAR 24F LRG (ELECTROSURGICAL) ×5 IMPLANT
MANIFOLD NEPTUNE II (INSTRUMENTS) ×3 IMPLANT
NS IRRIG 500ML POUR BTL (IV SOLUTION) ×4 IMPLANT
PACK CYSTO (CUSTOM PROCEDURE TRAY) ×3 IMPLANT
PLUG CATH AND CAP STER (CATHETERS) ×2 IMPLANT
SYR 30ML LL (SYRINGE) ×2 IMPLANT
SYR TOOMEY IRRIG 70ML (MISCELLANEOUS)
SYRINGE TOOMEY IRRIG 70ML (MISCELLANEOUS) IMPLANT
TUBE CONNECTING 12'X1/4 (SUCTIONS) ×1
TUBE CONNECTING 12X1/4 (SUCTIONS) ×2 IMPLANT
TUBING UROLOGY SET (TUBING) ×2 IMPLANT
WATER STERILE IRR 500ML POUR (IV SOLUTION) ×2 IMPLANT

## 2020-04-29 NOTE — Progress Notes (Signed)
Pharmacy Antibiotic Note  [name] is a 85 y.o. male admitted on 04/29/2020 with surgical prophylaxis.  Pharmacy has been consulted for gentamicin dosing given recent history UTIs. Note previous cultures all with E coli (sensitive to gent) with identical susceptibility pattern, so likely same strain.  Plan:  Gentamicin 5 mg/kg IV x1  SCr elevated d/t BOO from BPH, borderline for extended interval dosing, but expect this to resolve after TURP  F/u with Urology if extended duration warranted  Height: 5' 8"  (172.7 cm) Weight: 70.9 kg (156 lb 6.4 oz) IBW/kg (Calculated) : 68.4  Temp (24hrs), Avg:97.5 F (36.4 C), Min:97.1 F (36.2 C), Max:97.9 F (36.6 C)  Recent Labs  Lab 04/29/20 0629  CREATININE 1.70*    Estimated Creatinine Clearance: 29.6 mL/min (A) (by C-G formula based on SCr of 1.7 mg/dL (H)).    No Known Allergies    Thank you for allowing pharmacy to be a part of this patient's care.  Brent Taillon A 04/29/2020 10:27 AM

## 2020-04-29 NOTE — Op Note (Signed)
Preoperative diagnosis: 1. Bladder outlet obstruction secondary to BPH, history of UroLift  Postoperative diagnosis:  1. Bladder outlet obstruction secondary to BPH, history of UroLift  Procedure:  1. Cystoscopy 2. Transurethral resection of the prostate  Surgeon: Lillette Boxer. Redell Nazir, M.D.  Anesthesia: General  Complications: None  Drain: 32 [place] three-way hematuria catheter  EBL: Minimal  Specimens: 1. Prostate chips  Disposition of specimens: Pathology  Indication: DARROLD BEZEK is a patient with bladder outlet obstruction secondary to benign prostatic hyperplasia.  Earlier this year he underwent UroLift procedure for urinary retention.  Unfortunately, although he does not require catheter anymore, he has significant difficulty with obstructive symptoms.  After reviewing the management options for treatment, he elected to proceed with the above surgical procedure(s). We have discussed the potential benefits and risks of the procedure, side effects of the proposed treatment, the likelihood of the patient achieving the goals of the procedure, and any potential problems that might occur during the procedure or recuperation. Informed consent has been obtained.  Description of procedure:  The patient was identified in the holding area. He received preoperative antibiotics. He was then taken to the operating room. General anesthetic was administered.  The patient was then placed in the dorsal lithotomy position, prepped and draped in the usual sterile fashion. Timeout was then performed.  A resectoscope sheath was placed using the visual obturator.  This was somewhat difficult as he does have significant phimosis.  The bladder was then systematically examined in its entirety. There was no evidence of  tumors, stones, or other mucosal pathology.he did have significant trabeculations.  The resectoscope, loop and telescope were then placed.  The ureteral orifices were identified so as  to be avoided during the procedure.  The prostate adenoma was then resected utilizing loop cautery resection with the bipolar cutting loop.  The prostate adenoma from the bladder neck back to the verumontanum was resected beginning at the six o'clock position and then extended to include the right and left lobes of the prostate and anterior prostate, respectively. Care was taken not to resect distal to the verumontanum.  There were UroLift staples evident during the resection, these were removed from the bladder with irrigation.  Hemostasis was then achieved with the cautery and the bladder was emptied and reinspected with no significant bleeding noted at the end of the procedure.  Resected chips were irrigated from the bladder with the evacuator and sent to pathology.  A 3 way catheter was then placed into the bladder and placed on continuous bladder irrigation.  The patient appeared to tolerate the procedure well and without complications. The patient was able to be awakened and transferred to the recovery unit in satisfactory condition. He tolerated the procedure well.

## 2020-04-29 NOTE — Anesthesia Postprocedure Evaluation (Signed)
Anesthesia Post Note  Patient: [name]  Procedure(s) Performed: TRANSURETHRAL RESECTION OF THE PROSTATE (TURP) (N/A Prostate)     Patient location during evaluation: PACU Anesthesia Type: General Level of consciousness: sedated and patient cooperative Pain management: pain level controlled Vital Signs Assessment: post-procedure vital signs reviewed and stable Respiratory status: spontaneous breathing Cardiovascular status: stable Anesthetic complications: no   No complications documented.  Last Vitals:  Vitals:   04/29/20 0945 04/29/20 1005  BP: 113/65 131/78  Pulse: (!) 54 66  Resp: 17 16  Temp: (!) 36.2 C (!) 36.4 C  SpO2: 97% 99%    Last Pain:  Vitals:   04/29/20 1005  TempSrc:   PainSc: 0-No pain                 Nolon Nations

## 2020-04-29 NOTE — Transfer of Care (Signed)
Immediate Anesthesia Transfer of Care Note  Patient: KERMIT ARNETTE  Procedure(s) Performed: TRANSURETHRAL RESECTION OF THE PROSTATE (TURP) (N/A Prostate)  Patient Location: PACU  Anesthesia Type:General  Level of Consciousness: drowsy and patient cooperative  Airway & Oxygen Therapy: Patient Spontanous Breathing and Patient connected to face mask oxygen  Post-op Assessment: Report given to RN, Post -op Vital signs reviewed and stable and Patient moving all extremities  Post vital signs: Reviewed and stable  Last Vitals:  Vitals Value Taken Time  BP 111/67 04/29/20 0850  Temp    Pulse 76 04/29/20 0853  Resp 17 04/29/20 0853  SpO2 98 % 04/29/20 0853  Vitals shown include unvalidated device data.  Last Pain:  Vitals:   04/29/20 0547  TempSrc: Oral  PainSc: 0-No pain      Patients Stated Pain Goal: 4 (62/86/38 1771)  Complications: No complications documented.

## 2020-04-29 NOTE — Anesthesia Procedure Notes (Signed)
Procedure Name: LMA Insertion Date/Time: 04/29/2020 7:42 AM Performed by: Myna Bright, CRNA Pre-anesthesia Checklist: Patient identified, Emergency Drugs available, Patient being monitored and Suction available Patient Re-evaluated:Patient Re-evaluated prior to induction Oxygen Delivery Method: Circle system utilized Preoxygenation: Pre-oxygenation with 100% oxygen Induction Type: IV induction Ventilation: Mask ventilation without difficulty LMA: LMA inserted LMA Size: 4.0 Number of attempts: 1 Placement Confirmation: positive ETCO2 and breath sounds checked- equal and bilateral Tube secured with: Tape Dental Injury: Teeth and Oropharynx as per pre-operative assessment

## 2020-04-29 NOTE — H&P (Signed)
H&P  Chief Complaint: Urinary obstruction  History of Present Illness: 86 year old male presents at this time for TURP.  He underwent UroLift procedure for urinary retention secondary to BPH several months ago.  Unfortunately, this did not totally relieve his urinary symptomatology and he still carries a fair residual.  We have treated him for recurrent urinary tract infections, he has been on oral antibiotics recently and was actually started on fluconazole yesterday for a small amount of yeast in his urine.  Because of his inadequate emptying, significant urinary symptomatology and the fact that we have had to cancel this case several times, I have decided to proceed with TURP today.  The patient is aware of the procedure as well as risks and complications.  Past Medical History:  Diagnosis Date  . Anemia   . Atrial fibrillation ([place])    Permanent  . BPH (benign prostatic hyperplasia)   . CAD (coronary artery disease)    Catheterization 2004, mild/moderate nonobstructive disease  /   nuclear, 2007, small inferior scar // no ischemia  . Carotid bruit    Doppler, December 23, 2010, no significant carotid disease  . Clot    LA, small, in past, pt unaware  . Crohn's disease ([place])   . Dizziness 03/2010   June, 2011   Mild, stop digoxin, felt better  . Ejection fraction 02/2009   EF 55-60%,Echo, May, 2010  . Elevated PSA   . GERD (gastroesophageal reflux disease)   . History of kidney stones    remote  . Hypothyroidism   . LVH (left ventricular hypertrophy) 02/2009    Mild, echo, 2010  . Mitral regurgitation    Mild, echo, flat closure  . Nausea vomiting and diarrhea 01/2010   Hospitalization, probably viral (or partial small bowel obstruction)/ Hospital 09/2010, recurrent possible partial small bowel obstruction, medical therapy  . Other malaise and fatigue   . Pneumonia    teenager  . SBO (small bowel obstruction) ([place])   . Urinary retention   . Warfarin anticoagulation    Atrial  fibrillation  . Wears glasses     Past Surgical History:  Procedure Laterality Date  . AGILE CAPSULE  10/18/2011   Procedure: AGILE CAPSULE;  Surgeon: Rogene Houston, MD;  Location: [facility];  Service: Endoscopy;  Laterality: N/A;  730  . CARDIAC CATHETERIZATION  2004  . CHOLECYSTECTOMY  2010   Dr. Anthony Sar  . COLONOSCOPY  2008   [facility]  . COLONOSCOPY N/A 03/23/2016   Procedure: COLONOSCOPY;  Surgeon: Rogene Houston, MD;  Location: [facility];  Service: Endoscopy;  Laterality: N/A;  1:00  . CYSTOSCOPY WITH INSERTION OF UROLIFT    . ESOPHAGOGASTRODUODENOSCOPY  11/08/2011   Procedure: ESOPHAGOGASTRODUODENOSCOPY (EGD);  Surgeon: Rogene Houston, MD;  Location: [facility];  Service: Endoscopy;  Laterality: N/A;  300  . POLYPECTOMY  03/23/2016   Procedure: POLYPECTOMY;  Surgeon: Rogene Houston, MD;  Location: [facility];  Service: Endoscopy;;  Cecal polyp removed via cold forceps recto-sigmoid polyp removed via cold snare    Home Medications:    Allergies: No Known Allergies  Family History  Problem Relation Age of Onset  . Colon cancer Mother   . Heart disease Father   . Stroke Brother   . Healthy Daughter     Social History:  reports that he has never smoked. He has never used smokeless tobacco. He reports that he does not drink alcohol and does not use drugs.  ROS: A  complete review of systems was performed.  All systems are negative except for pertinent findings as noted.  Physical Exam:  Vital signs in last 24 hours: BP 131/90   Pulse 91   Temp 97.9 F (36.6 C) (Oral)   Resp 16   Ht 5' 8"  (1.727 m)   Wt 70.9 kg   SpO2 98%   BMI 23.78 kg/m  Constitutional:  Alert and oriented, No acute distress Cardiovascular: Regular rate  Respiratory: Normal respiratory effort GI: Abdomen is soft, nontender, nondistended, no abdominal masses. No CVAT.  Genitourinary: Normal male phallus, testes are descended bilaterally and non-tender and without masses, scrotum is  normal in appearance without lesions or masses, perineum is normal on inspection. Lymphatic: No lymphadenopathy Neurologic: Grossly intact, no focal deficits Psychiatric: Normal mood and affect  Laboratory Data:  Recent Labs    04/29/20 0629  HGB 13.3  HCT 39.0    Recent Labs    04/29/20 0629  NA 141  K 4.2  CL 105  GLUCOSE 95  BUN 22  CREATININE 1.70*     Results for orders placed or performed during the hospital encounter of 04/29/20 (from the past 24 hour(s))  PT- INR Day of Surgery per protocol     Status: Abnormal   Collection Time: 04/29/20  6:05 AM  Result Value Ref Range   Prothrombin Time 15.6 (H) 11.4 - 15.2 seconds   INR 1.3 (H) 0.8 - 1.2  PTT Day of Surgery per protocol     Status: None   Collection Time: 04/29/20  6:05 AM  Result Value Ref Range   aPTT 35 24 - 36 seconds  I-STAT, chem 8     Status: Abnormal   Collection Time: 04/29/20  6:29 AM  Result Value Ref Range   Sodium 141 135 - 145 mmol/L   Potassium 4.2 3.5 - 5.1 mmol/L   Chloride 105 98 - 111 mmol/L   BUN 22 8 - 23 mg/dL   Creatinine, Ser 1.70 (H) 0.61 - 1.24 mg/dL   Glucose, Bld 95 70 - 99 mg/dL   Calcium, Ion 1.23 1.15 - 1.40 mmol/L   TCO2 23 22 - 32 mmol/L   Hemoglobin 13.3 13.0 - 17.0 g/dL   HCT 39.0 39 - 52 %   No results found for this or any previous visit (from the past 240 hour(s)).  Renal Function: Recent Labs    04/29/20 0629  CREATININE 1.70*   Estimated Creatinine Clearance: 29.6 mL/min (A) (by C-G formula based on SCr of 1.7 mg/dL (H)).  Radiologic Imaging: No results found.  Impression/Assessment:  BPH with retention  Plan:  TURP

## 2020-04-29 NOTE — Anesthesia Preprocedure Evaluation (Signed)
Anesthesia Evaluation  Patient identified by MRN, date of birth, ID band Patient awake    Reviewed: Allergy & Precautions, NPO status , Patient's Chart, lab work & pertinent test results  Airway Mallampati: II  TM Distance: >3 FB Neck ROM: Full    Dental no notable dental hx. (+) Missing, Dental Advisory Given, Teeth Intact,    Pulmonary shortness of breath, pneumonia,    Pulmonary exam normal breath sounds clear to auscultation       Cardiovascular + CAD  + dysrhythmias Atrial Fibrillation  Rhythm:Irregular Rate:Normal  Echo 10/2018 - Left ventricle: The cavity size was normal. Wall thickness was normal. Systolic function was normal. The estimated ejection fraction was 55%. Wall motion was normal; there were no regionalwall motion abnormalities. The study was not technically sufficient to allow evaluation of LV diastolic dysfunction due toatrial fibrillation.  - Aortic valve: There was trivial regurgitation.  - Left atrium: The atrium was mildly dilated.    Neuro/Psych negative neurological ROS  negative psych ROS   GI/Hepatic Neg liver ROS, GERD  ,  Endo/Other  Hypothyroidism   Renal/GU negative Renal ROS     Musculoskeletal negative musculoskeletal ROS (+)   Abdominal   Peds  Hematology  (+) Blood dyscrasia, anemia ,   Anesthesia Other Findings   Reproductive/Obstetrics                             Anesthesia Physical Anesthesia Plan  ASA: III  Anesthesia Plan: General   Post-op Pain Management:    Induction: Intravenous  PONV Risk Score and Plan: 3 and Ondansetron, Dexamethasone and Treatment may vary due to age or medical condition  Airway Management Planned: LMA  Additional Equipment: None  Intra-op Plan:   Post-operative Plan: Extubation in OR  Informed Consent: I have reviewed the patients History and Physical, chart, labs and discussed the procedure including the  risks, benefits and alternatives for the proposed anesthesia with the patient or authorized representative who has indicated his/her understanding and acceptance.     Dental advisory given  Plan Discussed with: CRNA  Anesthesia Plan Comments:         Anesthesia Quick Evaluation

## 2020-04-30 ENCOUNTER — Encounter (HOSPITAL_BASED_OUTPATIENT_CLINIC_OR_DEPARTMENT_OTHER): Payer: Self-pay | Admitting: Urology

## 2020-04-30 DIAGNOSIS — N401 Enlarged prostate with lower urinary tract symptoms: Secondary | ICD-10-CM | POA: Diagnosis not present

## 2020-04-30 LAB — SURGICAL PATHOLOGY

## 2020-04-30 MED ORDER — DOXYCYCLINE HYCLATE 100 MG PO TABS: 100.0000 mg | ORAL_TABLET | Freq: Two times a day (BID) | ORAL | Status: DC

## 2020-04-30 MED ORDER — DOXYCYCLINE HYCLATE 100 MG PO TABS: 100.0000 mg | ORAL_TABLET | Freq: Two times a day (BID) | ORAL | 0 refills | Status: DC

## 2020-04-30 NOTE — Discharge Instructions (Signed)
Transurethral Resection of the Prostate  Care After  Refer to this sheet in the next few weeks. These discharge instructions provide you with general information on caring for yourself after you leave the hospital. Your caregiver may also give you specific instructions. Your treatment has been planned according to the most current medical practices available, but unavoidable complications sometimes occur. If you have any problems or questions after discharge, please call your caregiver.  HOME CARE INSTRUCTIONS   Medications  You may receive medicine for pain management. As your level of discomfort decreases, adjustments in your pain medicines may be made.   Take all medicines as directed.   You may be given a medicine (antibiotic) to kill germs following surgery. Finish all medicines. Let your caregiver know if you have any side effects or problems from the medicine.   If you are on aspirin, it would be best not to restart the aspirin until the blood in the urine clears   for now, hold the Coumadin.  You can restart the Coumadin once your urine turns totally yellow, without any traces of blood.  Realize that in a week or 2, you will have some blood again, that is considered normal.  If you start having lots of blood shortly after restarting the Coumadin, hold it for a few more days. Hygiene  You can take a shower after surgery.   You should not take a bath while you still have the urethral catheter. Activity  You will be encouraged to get out of bed as much as possible and increase your activity level as tolerated.   Spend the first week in and around your home. For 3 weeks, avoid the following:   Straining.   Running.   Strenuous work.   Walks longer than a few blocks.   Riding for extended periods.   Sexual relations.   Do not lift heavy objects (more than 20 pounds) for at least 1 month. When lifting, use your arms instead of your abdominal muscles.   You will be  encouraged to walk as tolerated. Do not exert yourself. Increase your activity level slowly. Remember that it is important to keep moving after an operation of any type. This cuts down on the possibility of developing blood clots.   Your caregiver will tell you when you can resume driving and light housework. Discuss this at your first office visit after discharge. Diet  No special diet is ordered after a TURP. However, if you are on a special diet for another medical problem, it should be continued.   Normal fluid intake is usually recommended.   Avoid alcohol and caffeinated drinks for 2 weeks. They irritate the bladder. Decaffeinated drinks are okay.   Avoid spicy foods.  Bladder Function  For the first 10 days, empty the bladder whenever you feel a definite desire. Do not try to hold the urine for long periods of time.   Urinating once or twice a night even after you are healed is not uncommon.   You may see some recurrence of blood in the urine after discharge from the hospital. This usually happens within 2 weeks after the procedure.If this occurs, force fluids again as you did in the hospital and reduce your activity.  Bowel Function  You may experience some constipation after surgery. This can be minimized by increasing fluids and fiber in your diet. Drink enough water and fluids to keep your urine clear or pale yellow.   A stool softener may be prescribed  for use at home. Do not strain to move your bowels.   If you are requiring increased pain medicine, it is important that you take stool softeners to prevent constipation. This will help to promote proper healing by reducing the need to strain to move your bowels.  Sexual Activity  Semen movement in the opposite direction and into the bladder (retrograde ejaculation) may occur. Since the semen passes into the bladder, cloudy urine can occur the first time you urinate after intercourse. Or, you may not have an ejaculation during  erection. Ask your caregiver when you can resume sexual activity. Retrograde ejaculation and reduced semen discharge should not reduce one's pleasure of intercourse.  Postoperative Visit  Arrange the date and time of your after surgery visit with your caregiver.  Return to Work  After your recovery is complete, you will be able to return to work and resume all activities. Your caregiver will inform you when you can return to work.  Foley Catheter Care A soft, flexible tube (Foley catheter) may have been placed in your bladder to drain urine and fluid. Follow these instructions: Taking Care of the Catheter  Keep the area where the catheter leaves your body clean.   Attach the catheter to the leg so there is no tension on the catheter.   Keep the drainage bag below the level of the bladder, but keep it OFF the floor.   Do not take long soaking baths. Your caregiver will give instructions about showering.   Wash your hands before touching ANYTHING related to the catheter or bag.   Using mild soap and warm water on a washcloth:   Clean the area closest to the catheter insertion site using a circular motion around the catheter.   Clean the catheter itself by wiping AWAY from the insertion site for several inches down the tube.   NEVER wipe upward as this could sweep bacteria up into the urethra (tube in your body that normally drains the bladder) and cause infection.   Place a small amount of sterile lubricant at the tip of the penis where the catheter is entering.  Taking Care of the Drainage Bags  Two drainage bags may be taken home: a large overnight drainage bag, and a smaller leg bag which fits underneath clothing.   It is okay to wear the overnight bag at any time, but NEVER wear the smaller leg bag at night.   Keep the drainage bag well below the level of your bladder. This prevents backflow of urine into the bladder and allows the urine to drain freely.   Anchor the tubing to  your leg to prevent pulling or tension on the catheter. Use tape or a leg strap provided by the hospital.   Empty the drainage bag when it is 1/2 to 3/4 full. Wash your hands before and after touching the bag.   Periodically check the tubing for kinks to make sure there is no pressure on the tubing which could restrict the flow of urine.  Changing the Drainage Bags  Cleanse both ends of the clean bag with alcohol before changing.   Pinch off the rubber catheter to avoid urine spillage during the disconnection.   Disconnect the dirty bag and connect the clean one.   Empty the dirty bag carefully to avoid a urine spill.   Attach the new bag to the leg with tape or a leg strap.  Cleaning the Drainage Bags  Whenever a drainage bag is disconnected, it must  be cleaned quickly so it is ready for the next use.   Wash the bag in warm, soapy water.   Rinse the bag thoroughly with warm water.   Soak the bag for 30 minutes in a solution of white vinegar and water (1 cup vinegar to 1 quart warm water).   Rinse with warm water.  SEEK MEDICAL CARE IF:   You have chills or night sweats.   You are leaking around your catheter or have problems with your catheter. It is not uncommon to have sporadic leakage around your catheter as a result of bladder spasms. If the leakage stops, there is not much need for concern. If you are uncertain, call your caregiver.   You develop side effects that you think are coming from your medicines.  SEEK IMMEDIATE MEDICAL CARE IF:   You are suddenly unable to urinate. Check to see if there are any kinks in the drainage tubing that may cause this. If you cannot find any kinks, call your caregiver immediately. This is an emergency.   You develop shortness of breath or chest pains.   Bleeding persists or clots develop in your urine.   You have a fever.   You develop pain in your back or over your lower belly (abdomen).   You develop pain or swelling in your  legs.   Any problems you are having get worse rather than better.  MAKE SURE YOU:   Understand these instructions.   Will watch your condition.   Will get help right away if you are not doing well or get worse.  Document Released: 10/02/2005 Document Revised: 06/14/2011 Document Reviewed: 05/26/2009 [facility] Patient Information 2012 [place].Transurethral Resection of the Prostate Care After Refer to this sheet in the next few weeks. These discharge instructions provide you with general information on caring for yourself after you leave the hospital. Your caregiver may also give you specific instructions. Your treatment has been planned according to the most current medical practices available, but unavoidable complications sometimes occur. If you have any problems or questions after discharge, please call your caregiver.

## 2020-05-01 LAB — URINE CULTURE: Culture: 80000 — AB

## 2020-05-04 ENCOUNTER — Other Ambulatory Visit: Payer: PPO

## 2020-05-05 ENCOUNTER — Telehealth: Payer: Self-pay | Admitting: *Deleted

## 2020-05-05 NOTE — Telephone Encounter (Signed)
Pt called stating he was taken off his coumadin while in the hospital and wanted to know when he's to start it back  Please call [phone]

## 2020-05-05 NOTE — Telephone Encounter (Signed)
Spoke with patient.  S/P Turp.  Per pt discharge instructions , he was told he could resume warfarin after urine became clear and yellow.  Pt states urine has been clear since yesterday.  Told pt ok to resume warfarin at previous dose. If he develops red, bloody urine he is to stop warfarin for a couple more days and call office per urology.  Spotty urine is normal.  Pt verbalized understanding.  He has an INR appr on 05/13/20.

## 2020-05-07 ENCOUNTER — Telehealth: Payer: Self-pay | Admitting: Urology

## 2020-05-07 NOTE — Telephone Encounter (Signed)
Pt requests a nurse to return his call, he is having some issues he needs to discuss.

## 2020-05-10 NOTE — Telephone Encounter (Signed)
Pt calling to confirm appt for next week.  Also reports " spotting of blood here and there" that has resolved since post op.

## 2020-05-12 DIAGNOSIS — Z6823 Body mass index (BMI) 23.0-23.9, adult: Secondary | ICD-10-CM | POA: Diagnosis not present

## 2020-05-12 DIAGNOSIS — S161XXA Strain of muscle, fascia and tendon at neck level, initial encounter: Secondary | ICD-10-CM | POA: Diagnosis not present

## 2020-05-13 ENCOUNTER — Ambulatory Visit (INDEPENDENT_AMBULATORY_CARE_PROVIDER_SITE_OTHER): Payer: PPO | Admitting: *Deleted

## 2020-05-13 DIAGNOSIS — Z5181 Encounter for therapeutic drug level monitoring: Secondary | ICD-10-CM | POA: Diagnosis not present

## 2020-05-13 DIAGNOSIS — I4891 Unspecified atrial fibrillation: Secondary | ICD-10-CM | POA: Diagnosis not present

## 2020-05-13 LAB — POCT INR: INR: 1.2 — AB (ref 2.0–3.0)

## 2020-05-13 NOTE — Patient Instructions (Signed)
Take warfarin 2 tablets x 3 days then resume 1.5 tablets daily except 1 tablet on Sundays and Thursdays S/P TURP on 04/22/20   Recheck in 1 week

## 2020-05-17 DIAGNOSIS — H9313 Tinnitus, bilateral: Secondary | ICD-10-CM | POA: Diagnosis not present

## 2020-05-17 DIAGNOSIS — H903 Sensorineural hearing loss, bilateral: Secondary | ICD-10-CM | POA: Diagnosis not present

## 2020-05-17 DIAGNOSIS — R42 Dizziness and giddiness: Secondary | ICD-10-CM | POA: Diagnosis not present

## 2020-05-17 DIAGNOSIS — H6123 Impacted cerumen, bilateral: Secondary | ICD-10-CM | POA: Diagnosis not present

## 2020-05-18 ENCOUNTER — Encounter: Payer: Self-pay | Admitting: Urology

## 2020-05-18 ENCOUNTER — Other Ambulatory Visit: Payer: Self-pay

## 2020-05-18 ENCOUNTER — Ambulatory Visit (INDEPENDENT_AMBULATORY_CARE_PROVIDER_SITE_OTHER): Payer: PPO | Admitting: Urology

## 2020-05-18 VITALS — BP 131/64 | HR 74 | Temp 97.6°F | Ht 68.0 in | Wt 156.0 lb

## 2020-05-18 DIAGNOSIS — R338 Other retention of urine: Secondary | ICD-10-CM | POA: Diagnosis not present

## 2020-05-18 DIAGNOSIS — N401 Enlarged prostate with lower urinary tract symptoms: Secondary | ICD-10-CM

## 2020-05-18 LAB — MICROSCOPIC EXAMINATION
Renal Epithel, UA: NONE SEEN /hpf
WBC, UA: >30 /hpf — AB (ref 0–5)

## 2020-05-18 LAB — URINALYSIS, ROUTINE W REFLEX MICROSCOPIC
Bilirubin, UA: NEGATIVE
Glucose, UA: NEGATIVE
Ketones, UA: NEGATIVE
Nitrite, UA: NEGATIVE
Protein,UA: NEGATIVE
Specific Gravity, UA: 1.015 (ref 1.005–1.030)
Urobilinogen, Ur: 0.2 mg/dL (ref 0.2–1.0)
pH, UA: 5.5 (ref 5.0–7.5)

## 2020-05-18 NOTE — Progress Notes (Signed)
H&P  Chief Complaint: F/U after TURP, BPH w/LUTS  History of Present Illness:  8.3.2021: Pt underwent TURP on 7.21.2021: Pt experienced gross hematuria immediately following surgery, but symptoms have since abated. Pt is pleased with the results of his TURP and has multiple questions.  (below copied from [facility] records):  01/05/20: This man presents for Urolift procedure. he is catheter dependent for BPH/retention.  Prostate volume   01/15/20: Patient underwent Urolift procedure on 03/22 with placement of 7 implants. He had unsuccessful voiding trial at [facility] office on 3/26. He presented back to the clinic later that afternoon and 16 French catheter was placed with 300 cc of urine noted. He presents today for repeat trial of void. He states that he continues to tolerate the catheter well. He denies any gross hematuria or fevers. No complaints of painful urgency. He remains on tamsulosin and Proscar.     IPSS Questionnaire (AUA-7): Over the past month.   1)  How often have you had a sensation of not emptying your bladder completely after you finish urinating?  3 - About half the time  2)  How often have you had to urinate again less than two hours after you finished urinating? 2 - Less than half the time  3)  How often have you found you stopped and started again several times when you urinated?  0 - Not at all  4) How difficult have you found it to postpone urination?  0 - Not at all  5) How often have you had a weak urinary stream?  1 - Less than 1 time in 5  6) How often have you had to push or strain to begin urination?  0 - Not at all  7) How many times did you most typically get up to urinate from the time you went to bed until the time you got up in the morning?  4 - 4 times  Total score:  0-7 mildly symptomatic   8-19 moderately symptomatic   20-35 severely symptomatic   QOL score: 1   Past Medical History:  Diagnosis Date  . Anemia   . Atrial fibrillation ([place])    Permanent  . BPH  (benign prostatic hyperplasia)   . CAD (coronary artery disease)    Catheterization 2004, mild/moderate nonobstructive disease  /   nuclear, 2007, small inferior scar // no ischemia  . Carotid bruit    Doppler, December 23, 2010, no significant carotid disease  . Clot    LA, small, in past, pt unaware  . Crohn's disease ([place])   . Dizziness 03/2010   June, 2011   Mild, stop digoxin, felt better  . Ejection fraction 02/2009   EF 55-60%,Echo, May, 2010  . Elevated PSA   . GERD (gastroesophageal reflux disease)   . History of kidney stones    remote  . Hypothyroidism   . LVH (left ventricular hypertrophy) 02/2009    Mild, echo, 2010  . Mitral regurgitation    Mild, echo, flat closure  . Nausea vomiting and diarrhea 01/2010   Hospitalization, probably viral (or partial small bowel obstruction)/ Hospital 09/2010, recurrent possible partial small bowel obstruction, medical therapy  . Other malaise and fatigue   . Pneumonia    teenager  . SBO (small bowel obstruction) ([place])   . Urinary retention   . Warfarin anticoagulation    Atrial fibrillation  . Wears glasses     Past Surgical History:  Procedure Laterality Date  . AGILE CAPSULE  10/18/2011  Procedure: AGILE CAPSULE;  Surgeon: Rogene Houston, MD;  [facility];  Service: Endoscopy;  Laterality: N/A;  730  . CARDIAC CATHETERIZATION  2004  . CHOLECYSTECTOMY  2010   Dr. Anthony Sar  . COLONOSCOPY  2008   [facility]  . COLONOSCOPY N/A 03/23/2016   Procedure: COLONOSCOPY;  Surgeon: Rogene Houston, MD;  [facility];  Service: Endoscopy;  Laterality: N/A;  1:00  . CYSTOSCOPY WITH INSERTION OF UROLIFT    . ESOPHAGOGASTRODUODENOSCOPY  11/08/2011   Procedure: ESOPHAGOGASTRODUODENOSCOPY (EGD);  Surgeon: Rogene Houston, MD;  [facility];  Service: Endoscopy;  Laterality: N/A;  300  . POLYPECTOMY  03/23/2016   Procedure: POLYPECTOMY;  Surgeon: Rogene Houston, MD;  [facility];  Service: Endoscopy;;   Cecal polyp removed via cold forceps recto-sigmoid polyp removed via cold snare  . TRANSURETHRAL RESECTION OF PROSTATE N/A 04/29/2020   Procedure: TRANSURETHRAL RESECTION OF THE PROSTATE (TURP);  Surgeon: Franchot Gallo, MD;  Location: [facility];  Service: Urology;  Laterality: N/A;  90 MINS    Home Medications:  Allergies as of 05/18/2020   No Known Allergies     Medication List       Accurate as of May 18, 2020 11:10 AM. If you have any questions, ask your nurse or doctor.        acetaminophen 500 MG tablet Commonly known as: TYLENOL Take 500 mg by mouth every 6 (six) hours as needed.   ALIGN PREBIOTIC-PROBIOTIC PO Take by mouth daily.   carvedilol 6.25 MG tablet Commonly known as: COREG TAKE ONE TABLET BY MOUTH TWICE DAILY.   cyclobenzaprine 10 MG tablet Commonly known as: FLEXERIL Take 10 mg by mouth at bedtime.   diltiazem 180 MG 24 hr capsule Commonly known as: CARDIZEM CD Take 180 mg by mouth 2 (two) times daily.   erythromycin ophthalmic ointment at bedtime.   famotidine 40 MG tablet Commonly known as: PEPCID Take 1 tablet (40 mg total) by mouth at bedtime.   levothyroxine 75 MCG tablet Commonly known as: SYNTHROID Take 75 mcg by mouth daily before breakfast.   magnesium gluconate 500 MG tablet Commonly known as: MAGONATE Take 500 mg by mouth 2 (two) times daily.   Melatonin 10 MG Tabs Take 10 mg by mouth at bedtime.   meloxicam 15 MG tablet Commonly known as: MOBIC Take 15 mg by mouth daily.   mesalamine 500 MG CR capsule Commonly known as: PENTASA Take 500 mg by mouth 4 (four) times daily.   nitroGLYCERIN 0.4 MG SL tablet Commonly known as: NITROSTAT Place 0.4 mg under the tongue every 5 (five) minutes as needed. For chest pains. May repeat for up to 3 doses.   simvastatin 10 MG tablet Commonly known as: ZOCOR Take 10 mg by mouth at bedtime.   warfarin 2.5 MG tablet Commonly known as: COUMADIN Take as directed by  the anticoagulation clinic. If you are unsure how to take this medication, talk to your nurse or doctor. Original instructions: Take 2.5 mg by mouth as directed.       Allergies: No Known Allergies  Family History  Problem Relation Age of Onset  . Colon cancer Mother   . Heart disease Father   . Stroke Brother   . Healthy Daughter     Social History:  reports that he has never smoked. He has never used smokeless tobacco. He reports that he does not drink alcohol and does not use drugs.  ROS: A complete review of systems was performed.  All systems are negative except for pertinent findings as noted.  Physical Exam:  Vital signs in last 24 hours: There were no vitals taken for this visit. Constitutional:  Alert and oriented, No acute distress Cardiovascular: Regular rate  Respiratory: Normal respiratory effort Neurologic: Grossly intact, no focal deficits Psychiatric: Normal mood and affect  I have reviewed prior pt notes  I have reviewed notes from referring/previous physicians  I have reviewed urinalysis results  I have independently reviewed prior imaging  I reviewed the patient's pathology results-about 30 g resected, all benign Impression/Assessment:  BPH, failed a UroLift. Recent TURP has improved symptomatology. He seems to be doing well  Plan:  1. Patient's questions answered  2. F/U in 6 months for symptom recheck.  CC: Dr. Quintin Alto

## 2020-05-18 NOTE — Progress Notes (Signed)
Urological Symptom Review  Patient is experiencing the following symptoms: Get up at night to urinate   Review of Systems  Gastrointestinal (upper)  : Indigestion/heartburn  Gastrointestinal (lower) : Negative for lower GI symptoms  Constitutional : Negative for symptoms  Skin: Negative for skin symptoms  Eyes: Negative for eye symptoms  Ear/Nose/Throat : Negative for Ear/Nose/Throat symptoms  Hematologic/Lymphatic: Easy bruising  Cardiovascular : Negative for cardiovascular symptoms  Respiratory : Negative for respiratory symptoms  Endocrine: Negative for endocrine symptoms  Musculoskeletal: Back pain  Neurological: Negative for neurological symptoms  Psychologic: Negative for psychiatric symptoms

## 2020-05-19 ENCOUNTER — Ambulatory Visit (INDEPENDENT_AMBULATORY_CARE_PROVIDER_SITE_OTHER): Payer: PPO

## 2020-05-19 ENCOUNTER — Ambulatory Visit (INDEPENDENT_AMBULATORY_CARE_PROVIDER_SITE_OTHER): Payer: PPO | Admitting: *Deleted

## 2020-05-19 DIAGNOSIS — I4891 Unspecified atrial fibrillation: Secondary | ICD-10-CM | POA: Diagnosis not present

## 2020-05-19 DIAGNOSIS — I4821 Permanent atrial fibrillation: Secondary | ICD-10-CM

## 2020-05-19 DIAGNOSIS — Z5181 Encounter for therapeutic drug level monitoring: Secondary | ICD-10-CM | POA: Diagnosis not present

## 2020-05-19 LAB — ECHOCARDIOGRAM COMPLETE
Area-P 1/2: 3.41 cm2
Calc EF: 58.8 %
MV M vel: 4.83 m/s
MV Peak grad: 93.3 mmHg
P 1/2 time: 1389 msec
S' Lateral: 3.16 cm
Single Plane A2C EF: 56 %
Single Plane A4C EF: 59.2 %

## 2020-05-19 LAB — POCT INR: INR: 2 (ref 2.0–3.0)

## 2020-05-19 NOTE — Patient Instructions (Signed)
Continue warfarin 1.5 tablets daily except 1 tablet on Sundays and Thursdays S/P TURP on 04/22/20   Recheck in 3 weeks

## 2020-05-24 ENCOUNTER — Telehealth: Payer: Self-pay | Admitting: *Deleted

## 2020-05-24 NOTE — Telephone Encounter (Signed)
-----   Message from Verta Ellen., NP sent at 05/21/2020  5:54 PM EDT ----- Please call the patient and tell him the pumping function looks good.  He has mild to moderate leaking mitral valve.  Mildly leaking aortic valve.  Thank you

## 2020-05-24 NOTE — Telephone Encounter (Signed)
Patient informed. Copy sent to PCP °

## 2020-06-08 DIAGNOSIS — R42 Dizziness and giddiness: Secondary | ICD-10-CM | POA: Diagnosis not present

## 2020-06-09 ENCOUNTER — Ambulatory Visit (INDEPENDENT_AMBULATORY_CARE_PROVIDER_SITE_OTHER): Payer: PPO | Admitting: *Deleted

## 2020-06-09 DIAGNOSIS — I4891 Unspecified atrial fibrillation: Secondary | ICD-10-CM

## 2020-06-09 DIAGNOSIS — Z5181 Encounter for therapeutic drug level monitoring: Secondary | ICD-10-CM | POA: Diagnosis not present

## 2020-06-09 LAB — POCT INR: INR: 3 (ref 2.0–3.0)

## 2020-06-09 NOTE — Patient Instructions (Signed)
Continue warfarin 1.5 tablets daily except 1 tablet on Sundays and Thursdays S/P TURP on 04/22/20   Recheck in 4 weeks

## 2020-06-15 DIAGNOSIS — E7849 Other hyperlipidemia: Secondary | ICD-10-CM | POA: Diagnosis not present

## 2020-06-15 DIAGNOSIS — I482 Chronic atrial fibrillation, unspecified: Secondary | ICD-10-CM | POA: Diagnosis not present

## 2020-06-15 DIAGNOSIS — N183 Chronic kidney disease, stage 3 unspecified: Secondary | ICD-10-CM | POA: Diagnosis not present

## 2020-06-15 DIAGNOSIS — I129 Hypertensive chronic kidney disease with stage 1 through stage 4 chronic kidney disease, or unspecified chronic kidney disease: Secondary | ICD-10-CM | POA: Diagnosis not present

## 2020-06-16 ENCOUNTER — Ambulatory Visit: Payer: PPO | Admitting: Cardiovascular Disease

## 2020-06-16 DIAGNOSIS — H903 Sensorineural hearing loss, bilateral: Secondary | ICD-10-CM | POA: Diagnosis not present

## 2020-06-16 DIAGNOSIS — R42 Dizziness and giddiness: Secondary | ICD-10-CM | POA: Diagnosis not present

## 2020-06-24 ENCOUNTER — Telehealth: Payer: PPO | Admitting: Cardiovascular Disease

## 2020-06-25 DIAGNOSIS — Z6823 Body mass index (BMI) 23.0-23.9, adult: Secondary | ICD-10-CM | POA: Diagnosis not present

## 2020-06-25 DIAGNOSIS — H6092 Unspecified otitis externa, left ear: Secondary | ICD-10-CM | POA: Diagnosis not present

## 2020-06-30 ENCOUNTER — Encounter: Payer: Self-pay | Admitting: Cardiology

## 2020-06-30 DIAGNOSIS — N183 Chronic kidney disease, stage 3 unspecified: Secondary | ICD-10-CM | POA: Diagnosis not present

## 2020-06-30 DIAGNOSIS — K2100 Gastro-esophageal reflux disease with esophagitis, without bleeding: Secondary | ICD-10-CM | POA: Diagnosis not present

## 2020-06-30 DIAGNOSIS — I10 Essential (primary) hypertension: Secondary | ICD-10-CM | POA: Diagnosis not present

## 2020-06-30 DIAGNOSIS — R7301 Impaired fasting glucose: Secondary | ICD-10-CM | POA: Diagnosis not present

## 2020-06-30 DIAGNOSIS — R5383 Other fatigue: Secondary | ICD-10-CM | POA: Diagnosis not present

## 2020-06-30 DIAGNOSIS — E7800 Pure hypercholesterolemia, unspecified: Secondary | ICD-10-CM | POA: Diagnosis not present

## 2020-06-30 DIAGNOSIS — E039 Hypothyroidism, unspecified: Secondary | ICD-10-CM | POA: Diagnosis not present

## 2020-07-02 ENCOUNTER — Ambulatory Visit (INDEPENDENT_AMBULATORY_CARE_PROVIDER_SITE_OTHER): Payer: PPO | Admitting: *Deleted

## 2020-07-02 ENCOUNTER — Other Ambulatory Visit: Payer: Self-pay

## 2020-07-02 DIAGNOSIS — Z5181 Encounter for therapeutic drug level monitoring: Secondary | ICD-10-CM

## 2020-07-02 DIAGNOSIS — I4891 Unspecified atrial fibrillation: Secondary | ICD-10-CM

## 2020-07-02 LAB — POCT INR: INR: 3.3 — AB (ref 2.0–3.0)

## 2020-07-02 NOTE — Patient Instructions (Signed)
Hold warfarin tonight then decrease dose to 1 tablet daily except 1 1/2 tablets on Mondays, Wednesdays and Fridays Recheck in 4 weeks

## 2020-07-05 ENCOUNTER — Encounter: Payer: Self-pay | Admitting: Cardiology

## 2020-07-05 NOTE — Progress Notes (Signed)
Cardiology Office Note  Date: 07/06/2020   ID: [name], DOB 1931-02-14, MRN [id number]  PCP:  Manon Hilding, MD  Cardiologist:  Rozann Lesches, MD Electrophysiologist:  None   Chief Complaint  Patient presents with  . Cardiac follow-up    History of Present Illness: [name] is an 86 y.o. male former patient of Dr. Bronson Ing now presenting to establish follow-up with me.  I reviewed his records and updated the chart.  He was last seen in June by Mr. Leonides Sake NP.  He is here today with his wife for a follow-up visit.  He reports mild ankle edema, not bothersome in terms of discomfort, no orthopnea or PND.  He does not describe any angina symptoms, walks about a quarter of a mile each day, does have some limitations related to back pain.  He does not describe any sense of palpitations.  Does have a sense of unsteadiness at times and was evaluated by Dr. Benjamine Mola with suspicion of bilateral peripheral vestibular weakness and central vestibular dysfunction.  He is on Coumadin with follow-up in anticoagulation clinic.  Last INR was 3.3.  He does not report any spontaneous bleeding problems.  Follow-up echocardiogram in August revealed LVEF approximately 55%, normal RV contraction and RVSP, moderately dilated left atrium with mild to moderate mitral regurgitation.  We discussed these results today, overall reassuring.  I reviewed his medications which are outlined below.  I personally reviewed his ECG today which shows rate controlled atrial fibrillation.  Patient's wife mentioned to me that she was previously a Network engineer for [employer] in the [employer].  Past Medical History:  Diagnosis Date  . Anemia   . Atrial fibrillation ([place])   . BPH (benign prostatic hyperplasia)   . CAD (coronary artery disease)    Catheterization 2004, mild/moderate nonobstructive disease  /   nuclear, 2007, small inferior scar // no ischemia  . Crohn's disease ([place])   . Elevated  PSA   . GERD (gastroesophageal reflux disease)   . History of kidney stones   . History of pneumonia   . Hypothyroidism   . Mitral regurgitation   . SBO (small bowel obstruction) ([place])   . Urinary retention   . Wears glasses     Past Surgical History:  Procedure Laterality Date  . AGILE CAPSULE  10/18/2011   Procedure: AGILE CAPSULE;  Surgeon: Rogene Houston, MD;  [facility];  Service: Endoscopy;  Laterality: N/A;  730  . CARDIAC CATHETERIZATION  2004  . CHOLECYSTECTOMY  2010   Dr. Anthony Sar  . COLONOSCOPY  2008   [facility]  . COLONOSCOPY N/A 03/23/2016   Procedure: COLONOSCOPY;  Surgeon: Rogene Houston, MD;  [facility];  Service: Endoscopy;  Laterality: N/A;  1:00  . CYSTOSCOPY WITH INSERTION OF UROLIFT    . ESOPHAGOGASTRODUODENOSCOPY  11/08/2011   Procedure: ESOPHAGOGASTRODUODENOSCOPY (EGD);  Surgeon: Rogene Houston, MD;  [facility];  Service: Endoscopy;  Laterality: N/A;  300  . POLYPECTOMY  03/23/2016   Procedure: POLYPECTOMY;  Surgeon: Rogene Houston, MD;  [facility];  Service: Endoscopy;;  Cecal polyp removed via cold forceps recto-sigmoid polyp removed via cold snare  . TRANSURETHRAL RESECTION OF PROSTATE N/A 04/29/2020   Procedure: TRANSURETHRAL RESECTION OF THE PROSTATE (TURP);  Surgeon: Franchot Gallo, MD;  Location: [facility];  Service: Urology;  Laterality: N/A;  90 MINS    Current Outpatient Medications  Medication Sig  Dispense Refill  . acetaminophen (TYLENOL) 500 MG tablet Take 500 mg by mouth every 6 (six) hours as needed.    . Bacillus Coagulans-Inulin (ALIGN PREBIOTIC-PROBIOTIC PO) Take by mouth daily.     . carvedilol (COREG) 6.25 MG tablet TAKE ONE TABLET BY MOUTH TWICE DAILY. 60 tablet 6  . diltiazem (CARDIZEM CD) 180 MG 24 hr capsule Take 180 mg by mouth 2 (two) times daily.     . famotidine (PEPCID) 40 MG tablet Take 1 tablet (40 mg total) by mouth at bedtime. 90 tablet 3  . levothyroxine  (SYNTHROID, LEVOTHROID) 75 MCG tablet Take 75 mcg by mouth daily before breakfast.    . magnesium gluconate (MAGONATE) 500 MG tablet Take 500 mg by mouth 2 (two) times daily.     . Melatonin 10 MG TABS Take 10 mg by mouth at bedtime.     . mesalamine (PENTASA) 500 MG CR capsule Take 500 mg by mouth 4 (four) times daily.    . nitroGLYCERIN (NITROSTAT) 0.4 MG SL tablet Place 0.4 mg under the tongue every 5 (five) minutes as needed. For chest pains. May repeat for up to 3 doses.    . simvastatin (ZOCOR) 10 MG tablet Take 10 mg by mouth at bedtime.     Marland Kitchen warfarin (COUMADIN) 2.5 MG tablet Take 2.5 mg by mouth as directed.     No current facility-administered medications for this visit.   Allergies:  Patient has no known allergies.   ROS:  No syncope.  Physical Exam: VS:  BP 126/64   Pulse 69   Ht 5' 8"  (1.727 m)   Wt 157 lb (71.2 kg)   SpO2 96%   BMI 23.87 kg/m , BMI Body mass index is 23.87 kg/m.  Wt Readings from Last 3 Encounters:  07/06/20 157 lb (71.2 kg)  05/18/20 156 lb (70.8 kg)  04/29/20 156 lb 6.4 oz (70.9 kg)    General: Patient appears comfortable at rest. HEENT: Conjunctiva and lids normal, wearing a mask. Neck: Supple, no elevated JVP, possible soft right carotid bruit, no thyromegaly. Lungs: Clear to auscultation, nonlabored breathing at rest. Cardiac: Irregularly irregular, no S3, soft systolic murmur, no pericardial rub. Extremities: Trace ankle edema, distal pulses 2+.  ECG:  An ECG dated 06/12/2019 was personally reviewed today and demonstrated:  Rate controlled atrial fibrillation with low voltage.  Recent Labwork: 09/25/2019: ALT 21; AST 16; Platelets 258 04/29/2020: BUN 22; Creatinine, Ser 1.70; Hemoglobin 13.3; Potassium 4.2; Sodium 141   Other Studies Reviewed Today:  Echocardiogram 05/19/2020: 1. Left ventricular ejection fraction, by estimation, is approximately  55%. Left ventricular diastolic parameters are indeterminate in the  setting of atrial  fibrillation.  2. Right ventricular systolic function is normal. The right ventricular  size is normal. There is normal pulmonary artery systolic pressure. The  estimated right ventricular systolic pressure is 34.1 mmHg.  3. Left atrial size was moderately dilated.  4. The mitral valve is grossly normal. Mild to moderate mitral valve  regurgitation.  5. The aortic valve is tricuspid. Aortic valve regurgitation is mild.  6. The inferior vena cava is normal in size with greater than 50%  respiratory variability, suggesting right atrial pressure of 3 mmHg.   Assessment and Plan:  1.  Permanent atrial fibrillation.  CHA2DS2-VASc score is 2-3.  He remains on Coumadin for stroke prophylaxis.  He is asymptomatic in terms of palpitations and has good heart rate control on combination of Coreg and Cardizem CD.  ECG reviewed.  No  changes were made today.  2.  CAD, nonobstructive by previous cardiac catheterization in 2004 and with low risk nonischemic evaluation in 2018.  He does not report any active angina, nitroglycerin available as needed.  Continue statin therapy.  Requesting recent lab work from PCP.  3.  Mitral regurgitation, mild to moderate by recent follow-up echocardiogram.  4.  Possible soft right carotid bruit.  Obtain carotid Dopplers.  Medication Adjustments/Labs and Tests Ordered: Current medicines are reviewed at length with the patient today.  Concerns regarding medicines are outlined above.   Tests Ordered: Orders Placed This Encounter  Procedures  . EKG 12-Lead  . VAS US CAROTID    Medication Changes: No orders of the defined types were placed in this encounter.   Disposition:  Follow up 6 months in the [place] office.  Signed, Satira Sark, MD, [facility] 07/06/2020 9:08 AM    [facility] [place], [address], [place] [address] Phone: [phone]; Fax: [phone]

## 2020-07-06 ENCOUNTER — Encounter: Payer: Self-pay | Admitting: Cardiology

## 2020-07-06 ENCOUNTER — Encounter: Payer: Self-pay | Admitting: *Deleted

## 2020-07-06 ENCOUNTER — Ambulatory Visit (INDEPENDENT_AMBULATORY_CARE_PROVIDER_SITE_OTHER): Payer: PPO | Admitting: Cardiology

## 2020-07-06 VITALS — BP 126/64 | HR 69 | Ht 68.0 in | Wt 157.0 lb

## 2020-07-06 DIAGNOSIS — R0989 Other specified symptoms and signs involving the circulatory and respiratory systems: Secondary | ICD-10-CM | POA: Diagnosis not present

## 2020-07-06 DIAGNOSIS — I4821 Permanent atrial fibrillation: Secondary | ICD-10-CM | POA: Diagnosis not present

## 2020-07-06 DIAGNOSIS — I25119 Atherosclerotic heart disease of native coronary artery with unspecified angina pectoris: Secondary | ICD-10-CM | POA: Diagnosis not present

## 2020-07-06 NOTE — Patient Instructions (Addendum)
Medication Instructions:   Your physician recommends that you continue on your current medications as directed. Please refer to the Current Medication list given to you today.  Labwork:  None  Testing/Procedures: Your physician has requested that you have a carotid duplex. This test is an ultrasound of the carotid arteries in your neck. It looks at blood flow through these arteries that supply the brain with blood. Allow one hour for this exam. There are no restrictions or special instructions.  Follow-Up:  Your physician recommends that you schedule a follow-up appointment in: 6 months.  Any Other Special Instructions Will Be Listed Below (If Applicable).  If you need a refill on your cardiac medications before your next appointment, please call your pharmacy.

## 2020-07-07 ENCOUNTER — Ambulatory Visit (INDEPENDENT_AMBULATORY_CARE_PROVIDER_SITE_OTHER): Payer: PPO

## 2020-07-07 DIAGNOSIS — R0989 Other specified symptoms and signs involving the circulatory and respiratory systems: Secondary | ICD-10-CM

## 2020-07-07 DIAGNOSIS — R7301 Impaired fasting glucose: Secondary | ICD-10-CM | POA: Diagnosis not present

## 2020-07-07 DIAGNOSIS — Z23 Encounter for immunization: Secondary | ICD-10-CM | POA: Diagnosis not present

## 2020-07-07 DIAGNOSIS — N1330 Unspecified hydronephrosis: Secondary | ICD-10-CM | POA: Diagnosis not present

## 2020-07-07 DIAGNOSIS — K56600 Partial intestinal obstruction, unspecified as to cause: Secondary | ICD-10-CM | POA: Diagnosis not present

## 2020-07-07 DIAGNOSIS — I10 Essential (primary) hypertension: Secondary | ICD-10-CM | POA: Diagnosis not present

## 2020-07-09 ENCOUNTER — Other Ambulatory Visit: Payer: Self-pay | Admitting: Family Medicine

## 2020-07-09 ENCOUNTER — Telehealth: Payer: Self-pay | Admitting: *Deleted

## 2020-07-09 ENCOUNTER — Ambulatory Visit: Payer: PPO | Admitting: Cardiovascular Disease

## 2020-07-09 DIAGNOSIS — G8929 Other chronic pain: Secondary | ICD-10-CM

## 2020-07-09 NOTE — Telephone Encounter (Signed)
Patient with diagnosis of afib on warfarin for anticoagulation.    Procedure: lumbar ESI Date of procedure: TBD  CHADS2-VASc score of 3 (age x2, CAD). Diastolic CHF also noted on PMH however 2 most recent echos do not mention this, and neither do cardiology notes.  Per office protocol, patient can hold warfarin for 5 days prior to procedure.

## 2020-07-09 NOTE — Telephone Encounter (Signed)
   [facility] Pre-operative Risk Assessment    HEARTCARE STAFF: - Please ensure there is not already an duplicate clearance open for this procedure. - Under Visit Info/Reason for Call, type in Other and utilize the format Clearance MM/DD/YY or Clearance TBD. Do not use dashes or single digits. - If request is for dental extraction, please clarify the # of teeth to be extracted.  Request for surgical clearance:  1. What type of surgery is being performed? LUMBAR ESI  2. When is this surgery scheduled? TBD  3. What type of clearance is required (medical clearance vs. Pharmacy clearance to hold med vs. Both)? PHARM  4. Are there any medications that need to be held prior to surgery and how long? WARFARIN x 4 DAYS PRIOR TO INJECTION  5. Practice name and name of physician performing surgery? [facility]; MD NOT LISTED   6. What is the office phone number? [phone]   7.   What is the office fax number? [phone]  8.   Anesthesia type (None, local, MAC, general) ? LOCAL   [name] 07/09/2020, 11:20 AM  _________________________________________________________________   (provider comments below)

## 2020-07-09 NOTE — Telephone Encounter (Signed)
   Primary Cardiologist: Rozann Lesches, MD  Chart reviewed as part of pre-operative protocol coverage. Given past medical history and time since last visit, based on ACC/AHA guidelines, [name] would be at acceptable risk for the planned procedure without further cardiovascular testing.   Okay to hold warfarin for 5 days prior to procedure and start back ASAP thereafter. CHADS VASC Score of 3.  I will route this recommendation to the requesting party via Epic fax function and remove from pre-op pool.    Please call with questions.  [name]. [name] DNP, ANP, AACC  07/09/2020, 1:12 PM

## 2020-07-09 NOTE — Telephone Encounter (Signed)
Please make recommendations concerning coumadin. He is seen in our [place] clinic for coumadin dosing. CHADS of 2.   Thank you  Curt Bears

## 2020-07-09 NOTE — Telephone Encounter (Signed)
-----   Message from Satira Sark, MD sent at 07/09/2020  1:02 PM EDT ----- Results reviewed.  Please let him know that the carotid Dopplers looked good overall, only mild atherosclerosis involving the left ICA.

## 2020-07-13 NOTE — Telephone Encounter (Signed)
Laurine Blazer, LPN  02/28/1442 2:46 PM EDT Back to Top    Notified, copy to pcp.

## 2020-07-14 ENCOUNTER — Telehealth: Payer: Self-pay | Admitting: *Deleted

## 2020-07-14 NOTE — Telephone Encounter (Signed)
Wanted to speak with you about r/s his apt for his upcoming procedure   [phone]

## 2020-07-14 NOTE — Telephone Encounter (Signed)
Pending back injection on 07/23/20.  Has been cleared to hold warfarin 5 days before procedure.  Will take last dose of warfarin 10/2 and resume night of procedure taking 2.71m  Daily except 3.766mon M,W,F.  Will come for INR check 107/21 before procedure and 08/02/20 after resuming warfarin.

## 2020-07-15 DIAGNOSIS — N183 Chronic kidney disease, stage 3 unspecified: Secondary | ICD-10-CM | POA: Diagnosis not present

## 2020-07-15 DIAGNOSIS — I2510 Atherosclerotic heart disease of native coronary artery without angina pectoris: Secondary | ICD-10-CM | POA: Diagnosis not present

## 2020-07-15 DIAGNOSIS — E7849 Other hyperlipidemia: Secondary | ICD-10-CM | POA: Diagnosis not present

## 2020-07-15 DIAGNOSIS — I482 Chronic atrial fibrillation, unspecified: Secondary | ICD-10-CM | POA: Diagnosis not present

## 2020-07-15 DIAGNOSIS — I129 Hypertensive chronic kidney disease with stage 1 through stage 4 chronic kidney disease, or unspecified chronic kidney disease: Secondary | ICD-10-CM | POA: Diagnosis not present

## 2020-07-22 ENCOUNTER — Ambulatory Visit (INDEPENDENT_AMBULATORY_CARE_PROVIDER_SITE_OTHER): Payer: PPO | Admitting: *Deleted

## 2020-07-22 DIAGNOSIS — Z5181 Encounter for therapeutic drug level monitoring: Secondary | ICD-10-CM

## 2020-07-22 DIAGNOSIS — I4891 Unspecified atrial fibrillation: Secondary | ICD-10-CM | POA: Diagnosis not present

## 2020-07-22 LAB — POCT INR: INR: 1.2 — AB (ref 2.0–3.0)

## 2020-07-22 NOTE — Patient Instructions (Signed)
Has been off warfarin x 5 days for back injection tomorrow. Continue to hold warfarin tonight then resume 1 tablet daily except 1 1/2 tablets on Mondays, Wednesdays and Fridays tomorrow night. Recheck in 10 days

## 2020-07-23 ENCOUNTER — Ambulatory Visit
Admission: RE | Admit: 2020-07-23 | Discharge: 2020-07-23 | Disposition: A | Discharge status: Home / Self Care | Payer: PPO | Source: Ambulatory Visit | Attending: Family Medicine | Admitting: Family Medicine

## 2020-07-23 ENCOUNTER — Other Ambulatory Visit: Payer: Self-pay

## 2020-07-23 DIAGNOSIS — G8929 Other chronic pain: Secondary | ICD-10-CM

## 2020-07-23 DIAGNOSIS — M545 Low back pain, unspecified: Secondary | ICD-10-CM

## 2020-07-23 DIAGNOSIS — M47817 Spondylosis without myelopathy or radiculopathy, lumbosacral region: Secondary | ICD-10-CM | POA: Diagnosis not present

## 2020-07-23 MED ORDER — METHYLPREDNISOLONE ACETATE 40 MG/ML INJ SUSP (RADIOLOG
120.0000 mg | Freq: Once | INTRAMUSCULAR | Status: AC
  Administered 2020-07-23: 120 mg via EPIDURAL

## 2020-07-23 MED ORDER — IOPAMIDOL (ISOVUE-M 200) INJECTION 41%
1.0000 mL | Freq: Once | INTRAMUSCULAR | Status: AC
  Administered 2020-07-23: 1 mL via EPIDURAL

## 2020-07-23 NOTE — Discharge Instructions (Signed)

## 2020-08-02 ENCOUNTER — Ambulatory Visit (INDEPENDENT_AMBULATORY_CARE_PROVIDER_SITE_OTHER): Payer: PPO | Admitting: *Deleted

## 2020-08-02 DIAGNOSIS — I4891 Unspecified atrial fibrillation: Secondary | ICD-10-CM | POA: Diagnosis not present

## 2020-08-02 DIAGNOSIS — Z5181 Encounter for therapeutic drug level monitoring: Secondary | ICD-10-CM | POA: Diagnosis not present

## 2020-08-02 LAB — POCT INR: INR: 3 (ref 2.0–3.0)

## 2020-08-02 NOTE — Patient Instructions (Signed)
Decrease warfarin to 1 tablet daily except 1 1/2 tablets on Tuesdays and Fridays Recheck in 3 wks

## 2020-08-14 DIAGNOSIS — E7849 Other hyperlipidemia: Secondary | ICD-10-CM | POA: Diagnosis not present

## 2020-08-14 DIAGNOSIS — N183 Chronic kidney disease, stage 3 unspecified: Secondary | ICD-10-CM | POA: Diagnosis not present

## 2020-08-14 DIAGNOSIS — I129 Hypertensive chronic kidney disease with stage 1 through stage 4 chronic kidney disease, or unspecified chronic kidney disease: Secondary | ICD-10-CM | POA: Diagnosis not present

## 2020-08-14 DIAGNOSIS — I482 Chronic atrial fibrillation, unspecified: Secondary | ICD-10-CM | POA: Diagnosis not present

## 2020-08-18 DIAGNOSIS — L570 Actinic keratosis: Secondary | ICD-10-CM | POA: Diagnosis not present

## 2020-08-20 ENCOUNTER — Other Ambulatory Visit: Payer: Self-pay | Admitting: *Deleted

## 2020-08-20 MED ORDER — CARVEDILOL 6.25 MG PO TABS: 6.2500 mg | ORAL_TABLET | Freq: Two times a day (BID) | ORAL | 6 refills | Status: DC

## 2020-08-23 ENCOUNTER — Telehealth: Payer: Self-pay

## 2020-08-23 NOTE — Telephone Encounter (Signed)
lmom to r/s appt as the coumadin nurse lisa reid rn is out sick today.

## 2020-08-24 ENCOUNTER — Other Ambulatory Visit: Payer: Self-pay | Admitting: Family Medicine

## 2020-08-24 DIAGNOSIS — G8929 Other chronic pain: Secondary | ICD-10-CM

## 2020-08-24 DIAGNOSIS — M545 Low back pain, unspecified: Secondary | ICD-10-CM

## 2020-09-02 ENCOUNTER — Ambulatory Visit (INDEPENDENT_AMBULATORY_CARE_PROVIDER_SITE_OTHER): Payer: PPO | Admitting: *Deleted

## 2020-09-02 DIAGNOSIS — I4891 Unspecified atrial fibrillation: Secondary | ICD-10-CM | POA: Diagnosis not present

## 2020-09-02 DIAGNOSIS — Z5181 Encounter for therapeutic drug level monitoring: Secondary | ICD-10-CM | POA: Diagnosis not present

## 2020-09-02 LAB — POCT INR: INR: 1.3 — AB (ref 2.0–3.0)

## 2020-09-02 NOTE — Patient Instructions (Signed)
Pending back injection tomorrow.  Has been off warfarin x 5 days Continue to hold warfarin tonight and restart per Interventional Radiologist Warfarin 1 tablet daily except 1 1/2 tablets on Tuesdays and Fridays Recheck in 2 wks

## 2020-09-03 ENCOUNTER — Ambulatory Visit
Admission: RE | Admit: 2020-09-03 | Discharge: 2020-09-03 | Disposition: A | Discharge status: Home / Self Care | Payer: PPO | Source: Ambulatory Visit | Attending: Family Medicine | Admitting: Family Medicine

## 2020-09-03 ENCOUNTER — Other Ambulatory Visit: Payer: Self-pay

## 2020-09-03 DIAGNOSIS — M47817 Spondylosis without myelopathy or radiculopathy, lumbosacral region: Secondary | ICD-10-CM | POA: Diagnosis not present

## 2020-09-03 DIAGNOSIS — G8929 Other chronic pain: Secondary | ICD-10-CM

## 2020-09-03 MED ORDER — METHYLPREDNISOLONE ACETATE 40 MG/ML INJ SUSP (RADIOLOG
120.0000 mg | Freq: Once | INTRAMUSCULAR | Status: AC
  Administered 2020-09-03: 120 mg via EPIDURAL

## 2020-09-03 MED ORDER — IOPAMIDOL (ISOVUE-M 200) INJECTION 41%
1.0000 mL | Freq: Once | INTRAMUSCULAR | Status: AC
  Administered 2020-09-03: 1 mL via EPIDURAL

## 2020-09-03 NOTE — Discharge Instructions (Signed)
Post Procedure Spinal Discharge Instruction Sheet  1. You may resume a regular diet and any medications that you routinely take (including pain medications).  2. No driving day of procedure.  3. Light activity throughout the rest of the day.  Do not do any strenuous work, exercise, bending or lifting.  The day following the procedure, you can resume normal physical activity but you should refrain from exercising or physical therapy for at least three days thereafter.   Common Side Effects:   Headaches- take your usual medications as directed by your physician.  Increase your fluid intake.  Caffeinated beverages may be helpful.  Lie flat in bed until your headache resolves.   Restlessness or inability to sleep- you may have trouble sleeping for the next few days.  Ask your referring physician if you need any medication for sleep.   Facial flushing or redness- should subside within a few days.   Increased pain- a temporary increase in pain a day or two following your procedure is not unusual.  Take your pain medication as prescribed by your referring physician.   Leg cramps  Please contact our office at [phone] for the following symptoms:  Fever greater than 100 degrees.  Headaches unresolved with medication after 2-3 days.  Increased swelling, pain, or redness at injection site.  YOU MAY RESUME YOUR COUMADIN (WARFARIN) 12 HOURS AFTER PROCEDURE.

## 2020-09-14 ENCOUNTER — Other Ambulatory Visit: Payer: Self-pay

## 2020-09-14 ENCOUNTER — Encounter (INDEPENDENT_AMBULATORY_CARE_PROVIDER_SITE_OTHER): Payer: Self-pay | Admitting: Internal Medicine

## 2020-09-14 ENCOUNTER — Ambulatory Visit (INDEPENDENT_AMBULATORY_CARE_PROVIDER_SITE_OTHER): Payer: PPO | Admitting: Internal Medicine

## 2020-09-14 VITALS — BP 148/89 | HR 76 | Temp 98.4°F | Ht 68.0 in | Wt 153.0 lb

## 2020-09-14 DIAGNOSIS — K5900 Constipation, unspecified: Secondary | ICD-10-CM | POA: Diagnosis not present

## 2020-09-14 DIAGNOSIS — R490 Dysphonia: Secondary | ICD-10-CM | POA: Diagnosis not present

## 2020-09-14 DIAGNOSIS — K5000 Crohn's disease of small intestine without complications: Secondary | ICD-10-CM | POA: Diagnosis not present

## 2020-09-14 DIAGNOSIS — K219 Gastro-esophageal reflux disease without esophagitis: Secondary | ICD-10-CM

## 2020-09-14 MED ORDER — DOCUSATE SODIUM 100 MG PO CAPS: 100.0000 mg | ORAL_CAPSULE | Freq: Every day | ORAL | 0 refills | Status: DC

## 2020-09-14 NOTE — Patient Instructions (Signed)
Colace/stool softener 100 mg by mouth daily at bedtime.  Can increase dose to 200 mg if necessary. Use glycerin and/or Dulcolax suppository on as-needed basis. Stop Pepcid/famotidine for 7 to 10 days.  Hopefully hoarseness will improve.  However if it worsens would consider increasing famotidine dose to twice daily. While off famotidine can use OTC antacid on as-needed basis. Will contact you when copy of planned blood work received from Dr. Edythe Lynn office.

## 2020-09-14 NOTE — Progress Notes (Signed)
Presenting complaint;  Follow-up for small bowel Crohn's disease and GERD.  Database sent subjective:  Patient is 86 year old Caucasian male who is here for scheduled visit accompanied by his wife.  He was last seen on 11/11/2019. He had a tough year last year when he underwent laparotomy for small bowel obstruction secondary to Crohn's disease.  He subsequent developed C. difficile colitis and ended up in nursing home for rehab.  1 year ago he was also treated with metronidazole for small intestine bacterial overgrowth. He remains on oral mesalamine.  He states he is doing well.  He generally has 1 bowel movement daily or every other day.  Lately his stool has been somewhat hard and he has to strain.  As a result he has noted some discomfort or soreness in right lower quadrant.  He denies melena or rectal bleeding nausea or vomiting.  He has bloating intermittently and is using Gas-X once in a while.  None mentioned in his medications.  He is doing less walking because of back problems.  He has had 2 injections in due for third injection. He rarely has heartburn relieved with Tums.  He complains of hoarseness.  He wonders if hoarseness is secondary to famotidine.  He does not have chronic cough or sore throat. His appetite is good.  He has gained 12 pounds since his last visit.   Current Medications: Outpatient Encounter Medications as of 09/14/2020  Medication Sig   acetaminophen (TYLENOL) 500 MG tablet Take 500 mg by mouth every 6 (six) hours as needed.   Bacillus Coagulans-Inulin (ALIGN PREBIOTIC-PROBIOTIC PO) Take by mouth daily.    carvedilol (COREG) 6.25 MG tablet Take 1 tablet (6.25 mg total) by mouth 2 (two) times daily.   diltiazem (CARDIZEM CD) 180 MG 24 hr capsule Take 180 mg by mouth 2 (two) times daily.    famotidine (PEPCID) 40 MG tablet Take 1 tablet (40 mg total) by mouth at bedtime.   levothyroxine (SYNTHROID, LEVOTHROID) 75 MCG tablet Take 75 mcg by mouth daily before  breakfast.   magnesium gluconate (MAGONATE) 500 MG tablet Take 500 mg by mouth 2 (two) times daily.    Melatonin 10 MG TABS Take 10 mg by mouth at bedtime.    mesalamine (PENTASA) 500 MG CR capsule Take 500 mg by mouth 4 (four) times daily.   nitroGLYCERIN (NITROSTAT) 0.4 MG SL tablet Place 0.4 mg under the tongue every 5 (five) minutes as needed. For chest pains. May repeat for up to 3 doses.   simvastatin (ZOCOR) 10 MG tablet Take 10 mg by mouth at bedtime.    warfarin (COUMADIN) 2.5 MG tablet Take 2.5 mg by mouth as directed.   No facility-administered encounter medications on file as of 09/14/2020.     Objective: Blood pressure (!) 148/89, pulse 76, temperature 98.4 F (36.9 C), temperature source Oral, height 5' 8"  (1.727 m), weight 153 lb (69.4 kg). Patient is alert and in no acute distress. Conjunctiva is pink. Sclera is nonicteric Oropharyngeal mucosa is normal. No neck masses or thyromegaly noted. Cardiac exam with irregular rhythm normal S1 and S2. No murmur or gallop noted. Lungs are clear to auscultation. Abdomen is symmetrical.  He has upper midline scar.  Bowel sounds are normal.  Percussion note is normal.  On palpation abdomen is soft and nontender with organomegaly or masses. No LE edema or clubbing noted.  Labs/studies Results:  CBC Latest Ref Rng & Units 04/29/2020 09/25/2019 06/15/2017  WBC 3.8 - 10.8 Thousand/uL - 12.3(H) 6.0  Hemoglobin 13.0 - 17.0 g/dL 13.3 12.2(L) 12.8(L)  Hematocrit 39 - 52 % 39.0 37.1(L) 39.2  Platelets 140 - 400 Thousand/uL - 258 193    CMP Latest Ref Rng & Units 04/29/2020 09/25/2019 12/10/2018  Glucose 70 - 99 mg/dL 95 97 101  BUN 8 - 23 mg/dL 22 15 14   Creatinine 0.61 - 1.24 mg/dL 1.70(H) 0.95 1.40(H)  Sodium 135 - 145 mmol/L 141 137 141  Potassium 3.5 - 5.1 mmol/L 4.2 4.3 4.0  Chloride 98 - 111 mmol/L 105 102 105  CO2 20 - 32 mmol/L - 27 28  Calcium 8.6 - 10.3 mg/dL - 8.4(L) 8.7  Total Protein 6.1 - 8.1 g/dL - 5.7(L) -  Total  Bilirubin 0.2 - 1.2 mg/dL - 0.4 -  Alkaline Phos 40 - 115 U/L - - -  AST 10 - 35 U/L - 16 -  ALT 9 - 46 U/L - 21 -    Hepatic Function Latest Ref Rng & Units 09/25/2019 06/15/2017  Total Protein 6.1 - 8.1 g/dL 5.7(L) 6.2  Albumin 3.6 - 5.1 g/dL - 3.9  AST 10 - 35 U/L 16 14  ALT 9 - 46 U/L 21 10  Alk Phosphatase 40 - 115 U/L - 65  Total Bilirubin 0.2 - 1.2 mg/dL 0.4 0.6  Bilirubin, Direct <=0.2 mg/dL - 0.1    Lab Results  Component Value Date   CRP 6.3 09/25/2019     Assessment:  #1.  Small bowel Crohn's disease.  He is status post bowel resection in October 2020.  His disease activity was assessed with MR enterography in December 2020 raising possibility of small bowel stricture at anastomosis.  He was further evaluated with small bowel follow-through revealing wall thickening to long segment of distal ileum extending to the anastomosis and there was proximal small bowel dilation but no high-grade stricture. He appears to be doing well symptomatically.  He will continue oral mesalamine as long as it is working.  #2.  Hoarseness.  Patient is concerned that hoarseness is caused by famotidine but I believe hoarseness is due to GERD.  Since he is not having frequent heartburn it would be reasonable to stop famotidine and see what happens.  #3.  GERD.  Heartburn well controlled with famotidine.  #4.  Constipation.  We will start him on Colace.  If Colace does not work next step would be low-dose polythene glycol.  Patient instructed not to take stimulant laxatives.   Plan:  Colace 100 to 200 mg p.o. nightly. Use glycerin or Dulcolax suppository on as-needed basis.  Do not go more than 1 day without a bowel movement. Stop Pepcid/famotidine for 7 to 10 days. Can use OTC antacid or Tums for heartburn on as-needed basis. Progress report in few weeks. Will review blood work that he is supposed to have with Dr. Quintin Alto in the next few days. Office visit in 6 months.

## 2020-09-16 ENCOUNTER — Ambulatory Visit (INDEPENDENT_AMBULATORY_CARE_PROVIDER_SITE_OTHER): Payer: PPO | Admitting: *Deleted

## 2020-09-16 DIAGNOSIS — I4891 Unspecified atrial fibrillation: Secondary | ICD-10-CM

## 2020-09-16 DIAGNOSIS — Z5181 Encounter for therapeutic drug level monitoring: Secondary | ICD-10-CM

## 2020-09-16 LAB — POCT INR: INR: 5 — AB (ref 2.0–3.0)

## 2020-09-16 NOTE — Patient Instructions (Signed)
Hold warfarin tonight and tomorrow night then decrease dose to 1 tablet daily Recheck in 1 wk Bleeding and fall precautions discussed with pt and he verbalized understanding

## 2020-09-20 ENCOUNTER — Other Ambulatory Visit (INDEPENDENT_AMBULATORY_CARE_PROVIDER_SITE_OTHER): Payer: Self-pay | Admitting: Internal Medicine

## 2020-09-23 ENCOUNTER — Ambulatory Visit (INDEPENDENT_AMBULATORY_CARE_PROVIDER_SITE_OTHER): Payer: PPO | Admitting: *Deleted

## 2020-09-23 DIAGNOSIS — I4891 Unspecified atrial fibrillation: Secondary | ICD-10-CM | POA: Diagnosis not present

## 2020-09-23 DIAGNOSIS — Z5181 Encounter for therapeutic drug level monitoring: Secondary | ICD-10-CM

## 2020-09-23 LAB — POCT INR: INR: 2 (ref 2.0–3.0)

## 2020-09-23 NOTE — Patient Instructions (Signed)
Increase warfarin to 1 tablet daily except 1 1/2 tablets on Thursdays Recheck in 1 wk

## 2020-10-04 DIAGNOSIS — D649 Anemia, unspecified: Secondary | ICD-10-CM | POA: Diagnosis not present

## 2020-10-04 DIAGNOSIS — R7301 Impaired fasting glucose: Secondary | ICD-10-CM | POA: Diagnosis not present

## 2020-10-04 DIAGNOSIS — I10 Essential (primary) hypertension: Secondary | ICD-10-CM | POA: Diagnosis not present

## 2020-10-04 DIAGNOSIS — R5383 Other fatigue: Secondary | ICD-10-CM | POA: Diagnosis not present

## 2020-10-04 DIAGNOSIS — E039 Hypothyroidism, unspecified: Secondary | ICD-10-CM | POA: Diagnosis not present

## 2020-10-04 DIAGNOSIS — K2100 Gastro-esophageal reflux disease with esophagitis, without bleeding: Secondary | ICD-10-CM | POA: Diagnosis not present

## 2020-10-04 DIAGNOSIS — N183 Chronic kidney disease, stage 3 unspecified: Secondary | ICD-10-CM | POA: Diagnosis not present

## 2020-10-05 ENCOUNTER — Ambulatory Visit (INDEPENDENT_AMBULATORY_CARE_PROVIDER_SITE_OTHER): Payer: PPO | Admitting: *Deleted

## 2020-10-05 DIAGNOSIS — Z5181 Encounter for therapeutic drug level monitoring: Secondary | ICD-10-CM | POA: Diagnosis not present

## 2020-10-05 DIAGNOSIS — I4891 Unspecified atrial fibrillation: Secondary | ICD-10-CM | POA: Diagnosis not present

## 2020-10-05 LAB — POCT INR: INR: 2.3 (ref 2.0–3.0)

## 2020-10-05 NOTE — Patient Instructions (Signed)
Continue warfarin 1 tablet daily except 1 1/2 tablets on Thursdays Recheck in 4 wk

## 2020-10-11 DIAGNOSIS — K56600 Partial intestinal obstruction, unspecified as to cause: Secondary | ICD-10-CM | POA: Diagnosis not present

## 2020-10-11 DIAGNOSIS — R7301 Impaired fasting glucose: Secondary | ICD-10-CM | POA: Diagnosis not present

## 2020-10-11 DIAGNOSIS — N1330 Unspecified hydronephrosis: Secondary | ICD-10-CM | POA: Diagnosis not present

## 2020-10-11 DIAGNOSIS — D649 Anemia, unspecified: Secondary | ICD-10-CM | POA: Diagnosis not present

## 2020-10-11 DIAGNOSIS — I10 Essential (primary) hypertension: Secondary | ICD-10-CM | POA: Diagnosis not present

## 2020-10-15 DIAGNOSIS — I482 Chronic atrial fibrillation, unspecified: Secondary | ICD-10-CM | POA: Diagnosis not present

## 2020-10-15 DIAGNOSIS — I129 Hypertensive chronic kidney disease with stage 1 through stage 4 chronic kidney disease, or unspecified chronic kidney disease: Secondary | ICD-10-CM | POA: Diagnosis not present

## 2020-10-15 DIAGNOSIS — E7849 Other hyperlipidemia: Secondary | ICD-10-CM | POA: Diagnosis not present

## 2020-10-15 DIAGNOSIS — N183 Chronic kidney disease, stage 3 unspecified: Secondary | ICD-10-CM | POA: Diagnosis not present

## 2020-10-21 ENCOUNTER — Telehealth (INDEPENDENT_AMBULATORY_CARE_PROVIDER_SITE_OTHER): Payer: Self-pay | Admitting: Internal Medicine

## 2020-10-21 NOTE — Telephone Encounter (Signed)
Addressed; See other note

## 2020-10-21 NOTE — Telephone Encounter (Signed)
Patient called the office stated Dr Laural Golden was supposed to call him regarding his lab work and pressure he's been having below the waist - please advise - ph# [phone]

## 2020-10-21 NOTE — Telephone Encounter (Signed)
I reviewed patient's blood work from 10/04/2020 which was performed at [facility]. CBC is normal.  WBCs 5.9 and H&H is 14.5 and 43.1 and platelet count is 190 2K.  His LFTs are normal.  BUN is 12 and creatinine 1.24.  TSH serum magnesium levels are normal. Patient complains of pressure but no sharp pain or cramping.  He has no nausea or vomiting.  He still he has to strain even though his stool is not hard. Patient can try glycerin and/or Dulcolax suppository to see if his defecation improves. If not would consider anorectal manometry. For flatulence he will continue use Gas-X. If symptoms change would consider MR enterography or CT which however is authorized by his insurance company. He has office visit in May 2022.

## 2020-10-26 DIAGNOSIS — Z6824 Body mass index (BMI) 24.0-24.9, adult: Secondary | ICD-10-CM | POA: Diagnosis not present

## 2020-10-26 DIAGNOSIS — S39012A Strain of muscle, fascia and tendon of lower back, initial encounter: Secondary | ICD-10-CM | POA: Diagnosis not present

## 2020-11-09 ENCOUNTER — Ambulatory Visit (INDEPENDENT_AMBULATORY_CARE_PROVIDER_SITE_OTHER): Payer: PPO | Admitting: *Deleted

## 2020-11-09 ENCOUNTER — Other Ambulatory Visit: Payer: Self-pay

## 2020-11-09 DIAGNOSIS — Z5181 Encounter for therapeutic drug level monitoring: Secondary | ICD-10-CM

## 2020-11-09 DIAGNOSIS — I4891 Unspecified atrial fibrillation: Secondary | ICD-10-CM

## 2020-11-09 LAB — POCT INR: INR: 4 — AB (ref 2.0–3.0)

## 2020-11-09 NOTE — Patient Instructions (Signed)
Took prednisone 14m x 1 wk then 245mx 1 wk and flexeril as needed.  Finished prednisone yesterday. Hold warfarin tonight, take 1/2 tablet tomorrow night then resume 1 tablet daily except 1 1/2 tablets on Thursdays Recheck in 2 wks

## 2020-11-13 DIAGNOSIS — I482 Chronic atrial fibrillation, unspecified: Secondary | ICD-10-CM | POA: Diagnosis not present

## 2020-11-13 DIAGNOSIS — I129 Hypertensive chronic kidney disease with stage 1 through stage 4 chronic kidney disease, or unspecified chronic kidney disease: Secondary | ICD-10-CM | POA: Diagnosis not present

## 2020-11-13 DIAGNOSIS — E7849 Other hyperlipidemia: Secondary | ICD-10-CM | POA: Diagnosis not present

## 2020-11-13 DIAGNOSIS — I2510 Atherosclerotic heart disease of native coronary artery without angina pectoris: Secondary | ICD-10-CM | POA: Diagnosis not present

## 2020-11-13 DIAGNOSIS — N183 Chronic kidney disease, stage 3 unspecified: Secondary | ICD-10-CM | POA: Diagnosis not present

## 2020-11-16 ENCOUNTER — Telehealth (INDEPENDENT_AMBULATORY_CARE_PROVIDER_SITE_OTHER): Payer: Self-pay | Admitting: Internal Medicine

## 2020-11-16 ENCOUNTER — Encounter (INDEPENDENT_AMBULATORY_CARE_PROVIDER_SITE_OTHER): Payer: Self-pay

## 2020-11-16 ENCOUNTER — Ambulatory Visit: Payer: PPO | Admitting: Urology

## 2020-11-16 DIAGNOSIS — N401 Enlarged prostate with lower urinary tract symptoms: Secondary | ICD-10-CM

## 2020-11-16 NOTE — Telephone Encounter (Signed)
Patient called the office stated he has been up all night vomiting - wanted to know if Dr Laural Golden wants him to come in or can he call something into his pharmacy - please advise - ph# [phone]

## 2020-11-16 NOTE — Telephone Encounter (Addendum)
Per Dr. Laural Golden patient may have just ate something that did not agree with him. He recommended that the patient go on clear liquid diet today and can advance his diet tomorrow. After telling the patient this he states that he had been drinking lactose free milk and Saturday he could not find any and he drank it and thinks this may have contributed to the sickness. Per Dr. Laural Golden if no better in a few days call the office back. Patient states understanding.  I spoke with the patient he states he ate a banana sandwich on Saturday and had been bloated and gassy since, but last night he started having some nausea and vomiting (x 4) along with Diarrhea at the same time. He states while he sat on the toilet he had a emesis bag and was vomiting in it. He states he has a history of crohn's and was wandering if this is a flare. He has no fever, and has not seen any blood in stool or vomitus.He has not ate as of yet this am,but has been able to keep water down. Patient would like to know if he needs to be seen or If any medication needs to be called in he would like it to go to [place].

## 2020-11-17 ENCOUNTER — Other Ambulatory Visit: Payer: Self-pay | Admitting: Neurosurgery

## 2020-11-17 ENCOUNTER — Other Ambulatory Visit: Payer: Self-pay | Admitting: Cardiology

## 2020-11-17 ENCOUNTER — Telehealth: Payer: Self-pay | Admitting: Cardiology

## 2020-11-17 ENCOUNTER — Other Ambulatory Visit: Payer: Self-pay | Admitting: Family Medicine

## 2020-11-17 ENCOUNTER — Other Ambulatory Visit: Payer: Self-pay | Admitting: Student

## 2020-11-17 DIAGNOSIS — M545 Low back pain, unspecified: Secondary | ICD-10-CM

## 2020-11-17 NOTE — Telephone Encounter (Signed)
   [facility] Pre-operative Risk Assessment    HEARTCARE STAFF: - Please ensure there is not already an duplicate clearance open for this procedure. - Under Visit Info/Reason for Call, type in Other and utilize the format Clearance MM/DD/YY or Clearance TBD. Do not use dashes or single digits. - If request is for dental extraction, please clarify the # of teeth to be extracted.  Request for surgical clearance:  1. What type of surgery is being performed? Lumbar Epidural  2. When is this surgery scheduled? TBD  3. What type of clearance is required (medical clearance vs. Pharmacy clearance to hold med vs. Both)? Pharmacy  4. Are there any medications that need to be held prior to surgery and how long?Warfarin for four days.  5. Practice name and name of physician performing surgery? [facility]  6. What is the office phone number? [phone]   7.   What is the office fax number? [phone]  8.   Anesthesia type (None, local, MAC, general) ? None  INR needed the afternoon or morning of appt.   Alvin Critchley 11/17/2020, 11:28 AM  _________________________________________________________________   (provider comments below)

## 2020-11-17 NOTE — Telephone Encounter (Signed)
   Primary Cardiologist: Rozann Lesches, MD  Chart reviewed and patient contacted today by phone as part of pre-operative protocol coverage. Given past medical history and time since last visit, based on ACC/AHA guidelines, [name] would be at acceptable risk for the planned procedure without further cardiovascular testing.   Ok to hold warfarin 4 days pre op (and the morning of procedure) and resume as soon as safe post op. The patient is aware of these recommendations.   Based on prior recommendations Ok to hold warfarin 4 days pre op.  The patient was advised that if he develops new symptoms prior to surgery to contact our office to arrange for a follow-up visit, and he verbalized understanding.  I will route this recommendation to the requesting party via Epic fax function and remove from pre-op pool.  Please call with questions.  Kerin Ransom, PA-C 11/17/2020, 1:29 PM

## 2020-11-18 ENCOUNTER — Other Ambulatory Visit (INDEPENDENT_AMBULATORY_CARE_PROVIDER_SITE_OTHER): Payer: Self-pay

## 2020-11-18 ENCOUNTER — Other Ambulatory Visit (INDEPENDENT_AMBULATORY_CARE_PROVIDER_SITE_OTHER): Payer: Self-pay | Admitting: Internal Medicine

## 2020-11-18 DIAGNOSIS — K5000 Crohn's disease of small intestine without complications: Secondary | ICD-10-CM

## 2020-11-18 DIAGNOSIS — R197 Diarrhea, unspecified: Secondary | ICD-10-CM

## 2020-11-18 DIAGNOSIS — K52838 Other microscopic colitis: Secondary | ICD-10-CM

## 2020-11-18 DIAGNOSIS — Z79899 Other long term (current) drug therapy: Secondary | ICD-10-CM

## 2020-11-18 MED ORDER — CIPROFLOXACIN HCL 500 MG PO TABS: 500.0000 mg | ORAL_TABLET | Freq: Two times a day (BID) | ORAL | 0 refills | Status: DC

## 2020-11-18 MED ORDER — BUDESONIDE 3 MG PO CPEP: 3.0000 mg | ORAL_CAPSULE | Freq: Every day | ORAL | 0 refills | Status: DC

## 2020-11-18 MED ORDER — METRONIDAZOLE 250 MG PO TABS: 250.0000 mg | ORAL_TABLET | Freq: Three times a day (TID) | ORAL | 0 refills | Status: DC

## 2020-11-18 MED ORDER — PREDNISONE 10 MG PO TABS: 30.0000 mg | ORAL_TABLET | Freq: Every day | ORAL | 0 refills | Status: DC

## 2020-11-18 NOTE — Telephone Encounter (Signed)
Patient called back today he states he is not much better. He thinks he is having a crohn's flare. He says he has some stomach growling, loose stools x 5 yesterday, decreased appetite, acid reflux coming up in his throat. He takes famotidine one po QHS. No abdominal pain,but he states he has some soreness near umbilical region. No fevers, no dark or bloody stools noticed. If any thing needs to be called in for the patient he would like for this to be sent to [facility]. Please advise.

## 2020-11-18 NOTE — Progress Notes (Signed)
For desonide's discontinued because of high co-pay.  Will use prednisone  Prednisone schedule is as follows  30 mg daily for 1 week 25 mg daily for 1 week 20 mg daily for 1 week 15 mg daily for 1 week 10 mg daily for 1 week 5 mg daily for 1 week and stop

## 2020-11-18 NOTE — Telephone Encounter (Signed)
I agree patient symptoms are suggestive of relapse. He has small bowel Crohn's disease with stricture.  Infection is still a possibility   Wiill do the following  Cipro 500 mg by mouth twice daily for 10 days. Metronidazole to 50 mg by mouth 3 times a day after every meal for 10 days. Begin budesonide at a dose of 9 mg daily for 2 weeks followed by 6 mg daily for 2 weeks and then 3 mg daily for 2 weeks. GI pathogen panel. Office visit next week.  Patient is on warfarin.  He should reduce warfarin dose to half and have his physician check his INR on Monday

## 2020-11-18 NOTE — Telephone Encounter (Addendum)
Matt from [facility] called back and states the patient prefers to not take the prednisone and Catalina Antigua is able to get the price down for the budesonide. I gave Catalina Antigua the directions for the budesonide and he will fill this for the patient instead of the prednisone.   For desonide's discontinued because of high co-pay.  Will use prednisone  Prednisone schedule is as follows  30 mg daily for 1 week 25 mg daily for 1 week 20 mg daily for 1 week 15 mg daily for 1 week 10 mg daily for 1 week 5 mg daily for 1 week and stop         Electronically signed by Rogene Houston, MD at 11/18/2020 10:52 AM   Matt from [facility] called back to report that the budesonide was too expensive for the patient at $160.00, and would like something else sent in to them instead. Per Dr. Laural Golden he will send in prednisone for this patient to [facility].

## 2020-11-18 NOTE — Telephone Encounter (Signed)
Patient is aware of all, he and his wife state understanding regarding all, and they have an appointment next week with Dr. Laural Golden

## 2020-11-19 ENCOUNTER — Encounter (HOSPITAL_COMMUNITY): Payer: Self-pay

## 2020-11-19 ENCOUNTER — Other Ambulatory Visit: Payer: Self-pay

## 2020-11-19 ENCOUNTER — Emergency Department (HOSPITAL_COMMUNITY)
Admission: EM | Admit: 2020-11-19 | Discharge: 2020-11-19 | Disposition: A | Discharge status: Home / Self Care | Payer: PPO | Attending: Emergency Medicine | Admitting: Emergency Medicine

## 2020-11-19 ENCOUNTER — Emergency Department (HOSPITAL_COMMUNITY): Payer: PPO

## 2020-11-19 DIAGNOSIS — I10 Essential (primary) hypertension: Secondary | ICD-10-CM | POA: Diagnosis not present

## 2020-11-19 DIAGNOSIS — R1110 Vomiting, unspecified: Secondary | ICD-10-CM | POA: Diagnosis not present

## 2020-11-19 DIAGNOSIS — I5032 Chronic diastolic (congestive) heart failure: Secondary | ICD-10-CM | POA: Diagnosis not present

## 2020-11-19 DIAGNOSIS — E860 Dehydration: Secondary | ICD-10-CM | POA: Diagnosis not present

## 2020-11-19 DIAGNOSIS — R1084 Generalized abdominal pain: Secondary | ICD-10-CM | POA: Insufficient documentation

## 2020-11-19 DIAGNOSIS — I2510 Atherosclerotic heart disease of native coronary artery without angina pectoris: Secondary | ICD-10-CM | POA: Diagnosis not present

## 2020-11-19 DIAGNOSIS — R197 Diarrhea, unspecified: Secondary | ICD-10-CM | POA: Diagnosis not present

## 2020-11-19 DIAGNOSIS — Z7901 Long term (current) use of anticoagulants: Secondary | ICD-10-CM | POA: Diagnosis not present

## 2020-11-19 DIAGNOSIS — R531 Weakness: Secondary | ICD-10-CM | POA: Insufficient documentation

## 2020-11-19 DIAGNOSIS — R110 Nausea: Secondary | ICD-10-CM | POA: Insufficient documentation

## 2020-11-19 DIAGNOSIS — R109 Unspecified abdominal pain: Secondary | ICD-10-CM | POA: Diagnosis not present

## 2020-11-19 DIAGNOSIS — Z79899 Other long term (current) drug therapy: Secondary | ICD-10-CM | POA: Diagnosis not present

## 2020-11-19 DIAGNOSIS — E039 Hypothyroidism, unspecified: Secondary | ICD-10-CM | POA: Insufficient documentation

## 2020-11-19 DIAGNOSIS — R0602 Shortness of breath: Secondary | ICD-10-CM | POA: Diagnosis not present

## 2020-11-19 DIAGNOSIS — R5381 Other malaise: Secondary | ICD-10-CM | POA: Diagnosis not present

## 2020-11-19 DIAGNOSIS — K219 Gastro-esophageal reflux disease without esophagitis: Secondary | ICD-10-CM | POA: Insufficient documentation

## 2020-11-19 DIAGNOSIS — R000 Tachycardia, unspecified: Secondary | ICD-10-CM | POA: Diagnosis not present

## 2020-11-19 LAB — COMPREHENSIVE METABOLIC PANEL
ALT: 34 U/L (ref 0–44)
AST: 24 U/L (ref 15–41)
Albumin: 3.6 g/dL (ref 3.5–5.0)
Alkaline Phosphatase: 67 U/L (ref 38–126)
Anion gap: 10 (ref 5–15)
BUN: 15 mg/dL (ref 8–23)
CO2: 26 mmol/L (ref 22–32)
Calcium: 9.5 mg/dL (ref 8.9–10.3)
Chloride: 100 mmol/L (ref 98–111)
Creatinine, Ser: 1.35 mg/dL — ABNORMAL HIGH (ref 0.61–1.24)
GFR, Estimated: 51 mL/min — ABNORMAL LOW (ref >60)
Glucose, Bld: 132 mg/dL — ABNORMAL HIGH (ref 70–99)
Potassium: 3.5 mmol/L (ref 3.5–5.1)
Sodium: 136 mmol/L (ref 135–145)
Total Bilirubin: 1.1 mg/dL (ref 0.3–1.2)
Total Protein: 6.5 g/dL (ref 6.5–8.1)

## 2020-11-19 LAB — URINALYSIS, ROUTINE W REFLEX MICROSCOPIC
Bacteria, UA: NONE SEEN
Bilirubin Urine: NEGATIVE
Glucose, UA: NEGATIVE mg/dL
Ketones, ur: NEGATIVE mg/dL
Leukocytes,Ua: NEGATIVE
Nitrite: NEGATIVE
Protein, ur: NEGATIVE mg/dL
Specific Gravity, Urine: 1.009 (ref 1.005–1.030)
pH: 6 (ref 5.0–8.0)

## 2020-11-19 LAB — C DIFFICILE (CDIFF) QUICK SCRN (NO PCR REFLEX)
C Diff antigen: POSITIVE — AB
C Diff interpretation: DETECTED
C Diff toxin: POSITIVE — AB

## 2020-11-19 LAB — CBC WITH DIFFERENTIAL/PLATELET
Abs Immature Granulocytes: 0.05 10*3/uL (ref 0.00–0.07)
Basophils Absolute: 0 10*3/uL (ref 0.0–0.1)
Basophils Relative: 0 %
Eosinophils Absolute: 0.1 10*3/uL (ref 0.0–0.5)
Eosinophils Relative: 1 %
HCT: 43.7 % (ref 39.0–52.0)
Hemoglobin: 14.5 g/dL (ref 13.0–17.0)
Immature Granulocytes: 1 %
Lymphocytes Relative: 15 %
Lymphs Abs: 1.4 10*3/uL (ref 0.7–4.0)
MCH: 31 pg (ref 26.0–34.0)
MCHC: 33.2 g/dL (ref 30.0–36.0)
MCV: 93.6 fL (ref 80.0–100.0)
Monocytes Absolute: 0.8 10*3/uL (ref 0.1–1.0)
Monocytes Relative: 9 %
Neutro Abs: 6.9 10*3/uL (ref 1.7–7.7)
Neutrophils Relative %: 74 %
Platelets: 199 10*3/uL (ref 150–400)
RBC: 4.67 MIL/uL (ref 4.22–5.81)
RDW: 14 % (ref 11.5–15.5)
WBC: 9.4 10*3/uL (ref 4.0–10.5)
nRBC: 0 % (ref 0.0–0.2)

## 2020-11-19 LAB — MAGNESIUM: Magnesium: 1.5 mg/dL — ABNORMAL LOW (ref 1.7–2.4)

## 2020-11-19 LAB — LIPASE, BLOOD: Lipase: 42 U/L (ref 11–51)

## 2020-11-19 LAB — TROPONIN I (HIGH SENSITIVITY): Troponin I (High Sensitivity): 5 ng/L (ref <18)

## 2020-11-19 MED ORDER — SODIUM CHLORIDE 0.9 % IV BOLUS
1000.0000 mL | Freq: Once | INTRAVENOUS | Status: AC
  Administered 2020-11-19: 1000 mL via INTRAVENOUS

## 2020-11-19 MED ORDER — MAGNESIUM SULFATE 2 GM/50ML IV SOLN
2.0000 g | Freq: Once | INTRAVENOUS | Status: AC
  Administered 2020-11-19: 2 g via INTRAVENOUS
  Filled 2020-11-19: qty 50

## 2020-11-19 MED ORDER — IOHEXOL 300 MG/ML  SOLN
100.0000 mL | Freq: Once | INTRAMUSCULAR | Status: AC | PRN
  Administered 2020-11-19: 100 mL via INTRAVENOUS

## 2020-11-19 NOTE — ED Notes (Signed)
Pt's wife forgot to bring stool sample form home per request of Dr Langston Masker.  Wife and pt to return to ED to bring sample back.  Dr Langston Masker aware.

## 2020-11-19 NOTE — ED Provider Notes (Signed)
I called the patient back about his positive C Diff test.  With no leukocytosis and Cr < 1.5., I suspect this is a mild case.  I advised starting vancomycin 125 mg PO QID for 10 days and continuing flagly, but STOPPING cipro.  He and his wife verbalized understanding.  They have a GI appointment Wednesday with Dr. Laural Golden - he can tailor the antibiotics further at that point if he feels necessary.  Script called into [facility], and patient informed.   Wyvonnia Dusky, MD 11/19/20 1640

## 2020-11-19 NOTE — ED Provider Notes (Signed)
[facility] Provider Note   CSN: [id number] Arrival date & time: 11/19/20  9509     History Chief Complaint  Patient presents with  . Weakness    [name] is a 87 y.o. male with history of coronary disease, A. fib, Crohn's disease on prednisone, SBO s/p laparascopic bowel resection, C. Diff colitis, presenting to the emergency department abdominal pain, diarrhea nausea.  He reports onset of symptoms 4 days ago on Monday.  He says he has been nauseated and had a poor appetite.  He had one episode of vomiting 3 days ago, but otherwise had been able to eat or drink.  He has persistent nonbloody diarrhea, formed "loose stools" about 5 times per day.   Yesterday he saw his GI doctor, Dr Laural Golden.  Per medical records Dr Laural Golden has the patient on prednisone as a taper and started him on cipro + flagyl for 10 days.  Patient reports he provided stool sample at home yesterday for testing.  He describes diffuse nondescript abdominal pain mostly with diarrhea.  He is currently asymptomatic does not have nausea and does not feel he needs any medications.  Patient denies chest pain.  Denies cough, congestion, headache, sore throat.  He reports he has had the COVID vaccines.  Medical record review - MR entero abdomen 10/09/19 - extensive bladder wall trabeculations with multiple diverticula, small hiatal hernia, colonic diverticula, no AAA  PSHx:  Partial colectomy, cholecystectomy  HPI     Past Medical History:  Diagnosis Date  . Anemia   . Atrial fibrillation ([place])   . BPH (benign prostatic hyperplasia)   . CAD (coronary artery disease)    Catheterization 2004, mild/moderate nonobstructive disease  /   nuclear, 2007, small inferior scar // no ischemia  . Crohn's disease ([place])   . Elevated PSA   . GERD (gastroesophageal reflux disease)   . History of kidney stones   . History of pneumonia   . Hypothyroidism   . Mitral regurgitation   . SBO (small bowel obstruction)  ([place])   . Urinary retention   . Wears glasses     Patient Active Problem List   Diagnosis Date Noted  . Hoarseness 09/14/2020  . Enlarged prostate with urinary obstruction 04/29/2020  . Chronic diastolic heart failure ([place]) 02/17/2020  . Constipation 11/11/2019  . Crohn's disease ([place]) 09/25/2019  . Flatulence 09/25/2019  . Shortness of breath 05/06/2015  . Hip pain 07/27/2014  . Upper airway cough syndrome 03/22/2014  . Restrictive lung disease 03/22/2014  . Encounter for therapeutic drug monitoring 12/26/2013  . Hx SBO 09/18/2011  . Carotid bruit   . Fatigue   . CAD (coronary artery disease)   . Atrial fibrillation ([place])   . GERD (gastroesophageal reflux disease)   . Clot   . Mitral regurgitation   . Warfarin anticoagulation   . Dizziness 03/16/2010  . Nausea vomiting and diarrhea 01/14/2010  . Ejection fraction 02/13/2009    Past Surgical History:  Procedure Laterality Date  . AGILE CAPSULE  10/18/2011   Procedure: AGILE CAPSULE;  Surgeon: Rogene Houston, MD;  Location: [facility];  Service: Endoscopy;  Laterality: N/A;  730  . CARDIAC CATHETERIZATION  2004  . CHOLECYSTECTOMY  2010   Dr. Anthony Sar  . COLONOSCOPY  2008   [facility]  . COLONOSCOPY N/A 03/23/2016   Procedure: COLONOSCOPY;  Surgeon: Rogene Houston, MD;  Location: [facility];  Service: Endoscopy;  Laterality: N/A;  1:00  . CYSTOSCOPY  WITH INSERTION OF UROLIFT    . ESOPHAGOGASTRODUODENOSCOPY  11/08/2011   Procedure: ESOPHAGOGASTRODUODENOSCOPY (EGD);  Surgeon: Rogene Houston, MD;  Location: [facility];  Service: Endoscopy;  Laterality: N/A;  300  . POLYPECTOMY  03/23/2016   Procedure: POLYPECTOMY;  Surgeon: Rogene Houston, MD;  Location: [facility];  Service: Endoscopy;;  Cecal polyp removed via cold forceps recto-sigmoid polyp removed via cold snare  . TRANSURETHRAL RESECTION OF PROSTATE N/A 04/29/2020   Procedure: TRANSURETHRAL RESECTION OF THE PROSTATE (TURP);  Surgeon: Franchot Gallo, MD;   Location: [facility];  Service: Urology;  Laterality: N/A;  30 MINS       Family History  Problem Relation Age of Onset  . Colon cancer Mother   . Heart disease Father   . Stroke Brother   . Healthy Daughter     Social History   Tobacco Use  . Smoking status: Never Smoker  . Smokeless tobacco: Never Used  Vaping Use  . Vaping Use: Never used  Substance Use Topics  . Alcohol use: No    Alcohol/week: 0.0 standard drinks  . Drug use: No    Home Medications Prior to Admission medications   Medication Sig Start Date End Date Taking? Authorizing Provider  acetaminophen (TYLENOL) 500 MG tablet Take 500 mg by mouth every 6 (six) hours as needed.    [provider]  Bacillus Coagulans-Inulin (ALIGN PREBIOTIC-PROBIOTIC PO) Take by mouth daily.     [provider]  budesonide (ENTOCORT EC) 3 MG 24 hr capsule Take 1 capsule (3 mg total) by mouth daily. 11/18/20   Rogene Houston, MD  carvedilol (COREG) 6.25 MG tablet Take 1 tablet (6.25 mg total) by mouth 2 (two) times daily. 08/20/20   Satira Sark, MD  ciprofloxacin (CIPRO) 500 MG tablet Take 1 tablet (500 mg total) by mouth 2 (two) times daily. 11/18/20   Rogene Houston, MD  diltiazem (CARDIZEM CD) 180 MG 24 hr capsule Take 180 mg by mouth 2 (two) times daily.     [provider]  docusate sodium (COLACE) 100 MG capsule Take 1 capsule (100 mg total) by mouth at bedtime. 09/14/20   Rogene Houston, MD  famotidine (PEPCID) 40 MG tablet Take 1 tablet (40 mg total) by mouth at bedtime. 03/09/20   Rehman, Mechele Dawley, MD  levothyroxine (SYNTHROID, LEVOTHROID) 75 MCG tablet Take 75 mcg by mouth daily before breakfast.    [provider]  magnesium gluconate (MAGONATE) 500 MG tablet Take 500 mg by mouth 2 (two) times daily.     [provider]  Melatonin 10 MG TABS Take 10 mg by mouth at bedtime.     [provider]  metroNIDAZOLE (FLAGYL) 250 MG tablet Take 1 tablet (250  mg total) by mouth 3 (three) times daily. 11/18/20   Rehman, Mechele Dawley, MD  nitroGLYCERIN (NITROSTAT) 0.4 MG SL tablet Place 0.4 mg under the tongue every 5 (five) minutes as needed. For chest pains. May repeat for up to 3 doses.    [provider]  PENTASA 500 MG CR capsule TAKE ONE CAPSULE BY MOUTH FOUR TIMES DAILY 09/20/20   Rehman, Mechele Dawley, MD  simvastatin (ZOCOR) 10 MG tablet Take 10 mg by mouth at bedtime.     [provider]  warfarin (COUMADIN) 2.5 MG tablet Take 2.5 mg by mouth as directed.    [provider]    Allergies    Patient has no known allergies.  Review of Systems   Review of Systems  Constitutional: Positive for appetite change and fatigue. Negative for chills and fever.  HENT: Negative for ear pain and sore throat.   Eyes: Negative for pain and visual disturbance.  Respiratory: Negative for cough and shortness of breath.   Cardiovascular: Negative for chest pain and palpitations.  Gastrointestinal: Positive for abdominal pain, diarrhea, nausea and vomiting. Negative for blood in stool.  Genitourinary: Negative for dysuria and hematuria.  Musculoskeletal: Negative for arthralgias and back pain.  Skin: Negative for color change and rash.  Neurological: Negative for syncope and headaches.  All other systems reviewed and are negative.   Physical Exam Updated Vital Signs BP (!) 137/92   Pulse 70   Temp 98.7 F (37.1 C) (Oral)   Resp 17   Ht 5' 8"  (1.727 m)   Wt 70.3 kg   SpO2 98%   BMI 23.57 kg/m   Physical Exam Constitutional:      General: He is not in acute distress. HENT:     Head: Normocephalic and atraumatic.  Eyes:     Conjunctiva/sclera: Conjunctivae normal.     Pupils: Pupils are equal, round, and reactive to light.  Cardiovascular:     Rate and Rhythm: Normal rate. Rhythm irregular.  Pulmonary:     Effort: Pulmonary effort is normal. No respiratory distress.  Abdominal:     General: There is no distension.      Tenderness: There is no abdominal tenderness.  Skin:    General: Skin is warm and dry.  Neurological:     General: No focal deficit present.     Mental Status: He is alert. Mental status is at baseline.  Psychiatric:        Mood and Affect: Mood normal.        Behavior: Behavior normal.     ED Results / Procedures / Treatments   Labs (all labs ordered are listed, but only abnormal results are displayed) Labs Reviewed  C DIFFICILE (CDIFF) QUICK SCRN (NO PCR REFLEX) - Abnormal; Notable for the following components:      Result Value   C Diff antigen POSITIVE (*)    C Diff toxin POSITIVE (*)    All other components within normal limits  COMPREHENSIVE METABOLIC PANEL - Abnormal; Notable for the following components:   Glucose, Bld 132 (*)    Creatinine, Ser 1.35 (*)    GFR, Estimated 51 (*)    All other components within normal limits  MAGNESIUM - Abnormal; Notable for the following components:   Magnesium 1.5 (*)    All other components within normal limits  URINALYSIS, ROUTINE W REFLEX MICROSCOPIC - Abnormal; Notable for the following components:   Hgb urine dipstick MODERATE (*)    All other components within normal limits  GASTROINTESTINAL PANEL BY PCR, STOOL (REPLACES STOOL CULTURE)  CBC WITH DIFFERENTIAL/PLATELET  LIPASE, BLOOD  TROPONIN I (HIGH SENSITIVITY)    EKG EKG Interpretation  Date/Time:  Friday November 19 2020 07:02:49 EST Ventricular Rate:  84 PR Interval:    QRS Duration: 86 QT Interval:  363 QTC Calculation: 430 R Axis:   -50 Text Interpretation: Atrial fibrillation Ventricular premature complex Abnormal R-wave progression, late transition Inferior infarct, old No STEMI Confirmed by Octaviano Glow [phone]) on 11/19/2020 7:05:11 AM   Radiology CT ABDOMEN PELVIS W CONTRAST  Result Date: 11/19/2020 CLINICAL DATA:  Abdominal pain with vomiting and diarrhea. Diverticulitis versus Crohn's flare. EXAM: CT ABDOMEN AND PELVIS WITH CONTRAST TECHNIQUE:  Multidetector  CT imaging of the abdomen and pelvis was performed using the standard protocol following bolus administration of intravenous contrast. CONTRAST:  158m OMNIPAQUE IOHEXOL 300 MG/ML  SOLN COMPARISON:  MR enterography 09/29/2019 FINDINGS: Lower chest:  Small fatty hiatal hernia. Hepatobiliary: Tiny cystic density in the right liver.Cholecystectomy. No bile duct dilatation. Pancreas: Generalized atrophy. Spleen: Simple medial cyst. Adrenals/Urinary Tract: Negative adrenals. No hydronephrosis or stone. Bilateral cortical atrophy/thinning. Small bilateral renal cystic densities. Moderate distension of the bladder. There are chronic cellules with diverticula demonstrated on prior MRI. Stomach/Bowel: Dilated small bowel with fluid levels leading to a transition point at the level of the ilium in the right abdomen. This is just beyond a patent enteroenteric anastomosis and in the region of mild mesenteric scarring. The same appearance was seen on prior. No evidence of acute inflammation. There is high-density material, possibly pills, clustered in the proximal colon. No appendicitis. Vascular/Lymphatic: Atheromatous calcification no mass or adenopathy. Reproductive:Trans urethral resection of prostate since prior, per the chart with asymmetric tissue along the right aspect of the prostate. Other: No ascites or pneumoperitoneum. Musculoskeletal: No acute abnormalities. Ordinary degenerative disease. IMPRESSION: Chronic/recurrent small-bowel obstruction at the level of the ilium, unchanged from December 2020 MR enterography. This may relate to fibrostenosis from the patient's history of Crohn's disease. No evidence of active inflammation. Electronically Signed   By: JMonte FantasiaM.D.   On: 11/19/2020 09:04    Procedures Procedures   Medications Ordered in ED Medications  sodium chloride 0.9 % bolus 1,000 mL (0 mLs Intravenous Stopped 11/19/20 1017)  iohexol (OMNIPAQUE) 300 MG/ML solution 100 mL (100 mLs  Intravenous Contrast Given 11/19/20 0836)  magnesium sulfate IVPB 2 g 50 mL (0 g Intravenous Stopped 11/19/20 1052)    ED Course  I have reviewed the triage vital signs and the nursing notes.  Pertinent labs & imaging results that were available during my care of the patient were reviewed by me and considered in my medical decision making (see chart for details).  This patient presents to the Emergency Department with complaint of abdominal pain. This involves an extensive number of treatment options, and is a complaint that carries with it a high risk of complications and morbidity.  The differential diagnosis includes gastritis vs SBO vs diverticulitis vs cystitis vs viral gastroenteritis vs IBD vs other  Less likely AAA with no known disease on MR imaging in 2020 Hx of cholecystectomy - unlikely biliary disease   I ordered, reviewed, and interpreted labs, including CMP with Cr 1.35 near baseline, UA without sing of infection, trop 5 (lower suspicion for ACS), WBC 9.4, hgb normal.  Mg low at 1.6 I ordered medication IV magnesium for hypomag and IV fluids for hydration. I ordered imaging studies which included ct abdomen  I independently visualized and interpreted imaging and agree with radiologist report. ECG reviewed personally showing no significant changes from prior tracing, no acute ischemic findings Previous records obtained and reviewed showing prior hospitalization course and MR imaging of the abdomen  I consulted GI and discussed lab and imaging findings.  I spoke to Dr CAbbey Chatterswho recommended GI studies collection here if able, and felt the patient could be discharged on his current antibiotics if he was tolerating PO and didn't show signs of obstruction. The patient could drink and eat here.  The patient wanted to go home.  As he had no nausea, vomiting, abd distension or pain, I felt this was unlikely an SBO.  We collected stool studies.  He has  an appointment in 5 days with Dr Laural Golden -  this is reasonable follow up.  At this time this seems less likely AAA, ACS, or acute abdominal infection or surgical emergency.  His wife is coming to get him.   Final Clinical Impression(s) / ED Diagnoses Final diagnoses:  Weakness    Rx / DC Orders ED Discharge Orders    None       Jolanda Mccann, Carola Rhine, MD 11/19/20 1757

## 2020-11-19 NOTE — ED Notes (Signed)
Tolerating fluids and crackers well.  Denies nausea.

## 2020-11-19 NOTE — ED Notes (Signed)
Given fluids and crackers for trial.

## 2020-11-19 NOTE — ED Triage Notes (Signed)
Pt arrives via EMS with c/o dehydration and weakness. Pt states he has been vomiting since Monday night.

## 2020-11-19 NOTE — Discharge Instructions (Signed)
Follow up with Dr Olevia Perches office next week.  You should continue taking all the medications he prescribed you.    You should stick to a liquid diet for the next 2 days - soups, ensure, boost, and other liquid drinks.    Your CT scan showed signs of a chronic bowel dilation, or partial blockage.  This is likely related to your old surgeries. However, if you find that you cannot poop or pass gas, and you are having worsening abdominal pain, distension, nausea and vomiting, you need to return to the ER.  This can progress into a full bowel obstruction.

## 2020-11-20 LAB — GASTROINTESTINAL PANEL BY PCR, STOOL (REPLACES STOOL CULTURE)

## 2020-11-22 ENCOUNTER — Telehealth (INDEPENDENT_AMBULATORY_CARE_PROVIDER_SITE_OTHER): Payer: Self-pay | Admitting: Internal Medicine

## 2020-11-22 NOTE — Telephone Encounter (Signed)
Patient left voice mail message stating he went to the ED over the weekend - found out he has C-diff - would like to know if he should come in for his appointment on Wednesday 2/9  -  Please advise - ph# [phone]

## 2020-11-22 NOTE — Telephone Encounter (Signed)
Yes please

## 2020-11-22 NOTE — Telephone Encounter (Addendum)
Patient is aware he is schedule to see Dr. Laural Golden on 12/02/2020 at 3 pm. He says he is feeling so much better today.  Patient aware we are going to reschedule the appt for another time. When will be a good time to reschedule this patient ? Please advise.

## 2020-11-22 NOTE — Telephone Encounter (Signed)
Please advise should we cancel this appointment ?

## 2020-11-23 ENCOUNTER — Ambulatory Visit (INDEPENDENT_AMBULATORY_CARE_PROVIDER_SITE_OTHER): Payer: PPO | Admitting: *Deleted

## 2020-11-23 DIAGNOSIS — Z5181 Encounter for therapeutic drug level monitoring: Secondary | ICD-10-CM | POA: Diagnosis not present

## 2020-11-23 DIAGNOSIS — I4891 Unspecified atrial fibrillation: Secondary | ICD-10-CM

## 2020-11-23 LAB — POCT INR: INR: 2.4 (ref 2.0–3.0)

## 2020-11-23 NOTE — Telephone Encounter (Signed)
Crystal could you please call him on 11/25/2020 for a progress report

## 2020-11-23 NOTE — Patient Instructions (Signed)
Being treated for C diff.  See med list Has been taking 1/2 tablet daily except 1 tablet on Thursday since starting last Friday.  Continue that dose and recheck INR on Monday

## 2020-11-24 ENCOUNTER — Ambulatory Visit (INDEPENDENT_AMBULATORY_CARE_PROVIDER_SITE_OTHER): Payer: PPO | Admitting: Internal Medicine

## 2020-11-24 NOTE — Telephone Encounter (Signed)
Noted  

## 2020-11-25 NOTE — Telephone Encounter (Signed)
Patient states he is doing well with no issues.

## 2020-11-29 ENCOUNTER — Ambulatory Visit (INDEPENDENT_AMBULATORY_CARE_PROVIDER_SITE_OTHER): Payer: PPO | Admitting: *Deleted

## 2020-11-29 DIAGNOSIS — Z5181 Encounter for therapeutic drug level monitoring: Secondary | ICD-10-CM

## 2020-11-29 DIAGNOSIS — I4891 Unspecified atrial fibrillation: Secondary | ICD-10-CM | POA: Diagnosis not present

## 2020-11-29 LAB — POCT INR: INR: 2.5 (ref 2.0–3.0)

## 2020-11-29 NOTE — Patient Instructions (Signed)
Being treated for C diff.  Finishes Flagyl and Vancomycin today Start warfarin 1/2 tablet daily except 1 tablet on Tuesday, Thursday and Saturday. Recheck INR on Monday

## 2020-12-02 ENCOUNTER — Ambulatory Visit (INDEPENDENT_AMBULATORY_CARE_PROVIDER_SITE_OTHER): Payer: PPO | Admitting: Internal Medicine

## 2020-12-02 ENCOUNTER — Encounter (INDEPENDENT_AMBULATORY_CARE_PROVIDER_SITE_OTHER): Payer: Self-pay | Admitting: Internal Medicine

## 2020-12-02 ENCOUNTER — Other Ambulatory Visit: Payer: Self-pay

## 2020-12-02 VITALS — BP 142/87 | HR 85 | Temp 98.0°F | Ht 68.0 in | Wt 151.3 lb

## 2020-12-02 DIAGNOSIS — A0472 Enterocolitis due to Clostridium difficile, not specified as recurrent: Secondary | ICD-10-CM | POA: Diagnosis not present

## 2020-12-02 DIAGNOSIS — K50018 Crohn's disease of small intestine with other complication: Secondary | ICD-10-CM

## 2020-12-02 MED ORDER — CHARCOAL 200 MG PO CAPS: 200.0000 mg | ORAL_CAPSULE | Freq: Every day | ORAL | Status: DC

## 2020-12-02 MED ORDER — VANCOMYCIN HCL 125 MG PO CAPS: 125.0000 mg | ORAL_CAPSULE | Freq: Four times a day (QID) | ORAL | 1 refills | Status: DC

## 2020-12-02 NOTE — Patient Instructions (Addendum)
Take 1 charcoal capsule before you go to bed. Remember to take your evening dose of medications at least 2 hours before you take charcoal. Can use glycerin suppository daily if needed to facilitate defecation.  If glycerin suppository does not work can use Dulcolax suppository. No stimulant laxatives by mouth such as Ex-Lax and senna Progress report in 1 week.

## 2020-12-02 NOTE — Progress Notes (Signed)
Presenting complaint;  Follow-up for Crohn's disease. Recent diagnosis of C. difficile diarrhea.  Database and subjective:  Patient is 87 year old Caucasian male was history of small bowel Crohn's disease maintained on oral mesalamine who is doing well when he was last seen on 09/14/2020 when he was complaining of hoarseness and constipation.  Patient was advised to take Colace and if it did not work he could try low-dose polyethylene glycol.  He was also complaining of hoarseness and I felt it was not due to GERD.  His heartburn was well controlled with famotidine.   On his prior presentations I also felt that he has small intestinal bacterial overgrowth since he has dilated small bowel.  Patient called her office on 11/16/2020 stating that he got sick after eating banana sandwich felt bloated and gassy and began to have nausea and vomiting.  He had 4 episodes of vomiting along with nonbloody diarrhea.  He was concerned that his Crohn's disease was flaring up. I recommended full liquid diet, GI pathogen panel as well as Cipro and metronidazole along with the desonide.  Patient was advised to follow-up at coagulation clinic since warfarin dose may have to be adjusted.  Plans were made to see him in the office on 11/24/2020. In the meantime he noted weakness in chest pressure.  He felt he was dehydrated and came to emergency room on 11/19/2020. His CBC was within normal limits.  Comprehensive chemistry panel was pertinent for glucose of 132 and creatinine of 1.35.  He has a history of chronic kidney disease.  He was able to have stool studies while in emergency room.  GI pathogen panel was negative but C. difficile antigen and toxin came back positive.  Please note that patient only had been on antibiotics for 2 days. Abdominal pelvic CT was obtained with contrast revealing dilated loops of small bowel to the level of ileum appeared to be unchanged when compared to MR enterography of December 2020. Patient  was advised to continue metronidazole and he was given prescription for vancomycin for 10 days.  He finished vancomycin 2 days ago.  He states he is already had 5 bowel movements today.  Stool is mushy.  Since he has been on vancomycin he has been having 4-5 stools.  His appetite remains good.  He denies melena or rectal bleeding.  Patient tells me that he went to emergency room because he was having left-sided pressure and he was concerned it may be his heart but in retrospect he feels it was because of excessive flatulence.  His wife tells me that he is having difficulty and having bowel movements and he has to strain a lot.  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 He continues to complain of bloating.  He says simethicone is not helping anymore.  He has not had any episode of nausea or vomiting since he had episode about 17 days ago. He says he has history of hypomagnesemia and has been on magnesium at this dose for past few years.  He did receive a dose of magnesium sulfate while in the emergency room because his magnesium was 1.5. He has lost only 2 pounds since his visit of 09/14/2020.   Current Medications: Outpatient Encounter Medications as of 12/02/2020  Medication Sig  . acetaminophen (TYLENOL) 500 MG tablet Take 500 mg by mouth every 6 (six) hours as needed.  . Bacillus Coagulans-Inulin (ALIGN PREBIOTIC-PROBIOTIC PO) Take by mouth daily.   Marland Kitchen  budesonide (ENTOCORT EC) 3 MG 24 hr capsule Take 1 capsule (3 mg total) by mouth daily.  . carvedilol (COREG) 6.25 MG tablet Take 1 tablet (6.25 mg total) by mouth 2 (two) times daily.  Marland Kitchen diltiazem (CARDIZEM CD) 180 MG 24 hr capsule Take 180 mg by mouth 2 (two) times daily.  Marland Kitchen docusate sodium (COLACE) 100 MG capsule Take 1 capsule (100 mg total) by mouth at bedtime.  . famotidine (PEPCID) 40 MG tablet Take 1 tablet (40 mg total) by mouth at bedtime.  Marland Kitchen levothyroxine (SYNTHROID, LEVOTHROID) 75 MCG tablet Take  75 mcg by mouth daily before breakfast.  . magnesium gluconate (MAGONATE) 500 MG tablet Take 500 mg by mouth 2 (two) times daily.  . Melatonin 10 MG TABS Take 10 mg by mouth at bedtime.   . nitroGLYCERIN (NITROSTAT) 0.4 MG SL tablet Place 0.4 mg under the tongue every 5 (five) minutes as needed. For chest pains. May repeat for up to 3 doses.  Marland Kitchen PENTASA 500 MG CR capsule TAKE ONE CAPSULE BY MOUTH FOUR TIMES DAILY  . simvastatin (ZOCOR) 10 MG tablet Take 10 mg by mouth at bedtime.  Marland Kitchen warfarin (COUMADIN) 2.5 MG tablet Take 2.5 mg by mouth as directed.   No facility-administered encounter medications on file as of 12/02/2020.     Objective: Blood pressure (!) 142/87, pulse 85, temperature 98 F (36.7 C), temperature source Oral, height _0  (1.727 m), weight 151 lb 4.8 oz (68.6 kg). Patient is alert and in no acute distress. He is wearing a mask. He remains with hoarseness. Conjunctiva is pink. Sclera is nonicteric Oropharyngeal mucosa is normal. No neck masses or thyromegaly noted. Cardiac exam with irregular rhythm normal S1 and S2. No murmur or gallop noted. Lungs are clear to auscultation. Abdomen is full but not distended.  He has upper midline scar.  Bowel sounds are present.  On palpation abdomen is soft with mild tenderness in right mid abdomen which is not reproducible.  Percussion note is tympanic.  No organomegaly or masses.  He does not have inguinal herniae. No LE edema or clubbing noted.  Labs/studies Results:  CBC Latest Ref Rng & Units 11/19/2020 04/29/2020 09/25/2019  WBC 4.0 - 10.5 K/uL 9.4 - 12.3(H)  Hemoglobin 13.0 - 17.0 g/dL 14.5 13.3 12.2(L)  Hematocrit 39.0 - 52.0 % 43.7 39.0 37.1(L)  Platelets 150 - 400 K/uL 199 - 258    CMP Latest Ref Rng & Units 11/19/2020 04/29/2020 09/25/2019  Glucose 70 - 99 mg/dL 132(H) 95 97  BUN 8 - 23 mg/dL _1 Creatinine 0.61 - 1.24 mg/dL 1.35(H) 1.70(H) 0.95  Sodium 135 - 145 mmol/L 136 141 137  Potassium 3.5 - 5.1 mmol/L 3.5 4.2  4.3  Chloride 98 - 111 mmol/L 100 105 102  CO2 22 - 32 mmol/L 26 - 27  Calcium 8.9 - 10.3 mg/dL 9.5 - 8.4(L)  Total Protein 6.5 - 8.1 g/dL 6.5 - 5.7(L)  Total Bilirubin 0.3 - 1.2 mg/dL 1.1 - 0.4  Alkaline Phos 38 - 126 U/L 67 - -  AST 15 - 41 U/L 24 - 16  ALT 0 - 44 U/L 34 - 21    Hepatic Function Latest Ref Rng & Units 11/19/2020 09/25/2019 06/15/2017  Total Protein 6.5 - 8.1 g/dL 6.5 5.7(L) 6.2  Albumin 3.5 - 5.0 g/dL 3.6 - 3.9  AST 15 - 41 U/L _2 ALT 0 - 44 U/L 34 21 10  Alk Phosphatase 38 - 126  U/L 67 - 65  Total Bilirubin 0.3 - 1.2 mg/dL 1.1 0.4 0.6  Bilirubin, Direct <=0.2 mg/dL - - 0.1    Lab Results  Component Value Date   CRP 6.3 09/25/2019    Abdominal pelvic CT from 11/19/2020 reviewed. Dilated loops of small bowel down to distal ileum.  There appeared to be dense material with in colon distal to anastomosis which appeared to be pills.  Assessment:  #1.  Small bowel Crohn's disease.  He has known small bowel stricture.  His symptoms are suggestive of relapse but I am not sure as to the cause of his relapse.  It is possible that his nausea vomiting was due to Crohn's disease and not other etiology.  He does not have obstructive symptoms at at this time.  #2.  C. difficile diarrhea.  He does not appear toxic.  He is still having diarrhea.  10 days of vancomycin does not appear to be sufficient and he needs to go back on it and will need to take it for more than 10 days.  #3.  Impaired defecation.  He is having to strain even though his stool is not hard.  He must have pelvic floor dyssynergia.  If the symptoms persist will consider anorectal manometry as it is quite distressing to the patient.  #4.  Flatulence.   Plan:  Decrease budesonide dose to 6 mg daily later this week.  He will drop dose further to 3 mg daily after 2 weeks. Restart vancomycin at a dose of 125 mg p.o. 4 times daily.  Prescription given for 60 doses. Activated charcoal 200 mg p.o. nightly.   Patient advised to take all his other medications at least 2 hours before he takes charcoal and charcoal will make the stool black and he should not be alarmed. He can try glycerin and/or Dulcolax suppository to see if it helps improve defecation. Decrease magnesium to 250 mg p.o. twice daily. Progress report in 1 week. Office visit in 4 weeks.

## 2020-12-06 ENCOUNTER — Ambulatory Visit (INDEPENDENT_AMBULATORY_CARE_PROVIDER_SITE_OTHER): Payer: PPO | Admitting: *Deleted

## 2020-12-06 DIAGNOSIS — I4891 Unspecified atrial fibrillation: Secondary | ICD-10-CM | POA: Diagnosis not present

## 2020-12-06 DIAGNOSIS — Z5181 Encounter for therapeutic drug level monitoring: Secondary | ICD-10-CM | POA: Diagnosis not present

## 2020-12-06 LAB — POCT INR: INR: 1.5 — AB (ref 2.0–3.0)

## 2020-12-06 NOTE — Patient Instructions (Signed)
Being treated for C diff. Back on Vancomycin and activated charcoal x 10 days Pending spinal injection on 2/25.  Took last dose of warfarin yesterday 2/20.  Will recheck INR on 2/24 before procedure.

## 2020-12-08 ENCOUNTER — Telehealth (INDEPENDENT_AMBULATORY_CARE_PROVIDER_SITE_OTHER): Payer: Self-pay | Admitting: *Deleted

## 2020-12-08 NOTE — Telephone Encounter (Signed)
Patient called with his 1 week progress report. He states that he is on his 5th day taking the Vancomycin. His stools are still mushy , 90% of time he has to strain to have the BM. He plans to try the suppository again.  Here are his questions. 1- With his stools being mushy (diarrhea) does he still have the C-Diff? 2- How long will he have to take the Vancomycin? 3- How long does he have to take the Charcoal?

## 2020-12-09 ENCOUNTER — Ambulatory Visit (INDEPENDENT_AMBULATORY_CARE_PROVIDER_SITE_OTHER): Payer: PPO | Admitting: *Deleted

## 2020-12-09 DIAGNOSIS — I4891 Unspecified atrial fibrillation: Secondary | ICD-10-CM

## 2020-12-09 DIAGNOSIS — Z5181 Encounter for therapeutic drug level monitoring: Secondary | ICD-10-CM

## 2020-12-09 LAB — POCT INR: INR: 1.1 — AB (ref 2.0–3.0)

## 2020-12-09 NOTE — Patient Instructions (Signed)
Being treated for C diff. On Vancomycin and activated charcoal x 10 days Pending spinal injection on 2/25.  Warfarin has been on hold since 2/20.  Resume warfarin night of procedure if OK with MD.  Take 1 1/2 tablets x 2 days then resume 1 tablet daily except 1 1/2 tablets on Thursdays. Recheck in 1 wk

## 2020-12-09 NOTE — Telephone Encounter (Signed)
Patient states he used suppository and was able to have bowel movement without straining. He is not having any pain during defecation.  Therefore I doubt that he has anal fissure. He may have pelvic floor dyssynergia.  He will continue to use suppository daily if he needs to. Will do rectal exam at the time of next office visit Patient advised to decrease vancomycin to twice daily. He will call office if stool consistency not back to his baseline in which case we will do follow-up C. difficile testing for antigen and toxin.

## 2020-12-10 ENCOUNTER — Other Ambulatory Visit: Payer: Self-pay

## 2020-12-10 ENCOUNTER — Ambulatory Visit
Admission: RE | Admit: 2020-12-10 | Discharge: 2020-12-10 | Disposition: A | Discharge status: Home / Self Care | Payer: PPO | Source: Ambulatory Visit | Attending: Family Medicine | Admitting: Family Medicine

## 2020-12-10 DIAGNOSIS — M47817 Spondylosis without myelopathy or radiculopathy, lumbosacral region: Secondary | ICD-10-CM | POA: Diagnosis not present

## 2020-12-10 DIAGNOSIS — G8929 Other chronic pain: Secondary | ICD-10-CM

## 2020-12-10 DIAGNOSIS — M545 Low back pain, unspecified: Secondary | ICD-10-CM

## 2020-12-10 MED ORDER — IOPAMIDOL (ISOVUE-M 200) INJECTION 41%: 1.0000 mL | Freq: Once | INTRAMUSCULAR | Status: DC

## 2020-12-10 MED ORDER — METHYLPREDNISOLONE ACETATE 40 MG/ML INJ SUSP (RADIOLOG: 120.0000 mg | Freq: Once | INTRAMUSCULAR | Status: DC

## 2020-12-10 NOTE — Discharge Instructions (Signed)

## 2020-12-13 DIAGNOSIS — I2510 Atherosclerotic heart disease of native coronary artery without angina pectoris: Secondary | ICD-10-CM | POA: Diagnosis not present

## 2020-12-13 DIAGNOSIS — E7849 Other hyperlipidemia: Secondary | ICD-10-CM | POA: Diagnosis not present

## 2020-12-13 DIAGNOSIS — I482 Chronic atrial fibrillation, unspecified: Secondary | ICD-10-CM | POA: Diagnosis not present

## 2020-12-13 DIAGNOSIS — I129 Hypertensive chronic kidney disease with stage 1 through stage 4 chronic kidney disease, or unspecified chronic kidney disease: Secondary | ICD-10-CM | POA: Diagnosis not present

## 2020-12-13 DIAGNOSIS — N183 Chronic kidney disease, stage 3 unspecified: Secondary | ICD-10-CM | POA: Diagnosis not present

## 2020-12-14 ENCOUNTER — Other Ambulatory Visit (INDEPENDENT_AMBULATORY_CARE_PROVIDER_SITE_OTHER): Payer: Self-pay | Admitting: Internal Medicine

## 2020-12-14 DIAGNOSIS — K5000 Crohn's disease of small intestine without complications: Secondary | ICD-10-CM

## 2020-12-16 ENCOUNTER — Ambulatory Visit (INDEPENDENT_AMBULATORY_CARE_PROVIDER_SITE_OTHER): Payer: PPO | Admitting: *Deleted

## 2020-12-16 DIAGNOSIS — I4891 Unspecified atrial fibrillation: Secondary | ICD-10-CM

## 2020-12-16 DIAGNOSIS — Z5181 Encounter for therapeutic drug level monitoring: Secondary | ICD-10-CM | POA: Diagnosis not present

## 2020-12-16 LAB — POCT INR: INR: 2.4 (ref 2.0–3.0)

## 2020-12-16 NOTE — Patient Instructions (Signed)
Being treated for C diff. On Vancomycin / Entocort.  Will finish 3/17 Continue warfarin 1 tablet daily except 1 1/2 tablets on Thursdays. Recheck in 2 wk

## 2020-12-27 ENCOUNTER — Telehealth (INDEPENDENT_AMBULATORY_CARE_PROVIDER_SITE_OTHER): Payer: Self-pay | Admitting: *Deleted

## 2020-12-27 NOTE — Telephone Encounter (Signed)
called [name] about meds hasn't heard from her please call [phone]

## 2020-12-27 NOTE — Telephone Encounter (Signed)
I have talked with Dr. Laural Golden. He will talk with him at his office visit March 17 th. Patient was called phone just rang and rang. Unable to leave a voicemail.

## 2020-12-27 NOTE — Progress Notes (Signed)
History of Present Illness: Here for followup of BPH following Urolift and eventual TURP --7.15.2021. Path--benign 30 gms resected.  He has an excellent stream.  IPSS essentially 0.  He has had no recent issues with gross hematuria or dysuria.  He is not on any medications for his BPH.    Past Medical History:  Diagnosis Date  . Anemia   . Atrial fibrillation ([place])   . BPH (benign prostatic hyperplasia)   . CAD (coronary artery disease)    Catheterization 2004, mild/moderate nonobstructive disease  /   nuclear, 2007, small inferior scar // no ischemia  . Crohn's disease ([place])   . Elevated PSA   . GERD (gastroesophageal reflux disease)   . History of kidney stones   . History of pneumonia   . Hypothyroidism   . Mitral regurgitation   . SBO (small bowel obstruction) ([place])   . Urinary retention   . Wears glasses     Past Surgical History:  Procedure Laterality Date  . AGILE CAPSULE  10/18/2011   Procedure: AGILE CAPSULE;  Surgeon: Rogene Houston, MD;  Location: [facility];  Service: Endoscopy;  Laterality: N/A;  730  . CARDIAC CATHETERIZATION  2004  . CHOLECYSTECTOMY  2010   Dr. Anthony Sar  . COLONOSCOPY  2008   [place]  . COLONOSCOPY N/A 03/23/2016   Procedure: COLONOSCOPY;  Surgeon: Rogene Houston, MD;  Location: [facility];  Service: Endoscopy;  Laterality: N/A;  1:00  . CYSTOSCOPY WITH INSERTION OF UROLIFT    . ESOPHAGOGASTRODUODENOSCOPY  11/08/2011   Procedure: ESOPHAGOGASTRODUODENOSCOPY (EGD);  Surgeon: Rogene Houston, MD;  Location: [facility];  Service: Endoscopy;  Laterality: N/A;  300  . POLYPECTOMY  03/23/2016   Procedure: POLYPECTOMY;  Surgeon: Rogene Houston, MD;  Location: [facility];  Service: Endoscopy;;  Cecal polyp removed via cold forceps recto-sigmoid polyp removed via cold snare  . TRANSURETHRAL RESECTION OF PROSTATE N/A 04/29/2020   Procedure: TRANSURETHRAL RESECTION OF THE PROSTATE (TURP);  Surgeon: Franchot Gallo, MD;  Location[facility];  Service: Urology;  Laterality: N/A;  90 MINS    Home Medications:  Allergies as of 12/28/2020   No Known Allergies     Medication List       Accurate as of December 27, 2020  9:09 PM. If you have any questions, ask your nurse or doctor.        acetaminophen 500 MG tablet Commonly known as: TYLENOL Take 500 mg by mouth every 6 (six) hours as needed.   ALIGN PREBIOTIC-PROBIOTIC PO Take by mouth daily.   budesonide 3 MG 24 hr capsule Commonly known as: ENTOCORT EC Take 1 capsule (3 mg total) by mouth daily.   carvedilol 6.25 MG tablet Commonly known as: COREG Take 1 tablet (6.25 mg total) by mouth 2 (two) times daily.   Charcoal 200 MG Caps Take 200 mg by mouth at bedtime.   diltiazem 180 MG 24 hr capsule Commonly known as: CARDIZEM CD Take 180 mg by mouth 2 (two) times daily.   famotidine 40 MG tablet Commonly known as: PEPCID Take 1 tablet (40 mg total) by mouth at bedtime.   levothyroxine 75 MCG tablet Commonly known as: SYNTHROID Take 75 mcg by mouth daily before breakfast.   magnesium gluconate 500 MG tablet Commonly known as: MAGONATE Take 0.5 tablets (250 mg total) by mouth 2 (two) times daily.   Melatonin 10 MG Tabs Take 10 mg by mouth at bedtime.  nitroGLYCERIN 0.4 MG SL tablet Commonly known as: NITROSTAT Place 0.4 mg under the tongue every 5 (five) minutes as needed. For chest pains. May repeat for up to 3 doses.   Pentasa 500 MG CR capsule Generic drug: mesalamine TAKE ONE CAPSULE BY MOUTH FOUR TIMES DAILY   simvastatin 10 MG tablet Commonly known as: ZOCOR Take 10 mg by mouth at bedtime.   vancomycin 125 MG capsule Commonly known as: VANCOCIN Take 1 capsule (125 mg total) by mouth 4 (four) times daily. What changed: when to take this   warfarin 2.5 MG tablet Commonly known as: COUMADIN Take as directed by the anticoagulation clinic. If you are unsure how to take this medication, talk to your nurse or doctor. Original  instructions: Take 2.5 mg by mouth as directed.       Allergies: No Known Allergies  Family History  Problem Relation Age of Onset  . Colon cancer Mother   . Heart disease Father   . Stroke Brother   . Healthy Daughter     Social History:  reports that he has never smoked. He has never used smokeless tobacco. He reports that he does not drink alcohol and does not use drugs.  ROS: A complete review of systems was performed.  All systems are negative except for pertinent findings as noted.  Physical Exam:  Vital signs in last 24 hours: There were no vitals taken for this visit. Constitutional:  Alert and oriented, No acute distress Cardiovascular: Regular rate  Respiratory: Normal respiratory effort Lymphatic: No lymphadenopathy Neurologic: Grossly intact, no focal deficits Psychiatric: Normal mood and affect  I have reviewed prior pt notes  I have reviewed notes from referring/previous physicians  I have reviewed urinalysis results  I have independently reviewed prior imaging  I have reviewed prior path results    Impression/Assessment:  BPH--better following TURP Plan:  I'll see back on a prn basis

## 2020-12-28 ENCOUNTER — Ambulatory Visit (INDEPENDENT_AMBULATORY_CARE_PROVIDER_SITE_OTHER): Payer: PPO | Admitting: Urology

## 2020-12-28 ENCOUNTER — Encounter: Payer: Self-pay | Admitting: Urology

## 2020-12-28 VITALS — BP 147/70 | HR 89 | Temp 98.1°F | Ht 68.0 in | Wt 150.0 lb

## 2020-12-28 DIAGNOSIS — N401 Enlarged prostate with lower urinary tract symptoms: Secondary | ICD-10-CM

## 2020-12-28 DIAGNOSIS — R338 Other retention of urine: Secondary | ICD-10-CM | POA: Diagnosis not present

## 2020-12-28 LAB — URINALYSIS, ROUTINE W REFLEX MICROSCOPIC
Bilirubin, UA: NEGATIVE
Glucose, UA: NEGATIVE
Leukocytes,UA: NEGATIVE
Nitrite, UA: NEGATIVE
Specific Gravity, UA: 1.02 (ref 1.005–1.030)
Urobilinogen, Ur: 0.2 mg/dL (ref 0.2–1.0)
pH, UA: 6.5 (ref 5.0–7.5)

## 2020-12-28 LAB — MICROSCOPIC EXAMINATION
Bacteria, UA: NONE SEEN
Renal Epithel, UA: NONE SEEN /hpf

## 2020-12-28 NOTE — Progress Notes (Signed)
Urological Symptom Review  Patient is experiencing the following symptoms: Get up at night to urinate Have to strain to urinate   Review of Systems  Gastrointestinal (upper)  : Indigestion/heartburn  Gastrointestinal (lower) : Negative for lower GI symptoms  Constitutional : Negative for symptoms  Skin: Negative for skin symptoms  Eyes: Negative for eye symptoms  Ear/Nose/Throat : Negative for Ear/Nose/Throat symptoms  Hematologic/Lymphatic: Negative for Hematologic/Lymphatic symptoms  Cardiovascular : Negative for cardiovascular symptoms  Respiratory : Negative for respiratory symptoms  Endocrine: Negative for endocrine symptoms  Musculoskeletal: Back pain  Neurological: Negative for neurological symptoms  Psychologic: Negative for psychiatric symptoms

## 2020-12-30 ENCOUNTER — Encounter (INDEPENDENT_AMBULATORY_CARE_PROVIDER_SITE_OTHER): Payer: Self-pay | Admitting: Internal Medicine

## 2020-12-30 ENCOUNTER — Ambulatory Visit (INDEPENDENT_AMBULATORY_CARE_PROVIDER_SITE_OTHER): Payer: PPO | Admitting: Internal Medicine

## 2020-12-30 ENCOUNTER — Other Ambulatory Visit: Payer: Self-pay

## 2020-12-30 VITALS — BP 124/74 | HR 86 | Temp 98.0°F | Ht 68.0 in | Wt 149.6 lb

## 2020-12-30 DIAGNOSIS — K50019 Crohn's disease of small intestine with unspecified complications: Secondary | ICD-10-CM | POA: Diagnosis not present

## 2020-12-30 DIAGNOSIS — R198 Other specified symptoms and signs involving the digestive system and abdomen: Secondary | ICD-10-CM | POA: Diagnosis not present

## 2020-12-30 NOTE — Progress Notes (Signed)
Presenting complaint;  Follow for small bowel Crohn's disease and recent diagnosis of C. difficile colitis.  Database and subjective:  Patient is 87 year old Caucasian male who is here for scheduled visit accompanied by his wife Inez Catalina. He has small bowel Crohn's disease history of GERD and constipation/difficult defecation who was diagnosed with C. difficile colitis last month when he was seen in emergency room.  Just prior to that visit he was being treated for flareup of his Crohn's disease. He was treated with budesonide and vancomycin.  He finished vancomycin last week.  It was decided to treat him for 3 weeks given history of underlying IBD.  He finished budesonide yesterday. On his last visit he was advised to try activated charcoal for flatulence.  He has been taking single dose at bedtime in order to minimize drug interaction. He continues to complain of flatulence.  He had a terrible day yesterday.  He thought charcoal is helping but now he thinks may be not to.  He is having 2-4 stools per day.  For the last 1 week he has been passing small balls or turds.  Prior to that his stool was mushy.  He denies melena or rectal bleeding.  He complains of soreness in right lower quadrant of his abdomen which is not associated with flatulence.  He is doing exercises to strengthen muscles.  He wonders if this is a source of his soreness.  His appetite is good.  His wife wants to know if he could eat cereal or oatmeal.  He is using low-fat lactose-free milk because he feels he may have lactose intolerance. His wife states he is using Fleet suppositories and it helps but today he had 2 bowel movements spontaneously.  He does not have anorectal pain with defecation. His weight is down by 2 pounds since his last visit 1 month ago.  Current Medications: Outpatient Encounter Medications as of 12/30/2020  Medication Sig  . acetaminophen (TYLENOL) 500 MG tablet Take 500 mg by mouth every 6 (six) hours as  needed.  . Bacillus Coagulans-Inulin (ALIGN PREBIOTIC-PROBIOTIC PO) Take by mouth daily.  . carvedilol (COREG) 6.25 MG tablet Take 1 tablet (6.25 mg total) by mouth 2 (two) times daily.  . Charcoal 200 MG CAPS Take 200 mg by mouth at bedtime.  Marland Kitchen diltiazem (CARDIZEM CD) 180 MG 24 hr capsule Take 180 mg by mouth 2 (two) times daily.  . famotidine (PEPCID) 40 MG tablet Take 1 tablet (40 mg total) by mouth at bedtime.  Marland Kitchen levothyroxine (SYNTHROID, LEVOTHROID) 75 MCG tablet Take 75 mcg by mouth daily before breakfast.  . magnesium gluconate (MAGONATE) 500 MG tablet Take 0.5 tablets (250 mg total) by mouth 2 (two) times daily.  . Melatonin 10 MG TABS Take 10 mg by mouth at bedtime.   . nitroGLYCERIN (NITROSTAT) 0.4 MG SL tablet Place 0.4 mg under the tongue every 5 (five) minutes as needed. For chest pains. May repeat for up to 3 doses.  Marland Kitchen PENTASA 500 MG CR capsule TAKE ONE CAPSULE BY MOUTH FOUR TIMES DAILY  . simvastatin (ZOCOR) 10 MG tablet Take 10 mg by mouth at bedtime.  Marland Kitchen warfarin (COUMADIN) 2.5 MG tablet Take 2.5 mg by mouth as directed.  . budesonide (ENTOCORT EC) 3 MG 24 hr capsule Take 1 capsule (3 mg total) by mouth daily. (Patient not taking: No sig reported)  . vancomycin (VANCOCIN) 125 MG capsule Take 1 capsule (125 mg total) by mouth 4 (four) times daily. (Patient not taking: Reported on  12/30/2020)   No facility-administered encounter medications on file as of 12/30/2020.     Objective: Blood pressure 124/74, pulse 86, temperature 98 F (36.7 C), temperature source Oral, height 5' 8"  (1.727 m), weight 149 lb 9.6 oz (67.9 kg). Patient is alert and in no acute distress.   He is wearing a mask. Conjunctiva is pink. Sclera is nonicteric Oropharyngeal mucosa is normal. No neck masses or thyromegaly noted. Cardiac exam with irregular rhythm normal S1 and S2. No murmur or gallop noted. Lungs are clear to auscultation. Abdomen is full but nondistended.  Bowel sounds are normal.  He has  midline scar.  On palpation abdomen is soft and nontender with organomegaly or masses.  Percussion note is tympanic across upper abdomen. He has trace edema around ankles.  Labs/studies Results:  CBC Latest Ref Rng & Units 11/19/2020 04/29/2020 09/25/2019  WBC 4.0 - 10.5 K/uL 9.4 - 12.3(H)  Hemoglobin 13.0 - 17.0 g/dL 14.5 13.3 12.2(L)  Hematocrit 39.0 - 52.0 % 43.7 39.0 37.1(L)  Platelets 150 - 400 K/uL 199 - 258    CMP Latest Ref Rng & Units 11/19/2020 04/29/2020 09/25/2019  Glucose 70 - 99 mg/dL 132(H) 95 97  BUN 8 - 23 mg/dL 15 22 15   Creatinine 0.61 - 1.24 mg/dL 1.35(H) 1.70(H) 0.95  Sodium 135 - 145 mmol/L 136 141 137  Potassium 3.5 - 5.1 mmol/L 3.5 4.2 4.3  Chloride 98 - 111 mmol/L 100 105 102  CO2 22 - 32 mmol/L 26 - 27  Calcium 8.9 - 10.3 mg/dL 9.5 - 8.4(L)  Total Protein 6.5 - 8.1 g/dL 6.5 - 5.7(L)  Total Bilirubin 0.3 - 1.2 mg/dL 1.1 - 0.4  Alkaline Phos 38 - 126 U/L 67 - -  AST 15 - 41 U/L 24 - 16  ALT 0 - 44 U/L 34 - 21    Hepatic Function Latest Ref Rng & Units 11/19/2020 09/25/2019 06/15/2017  Total Protein 6.5 - 8.1 g/dL 6.5 5.7(L) 6.2  Albumin 3.5 - 5.0 g/dL 3.6 - 3.9  AST 15 - 41 U/L 24 16 14   ALT 0 - 44 U/L 34 21 10  Alk Phosphatase 38 - 126 U/L 67 - 65  Total Bilirubin 0.3 - 1.2 mg/dL 1.1 0.4 0.6  Bilirubin, Direct <=0.2 mg/dL - - 0.1    Lab Results  Component Value Date   CRP 6.3 09/25/2019    No labs since his last visit.  Assessment:  #1.  Small bowel Crohn's disease.  He has known ileal disease.  He is status post right hemicolectomy in October 2020.  He has been maintained on oral mesalamine i.e. Pentasa which is bioavailable and small bowel he has been treated with budesonide on more than one occasion.  Based on his CT of 11/19/2020 I do not believe he is in remission.  Therefore other treatment options discussed with patient and his wife.  She wonders if he should be treated with 6-MP.  I believe given current options this is not the best.  This medication  may take 2 to 3 months or longer before peak effect noted in 1 has to worry about side effects.  Biologic therapy would be appropriate if test for HBV and TB are negative.  #2.  Flatulence.  I have felt in the past that he has small intestinal bacterial overgrowth.  He did respond to antibiotic over a year ago.  Recently he did not.  This symptom may be related to small bowel stricture.  He will continue  charcoal at bedtime for now.  #3.  History of C. difficile.  He finished vancomycin last week.  Diarrhea has not relapsed which is reassuring.  #4.  Impaired defecation.  I wonder if he has pelvic floor dyssynergia or rectal atony.  Anorectal manometry may shed further light as to the abnormality.  He will have to be referred to [facility] for the test and he would like to wait for now.  For now he will continue to use suppository daily or as needed.  Plan:  Patient will go to the lab for hepatitis B surface antigen and QuantiFERON TB Gold plus. If these tests are negative we will send request to his insurance for Humira/adalimumab.  He may be on a different agent depending on what is preferred with his insurance carrier. Office visit in 2 months.

## 2020-12-30 NOTE — Patient Instructions (Signed)
Physician will call with results of blood test when completed. Can use suppository every day if needed

## 2020-12-31 ENCOUNTER — Other Ambulatory Visit (INDEPENDENT_AMBULATORY_CARE_PROVIDER_SITE_OTHER): Payer: Self-pay | Admitting: Internal Medicine

## 2021-01-01 LAB — QUANTIFERON-TB GOLD PLUS
Mitogen-NIL: >10 IU/mL
NIL: 0.05 IU/mL
QuantiFERON-TB Gold Plus: NEGATIVE
TB1-NIL: <0 IU/mL
TB2-NIL: <0 IU/mL

## 2021-01-01 LAB — HEPATITIS B SURFACE ANTIGEN: Hepatitis B Surface Ag: NONREACTIVE

## 2021-01-03 ENCOUNTER — Ambulatory Visit (INDEPENDENT_AMBULATORY_CARE_PROVIDER_SITE_OTHER): Payer: PPO | Admitting: *Deleted

## 2021-01-03 DIAGNOSIS — I4891 Unspecified atrial fibrillation: Secondary | ICD-10-CM | POA: Diagnosis not present

## 2021-01-03 DIAGNOSIS — Z5181 Encounter for therapeutic drug level monitoring: Secondary | ICD-10-CM

## 2021-01-03 LAB — POCT INR: INR: 6.1 — AB (ref 2.0–3.0)

## 2021-01-03 NOTE — Patient Instructions (Signed)
Started on charcoal 234m daily by Dr RLaural Golden   Hold warfarin x 3 days Recheck INR on Thursday Bleeding and fall precautions discussed with pt and he verbalized understanding.  No signs of bleeding.

## 2021-01-04 NOTE — Progress Notes (Signed)
.    Cardiology Office Note  Date: 01/05/2021   ID: GANESH DEEG, DOB [birth year], MRN [id number]  PCP:  Manon Hilding, MD  Cardiologist:  Rozann Lesches, MD Electrophysiologist:  None   Chief Complaint  Patient presents with  . Cardiac follow-up    History of Present Illness: [name] is an 87 y.o. male last seen in September 2021.  He is here today with his wife for a follow-up visit.  Overall stable from a cardiac perspective, no active palpitations or angina.  He did have a recent bout of small bowel Crohn's and C. difficile colitis, treated by Dr. Laural Golden, I reviewed the recent note.  He is on Coumadin with follow-up in anticoagulation clinic.  Recent INR was supratherapeutic at 6.1, subsequent adjustments made with follow-up PT/INR scheduled for tomorrow.  I reviewed the remainder of his cardiac medications.  He has had adequate heart rate control on combination of Coreg and Cardizem CD.  No recent nitroglycerin use.  Past Medical History:  Diagnosis Date  . Anemia   . Atrial fibrillation ([place])   . BPH (benign prostatic hyperplasia)   . CAD (coronary artery disease)    Catheterization 2004, mild/moderate nonobstructive disease  /   nuclear, 2007, small inferior scar // no ischemia  . Crohn's disease ([place])   . Elevated PSA   . GERD (gastroesophageal reflux disease)   . History of kidney stones   . History of pneumonia   . Hypothyroidism   . Mitral regurgitation   . SBO (small bowel obstruction) ([place])   . Urinary retention   . Wears glasses     Past Surgical History:  Procedure Laterality Date  . AGILE CAPSULE  10/18/2011   Procedure: AGILE CAPSULE;  Surgeon: Rogene Houston, MD;  [facility];  Service: Endoscopy;  Laterality: N/A;  730  . CARDIAC CATHETERIZATION  2004  . CHOLECYSTECTOMY  2010   Dr. Anthony Sar  . COLONOSCOPY  2008   [facility]  . COLONOSCOPY N/A 03/23/2016   Procedure: COLONOSCOPY;  Surgeon: Rogene Houston, MD;  [facility];  Service: Endoscopy;  Laterality: N/A;  1:00  . CYSTOSCOPY WITH INSERTION OF UROLIFT    . ESOPHAGOGASTRODUODENOSCOPY  11/08/2011   Procedure: ESOPHAGOGASTRODUODENOSCOPY (EGD);  Surgeon: Rogene Houston, MD;  [facility];  Service: Endoscopy;  Laterality: N/A;  300  . POLYPECTOMY  03/23/2016   Procedure: POLYPECTOMY;  Surgeon: Rogene Houston, MD;  [facility];  Service: Endoscopy;;  Cecal polyp removed via cold forceps recto-sigmoid polyp removed via cold snare  . TRANSURETHRAL RESECTION OF PROSTATE N/A 04/29/2020   Procedure: TRANSURETHRAL RESECTION OF THE PROSTATE (TURP);  Surgeon: Franchot Gallo, MD;  Location: [facility];  Service: Urology;  Laterality: N/A;  90 MINS    Current Outpatient Medications  Medication Sig Dispense Refill  . acetaminophen (TYLENOL) 500 MG tablet Take 500 mg by mouth every 6 (six) hours as needed.    . Bacillus Coagulans-Inulin (ALIGN PREBIOTIC-PROBIOTIC PO) Take by mouth daily.    . carvedilol (COREG) 6.25 MG tablet Take 1 tablet (6.25 mg total) by mouth 2 (two) times daily. 60 tablet 6  . Charcoal 200 MG CAPS Take 200 mg by mouth at bedtime.    Marland Kitchen diltiazem (CARDIZEM CD) 180 MG 24 hr capsule Take 180 mg by mouth 2 (two) times daily.    . famotidine (PEPCID) 40 MG tablet TAKE ONE TABLET BY MOUTH AT BEDTIME 90 tablet  3  . levothyroxine (SYNTHROID, LEVOTHROID) 75 MCG tablet Take 75 mcg by mouth daily before breakfast.    . magnesium gluconate (MAGONATE) 500 MG tablet Take 0.5 tablets (250 mg total) by mouth 2 (two) times daily.    . Melatonin 10 MG TABS Take 10 mg by mouth at bedtime.     . nitroGLYCERIN (NITROSTAT) 0.4 MG SL tablet Place 0.4 mg under the tongue every 5 (five) minutes as needed. For chest pains. May repeat for up to 3 doses.    Marland Kitchen PENTASA 500 MG CR capsule TAKE ONE CAPSULE BY MOUTH FOUR TIMES DAILY 240 capsule 5  . simvastatin (ZOCOR) 10 MG tablet Take 10 mg by mouth at bedtime.    Marland Kitchen warfarin  (COUMADIN) 2.5 MG tablet Take 2.5 mg by mouth as directed.     No current facility-administered medications for this visit.   Allergies:  Patient has no known allergies.   ROS: No syncope.  Physical Exam: VS:  BP 116/74 (BP Location: Right Arm, Patient Position: Sitting, Cuff Size: Normal)   Pulse 72   Wt 149 lb 9.6 oz (67.9 kg)   SpO2 97%   BMI 22.75 kg/m , BMI Body mass index is 22.75 kg/m.  Wt Readings from Last 3 Encounters:  01/05/21 149 lb 9.6 oz (67.9 kg)  12/30/20 149 lb 9.6 oz (67.9 kg)  12/28/20 150 lb (68 kg)    General: Patient appears comfortable at rest. HEENT: Conjunctiva and lids normal, wearing a mask. Neck: Supple, no elevated JVP, soft right carotid bruit, no thyromegaly. Lungs: Clear to auscultation, nonlabored breathing at rest. Cardiac: Irregularly irregular, no S3, soft systolic murmur, no pericardial rub. Extremities: No pitting edema, distal pulses 2+.  ECG:  An ECG dated 11/19/2020 was personally reviewed today and demonstrated:  Atrial fibrillation with PVC.  Recent Labwork: 11/19/2020: ALT 34; AST 24; BUN 15; Creatinine, Ser 1.35; Hemoglobin 14.5; Magnesium 1.5; Platelets 199; Potassium 3.5; Sodium 136   Other Studies Reviewed Today:  Echocardiogram 05/19/2020: 1. Left ventricular ejection fraction, by estimation, is approximately  55%. Left ventricular diastolic parameters are indeterminate in the  setting of atrial fibrillation.  2. Right ventricular systolic function is normal. The right ventricular  size is normal. There is normal pulmonary artery systolic pressure. The  estimated right ventricular systolic pressure is 63.8 mmHg.  3. Left atrial size was moderately dilated.  4. The mitral valve is grossly normal. Mild to moderate mitral valve  regurgitation.  5. The aortic valve is tricuspid. Aortic valve regurgitation is mild.  6. The inferior vena cava is normal in size with greater than 50%  respiratory variability, suggesting right  atrial pressure of 3 mmHg.   Carotid Dopplers 07/07/2020: Summary:  Right Carotid: There is no evidence of stenosis in the right ICA.   Left Carotid: Velocities in the left ICA are consistent with a 1-39%  stenosis.   Vertebrals: Bilateral vertebral arteries demonstrate antegrade flow.  Subclavians: Normal flow hemodynamics were seen in bilateral subclavian        arteries.   Assessment and Plan:  1.  Permanent atrial fibrillation with CHA2DS2-VASc score of 2-3.  Recent INR supratherapeutic with adjustments made in Coumadin and follow-up scheduled for tomorrow.  No obvious bleeding problems. Continue heart rate control with Coreg and Cardizem CD.  2.  Asymptomatic, mild internal carotid atherosclerosis.  Continue Zocor.  3.  Asymptomatic, mild to moderate mitral regurgitation.  4.  Nonobstructive CAD by prior testing, no active angina symptoms and low risk  Myoview in 2018.  Continue observation on statin therapy, nitroglycerin available.  Medication Adjustments/Labs and Tests Ordered: Current medicines are reviewed at length with the patient today.  Concerns regarding medicines are outlined above.   Tests Ordered: No orders of the defined types were placed in this encounter.   Medication Changes: No orders of the defined types were placed in this encounter.   Disposition:  Follow up 6 months in the [place] office.  Signed, Satira Sark, MD, [facility] 01/05/2021 10:09 AM    [facility] [place], [place], [place] [address] Phone: [phone]; Fax: [phone]

## 2021-01-05 ENCOUNTER — Ambulatory Visit: Payer: PPO | Admitting: Cardiology

## 2021-01-05 ENCOUNTER — Encounter: Payer: Self-pay | Admitting: Cardiology

## 2021-01-05 VITALS — BP 116/74 | HR 72 | Wt 149.6 lb

## 2021-01-05 DIAGNOSIS — I2510 Atherosclerotic heart disease of native coronary artery without angina pectoris: Secondary | ICD-10-CM

## 2021-01-05 DIAGNOSIS — I4821 Permanent atrial fibrillation: Secondary | ICD-10-CM | POA: Diagnosis not present

## 2021-01-05 NOTE — Patient Instructions (Addendum)

## 2021-01-06 ENCOUNTER — Ambulatory Visit (INDEPENDENT_AMBULATORY_CARE_PROVIDER_SITE_OTHER): Payer: PPO | Admitting: Pharmacist

## 2021-01-06 DIAGNOSIS — Z5181 Encounter for therapeutic drug level monitoring: Secondary | ICD-10-CM

## 2021-01-06 DIAGNOSIS — I4891 Unspecified atrial fibrillation: Secondary | ICD-10-CM

## 2021-01-06 LAB — POCT INR: INR: 1.4 — AB (ref 2.0–3.0)

## 2021-01-06 NOTE — Patient Instructions (Signed)
Description   Take 2 tablets today (13m) then continue 1 tablet daily except for 1.5 tablets on Thursdays.  Recheck in 1 week.

## 2021-01-12 DIAGNOSIS — Z6822 Body mass index (BMI) 22.0-22.9, adult: Secondary | ICD-10-CM | POA: Diagnosis not present

## 2021-01-12 DIAGNOSIS — M545 Low back pain, unspecified: Secondary | ICD-10-CM | POA: Diagnosis not present

## 2021-01-13 ENCOUNTER — Ambulatory Visit (INDEPENDENT_AMBULATORY_CARE_PROVIDER_SITE_OTHER): Payer: PPO | Admitting: *Deleted

## 2021-01-13 DIAGNOSIS — I4891 Unspecified atrial fibrillation: Secondary | ICD-10-CM

## 2021-01-13 DIAGNOSIS — Z5181 Encounter for therapeutic drug level monitoring: Secondary | ICD-10-CM

## 2021-01-13 LAB — POCT INR: INR: 2.9 (ref 2.0–3.0)

## 2021-01-13 NOTE — Patient Instructions (Signed)
Decrease warfarin to 1 tablet daily.  Recheck in 2 weeks

## 2021-01-20 DIAGNOSIS — Z23 Encounter for immunization: Secondary | ICD-10-CM | POA: Diagnosis not present

## 2021-01-27 ENCOUNTER — Other Ambulatory Visit: Payer: Self-pay

## 2021-01-27 ENCOUNTER — Ambulatory Visit (INDEPENDENT_AMBULATORY_CARE_PROVIDER_SITE_OTHER): Payer: PPO | Admitting: *Deleted

## 2021-01-27 DIAGNOSIS — I4891 Unspecified atrial fibrillation: Secondary | ICD-10-CM

## 2021-01-27 DIAGNOSIS — Z5181 Encounter for therapeutic drug level monitoring: Secondary | ICD-10-CM | POA: Diagnosis not present

## 2021-01-27 LAB — POCT INR: INR: 2.5 (ref 2.0–3.0)

## 2021-01-27 NOTE — Patient Instructions (Signed)
Continue warfarin 1 tablet daily.   Recheck in 3 weeks

## 2021-01-31 DIAGNOSIS — M47816 Spondylosis without myelopathy or radiculopathy, lumbar region: Secondary | ICD-10-CM | POA: Diagnosis not present

## 2021-01-31 DIAGNOSIS — M9903 Segmental and somatic dysfunction of lumbar region: Secondary | ICD-10-CM | POA: Diagnosis not present

## 2021-02-03 DIAGNOSIS — M9903 Segmental and somatic dysfunction of lumbar region: Secondary | ICD-10-CM | POA: Diagnosis not present

## 2021-02-03 DIAGNOSIS — M47816 Spondylosis without myelopathy or radiculopathy, lumbar region: Secondary | ICD-10-CM | POA: Diagnosis not present

## 2021-02-06 IMAGING — XA Imaging study
2 series · 2 of 2 positions shown · non-contrast
Comparison: none

CLINICAL DATA: Lumbosacral spondylosis without myelopathy. Pain in
the right low back, right hip, and occasionally right thigh.

[Series 1: ortho standard · 1 of 1 slices shown (1 of 2)]
[im 1/1]
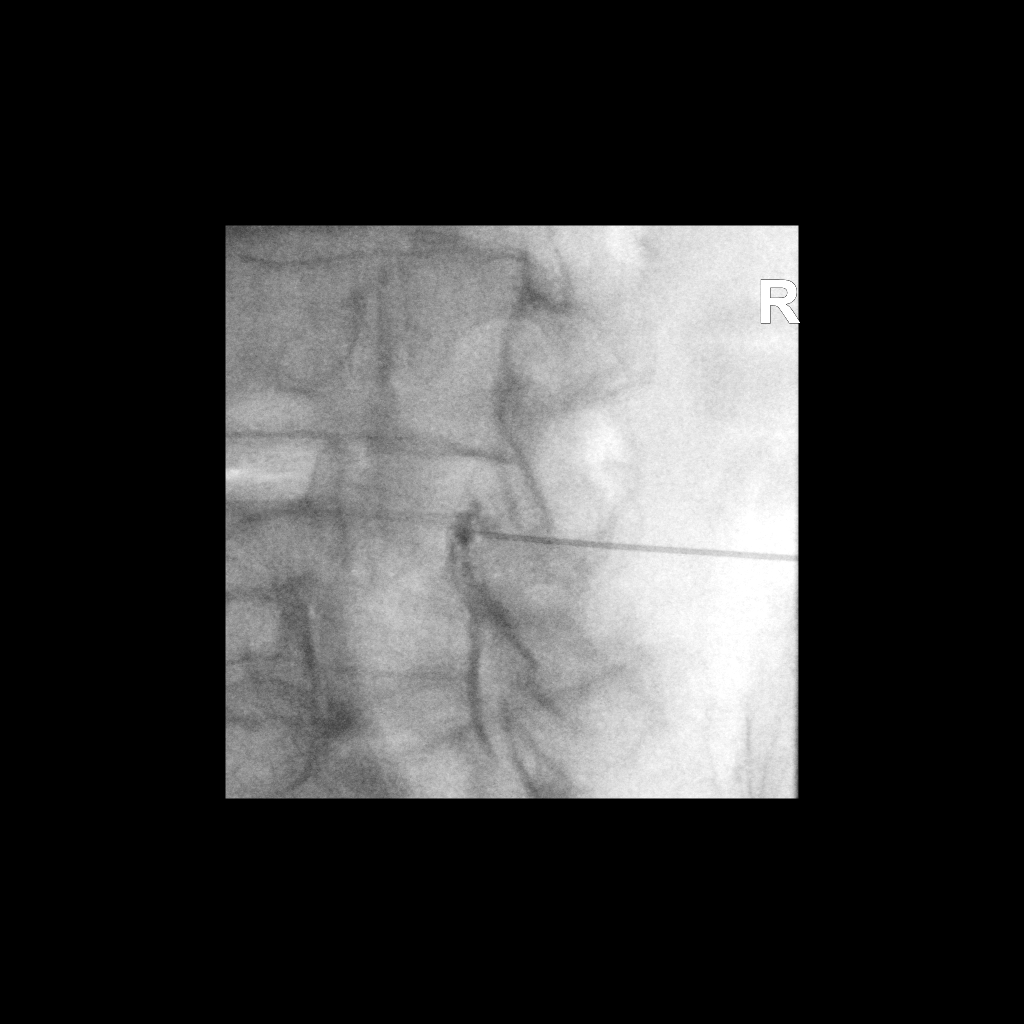

[Series 2: ortho standard · 1 of 1 slices shown (2 of 2)]
[im 1/1]
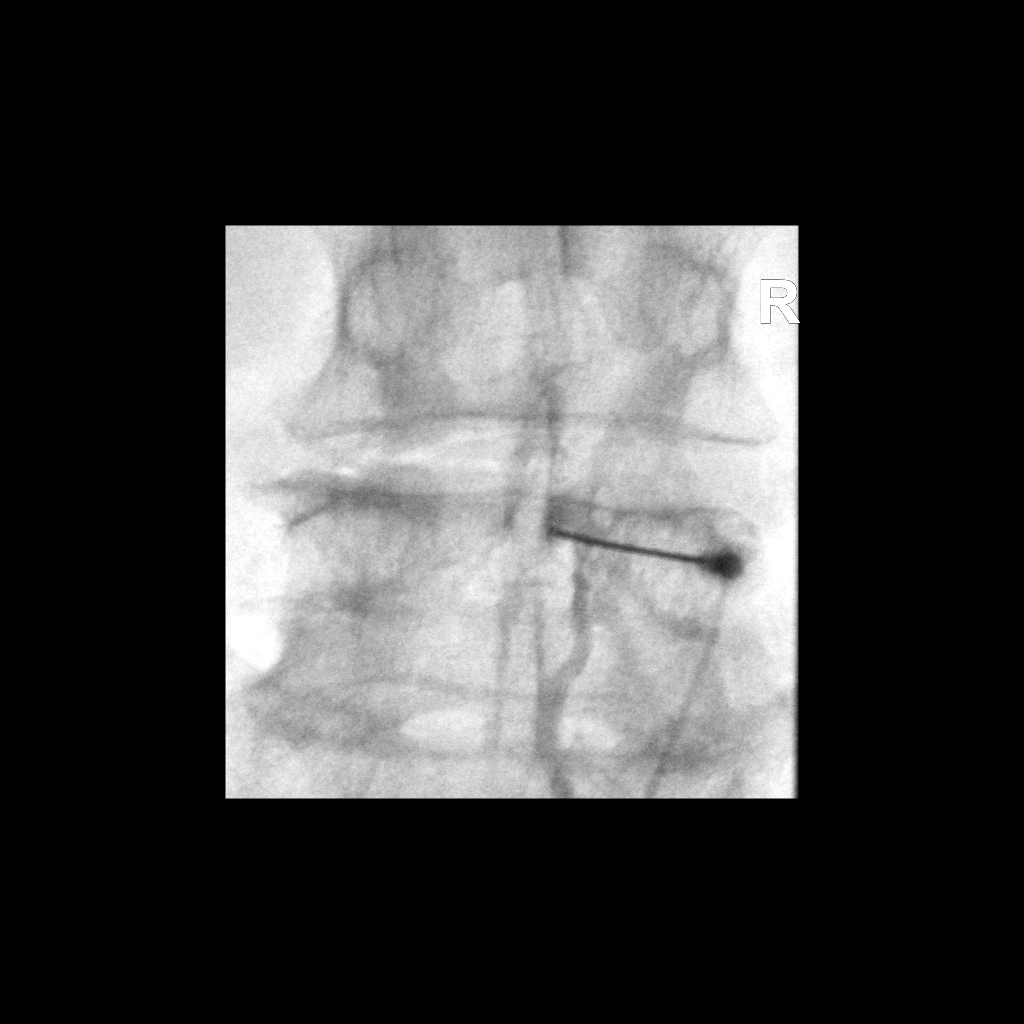

[2 of 2 positions shown; findings below may reference images not displayed]

FLUOROSCOPY TIME:  Fluoroscopy Time: 12 seconds

Radiation Exposure Index: 12.87 microGray*m^2

PROCEDURE:
The procedure, risks, benefits, and alternatives were explained to
the patient. Questions regarding the procedure were encouraged and
answered. The patient understands and consents to the procedure.

LUMBAR EPIDURAL INJECTION:

An interlaminar approach was performed on the right at L4-5. The
overlying skin was cleansed and anesthetized. A 3.5 inch 20 gauge
epidural needle was advanced using loss-of-resistance technique.

DIAGNOSTIC EPIDURAL INJECTION:

Injection of Isovue-M 200 shows a good epidural pattern with spread
above and below the level of needle placement, primarily on the
right. No vascular opacification is seen.

THERAPEUTIC EPIDURAL INJECTION:

120 mg of Depo-Medrol mixed with 3 mL of 1% lidocaine were
instilled. The procedure was well-tolerated, and the patient was
discharged thirty minutes following the injection in good condition.

COMPLICATIONS:
None
IMPRESSION: Technically successful interlaminar epidural injection on the right
at L4-5.

## 2021-02-07 DIAGNOSIS — H25813 Combined forms of age-related cataract, bilateral: Secondary | ICD-10-CM | POA: Diagnosis not present

## 2021-02-08 DIAGNOSIS — M47816 Spondylosis without myelopathy or radiculopathy, lumbar region: Secondary | ICD-10-CM | POA: Diagnosis not present

## 2021-02-08 DIAGNOSIS — M9903 Segmental and somatic dysfunction of lumbar region: Secondary | ICD-10-CM | POA: Diagnosis not present

## 2021-02-11 DIAGNOSIS — M47816 Spondylosis without myelopathy or radiculopathy, lumbar region: Secondary | ICD-10-CM | POA: Diagnosis not present

## 2021-02-11 DIAGNOSIS — M9903 Segmental and somatic dysfunction of lumbar region: Secondary | ICD-10-CM | POA: Diagnosis not present

## 2021-02-12 DIAGNOSIS — I482 Chronic atrial fibrillation, unspecified: Secondary | ICD-10-CM | POA: Diagnosis not present

## 2021-02-12 DIAGNOSIS — I2510 Atherosclerotic heart disease of native coronary artery without angina pectoris: Secondary | ICD-10-CM | POA: Diagnosis not present

## 2021-02-12 DIAGNOSIS — E7849 Other hyperlipidemia: Secondary | ICD-10-CM | POA: Diagnosis not present

## 2021-02-12 DIAGNOSIS — I129 Hypertensive chronic kidney disease with stage 1 through stage 4 chronic kidney disease, or unspecified chronic kidney disease: Secondary | ICD-10-CM | POA: Diagnosis not present

## 2021-02-12 DIAGNOSIS — N183 Chronic kidney disease, stage 3 unspecified: Secondary | ICD-10-CM | POA: Diagnosis not present

## 2021-02-14 DIAGNOSIS — M47816 Spondylosis without myelopathy or radiculopathy, lumbar region: Secondary | ICD-10-CM | POA: Diagnosis not present

## 2021-02-14 DIAGNOSIS — M9903 Segmental and somatic dysfunction of lumbar region: Secondary | ICD-10-CM | POA: Diagnosis not present

## 2021-02-16 DIAGNOSIS — D485 Neoplasm of uncertain behavior of skin: Secondary | ICD-10-CM | POA: Diagnosis not present

## 2021-02-16 DIAGNOSIS — C4442 Squamous cell carcinoma of skin of scalp and neck: Secondary | ICD-10-CM | POA: Diagnosis not present

## 2021-02-16 DIAGNOSIS — L570 Actinic keratosis: Secondary | ICD-10-CM | POA: Diagnosis not present

## 2021-02-17 ENCOUNTER — Ambulatory Visit (INDEPENDENT_AMBULATORY_CARE_PROVIDER_SITE_OTHER): Payer: PPO | Admitting: *Deleted

## 2021-02-17 DIAGNOSIS — Z5181 Encounter for therapeutic drug level monitoring: Secondary | ICD-10-CM

## 2021-02-17 DIAGNOSIS — I4891 Unspecified atrial fibrillation: Secondary | ICD-10-CM | POA: Diagnosis not present

## 2021-02-17 LAB — POCT INR: INR: 2.3 (ref 2.0–3.0)

## 2021-02-17 NOTE — Patient Instructions (Signed)
Continue warfarin 1 tablet daily.   Recheck in 4 weeks

## 2021-02-21 DIAGNOSIS — M9903 Segmental and somatic dysfunction of lumbar region: Secondary | ICD-10-CM | POA: Diagnosis not present

## 2021-02-21 DIAGNOSIS — M47816 Spondylosis without myelopathy or radiculopathy, lumbar region: Secondary | ICD-10-CM | POA: Diagnosis not present

## 2021-02-23 DIAGNOSIS — M519 Unspecified thoracic, thoracolumbar and lumbosacral intervertebral disc disorder: Secondary | ICD-10-CM | POA: Diagnosis not present

## 2021-02-23 DIAGNOSIS — M47816 Spondylosis without myelopathy or radiculopathy, lumbar region: Secondary | ICD-10-CM | POA: Diagnosis not present

## 2021-02-23 DIAGNOSIS — M545 Low back pain, unspecified: Secondary | ICD-10-CM | POA: Diagnosis not present

## 2021-02-23 DIAGNOSIS — M5431 Sciatica, right side: Secondary | ICD-10-CM | POA: Diagnosis not present

## 2021-02-23 DIAGNOSIS — M9903 Segmental and somatic dysfunction of lumbar region: Secondary | ICD-10-CM | POA: Diagnosis not present

## 2021-02-24 DIAGNOSIS — C4442 Squamous cell carcinoma of skin of scalp and neck: Secondary | ICD-10-CM | POA: Diagnosis not present

## 2021-03-02 DIAGNOSIS — M47816 Spondylosis without myelopathy or radiculopathy, lumbar region: Secondary | ICD-10-CM | POA: Diagnosis not present

## 2021-03-02 DIAGNOSIS — M9903 Segmental and somatic dysfunction of lumbar region: Secondary | ICD-10-CM | POA: Diagnosis not present

## 2021-03-03 ENCOUNTER — Ambulatory Visit (INDEPENDENT_AMBULATORY_CARE_PROVIDER_SITE_OTHER): Payer: PPO | Admitting: Internal Medicine

## 2021-03-03 ENCOUNTER — Other Ambulatory Visit: Payer: Self-pay

## 2021-03-03 VITALS — BP 138/75 | HR 72 | Temp 98.6°F | Ht 68.0 in | Wt 152.0 lb

## 2021-03-03 DIAGNOSIS — R198 Other specified symptoms and signs involving the digestive system and abdomen: Secondary | ICD-10-CM | POA: Diagnosis not present

## 2021-03-03 DIAGNOSIS — K50018 Crohn's disease of small intestine with other complication: Secondary | ICD-10-CM

## 2021-03-03 MED ORDER — ADALIMUMAB 40 MG/0.8ML ~~LOC~~ AJKT
80.0000 mg | AUTO-INJECTOR | Freq: Once | SUBCUTANEOUS | Status: AC
  Administered 2021-03-03: 80 mg via SUBCUTANEOUS

## 2021-03-03 NOTE — Patient Instructions (Signed)
Will continue Pentasa for now. Will stop Pentasa after 8 weeks once we have established that Humira is working. Please call office if you feel you are having any side effects.

## 2021-03-03 NOTE — Progress Notes (Signed)
Presenting complaint;  Follow-up for Crohn's disease.  Database and subjective:  Patient is 87 year old Caucasian male who has small bowel Crohn's disease status post bowel resection in October 2020 who has been documented to have recurrent disease based on imaging studies as well as small bowel follow-through.  He has been maintained on Pentasa ever since.  He is also been treated with budesonide.  He was last seen on 12/30/2020.  Prior to that visit he was seen in emergency room for abdominal pain nausea and vomiting. His wife states that he had another episode of nausea and vomiting on the evening of 02/19/2021.  He vomited at least 4 times.  She recalls that he had meat and spaghetti.  They have essentially eliminated red meat.  He has noted decrease in flatulence.  He is not using activated charcoal but he feels he needs to go back on it.  He generally has 2 formed stools daily.  He denies melena or rectal bleeding.  He still has difficult defecation.  He is using Dulcolax suppository 3 times a week and it seems to help.  Presently he is having problem with his back pain.  He cannot walk for long time.  He is not having any numbness or weakness in his lower extremities. His appetite is fair.  His weight is up by 3 pounds.   Current Medications: Outpatient Encounter Medications as of 03/03/2021  Medication Sig  . acetaminophen (TYLENOL) 500 MG tablet Take 500 mg by mouth every 6 (six) hours as needed.  . Bacillus Coagulans-Inulin (ALIGN PREBIOTIC-PROBIOTIC PO) Take by mouth daily.  . carvedilol (COREG) 6.25 MG tablet Take 1 tablet (6.25 mg total) by mouth 2 (two) times daily.  . Charcoal 200 MG CAPS Take 200 mg by mouth at bedtime.  Marland Kitchen diltiazem (CARDIZEM CD) 180 MG 24 hr capsule Take 180 mg by mouth 2 (two) times daily.  . famotidine (PEPCID) 40 MG tablet TAKE ONE TABLET BY MOUTH AT BEDTIME  . levothyroxine (SYNTHROID, LEVOTHROID) 75 MCG tablet Take 75 mcg by mouth daily before breakfast.  .  magnesium gluconate (MAGONATE) 500 MG tablet Take 0.5 tablets (250 mg total) by mouth 2 (two) times daily.  . Melatonin 10 MG TABS Take 10 mg by mouth at bedtime.   . nitroGLYCERIN (NITROSTAT) 0.4 MG SL tablet Place 0.4 mg under the tongue every 5 (five) minutes as needed. For chest pains. May repeat for up to 3 doses.  Marland Kitchen PENTASA 500 MG CR capsule TAKE ONE CAPSULE BY MOUTH FOUR TIMES DAILY  . simvastatin (ZOCOR) 10 MG tablet Take 10 mg by mouth at bedtime.  Marland Kitchen warfarin (COUMADIN) 2.5 MG tablet Take 2.5 mg by mouth as directed.   No facility-administered encounter medications on file as of 03/03/2021.     Objective: Blood pressure 138/75, pulse 72, temperature 98.6 F (37 C), temperature source Oral, height 5' 8"  (1.727 m), weight 152 lb (68.9 kg). Patient is alert and in no acute distress. Conjunctiva is pink. Sclera is nonicteric Oropharyngeal mucosa is normal. No neck masses or thyromegaly noted. Cardiac exam with regular rhythm normal S1 and S2. No murmur or gallop noted. Lungs are clear to auscultation. Abdomen is full but not distended.  Midline scar.  Bowel sounds are normal.  Percussion note is somewhat tympanitic.  No tenderness organomegaly or masses. No LE edema or clubbing noted.  Labs/studies Results:  CBC Latest Ref Rng & Units 11/19/2020 04/29/2020 09/25/2019  WBC 4.0 - 10.5 K/uL 9.4 - 12.3(H)  Hemoglobin  13.0 - 17.0 g/dL 14.5 13.3 12.2(L)  Hematocrit 39.0 - 52.0 % 43.7 39.0 37.1(L)  Platelets 150 - 400 K/uL 199 - 258    CMP Latest Ref Rng & Units 11/19/2020 04/29/2020 09/25/2019  Glucose 70 - 99 mg/dL 132(H) 95 97  BUN 8 - 23 mg/dL 15 22 15   Creatinine 0.61 - 1.24 mg/dL 1.35(H) 1.70(H) 0.95  Sodium 135 - 145 mmol/L 136 141 137  Potassium 3.5 - 5.1 mmol/L 3.5 4.2 4.3  Chloride 98 - 111 mmol/L 100 105 102  CO2 22 - 32 mmol/L 26 - 27  Calcium 8.9 - 10.3 mg/dL 9.5 - 8.4(L)  Total Protein 6.5 - 8.1 g/dL 6.5 - 5.7(L)  Total Bilirubin 0.3 - 1.2 mg/dL 1.1 - 0.4  Alkaline Phos  38 - 126 U/L 67 - -  AST 15 - 41 U/L 24 - 16  ALT 0 - 44 U/L 34 - 21    Hepatic Function Latest Ref Rng & Units 11/19/2020 09/25/2019 06/15/2017  Total Protein 6.5 - 8.1 g/dL 6.5 5.7(L) 6.2  Albumin 3.5 - 5.0 g/dL 3.6 - 3.9  AST 15 - 41 U/L 24 16 14   ALT 0 - 44 U/L 34 21 10  Alk Phosphatase 38 - 126 U/L 67 - 65  Total Bilirubin 0.3 - 1.2 mg/dL 1.1 0.4 0.6  Bilirubin, Direct <=0.2 mg/dL - - 0.1    Abdominal pelvic CT from 11/19/2020 and small bowel study from December 2020 images reviewed with patient.  Assessment:  #1.  Crohn's disease.  He has well-documented distal small bowel stricture proximal to anastomosis.  He has been treated with Pentasa and budesonide intermittently.  He remains with intermittent symptoms of nausea and vomiting felt to be due to the stricture.  It therefore would be appropriate to step up his therapy with biologic.  Goal is to prevent him from having another bowel resection.  We have discussed pros and cons of biologic therapy with the patient and his wife who is with him and also his daughter over the phone few weeks ago.  Patient is ready to proceed. If he does well with Humira/adalimumab we will plan to discontinue Pentasa at the time of next office visit.  #2.  Defecation disorder.  He has difficulty in evacuation which appears to be a separate issue.  He seem to be getting good results with Dulcolax suppository which she will continue use on as-needed basis.  Anorectal manometry would provide information that may be helpful in his management but he will have to travel out of town for the study; therefore will wait.  Plan:  Pay patient was given first dose of Humira/adalimumab 80 mg subcu by Ms. Benjaman Lobe, CMA and was observed for few minutes.  He did not have any side effects. Patient's wife is very comfortable giving him second dose tomorrow and then he will receive 80 mg on day 15 and therefore 40 mg subcu every 14 days. If he has any problems he will call  office. He will continue low residue diet. He will return for office visit in 8 weeks. We will plan blood work after his next visit.

## 2021-03-04 ENCOUNTER — Encounter (INDEPENDENT_AMBULATORY_CARE_PROVIDER_SITE_OTHER): Payer: Self-pay | Admitting: Internal Medicine

## 2021-03-08 ENCOUNTER — Ambulatory Visit (INDEPENDENT_AMBULATORY_CARE_PROVIDER_SITE_OTHER): Payer: PPO | Admitting: Internal Medicine

## 2021-03-08 DIAGNOSIS — M9903 Segmental and somatic dysfunction of lumbar region: Secondary | ICD-10-CM | POA: Diagnosis not present

## 2021-03-08 DIAGNOSIS — M47816 Spondylosis without myelopathy or radiculopathy, lumbar region: Secondary | ICD-10-CM | POA: Diagnosis not present

## 2021-03-10 DIAGNOSIS — M47816 Spondylosis without myelopathy or radiculopathy, lumbar region: Secondary | ICD-10-CM | POA: Diagnosis not present

## 2021-03-10 DIAGNOSIS — M9903 Segmental and somatic dysfunction of lumbar region: Secondary | ICD-10-CM | POA: Diagnosis not present

## 2021-03-14 DIAGNOSIS — E7849 Other hyperlipidemia: Secondary | ICD-10-CM | POA: Diagnosis not present

## 2021-03-14 DIAGNOSIS — N183 Chronic kidney disease, stage 3 unspecified: Secondary | ICD-10-CM | POA: Diagnosis not present

## 2021-03-14 DIAGNOSIS — I129 Hypertensive chronic kidney disease with stage 1 through stage 4 chronic kidney disease, or unspecified chronic kidney disease: Secondary | ICD-10-CM | POA: Diagnosis not present

## 2021-03-14 DIAGNOSIS — I482 Chronic atrial fibrillation, unspecified: Secondary | ICD-10-CM | POA: Diagnosis not present

## 2021-03-14 DIAGNOSIS — I2510 Atherosclerotic heart disease of native coronary artery without angina pectoris: Secondary | ICD-10-CM | POA: Diagnosis not present

## 2021-03-15 ENCOUNTER — Other Ambulatory Visit: Payer: Self-pay

## 2021-03-15 ENCOUNTER — Ambulatory Visit: Payer: PPO | Admitting: Urology

## 2021-03-15 ENCOUNTER — Telehealth: Payer: Self-pay

## 2021-03-15 ENCOUNTER — Ambulatory Visit (INDEPENDENT_AMBULATORY_CARE_PROVIDER_SITE_OTHER): Payer: PPO | Admitting: Internal Medicine

## 2021-03-15 VITALS — BP 126/74 | HR 81

## 2021-03-15 DIAGNOSIS — R338 Other retention of urine: Secondary | ICD-10-CM | POA: Diagnosis not present

## 2021-03-15 DIAGNOSIS — R1031 Right lower quadrant pain: Secondary | ICD-10-CM

## 2021-03-15 DIAGNOSIS — N401 Enlarged prostate with lower urinary tract symptoms: Secondary | ICD-10-CM

## 2021-03-15 LAB — BLADDER SCAN AMB NON-IMAGING: Scan Result: 1

## 2021-03-15 NOTE — Progress Notes (Signed)
History of Present Illness: This man comes in today for a 16-monthhistory of right lower quadrant discomfort.  Better when he lies flat, worse when he stands up, better after a bowel movement.  Some urinary frequency.  No gross hematuria, no dysuria, no flank or back pain that is new.  He does have chronic back pain, currently sees a cRestaurant manager, fast food  He was diagnosed with Crohn's disease recently.  He is treated by Dr. RLaural Goldenfor this. Past Medical History:  Diagnosis Date  . Anemia   . [place]rial [place] ([place]   . BPH (benign prostatic hyperplasia)   . CAD (coronary artery disease)    Catheterization 2004, mild/moderate nonobstructive disease  /   nuclear, 2007, small inferior scar // no ischemia  . [place]'s disease ([place]   . Elevated PSA   . GERD (gastroesophageal reflux disease)   . History of kidney stones   . History of pneumonia   . Hypothyroidism   . Mitral regurgitation   . [place]) [place]   . Urinary retention   . Wears glasses     Past Surgical History:  Procedure Laterality Date  . AGILE CAPSULE  10/18/2011   Procedure: AGILE CAPSULE;  Surgeon: NRogene Houston MD;  [facility];  Service: Endoscopy;  Laterality: N/A;  730  . CARDIAC CATHETERIZATION  2004  . CHOLECYSTECTOMY  2010   Dr. DAnthony Sar . COLONOSCOPY  2008   [facility]  . COLONOSCOPY N/A 03/23/2016   Procedure: COLONOSCOPY;  Surgeon: NRogene Houston MD;  [facility];  Service: Endoscopy;  Laterality: N/A;  1:00  . CYSTOSCOPY WITH INSERTION OF UROLIFT    . ESOPHAGOGASTRODUODENOSCOPY  11/08/2011   Procedure: ESOPHAGOGASTRODUODENOSCOPY (EGD);  Surgeon: NRogene Houston MD;  [facility];  Service: Endoscopy;  Laterality: N/A;  300  . POLYPECTOMY  03/23/2016   Procedure: POLYPECTOMY;  Surgeon: NRogene Houston MD;  [facility];  Service: Endoscopy;;  Cecal polyp removed via cold forceps recto-sigmoid polyp removed via cold snare  . TRANSURETHRAL RESECTION  OF PROSTATE N/A 04/29/2020   Procedure: TRANSURETHRAL RESECTION OF THE PROSTATE (TURP);  Surgeon: DFranchot Gallo MD;  [facility]  Service: Urology;  Laterality: N/A;  90 MINS    Home Medications:  Allergies as of 03/15/2021   No Known Allergies     Medication List       Accurate as of Mar 15, 2021  3:27 PM. If you have any questions, ask your nurse or doctor.        STOP taking these medications   ALIGN PREBIOTIC-PROBIOTIC PO Stopped by: SJorja Loa MD     TAKE these medications   acetaminophen 500 MG tablet Commonly known as: TYLENOL Take 500 mg by mouth every 6 (six) hours as needed.   carvedilol 6.25 MG tablet Commonly known as: COREG Take 1 tablet (6.25 mg total) by mouth 2 (two) times daily.   Charcoal 200 MG Caps Take 200 mg by mouth at bedtime.   diltiazem 180 MG 24 hr capsule Commonly known as: CARDIZEM CD Take 180 mg by mouth 2 (two) times daily.   famotidine 40 MG tablet Commonly known as: PEPCID TAKE ONE TABLET BY MOUTH AT BEDTIME   levothyroxine 75 MCG tablet Commonly known as: SYNTHROID Take 75 mcg by mouth daily before breakfast.   magnesium gluconate 500 MG tablet Commonly known as: MAGONATE Take 0.5 tablets (250 mg total) by mouth 2 (two) times  daily.   Melatonin 10 MG Tabs Take 10 mg by mouth at bedtime.   nitroGLYCERIN 0.4 MG SL tablet Commonly known as: NITROSTAT Place 0.4 mg under the tongue every 5 (five) minutes as needed. For chest pains. May repeat for up to 3 doses.   Pentasa 500 MG CR capsule Generic drug: mesalamine TAKE ONE CAPSULE BY MOUTH FOUR TIMES DAILY   simvastatin 10 MG tablet Commonly known as: ZOCOR Take 10 mg by mouth at bedtime.   warfarin 2.5 MG tablet Commonly known as: COUMADIN Take as directed by the anticoagulation clinic. If you are unsure how to take this medication, talk to your nurse or doctor. Original instructions: Take 2.5 mg by mouth as directed.        Allergies: No Known Allergies  Family History  Problem Relation Age of Onset  . Colon cancer Mother   . Heart disease Father   . Stroke Brother   . Healthy Daughter     Social History:  reports that he has never smoked. He has never used smokeless tobacco. He reports that he does not drink alcohol and does not use drugs.  ROS: A complete review of systems was performed.  All systems are negative except for pertinent findings as noted.  Physical Exam:  Vital signs in last 24 hours: BP 126/74   Pulse 81  Constitutional:  Alert and oriented, No acute distress Cardiovascular: Regular rate  Respiratory: Normal respiratory effort GI: Abdomen is soft, minimal right lower quadrant tenderness, nondistended, no abdominal masses. No CVAT.  There are no inguinal hernias. Genitourinary: Normal male phallus, testes are descended bilaterally and non-tender and without masses, scrotum is normal in appearance without lesions or masses, perineum is normal on inspection. Lymphatic: No lymphadenopathy Neurologic: Grossly intact, no focal deficits Psychiatric: Normal mood and affect   Impression/Assessment:  Lower abdominal pain.  I do not think this is of GU etiology.  He does have fairly new diagnosis of Crohn's disease.  Plan:  1.  The patient could not urinate for specimen today.  He will drop off a specimen tomorrow  2.  Unless there is significant abnormality, I have reassured him that I do not think that this is urologic in nature and he can return as needed

## 2021-03-15 NOTE — Telephone Encounter (Signed)
Appt scheduled today with patient.

## 2021-03-15 NOTE — Telephone Encounter (Signed)
Patient is needing an appointment asap with Dr. Diona Fanti due to Right side pain, cannot urinate when feeling the urge to go (delayed), and not emptying bladder when done with urination.  Call back:  [phone]  Thanks, Helene Kelp

## 2021-03-15 NOTE — Progress Notes (Signed)
Urological Symptom Review  PVR 1  Patient is experiencing the following symptoms: Get up at night to urinate Trouble starting stream   Review of Systems  Gastrointestinal (upper)  : Indigestion/heartburn  Gastrointestinal (lower) : Negative for lower GI symptoms  Constitutional : Negative for symptoms  Skin: Negative for skin symptoms  Eyes: Negative for eye symptoms  Ear/Nose/Throat : Negative for Ear/Nose/Throat symptoms  Hematologic/Lymphatic: Easy bruising  Cardiovascular : Chest pain  Respiratory : Negative for respiratory symptoms  Endocrine: Negative for endocrine symptoms  Musculoskeletal: Back pain  Neurological: Negative for neurological symptoms  Psychologic: Negative for psychiatric symptoms

## 2021-03-16 ENCOUNTER — Other Ambulatory Visit: Payer: PPO

## 2021-03-16 DIAGNOSIS — R338 Other retention of urine: Secondary | ICD-10-CM | POA: Diagnosis not present

## 2021-03-16 DIAGNOSIS — M9903 Segmental and somatic dysfunction of lumbar region: Secondary | ICD-10-CM | POA: Diagnosis not present

## 2021-03-16 DIAGNOSIS — N401 Enlarged prostate with lower urinary tract symptoms: Secondary | ICD-10-CM | POA: Diagnosis not present

## 2021-03-16 DIAGNOSIS — M47816 Spondylosis without myelopathy or radiculopathy, lumbar region: Secondary | ICD-10-CM | POA: Diagnosis not present

## 2021-03-16 LAB — URINALYSIS, ROUTINE W REFLEX MICROSCOPIC
Bilirubin, UA: NEGATIVE
Glucose, UA: NEGATIVE
Nitrite, UA: NEGATIVE
Protein,UA: NEGATIVE
RBC, UA: NEGATIVE
Specific Gravity, UA: 1.025 (ref 1.005–1.030)
Urobilinogen, Ur: 0.2 mg/dL (ref 0.2–1.0)
pH, UA: 6 (ref 5.0–7.5)

## 2021-03-16 LAB — MICROSCOPIC EXAMINATION
Bacteria, UA: NONE SEEN
Epithelial Cells (non renal): NONE SEEN /hpf (ref 0–10)
RBC, Urine: NONE SEEN /hpf (ref 0–2)
Renal Epithel, UA: NONE SEEN /hpf

## 2021-03-17 ENCOUNTER — Telehealth (INDEPENDENT_AMBULATORY_CARE_PROVIDER_SITE_OTHER): Payer: Self-pay

## 2021-03-17 ENCOUNTER — Ambulatory Visit (INDEPENDENT_AMBULATORY_CARE_PROVIDER_SITE_OTHER): Payer: PPO | Admitting: *Deleted

## 2021-03-17 DIAGNOSIS — Z5181 Encounter for therapeutic drug level monitoring: Secondary | ICD-10-CM | POA: Diagnosis not present

## 2021-03-17 DIAGNOSIS — I4891 Unspecified atrial fibrillation: Secondary | ICD-10-CM | POA: Diagnosis not present

## 2021-03-17 LAB — POCT INR: INR: 1.8 — AB (ref 2.0–3.0)

## 2021-03-17 NOTE — Telephone Encounter (Signed)
Spoke with the patient today who reported having pressure sensation in his abdomen which has been present for the last few months.  He has presence of stricture in his small bowel without presence of inflammatory changes.  He was recently started on Humira, has received 2 injections so far.  Explained to the patient that it is very possible his symptoms are related to his stricture which may not respond to medication as it seems it is mainly fibrosis and not too many inflammatory changes.  He will try to take some Gas-X for now I will follow-up with Dr. Laural Golden his symptom improvement on on Humira and the consideration for possible surgical resection if very symptomatic.

## 2021-03-17 NOTE — Patient Instructions (Signed)
Take warfarin 1 1/2 tablets today then increase dose to 1 tablet daily except 1 1/2 tablets on Sundays.   Recheck in 3 weeks

## 2021-03-17 NOTE — Telephone Encounter (Signed)
Patient called today stating he is still having the right sided pressure he was having at his last two visits. He describes the pain as being dull in nature. No nausea or vomiting. Please advise as I told the patient Dr. Laural Golden would not be back for a couple more weeks.

## 2021-03-18 ENCOUNTER — Other Ambulatory Visit: Payer: Self-pay | Admitting: Cardiology

## 2021-03-18 DIAGNOSIS — M47816 Spondylosis without myelopathy or radiculopathy, lumbar region: Secondary | ICD-10-CM | POA: Diagnosis not present

## 2021-03-18 DIAGNOSIS — M9903 Segmental and somatic dysfunction of lumbar region: Secondary | ICD-10-CM | POA: Diagnosis not present

## 2021-03-20 ENCOUNTER — Telehealth (INDEPENDENT_AMBULATORY_CARE_PROVIDER_SITE_OTHER): Payer: Self-pay | Admitting: Internal Medicine

## 2021-03-20 NOTE — Telephone Encounter (Signed)
Call patient to check on him. He is not having any abdominal cramping nausea or vomiting. He is doing better with as needed simethicone and activated charcoal. I have asked him to take charcoal only at bedtime in order to prevent interaction with other medications. He also has impaired defecation contributing to his symptoms. I encouraged him to use suppository daily if needed. Patient will call if this change in his symptoms. Plan is to do an MR enterography in 4 months or so and compare with prior study.

## 2021-03-21 DIAGNOSIS — M47816 Spondylosis without myelopathy or radiculopathy, lumbar region: Secondary | ICD-10-CM | POA: Diagnosis not present

## 2021-03-21 DIAGNOSIS — M9903 Segmental and somatic dysfunction of lumbar region: Secondary | ICD-10-CM | POA: Diagnosis not present

## 2021-03-23 DIAGNOSIS — M47816 Spondylosis without myelopathy or radiculopathy, lumbar region: Secondary | ICD-10-CM | POA: Diagnosis not present

## 2021-03-23 DIAGNOSIS — M9903 Segmental and somatic dysfunction of lumbar region: Secondary | ICD-10-CM | POA: Diagnosis not present

## 2021-03-28 DIAGNOSIS — M47816 Spondylosis without myelopathy or radiculopathy, lumbar region: Secondary | ICD-10-CM | POA: Diagnosis not present

## 2021-03-28 DIAGNOSIS — M9903 Segmental and somatic dysfunction of lumbar region: Secondary | ICD-10-CM | POA: Diagnosis not present

## 2021-03-30 DIAGNOSIS — M47816 Spondylosis without myelopathy or radiculopathy, lumbar region: Secondary | ICD-10-CM | POA: Diagnosis not present

## 2021-03-30 DIAGNOSIS — M9903 Segmental and somatic dysfunction of lumbar region: Secondary | ICD-10-CM | POA: Diagnosis not present

## 2021-04-04 DIAGNOSIS — E7800 Pure hypercholesterolemia, unspecified: Secondary | ICD-10-CM | POA: Diagnosis not present

## 2021-04-04 DIAGNOSIS — E7801 Familial hypercholesterolemia: Secondary | ICD-10-CM | POA: Diagnosis not present

## 2021-04-04 DIAGNOSIS — N183 Chronic kidney disease, stage 3 unspecified: Secondary | ICD-10-CM | POA: Diagnosis not present

## 2021-04-04 DIAGNOSIS — E039 Hypothyroidism, unspecified: Secondary | ICD-10-CM | POA: Diagnosis not present

## 2021-04-04 DIAGNOSIS — M9903 Segmental and somatic dysfunction of lumbar region: Secondary | ICD-10-CM | POA: Diagnosis not present

## 2021-04-04 DIAGNOSIS — M47816 Spondylosis without myelopathy or radiculopathy, lumbar region: Secondary | ICD-10-CM | POA: Diagnosis not present

## 2021-04-04 DIAGNOSIS — K2100 Gastro-esophageal reflux disease with esophagitis, without bleeding: Secondary | ICD-10-CM | POA: Diagnosis not present

## 2021-04-04 DIAGNOSIS — D649 Anemia, unspecified: Secondary | ICD-10-CM | POA: Diagnosis not present

## 2021-04-06 DIAGNOSIS — M47816 Spondylosis without myelopathy or radiculopathy, lumbar region: Secondary | ICD-10-CM | POA: Diagnosis not present

## 2021-04-06 DIAGNOSIS — M9903 Segmental and somatic dysfunction of lumbar region: Secondary | ICD-10-CM | POA: Diagnosis not present

## 2021-04-07 ENCOUNTER — Ambulatory Visit (INDEPENDENT_AMBULATORY_CARE_PROVIDER_SITE_OTHER): Payer: PPO | Admitting: *Deleted

## 2021-04-07 ENCOUNTER — Other Ambulatory Visit: Payer: Self-pay

## 2021-04-07 DIAGNOSIS — Z5181 Encounter for therapeutic drug level monitoring: Secondary | ICD-10-CM

## 2021-04-07 DIAGNOSIS — I482 Chronic atrial fibrillation, unspecified: Secondary | ICD-10-CM | POA: Diagnosis not present

## 2021-04-07 DIAGNOSIS — I4891 Unspecified atrial fibrillation: Secondary | ICD-10-CM

## 2021-04-07 DIAGNOSIS — Z0001 Encounter for general adult medical examination with abnormal findings: Secondary | ICD-10-CM | POA: Diagnosis not present

## 2021-04-07 DIAGNOSIS — E7801 Familial hypercholesterolemia: Secondary | ICD-10-CM | POA: Diagnosis not present

## 2021-04-07 DIAGNOSIS — N1330 Unspecified hydronephrosis: Secondary | ICD-10-CM | POA: Diagnosis not present

## 2021-04-07 DIAGNOSIS — N189 Chronic kidney disease, unspecified: Secondary | ICD-10-CM | POA: Diagnosis not present

## 2021-04-07 DIAGNOSIS — K56600 Partial intestinal obstruction, unspecified as to cause: Secondary | ICD-10-CM | POA: Diagnosis not present

## 2021-04-07 DIAGNOSIS — I10 Essential (primary) hypertension: Secondary | ICD-10-CM | POA: Diagnosis not present

## 2021-04-07 LAB — POCT INR: INR: 2.4 (ref 2.0–3.0)

## 2021-04-07 NOTE — Patient Instructions (Signed)
Continue warfarin 1 tablet daily except 1 1/2 tablets on Sundays.   Recheck in 4 weeks

## 2021-04-11 DIAGNOSIS — M9903 Segmental and somatic dysfunction of lumbar region: Secondary | ICD-10-CM | POA: Diagnosis not present

## 2021-04-11 DIAGNOSIS — M47816 Spondylosis without myelopathy or radiculopathy, lumbar region: Secondary | ICD-10-CM | POA: Diagnosis not present

## 2021-04-13 DIAGNOSIS — M9903 Segmental and somatic dysfunction of lumbar region: Secondary | ICD-10-CM | POA: Diagnosis not present

## 2021-04-13 DIAGNOSIS — M47816 Spondylosis without myelopathy or radiculopathy, lumbar region: Secondary | ICD-10-CM | POA: Diagnosis not present

## 2021-04-14 DIAGNOSIS — I2510 Atherosclerotic heart disease of native coronary artery without angina pectoris: Secondary | ICD-10-CM | POA: Diagnosis not present

## 2021-04-14 DIAGNOSIS — E7849 Other hyperlipidemia: Secondary | ICD-10-CM | POA: Diagnosis not present

## 2021-04-14 DIAGNOSIS — I482 Chronic atrial fibrillation, unspecified: Secondary | ICD-10-CM | POA: Diagnosis not present

## 2021-04-14 DIAGNOSIS — N183 Chronic kidney disease, stage 3 unspecified: Secondary | ICD-10-CM | POA: Diagnosis not present

## 2021-04-14 DIAGNOSIS — I129 Hypertensive chronic kidney disease with stage 1 through stage 4 chronic kidney disease, or unspecified chronic kidney disease: Secondary | ICD-10-CM | POA: Diagnosis not present

## 2021-04-19 DIAGNOSIS — M9903 Segmental and somatic dysfunction of lumbar region: Secondary | ICD-10-CM | POA: Diagnosis not present

## 2021-04-19 DIAGNOSIS — M47816 Spondylosis without myelopathy or radiculopathy, lumbar region: Secondary | ICD-10-CM | POA: Diagnosis not present

## 2021-04-21 DIAGNOSIS — M9903 Segmental and somatic dysfunction of lumbar region: Secondary | ICD-10-CM | POA: Diagnosis not present

## 2021-04-21 DIAGNOSIS — M47816 Spondylosis without myelopathy or radiculopathy, lumbar region: Secondary | ICD-10-CM | POA: Diagnosis not present

## 2021-04-26 DIAGNOSIS — M9903 Segmental and somatic dysfunction of lumbar region: Secondary | ICD-10-CM | POA: Diagnosis not present

## 2021-04-26 DIAGNOSIS — M47816 Spondylosis without myelopathy or radiculopathy, lumbar region: Secondary | ICD-10-CM | POA: Diagnosis not present

## 2021-05-03 ENCOUNTER — Other Ambulatory Visit: Payer: Self-pay

## 2021-05-03 ENCOUNTER — Ambulatory Visit (INDEPENDENT_AMBULATORY_CARE_PROVIDER_SITE_OTHER): Payer: PPO | Admitting: Internal Medicine

## 2021-05-03 ENCOUNTER — Encounter (INDEPENDENT_AMBULATORY_CARE_PROVIDER_SITE_OTHER): Payer: Self-pay | Admitting: Internal Medicine

## 2021-05-03 VITALS — BP 133/73 | HR 93 | Temp 98.3°F | Ht 68.0 in | Wt 155.0 lb

## 2021-05-03 DIAGNOSIS — K219 Gastro-esophageal reflux disease without esophagitis: Secondary | ICD-10-CM

## 2021-05-03 DIAGNOSIS — K5000 Crohn's disease of small intestine without complications: Secondary | ICD-10-CM

## 2021-05-03 DIAGNOSIS — M9903 Segmental and somatic dysfunction of lumbar region: Secondary | ICD-10-CM | POA: Diagnosis not present

## 2021-05-03 DIAGNOSIS — M47816 Spondylosis without myelopathy or radiculopathy, lumbar region: Secondary | ICD-10-CM | POA: Diagnosis not present

## 2021-05-03 NOTE — Progress Notes (Signed)
Presenting complaint;  Follow-up for Crohn's disease.  Database and subjective:  Patient is 87 year old Caucasian male who is here for scheduled visit accompanied by his wife [name].  He was diagnosed with Crohn's disease in October 2020 when he had a laparotomy for small bowel obstruction.  Postop course was complicated by C. difficile colitis.  Since then he has been treated with 2 courses of budesonide and has been maintained on Pentasa.  He was seen in emergency room in February this year for abdominal pain nausea and vomiting.  He has distal small bowel stricture. Patient was begun on Humira/adalimumab 2 months ago.  Patient brings along a copy of blood work that he had by Dr. Quintin Alto. He is not having any side effects with Humira.  He cannot tell a big difference as far as symptoms are concerned.  He said he does not feel any worse.  He has 1-2 formed stools daily.  50% of his bowel movements are spontaneous and for rest to use a suppository.  He passes large amount of flatus with his bowel movement daily.  He denies abdominal pain nausea vomiting or rectal bleeding.  His stool is dark because he is on activated charcoal at bedtime.Marland Kitchen  His appetite is very good.  He has gained 3 pounds since his last visit.  He has 9 days of Pentasa left.   Current Medications: Outpatient Encounter Medications as of 05/03/2021  Medication Sig   acetaminophen (TYLENOL) 500 MG tablet Take 500 mg by mouth every 6 (six) hours as needed.   Adalimumab (HUMIRA) 40 MG/0.4ML PSKT Inject into the skin every 14 (fourteen) days.   carvedilol (COREG) 6.25 MG tablet TAKE ONE TABLET BY MOUTH TWICE DAILY   Charcoal 200 MG CAPS Take 200 mg by mouth at bedtime.   diltiazem (CARDIZEM CD) 180 MG 24 hr capsule Take 180 mg by mouth 2 (two) times daily.   famotidine (PEPCID) 40 MG tablet TAKE ONE TABLET BY MOUTH AT BEDTIME   levothyroxine (SYNTHROID, LEVOTHROID) 75 MCG tablet Take 75 mcg by mouth daily before breakfast.   magnesium  gluconate (MAGONATE) 500 MG tablet Take 0.5 tablets (250 mg total) by mouth 2 (two) times daily.   Melatonin 10 MG TABS Take 10 mg by mouth at bedtime.    nitroGLYCERIN (NITROSTAT) 0.4 MG SL tablet Place 0.4 mg under the tongue every 5 (five) minutes as needed. For chest pains. May repeat for up to 3 doses.   PENTASA 500 MG CR capsule TAKE ONE CAPSULE BY MOUTH FOUR TIMES DAILY   simvastatin (ZOCOR) 10 MG tablet Take 10 mg by mouth at bedtime.   warfarin (COUMADIN) 2.5 MG tablet Take 2.5 mg by mouth as directed.   No facility-administered encounter medications on file as of 05/03/2021.    Objective: Blood pressure 133/73, pulse 93, temperature 98.3 F (36.8 C), height _0  (1.727 m), weight 155 lb (70.3 kg). Patient is alert and in no acute distress. He is wearing a mask. Conjunctiva is pink. Sclera is nonicteric Oropharyngeal mucosa is normal. No neck masses or thyromegaly noted. Cardiac exam with irregular rhythm normal S1 and S2. No murmur or gallop noted. Lungs are clear to auscultation. Abdomen is symmetrical.  He has upper midline scar.  Bowel sounds are hyperactive.  On palpation abdomen is soft and nontender with organomegaly or masses. He has trace to 1+ pitting edema involving both legs.  Labs/studies Results:   CBC Latest Ref Rng & Units 11/19/2020 04/29/2020 09/25/2019  WBC 4.0 -  10.5 K/uL 9.4 - 12.3(H)  Hemoglobin 13.0 - 17.0 g/dL 14.5 13.3 12.2(L)  Hematocrit 39.0 - 52.0 % 43.7 39.0 37.1(L)  Platelets 150 - 400 K/uL 199 - 258    CMP Latest Ref Rng & Units 11/19/2020 04/29/2020 09/25/2019  Glucose 70 - 99 mg/dL 132(H) 95 97  BUN 8 - 23 mg/dL _0 Creatinine 0.61 - 1.24 mg/dL 1.35(H) 1.70(H) 0.95  Sodium 135 - 145 mmol/L 136 141 137  Potassium 3.5 - 5.1 mmol/L 3.5 4.2 4.3  Chloride 98 - 111 mmol/L 100 105 102  CO2 22 - 32 mmol/L 26 - 27  Calcium 8.9 - 10.3 mg/dL 9.5 - 8.4(L)  Total Protein 6.5 - 8.1 g/dL 6.5 - 5.7(L)  Total Bilirubin 0.3 - 1.2 mg/dL 1.1 - 0.4   Alkaline Phos 38 - 126 U/L 67 - -  AST 15 - 41 U/L 24 - 16  ALT 0 - 44 U/L 34 - 21    Hepatic Function Latest Ref Rng & Units 11/19/2020 09/25/2019 06/15/2017  Total Protein 6.5 - 8.1 g/dL 6.5 5.7(L) 6.2  Albumin 3.5 - 5.0 g/dL 3.6 - 3.9  AST 15 - 41 U/L _1 ALT 0 - 44 U/L 34 21 10  Alk Phosphatase 38 - 126 U/L 67 - 65  Total Bilirubin 0.3 - 1.2 mg/dL 1.1 0.4 0.6  Bilirubin, Direct <=0.2 mg/dL - - 0.1    Lab data from 04/05/2021  WBC 5.4, H&H 12.6 and 39.4 and platelet count 165K  Glucose 93, BUN 11, creatinine 1.157 sodium 144, potassium 3.7, chloride 107, CO2 22 Serum calcium 9.1 Bilirubin 0.6, AP 58, AST 17, ALT 11, total protein 6.1 and albumin 4.2. TSH 0.47 Serum magnesium 1.6    Assessment:  #1.  Crohn's disease involving distal small bowel.  He has stricture involving neoterminal ileum.  He is status post small bowel resection in October 2020.  Patient has been on budesonide previously and on Pentasa.  He is tolerating Humira/adalimumab which was begun 2 months ago.  He is presently not having any symptoms suggest active disease.  #2.  GERD.  He is on famotidine 40 mg at bedtime.  We will try him on lesser dose and if he does well we will change the prescription.  #3.  Defecation disorder.  He appears to be doing better than he has before.  50% of his bowel movements are spontaneous and rest and use with suppository.  #4.  Mild anemia.  Hemoglobin 5 months ago was 14.5 and now it is 12.6.  No evidence of GI bleed.  CBC will be repeated at the time of next visit.  Plan:  Try famotidine OTC 20 mg at bedtime instead of 40 mg.  If it works we will change prescription. Discontinue Pentasa when current prescription runs out. Continue Humira/adalimumab with current dose of 40 mg subcu every 14 days. Office visit in 3 months.

## 2021-05-03 NOTE — Patient Instructions (Signed)
Can try Pepcid/famotidine OTC 20 mg at bedtime instead of 40 mg at bedtime.  If 20 does not work can go back to 40 mg. Stop Pentasa when prescription runs out next week.

## 2021-05-05 ENCOUNTER — Ambulatory Visit (INDEPENDENT_AMBULATORY_CARE_PROVIDER_SITE_OTHER): Payer: PPO | Admitting: *Deleted

## 2021-05-05 DIAGNOSIS — I4891 Unspecified atrial fibrillation: Secondary | ICD-10-CM

## 2021-05-05 DIAGNOSIS — Z5181 Encounter for therapeutic drug level monitoring: Secondary | ICD-10-CM

## 2021-05-05 DIAGNOSIS — M9903 Segmental and somatic dysfunction of lumbar region: Secondary | ICD-10-CM | POA: Diagnosis not present

## 2021-05-05 DIAGNOSIS — M47816 Spondylosis without myelopathy or radiculopathy, lumbar region: Secondary | ICD-10-CM | POA: Diagnosis not present

## 2021-05-05 LAB — POCT INR: INR: 2.1 (ref 2.0–3.0)

## 2021-05-05 NOTE — Patient Instructions (Signed)
Continue warfarin 1 tablet daily except 1 1/2 tablets on Sundays.   Recheck in 4 weeks

## 2021-05-10 DIAGNOSIS — M9903 Segmental and somatic dysfunction of lumbar region: Secondary | ICD-10-CM | POA: Diagnosis not present

## 2021-05-10 DIAGNOSIS — M47816 Spondylosis without myelopathy or radiculopathy, lumbar region: Secondary | ICD-10-CM | POA: Diagnosis not present

## 2021-05-12 DIAGNOSIS — H01001 Unspecified blepharitis right upper eyelid: Secondary | ICD-10-CM | POA: Diagnosis not present

## 2021-05-12 DIAGNOSIS — M47816 Spondylosis without myelopathy or radiculopathy, lumbar region: Secondary | ICD-10-CM | POA: Diagnosis not present

## 2021-05-12 DIAGNOSIS — H2511 Age-related nuclear cataract, right eye: Secondary | ICD-10-CM | POA: Diagnosis not present

## 2021-05-12 DIAGNOSIS — H25812 Combined forms of age-related cataract, left eye: Secondary | ICD-10-CM | POA: Diagnosis not present

## 2021-05-12 DIAGNOSIS — M9903 Segmental and somatic dysfunction of lumbar region: Secondary | ICD-10-CM | POA: Diagnosis not present

## 2021-05-12 DIAGNOSIS — H01002 Unspecified blepharitis right lower eyelid: Secondary | ICD-10-CM | POA: Diagnosis not present

## 2021-05-17 DIAGNOSIS — M47816 Spondylosis without myelopathy or radiculopathy, lumbar region: Secondary | ICD-10-CM | POA: Diagnosis not present

## 2021-05-17 DIAGNOSIS — M9903 Segmental and somatic dysfunction of lumbar region: Secondary | ICD-10-CM | POA: Diagnosis not present

## 2021-05-20 ENCOUNTER — Encounter (INDEPENDENT_AMBULATORY_CARE_PROVIDER_SITE_OTHER): Payer: Self-pay

## 2021-05-23 DIAGNOSIS — I482 Chronic atrial fibrillation, unspecified: Secondary | ICD-10-CM | POA: Diagnosis not present

## 2021-05-23 DIAGNOSIS — H1130 Conjunctival hemorrhage, unspecified eye: Secondary | ICD-10-CM | POA: Diagnosis not present

## 2021-05-23 DIAGNOSIS — Z6823 Body mass index (BMI) 23.0-23.9, adult: Secondary | ICD-10-CM | POA: Diagnosis not present

## 2021-05-24 DIAGNOSIS — M9903 Segmental and somatic dysfunction of lumbar region: Secondary | ICD-10-CM | POA: Diagnosis not present

## 2021-05-24 DIAGNOSIS — M47816 Spondylosis without myelopathy or radiculopathy, lumbar region: Secondary | ICD-10-CM | POA: Diagnosis not present

## 2021-05-26 DIAGNOSIS — M47816 Spondylosis without myelopathy or radiculopathy, lumbar region: Secondary | ICD-10-CM | POA: Diagnosis not present

## 2021-05-26 DIAGNOSIS — M9903 Segmental and somatic dysfunction of lumbar region: Secondary | ICD-10-CM | POA: Diagnosis not present

## 2021-05-31 ENCOUNTER — Ambulatory Visit (INDEPENDENT_AMBULATORY_CARE_PROVIDER_SITE_OTHER): Payer: PPO | Admitting: *Deleted

## 2021-05-31 DIAGNOSIS — M9903 Segmental and somatic dysfunction of lumbar region: Secondary | ICD-10-CM | POA: Diagnosis not present

## 2021-05-31 DIAGNOSIS — Z5181 Encounter for therapeutic drug level monitoring: Secondary | ICD-10-CM | POA: Diagnosis not present

## 2021-05-31 DIAGNOSIS — I4891 Unspecified atrial fibrillation: Secondary | ICD-10-CM | POA: Diagnosis not present

## 2021-05-31 DIAGNOSIS — M47816 Spondylosis without myelopathy or radiculopathy, lumbar region: Secondary | ICD-10-CM | POA: Diagnosis not present

## 2021-05-31 LAB — POCT INR: INR: 2 (ref 2.0–3.0)

## 2021-05-31 NOTE — Patient Instructions (Signed)
Take warfarin 1 1/2 tablets tonight then resume 1 tablet daily except 1 1/2 tablets on Sundays.   Pending cataract surgery 06/27/21 at [facility] in 3 weeks

## 2021-06-02 DIAGNOSIS — M9903 Segmental and somatic dysfunction of lumbar region: Secondary | ICD-10-CM | POA: Diagnosis not present

## 2021-06-02 DIAGNOSIS — M47816 Spondylosis without myelopathy or radiculopathy, lumbar region: Secondary | ICD-10-CM | POA: Diagnosis not present

## 2021-06-07 DIAGNOSIS — M9903 Segmental and somatic dysfunction of lumbar region: Secondary | ICD-10-CM | POA: Diagnosis not present

## 2021-06-07 DIAGNOSIS — M47816 Spondylosis without myelopathy or radiculopathy, lumbar region: Secondary | ICD-10-CM | POA: Diagnosis not present

## 2021-06-09 DIAGNOSIS — M47816 Spondylosis without myelopathy or radiculopathy, lumbar region: Secondary | ICD-10-CM | POA: Diagnosis not present

## 2021-06-09 DIAGNOSIS — M9903 Segmental and somatic dysfunction of lumbar region: Secondary | ICD-10-CM | POA: Diagnosis not present

## 2021-06-14 DIAGNOSIS — M9903 Segmental and somatic dysfunction of lumbar region: Secondary | ICD-10-CM | POA: Diagnosis not present

## 2021-06-14 DIAGNOSIS — M47816 Spondylosis without myelopathy or radiculopathy, lumbar region: Secondary | ICD-10-CM | POA: Diagnosis not present

## 2021-06-15 DIAGNOSIS — N183 Chronic kidney disease, stage 3 unspecified: Secondary | ICD-10-CM | POA: Diagnosis not present

## 2021-06-15 DIAGNOSIS — E7849 Other hyperlipidemia: Secondary | ICD-10-CM | POA: Diagnosis not present

## 2021-06-15 DIAGNOSIS — I129 Hypertensive chronic kidney disease with stage 1 through stage 4 chronic kidney disease, or unspecified chronic kidney disease: Secondary | ICD-10-CM | POA: Diagnosis not present

## 2021-06-15 DIAGNOSIS — I482 Chronic atrial fibrillation, unspecified: Secondary | ICD-10-CM | POA: Diagnosis not present

## 2021-06-15 DIAGNOSIS — I2510 Atherosclerotic heart disease of native coronary artery without angina pectoris: Secondary | ICD-10-CM | POA: Diagnosis not present

## 2021-06-16 DIAGNOSIS — M47816 Spondylosis without myelopathy or radiculopathy, lumbar region: Secondary | ICD-10-CM | POA: Diagnosis not present

## 2021-06-16 DIAGNOSIS — M9903 Segmental and somatic dysfunction of lumbar region: Secondary | ICD-10-CM | POA: Diagnosis not present

## 2021-06-21 DIAGNOSIS — M47816 Spondylosis without myelopathy or radiculopathy, lumbar region: Secondary | ICD-10-CM | POA: Diagnosis not present

## 2021-06-21 DIAGNOSIS — M9903 Segmental and somatic dysfunction of lumbar region: Secondary | ICD-10-CM | POA: Diagnosis not present

## 2021-06-22 NOTE — H&P (Signed)
Surgical History & Physical  Patient Name: [name] DOB: 1931-02-17  Surgery: Cataract extraction with intraocular lens implant phacoemulsification; Right Eye  Surgeon: Baruch Goldmann MD Surgery Date:  06/27/2021 Pre-Op Date:  06/22/2021  HPI: A 3 Yr. old male patient Pt referred by Dr. Jorja Loa for cataract evaluation. The patient complains of difficulty when viewing TV, reading closed caption, news scrolls on TV, which began 6+ years ago. Both eyes are affected. The episode is gradual. The condition's severity is worsening. Pt states he has been evaluated 2 times for cataract and was never ready or prepared for sx. Pt states glare on bright days could be bothersome. Pt does not go out at night. Symptoms are negatively affecting pt's quality of life. Pt using Systane complete prn OU. Pt denies any increase in floaters/flashes of light. HPI was performed by Baruch Goldmann .  Medical History: Dry Eyes Cataracts Chorioretinal Scar OD Crohn's disease Acid Reflux Heart Problem High Blood Pressure LDL  Review of Systems Negative Allergic/Immunologic Negative Cardiovascular Negative Constitutional Negative Ear, Nose, Mouth & Throat Negative Endocrine Negative Eyes Negative Gastrointestinal Negative Genitourinary Negative Hemotologic/Lymphatic Negative Integumentary Negative Musculoskeletal Negative Neurological Negative Psychiatry Negative Respiratory  Social   Never smoked    Medication Systane Complete,  Fluticasone, Diltiazem, Nitroglycerin, Acetaminophen, Carvedilol, Famotidine, Levothyroxine, Melatonin, Simvastatin,   Sx/Procedures  None  Drug Allergies   NKDA  History & Physical: Heent:  Cataract, Right eye NECK: supple without bruits LUNGS: lungs clear to auscultation CV: regular rate and rhythm Abdomen: soft and non-tender  Impression & Plan: Assessment: 1.  NUCLEAR SCLEROSIS AGE RELATED; Right Eye (H25.11) 2.  COMBINED FORMS AGE RELATED CATARACT; Left  Eye (H25.812) 3.  BLEPHARITIS; Right Upper Lid, Right Lower Lid, Left Upper Lid, Left Lower Lid (H01.001, H01.002,H01.004,H01.005)  Plan: 1.  Cataract accounts for the patient's decreased vision. This visual impairment is not correctable with a tolerable change in glasses or contact lenses. Cataract surgery with an implantation of a new lens should significantly improve the visual and functional status of the patient. Discussed all risks, benefits, alternatives, and potential complications. Discussed the procedures and recovery. Patient desires to have surgery. A-scan ordered and performed today for intra-ocular lens calculations. The surgery will be performed in order to improve vision for driving, reading, and for eye examinations. Recommend phacoemulsification with intra-ocular lens. Recommend Dextenza for post-operative pain and inflammation. Right Eye worse - first. Dilates well - shugarcaine by protocol. Goal -2.00 OU.  2.  Will address after right eye.  3.  Recommended regular lid cleaning.

## 2021-06-23 ENCOUNTER — Encounter (HOSPITAL_COMMUNITY)
Admission: RE | Admit: 2021-06-23 | Discharge: 2021-06-23 | Disposition: A | Discharge status: Home / Self Care | Payer: PPO | Source: Ambulatory Visit | Attending: Ophthalmology | Admitting: Ophthalmology

## 2021-06-23 ENCOUNTER — Other Ambulatory Visit: Payer: Self-pay

## 2021-06-23 DIAGNOSIS — H25811 Combined forms of age-related cataract, right eye: Secondary | ICD-10-CM | POA: Diagnosis not present

## 2021-06-23 DIAGNOSIS — M9903 Segmental and somatic dysfunction of lumbar region: Secondary | ICD-10-CM | POA: Diagnosis not present

## 2021-06-23 DIAGNOSIS — M47816 Spondylosis without myelopathy or radiculopathy, lumbar region: Secondary | ICD-10-CM | POA: Diagnosis not present

## 2021-06-23 NOTE — Patient Instructions (Addendum)
[name]  06/23/2021     @PREFPERIOPPHARMACY @   Your procedure is scheduled on 06/27/2021.   Report to [facility] at  0900 A.M.   Call this number if you have problems the morning of surgery:  [phone]   Remember:  Do not eat or drink after midnight.      Take these medicines the morning of surgery with A SIP OF WATER                  carvedilol,Diltiazem, levothyroxine.     Do not wear jewelry, make-up or nail polish.  Do not wear lotions, powders, or perfumes, or deodorant.  Do not shave 48 hours prior to surgery.  Men may shave face and neck.  Do not bring valuables to the hospital.  [facility] is not responsible for any belongings or valuables.  Contacts, dentures or bridgework may not be worn into surgery.  Leave your suitcase in the car.  After surgery it may be brought to your room.  For patients admitted to the hospital, discharge time will be determined by your treatment team.  Patients discharged the day of surgery will not be allowed to drive home and must have someone with them for 24 hours.    Special instructions:   DO NOT smoke tobacco or vape for 24 hours before your procedure.  Please read over the following fact sheets that you were given. Anesthesia Post-op Instructions and Care and Recovery After Surgery      Cataract Surgery, Care After This sheet gives you information about how to care for yourself after your procedure. Your health care provider may also give you more specific instructions. If you have problems or questions, contact your health care provider. What can I expect after the procedure? After the procedure, it is common to have: Itching. Discomfort. Fluid discharge. Sensitivity to light and to touch. Bruising in or around the eye. Mild blurred vision. Follow these instructions at home: Eye care  Do not touch or rub your eyes. Protect your eyes as told by your health care provider. You may be told to wear a  protective eye shield or sunglasses. Do not put a contact lens into the affected eye or eyes until your health care provider approves. Keep the area around your eye clean and dry: Avoid swimming. Do not allow water to hit you directly in the face while showering. Keep soap and shampoo out of your eyes. Check your eye every day for signs of infection. Watch for: Redness, swelling, or pain. Fluid, blood, or pus. Warmth. A bad smell. Vision that is getting worse. Sensitivity that is getting worse. Activity Do not drive for 24 hours if you were given a sedative during your procedure. Avoid strenuous activities, such as playing contact sports, for as long as told by your health care provider. Do not drive or use heavy machinery until your health care provider approves. Do not bend or lift heavy objects. Bending increases pressure in the eye. You can walk, climb stairs, and do light household chores. Ask your health care provider when you can return to work. If you work in a dusty environment, you may be advised to wear protective eyewear for a period of time. General instructions Take or apply over-the-counter and prescription medicines only as told by your health care provider. This includes eye drops. Keep all follow-up visits as told by your health care provider. This is important. Contact a health care provider if:  You have increased bruising around your eye. You have pain that is not helped with medicine. You have a fever. You have redness, swelling, or pain in your eye. You have fluid, blood, or pus coming from your incision. Your vision gets worse. Your sensitivity to light gets worse. Get help right away if: You have sudden loss of vision. You see flashes of light or spots (floaters). You have severe eye pain. You develop nausea or vomiting. Summary After your procedure, it is common to have itching, discomfort, bruising, fluid discharge, or sensitivity to light. Follow  instructions from your health care provider about caring for your eye after the procedure. Do not rub your eye after the procedure. You may need to wear eye protection or sunglasses. Do not wear contact lenses. Keep the area around your eye clean and dry. Avoid activities that require a lot of effort. These include playing sports and lifting heavy objects. Contact a health care provider if you have increased bruising, pain that does not go away, or a fever. Get help right away if you suddenly lose your vision, see flashes of light or spots, or have severe pain in the eye. This information is not intended to replace advice given to you by your health care provider. Make sure you discuss any questions you have with your health care provider. Document Revised: 07/29/2019 Document Reviewed: 04/01/2018 Elsevier Patient Education  [place].

## 2021-06-24 ENCOUNTER — Ambulatory Visit (INDEPENDENT_AMBULATORY_CARE_PROVIDER_SITE_OTHER): Payer: PPO | Admitting: *Deleted

## 2021-06-24 DIAGNOSIS — I4891 Unspecified atrial fibrillation: Secondary | ICD-10-CM | POA: Diagnosis not present

## 2021-06-24 DIAGNOSIS — Z5181 Encounter for therapeutic drug level monitoring: Secondary | ICD-10-CM | POA: Diagnosis not present

## 2021-06-24 LAB — POCT INR: INR: 2 (ref 2.0–3.0)

## 2021-06-24 NOTE — Patient Instructions (Signed)
Continue warfarin 1 tablet daily except 1 1/2 tablets on Sundays.   Pending cataract surgery 06/27/21 at [facility]  2nd eye 2 weeks after. Recheck in 3 weeks

## 2021-06-27 ENCOUNTER — Encounter (HOSPITAL_COMMUNITY)
Admission: RE | Disposition: A | Discharge status: Home / Self Care | Payer: Self-pay | Source: Ambulatory Visit | Attending: Ophthalmology

## 2021-06-27 ENCOUNTER — Other Ambulatory Visit: Payer: Self-pay

## 2021-06-27 ENCOUNTER — Encounter (HOSPITAL_COMMUNITY): Payer: Self-pay | Admitting: Ophthalmology

## 2021-06-27 ENCOUNTER — Ambulatory Visit (HOSPITAL_COMMUNITY): Payer: PPO | Admitting: Certified Registered"

## 2021-06-27 ENCOUNTER — Ambulatory Visit (HOSPITAL_COMMUNITY)
Admission: RE | Admit: 2021-06-27 | Discharge: 2021-06-27 | Disposition: A | Discharge status: Home / Self Care | Payer: PPO | Source: Ambulatory Visit | Attending: Ophthalmology | Admitting: Ophthalmology

## 2021-06-27 DIAGNOSIS — Z79899 Other long term (current) drug therapy: Secondary | ICD-10-CM | POA: Diagnosis not present

## 2021-06-27 DIAGNOSIS — H25811 Combined forms of age-related cataract, right eye: Secondary | ICD-10-CM | POA: Diagnosis not present

## 2021-06-27 DIAGNOSIS — I2510 Atherosclerotic heart disease of native coronary artery without angina pectoris: Secondary | ICD-10-CM | POA: Diagnosis not present

## 2021-06-27 DIAGNOSIS — H2511 Age-related nuclear cataract, right eye: Secondary | ICD-10-CM | POA: Diagnosis not present

## 2021-06-27 DIAGNOSIS — H25812 Combined forms of age-related cataract, left eye: Secondary | ICD-10-CM | POA: Diagnosis not present

## 2021-06-27 DIAGNOSIS — H0100B Unspecified blepharitis left eye, upper and lower eyelids: Secondary | ICD-10-CM | POA: Insufficient documentation

## 2021-06-27 DIAGNOSIS — H0100A Unspecified blepharitis right eye, upper and lower eyelids: Secondary | ICD-10-CM | POA: Insufficient documentation

## 2021-06-27 HISTORY — PX: CATARACT EXTRACTION W/PHACO: SHX586

## 2021-06-27 SURGERY — PHACOEMULSIFICATION, CATARACT, WITH IOL INSERTION
Anesthesia: Monitor Anesthesia Care | Site: Eye | Laterality: Right

## 2021-06-27 MED ORDER — EPINEPHRINE PF 1 MG/ML IJ SOLN
INTRAMUSCULAR | Status: AC
  Filled 2021-06-27: qty 2

## 2021-06-27 MED ORDER — PHENYLEPHRINE HCL 2.5 % OP SOLN
1.0000 [drp] | OPHTHALMIC | Status: AC | PRN
  Administered 2021-06-27 (×3): 1 [drp] via OPHTHALMIC

## 2021-06-27 MED ORDER — EPINEPHRINE PF 1 MG/ML IJ SOLN
INTRAOCULAR | Status: DC | PRN
  Administered 2021-06-27: 500 mL

## 2021-06-27 MED ORDER — SODIUM HYALURONATE 10 MG/ML IO SOLUTION
PREFILLED_SYRINGE | INTRAOCULAR | Status: DC | PRN
  Administered 2021-06-27: 0.85 mL via INTRAOCULAR

## 2021-06-27 MED ORDER — POVIDONE-IODINE 5 % OP SOLN
OPHTHALMIC | Status: DC | PRN
  Administered 2021-06-27: 1 via OPHTHALMIC

## 2021-06-27 MED ORDER — TETRACAINE HCL 0.5 % OP SOLN
1.0000 [drp] | OPHTHALMIC | Status: AC | PRN
  Administered 2021-06-27 (×3): 1 [drp] via OPHTHALMIC

## 2021-06-27 MED ORDER — STERILE WATER FOR IRRIGATION IR SOLN
Status: DC | PRN
  Administered 2021-06-27: 250 mL

## 2021-06-27 MED ORDER — LIDOCAINE HCL 3.5 % OP GEL
1.0000 "application " | Freq: Once | OPHTHALMIC | Status: AC
  Administered 2021-06-27: 1 via OPHTHALMIC

## 2021-06-27 MED ORDER — NEOMYCIN-POLYMYXIN-DEXAMETH 3.5-10000-0.1 OP SUSP
OPHTHALMIC | Status: DC | PRN
  Administered 2021-06-27: 1 [drp] via OPHTHALMIC

## 2021-06-27 MED ORDER — TROPICAMIDE 1 % OP SOLN
1.0000 [drp] | OPHTHALMIC | Status: AC
  Administered 2021-06-27 (×3): 1 [drp] via OPHTHALMIC

## 2021-06-27 MED ORDER — LIDOCAINE HCL (PF) 1 % IJ SOLN
INTRAOCULAR | Status: DC | PRN
  Administered 2021-06-27: 1 mL via OPHTHALMIC

## 2021-06-27 MED ORDER — BSS IO SOLN
INTRAOCULAR | Status: DC | PRN
  Administered 2021-06-27: 15 mL via INTRAOCULAR

## 2021-06-27 MED ORDER — SODIUM HYALURONATE 23MG/ML IO SOSY
PREFILLED_SYRINGE | INTRAOCULAR | Status: DC | PRN
  Administered 2021-06-27: 0.6 mL via INTRAOCULAR

## 2021-06-27 SURGICAL SUPPLY — 12 items
CLOTH BEACON ORANGE TIMEOUT ST (SAFETY) ×1 IMPLANT
EYE SHIELD UNIVERSAL CLEAR (GAUZE/BANDAGES/DRESSINGS) ×1 IMPLANT
GLOVE SURG UNDER POLY LF SZ6.5 (GLOVE) ×1 IMPLANT
GLOVE SURG UNDER POLY LF SZ7 (GLOVE) ×1 IMPLANT
NDL HYPO 18GX1.5 BLUNT FILL (NEEDLE) IMPLANT
NEEDLE HYPO 18GX1.5 BLUNT FILL (NEEDLE) ×2 IMPLANT
PAD ARMBOARD 7.5X6 YLW CONV (MISCELLANEOUS) ×1 IMPLANT
RayOne EMV US (Intraocular Lens) ×1 IMPLANT
SYR TB 1ML LL NO SAFETY (SYRINGE) ×1 IMPLANT
TAPE SURG TRANSPORE 1 IN (GAUZE/BANDAGES/DRESSINGS) IMPLANT
TAPE SURGICAL TRANSPORE 1 IN (GAUZE/BANDAGES/DRESSINGS) ×2
WATER STERILE IRR 250ML POUR (IV SOLUTION) ×1 IMPLANT

## 2021-06-27 NOTE — Interval H&P Note (Signed)
History and Physical Interval Note:  06/27/2021 9:44 AM  [name]  has presented today for surgery, with the diagnosis of Nuclear sclerotic cataract - Right eye.  The various methods of treatment have been discussed with the patient and family. After consideration of risks, benefits and other options for treatment, the patient has consented to  Procedure(s) with comments: CATARACT EXTRACTION PHACO AND INTRAOCULAR LENS PLACEMENT (IOC) (Right) - right as a surgical intervention.  The patient's history has been reviewed, patient examined, no change in status, stable for surgery.  I have reviewed the patient's chart and labs.  Questions were answered to the patient's satisfaction.     Baruch Goldmann

## 2021-06-27 NOTE — Anesthesia Preprocedure Evaluation (Addendum)
Anesthesia Evaluation  Patient identified by MRN, date of birth, ID band Patient awake    Reviewed: Allergy & Precautions, H&P , NPO status , Patient's Chart, lab work & pertinent test results, reviewed documented beta blocker date and time   Airway Mallampati: II  TM Distance: >3 FB Neck ROM: full    Dental no notable dental hx.    Pulmonary neg pulmonary ROS,    Pulmonary exam normal breath sounds clear to auscultation       Cardiovascular Exercise Tolerance: Good + CAD   Rhythm:regular Rate:Normal     Neuro/Psych negative neurological ROS  negative psych ROS   GI/Hepatic Neg liver ROS, GERD  Medicated,  Endo/Other  Hypothyroidism   Renal/GU negative Renal ROS  negative genitourinary   Musculoskeletal   Abdominal   Peds  Hematology  (+) Blood dyscrasia, anemia ,   Anesthesia Other Findings   Reproductive/Obstetrics negative OB ROS                             Anesthesia Physical Anesthesia Plan  ASA: 3  Anesthesia Plan: MAC   Post-op Pain Management:    Induction:   PONV Risk Score and Plan:   Airway Management Planned:   Additional Equipment:   Intra-op Plan:   Post-operative Plan:   Informed Consent: I have reviewed the patients History and Physical, chart, labs and discussed the procedure including the risks, benefits and alternatives for the proposed anesthesia with the patient or authorized representative who has indicated his/her understanding and acceptance.     Dental Advisory Given  Plan Discussed with: CRNA  Anesthesia Plan Comments:        Anesthesia Quick Evaluation

## 2021-06-27 NOTE — Discharge Instructions (Signed)
Please discharge patient when stable, will follow up today with Dr. Yazan Gatling at the [facility] [place] office immediately following discharge.  Leave shield in place until visit.  All paperwork with discharge instructions will be given at the office.  [facility] [place] Address:  [address]  [place], [place] [address]  

## 2021-06-27 NOTE — Transfer of Care (Signed)
Immediate Anesthesia Transfer of Care Note  Patient: [name]  Procedure(s) Performed: CATARACT EXTRACTION PHACO AND INTRAOCULAR LENS PLACEMENT (IOC) (Right: Eye)  Patient Location: Short Stay  Anesthesia Type:MAC  Level of Consciousness: awake, alert  and oriented  Airway & Oxygen Therapy: Patient Spontanous Breathing  Post-op Assessment: Report given to RN, Post -op Vital signs reviewed and stable and Patient moving all extremities X 4  Post vital signs: Reviewed and stable  Last Vitals:  Vitals Value Taken Time  BP    Temp    Pulse    Resp    SpO2      Last Pain:  Vitals:   06/27/21 0906  TempSrc: Oral  PainSc: 0-No pain      Patients Stated Pain Goal: 6 (85/27/78 2423)  Complications: No notable events documented.

## 2021-06-27 NOTE — Anesthesia Postprocedure Evaluation (Signed)
Anesthesia Post Note  Patient: [name]  Procedure(s) Performed: CATARACT EXTRACTION PHACO AND INTRAOCULAR LENS PLACEMENT (IOC) (Right: Eye)  Patient location during evaluation: Phase II Anesthesia Type: MAC Level of consciousness: awake Pain management: pain level controlled Vital Signs Assessment: post-procedure vital signs reviewed and stable Respiratory status: spontaneous breathing and respiratory function stable Cardiovascular status: blood pressure returned to baseline and stable Postop Assessment: no headache and no apparent nausea or vomiting Anesthetic complications: no Comments: Late entry   No notable events documented.   Last Vitals:  Vitals:   06/27/21 0906 06/27/21 1008  BP: (!) 157/72 (!) 151/74  Pulse: 73 61  Resp: 17 18  Temp: 36.6 C 36.4 C  SpO2: 99% 98%    Last Pain:  Vitals:   06/27/21 1008  TempSrc: Axillary  PainSc: 0-No pain                 Louann Sjogren

## 2021-06-27 NOTE — Op Note (Signed)
Date of procedure: 06/27/21  Pre-operative diagnosis: Visually significant age-related nuclear cataract, Right Eye (H25.11)  Post-operative diagnosis: Visually significant age-related nuclear cataract, Right Eye  Procedure: Removal of cataract via phacoemulsification and insertion of intra-ocular lens Rayner [id number] +17.0D into the capsular bag of the Right Eye  Attending surgeon: Gerda Diss. Alexxia Stankiewicz, MD, MA  Anesthesia: MAC, Topical Akten  Complications: None  Estimated Blood Loss: <57m (minimal)  Specimens: None  Implants: As above  Indications:  Visually significant age-related cataract, Right Eye  Procedure:  The patient was seen and identified in the pre-operative area. The operative eye was identified and dilated.  The operative eye was marked.  Topical anesthesia was administered to the operative eye.     The patient was then to the operative suite and placed in the supine position.  A timeout was performed confirming the patient, procedure to be performed, and all other relevant information.   The patient's face was prepped and draped in the usual fashion for intra-ocular surgery.  A lid speculum was placed into the operative eye and the surgical microscope moved into place and focused.  A superotemporal paracentesis was created using a 20 gauge paracentesis blade.  Shugarcaine was injected into the anterior chamber.  Viscoelastic was injected into the anterior chamber.  A temporal clear-corneal main wound incision was created using a 2.486mmicrokeratome.  A continuous curvilinear capsulorrhexis was initiated using an irrigating cystitome and completed using capsulorrhexis forceps.  Hydrodissection and hydrodeliniation were performed.  Viscoelastic was injected into the anterior chamber.  A phacoemulsification handpiece and a chopper as a second instrument were used to remove the nucleus and epinucleus. The irrigation/aspiration handpiece was used to remove any remaining cortical  material.   The capsular bag was reinflated with viscoelastic, checked, and found to be intact.  The intraocular lens was inserted into the capsular bag.  The irrigation/aspiration handpiece was used to remove any remaining viscoelastic.  The clear corneal wound and paracentesis wounds were then hydrated and checked with Weck-Cels to be watertight.  The lid-speculum and drape was removed, and the patient's face was cleaned with a wet and dry 4x4.  Maxitrol was instilled in the eye before a clear shield was taped over the eye. The patient was taken to the post-operative care unit in good condition, having tolerated the procedure well.  Post-Op Instructions: [facility] and will receive all other orders and instructions.

## 2021-06-27 NOTE — Anesthesia Procedure Notes (Signed)
Procedure Name: MAC Date/Time: 06/27/2021 9:51 AM Performed by: Orlie Dakin, CRNA Pre-anesthesia Checklist: Patient identified, Emergency Drugs available, Suction available and Patient being monitored Patient Re-evaluated:Patient Re-evaluated prior to induction Oxygen Delivery Method: Nasal cannula Placement Confirmation: positive ETCO2

## 2021-06-28 ENCOUNTER — Encounter (HOSPITAL_COMMUNITY): Payer: Self-pay | Admitting: Ophthalmology

## 2021-07-04 DIAGNOSIS — H25812 Combined forms of age-related cataract, left eye: Secondary | ICD-10-CM | POA: Diagnosis not present

## 2021-07-05 DIAGNOSIS — M9903 Segmental and somatic dysfunction of lumbar region: Secondary | ICD-10-CM | POA: Diagnosis not present

## 2021-07-05 DIAGNOSIS — M47816 Spondylosis without myelopathy or radiculopathy, lumbar region: Secondary | ICD-10-CM | POA: Diagnosis not present

## 2021-07-07 ENCOUNTER — Other Ambulatory Visit: Payer: Self-pay

## 2021-07-07 ENCOUNTER — Encounter (HOSPITAL_COMMUNITY)
Admission: RE | Admit: 2021-07-07 | Discharge: 2021-07-07 | Disposition: A | Discharge status: Home / Self Care | Payer: PPO | Source: Ambulatory Visit | Attending: Ophthalmology | Admitting: Ophthalmology

## 2021-07-07 ENCOUNTER — Encounter (HOSPITAL_COMMUNITY): Payer: Self-pay

## 2021-07-07 DIAGNOSIS — M9903 Segmental and somatic dysfunction of lumbar region: Secondary | ICD-10-CM | POA: Diagnosis not present

## 2021-07-07 DIAGNOSIS — M47816 Spondylosis without myelopathy or radiculopathy, lumbar region: Secondary | ICD-10-CM | POA: Diagnosis not present

## 2021-07-07 NOTE — H&P (Signed)
Surgical History & Physical  Patient Name: [name] DOB: 12/06/1930  Surgery: Cataract extraction with intraocular lens implant phacoemulsification; Left Eye  Surgeon: Baruch Goldmann MD Surgery Date:  07-11-21 Pre-Op Date: 07-04-21  HPI: A 65 Yr. old male patient The patient is returning after cataract surgery. The right eye is affected. Status post cataract surgery, which began 1 year ago: Since the last visit, the affected area is doing well. The patient's vision is improved and stable. Patient is following medication instructions. Pt taking PO purple top TID OD. Pt denies any increase in floaters/flashes of light. The patient complains of difficulty when viewing TV, reading closed caption, news scrolls on TV, which began 6+ years ago. The left eye is affected. The episode is gradual. The condition's severity is worsening. Pt states he has been evaluated 2 times for cataract and was never ready or prepared for sx. Pt states glare on bright days could be bothersome. Pt does not go out at night. Symptoms are negatively affecting pt's quality of life HPI Completed by Dr. Baruch Goldmann  Medical History: Dry Eyes Cataracts Chorioretinal Scar OD Macula Degeneration Glaucoma Crohn's disease Acid Reflux Heart Problem High Blood Pressure LDL  Review of Systems Negative Allergic/Immunologic Negative Cardiovascular Negative Constitutional Negative Ear, Nose, Mouth & Throat Negative Endocrine Negative Eyes Negative Gastrointestinal Negative Genitourinary Negative Hemotologic/Lymphatic Negative Integumentary Negative Musculoskeletal Negative Neurological Negative Psychiatry Negative Respiratory  Social   Never smoked   Medication Systane Complete, Prednisolone-Moxifloxacin-Bromfenac, Fluticasone, Diltiazem, Nitroglycerin, Acetaminophen, Carvedilol, Famotidine, Levothyroxine, Melatonin, Simvastatin,   Sx/Procedures Phaco c IOL OD,   Drug Allergies   NKDA  History &  Physical: Heent: Cataract, Left Eye NECK: supple without bruits LUNGS: lungs clear to auscultation CV: regular rate and rhythm Abdomen: soft and non-tender  Impression & Plan: Assessment: 1.  CATARACT EXTRACTION STATUS; Right Eye (Z98.41) 2.  INTRAOCULAR LENS IOL (Z96.1) 3.  COMBINED FORMS AGE RELATED CATARACT; Left Eye (H25.812)  Plan: 1.  1 week after cataract surgery. Doing well with improved vision and normal eye pressure. Call with any problems or concerns. Continue Pred-Moxi-Brom 2x/day for 3 more weeks.  2.  Doing well since surgery Continue Post-op medications  3.  Cataract accounts for the patient's decreased vision. This visual impairment is not correctable with a tolerable change in glasses or contact lenses. Cataract surgery with an implantation of a new lens should significantly improve the visual and functional status of the patient. Discussed all risks, benefits, alternatives, and potential complications. Discussed the procedures and recovery. Patient desires to have surgery. A-scan ordered and performed today for intra-ocular lens calculations. The surgery will be performed in order to improve vision for driving, reading, and for eye examinations. Recommend phacoemulsification with intra-ocular lens. Recommend Dextenza for post-operative pain and inflammation. Left Eye. Surgery required to correct imbalance of vision. Dilates well - shugarcaine by protocol. Near goal -2.00

## 2021-07-11 ENCOUNTER — Ambulatory Visit (HOSPITAL_COMMUNITY)
Admission: RE | Admit: 2021-07-11 | Discharge: 2021-07-11 | Disposition: A | Discharge status: Home / Self Care | Payer: PPO | Source: Ambulatory Visit | Attending: Ophthalmology | Admitting: Ophthalmology

## 2021-07-11 ENCOUNTER — Ambulatory Visit (HOSPITAL_COMMUNITY): Payer: PPO | Admitting: Anesthesiology

## 2021-07-11 ENCOUNTER — Encounter (HOSPITAL_COMMUNITY): Payer: Self-pay | Admitting: Ophthalmology

## 2021-07-11 ENCOUNTER — Encounter (HOSPITAL_COMMUNITY)
Admission: RE | Disposition: A | Discharge status: Home / Self Care | Payer: Self-pay | Source: Ambulatory Visit | Attending: Ophthalmology

## 2021-07-11 DIAGNOSIS — H25812 Combined forms of age-related cataract, left eye: Secondary | ICD-10-CM | POA: Insufficient documentation

## 2021-07-11 DIAGNOSIS — I2510 Atherosclerotic heart disease of native coronary artery without angina pectoris: Secondary | ICD-10-CM | POA: Diagnosis not present

## 2021-07-11 DIAGNOSIS — Z961 Presence of intraocular lens: Secondary | ICD-10-CM | POA: Insufficient documentation

## 2021-07-11 DIAGNOSIS — Z79890 Hormone replacement therapy (postmenopausal): Secondary | ICD-10-CM | POA: Diagnosis not present

## 2021-07-11 DIAGNOSIS — Z9841 Cataract extraction status, right eye: Secondary | ICD-10-CM | POA: Insufficient documentation

## 2021-07-11 DIAGNOSIS — Z79899 Other long term (current) drug therapy: Secondary | ICD-10-CM | POA: Insufficient documentation

## 2021-07-11 HISTORY — PX: CATARACT EXTRACTION W/PHACO: SHX586

## 2021-07-11 SURGERY — PHACOEMULSIFICATION, CATARACT, WITH IOL INSERTION
Anesthesia: Monitor Anesthesia Care | Site: Eye | Laterality: Left

## 2021-07-11 MED ORDER — NEOMYCIN-POLYMYXIN-DEXAMETH 3.5-10000-0.1 OP SUSP
OPHTHALMIC | Status: DC | PRN
  Administered 2021-07-11: 1 [drp] via OPHTHALMIC

## 2021-07-11 MED ORDER — PHENYLEPHRINE HCL 2.5 % OP SOLN
1.0000 [drp] | OPHTHALMIC | Status: AC | PRN
  Administered 2021-07-11 (×3): 1 [drp] via OPHTHALMIC

## 2021-07-11 MED ORDER — TROPICAMIDE 1 % OP SOLN
1.0000 [drp] | OPHTHALMIC | Status: AC
  Administered 2021-07-11: 1 [drp] via OPHTHALMIC

## 2021-07-11 MED ORDER — SODIUM HYALURONATE 10 MG/ML IO SOLUTION
PREFILLED_SYRINGE | INTRAOCULAR | Status: DC | PRN
  Administered 2021-07-11: 0.85 mL via INTRAOCULAR

## 2021-07-11 MED ORDER — BSS IO SOLN
INTRAOCULAR | Status: DC | PRN
  Administered 2021-07-11: 15 mL via INTRAOCULAR

## 2021-07-11 MED ORDER — TROPICAMIDE 1 % OP SOLN
1.0000 [drp] | OPHTHALMIC | Status: AC
Start: 2021-07-11 — End: 2021-07-11
  Administered 2021-07-11 (×2): 1 [drp] via OPHTHALMIC

## 2021-07-11 MED ORDER — PHENYLEPHRINE-KETOROLAC 1-0.3 % IO SOLN
INTRAOCULAR | Status: AC
  Filled 2021-07-11: qty 4

## 2021-07-11 MED ORDER — POVIDONE-IODINE 5 % OP SOLN
OPHTHALMIC | Status: DC | PRN
  Administered 2021-07-11: 1 via OPHTHALMIC

## 2021-07-11 MED ORDER — LIDOCAINE HCL (PF) 1 % IJ SOLN
INTRAOCULAR | Status: DC | PRN
  Administered 2021-07-11: 1 mL via OPHTHALMIC

## 2021-07-11 MED ORDER — LIDOCAINE HCL 3.5 % OP GEL
1.0000 "application " | Freq: Once | OPHTHALMIC | Status: AC
  Administered 2021-07-11: 1 via OPHTHALMIC

## 2021-07-11 MED ORDER — EPINEPHRINE PF 1 MG/ML IJ SOLN
INTRAMUSCULAR | Status: AC
  Filled 2021-07-11: qty 2

## 2021-07-11 MED ORDER — STERILE WATER FOR IRRIGATION IR SOLN
Status: DC | PRN
  Administered 2021-07-11: 250 mL

## 2021-07-11 MED ORDER — EPINEPHRINE PF 1 MG/ML IJ SOLN
INTRAOCULAR | Status: DC | PRN
  Administered 2021-07-11: 500 mL

## 2021-07-11 MED ORDER — SODIUM HYALURONATE 23MG/ML IO SOSY
PREFILLED_SYRINGE | INTRAOCULAR | Status: DC | PRN
  Administered 2021-07-11: 0.6 mL via INTRAOCULAR

## 2021-07-11 MED ORDER — TETRACAINE HCL 0.5 % OP SOLN
1.0000 [drp] | OPHTHALMIC | Status: AC | PRN
  Administered 2021-07-11 (×3): 1 [drp] via OPHTHALMIC

## 2021-07-11 SURGICAL SUPPLY — 12 items
CLOTH BEACON ORANGE TIMEOUT ST (SAFETY) ×1 IMPLANT
EYE SHIELD UNIVERSAL CLEAR (GAUZE/BANDAGES/DRESSINGS) ×1 IMPLANT
GLOVE SURG UNDER POLY LF SZ6.5 (GLOVE) ×1 IMPLANT
GLOVE SURG UNDER POLY LF SZ7 (GLOVE) ×1 IMPLANT
NDL HYPO 18GX1.5 BLUNT FILL (NEEDLE) IMPLANT
NEEDLE HYPO 18GX1.5 BLUNT FILL (NEEDLE) ×2 IMPLANT
PAD ARMBOARD 7.5X6 YLW CONV (MISCELLANEOUS) ×1 IMPLANT
RayOne EMV US Intraocular Lens (Intraocular Lens) ×1 IMPLANT
SYR TB 1ML LL NO SAFETY (SYRINGE) ×1 IMPLANT
TAPE SURG TRANSPORE 1 IN (GAUZE/BANDAGES/DRESSINGS) IMPLANT
TAPE SURGICAL TRANSPORE 1 IN (GAUZE/BANDAGES/DRESSINGS) ×2
WATER STERILE IRR 250ML POUR (IV SOLUTION) ×1 IMPLANT

## 2021-07-11 NOTE — Discharge Instructions (Signed)
Please discharge patient when stable, will follow up today with Dr. Masey Scheiber at the [facility] [place] office immediately following discharge.  Leave shield in place until visit.  All paperwork with discharge instructions will be given at the office.  [facility] Address:  [address]  [place], [place] [address]  

## 2021-07-11 NOTE — Transfer of Care (Signed)
Immediate Anesthesia Transfer of Care Note  Patient: [name]  Procedure(s) Performed: CATARACT EXTRACTION PHACO AND INTRAOCULAR LENS PLACEMENT LEFT EYE (Left: Eye)  Patient Location: Short Stay  Anesthesia Type:MAC  Level of Consciousness: awake, alert  and oriented  Airway & Oxygen Therapy: Patient Spontanous Breathing  Post-op Assessment: Report given to RN and Post -op Vital signs reviewed and stable  Post vital signs: Reviewed and stable  Last Vitals:  Vitals Value Taken Time  BP    Temp    Pulse    Resp    SpO2      Last Pain:  Vitals:   07/11/21 1125  PainSc: 0-No pain         Complications: No notable events documented.

## 2021-07-11 NOTE — Anesthesia Postprocedure Evaluation (Signed)
Anesthesia Post Note  Patient: [name]  Procedure(s) Performed: CATARACT EXTRACTION PHACO AND INTRAOCULAR LENS PLACEMENT LEFT EYE (Left: Eye)  Patient location during evaluation: Phase II Anesthesia Type: MAC Level of consciousness: awake and alert and oriented Pain management: pain level controlled Vital Signs Assessment: post-procedure vital signs reviewed and stable Respiratory status: spontaneous breathing and respiratory function stable Cardiovascular status: blood pressure returned to baseline and stable Postop Assessment: no apparent nausea or vomiting Anesthetic complications: no   No notable events documented.   Last Vitals:  Vitals:   07/11/21 1148 07/11/21 1245  BP:  (!) 161/78  Pulse: 70 72  Resp: 20 18  Temp:  36.7 C  SpO2: 97% 100%    Last Pain:  Vitals:   07/11/21 1245  TempSrc: Oral  PainSc: 0-No pain                 Jesika Men C Denicia Pagliarulo

## 2021-07-11 NOTE — Anesthesia Preprocedure Evaluation (Signed)
Anesthesia Evaluation  Patient identified by MRN, date of birth, ID band Patient awake    Reviewed: Allergy & Precautions, NPO status , Patient's Chart, lab work & pertinent test results, reviewed documented beta blocker date and time   History of Anesthesia Complications Negative for: history of anesthetic complications  Airway Mallampati: III  TM Distance: >3 FB Neck ROM: Full    Dental  (+) Dental Advisory Given   Pulmonary shortness of breath and with exertion,    Pulmonary exam normal breath sounds clear to auscultation       Cardiovascular Exercise Tolerance: Good (-) hypertensionPt. on home beta blockers + CAD and +CHF  + dysrhythmias Atrial Fibrillation + Valvular Problems/Murmurs MR  Rhythm:Irregular Rate:Normal  Please call the patient and tell him the pumping function looks good.  He has mild to moderate leaking mitral valve.  Mildly leaking aortic valve   Neuro/Psych negative neurological ROS  negative psych ROS   GI/Hepatic Neg liver ROS, GERD  Medicated and Controlled,Crohn's disease   Endo/Other  Hypothyroidism   Renal/GU negative Renal ROS     Musculoskeletal negative musculoskeletal ROS (+)   Abdominal   Peds  Hematology  (+) anemia ,   Anesthesia Other Findings 1. Left ventricular ejection fraction, by estimation, is approximately  55%. Left ventricular diastolic parameters are indeterminate in the setting of atrial fibrillation.  2. Right ventricular systolic function is normal. The right ventricular  size is normal. There is normal pulmonary artery systolic pressure. The  estimated right ventricular systolic pressure is 99.8 mmHg.  3. Left atrial size was moderately dilated.  4. The mitral valve is grossly normal. Mild to moderate mitral valve regurgitation.  5. The aortic valve is tricuspid. Aortic valve regurgitation is mild.  6. The inferior vena cava is normal in size with greater  than 50%  respiratory variability, suggesting right atrial pressure of 3 mmHg.   Reproductive/Obstetrics negative OB ROS                             Anesthesia Physical Anesthesia Plan  ASA: 3  Anesthesia Plan: MAC   Post-op Pain Management:    Induction:   PONV Risk Score and Plan:   Airway Management Planned: Nasal Cannula and Natural Airway  Additional Equipment:   Intra-op Plan:   Post-operative Plan:   Informed Consent: I have reviewed the patients History and Physical, chart, labs and discussed the procedure including the risks, benefits and alternatives for the proposed anesthesia with the patient or authorized representative who has indicated his/her understanding and acceptance.     Dental advisory given  Plan Discussed with: CRNA and Surgeon  Anesthesia Plan Comments:         Anesthesia Quick Evaluation

## 2021-07-11 NOTE — Interval H&P Note (Signed)
History and Physical Interval Note:  07/11/2021 12:21 PM  [name]  has presented today for surgery, with the diagnosis of nuclear cataract left eye.  The various methods of treatment have been discussed with the patient and family. After consideration of risks, benefits and other options for treatment, the patient has consented to  Procedure(s) with comments: CATARACT EXTRACTION PHACO AND INTRAOCULAR LENS PLACEMENT (IOC) (Left) - left, pt knows to arrive at 11:30 as a surgical intervention.  The patient's history has been reviewed, patient examined, no change in status, stable for surgery.  I have reviewed the patient's chart and labs.  Questions were answered to the patient's satisfaction.     Baruch Goldmann

## 2021-07-11 NOTE — Op Note (Signed)
Date of procedure: 07/11/21  Pre-operative diagnosis: Visually significant age-related combined cataract, Left Eye (H25.812)  Post-operative diagnosis: Visually significant age-related combined cataract, Left Eye (H25.812)  Procedure: Removal of cataract via phacoemulsification and insertion of intra-ocular lens Rayner [id number] +16.0D into the capsular bag of the Left Eye  Attending surgeon: Gerda Diss. Ceirra Belli, MD, MA  Anesthesia: MAC, Topical Akten  Complications: None  Estimated Blood Loss: <63m (minimal)  Specimens: None  Implants: As above  Indications:  Visually significant age-related cataract, Left Eye  Procedure:  The patient was seen and identified in the pre-operative area. The operative eye was identified and dilated.  The operative eye was marked.  Topical anesthesia was administered to the operative eye.     The patient was then to the operative suite and placed in the supine position.  A timeout was performed confirming the patient, procedure to be performed, and all other relevant information.   The patient's face was prepped and draped in the usual fashion for intra-ocular surgery.  A lid speculum was placed into the operative eye and the surgical microscope moved into place and focused.  An inferotemporal paracentesis was created using a 20 gauge paracentesis blade.  Shugarcaine was injected into the anterior chamber.  Viscoelastic was injected into the anterior chamber.  A temporal clear-corneal main wound incision was created using a 2.475mmicrokeratome.  A continuous curvilinear capsulorrhexis was initiated using an irrigating cystitome and completed using capsulorrhexis forceps.  Hydrodissection and hydrodeliniation were performed.  Viscoelastic was injected into the anterior chamber.  A phacoemulsification handpiece and a chopper as a second instrument were used to remove the nucleus and epinucleus. The irrigation/aspiration handpiece was used to remove any remaining  cortical material.   The capsular bag was reinflated with viscoelastic, checked, and found to be intact.  The intraocular lens was inserted into the capsular bag.  The irrigation/aspiration handpiece was used to remove any remaining viscoelastic.  The clear corneal wound and paracentesis wounds were then hydrated and checked with Weck-Cels to be watertight.  The lid-speculum was removed.  The drape was removed.  The patient's face was cleaned with a wet and dry 4x4.   Maxitrol was instilled in the eye. A clear shield was taped over the eye. The patient was taken to the post-operative care unit in good condition, having tolerated the procedure well.  Post-Op Instructions: [facility] and will receive all other orders and instructions.

## 2021-07-12 ENCOUNTER — Encounter (HOSPITAL_COMMUNITY): Payer: Self-pay | Admitting: Ophthalmology

## 2021-07-19 ENCOUNTER — Ambulatory Visit (INDEPENDENT_AMBULATORY_CARE_PROVIDER_SITE_OTHER): Payer: PPO | Admitting: *Deleted

## 2021-07-19 DIAGNOSIS — M9903 Segmental and somatic dysfunction of lumbar region: Secondary | ICD-10-CM | POA: Diagnosis not present

## 2021-07-19 DIAGNOSIS — Z5181 Encounter for therapeutic drug level monitoring: Secondary | ICD-10-CM

## 2021-07-19 DIAGNOSIS — M47816 Spondylosis without myelopathy or radiculopathy, lumbar region: Secondary | ICD-10-CM | POA: Diagnosis not present

## 2021-07-19 DIAGNOSIS — I4891 Unspecified atrial fibrillation: Secondary | ICD-10-CM

## 2021-07-19 LAB — POCT INR: INR: 2 (ref 2.0–3.0)

## 2021-07-19 NOTE — Patient Instructions (Signed)
Continue warfarin 1 tablet daily except 1 1/2 tablets on Sundays.   Recheck in 4 weeks

## 2021-07-20 ENCOUNTER — Telehealth (INDEPENDENT_AMBULATORY_CARE_PROVIDER_SITE_OTHER): Payer: Self-pay

## 2021-07-20 NOTE — Telephone Encounter (Signed)
Patient called today regarding his patient assistance forms for Humira through [employer] [place] stating he needed [place] to help him fill out his forms to recertify. Patient covered through 10/15/2021. I have called [employer] [place] [place] and spoke with Rosanna whom states the patient can recertify over the phone, Doctors office does not need to do any thing at this moment. Rosanna states [employer] assist will contact the Doctors office when a new application is needed from [place]. They will send [place] our portion and send the patient his portion at that time.Once the patient calls them at [phone] opt #1 to recertify he will be updated until 10/15/2022. They will send in Refill request to the Doctors office when he is due for refills.

## 2021-07-21 DIAGNOSIS — M9903 Segmental and somatic dysfunction of lumbar region: Secondary | ICD-10-CM | POA: Diagnosis not present

## 2021-07-21 DIAGNOSIS — M47816 Spondylosis without myelopathy or radiculopathy, lumbar region: Secondary | ICD-10-CM | POA: Diagnosis not present

## 2021-07-26 ENCOUNTER — Ambulatory Visit: Payer: PPO | Admitting: Cardiology

## 2021-07-26 ENCOUNTER — Encounter: Payer: Self-pay | Admitting: Cardiology

## 2021-07-26 VITALS — BP 122/70 | HR 51 | Ht 68.0 in | Wt 156.6 lb

## 2021-07-26 DIAGNOSIS — I4821 Permanent atrial fibrillation: Secondary | ICD-10-CM | POA: Diagnosis not present

## 2021-07-26 DIAGNOSIS — Z23 Encounter for immunization: Secondary | ICD-10-CM | POA: Diagnosis not present

## 2021-07-26 DIAGNOSIS — I2510 Atherosclerotic heart disease of native coronary artery without angina pectoris: Secondary | ICD-10-CM | POA: Diagnosis not present

## 2021-07-26 DIAGNOSIS — M9903 Segmental and somatic dysfunction of lumbar region: Secondary | ICD-10-CM | POA: Diagnosis not present

## 2021-07-26 DIAGNOSIS — M47816 Spondylosis without myelopathy or radiculopathy, lumbar region: Secondary | ICD-10-CM | POA: Diagnosis not present

## 2021-07-26 NOTE — Patient Instructions (Addendum)

## 2021-07-26 NOTE — Progress Notes (Signed)
Cardiology Office Note  Date: 07/26/2021   ID: DALAN COWGER, DOB [birth year], MRN [id number]  PCP:  Manon Hilding, MD  Cardiologist:  Rozann Lesches, MD Electrophysiologist:  None   Chief Complaint  Patient presents with   Cardiac follow-up    History of Present Illness: [name] is an 87 y.o. male last seen in March.  He is here with his wife for a follow-up visit.  He does not report any sense of palpitations or chest discomfort with activity.  He has slowed down, not walking as far as he used to, has had some trouble with arthritic back pain that has been limiting.  He is seeing a chiropractor right now.  Still trying to do some activity each day.  He remains on Coumadin with follow-up in the anticoagulation clinic.  Recent INR 2.0.  He does not report any spontaneous bleeding problems.  I reviewed the remainder of his medications as outlined below.  Past Medical History:  Diagnosis Date   Anemia    Atrial fibrillation (HCC)    BPH (benign prostatic hyperplasia)    CAD (coronary artery disease)    Catheterization 2004, mild/moderate nonobstructive disease  /   nuclear, 2007, small inferior scar // no ischemia   Crohn's disease (HCC)    Elevated PSA    GERD (gastroesophageal reflux disease)    History of kidney stones    History of pneumonia    Hypothyroidism    Mitral regurgitation    SBO (small bowel obstruction) ([place])    Urinary retention    Wears glasses     Past Surgical History:  Procedure Laterality Date   AGILE CAPSULE  10/18/2011   Procedure: AGILE CAPSULE;  Surgeon: Rogene Houston, MD;  Location: [facility];  Service: Endoscopy;  Laterality: N/A;  [place]  2004   CATARACT EXTRACTION W/PHACO Right 06/27/2021   Procedure: CATARACT EXTRACTION PHACO AND INTRAOCULAR LENS PLACEMENT (IOC);  Surgeon: Baruch Goldmann, MD;  Location: [facility] ORS;  Service: Ophthalmology;  Laterality: Right;  CDE 21.98   CATARACT EXTRACTION W/PHACO Left  07/11/2021   Procedure: CATARACT EXTRACTION PHACO AND INTRAOCULAR LENS PLACEMENT LEFT EYE;  Surgeon: Baruch Goldmann, MD;  Location: [facility];  Service: Ophthalmology;  Laterality: Left;  CDE=10.42   CHOLECYSTECTOMY  2010   Dr. Anthony Sar   COLONOSCOPY  2008   [place]   COLONOSCOPY N/A 03/23/2016   Procedure: COLONOSCOPY;  Surgeon: Rogene Houston, MD;  [facility]: [facility];  Service: Endoscopy;  Laterality: N/A;  1:00   CYSTOSCOPY WITH INSERTION OF UROLIFT     ESOPHAGOGASTRODUODENOSCOPY  11/08/2011   Procedure: ESOPHAGOGASTRODUODENOSCOPY (EGD);  Surgeon: Rogene Houston, MD;  [facility]: [facility];  Service: Endoscopy;  Laterality: N/A;  300   POLYPECTOMY  03/23/2016   Procedure: POLYPECTOMY;  Surgeon: Rogene Houston, MD;  [facility]: [facility];  Service: Endoscopy;;  Cecal polyp removed via cold forceps recto-sigmoid polyp removed via cold snare   TRANSURETHRAL RESECTION OF PROSTATE N/A 04/29/2020   Procedure: TRANSURETHRAL RESECTION OF THE PROSTATE (TURP);  Surgeon: Franchot Gallo, MD;  Location: [facility];  Service: Urology;  Laterality: N/A;  90 MINS    Current Outpatient Medications  Medication Sig Dispense Refill   acetaminophen (TYLENOL) 500 MG tablet Take 500 mg by mouth every 6 (six) hours as needed for mild pain or moderate pain.     Adalimumab (HUMIRA) 40 MG/0.4ML PSKT Inject 40 mg into the skin  every 14 (fourteen) days.     carvedilol (COREG) 6.25 MG tablet TAKE ONE TABLET BY MOUTH TWICE DAILY 60 tablet 6   Charcoal 200 MG CAPS Take 200 mg by mouth at bedtime.     diltiazem (CARDIZEM CD) 180 MG 24 hr capsule Take 180 mg by mouth 2 (two) times daily.     famotidine (PEPCID) 40 MG tablet TAKE ONE TABLET BY MOUTH AT BEDTIME (Patient taking differently: Take 20 mg by mouth at bedtime.) 90 tablet 3   levothyroxine (SYNTHROID, LEVOTHROID) 75 MCG tablet Take 75 mcg by mouth daily before breakfast.     magnesium gluconate (MAGONATE) 500 MG tablet Take 500 mg by mouth 2  (two) times daily.     Melatonin 10 MG TABS Take 12 mg by mouth at bedtime.     nitroGLYCERIN (NITROSTAT) 0.4 MG SL tablet Place 0.4 mg under the tongue every 5 (five) minutes as needed. For chest pains. May repeat for up to 3 doses.     simvastatin (ZOCOR) 10 MG tablet Take 10 mg by mouth at bedtime.     warfarin (COUMADIN) 2.5 MG tablet Take 2.5 mg by mouth as directed. Take 2.5 mg every day EXCEPT: Sunday take  3.75 mg     No current facility-administered medications for this visit.   Allergies:  Patient has no known allergies.   ROS: Ankle edema.  He uses compression socks.  Physical Exam: VS:  BP 122/70   Pulse (!) 51   Ht 5' 8"  (1.727 m)   Wt 156 lb 9.6 oz (71 kg)   SpO2 99%   BMI 23.81 kg/m , BMI Body mass index is 23.81 kg/m.  Wt Readings from Last 3 Encounters:  07/26/21 156 lb 9.6 oz (71 kg)  07/07/21 155 lb (70.3 kg)  06/27/21 155 lb (70.3 kg)    General: Patient appears comfortable at rest. HEENT: Conjunctiva and lids normal, wearing a mask. Neck: Supple, no elevated JVP or carotid bruits, no thyromegaly. Lungs: Clear to auscultation, nonlabored breathing at rest. Cardiac: Irregularly irregular, no S3, 1/6 systolic murmur, no pericardial rub. Extremities: No pitting edema.  ECG:  An ECG dated 01/15/2021 was personally reviewed today and demonstrated:  Atrial fibrillation with PVC.  Recent Labwork: 11/19/2020: ALT 34; AST 24; BUN 15; Creatinine, Ser 1.35; Hemoglobin 14.5; Magnesium 1.5; Platelets 199; Potassium 3.5; Sodium 136   Other Studies Reviewed Today:  Lexiscan Myoview 08/14/2017: There was no ST segment deviation noted during stress. Findings consistent with prior inferoseptal myocardial infarction without current ischemia. This is a low risk study. The left ventricular ejection fraction is normal (55-65%).  Echocardiogram 05/19/2020:  1. Left ventricular ejection fraction, by estimation, is approximately  55%. Left ventricular diastolic parameters are  indeterminate in the  setting of atrial fibrillation.   2. Right ventricular systolic function is normal. The right ventricular  size is normal. There is normal pulmonary artery systolic pressure. The  estimated right ventricular systolic pressure is 17.6 mmHg.   3. Left atrial size was moderately dilated.   4. The mitral valve is grossly normal. Mild to moderate mitral valve  regurgitation.   5. The aortic valve is tricuspid. Aortic valve regurgitation is mild.   6. The inferior vena cava is normal in size with greater than 50%  respiratory variability, suggesting right atrial pressure of 3 mmHg.   Assessment and Plan:  1.  Permanent atrial fibrillation with CHA2DS2-VASc score of 3.  He is asymptomatic and has good heart rate control on  Coreg and Cardizem CD.  Continue Coumadin with follow-up in anticoagulation clinic.  No bleeding problems reported, recent INR therapeutic.  2.  History of nonobstructive CAD based on previous testing.  We are following conservatively at this time in the absence of angina symptoms.  Myoview in 2018 was low risk.  He is on Zocor and has as needed nitroglycerin available.  Medication Adjustments/Labs and Tests Ordered: Current medicines are reviewed at length with the patient today.  Concerns regarding medicines are outlined above.   Tests Ordered: Orders Placed This Encounter  Procedures   Flu Vaccine QUAD High Dose(Fluad)    Medication Changes: No orders of the defined types were placed in this encounter.   Disposition:  Follow up  6 months.  Signed, Satira Sark, MD, [facility] 07/26/2021 12:16 PM    [facility] [place], [place], [place] [address] Phone: [phone]; Fax: [phone]

## 2021-07-28 DIAGNOSIS — M47816 Spondylosis without myelopathy or radiculopathy, lumbar region: Secondary | ICD-10-CM | POA: Diagnosis not present

## 2021-07-28 DIAGNOSIS — M9903 Segmental and somatic dysfunction of lumbar region: Secondary | ICD-10-CM | POA: Diagnosis not present

## 2021-08-15 DIAGNOSIS — I2510 Atherosclerotic heart disease of native coronary artery without angina pectoris: Secondary | ICD-10-CM | POA: Diagnosis not present

## 2021-08-15 DIAGNOSIS — I482 Chronic atrial fibrillation, unspecified: Secondary | ICD-10-CM | POA: Diagnosis not present

## 2021-08-15 DIAGNOSIS — E7849 Other hyperlipidemia: Secondary | ICD-10-CM | POA: Diagnosis not present

## 2021-08-15 DIAGNOSIS — N183 Chronic kidney disease, stage 3 unspecified: Secondary | ICD-10-CM | POA: Diagnosis not present

## 2021-08-15 DIAGNOSIS — I129 Hypertensive chronic kidney disease with stage 1 through stage 4 chronic kidney disease, or unspecified chronic kidney disease: Secondary | ICD-10-CM | POA: Diagnosis not present

## 2021-08-16 DIAGNOSIS — L570 Actinic keratosis: Secondary | ICD-10-CM | POA: Diagnosis not present

## 2021-08-16 DIAGNOSIS — D485 Neoplasm of uncertain behavior of skin: Secondary | ICD-10-CM | POA: Diagnosis not present

## 2021-08-16 DIAGNOSIS — B360 Pityriasis versicolor: Secondary | ICD-10-CM | POA: Diagnosis not present

## 2021-08-17 ENCOUNTER — Ambulatory Visit (INDEPENDENT_AMBULATORY_CARE_PROVIDER_SITE_OTHER): Payer: PPO | Admitting: *Deleted

## 2021-08-17 DIAGNOSIS — Z5181 Encounter for therapeutic drug level monitoring: Secondary | ICD-10-CM | POA: Diagnosis not present

## 2021-08-17 DIAGNOSIS — I4891 Unspecified atrial fibrillation: Secondary | ICD-10-CM

## 2021-08-17 LAB — POCT INR: INR: 2.1 (ref 2.0–3.0)

## 2021-08-17 NOTE — Patient Instructions (Signed)
Continue warfarin 1 tablet daily except 1 1/2 tablets on Sundays.   Recheck in 4 weeks

## 2021-08-25 ENCOUNTER — Telehealth: Payer: Self-pay | Admitting: Cardiology

## 2021-08-25 NOTE — Telephone Encounter (Signed)
Pt c/o medication issue:  1. Name of Medication: fluconazole  2. How are you currently taking this medication (dosage and times per day)?   3. Are you having a reaction (difficulty breathing--STAT)? no  4. What is your medication issue? Patient want to make sure that this medication is okay to take. Dr. Tarri Glenn is the one that prescribe the medication to the patient.

## 2021-08-25 NOTE — Telephone Encounter (Signed)
Called pt.  He was started on fluconazole 167m every Monday x 4 wks.  Took first dose 08/22/21.  Informed this could elevate INR.  Pt to eat extra greens after taking and come for INR check on 09/01/21.  He verbalized understanding.

## 2021-08-30 ENCOUNTER — Ambulatory Visit (INDEPENDENT_AMBULATORY_CARE_PROVIDER_SITE_OTHER): Payer: PPO | Admitting: Internal Medicine

## 2021-08-30 ENCOUNTER — Other Ambulatory Visit: Payer: Self-pay

## 2021-08-30 ENCOUNTER — Encounter (INDEPENDENT_AMBULATORY_CARE_PROVIDER_SITE_OTHER): Payer: Self-pay | Admitting: Internal Medicine

## 2021-08-30 VITALS — BP 115/72 | HR 90 | Temp 98.1°F | Ht 68.0 in | Wt 153.8 lb

## 2021-08-30 DIAGNOSIS — K5000 Crohn's disease of small intestine without complications: Secondary | ICD-10-CM | POA: Diagnosis not present

## 2021-08-30 DIAGNOSIS — R198 Other specified symptoms and signs involving the digestive system and abdomen: Secondary | ICD-10-CM

## 2021-08-30 NOTE — Patient Instructions (Signed)
Physician will call with results of stool test when completed.  Please also asked Dr. Quintin Alto to do check vitamin D level with your next blood work.

## 2021-08-30 NOTE — Progress Notes (Signed)
Presenting complaint;  Follow-up for small bowel Crohn's disease. History of difficult defecation.  Database and subjective:  Patient is 87 year old Caucasian male with history of small bowel Crohn's disease who is here for scheduled visit.  He was last seen 4 months ago. He is status post small bowel resection back in October 2020.  He was initially treated with oral mesalamine and budesonide.  His symptoms seem to relapse of budesonide.  He was started on Humira/adalimumab in May this year.  He has experienced no side effects whatsoever.  He feels he is doing well.  He is having much less gas pains.  He is still using activated charcoal at bedtime.  His appetite is very good.  He denies nausea vomiting or heartburn.  He generally has 1 bowel movement every day.  At times he has difficulty to pass stool from his rectum and is using suppository. He had a precancerous lesion removed from his forehead earlier this month. He remains with back pain.  He has had 3 shots but it has not provided much relief.  He knows that he cannot take NSAIDs.  Current Medications: Outpatient Encounter Medications as of 08/30/2021  Medication Sig   acetaminophen (TYLENOL) 500 MG tablet Take 500 mg by mouth every 6 (six) hours as needed for mild pain or moderate pain.   Adalimumab (HUMIRA) 40 MG/0.4ML PSKT Inject 40 mg into the skin every 14 (fourteen) days.   carvedilol (COREG) 6.25 MG tablet TAKE ONE TABLET BY MOUTH TWICE DAILY   Charcoal 200 MG CAPS Take 200 mg by mouth at bedtime.   diltiazem (CARDIZEM CD) 180 MG 24 hr capsule Take 180 mg by mouth 2 (two) times daily.   famotidine (PEPCID) 20 MG tablet Take 20 mg by mouth at bedtime.   fluconazole (DIFLUCAN) 150 MG tablet Take 150 mg by mouth. Take one tablet by mouth once a week for 4 weeks. ( Will finish on 09/12/21)   levothyroxine (SYNTHROID, LEVOTHROID) 75 MCG tablet Take 75 mcg by mouth daily before breakfast.   magnesium gluconate (MAGONATE) 500 MG tablet  Take 500 mg by mouth 2 (two) times daily.   Melatonin 12 MG TABS Take 12 mg by mouth at bedtime.   nitroGLYCERIN (NITROSTAT) 0.4 MG SL tablet Place 0.4 mg under the tongue every 5 (five) minutes as needed. For chest pains. May repeat for up to 3 doses.   simvastatin (ZOCOR) 10 MG tablet Take 10 mg by mouth at bedtime.   warfarin (COUMADIN) 2.5 MG tablet Take 2.5 mg by mouth as directed. Take 2.5 mg every day EXCEPT: Sunday take  3.75 mg   [DISCONTINUED] famotidine (PEPCID) 40 MG tablet TAKE ONE TABLET BY MOUTH AT BEDTIME (Patient taking differently: Take 20 mg by mouth at bedtime.)   No facility-administered encounter medications on file as of 08/30/2021.     Objective: Blood pressure 115/72, pulse 90, temperature 98.1 F (36.7 C), temperature source Oral, height _0  (1.727 m), weight 153 lb 12.8 oz (69.8 kg). Patient is alert and in no acute distress. He has Mende over his forehead site of recent lesion excision Conjunctiva is pink. Sclera is nonicteric Oropharyngeal mucosa is normal. No neck masses or thyromegaly noted. Cardiac exam with regular rhythm normal S1 and S2. No murmur or gallop noted. Lungs are clear to auscultation. Abdomen is not distended.  He has a small upper midline right paramedian scars.  Bowel sounds are normal.  On palpation abdomen is soft and nontender with organomegaly or masses. No  LE edema or clubbing noted.  Labs/studies Results:   CBC Latest Ref Rng & Units 11/19/2020 04/29/2020 09/25/2019  WBC 4.0 - 10.5 K/uL 9.4 - 12.3(H)  Hemoglobin 13.0 - 17.0 g/dL 14.5 13.3 12.2(L)  Hematocrit 39.0 - 52.0 % 43.7 39.0 37.1(L)  Platelets 150 - 400 K/uL 199 - 258    CMP Latest Ref Rng & Units 11/19/2020 04/29/2020 09/25/2019  Glucose 70 - 99 mg/dL 132(H) 95 97  BUN 8 - 23 mg/dL _0 Creatinine 0.61 - 1.24 mg/dL 1.35(H) 1.70(H) 0.95  Sodium 135 - 145 mmol/L 136 141 137  Potassium 3.5 - 5.1 mmol/L 3.5 4.2 4.3  Chloride 98 - 111 mmol/L 100 105 102  CO2 22 - 32  mmol/L 26 - 27  Calcium 8.9 - 10.3 mg/dL 9.5 - 8.4(L)  Total Protein 6.5 - 8.1 g/dL 6.5 - 5.7(L)  Total Bilirubin 0.3 - 1.2 mg/dL 1.1 - 0.4  Alkaline Phos 38 - 126 U/L 67 - -  AST 15 - 41 U/L 24 - 16  ALT 0 - 44 U/L 34 - 21    Hepatic Function Latest Ref Rng & Units 11/19/2020 09/25/2019 06/15/2017  Total Protein 6.5 - 8.1 g/dL 6.5 5.7(L) 6.2  Albumin 3.5 - 5.0 g/dL 3.6 - 3.9  AST 15 - 41 U/L _1 ALT 0 - 44 U/L 34 21 10  Alk Phosphatase 38 - 126 U/L 67 - 65  Total Bilirubin 0.3 - 1.2 mg/dL 1.1 0.4 0.6  Bilirubin, Direct <=0.2 mg/dL - - 0.1    Lab Results  Component Value Date   CRP 6.3 09/25/2019      Assessment:  #1.  Small bowel Crohn's disease.  He presented with small bowel obstruction and required resection of the strictured small bowel in October 2020.  He then developed C. difficile colitis resulting in very slow recovery.  He is doing well on Humira/adalimumab.  He will stay on this therapy as long as it is working and he has no side effects.  #2.  Defecation disorder.  His defecation has improved.  He is requiring suppository less often.  Plan:  Fecal calprotectin. Patient will have vitamin D level checked when he has complete blood count by Dr. Quintin Alto later this month. Office visit in 6 months.

## 2021-08-31 DIAGNOSIS — K5000 Crohn's disease of small intestine without complications: Secondary | ICD-10-CM | POA: Diagnosis not present

## 2021-08-31 DIAGNOSIS — R198 Other specified symptoms and signs involving the digestive system and abdomen: Secondary | ICD-10-CM | POA: Diagnosis not present

## 2021-09-01 ENCOUNTER — Ambulatory Visit (INDEPENDENT_AMBULATORY_CARE_PROVIDER_SITE_OTHER): Payer: PPO | Admitting: *Deleted

## 2021-09-01 DIAGNOSIS — I4891 Unspecified atrial fibrillation: Secondary | ICD-10-CM | POA: Diagnosis not present

## 2021-09-01 DIAGNOSIS — Z5181 Encounter for therapeutic drug level monitoring: Secondary | ICD-10-CM | POA: Diagnosis not present

## 2021-09-01 LAB — POCT INR: INR: 3.1 — AB (ref 2.0–3.0)

## 2021-09-01 NOTE — Patient Instructions (Signed)
Hold warfarin tomorrow then resume 1 tablet daily except 1 1/2 tablets on Sundays.   Taking Diflucan 1 x wk x 4 wks.  Has 2 doses left.  Recheck in 2 weeks

## 2021-09-07 LAB — CALPROTECTIN: Calprotectin: 17 mcg/g

## 2021-09-10 DIAGNOSIS — R519 Headache, unspecified: Secondary | ICD-10-CM | POA: Diagnosis not present

## 2021-09-10 DIAGNOSIS — Z0389 Encounter for observation for other suspected diseases and conditions ruled out: Secondary | ICD-10-CM | POA: Diagnosis not present

## 2021-09-10 DIAGNOSIS — Z0189 Encounter for other specified special examinations: Secondary | ICD-10-CM | POA: Diagnosis not present

## 2021-09-10 DIAGNOSIS — R2681 Unsteadiness on feet: Secondary | ICD-10-CM | POA: Diagnosis not present

## 2021-09-10 DIAGNOSIS — I252 Old myocardial infarction: Secondary | ICD-10-CM | POA: Diagnosis not present

## 2021-09-10 DIAGNOSIS — Z9049 Acquired absence of other specified parts of digestive tract: Secondary | ICD-10-CM | POA: Diagnosis not present

## 2021-09-13 DIAGNOSIS — N183 Chronic kidney disease, stage 3 unspecified: Secondary | ICD-10-CM | POA: Diagnosis not present

## 2021-09-13 DIAGNOSIS — R5383 Other fatigue: Secondary | ICD-10-CM | POA: Diagnosis not present

## 2021-09-13 DIAGNOSIS — I482 Chronic atrial fibrillation, unspecified: Secondary | ICD-10-CM | POA: Diagnosis not present

## 2021-09-13 DIAGNOSIS — E860 Dehydration: Secondary | ICD-10-CM | POA: Diagnosis not present

## 2021-09-13 DIAGNOSIS — Z6823 Body mass index (BMI) 23.0-23.9, adult: Secondary | ICD-10-CM | POA: Diagnosis not present

## 2021-09-13 DIAGNOSIS — R519 Headache, unspecified: Secondary | ICD-10-CM | POA: Diagnosis not present

## 2021-09-13 DIAGNOSIS — R319 Hematuria, unspecified: Secondary | ICD-10-CM | POA: Diagnosis not present

## 2021-09-14 ENCOUNTER — Ambulatory Visit (INDEPENDENT_AMBULATORY_CARE_PROVIDER_SITE_OTHER): Payer: PPO | Admitting: *Deleted

## 2021-09-14 DIAGNOSIS — I4891 Unspecified atrial fibrillation: Secondary | ICD-10-CM

## 2021-09-14 DIAGNOSIS — Z5181 Encounter for therapeutic drug level monitoring: Secondary | ICD-10-CM

## 2021-09-14 LAB — POCT INR: INR: 2 (ref 2.0–3.0)

## 2021-09-14 NOTE — Patient Instructions (Signed)
Continue warfarin 1 tablet daily except 1 1/2 tablets on Sundays.   Recheck in 3 weeks

## 2021-09-19 ENCOUNTER — Telehealth (INDEPENDENT_AMBULATORY_CARE_PROVIDER_SITE_OTHER): Payer: Self-pay

## 2021-09-19 ENCOUNTER — Encounter (INDEPENDENT_AMBULATORY_CARE_PROVIDER_SITE_OTHER): Payer: Self-pay

## 2021-09-19 NOTE — Telephone Encounter (Signed)
Patient made aware he may try Simethicone.

## 2021-09-19 NOTE — Telephone Encounter (Signed)
I do not prescribe charcoal for gas. He can try Gas-x or generic simethicone as needed for gas.

## 2021-09-19 NOTE — Telephone Encounter (Signed)
Changed on Medication list.

## 2021-09-19 NOTE — Telephone Encounter (Signed)
Patient called today stating he takes Charcoal and he is due to pick up more from otc, he states he has a hard time finding the mg he needs 100-200 mg capsules. He takes it for gas.He wants to know if he really needs to be on this. If not he will not worry about trying to find it over the counter. Please advise.

## 2021-09-21 DIAGNOSIS — L986 Other infiltrative disorders of the skin and subcutaneous tissue: Secondary | ICD-10-CM | POA: Diagnosis not present

## 2021-09-21 DIAGNOSIS — D485 Neoplasm of uncertain behavior of skin: Secondary | ICD-10-CM | POA: Diagnosis not present

## 2021-09-21 DIAGNOSIS — L309 Dermatitis, unspecified: Secondary | ICD-10-CM | POA: Diagnosis not present

## 2021-09-29 DIAGNOSIS — R5383 Other fatigue: Secondary | ICD-10-CM | POA: Diagnosis not present

## 2021-09-29 DIAGNOSIS — E7801 Familial hypercholesterolemia: Secondary | ICD-10-CM | POA: Diagnosis not present

## 2021-09-29 DIAGNOSIS — I10 Essential (primary) hypertension: Secondary | ICD-10-CM | POA: Diagnosis not present

## 2021-09-29 DIAGNOSIS — E039 Hypothyroidism, unspecified: Secondary | ICD-10-CM | POA: Diagnosis not present

## 2021-09-29 DIAGNOSIS — E7800 Pure hypercholesterolemia, unspecified: Secondary | ICD-10-CM | POA: Diagnosis not present

## 2021-09-29 DIAGNOSIS — N183 Chronic kidney disease, stage 3 unspecified: Secondary | ICD-10-CM | POA: Diagnosis not present

## 2021-09-29 DIAGNOSIS — K2100 Gastro-esophageal reflux disease with esophagitis, without bleeding: Secondary | ICD-10-CM | POA: Diagnosis not present

## 2021-10-04 ENCOUNTER — Other Ambulatory Visit: Payer: Self-pay | Admitting: Cardiology

## 2021-10-04 DIAGNOSIS — E7801 Familial hypercholesterolemia: Secondary | ICD-10-CM | POA: Diagnosis not present

## 2021-10-04 DIAGNOSIS — N1330 Unspecified hydronephrosis: Secondary | ICD-10-CM | POA: Diagnosis not present

## 2021-10-04 DIAGNOSIS — I10 Essential (primary) hypertension: Secondary | ICD-10-CM | POA: Diagnosis not present

## 2021-10-04 DIAGNOSIS — I482 Chronic atrial fibrillation: Secondary | ICD-10-CM | POA: Diagnosis not present

## 2021-10-04 DIAGNOSIS — D649 Anemia, unspecified: Secondary | ICD-10-CM | POA: Diagnosis not present

## 2021-10-04 DIAGNOSIS — G2581 Restless legs syndrome: Secondary | ICD-10-CM | POA: Diagnosis not present

## 2021-10-04 DIAGNOSIS — N189 Chronic kidney disease, unspecified: Secondary | ICD-10-CM | POA: Diagnosis not present

## 2021-10-04 DIAGNOSIS — N183 Chronic kidney disease, stage 3 (moderate): Secondary | ICD-10-CM | POA: Diagnosis not present

## 2021-10-04 DIAGNOSIS — R42 Dizziness and giddiness: Secondary | ICD-10-CM | POA: Diagnosis not present

## 2021-10-05 ENCOUNTER — Ambulatory Visit (INDEPENDENT_AMBULATORY_CARE_PROVIDER_SITE_OTHER): Payer: PPO | Admitting: *Deleted

## 2021-10-05 DIAGNOSIS — I4891 Unspecified atrial fibrillation: Secondary | ICD-10-CM

## 2021-10-05 DIAGNOSIS — Z5181 Encounter for therapeutic drug level monitoring: Secondary | ICD-10-CM | POA: Diagnosis not present

## 2021-10-05 LAB — POCT INR: INR: 1.7 — AB (ref 2.0–3.0)

## 2021-10-05 NOTE — Patient Instructions (Signed)
Take warfarin 2 tablets tonight then increase dose to 1 tablet daily except 1 1/2 tablets on Sundays and Wednesdays Recheck in 2 weeks

## 2021-10-13 DIAGNOSIS — I509 Heart failure, unspecified: Secondary | ICD-10-CM | POA: Diagnosis not present

## 2021-10-13 DIAGNOSIS — D6869 Other thrombophilia: Secondary | ICD-10-CM | POA: Diagnosis not present

## 2021-10-13 DIAGNOSIS — D692 Other nonthrombocytopenic purpura: Secondary | ICD-10-CM | POA: Diagnosis not present

## 2021-10-13 DIAGNOSIS — E785 Hyperlipidemia, unspecified: Secondary | ICD-10-CM | POA: Diagnosis not present

## 2021-10-13 DIAGNOSIS — N183 Chronic kidney disease, stage 3 unspecified: Secondary | ICD-10-CM | POA: Diagnosis not present

## 2021-10-13 DIAGNOSIS — Z79890 Hormone replacement therapy (postmenopausal): Secondary | ICD-10-CM | POA: Diagnosis not present

## 2021-10-13 DIAGNOSIS — Z6823 Body mass index (BMI) 23.0-23.9, adult: Secondary | ICD-10-CM | POA: Diagnosis not present

## 2021-10-13 DIAGNOSIS — I482 Chronic atrial fibrillation, unspecified: Secondary | ICD-10-CM | POA: Diagnosis not present

## 2021-10-13 DIAGNOSIS — E079 Disorder of thyroid, unspecified: Secondary | ICD-10-CM | POA: Diagnosis not present

## 2021-10-13 DIAGNOSIS — I25118 Atherosclerotic heart disease of native coronary artery with other forms of angina pectoris: Secondary | ICD-10-CM | POA: Diagnosis not present

## 2021-10-13 DIAGNOSIS — Z7901 Long term (current) use of anticoagulants: Secondary | ICD-10-CM | POA: Diagnosis not present

## 2021-10-13 DIAGNOSIS — E039 Hypothyroidism, unspecified: Secondary | ICD-10-CM | POA: Diagnosis not present

## 2021-10-14 DIAGNOSIS — N183 Chronic kidney disease, stage 3 unspecified: Secondary | ICD-10-CM | POA: Diagnosis not present

## 2021-10-14 DIAGNOSIS — I2510 Atherosclerotic heart disease of native coronary artery without angina pectoris: Secondary | ICD-10-CM | POA: Diagnosis not present

## 2021-10-14 DIAGNOSIS — I482 Chronic atrial fibrillation, unspecified: Secondary | ICD-10-CM | POA: Diagnosis not present

## 2021-10-14 DIAGNOSIS — E7849 Other hyperlipidemia: Secondary | ICD-10-CM | POA: Diagnosis not present

## 2021-10-14 DIAGNOSIS — I129 Hypertensive chronic kidney disease with stage 1 through stage 4 chronic kidney disease, or unspecified chronic kidney disease: Secondary | ICD-10-CM | POA: Diagnosis not present

## 2021-10-19 ENCOUNTER — Ambulatory Visit (INDEPENDENT_AMBULATORY_CARE_PROVIDER_SITE_OTHER): Payer: PPO | Admitting: *Deleted

## 2021-10-19 DIAGNOSIS — Z5181 Encounter for therapeutic drug level monitoring: Secondary | ICD-10-CM | POA: Diagnosis not present

## 2021-10-19 DIAGNOSIS — I4891 Unspecified atrial fibrillation: Secondary | ICD-10-CM | POA: Diagnosis not present

## 2021-10-19 LAB — POCT INR: INR: 1.9 — AB (ref 2.0–3.0)

## 2021-10-19 NOTE — Patient Instructions (Signed)
Increase warfarin to 1 tablet daily except 1 1/2 tablets on Mondays, Wednesdays and Fridays Recheck in 3 weeks

## 2021-11-01 DIAGNOSIS — L821 Other seborrheic keratosis: Secondary | ICD-10-CM | POA: Diagnosis not present

## 2021-11-01 DIAGNOSIS — Z6823 Body mass index (BMI) 23.0-23.9, adult: Secondary | ICD-10-CM | POA: Diagnosis not present

## 2021-11-07 DIAGNOSIS — M79674 Pain in right toe(s): Secondary | ICD-10-CM | POA: Diagnosis not present

## 2021-11-07 DIAGNOSIS — S92325A Nondisplaced fracture of second metatarsal bone, left foot, initial encounter for closed fracture: Secondary | ICD-10-CM | POA: Diagnosis not present

## 2021-11-07 DIAGNOSIS — M79671 Pain in right foot: Secondary | ICD-10-CM | POA: Diagnosis not present

## 2021-11-10 ENCOUNTER — Ambulatory Visit (INDEPENDENT_AMBULATORY_CARE_PROVIDER_SITE_OTHER): Payer: PPO | Admitting: *Deleted

## 2021-11-10 DIAGNOSIS — Z5181 Encounter for therapeutic drug level monitoring: Secondary | ICD-10-CM | POA: Diagnosis not present

## 2021-11-10 DIAGNOSIS — I4891 Unspecified atrial fibrillation: Secondary | ICD-10-CM

## 2021-11-10 LAB — POCT INR: INR: 2.4 (ref 2.0–3.0)

## 2021-11-10 NOTE — Patient Instructions (Signed)
Continue warfarin 1 tablet daily except 1 1/2 tablets on Mondays, Wednesdays and Fridays Recheck in 4 weeks

## 2021-11-13 DIAGNOSIS — I129 Hypertensive chronic kidney disease with stage 1 through stage 4 chronic kidney disease, or unspecified chronic kidney disease: Secondary | ICD-10-CM | POA: Diagnosis not present

## 2021-11-13 DIAGNOSIS — N183 Chronic kidney disease, stage 3 unspecified: Secondary | ICD-10-CM | POA: Diagnosis not present

## 2021-11-13 DIAGNOSIS — E7849 Other hyperlipidemia: Secondary | ICD-10-CM | POA: Diagnosis not present

## 2021-11-30 DIAGNOSIS — R0789 Other chest pain: Secondary | ICD-10-CM | POA: Diagnosis not present

## 2021-11-30 DIAGNOSIS — Z6823 Body mass index (BMI) 23.0-23.9, adult: Secondary | ICD-10-CM | POA: Diagnosis not present

## 2021-12-08 ENCOUNTER — Ambulatory Visit (INDEPENDENT_AMBULATORY_CARE_PROVIDER_SITE_OTHER): Payer: PPO | Admitting: *Deleted

## 2021-12-08 DIAGNOSIS — I4891 Unspecified atrial fibrillation: Secondary | ICD-10-CM

## 2021-12-08 DIAGNOSIS — B351 Tinea unguium: Secondary | ICD-10-CM | POA: Diagnosis not present

## 2021-12-08 DIAGNOSIS — M79675 Pain in left toe(s): Secondary | ICD-10-CM | POA: Diagnosis not present

## 2021-12-08 DIAGNOSIS — Z5181 Encounter for therapeutic drug level monitoring: Secondary | ICD-10-CM

## 2021-12-08 DIAGNOSIS — M79672 Pain in left foot: Secondary | ICD-10-CM | POA: Diagnosis not present

## 2021-12-08 DIAGNOSIS — M79674 Pain in right toe(s): Secondary | ICD-10-CM | POA: Diagnosis not present

## 2021-12-08 DIAGNOSIS — L110 Acquired keratosis follicularis: Secondary | ICD-10-CM | POA: Diagnosis not present

## 2021-12-08 DIAGNOSIS — M79671 Pain in right foot: Secondary | ICD-10-CM | POA: Diagnosis not present

## 2021-12-08 LAB — POCT INR: INR: 2.7 (ref 2.0–3.0)

## 2021-12-08 NOTE — Patient Instructions (Signed)
Continue warfarin 1 tablet daily except 1 1/2 tablets on Mondays, Wednesdays and Fridays Recheck in 4 weeks

## 2022-01-02 DIAGNOSIS — Z20828 Contact with and (suspected) exposure to other viral communicable diseases: Secondary | ICD-10-CM | POA: Diagnosis not present

## 2022-01-02 DIAGNOSIS — R2681 Unsteadiness on feet: Secondary | ICD-10-CM | POA: Diagnosis not present

## 2022-01-02 DIAGNOSIS — Z6823 Body mass index (BMI) 23.0-23.9, adult: Secondary | ICD-10-CM | POA: Diagnosis not present

## 2022-01-02 DIAGNOSIS — J029 Acute pharyngitis, unspecified: Secondary | ICD-10-CM | POA: Diagnosis not present

## 2022-01-02 DIAGNOSIS — R42 Dizziness and giddiness: Secondary | ICD-10-CM | POA: Diagnosis not present

## 2022-01-05 ENCOUNTER — Ambulatory Visit (INDEPENDENT_AMBULATORY_CARE_PROVIDER_SITE_OTHER): Payer: PPO | Admitting: *Deleted

## 2022-01-05 DIAGNOSIS — I4891 Unspecified atrial fibrillation: Secondary | ICD-10-CM

## 2022-01-05 DIAGNOSIS — Z5181 Encounter for therapeutic drug level monitoring: Secondary | ICD-10-CM

## 2022-01-05 LAB — POCT INR: INR: 2.7 (ref 2.0–3.0)

## 2022-01-05 NOTE — Patient Instructions (Signed)
Description   ?Continue warfarin 1 tablet daily except 1 1/2 tablets on Mondays, Wednesdays and Fridays ?Recheck in 5 weeks   ? ?  ? ? ?

## 2022-01-10 DIAGNOSIS — N400 Enlarged prostate without lower urinary tract symptoms: Secondary | ICD-10-CM | POA: Diagnosis not present

## 2022-01-10 DIAGNOSIS — E079 Disorder of thyroid, unspecified: Secondary | ICD-10-CM | POA: Diagnosis not present

## 2022-01-10 DIAGNOSIS — E039 Hypothyroidism, unspecified: Secondary | ICD-10-CM | POA: Diagnosis not present

## 2022-01-10 DIAGNOSIS — N183 Chronic kidney disease, stage 3 unspecified: Secondary | ICD-10-CM | POA: Diagnosis not present

## 2022-01-10 DIAGNOSIS — I25118 Atherosclerotic heart disease of native coronary artery with other forms of angina pectoris: Secondary | ICD-10-CM | POA: Diagnosis not present

## 2022-01-10 DIAGNOSIS — D6869 Other thrombophilia: Secondary | ICD-10-CM | POA: Diagnosis not present

## 2022-01-10 DIAGNOSIS — Z6823 Body mass index (BMI) 23.0-23.9, adult: Secondary | ICD-10-CM | POA: Diagnosis not present

## 2022-01-10 DIAGNOSIS — D692 Other nonthrombocytopenic purpura: Secondary | ICD-10-CM | POA: Diagnosis not present

## 2022-01-10 DIAGNOSIS — I509 Heart failure, unspecified: Secondary | ICD-10-CM | POA: Diagnosis not present

## 2022-01-10 DIAGNOSIS — K5090 Crohn's disease, unspecified, without complications: Secondary | ICD-10-CM | POA: Diagnosis not present

## 2022-01-10 DIAGNOSIS — I482 Chronic atrial fibrillation, unspecified: Secondary | ICD-10-CM | POA: Diagnosis not present

## 2022-01-10 DIAGNOSIS — E785 Hyperlipidemia, unspecified: Secondary | ICD-10-CM | POA: Diagnosis not present

## 2022-01-12 ENCOUNTER — Other Ambulatory Visit: Payer: Self-pay | Admitting: Cardiology

## 2022-01-13 DIAGNOSIS — I129 Hypertensive chronic kidney disease with stage 1 through stage 4 chronic kidney disease, or unspecified chronic kidney disease: Secondary | ICD-10-CM | POA: Diagnosis not present

## 2022-01-13 DIAGNOSIS — E7849 Other hyperlipidemia: Secondary | ICD-10-CM | POA: Diagnosis not present

## 2022-01-16 DIAGNOSIS — M545 Low back pain, unspecified: Secondary | ICD-10-CM | POA: Diagnosis not present

## 2022-01-16 DIAGNOSIS — M6281 Muscle weakness (generalized): Secondary | ICD-10-CM | POA: Diagnosis not present

## 2022-01-16 DIAGNOSIS — R2681 Unsteadiness on feet: Secondary | ICD-10-CM | POA: Diagnosis not present

## 2022-01-18 DIAGNOSIS — R2681 Unsteadiness on feet: Secondary | ICD-10-CM | POA: Diagnosis not present

## 2022-01-18 DIAGNOSIS — M6281 Muscle weakness (generalized): Secondary | ICD-10-CM | POA: Diagnosis not present

## 2022-01-18 DIAGNOSIS — M545 Low back pain, unspecified: Secondary | ICD-10-CM | POA: Diagnosis not present

## 2022-01-23 DIAGNOSIS — R42 Dizziness and giddiness: Secondary | ICD-10-CM | POA: Diagnosis not present

## 2022-01-23 DIAGNOSIS — Z6823 Body mass index (BMI) 23.0-23.9, adult: Secondary | ICD-10-CM | POA: Diagnosis not present

## 2022-01-23 DIAGNOSIS — I10 Essential (primary) hypertension: Secondary | ICD-10-CM | POA: Diagnosis not present

## 2022-01-24 DIAGNOSIS — I10 Essential (primary) hypertension: Secondary | ICD-10-CM | POA: Diagnosis not present

## 2022-01-24 DIAGNOSIS — K56609 Unspecified intestinal obstruction, unspecified as to partial versus complete obstruction: Secondary | ICD-10-CM | POA: Diagnosis not present

## 2022-01-24 DIAGNOSIS — S6992XA Unspecified injury of left wrist, hand and finger(s), initial encounter: Secondary | ICD-10-CM | POA: Diagnosis not present

## 2022-01-24 DIAGNOSIS — S52502A Unspecified fracture of the lower end of left radius, initial encounter for closed fracture: Secondary | ICD-10-CM | POA: Diagnosis not present

## 2022-01-24 DIAGNOSIS — S52612A Displaced fracture of left ulna styloid process, initial encounter for closed fracture: Secondary | ICD-10-CM | POA: Diagnosis not present

## 2022-01-24 DIAGNOSIS — W1839XA Other fall on same level, initial encounter: Secondary | ICD-10-CM | POA: Diagnosis not present

## 2022-01-30 DIAGNOSIS — E559 Vitamin D deficiency, unspecified: Secondary | ICD-10-CM | POA: Diagnosis not present

## 2022-01-30 DIAGNOSIS — E782 Mixed hyperlipidemia: Secondary | ICD-10-CM | POA: Diagnosis not present

## 2022-01-30 DIAGNOSIS — R42 Dizziness and giddiness: Secondary | ICD-10-CM | POA: Diagnosis not present

## 2022-01-30 DIAGNOSIS — E7800 Pure hypercholesterolemia, unspecified: Secondary | ICD-10-CM | POA: Diagnosis not present

## 2022-01-30 DIAGNOSIS — R519 Headache, unspecified: Secondary | ICD-10-CM | POA: Diagnosis not present

## 2022-01-30 DIAGNOSIS — K2100 Gastro-esophageal reflux disease with esophagitis, without bleeding: Secondary | ICD-10-CM | POA: Diagnosis not present

## 2022-01-30 DIAGNOSIS — H9319 Tinnitus, unspecified ear: Secondary | ICD-10-CM | POA: Diagnosis not present

## 2022-01-30 DIAGNOSIS — W19XXXA Unspecified fall, initial encounter: Secondary | ICD-10-CM | POA: Diagnosis not present

## 2022-01-30 DIAGNOSIS — I10 Essential (primary) hypertension: Secondary | ICD-10-CM | POA: Diagnosis not present

## 2022-01-30 DIAGNOSIS — N183 Chronic kidney disease, stage 3 unspecified: Secondary | ICD-10-CM | POA: Diagnosis not present

## 2022-01-30 DIAGNOSIS — Z131 Encounter for screening for diabetes mellitus: Secondary | ICD-10-CM | POA: Diagnosis not present

## 2022-01-30 DIAGNOSIS — Z6823 Body mass index (BMI) 23.0-23.9, adult: Secondary | ICD-10-CM | POA: Diagnosis not present

## 2022-01-30 DIAGNOSIS — E039 Hypothyroidism, unspecified: Secondary | ICD-10-CM | POA: Diagnosis not present

## 2022-01-31 DIAGNOSIS — W010XXA Fall on same level from slipping, tripping and stumbling without subsequent striking against object, initial encounter: Secondary | ICD-10-CM | POA: Diagnosis not present

## 2022-01-31 DIAGNOSIS — M2391 Unspecified internal derangement of right knee: Secondary | ICD-10-CM | POA: Diagnosis not present

## 2022-01-31 DIAGNOSIS — M25532 Pain in left wrist: Secondary | ICD-10-CM | POA: Diagnosis not present

## 2022-01-31 DIAGNOSIS — S52502A Unspecified fracture of the lower end of left radius, initial encounter for closed fracture: Secondary | ICD-10-CM | POA: Diagnosis not present

## 2022-02-03 DIAGNOSIS — N189 Chronic kidney disease, unspecified: Secondary | ICD-10-CM | POA: Diagnosis not present

## 2022-02-03 DIAGNOSIS — K56600 Partial intestinal obstruction, unspecified as to cause: Secondary | ICD-10-CM | POA: Diagnosis not present

## 2022-02-03 DIAGNOSIS — E7801 Familial hypercholesterolemia: Secondary | ICD-10-CM | POA: Diagnosis not present

## 2022-02-03 DIAGNOSIS — I10 Essential (primary) hypertension: Secondary | ICD-10-CM | POA: Diagnosis not present

## 2022-02-03 DIAGNOSIS — E039 Hypothyroidism, unspecified: Secondary | ICD-10-CM | POA: Diagnosis not present

## 2022-02-03 DIAGNOSIS — N1330 Unspecified hydronephrosis: Secondary | ICD-10-CM | POA: Diagnosis not present

## 2022-02-03 DIAGNOSIS — I482 Chronic atrial fibrillation, unspecified: Secondary | ICD-10-CM | POA: Diagnosis not present

## 2022-02-07 DIAGNOSIS — M25561 Pain in right knee: Secondary | ICD-10-CM | POA: Diagnosis not present

## 2022-02-07 DIAGNOSIS — M25532 Pain in left wrist: Secondary | ICD-10-CM | POA: Diagnosis not present

## 2022-02-15 ENCOUNTER — Ambulatory Visit (INDEPENDENT_AMBULATORY_CARE_PROVIDER_SITE_OTHER): Payer: PPO | Admitting: *Deleted

## 2022-02-15 DIAGNOSIS — Z5181 Encounter for therapeutic drug level monitoring: Secondary | ICD-10-CM | POA: Diagnosis not present

## 2022-02-15 DIAGNOSIS — I4891 Unspecified atrial fibrillation: Secondary | ICD-10-CM

## 2022-02-15 LAB — POCT INR: INR: 2.2 (ref 2.0–3.0)

## 2022-02-15 NOTE — Patient Instructions (Signed)
Description   ?Continue warfarin 1 tablet daily except 1 1/2 tablets on Mondays, Wednesdays and Fridays ?Recheck in 6 weeks   ? ?  ? ? ?

## 2022-02-20 DIAGNOSIS — S82001A Unspecified fracture of right patella, initial encounter for closed fracture: Secondary | ICD-10-CM | POA: Diagnosis not present

## 2022-02-20 DIAGNOSIS — S52502A Unspecified fracture of the lower end of left radius, initial encounter for closed fracture: Secondary | ICD-10-CM | POA: Diagnosis not present

## 2022-02-20 DIAGNOSIS — M25532 Pain in left wrist: Secondary | ICD-10-CM | POA: Diagnosis not present

## 2022-02-21 DIAGNOSIS — H6121 Impacted cerumen, right ear: Secondary | ICD-10-CM | POA: Diagnosis not present

## 2022-02-28 ENCOUNTER — Encounter (INDEPENDENT_AMBULATORY_CARE_PROVIDER_SITE_OTHER): Payer: Self-pay | Admitting: Internal Medicine

## 2022-02-28 ENCOUNTER — Ambulatory Visit (INDEPENDENT_AMBULATORY_CARE_PROVIDER_SITE_OTHER): Payer: PPO | Admitting: Internal Medicine

## 2022-02-28 VITALS — BP 170/62 | HR 84 | Temp 97.8°F | Ht 68.0 in | Wt 149.2 lb

## 2022-02-28 DIAGNOSIS — R634 Abnormal weight loss: Secondary | ICD-10-CM

## 2022-02-28 DIAGNOSIS — K50018 Crohn's disease of small intestine with other complication: Secondary | ICD-10-CM | POA: Diagnosis not present

## 2022-02-28 DIAGNOSIS — K219 Gastro-esophageal reflux disease without esophagitis: Secondary | ICD-10-CM

## 2022-02-28 NOTE — Patient Instructions (Signed)
Eat two or three snacks in addition to three meals daily. ?

## 2022-02-28 NOTE — Progress Notes (Signed)
Presenting complaint;  Follow-up for small bowel Crohn's disease, history of GERD. Impaired defecation.  Database and subjective:  Patient is 88 year old Caucasian male who is here for scheduled visit.  He was last seen on 10/30/2020.  He is accompanied by his wife Inez Catalina.  He was diagnosed with Crohn disease in October 2020 when he presented with intestinal obstruction and underwent small bowel resection.  His recovery was slow as he developed C. difficile colitis and had to be read hospitalized.  He was initially treated with budesonide and mesalamine but he relapsed of steroid.  He was therefore started on Humira in May last year.  Patient states he is doing well.  He denies nausea vomiting abdominal pain.  He may occasionally feel bloated after supper.  However he remains with good appetite.  He says his bowels move daily or every other day.  According to his wife he is using suppository 3 times a week.  He feels that helps with evacuation.  He he says he never has an urge to have a bowel movement.  He denies melena or rectal bleeding. He does complain of fatigue and chronic low back pain.  He does not feel that fatigue is more pronounced soon after he received biologic. He tripped over a wire and fell on 01/24/2022.  He developed left wrist fracture but did not require surgery. He says heartburn is well controlled with famotidine.  He rarely has swallowing difficulty with solids but has not had any episode of food impaction. He also brings a copy of his recent blood work.  Review of his records revealed that he has not been to emergency room since February 2022.  He presented with nausea vomiting dehydration and diarrhea.  He subsequently was found to have C. difficile and treated with vancomycin.     Current Medications: Outpatient Encounter Medications as of 02/28/2022  Medication Sig   acetaminophen (TYLENOL) 500 MG tablet Take 500 mg by mouth every 6 (six) hours as needed for mild pain  or moderate pain.   Adalimumab (HUMIRA) 40 MG/0.4ML PSKT Inject 40 mg into the skin every 14 (fourteen) days.   carvedilol (COREG) 6.25 MG tablet TAKE ONE TABLET BY MOUTH TWICE DAILY   Cholecalciferol (VITAMIN D3) 50 MCG (2000 UT) TABS Take by mouth daily.   diltiazem (CARDIZEM CD) 180 MG 24 hr capsule Take 180 mg by mouth 2 (two) times daily.   docusate sodium (COLACE) 100 MG capsule Take 100 mg by mouth daily as needed for mild constipation.   famotidine (PEPCID) 20 MG tablet Take 20 mg by mouth at bedtime.   levothyroxine (SYNTHROID, LEVOTHROID) 75 MCG tablet Take 75 mcg by mouth daily before breakfast.   magnesium gluconate (MAGONATE) 500 MG tablet Take 500 mg by mouth 2 (two) times daily.   Melatonin 12 MG TABS Take 12 mg by mouth at bedtime.   nitroGLYCERIN (NITROSTAT) 0.4 MG SL tablet Place 0.4 mg under the tongue every 5 (five) minutes as needed. For chest pains. May repeat for up to 3 doses.   Probiotic Product (ALIGN) 4 MG CAPS Take by mouth in the morning.   warfarin (COUMADIN) 2.5 MG tablet Take 1 - 1 1/2 tablets daily as directed   [DISCONTINUED] simethicone (MYLICON) 269 MG chewable tablet Chew 125 mg by mouth every 6 (six) hours as needed for flatulence. (Patient not taking: Reported on 01/05/2022)       Dulcolax suppository per rectum as needed. No facility-administered encounter medications on file as of 02/28/2022.  Objective: Blood pressure (!) 170/62, pulse 84, temperature 97.8 F (36.6 C), temperature source Oral, height 5' 8" (1.727 m), weight 149 lb 3.2 oz (67.7 kg). Patient is alert and in no acute distress. Conjunctiva is pink. Sclera is nonicteric Oropharyngeal mucosa is normal. No neck masses or thyromegaly noted. Cardiac exam with irregular rhythm normal S1 and S2. No murmur or gallop noted. Lungs are clear to auscultation. Abdomen is symmetrical.  He has midline scar.  Bowel sounds are normal.  On palpation abdomen is soft and nontender with organomegaly or  masses. No LE edema or clubbing noted.  Labs/studies Results:      Latest Ref Rng & Units 11/19/2020    7:19 AM 04/29/2020    6:29 AM 09/25/2019    3:03 PM  CBC  WBC 4.0 - 10.5 K/uL 9.4    12.3    Hemoglobin 13.0 - 17.0 g/dL 14.5   13.3   12.2    Hematocrit 39.0 - 52.0 % 43.7   39.0   37.1    Platelets 150 - 400 K/uL 199    258         Latest Ref Rng & Units 11/19/2020    7:19 AM 04/29/2020    6:29 AM 09/25/2019    3:03 PM  CMP  Glucose 70 - 99 mg/dL 132   95   97    BUN 8 - 23 mg/dL _0 Creatinine 0.61 - 1.24 mg/dL 1.35   1.70   0.95    Sodium 135 - 145 mmol/L 136   141   137    Potassium 3.5 - 5.1 mmol/L 3.5   4.2   4.3    Chloride 98 - 111 mmol/L 100   105   102    CO2 22 - 32 mmol/L 26    27    Calcium 8.9 - 10.3 mg/dL 9.5    8.4    Total Protein 6.5 - 8.1 g/dL 6.5    5.7    Total Bilirubin 0.3 - 1.2 mg/dL 1.1    0.4    Alkaline Phos 38 - 126 U/L 67      AST 15 - 41 U/L 24    16    ALT 0 - 44 U/L 34    21         Latest Ref Rng & Units 11/19/2020    7:19 AM 09/25/2019    3:03 PM 06/15/2017    9:29 AM  Hepatic Function  Total Protein 6.5 - 8.1 g/dL 6.5   5.7   6.2    Albumin 3.5 - 5.0 g/dL 3.6    3.9    AST 15 - 41 U/L _1 ALT 0 - 44 U/L 34   21   10    Alk Phosphatase 38 - 126 U/L 67    65    Total Bilirubin 0.3 - 1.2 mg/dL 1.1   0.4   0.6    Bilirubin, Direct <=0.2 mg/dL   0.1      Lab data from 01/30/2022 WBC 5.8, H&H 13.7 and 41.3.  Platelet count 154 K. Glucose 103, BUN 17, creatinine 1.29 Serum sodium 141, potassium 4.6, chloride 103, CO2 25 Serum calcium 9.4. Total protein 7.4 albumin 3.9.  Serum globulin 3.5 Bilirubin 0.6, AP 79, AST 22, ALT 21. A1c 5.5 Vitamin D level 26.5.  Assessment:  #1.  Small  bowel Crohn's disease.  He is status post small bowel resection back in October 22.  He has known stricture.  He appears to have done well since he was begun on Humira/adalimumab 1 year ago.  He is not having any side effects.   Fatigue is nonspecific but certainly biologic could be contributing to it.  Given how well he is doing I would recommend continuing this medication unless he develops antibodies or side effects.  #2.  GERD.  He is doing well with dietary measures and low-dose famotidine.  #3.  Defecation disorder.  He never has an urge to have a bowel movement.  He appears to be doing much better with Dulcolax suppository.  I do not see any point in doing anorectal manometry at this point.  #4.  Vitamin D deficiency.  He is on oral supplement.  He should have vitamin D level checked with his next blood work.  #5.  Weight loss.  He has lost 4 pounds since his last visit 6 months ago.  He seem to have good appetite.  He does not have any worrisome symptoms.  We will recheck weight at next visit.   Plan:  Patient is due for hepatitis B surface antigen and QuantiFERON TB Gold plus.  Paperwork given to patient. Continue Humira/adalimumab at a dose of 40 mg SQ every 14 days. Patient advised to eat at least 2 snacks between meals daily. Patient will call office if any side effects otherwise return for office visit with Dr. Castaneda in 6 months      

## 2022-03-06 DIAGNOSIS — K50018 Crohn's disease of small intestine with other complication: Secondary | ICD-10-CM | POA: Diagnosis not present

## 2022-03-10 LAB — HEPATITIS B SURFACE ANTIGEN: Hepatitis B Surface Ag: NONREACTIVE

## 2022-03-10 LAB — QUANTIFERON-TB GOLD PLUS
Mitogen-NIL: >10 IU/mL
NIL: 0.03 IU/mL
QuantiFERON-TB Gold Plus: NEGATIVE
TB1-NIL: 0 IU/mL
TB2-NIL: 0.01 IU/mL

## 2022-03-16 DIAGNOSIS — I739 Peripheral vascular disease, unspecified: Secondary | ICD-10-CM | POA: Diagnosis not present

## 2022-03-16 DIAGNOSIS — L110 Acquired keratosis follicularis: Secondary | ICD-10-CM | POA: Diagnosis not present

## 2022-03-16 DIAGNOSIS — M79671 Pain in right foot: Secondary | ICD-10-CM | POA: Diagnosis not present

## 2022-03-16 DIAGNOSIS — M79672 Pain in left foot: Secondary | ICD-10-CM | POA: Diagnosis not present

## 2022-03-16 DIAGNOSIS — M79674 Pain in right toe(s): Secondary | ICD-10-CM | POA: Diagnosis not present

## 2022-03-16 DIAGNOSIS — M79675 Pain in left toe(s): Secondary | ICD-10-CM | POA: Diagnosis not present

## 2022-03-20 DIAGNOSIS — S52502A Unspecified fracture of the lower end of left radius, initial encounter for closed fracture: Secondary | ICD-10-CM | POA: Diagnosis not present

## 2022-03-22 DIAGNOSIS — Z6823 Body mass index (BMI) 23.0-23.9, adult: Secondary | ICD-10-CM | POA: Diagnosis not present

## 2022-03-22 DIAGNOSIS — K5090 Crohn's disease, unspecified, without complications: Secondary | ICD-10-CM | POA: Diagnosis not present

## 2022-03-23 DIAGNOSIS — E46 Unspecified protein-calorie malnutrition: Secondary | ICD-10-CM | POA: Diagnosis not present

## 2022-03-23 DIAGNOSIS — E785 Hyperlipidemia, unspecified: Secondary | ICD-10-CM | POA: Diagnosis not present

## 2022-03-23 DIAGNOSIS — D692 Other nonthrombocytopenic purpura: Secondary | ICD-10-CM | POA: Diagnosis not present

## 2022-03-23 DIAGNOSIS — Z6823 Body mass index (BMI) 23.0-23.9, adult: Secondary | ICD-10-CM | POA: Diagnosis not present

## 2022-03-23 DIAGNOSIS — K5090 Crohn's disease, unspecified, without complications: Secondary | ICD-10-CM | POA: Diagnosis not present

## 2022-03-23 DIAGNOSIS — Z79620 Long term (current) use of immunosuppressive biologic: Secondary | ICD-10-CM | POA: Diagnosis not present

## 2022-03-29 ENCOUNTER — Ambulatory Visit (INDEPENDENT_AMBULATORY_CARE_PROVIDER_SITE_OTHER): Payer: PPO | Admitting: *Deleted

## 2022-03-29 DIAGNOSIS — I4891 Unspecified atrial fibrillation: Secondary | ICD-10-CM

## 2022-03-29 DIAGNOSIS — Z5181 Encounter for therapeutic drug level monitoring: Secondary | ICD-10-CM | POA: Diagnosis not present

## 2022-03-29 LAB — POCT INR: INR: 3.3 — AB (ref 2.0–3.0)

## 2022-03-29 NOTE — Patient Instructions (Signed)
Hold warfarin tonight then resume 1 tablet daily except 1 1/2 tablets on Mondays, Wednesdays and Fridays Recheck in 6 weeks

## 2022-04-05 ENCOUNTER — Other Ambulatory Visit (INDEPENDENT_AMBULATORY_CARE_PROVIDER_SITE_OTHER): Payer: Self-pay | Admitting: Gastroenterology

## 2022-04-05 ENCOUNTER — Other Ambulatory Visit (INDEPENDENT_AMBULATORY_CARE_PROVIDER_SITE_OTHER): Payer: Self-pay

## 2022-04-05 ENCOUNTER — Telehealth (INDEPENDENT_AMBULATORY_CARE_PROVIDER_SITE_OTHER): Payer: Self-pay

## 2022-04-05 DIAGNOSIS — M25532 Pain in left wrist: Secondary | ICD-10-CM | POA: Diagnosis not present

## 2022-04-05 DIAGNOSIS — S52502A Unspecified fracture of the lower end of left radius, initial encounter for closed fracture: Secondary | ICD-10-CM | POA: Diagnosis not present

## 2022-04-05 DIAGNOSIS — K50012 Crohn's disease of small intestine with intestinal obstruction: Secondary | ICD-10-CM

## 2022-04-05 DIAGNOSIS — M6281 Muscle weakness (generalized): Secondary | ICD-10-CM | POA: Diagnosis not present

## 2022-04-05 MED ORDER — DICYCLOMINE HCL 20 MG PO TABS: 10.0000 mg | ORAL_TABLET | Freq: Two times a day (BID) | ORAL | 2 refills | Status: DC | PRN

## 2022-04-05 NOTE — Telephone Encounter (Signed)
I reviewed his chart and unfortunately he has a stricture in his small bowel. This measured 3.5 cm but may have worsened, we will need to get a CT enterography to evaluate this further to determine if he will benefit from endoscopic vs surgical intervention. For now he can take simethicone as needed. Is he taking any Levsin or Bentyl?  [name], can you please schedule a CT enterography? Dx: stricturing Crohn's disease.  Thanks,  Anthony Peppers, MD Gastroenterology and Hepatology [facility]

## 2022-04-05 NOTE — Telephone Encounter (Signed)
Patient made aware.

## 2022-04-05 NOTE — Telephone Encounter (Signed)
Per Patient He is not taking any Levsin, or Bentyl. He is aware we will be reaching out to him to set up the Ct enterography. If any medication called in wants it sent to [facility] in [place]. He wanted to know if any of those medications would interfere with his Warfarin 7.5 Mg on M,W,F and 2.5 mg all other days.

## 2022-04-05 NOTE — Telephone Encounter (Signed)
Patient called today stating he has a history of Crohn's, and he has been having some pressure in mid to right lower abdomin for a while now (Per patient Dr. Laural Golden aware) seems to be worsening. He says Dr. Laural Golden determined this was related to gas. He wants to know what he can do about this. He is currently not taking any simethicone. Please advise.

## 2022-04-05 NOTE — Telephone Encounter (Signed)
I sent Bentyl to his pharmacy, it does not interact with warfarin

## 2022-04-07 DIAGNOSIS — S52502A Unspecified fracture of the lower end of left radius, initial encounter for closed fracture: Secondary | ICD-10-CM | POA: Diagnosis not present

## 2022-04-07 DIAGNOSIS — M6281 Muscle weakness (generalized): Secondary | ICD-10-CM | POA: Diagnosis not present

## 2022-04-07 DIAGNOSIS — M25532 Pain in left wrist: Secondary | ICD-10-CM | POA: Diagnosis not present

## 2022-04-11 ENCOUNTER — Encounter (HOSPITAL_BASED_OUTPATIENT_CLINIC_OR_DEPARTMENT_OTHER): Payer: Self-pay

## 2022-04-11 ENCOUNTER — Ambulatory Visit (HOSPITAL_BASED_OUTPATIENT_CLINIC_OR_DEPARTMENT_OTHER)
Admission: RE | Admit: 2022-04-11 | Discharge: 2022-04-11 | Disposition: A | Discharge status: Home / Self Care | Payer: PPO | Source: Ambulatory Visit | Attending: Gastroenterology | Admitting: Gastroenterology

## 2022-04-11 DIAGNOSIS — K50012 Crohn's disease of small intestine with intestinal obstruction: Secondary | ICD-10-CM | POA: Diagnosis not present

## 2022-04-11 DIAGNOSIS — N281 Cyst of kidney, acquired: Secondary | ICD-10-CM | POA: Diagnosis not present

## 2022-04-11 DIAGNOSIS — K769 Liver disease, unspecified: Secondary | ICD-10-CM | POA: Diagnosis not present

## 2022-04-11 LAB — POCT I-STAT CREATININE: Creatinine, Ser: 1.6 mg/dL — ABNORMAL HIGH (ref 0.61–1.24)

## 2022-04-11 MED ORDER — IOHEXOL 300 MG/ML  SOLN
100.0000 mL | Freq: Once | INTRAMUSCULAR | Status: AC | PRN
  Administered 2022-04-11: 80 mL via INTRAVENOUS

## 2022-04-13 DIAGNOSIS — S52502A Unspecified fracture of the lower end of left radius, initial encounter for closed fracture: Secondary | ICD-10-CM | POA: Diagnosis not present

## 2022-04-13 DIAGNOSIS — M6281 Muscle weakness (generalized): Secondary | ICD-10-CM | POA: Diagnosis not present

## 2022-04-13 DIAGNOSIS — M25532 Pain in left wrist: Secondary | ICD-10-CM | POA: Diagnosis not present

## 2022-04-17 DIAGNOSIS — S52502A Unspecified fracture of the lower end of left radius, initial encounter for closed fracture: Secondary | ICD-10-CM | POA: Diagnosis not present

## 2022-04-17 DIAGNOSIS — M6281 Muscle weakness (generalized): Secondary | ICD-10-CM | POA: Diagnosis not present

## 2022-04-17 DIAGNOSIS — M25532 Pain in left wrist: Secondary | ICD-10-CM | POA: Diagnosis not present

## 2022-04-24 DIAGNOSIS — M25532 Pain in left wrist: Secondary | ICD-10-CM | POA: Diagnosis not present

## 2022-04-25 DIAGNOSIS — M6281 Muscle weakness (generalized): Secondary | ICD-10-CM | POA: Diagnosis not present

## 2022-04-25 DIAGNOSIS — S52502A Unspecified fracture of the lower end of left radius, initial encounter for closed fracture: Secondary | ICD-10-CM | POA: Diagnosis not present

## 2022-04-25 DIAGNOSIS — M25532 Pain in left wrist: Secondary | ICD-10-CM | POA: Diagnosis not present

## 2022-04-27 DIAGNOSIS — S52502A Unspecified fracture of the lower end of left radius, initial encounter for closed fracture: Secondary | ICD-10-CM | POA: Diagnosis not present

## 2022-04-27 DIAGNOSIS — M25532 Pain in left wrist: Secondary | ICD-10-CM | POA: Diagnosis not present

## 2022-04-27 DIAGNOSIS — M6281 Muscle weakness (generalized): Secondary | ICD-10-CM | POA: Diagnosis not present

## 2022-05-02 DIAGNOSIS — M25532 Pain in left wrist: Secondary | ICD-10-CM | POA: Diagnosis not present

## 2022-05-02 DIAGNOSIS — S52502A Unspecified fracture of the lower end of left radius, initial encounter for closed fracture: Secondary | ICD-10-CM | POA: Diagnosis not present

## 2022-05-02 DIAGNOSIS — M6281 Muscle weakness (generalized): Secondary | ICD-10-CM | POA: Diagnosis not present

## 2022-05-03 ENCOUNTER — Encounter: Payer: Self-pay | Admitting: Cardiology

## 2022-05-03 ENCOUNTER — Ambulatory Visit: Payer: PPO | Admitting: Cardiology

## 2022-05-03 ENCOUNTER — Ambulatory Visit (INDEPENDENT_AMBULATORY_CARE_PROVIDER_SITE_OTHER): Payer: PPO | Admitting: *Deleted

## 2022-05-03 VITALS — BP 138/68 | HR 61 | Ht 68.0 in | Wt 152.0 lb

## 2022-05-03 DIAGNOSIS — I4821 Permanent atrial fibrillation: Secondary | ICD-10-CM

## 2022-05-03 DIAGNOSIS — I2510 Atherosclerotic heart disease of native coronary artery without angina pectoris: Secondary | ICD-10-CM

## 2022-05-03 DIAGNOSIS — I4891 Unspecified atrial fibrillation: Secondary | ICD-10-CM

## 2022-05-03 DIAGNOSIS — Z5181 Encounter for therapeutic drug level monitoring: Secondary | ICD-10-CM | POA: Diagnosis not present

## 2022-05-03 LAB — POCT INR: INR: 2.4 (ref 2.0–3.0)

## 2022-05-03 NOTE — Progress Notes (Signed)
Cardiology Office Note  Date: 05/03/2022   ID: RIDER ERMIS, DOB 21-Feb-1931, MRN [id number]  PCP:  Manon Hilding, MD  Cardiologist:  Rozann Lesches, MD Electrophysiologist:  None   Chief Complaint  Patient presents with   Cardiac follow-up    History of Present Illness: [name] is an 88 y.o. male last seen in October 2022.  He is here with his wife for a routine visit.  Continues to do well in terms of atrial fibrillation, no palpitations or increasing shortness of breath.  Main limitations are related to significant arthritic pain and stiffness.  He is undergoing PT at this time.  He is on Coumadin with follow-up in the anticoagulation clinic.  INR today was 2.4.  He does not report any spontaneous bleeding problems.  I rechecked his blood pressure today as noted below.  They check it at home every other day and do not see systolics over the 093A.  I reviewed his medications which are stable and outlined below.  I personally reviewed his ECG which shows rate controlled atrial fibrillation with PVC.  Past Medical History:  Diagnosis Date   Anemia    Atrial fibrillation (HCC)    BPH (benign prostatic hyperplasia)    CAD (coronary artery disease)    Catheterization 2004, mild/moderate nonobstructive disease  /   nuclear, 2007, small inferior scar // no ischemia   Crohn's disease (HCC)    Elevated PSA    GERD (gastroesophageal reflux disease)    History of kidney stones    History of pneumonia    Hypothyroidism    Mitral regurgitation    SBO (small bowel obstruction) ([place])    Urinary retention    Wears glasses     Past Surgical History:  Procedure Laterality Date   AGILE CAPSULE  10/18/2011   Procedure: AGILE CAPSULE;  Surgeon: Rogene Houston, MD;  Location: [facility];  Service: Endoscopy;  Laterality: N/A;  [place]  2004   CATARACT EXTRACTION W/PHACO Right 06/27/2021   Procedure: CATARACT EXTRACTION PHACO AND INTRAOCULAR LENS  PLACEMENT (IOC);  Surgeon: Baruch Goldmann, MD;  Location: [facility] ORS;  Service: Ophthalmology;  Laterality: Right;  CDE 21.98   CATARACT EXTRACTION W/PHACO Left 07/11/2021   Procedure: CATARACT EXTRACTION PHACO AND INTRAOCULAR LENS PLACEMENT LEFT EYE;  Surgeon: Baruch Goldmann, MD;  Location: [facility];  Service: Ophthalmology;  Laterality: Left;  CDE=10.42   CHOLECYSTECTOMY  2010   Dr. Anthony Sar   COLONOSCOPY  2008   [place]   COLONOSCOPY N/A 03/23/2016   Procedure: COLONOSCOPY;  Surgeon: Rogene Houston, MD;  Location: [facility];  Service: Endoscopy;  Laterality: N/A;  1:00   CYSTOSCOPY WITH INSERTION OF UROLIFT     ESOPHAGOGASTRODUODENOSCOPY  11/08/2011   Procedure: ESOPHAGOGASTRODUODENOSCOPY (EGD);  Surgeon: Rogene Houston, MD;  Location: [facility];  Service: Endoscopy;  Laterality: N/A;  300   POLYPECTOMY  03/23/2016   Procedure: POLYPECTOMY;  Surgeon: Rogene Houston, MD;  Location: [facility];  Service: Endoscopy;;  Cecal polyp removed via cold forceps recto-sigmoid polyp removed via cold snare   TRANSURETHRAL RESECTION OF PROSTATE N/A 04/29/2020   Procedure: TRANSURETHRAL RESECTION OF THE PROSTATE (TURP);  Surgeon: Franchot Gallo, MD;  Location: [facility];  Service: Urology;  Laterality: N/A;  90 MINS    Current Outpatient Medications  Medication Sig Dispense Refill   acetaminophen (TYLENOL) 500 MG tablet Take 500 mg by mouth every 6 (six) hours  as needed for mild pain or moderate pain.     Adalimumab (HUMIRA) 40 MG/0.4ML PSKT Inject 40 mg into the skin every 14 (fourteen) days.     carvedilol (COREG) 6.25 MG tablet TAKE ONE TABLET BY MOUTH TWICE DAILY 60 tablet 6   Cholecalciferol (VITAMIN D3) 50 MCG (2000 UT) TABS Take by mouth daily.     diltiazem (CARDIZEM CD) 180 MG 24 hr capsule Take 180 mg by mouth 2 (two) times daily.     famotidine (PEPCID) 20 MG tablet Take 20 mg by mouth at bedtime.     levothyroxine (SYNTHROID, LEVOTHROID) 75 MCG tablet Take 75 mcg by  mouth daily before breakfast.     magnesium gluconate (MAGONATE) 500 MG tablet Take 500 mg by mouth 2 (two) times daily.     Melatonin 12 MG TABS Take 12 mg by mouth at bedtime.     nitroGLYCERIN (NITROSTAT) 0.4 MG SL tablet Place 0.4 mg under the tongue every 5 (five) minutes as needed. For chest pains. May repeat for up to 3 doses.     Probiotic Product (ALIGN) 4 MG CAPS Take by mouth in the morning.     warfarin (COUMADIN) 2.5 MG tablet Take 1 - 1 1/2 tablets daily as directed 45 tablet 6   dicyclomine (BENTYL) 20 MG tablet Take 0.5 tablets (10 mg total) by mouth every 12 (twelve) hours as needed for spasms. (Patient not taking: Reported on 05/03/2022) 30 tablet 2   No current facility-administered medications for this visit.   Allergies:  Patient has no known allergies.   ROS: No syncope.  Physical Exam: VS:  BP 138/68 (BP Location: Right Arm, Patient Position: Sitting, Cuff Size: Normal)   Pulse 61   Ht 5' 8"  (1.727 m)   Wt 152 lb (68.9 kg)   SpO2 97%   BMI 23.11 kg/m , BMI Body mass index is 23.11 kg/m.  Wt Readings from Last 3 Encounters:  05/03/22 152 lb (68.9 kg)  02/28/22 149 lb 3.2 oz (67.7 kg)  08/30/21 153 lb 12.8 oz (69.8 kg)    General: Patient appears comfortable at rest. HEENT: Conjunctiva and lids normal. Neck: Supple, no elevated JVP or carotid bruits, no thyromegaly. Lungs: Clear to auscultation, nonlabored breathing at rest. Cardiac: Irregularly irregular, no S3, 1/6 systolic murmur. Extremities: No pitting edema.  ECG:  An ECG dated 11/19/2020 was personally reviewed today and demonstrated:  Atrial fibrillation with low voltage and poor R wave progression, PVC.  Recent Labwork: 04/11/2022: Creatinine, Ser 1.60  April 2023: Hemoglobin 13.7, platelets 154, BUN 17, creatinine 1.29, potassium 4.6, AST 22, ALT 21, cholesterol 159, triglycerides 138, HDL 37, LDL 97, hemoglobin A1c 5.5%, TSH 4.1  Other Studies Reviewed Today:  Lexiscan Myoview 08/14/2017: There  was no ST segment deviation noted during stress. Findings consistent with prior inferoseptal myocardial infarction without current ischemia. This is a low risk study. The left ventricular ejection fraction is normal (55-65%).   Echocardiogram 05/19/2020:  1. Left ventricular ejection fraction, by estimation, is approximately  55%. Left ventricular diastolic parameters are indeterminate in the  setting of atrial fibrillation.   2. Right ventricular systolic function is normal. The right ventricular  size is normal. There is normal pulmonary artery systolic pressure. The  estimated right ventricular systolic pressure is 42.8 mmHg.   3. Left atrial size was moderately dilated.   4. The mitral valve is grossly normal. Mild to moderate mitral valve  regurgitation.   5. The aortic valve is tricuspid. Aortic  valve regurgitation is mild.   6. The inferior vena cava is normal in size with greater than 50%  respiratory variability, suggesting right atrial pressure of 3 mmHg.   Assessment and Plan:  1.  Permanent atrial fibrillation with CHA2DS2-VASc score of 3.  He is symptomatically stable with good heart rate control on Coreg and Cardizem CD.  Continues on Coumadin for stroke prophylaxis with follow-up in anticoagulation clinic.  No changes were made today.  2.  Nonobstructive CAD without active angina symptoms.  He remains on Zocor and has as needed nitroglycerin available.  Medication Adjustments/Labs and Tests Ordered: Current medicines are reviewed at length with the patient today.  Concerns regarding medicines are outlined above.   Tests Ordered: Orders Placed This Encounter  Procedures   EKG 12-Lead    Medication Changes: No orders of the defined types were placed in this encounter.   Disposition:  Follow up  6 months.  Signed, Satira Sark, MD, [facility] 05/03/2022 3:47 PM    [facility] [place], [place], [place] [address] Phone: [phone]; Fax: [phone]

## 2022-05-03 NOTE — Patient Instructions (Addendum)

## 2022-05-03 NOTE — Patient Instructions (Signed)
Continue warfarin 1 tablet daily except 1 1/2 tablets on Mondays, Wednesdays and Fridays Recheck in 6 weeks

## 2022-05-04 DIAGNOSIS — M25532 Pain in left wrist: Secondary | ICD-10-CM | POA: Diagnosis not present

## 2022-05-04 DIAGNOSIS — M6281 Muscle weakness (generalized): Secondary | ICD-10-CM | POA: Diagnosis not present

## 2022-05-04 DIAGNOSIS — S52502A Unspecified fracture of the lower end of left radius, initial encounter for closed fracture: Secondary | ICD-10-CM | POA: Diagnosis not present

## 2022-05-08 DIAGNOSIS — S52502A Unspecified fracture of the lower end of left radius, initial encounter for closed fracture: Secondary | ICD-10-CM | POA: Diagnosis not present

## 2022-05-08 DIAGNOSIS — M25532 Pain in left wrist: Secondary | ICD-10-CM | POA: Diagnosis not present

## 2022-05-08 DIAGNOSIS — M6281 Muscle weakness (generalized): Secondary | ICD-10-CM | POA: Diagnosis not present

## 2022-05-11 DIAGNOSIS — S52502A Unspecified fracture of the lower end of left radius, initial encounter for closed fracture: Secondary | ICD-10-CM | POA: Diagnosis not present

## 2022-05-11 DIAGNOSIS — M6281 Muscle weakness (generalized): Secondary | ICD-10-CM | POA: Diagnosis not present

## 2022-05-11 DIAGNOSIS — M25532 Pain in left wrist: Secondary | ICD-10-CM | POA: Diagnosis not present

## 2022-05-15 ENCOUNTER — Other Ambulatory Visit: Payer: Self-pay | Admitting: Cardiology

## 2022-05-15 DIAGNOSIS — M6281 Muscle weakness (generalized): Secondary | ICD-10-CM | POA: Diagnosis not present

## 2022-05-15 DIAGNOSIS — M25532 Pain in left wrist: Secondary | ICD-10-CM | POA: Diagnosis not present

## 2022-05-15 DIAGNOSIS — S52502A Unspecified fracture of the lower end of left radius, initial encounter for closed fracture: Secondary | ICD-10-CM | POA: Diagnosis not present

## 2022-05-18 DIAGNOSIS — M25532 Pain in left wrist: Secondary | ICD-10-CM | POA: Diagnosis not present

## 2022-05-18 DIAGNOSIS — S52502A Unspecified fracture of the lower end of left radius, initial encounter for closed fracture: Secondary | ICD-10-CM | POA: Diagnosis not present

## 2022-05-18 DIAGNOSIS — M6281 Muscle weakness (generalized): Secondary | ICD-10-CM | POA: Diagnosis not present

## 2022-05-23 DIAGNOSIS — L853 Xerosis cutis: Secondary | ICD-10-CM | POA: Diagnosis not present

## 2022-05-23 DIAGNOSIS — L570 Actinic keratosis: Secondary | ICD-10-CM | POA: Diagnosis not present

## 2022-05-23 DIAGNOSIS — L309 Dermatitis, unspecified: Secondary | ICD-10-CM | POA: Diagnosis not present

## 2022-05-26 DIAGNOSIS — E559 Vitamin D deficiency, unspecified: Secondary | ICD-10-CM | POA: Diagnosis not present

## 2022-05-26 DIAGNOSIS — Z8719 Personal history of other diseases of the digestive system: Secondary | ICD-10-CM | POA: Diagnosis not present

## 2022-05-26 DIAGNOSIS — I10 Essential (primary) hypertension: Secondary | ICD-10-CM | POA: Diagnosis not present

## 2022-05-26 DIAGNOSIS — E782 Mixed hyperlipidemia: Secondary | ICD-10-CM | POA: Diagnosis not present

## 2022-05-26 DIAGNOSIS — Z7901 Long term (current) use of anticoagulants: Secondary | ICD-10-CM | POA: Diagnosis not present

## 2022-05-26 DIAGNOSIS — E039 Hypothyroidism, unspecified: Secondary | ICD-10-CM | POA: Diagnosis not present

## 2022-05-26 DIAGNOSIS — K5090 Crohn's disease, unspecified, without complications: Secondary | ICD-10-CM | POA: Diagnosis not present

## 2022-05-26 DIAGNOSIS — E7800 Pure hypercholesterolemia, unspecified: Secondary | ICD-10-CM | POA: Diagnosis not present

## 2022-05-26 DIAGNOSIS — E7849 Other hyperlipidemia: Secondary | ICD-10-CM | POA: Diagnosis not present

## 2022-05-26 DIAGNOSIS — R7301 Impaired fasting glucose: Secondary | ICD-10-CM | POA: Diagnosis not present

## 2022-05-26 DIAGNOSIS — I482 Chronic atrial fibrillation, unspecified: Secondary | ICD-10-CM | POA: Diagnosis not present

## 2022-05-26 DIAGNOSIS — D6869 Other thrombophilia: Secondary | ICD-10-CM | POA: Diagnosis not present

## 2022-05-26 DIAGNOSIS — N183 Chronic kidney disease, stage 3 unspecified: Secondary | ICD-10-CM | POA: Diagnosis not present

## 2022-05-26 DIAGNOSIS — E7801 Familial hypercholesterolemia: Secondary | ICD-10-CM | POA: Diagnosis not present

## 2022-05-28 ENCOUNTER — Encounter (HOSPITAL_COMMUNITY): Payer: Self-pay

## 2022-05-28 ENCOUNTER — Emergency Department (HOSPITAL_COMMUNITY)
Admission: EM | Admit: 2022-05-28 | Discharge: 2022-05-28 | Disposition: A | Discharge status: Home / Self Care | Payer: PPO | Attending: Emergency Medicine | Admitting: Emergency Medicine

## 2022-05-28 ENCOUNTER — Emergency Department (HOSPITAL_COMMUNITY): Payer: PPO

## 2022-05-28 ENCOUNTER — Other Ambulatory Visit: Payer: Self-pay

## 2022-05-28 DIAGNOSIS — Z7901 Long term (current) use of anticoagulants: Secondary | ICD-10-CM | POA: Insufficient documentation

## 2022-05-28 DIAGNOSIS — I5032 Chronic diastolic (congestive) heart failure: Secondary | ICD-10-CM | POA: Insufficient documentation

## 2022-05-28 DIAGNOSIS — Z79899 Other long term (current) drug therapy: Secondary | ICD-10-CM | POA: Diagnosis not present

## 2022-05-28 DIAGNOSIS — Z20822 Contact with and (suspected) exposure to covid-19: Secondary | ICD-10-CM | POA: Diagnosis not present

## 2022-05-28 DIAGNOSIS — I2510 Atherosclerotic heart disease of native coronary artery without angina pectoris: Secondary | ICD-10-CM | POA: Insufficient documentation

## 2022-05-28 DIAGNOSIS — R0602 Shortness of breath: Secondary | ICD-10-CM | POA: Diagnosis not present

## 2022-05-28 DIAGNOSIS — R059 Cough, unspecified: Secondary | ICD-10-CM | POA: Diagnosis not present

## 2022-05-28 DIAGNOSIS — E039 Hypothyroidism, unspecified: Secondary | ICD-10-CM | POA: Diagnosis not present

## 2022-05-28 LAB — COMPREHENSIVE METABOLIC PANEL WITH GFR
ALT: 15 U/L (ref 0–44)
AST: 20 U/L (ref 15–41)
Albumin: 4 g/dL (ref 3.5–5.0)
Alkaline Phosphatase: 51 U/L (ref 38–126)
Anion gap: 8 (ref 5–15)
BUN: 20 mg/dL (ref 8–23)
CO2: 24 mmol/L (ref 22–32)
Calcium: 8.9 mg/dL (ref 8.9–10.3)
Chloride: 106 mmol/L (ref 98–111)
Creatinine, Ser: 1.42 mg/dL — ABNORMAL HIGH (ref 0.61–1.24)
GFR, Estimated: 47 mL/min — ABNORMAL LOW
Glucose, Bld: 110 mg/dL — ABNORMAL HIGH (ref 70–99)
Potassium: 4.3 mmol/L (ref 3.5–5.1)
Sodium: 138 mmol/L (ref 135–145)
Total Bilirubin: 0.9 mg/dL (ref 0.3–1.2)
Total Protein: 7.3 g/dL (ref 6.5–8.1)

## 2022-05-28 LAB — CBC WITH DIFFERENTIAL/PLATELET
Abs Immature Granulocytes: 0.02 10*3/uL (ref 0.00–0.07)
Basophils Absolute: 0.1 10*3/uL (ref 0.0–0.1)
Basophils Relative: 1 %
Eosinophils Absolute: 0.1 10*3/uL (ref 0.0–0.5)
Eosinophils Relative: 2 %
HCT: 39.6 % (ref 39.0–52.0)
Hemoglobin: 13.4 g/dL (ref 13.0–17.0)
Immature Granulocytes: 0 %
Lymphocytes Relative: 32 %
Lymphs Abs: 1.7 10*3/uL (ref 0.7–4.0)
MCH: 30.8 pg (ref 26.0–34.0)
MCHC: 33.8 g/dL (ref 30.0–36.0)
MCV: 91 fL (ref 80.0–100.0)
Monocytes Absolute: 0.5 10*3/uL (ref 0.1–1.0)
Monocytes Relative: 9 %
Neutro Abs: 3 10*3/uL (ref 1.7–7.7)
Neutrophils Relative %: 56 %
Platelets: 147 10*3/uL — ABNORMAL LOW (ref 150–400)
RBC: 4.35 MIL/uL (ref 4.22–5.81)
RDW: 13.9 % (ref 11.5–15.5)
WBC: 5.5 10*3/uL (ref 4.0–10.5)
nRBC: 0 % (ref 0.0–0.2)

## 2022-05-28 LAB — BLOOD GAS, VENOUS
Acid-Base Excess: 1.7 mmol/L (ref 0.0–2.0)
Bicarbonate: 27.2 mmol/L (ref 20.0–28.0)
Drawn by: 6397
O2 Saturation: 64.9 %
Patient temperature: 36.5
pCO2, Ven: 44 mmHg (ref 44–60)
pH, Ven: 7.4 (ref 7.25–7.43)
pO2, Ven: 37 mmHg (ref 32–45)

## 2022-05-28 LAB — TROPONIN I (HIGH SENSITIVITY)
Troponin I (High Sensitivity): 4 ng/L (ref <18)
Troponin I (High Sensitivity): 4 ng/L

## 2022-05-28 LAB — RESP PANEL BY RT-PCR (FLU A&B, COVID) ARPGX2
Influenza A by PCR: NEGATIVE
Influenza B by PCR: NEGATIVE
SARS Coronavirus 2 by RT PCR: NEGATIVE

## 2022-05-28 LAB — BRAIN NATRIURETIC PEPTIDE: B Natriuretic Peptide: 105 pg/mL — ABNORMAL HIGH (ref 0.0–100.0)

## 2022-05-28 LAB — PROTIME-INR
INR: 2.9 — ABNORMAL HIGH (ref 0.8–1.2)
Prothrombin Time: 29.9 seconds — ABNORMAL HIGH (ref 11.4–15.2)

## 2022-05-28 NOTE — ED Triage Notes (Signed)
Pt presents to ED with complaints of SOB started this am. Pt with history of a fib, is on Coumadin.

## 2022-05-28 NOTE — ED Provider Notes (Signed)
[facility] EMERGENCY DEPARTMENT Provider Note  CSN: [id number] Arrival date & time: 05/28/22 0703  Chief Complaint(s) Shortness of Breath  HPI [name] is a 88 y.o. male with history of A-fib on Coumadin, Crohn's disease, coronary artery disease, presenting with shortness of breath.  Patient reports he woke up this morning, felt short of breath while sitting in recliner.  He reports that he has had a cough recently with that is nonproductive.  He otherwise denies chest pain, fevers or chills, syncope, back pain, abdominal pain, nausea or vomiting, diaphoresis, sick contacts.  Denies runny nose or sore throat.  Denies history of similar in the past.  Denies history of trauma.  Symptoms are mild.  Denies orthopnea or leg swelling.  Denies recent travel or surgeries.  Reports compliance with Coumadin.   Past Medical History Past Medical History:  Diagnosis Date   Anemia    Atrial fibrillation (HCC)    BPH (benign prostatic hyperplasia)    CAD (coronary artery disease)    Catheterization 2004, mild/moderate nonobstructive disease  /   nuclear, 2007, small inferior scar // no ischemia   Crohn's disease ([place])    Elevated PSA    GERD (gastroesophageal reflux disease)    History of kidney stones    History of pneumonia    Hypothyroidism    Mitral regurgitation    SBO (small bowel obstruction) ([place])    Urinary retention    Wears glasses    Patient Active Problem List   Diagnosis Date Noted   Weight loss 02/28/2022   Abnormal defecation 12/30/2020   Clostridioides difficile diarrhea 12/02/2020   Hoarseness 09/14/2020   Enlarged prostate with urinary obstruction 04/29/2020   Chronic diastolic heart failure ([place]) 02/17/2020   Constipation 11/11/2019   Crohn's disease ([place]) 09/25/2019   Flatulence 09/25/2019   Shortness of breath 05/06/2015   Hip pain 07/27/2014   Upper airway cough syndrome 03/22/2014   Restrictive lung disease 03/22/2014   Encounter for therapeutic drug  monitoring 12/26/2013   Hx SBO 09/18/2011   Carotid bruit    Fatigue    CAD (coronary artery disease)    Atrial fibrillation (HCC)    GERD (gastroesophageal reflux disease)    Clot    Mitral regurgitation    Warfarin anticoagulation    Dizziness 03/16/2010   Nausea vomiting and diarrhea 01/14/2010   Ejection fraction 02/13/2009   Home Medication(s) Prior to Admission medications   Medication Sig Start Date End Date Taking? Authorizing Provider  acetaminophen (TYLENOL) 500 MG tablet Take 500 mg by mouth every 6 (six) hours as needed for mild pain or moderate pain.    [provider]  Adalimumab (HUMIRA) 40 MG/0.4ML PSKT Inject 40 mg into the skin every 14 (fourteen) days.    [provider]  carvedilol (COREG) 6.25 MG tablet TAKE ONE TABLET BY MOUTH TWICE DAILY 05/15/22   Satira Sark, MD  Cholecalciferol (VITAMIN D3) 50 MCG (2000 UT) TABS Take by mouth daily.    [provider]  dicyclomine (BENTYL) 20 MG tablet Take 0.5 tablets (10 mg total) by mouth every 12 (twelve) hours as needed for spasms. Patient not taking: Reported on 05/03/2022 04/05/22   Harvel Quale, MD  diltiazem (CARDIZEM CD) 180 MG 24 hr capsule Take 180 mg by mouth 2 (two) times daily.    [provider]  famotidine (PEPCID) 20 MG tablet Take 20 mg by mouth at bedtime.    [provider]  levothyroxine ([place]) 75  MCG tablet Take 75 mcg by mouth daily before breakfast.    [provider]  magnesium gluconate (MAGONATE) 500 MG tablet Take 500 mg by mouth 2 (two) times daily. 12/02/20   Rogene Houston, MD  Melatonin 12 MG TABS Take 12 mg by mouth at bedtime.    [provider]  nitroGLYCERIN (NITROSTAT) 0.4 MG SL tablet Place 0.4 mg under the tongue every 5 (five) minutes as needed. For chest pains. May repeat for up to 3 doses.    [provider]  Probiotic Product (ALIGN) 4 MG CAPS Take by mouth in the morning.     [provider]  warfarin (COUMADIN) 2.5 MG tablet Take 1 - 1 1/2 tablets daily as directed 01/12/22   Satira Sark, MD                                                                                                                                    Past Surgical History Past Surgical History:  Procedure Laterality Date   AGILE CAPSULE  10/18/2011   Procedure: AGILE CAPSULE;  Surgeon: Rogene Houston, MD;  Location: [facility];  Service: Endoscopy;  Laterality: N/A;  [place]  2004   CATARACT EXTRACTION W/PHACO Right 06/27/2021   Procedure: CATARACT EXTRACTION PHACO AND INTRAOCULAR LENS PLACEMENT (IOC);  Surgeon: Baruch Goldmann, MD;  Location: [facility] ORS;  Service: Ophthalmology;  Laterality: Right;  CDE 21.98   CATARACT EXTRACTION W/PHACO Left 07/11/2021   Procedure: CATARACT EXTRACTION PHACO AND INTRAOCULAR LENS PLACEMENT LEFT EYE;  Surgeon: Baruch Goldmann, MD;  Location: [facility] ORS;  Service: Ophthalmology;  Laterality: Left;  CDE=10.42   CHOLECYSTECTOMY  2010   Dr. Anthony Sar   COLONOSCOPY  2008   [place]   COLONOSCOPY N/A 03/23/2016   Procedure: COLONOSCOPY;  Surgeon: Rogene Houston, MD;  Location: [facility];  Service: Endoscopy;  Laterality: N/A;  1:00   CYSTOSCOPY WITH INSERTION OF UROLIFT     ESOPHAGOGASTRODUODENOSCOPY  11/08/2011   Procedure: ESOPHAGOGASTRODUODENOSCOPY (EGD);  Surgeon: Rogene Houston, MD;  Location: [facility];  Service: Endoscopy;  Laterality: N/A;  300   POLYPECTOMY  03/23/2016   Procedure: POLYPECTOMY;  Surgeon: Rogene Houston, MD;  Location: [facility];  Service: Endoscopy;;  Cecal polyp removed via cold forceps recto-sigmoid polyp removed via cold snare   TRANSURETHRAL RESECTION OF PROSTATE N/A 04/29/2020   Procedure: TRANSURETHRAL RESECTION OF THE PROSTATE (TURP);  Surgeon: Franchot Gallo, MD;  Location: [facility];  Service: Urology;  Laterality: N/A;  107 MINS   Family History Family History  Problem  Relation Age of Onset   Colon cancer Mother    Heart disease Father    Stroke Brother    Healthy Daughter     Social History Social History   Tobacco Use   Smoking status: Never    Passive exposure: Never   Smokeless tobacco:  Never  Vaping Use   Vaping Use: Never used  Substance Use Topics   Alcohol use: No    Alcohol/week: 0.0 standard drinks of alcohol   Drug use: No   Allergies Patient has no known allergies.  Review of Systems Review of Systems  All other systems reviewed and are negative.   Physical Exam Vital Signs  I have reviewed the triage vital signs BP (!) 168/82   Pulse 91   Temp 97.9 F (36.6 C) (Oral)   Resp (!) 21   Ht 5' 8"  (1.727 m)   Wt 70.8 kg   SpO2 97%   BMI 23.72 kg/m  Physical Exam Vitals and nursing note reviewed.  Constitutional:      General: He is not in acute distress.    Appearance: Normal appearance.  HENT:     Mouth/Throat:     Mouth: Mucous membranes are moist.  Cardiovascular:     Rate and Rhythm: Normal rate and regular rhythm.  Pulmonary:     Effort: Pulmonary effort is normal. No respiratory distress.     Breath sounds: Normal breath sounds.  Abdominal:     General: Abdomen is flat.     Palpations: Abdomen is soft.     Tenderness: There is no abdominal tenderness.  Musculoskeletal:     Right lower leg: No edema.     Left lower leg: No edema.  Skin:    General: Skin is warm and dry.     Capillary Refill: Capillary refill takes less than 2 seconds.  Neurological:     Mental Status: He is alert and oriented to person, place, and time. Mental status is at baseline.  Psychiatric:        Mood and Affect: Mood normal.        Behavior: Behavior normal.     ED Results and Treatments Labs (all labs ordered are listed, but only abnormal results are displayed) Labs Reviewed  COMPREHENSIVE METABOLIC PANEL - Abnormal; Notable for the following components:      Result Value   Glucose, Bld 110 (*)    Creatinine, Ser  1.42 (*)    GFR, Estimated 47 (*)    All other components within normal limits  CBC WITH DIFFERENTIAL/PLATELET - Abnormal; Notable for the following components:   Platelets 147 (*)    All other components within normal limits  PROTIME-INR - Abnormal; Notable for the following components:   Prothrombin Time 29.9 (*)    INR 2.9 (*)    All other components within normal limits  BRAIN NATRIURETIC PEPTIDE - Abnormal; Notable for the following components:   B Natriuretic Peptide 105.0 (*)    All other components within normal limits  RESP PANEL BY RT-PCR (FLU A&B, COVID) ARPGX2  BLOOD GAS, VENOUS  TROPONIN I (HIGH SENSITIVITY)  TROPONIN I (HIGH SENSITIVITY)  Radiology DG Chest 2 View  Result Date: 05/28/2022 CLINICAL DATA:  Cough, short of breath EXAM: CHEST - 2 VIEW COMPARISON:  Prior chest x-ray 05/18/2014 FINDINGS: Cardiac and mediastinal contours are within normal limits. Mild bronchitic changes. No pneumothorax or pleural effusion. No focal airspace infiltrate. Surgical clips in the right upper quadrant consistent with prior cholecystectomy. IMPRESSION: No acute osseous abnormality. Chronic appearing bronchitic changes. Electronically Signed   By: Jacqulynn Cadet M.D.   On: 05/28/2022 08:03    Pertinent labs & imaging results that were available during my care of the patient were reviewed by me and considered in my medical decision making (see MDM for details).  Medications Ordered in ED Medications - No data to display                                                                                                                                   Procedures .1-3 Lead EKG Interpretation  Performed by: Cristie Hem, MD Authorized by: Cristie Hem, MD     Interpretation: abnormal     ECG rate assessment: normal     Rhythm: atrial fibrillation      Ectopy: none     (including critical care time)  Medical Decision Making / ED Course   MDM:  88 year old male presenting to the emergency department with shortness of breath.  Physical exam, patient is very well-appearing.  No focal lung findings on exam.  He is not hypoxic.  He is not with obvious increased work of breathing or rate on exam, but borderline tachypnea.  Differential includes pneumonia, pneumothorax, CHF, ACS.  Doubt COPD without wheezing.  Doubt PE without chest pain, patient anticoagulated at baseline and no physical exam findings concerning for DVT.  Lower concern for CHF without peripheral edema or crackles on exam.  Lower concern for ACS without chest pain, EKG without acute ST or T wave changes, demonstrating A-fib is chronic.  Will check chest x-ray.  Will check labs including troponin, BNP.  Will reassess closely.  Clinical Course as of 05/28/22 1116  Sun May 28, 2022  1114 Patient reports symptoms have resolved. Work-up overall very reassuring.  Troponin negative x2.  BNP slightly elevated but no pulmonary edema on chest x-ray.  No sign of pneumonia or pneumothorax on chest x-ray.  Given very reassuring work-up, will discharge with close primary care follow-up and strict return precautions. Will discharge patient to home. All questions answered. Patient comfortable with plan of discharge. Return precautions discussed with patient and specified on the after visit summary.  [WS]    Clinical Course User Index [WS] Cristie Hem, MD     Additional history obtained: -Additional history obtained from wife -External records from outside source obtained and reviewed including: Chart review including previous notes, labs, imaging, consultation notes   Lab Tests: -I ordered, reviewed, and interpreted labs.   The pertinent results include:   Labs Reviewed  COMPREHENSIVE METABOLIC PANEL - Abnormal; Notable for the following components:      Result Value    Glucose, Bld 110 (*)    Creatinine, Ser 1.42 (*)    GFR, Estimated 47 (*)    All other components within normal limits  CBC WITH DIFFERENTIAL/PLATELET - Abnormal; Notable for the following components:   Platelets 147 (*)    All other components within normal limits  PROTIME-INR - Abnormal; Notable for the following components:   Prothrombin Time 29.9 (*)    INR 2.9 (*)    All other components within normal limits  BRAIN NATRIURETIC PEPTIDE - Abnormal; Notable for the following components:   B Natriuretic Peptide 105.0 (*)    All other components within normal limits  RESP PANEL BY RT-PCR (FLU A&B, COVID) ARPGX2  BLOOD GAS, VENOUS  TROPONIN I (HIGH SENSITIVITY)  TROPONIN I (HIGH SENSITIVITY)      EKG   EKG Interpretation  Date/Time:  Sunday May 28 2022 07:16:19 EDT Ventricular Rate:  72 PR Interval:    QRS Duration: 90 QT Interval:  392 QTC Calculation: 429 R Axis:   42 Text Interpretation: Atrial fibrillation Anterior infarct, old Confirmed by Garnette Gunner [id number]) on 05/28/2022 7:58:31 AM         Imaging Studies ordered: I ordered imaging studies including CXR I independently visualized and interpreted imaging. I agree with the radiologist interpretation   Medicines ordered and prescription drug management: No orders of the defined types were placed in this encounter.   -I have reviewed the patients home medicines and have made adjustments as needed   Cardiac Monitoring: The patient was maintained on a cardiac monitor.  I personally viewed and interpreted the cardiac monitored which showed an underlying rhythm of: afib   Reevaluation: After the interventions noted above, I reevaluated the patient and found that they have :resolved  Co morbidities that complicate the patient evaluation  Past Medical History:  Diagnosis Date   Anemia    Atrial fibrillation ([place])    BPH (benign prostatic hyperplasia)    CAD (coronary artery disease)     Catheterization 2004, mild/moderate nonobstructive disease  /   nuclear, 2007, small inferior scar // no ischemia   Crohn's disease ([place])    Elevated PSA    GERD (gastroesophageal reflux disease)    History of kidney stones    History of pneumonia    Hypothyroidism    Mitral regurgitation    SBO (small bowel obstruction) ([place])    Urinary retention    Wears glasses       Dispostion: I considered admission for this patient, and reassuring work-up, negative imaging, and resolution of symptoms, will discharge with very close outpatient follow-up and strict return precautions.     Final Clinical Impression(s) / ED Diagnoses Final diagnoses:  Shortness of breath     This chart was dictated using voice recognition software.  Despite best efforts to proofread,  errors can occur which can change the documentation meaning.    Cristie Hem, MD 05/28/22 1116

## 2022-05-28 NOTE — Discharge Instructions (Addendum)
We evaluated you today in the emergency department for your shortness of breath.  Your testing was reassuring.  Your cardiac enzymes were negative.  Your chest x-ray did not show sign of a pneumonia or collapsed lung.  Please follow-up closely with your primary doctor.  Please keep a close eye on your symptoms, and if you develop any recurrent symptoms, or you develop chest pain, cough, fevers, nausea or vomiting, abdominal pain, please return to the emergency department for further evaluation.

## 2022-05-31 DIAGNOSIS — E7849 Other hyperlipidemia: Secondary | ICD-10-CM | POA: Diagnosis not present

## 2022-05-31 DIAGNOSIS — D649 Anemia, unspecified: Secondary | ICD-10-CM | POA: Diagnosis not present

## 2022-05-31 DIAGNOSIS — R42 Dizziness and giddiness: Secondary | ICD-10-CM | POA: Diagnosis not present

## 2022-05-31 DIAGNOSIS — K2100 Gastro-esophageal reflux disease with esophagitis, without bleeding: Secondary | ICD-10-CM | POA: Diagnosis not present

## 2022-05-31 DIAGNOSIS — E039 Hypothyroidism, unspecified: Secondary | ICD-10-CM | POA: Diagnosis not present

## 2022-05-31 DIAGNOSIS — I10 Essential (primary) hypertension: Secondary | ICD-10-CM | POA: Diagnosis not present

## 2022-05-31 DIAGNOSIS — K56600 Partial intestinal obstruction, unspecified as to cause: Secondary | ICD-10-CM | POA: Diagnosis not present

## 2022-05-31 DIAGNOSIS — R7301 Impaired fasting glucose: Secondary | ICD-10-CM | POA: Diagnosis not present

## 2022-05-31 DIAGNOSIS — I482 Chronic atrial fibrillation, unspecified: Secondary | ICD-10-CM | POA: Diagnosis not present

## 2022-05-31 DIAGNOSIS — N1330 Unspecified hydronephrosis: Secondary | ICD-10-CM | POA: Diagnosis not present

## 2022-05-31 DIAGNOSIS — Z6823 Body mass index (BMI) 23.0-23.9, adult: Secondary | ICD-10-CM | POA: Diagnosis not present

## 2022-06-01 ENCOUNTER — Ambulatory Visit (INDEPENDENT_AMBULATORY_CARE_PROVIDER_SITE_OTHER): Payer: PPO | Admitting: Gastroenterology

## 2022-06-01 ENCOUNTER — Encounter (INDEPENDENT_AMBULATORY_CARE_PROVIDER_SITE_OTHER): Payer: Self-pay | Admitting: Gastroenterology

## 2022-06-01 VITALS — BP 173/73 | HR 77 | Ht 68.0 in | Wt 150.6 lb

## 2022-06-01 DIAGNOSIS — K50012 Crohn's disease of small intestine with intestinal obstruction: Secondary | ICD-10-CM

## 2022-06-01 DIAGNOSIS — K219 Gastro-esophageal reflux disease without esophagitis: Secondary | ICD-10-CM | POA: Diagnosis not present

## 2022-06-01 DIAGNOSIS — K56699 Other intestinal obstruction unspecified as to partial versus complete obstruction: Secondary | ICD-10-CM | POA: Diagnosis not present

## 2022-06-01 NOTE — Progress Notes (Signed)
[name], M.D. Gastroenterology & Hepatology [facility] [address] [place], [place] [address]  Primary Care Physician: Anthony Hilding, MD [place] [address]  I will communicate my assessment and recommendations to the referring MD via EMR.  Problems: small bowel Crohn's disease status post resection complicated by recurrent stricture  History of C. difficile colitis GERD  History of Present Illness: [name] is a 88 y.o. male with past medical history of small bowel Crohn's disease status post resection complicated by recurrent stricture on Humira, C. difficile colitis, BPH, coronary artery disease, atrial fibrillation, GERD, hypothyroidism, who presents for follow up of Crohn's disease.  The patient was last seen by Dr. Laural Golden on 02/28/2022. At that time, the patient was continued on Humira every 2 weeks.  The patient called to our office on 04/05/2022 after presenting pressure in his mid to right lower abdomen.  I ordered a CT enterography to further evaluate the extension of his previous stricture.  CT enterography was performed on 04/11/2022 which showed borderline dilated loops of small bowel extending to 13 cm segment of small caliber mildly thick-walled terminal ileum which raised concern for fibrotic or chronically inflamed terminal ileal segment causing partial small bowel obstruction.  I called the patient to discuss these findings.  He was not interested in undergoing surgical evaluation as his symptoms improved.  Patient reports he is having some occasional discomfort in his RLQ, which he describes as a pressure like pain which does not radiate anywhere else. He states his abdominal discomfort improves when he moves his bowels.  Overall he feels better compared to prior but would like to know if he can try taking Gas-X to decrease the intermittent distention in his abdomen.He usually has a BM every 1-2  days. Very rarely uses suppositories.  States his stools is a Social worker 4-5. No melena oir hematochezia.The patient denies having any nausea, vomiting, fever, chills, hematochezia, melena, hematemesis, diarrhea, jaundice, pruritus or weight loss.  Regarding his GERD, he takes Pepcid 20 mg ever day. Has heartburn once every 1-2 weeks.  No dysphagia or odynophagia.  I received the results of most recent blood testing from 05/29/2022 which showed a CMP with potassium of 4.6, chloride 104, creatinine 1.43, BUN 15, alkaline phosphatase 50, total bili 0.8, ALT 10, AST 14, magnesium 2.2, vitamin D 31.1 and TSH 2.9.  Regarding his previous Crohn's disease history, patient has tried budesonide, mesalamine and most recently Humira every 2 weeks.  Last Colonoscopy: 03/23/2016 - Two small polyps at the recto-sigmoid colon and in the cecum, removed with a cold snare. Resected and retrieved. - Mild diverticulosis in the sigmoid colon. - External and internal hemorrhoids.  Past Medical History: Past Medical History:  Diagnosis Date   Anemia    Atrial fibrillation (HCC)    BPH (benign prostatic hyperplasia)    CAD (coronary artery disease)    Catheterization 2004, mild/moderate nonobstructive disease  /   nuclear, 2007, small inferior scar // no ischemia   Crohn's disease ([place])    Elevated PSA    GERD (gastroesophageal reflux disease)    History of kidney stones    History of pneumonia    Hypothyroidism    Mitral regurgitation    SBO (small bowel obstruction) ([place])    Urinary retention    Wears glasses     Past Surgical History: Past Surgical History:  Procedure Laterality Date   AGILE CAPSULE  10/18/2011   Procedure:  AGILE CAPSULE;  Surgeon: Rogene Houston, MD;  Location: [facility];  Service: Endoscopy;  Laterality: N/A;  [place]  2004   CATARACT EXTRACTION W/PHACO Right 06/27/2021   Procedure: CATARACT EXTRACTION PHACO AND INTRAOCULAR LENS PLACEMENT (IOC);  Surgeon:  Baruch Goldmann, MD;  Location: [facility] ORS;  Service: Ophthalmology;  Laterality: Right;  CDE 21.98   CATARACT EXTRACTION W/PHACO Left 07/11/2021   Procedure: CATARACT EXTRACTION PHACO AND INTRAOCULAR LENS PLACEMENT LEFT EYE;  Surgeon: Baruch Goldmann, MD;  Location: [facility];  Service: Ophthalmology;  Laterality: Left;  CDE=10.42   CHOLECYSTECTOMY  2010   Dr. Anthony Sar   COLONOSCOPY  2008   [place]   COLONOSCOPY N/A 03/23/2016   Procedure: COLONOSCOPY;  Surgeon: Rogene Houston, MD;  Location: [facility];  Service: Endoscopy;  Laterality: N/A;  1:00   CYSTOSCOPY WITH INSERTION OF UROLIFT     ESOPHAGOGASTRODUODENOSCOPY  11/08/2011   Procedure: ESOPHAGOGASTRODUODENOSCOPY (EGD);  Surgeon: Rogene Houston, MD;  Location: [facility];  Service: Endoscopy;  Laterality: N/A;  300   POLYPECTOMY  03/23/2016   Procedure: POLYPECTOMY;  Surgeon: Rogene Houston, MD;  Location: [facility];  Service: Endoscopy;;  Cecal polyp removed via cold forceps recto-sigmoid polyp removed via cold snare   TRANSURETHRAL RESECTION OF PROSTATE N/A 04/29/2020   Procedure: TRANSURETHRAL RESECTION OF THE PROSTATE (TURP);  Surgeon: Franchot Gallo, MD;  Location: [facility];  Service: Urology;  Laterality: N/A;  47 MINS    Family History: Family History  Problem Relation Age of Onset   Colon cancer Mother    Heart disease Father    Stroke Brother    Healthy Daughter     Social History: Social History   Tobacco Use  Smoking Status Never   Passive exposure: Never  Smokeless Tobacco Never   Social History   Substance and Sexual Activity  Alcohol Use No   Alcohol/week: 0.0 standard drinks of alcohol   Social History   Substance and Sexual Activity  Drug Use No    Allergies: No Known Allergies  Medications: Current Outpatient Medications  Medication Sig Dispense Refill   acetaminophen (TYLENOL) 500 MG tablet Take 500 mg by mouth every 6 (six) hours as needed for mild pain or moderate pain.      Adalimumab (HUMIRA) 40 MG/0.4ML PSKT Inject 40 mg into the skin every 14 (fourteen) days.     carvedilol (COREG) 6.25 MG tablet TAKE ONE TABLET BY MOUTH TWICE DAILY 60 tablet 6   Cholecalciferol (VITAMIN D3) 50 MCG (2000 UT) TABS Take by mouth daily.     diltiazem (CARDIZEM CD) 180 MG 24 hr capsule Take 180 mg by mouth 2 (two) times daily.     famotidine (PEPCID) 20 MG tablet Take 20 mg by mouth at bedtime.     levothyroxine (SYNTHROID, LEVOTHROID) 75 MCG tablet Take 75 mcg by mouth daily before breakfast.     magnesium gluconate (MAGONATE) 500 MG tablet Take 500 mg by mouth 2 (two) times daily.     Melatonin 12 MG TABS Take 15 mg by mouth at bedtime.     nitroGLYCERIN (NITROSTAT) 0.4 MG SL tablet Place 0.4 mg under the tongue every 5 (five) minutes as needed. For chest pains. May repeat for up to 3 doses.     Probiotic Product (ALIGN) 4 MG CAPS Take by mouth in the morning.     warfarin (COUMADIN) 2.5 MG tablet Take 1 - 1 1/2 tablets daily as directed  45 tablet 6   No current facility-administered medications for this visit.    Review of Systems: GENERAL: negative for malaise, night sweats HEENT: No changes in hearing or vision, no nose bleeds or other nasal problems. NECK: Negative for lumps, goiter, pain and significant neck swelling RESPIRATORY: Negative for cough, wheezing CARDIOVASCULAR: Negative for chest pain, leg swelling, palpitations, orthopnea GI: SEE HPI MUSCULOSKELETAL: Negative for joint pain or swelling, back pain, and muscle pain. SKIN: Negative for lesions, rash PSYCH: Negative for sleep disturbance, mood disorder and recent psychosocial stressors. HEMATOLOGY Negative for prolonged bleeding, bruising easily, and swollen nodes. ENDOCRINE: Negative for cold or heat intolerance, polyuria, polydipsia and goiter. NEURO: negative for tremor, gait imbalance, syncope and seizures. The remainder of the review of systems is noncontributory.   Physical Exam: BP (!) 173/73  (BP Location: Left Arm, Patient Position: Sitting, Cuff Size: Small)   Pulse 77   Ht 5' 8"  (1.727 m)   Wt 150 lb 9.6 oz (68.3 kg)   BMI 22.90 kg/m  GENERAL: The patient is AO x3, in no acute distress. HEENT: Head is normocephalic and atraumatic. EOMI are intact. Mouth is well hydrated and without lesions. NECK: Supple. No masses LUNGS: Clear to auscultation. No presence of rhonchi/wheezing/rales. Adequate chest expansion HEART: RRR, normal s1 and s2. ABDOMEN: mild discomfort upon palpation of the RLQ, no guarding, no peritoneal signs, and nondistended. BS +. No masses. EXTREMITIES: Without any cyanosis, clubbing, rash, lesions or edema. NEUROLOGIC: AOx3, no focal motor deficit. SKIN: no jaundice, no rashes  Imaging/Labs: as above  I personally reviewed and interpreted the available labs, imaging and endoscopic files.  Impression and Plan: [name] is a 88 y.o. male with past medical history of small bowel Crohn's disease status post resection complicated by recurrent stricture on Humira, C. difficile colitis, BPH, coronary artery disease, atrial fibrillation, GERD, hypothyroidism, who presents for follow up of Crohn's disease.  The patient had some baked right lower quadrant abdominal discomfort and intermittent bloating but he has a known stricture in his distal terminal ileum.  In fact, a CT enterography showed extensive narrowing of the terminal ileum which has a fibrotic component and possibly an inflammatory component as well.  He has been compliant with his Humira but I discussed with the patient and his wife the importance of determining if the levels of Humira are adequate or if he needs further optimization of the medication.  For now, as his symptoms are very mild, he would like to hold off on any repeat surgical intervention unless necessary.  Unfortunately, he is not a candidate for balloon dilation given the extension of his stricture.  For now, we will continue with Humira  twice a day and he can take Gas-X as needed for now.    - Check Humira level, CBC and iron stores the day before your next Humira dose - Continue Humira every 2 weeks - Can take Gas-X as needed for bloating - Continue famotidine 20 mg at betime, can extra dose if breakthrough episodes of heartburn  All questions were answered.      Harvel Quale, MD Gastroenterology and Hepatology [facility]

## 2022-06-01 NOTE — Patient Instructions (Addendum)
Perform blood workup the day before your next Humira dose Continue Humira every 2 weeks Can take Gas-X as needed for bloating Continue famotidine 20 mg at betime, can extra dose if breakthrough episodes of heartburn

## 2022-06-06 DIAGNOSIS — R2681 Unsteadiness on feet: Secondary | ICD-10-CM | POA: Diagnosis not present

## 2022-06-06 DIAGNOSIS — M6281 Muscle weakness (generalized): Secondary | ICD-10-CM | POA: Diagnosis not present

## 2022-06-06 DIAGNOSIS — M545 Low back pain, unspecified: Secondary | ICD-10-CM | POA: Diagnosis not present

## 2022-06-08 DIAGNOSIS — M545 Low back pain, unspecified: Secondary | ICD-10-CM | POA: Diagnosis not present

## 2022-06-08 DIAGNOSIS — R2681 Unsteadiness on feet: Secondary | ICD-10-CM | POA: Diagnosis not present

## 2022-06-08 DIAGNOSIS — M6281 Muscle weakness (generalized): Secondary | ICD-10-CM | POA: Diagnosis not present

## 2022-06-09 DIAGNOSIS — K50012 Crohn's disease of small intestine with intestinal obstruction: Secondary | ICD-10-CM | POA: Diagnosis not present

## 2022-06-09 DIAGNOSIS — K56699 Other intestinal obstruction unspecified as to partial versus complete obstruction: Secondary | ICD-10-CM | POA: Diagnosis not present

## 2022-06-12 DIAGNOSIS — H832X3 Labyrinthine dysfunction, bilateral: Secondary | ICD-10-CM | POA: Diagnosis not present

## 2022-06-12 DIAGNOSIS — R42 Dizziness and giddiness: Secondary | ICD-10-CM | POA: Diagnosis not present

## 2022-06-12 DIAGNOSIS — H9313 Tinnitus, bilateral: Secondary | ICD-10-CM | POA: Diagnosis not present

## 2022-06-12 DIAGNOSIS — H903 Sensorineural hearing loss, bilateral: Secondary | ICD-10-CM | POA: Diagnosis not present

## 2022-06-13 DIAGNOSIS — M79674 Pain in right toe(s): Secondary | ICD-10-CM | POA: Diagnosis not present

## 2022-06-13 DIAGNOSIS — M79672 Pain in left foot: Secondary | ICD-10-CM | POA: Diagnosis not present

## 2022-06-13 DIAGNOSIS — M79675 Pain in left toe(s): Secondary | ICD-10-CM | POA: Diagnosis not present

## 2022-06-13 DIAGNOSIS — I739 Peripheral vascular disease, unspecified: Secondary | ICD-10-CM | POA: Diagnosis not present

## 2022-06-13 DIAGNOSIS — L110 Acquired keratosis follicularis: Secondary | ICD-10-CM | POA: Diagnosis not present

## 2022-06-13 DIAGNOSIS — M79671 Pain in right foot: Secondary | ICD-10-CM | POA: Diagnosis not present

## 2022-06-14 ENCOUNTER — Ambulatory Visit: Payer: PPO | Attending: Cardiology | Admitting: *Deleted

## 2022-06-14 DIAGNOSIS — I4891 Unspecified atrial fibrillation: Secondary | ICD-10-CM | POA: Diagnosis not present

## 2022-06-14 DIAGNOSIS — Z5181 Encounter for therapeutic drug level monitoring: Secondary | ICD-10-CM

## 2022-06-14 DIAGNOSIS — R2681 Unsteadiness on feet: Secondary | ICD-10-CM | POA: Diagnosis not present

## 2022-06-14 DIAGNOSIS — M545 Low back pain, unspecified: Secondary | ICD-10-CM | POA: Diagnosis not present

## 2022-06-14 DIAGNOSIS — M6281 Muscle weakness (generalized): Secondary | ICD-10-CM | POA: Diagnosis not present

## 2022-06-14 LAB — POCT INR: INR: 3.1 — AB (ref 2.0–3.0)

## 2022-06-14 NOTE — Patient Instructions (Signed)
Decrease warfarin to 1 tablet daily except 1 1/2 tablets on Mondays and Fridays Recheck in 6 weeks

## 2022-06-16 LAB — C-REACTIVE PROTEIN: CRP: 1.7 mg/L (ref <8.0)

## 2022-06-16 LAB — CBC WITH DIFFERENTIAL/PLATELET
Absolute Monocytes: 667 cells/uL (ref 200–950)
Basophils Absolute: 68 cells/uL (ref 0–200)
Basophils Relative: 1.2 %
Eosinophils Absolute: 108 cells/uL (ref 15–500)
Eosinophils Relative: 1.9 %
HCT: 40.2 % (ref 38.5–50.0)
Hemoglobin: 14 g/dL (ref 13.2–17.1)
Lymphs Abs: 2286 cells/uL (ref 850–3900)
MCH: 31.7 pg (ref 27.0–33.0)
MCHC: 34.8 g/dL (ref 32.0–36.0)
MCV: 91.2 fL (ref 80.0–100.0)
Monocytes Relative: 11.7 %
Neutro Abs: 2571 cells/uL (ref 1500–7800)
Neutrophils Relative %: 45.1 %
RBC: 4.41 10*6/uL (ref 4.20–5.80)
RDW: 13.5 % (ref 11.0–15.0)
Total Lymphocyte: 40.1 %
WBC: 5.7 10*3/uL (ref 3.8–10.8)

## 2022-06-16 LAB — ADALIMUMAB DRUG LEVEL AND ANTIDRUG AB FOR IBD
ADALIMUMAB ADA,IBD: <10 AU (ref <10)
ADALIMUMAB LEVEL,IBD: 10.2 ug/mL

## 2022-06-20 DIAGNOSIS — R2681 Unsteadiness on feet: Secondary | ICD-10-CM | POA: Diagnosis not present

## 2022-06-20 DIAGNOSIS — M6281 Muscle weakness (generalized): Secondary | ICD-10-CM | POA: Diagnosis not present

## 2022-06-20 DIAGNOSIS — M545 Low back pain, unspecified: Secondary | ICD-10-CM | POA: Diagnosis not present

## 2022-06-22 DIAGNOSIS — M6281 Muscle weakness (generalized): Secondary | ICD-10-CM | POA: Diagnosis not present

## 2022-06-22 DIAGNOSIS — R2681 Unsteadiness on feet: Secondary | ICD-10-CM | POA: Diagnosis not present

## 2022-06-22 DIAGNOSIS — M545 Low back pain, unspecified: Secondary | ICD-10-CM | POA: Diagnosis not present

## 2022-06-27 DIAGNOSIS — M545 Low back pain, unspecified: Secondary | ICD-10-CM | POA: Diagnosis not present

## 2022-06-27 DIAGNOSIS — M6281 Muscle weakness (generalized): Secondary | ICD-10-CM | POA: Diagnosis not present

## 2022-06-27 DIAGNOSIS — R2681 Unsteadiness on feet: Secondary | ICD-10-CM | POA: Diagnosis not present

## 2022-06-29 DIAGNOSIS — M545 Low back pain, unspecified: Secondary | ICD-10-CM | POA: Diagnosis not present

## 2022-06-29 DIAGNOSIS — M6281 Muscle weakness (generalized): Secondary | ICD-10-CM | POA: Diagnosis not present

## 2022-06-29 DIAGNOSIS — R2681 Unsteadiness on feet: Secondary | ICD-10-CM | POA: Diagnosis not present

## 2022-07-04 DIAGNOSIS — M545 Low back pain, unspecified: Secondary | ICD-10-CM | POA: Diagnosis not present

## 2022-07-04 DIAGNOSIS — M6281 Muscle weakness (generalized): Secondary | ICD-10-CM | POA: Diagnosis not present

## 2022-07-04 DIAGNOSIS — R2681 Unsteadiness on feet: Secondary | ICD-10-CM | POA: Diagnosis not present

## 2022-07-06 DIAGNOSIS — M545 Low back pain, unspecified: Secondary | ICD-10-CM | POA: Diagnosis not present

## 2022-07-06 DIAGNOSIS — R2681 Unsteadiness on feet: Secondary | ICD-10-CM | POA: Diagnosis not present

## 2022-07-06 DIAGNOSIS — M6281 Muscle weakness (generalized): Secondary | ICD-10-CM | POA: Diagnosis not present

## 2022-07-11 DIAGNOSIS — M545 Low back pain, unspecified: Secondary | ICD-10-CM | POA: Diagnosis not present

## 2022-07-11 DIAGNOSIS — R2681 Unsteadiness on feet: Secondary | ICD-10-CM | POA: Diagnosis not present

## 2022-07-11 DIAGNOSIS — M6281 Muscle weakness (generalized): Secondary | ICD-10-CM | POA: Diagnosis not present

## 2022-07-11 DIAGNOSIS — Z23 Encounter for immunization: Secondary | ICD-10-CM | POA: Diagnosis not present

## 2022-07-13 DIAGNOSIS — M545 Low back pain, unspecified: Secondary | ICD-10-CM | POA: Diagnosis not present

## 2022-07-13 DIAGNOSIS — R2681 Unsteadiness on feet: Secondary | ICD-10-CM | POA: Diagnosis not present

## 2022-07-13 DIAGNOSIS — M6281 Muscle weakness (generalized): Secondary | ICD-10-CM | POA: Diagnosis not present

## 2022-07-24 DIAGNOSIS — Z6823 Body mass index (BMI) 23.0-23.9, adult: Secondary | ICD-10-CM | POA: Diagnosis not present

## 2022-07-24 DIAGNOSIS — M7060 Trochanteric bursitis, unspecified hip: Secondary | ICD-10-CM | POA: Diagnosis not present

## 2022-07-26 ENCOUNTER — Ambulatory Visit: Payer: PPO | Attending: Cardiology | Admitting: *Deleted

## 2022-07-26 ENCOUNTER — Telehealth (INDEPENDENT_AMBULATORY_CARE_PROVIDER_SITE_OTHER): Payer: Self-pay

## 2022-07-26 DIAGNOSIS — Z5181 Encounter for therapeutic drug level monitoring: Secondary | ICD-10-CM | POA: Diagnosis not present

## 2022-07-26 DIAGNOSIS — I4891 Unspecified atrial fibrillation: Secondary | ICD-10-CM

## 2022-07-26 LAB — POCT INR: INR: 3.1 — AB (ref 2.0–3.0)

## 2022-07-26 NOTE — Patient Instructions (Signed)
Decrease warfarin to 1 tablet daily except 1 1/2 tablets on Mondays  Recheck in 4 weeks

## 2022-07-26 NOTE — Telephone Encounter (Signed)
Patient approval for Humira from 07/21/2022- 10/16/2023 through my [employer] patient assistance. Patient made aware.

## 2022-07-31 DIAGNOSIS — L570 Actinic keratosis: Secondary | ICD-10-CM | POA: Diagnosis not present

## 2022-08-14 ENCOUNTER — Other Ambulatory Visit: Payer: Self-pay | Admitting: Cardiology

## 2022-08-14 NOTE — Telephone Encounter (Signed)
Refill request for warfarin:  Last INR was 3.1 on 07/26/22 Next INR due on 08/23/22 LOV was 06/14/22  Zandra Abts MD  Refill approved.

## 2022-08-15 DIAGNOSIS — I10 Essential (primary) hypertension: Secondary | ICD-10-CM | POA: Diagnosis not present

## 2022-08-15 DIAGNOSIS — K2100 Gastro-esophageal reflux disease with esophagitis, without bleeding: Secondary | ICD-10-CM | POA: Diagnosis not present

## 2022-08-15 DIAGNOSIS — E782 Mixed hyperlipidemia: Secondary | ICD-10-CM | POA: Diagnosis not present

## 2022-08-15 DIAGNOSIS — E039 Hypothyroidism, unspecified: Secondary | ICD-10-CM | POA: Diagnosis not present

## 2022-08-17 DIAGNOSIS — R059 Cough, unspecified: Secondary | ICD-10-CM | POA: Diagnosis not present

## 2022-08-17 DIAGNOSIS — R490 Dysphonia: Secondary | ICD-10-CM | POA: Diagnosis not present

## 2022-08-17 DIAGNOSIS — R1310 Dysphagia, unspecified: Secondary | ICD-10-CM | POA: Diagnosis not present

## 2022-08-17 DIAGNOSIS — K2100 Gastro-esophageal reflux disease with esophagitis, without bleeding: Secondary | ICD-10-CM | POA: Diagnosis not present

## 2022-08-22 DIAGNOSIS — Z23 Encounter for immunization: Secondary | ICD-10-CM | POA: Diagnosis not present

## 2022-08-23 ENCOUNTER — Ambulatory Visit: Payer: PPO | Attending: Cardiology | Admitting: *Deleted

## 2022-08-23 DIAGNOSIS — I4891 Unspecified atrial fibrillation: Secondary | ICD-10-CM | POA: Diagnosis not present

## 2022-08-23 DIAGNOSIS — Z5181 Encounter for therapeutic drug level monitoring: Secondary | ICD-10-CM

## 2022-08-23 LAB — POCT INR: INR: 3 (ref 2.0–3.0)

## 2022-08-23 NOTE — Patient Instructions (Signed)
Continue warfarin 1 tablet daily except 1 1/2 tablets on Mondays  Recheck in 4 weeks

## 2022-08-24 ENCOUNTER — Telehealth (INDEPENDENT_AMBULATORY_CARE_PROVIDER_SITE_OTHER): Payer: Self-pay

## 2022-08-24 NOTE — Telephone Encounter (Signed)
Patient called today states he recently saw his pcp Dr. Quintin Alto due to a cough which int urn causes a headache and the loss of his voice. Per Dr. Quintin Alto the patient's reflux is the cause, and he placed the patient on Pantoprazole 40 mg Qd in addition to his famotidine 20 mg QHS. Per patient he took the Pantoprazole 40 mg once per day for the last four days, but was reading that it could interfer with his coumadin levels and magnesium. He says Dr. Quintin Alto advised the patient to check with [place] regarding recommendations as you may want to do an EGD on the patient. Patient says he does not want to have to do this if at all possible, but he says he had in the past been on famotidine 40 mg and Dr.Rehman had decreased it to 20 mg QHS. Patient wants to know if he should just take famotidine 20 mg bid. Please advise. Thanks

## 2022-08-25 ENCOUNTER — Other Ambulatory Visit (INDEPENDENT_AMBULATORY_CARE_PROVIDER_SITE_OTHER): Payer: Self-pay

## 2022-08-25 DIAGNOSIS — K219 Gastro-esophageal reflux disease without esophagitis: Secondary | ICD-10-CM

## 2022-08-25 MED ORDER — FAMOTIDINE 20 MG PO TABS: 20.0000 mg | ORAL_TABLET | Freq: Two times a day (BID) | ORAL | 1 refills | Status: DC

## 2022-08-25 NOTE — Telephone Encounter (Signed)
Patient made aware and want new rx sent to [facility] as his current rx says once per day.

## 2022-08-25 NOTE — Telephone Encounter (Signed)
New rx sent to [facility].

## 2022-08-25 NOTE — Telephone Encounter (Signed)
It should be OK to take famotidine 20 mg twice a day, no need for an EGD based on those symptoms. Will need to follow in our clinic if persistent symptoms

## 2022-08-26 ENCOUNTER — Encounter (INDEPENDENT_AMBULATORY_CARE_PROVIDER_SITE_OTHER): Payer: Self-pay | Admitting: Gastroenterology

## 2022-08-27 ENCOUNTER — Emergency Department (HOSPITAL_COMMUNITY)
Admission: EM | Admit: 2022-08-27 | Discharge: 2022-08-27 | Disposition: A | Discharge status: Home / Self Care | Payer: PPO | Attending: Emergency Medicine | Admitting: Emergency Medicine

## 2022-08-27 ENCOUNTER — Encounter (HOSPITAL_COMMUNITY): Payer: Self-pay | Admitting: *Deleted

## 2022-08-27 ENCOUNTER — Emergency Department (HOSPITAL_COMMUNITY): Payer: PPO

## 2022-08-27 ENCOUNTER — Other Ambulatory Visit: Payer: Self-pay

## 2022-08-27 DIAGNOSIS — R002 Palpitations: Secondary | ICD-10-CM | POA: Insufficient documentation

## 2022-08-27 DIAGNOSIS — R058 Other specified cough: Secondary | ICD-10-CM | POA: Insufficient documentation

## 2022-08-27 DIAGNOSIS — Z79899 Other long term (current) drug therapy: Secondary | ICD-10-CM | POA: Insufficient documentation

## 2022-08-27 DIAGNOSIS — Z1152 Encounter for screening for COVID-19: Secondary | ICD-10-CM | POA: Diagnosis not present

## 2022-08-27 DIAGNOSIS — R059 Cough, unspecified: Secondary | ICD-10-CM | POA: Diagnosis not present

## 2022-08-27 DIAGNOSIS — Z7901 Long term (current) use of anticoagulants: Secondary | ICD-10-CM | POA: Diagnosis not present

## 2022-08-27 DIAGNOSIS — I2510 Atherosclerotic heart disease of native coronary artery without angina pectoris: Secondary | ICD-10-CM | POA: Diagnosis not present

## 2022-08-27 DIAGNOSIS — R051 Acute cough: Secondary | ICD-10-CM

## 2022-08-27 DIAGNOSIS — I4891 Unspecified atrial fibrillation: Secondary | ICD-10-CM | POA: Diagnosis not present

## 2022-08-27 DIAGNOSIS — Z794 Long term (current) use of insulin: Secondary | ICD-10-CM | POA: Insufficient documentation

## 2022-08-27 DIAGNOSIS — E039 Hypothyroidism, unspecified: Secondary | ICD-10-CM | POA: Diagnosis not present

## 2022-08-27 DIAGNOSIS — R0789 Other chest pain: Secondary | ICD-10-CM | POA: Diagnosis not present

## 2022-08-27 LAB — CBC WITH DIFFERENTIAL/PLATELET
Abs Immature Granulocytes: 0.01 10*3/uL (ref 0.00–0.07)
Basophils Absolute: 0 10*3/uL (ref 0.0–0.1)
Basophils Relative: 1 %
Eosinophils Absolute: 0.1 10*3/uL (ref 0.0–0.5)
Eosinophils Relative: 1 %
HCT: 40.9 % (ref 39.0–52.0)
Hemoglobin: 14 g/dL (ref 13.0–17.0)
Immature Granulocytes: 0 %
Lymphocytes Relative: 28 %
Lymphs Abs: 1.5 10*3/uL (ref 0.7–4.0)
MCH: 31.5 pg (ref 26.0–34.0)
MCHC: 34.2 g/dL (ref 30.0–36.0)
MCV: 91.9 fL (ref 80.0–100.0)
Monocytes Absolute: 0.6 10*3/uL (ref 0.1–1.0)
Monocytes Relative: 12 %
Neutro Abs: 3.2 10*3/uL (ref 1.7–7.7)
Neutrophils Relative %: 58 %
Platelets: 148 10*3/uL — ABNORMAL LOW (ref 150–400)
RBC: 4.45 MIL/uL (ref 4.22–5.81)
RDW: 14.4 % (ref 11.5–15.5)
WBC: 5.4 10*3/uL (ref 4.0–10.5)
nRBC: 0 % (ref 0.0–0.2)

## 2022-08-27 LAB — TROPONIN I (HIGH SENSITIVITY): Troponin I (High Sensitivity): 4 ng/L (ref <18)

## 2022-08-27 LAB — BASIC METABOLIC PANEL
Anion gap: 6 (ref 5–15)
BUN: 18 mg/dL (ref 8–23)
CO2: 28 mmol/L (ref 22–32)
Calcium: 8.7 mg/dL — ABNORMAL LOW (ref 8.9–10.3)
Chloride: 106 mmol/L (ref 98–111)
Creatinine, Ser: 1.45 mg/dL — ABNORMAL HIGH (ref 0.61–1.24)
GFR, Estimated: 46 mL/min — ABNORMAL LOW (ref >60)
Glucose, Bld: 101 mg/dL — ABNORMAL HIGH (ref 70–99)
Potassium: 3.8 mmol/L (ref 3.5–5.1)
Sodium: 140 mmol/L (ref 135–145)

## 2022-08-27 LAB — RESP PANEL BY RT-PCR (FLU A&B, COVID) ARPGX2
Influenza A by PCR: NEGATIVE
Influenza B by PCR: NEGATIVE
SARS Coronavirus 2 by RT PCR: NEGATIVE

## 2022-08-27 NOTE — Discharge Instructions (Signed)
You have been seen today for your complaint of cough and chest tightness. Your lab work was reassuring and showed no abnormalities. Your imaging was reassuring and showed no abnormalities. Your discharge medications include Prilosec.  This is an over-the-counter medication.  You may take this in addition to the Pepcid to see if it helps with the cough. Follow up with: Your primary care provider in 1 week Please seek immediate medical care if you develop any of the following symptoms: You cough up blood. You have difficulty breathing. Your heartbeat is very fast. At this time there does not appear to be the presence of an emergent medical condition, however there is always the potential for conditions to change. Please read and follow the below instructions.  Do not take your medicine if  develop an itchy rash, swelling in your mouth or lips, or difficulty breathing; call 911 and seek immediate emergency medical attention if this occurs.  You may review your lab tests and imaging results in their entirety on your MyChart account.  Please discuss all results of fully with your primary care provider and other specialist at your follow-up visit.  Note: Portions of this text may have been transcribed using voice recognition software. Every effort was made to ensure accuracy; however, inadvertent computerized transcription errors may still be present.

## 2022-08-27 NOTE — ED Triage Notes (Signed)
Pt c/o non-productive cough for the last few days; pt states he does not feel well and has been having heart palpitations

## 2022-08-27 NOTE — ED Triage Notes (Signed)
Patient c/o intermittent non-productive cough since yesterday, no cough syrup taken.

## 2022-08-27 NOTE — ED Provider Notes (Signed)
[facility] EMERGENCY DEPARTMENT Provider Note   CSN: [id number] Arrival date & time: 08/27/22  8366     History  Chief Complaint  Patient presents with   Cough    [name] is a 88 y.o. male.  With history of A-fib on Coumadin, CAD, GERD, hypothyroidism, mitral regurgitation, Crohn's who presents to the ED for evaluation of a cough.  Patient states the cough began "a couple days ago."  Cough is intermittent and worse when he is lying down.  Patient saw his primary care provider for this initially and it was believed that it was secondary to GERD.  Has been taking Pepcid daily with mild relief.  Patient woke up this morning and a coughing fit that lasted a couple minutes and felt like his heart was racing after this, as well as chest pressure.  This is the reason for his visit today.  Cough is nonproductive.  Denies fevers, body aches, shortness of breath, dizziness, headedness, syncope, nasal congestion, rhinorrhea.   Cough      Home Medications Prior to Admission medications   Medication Sig Start Date End Date Taking? Authorizing Provider  acetaminophen (TYLENOL) 500 MG tablet Take 500 mg by mouth every 6 (six) hours as needed for mild pain or moderate pain.    [provider]  Adalimumab (HUMIRA) 40 MG/0.4ML PSKT Inject 40 mg into the skin every 14 (fourteen) days.    [provider]  carvedilol (COREG) 6.25 MG tablet TAKE ONE TABLET BY MOUTH TWICE DAILY 05/15/22   Satira Sark, MD  Cholecalciferol (VITAMIN D3) 50 MCG (2000 UT) TABS Take by mouth daily.    [provider]  diltiazem (CARDIZEM CD) 180 MG 24 hr capsule Take 180 mg by mouth 2 (two) times daily.    [provider]  famotidine (PEPCID) 20 MG tablet Take 1 tablet (20 mg total) by mouth 2 (two) times daily. 08/25/22   Harvel Quale, MD  levothyroxine (SYNTHROID, LEVOTHROID) 75 MCG tablet Take 75 mcg by mouth daily before breakfast.    [provider]   magnesium gluconate (MAGONATE) 500 MG tablet Take 500 mg by mouth 2 (two) times daily. 12/02/20   Rogene Houston, MD  Melatonin 12 MG TABS Take 15 mg by mouth at bedtime.    [provider]  nitroGLYCERIN (NITROSTAT) 0.4 MG SL tablet Place 0.4 mg under the tongue every 5 (five) minutes as needed. For chest pains. May repeat for up to 3 doses.    [provider]  Probiotic Product (ALIGN) 4 MG CAPS Take by mouth in the morning.    [provider]  warfarin (COUMADIN) 2.5 MG tablet TAKE 1-1 &1/2 TABLETS BY MOUTH DAILY AS DIRECTED 08/14/22   Satira Sark, MD      Allergies    Patient has no known allergies.    Review of Systems   Review of Systems  Respiratory:  Positive for cough.   Cardiovascular:  Positive for palpitations.  All other systems reviewed and are negative.   Physical Exam Updated Vital Signs BP (!) 146/91 (BP Location: Left Arm)   Pulse 71   Temp 98.5 F (36.9 C) (Oral)   Resp 18   Ht 5' 8"  (1.727 m)   Wt 68.9 kg   SpO2 100%   BMI 23.11 kg/m  Physical Exam Vitals and nursing note reviewed.  Constitutional:      General: He is not in acute distress.    Appearance: Normal appearance.  He is well-developed. He is not ill-appearing, toxic-appearing or diaphoretic.  HENT:     Head: Normocephalic and atraumatic.     Nose: No congestion or rhinorrhea.     Mouth/Throat:     Mouth: Mucous membranes are moist.     Pharynx: Oropharynx is clear. No oropharyngeal exudate or posterior oropharyngeal erythema.  Eyes:     Conjunctiva/sclera: Conjunctivae normal.  Cardiovascular:     Rate and Rhythm: Normal rate. Rhythm irregular.     Pulses: Normal pulses.     Heart sounds: No murmur heard. Pulmonary:     Effort: Pulmonary effort is normal. No respiratory distress.     Breath sounds: Normal breath sounds.  Abdominal:     Palpations: Abdomen is soft.     Tenderness: There is no abdominal tenderness.  Musculoskeletal:        General:  No swelling. Normal range of motion.     Cervical back: Normal range of motion and neck supple.     Right lower leg: No edema.     Left lower leg: No edema.  Lymphadenopathy:     Cervical: No cervical adenopathy.  Skin:    General: Skin is warm and dry.     Capillary Refill: Capillary refill takes less than 2 seconds.  Neurological:     General: No focal deficit present.     Mental Status: He is alert and oriented to person, place, and time.  Psychiatric:        Mood and Affect: Mood normal.     ED Results / Procedures / Treatments   Labs (all labs ordered are listed, but only abnormal results are displayed) Labs Reviewed  RESP PANEL BY RT-PCR (FLU A&B, COVID) [id number]  BASIC METABOLIC PANEL  CBC WITH DIFFERENTIAL/PLATELET  TROPONIN I (HIGH SENSITIVITY)    EKG None  Radiology No results found.  Procedures Procedures    Medications Ordered in ED Medications - No data to display  ED Course/ Medical Decision Making/ A&P Clinical Course as of 08/27/22 1233  Sun Aug 27, 2022  1130 DG Chest 2 View I personally reviewed the image. No acute cardiopulmonary abnormalities [AS]    Clinical Course User Index [AS] Ladene Allocca, G[place]on Folk, PA-C                           Medical Decision Making Amount and/or Complexity of Data Reviewed Labs: ordered. Radiology: ordered. Decision-making details documented in ED Course.  This patient presents to the ED for concern of cough and chest pressure, this involves an extensive number of treatment options, and is a complaint that carries with it a high risk of complications and morbidity.  Differential diagnosis for emergent cause of cough includes but is not limited to upper respiratory infection, lower respiratory infection, allergies, asthma, irritants, foreign body, medications such as ACE inhibitors, reflux, asthma, CHF, lung cancer, interstitial lung disease, psychiatric causes, postnasal drip and postinfectious bronchospasm.    Co  morbidities that complicate the patient evaluation   A-fib, CAD, GERD, hypothyroidism, mitral regurgitation, Crohn's  My initial workup includes  Additional history obtained from: Nursing notes from this visit. Previous records within EMR system telephone encounter on 08/25/2022 advised patient to continue taking famotidine for GERD as this is likely the cause of his cough  I ordered, reviewed and interpreted labs which include: BMP, CBC, troponin, respiratory panel.  Stable creatinine 1.45.  All other labs normal limits   I ordered imaging studies including  chest x-ray I independently visualized and interpreted imaging which showed normal I agree with the radiologist interpretation  Cardiac Monitoring:  The patient was maintained on a cardiac monitor.  I personally viewed and interpreted the cardiac monitored which showed an underlying rhythm of: A-fib  Afebrile, hemodynamically stable.  Patient is an 88 year old male who presents the ED for evaluation of cough.  He states he had a significant episode of coughing this morning which caused chest tightness and palpitations.  This was transient and improved shortly after began.  Patient presents to the ED for evaluation of his heart.  Physical exam unremarkable.  Lab work-up unremarkable for acute findings including troponin.  EKG shows no ischemic changes.  Chest x-ray normal.  Patient was recently seen for his cough and started on famotidine as it is believed that the cause of the cough is GERD.  I have low suspicion for bronchitis or pneumonia as a cause of the cough.  Patient is stable to follow-up as an outpatient for further management.  Gave patient return precautions.  Stable at discharge.  At this time there does not appear to be any evidence of an acute emergency medical condition and the patient appears stable for discharge with appropriate outpatient follow up. Diagnosis was discussed with patient who verbalizes understanding of care  plan and is agreeable to discharge. I have discussed return precautions with patient and wife who verbalizes understanding. Patient encouraged to follow-up with their PCP within 1 week. All questions answered.  Patient's case discussed with Dr. Mayra Neer who agrees with plan to discharge with follow-up.   Note: Portions of this report may have been transcribed using voice recognition software. Every effort was made to ensure accuracy; however, inadvertent computerized transcription errors may still be present.          Final Clinical Impression(s) / ED Diagnoses Final diagnoses:  None    Rx / DC Orders ED Discharge Orders     None         Roylene Reason, Hershal Coria 08/27/22 1237    Audley Hose, MD 08/27/22 1652    Audley Hose, MD 08/27/22 [phone]

## 2022-08-28 ENCOUNTER — Other Ambulatory Visit (INDEPENDENT_AMBULATORY_CARE_PROVIDER_SITE_OTHER): Payer: Self-pay

## 2022-08-28 ENCOUNTER — Telehealth (INDEPENDENT_AMBULATORY_CARE_PROVIDER_SITE_OTHER): Payer: Self-pay

## 2022-08-28 DIAGNOSIS — K219 Gastro-esophageal reflux disease without esophagitis: Secondary | ICD-10-CM

## 2022-08-28 MED ORDER — OMEPRAZOLE 40 MG PO CPDR: 40.0000 mg | DELAYED_RELEASE_CAPSULE | Freq: Every day | ORAL | 3 refills | Status: DC

## 2022-08-28 NOTE — Telephone Encounter (Signed)
Patient called today stating [facility] states they never received the famotidine Rx. It was available over the counter and I made the patient aware he would probably have to tell [facility] that we sent the Rx and it is otc to fill it any way. They report that yesterday the patient had a cough and a vibration in his chest, so with his history of heart issues they went to [facility] ED. She says at [facility] they had suggested that patient be started on Prilosec. They want to know which you think is the best option for the patient the famotidine bid or Prilosec. Please advise.

## 2022-08-28 NOTE — Telephone Encounter (Signed)
40 mg qday

## 2022-08-28 NOTE — Telephone Encounter (Signed)
Ok, thanks, what mg and dosage should I send in ?

## 2022-08-28 NOTE — Telephone Encounter (Signed)
I have sent the Prilosec 40 mg Qd to Pharmacy, patient made aware, and aware Prilosec would be better per Dr. Jenetta Downer.

## 2022-08-28 NOTE — Telephone Encounter (Signed)
Prilosec may be a better idea in my opinion

## 2022-09-01 DIAGNOSIS — D6869 Other thrombophilia: Secondary | ICD-10-CM | POA: Diagnosis not present

## 2022-09-01 DIAGNOSIS — D692 Other nonthrombocytopenic purpura: Secondary | ICD-10-CM | POA: Diagnosis not present

## 2022-09-01 DIAGNOSIS — I482 Chronic atrial fibrillation, unspecified: Secondary | ICD-10-CM | POA: Diagnosis not present

## 2022-09-01 DIAGNOSIS — Z7901 Long term (current) use of anticoagulants: Secondary | ICD-10-CM | POA: Diagnosis not present

## 2022-09-05 ENCOUNTER — Telehealth: Payer: Self-pay | Admitting: Cardiology

## 2022-09-05 DIAGNOSIS — Z6823 Body mass index (BMI) 23.0-23.9, adult: Secondary | ICD-10-CM | POA: Diagnosis not present

## 2022-09-05 DIAGNOSIS — M5430 Sciatica, unspecified side: Secondary | ICD-10-CM | POA: Diagnosis not present

## 2022-09-05 NOTE — Telephone Encounter (Signed)
Called and spoke to patient.  Dr Quintin Alto started him on prednisone 85m tablets (3 tabs x 5 days, 2 tabs x 5 days, 1 tab x 5 days) for his hip and back pain. (11/21 - 12/5).  Told pt to decrease warfarin to 1 tablet daily except 1/2 tablet on Wednesdays and Saturdays until INR appt on 09/14/22.  He verbalized understanding.

## 2022-09-05 NOTE — Telephone Encounter (Signed)
Pt c/o medication issue:  1. Name of Medication:  Prednisone  2. How are you currently taking this medication (dosage and times per day)?   3. Are you having a reaction (difficulty breathing--STAT)?   4. What is your medication issue?   Patient states his PCP recently prescribed Prednisone and he would like to make sure that this will not interfere with his Coumadin. Please advise.

## 2022-09-14 ENCOUNTER — Ambulatory Visit: Payer: PPO | Attending: Cardiology | Admitting: *Deleted

## 2022-09-14 DIAGNOSIS — Z5181 Encounter for therapeutic drug level monitoring: Secondary | ICD-10-CM | POA: Diagnosis not present

## 2022-09-14 DIAGNOSIS — I4891 Unspecified atrial fibrillation: Secondary | ICD-10-CM

## 2022-09-14 LAB — POCT INR: INR: 3.5 — AB (ref 2.0–3.0)

## 2022-09-14 NOTE — Patient Instructions (Signed)
Hold warfarin tonight then decrease dose to 1 tablet daily  Recheck in 4 weeks

## 2022-09-19 DIAGNOSIS — S0990XA Unspecified injury of head, initial encounter: Secondary | ICD-10-CM | POA: Diagnosis not present

## 2022-09-19 DIAGNOSIS — M47812 Spondylosis without myelopathy or radiculopathy, cervical region: Secondary | ICD-10-CM | POA: Diagnosis not present

## 2022-09-19 DIAGNOSIS — S199XXA Unspecified injury of neck, initial encounter: Secondary | ICD-10-CM | POA: Diagnosis not present

## 2022-09-19 DIAGNOSIS — S0083XA Contusion of other part of head, initial encounter: Secondary | ICD-10-CM | POA: Diagnosis not present

## 2022-09-19 DIAGNOSIS — S0993XA Unspecified injury of face, initial encounter: Secondary | ICD-10-CM | POA: Diagnosis not present

## 2022-09-19 DIAGNOSIS — Z79899 Other long term (current) drug therapy: Secondary | ICD-10-CM | POA: Diagnosis not present

## 2022-09-19 DIAGNOSIS — S41111A Laceration without foreign body of right upper arm, initial encounter: Secondary | ICD-10-CM | POA: Diagnosis not present

## 2022-09-19 DIAGNOSIS — N400 Enlarged prostate without lower urinary tract symptoms: Secondary | ICD-10-CM | POA: Diagnosis not present

## 2022-09-19 DIAGNOSIS — I10 Essential (primary) hypertension: Secondary | ICD-10-CM | POA: Diagnosis not present

## 2022-09-19 DIAGNOSIS — W0110XA Fall on same level from slipping, tripping and stumbling with subsequent striking against unspecified object, initial encounter: Secondary | ICD-10-CM | POA: Diagnosis not present

## 2022-09-19 DIAGNOSIS — I6782 Cerebral ischemia: Secondary | ICD-10-CM | POA: Diagnosis not present

## 2022-09-19 DIAGNOSIS — Z7901 Long term (current) use of anticoagulants: Secondary | ICD-10-CM | POA: Diagnosis not present

## 2022-09-20 ENCOUNTER — Telehealth (INDEPENDENT_AMBULATORY_CARE_PROVIDER_SITE_OTHER): Payer: Self-pay

## 2022-09-20 NOTE — Telephone Encounter (Signed)
Patient called wanting to know if you need to add anything to the labs he will have for Dr. Quintin Alto on 09/25/2022. Dr. Quintin Alto has ordered CMET, Lipid panel, Tsh, Magnesium, CBC and Vit D. Please advised.

## 2022-09-20 NOTE — Telephone Encounter (Signed)
Patient made aware.

## 2022-09-20 NOTE — Telephone Encounter (Signed)
No need for any other labs Thanks

## 2022-09-21 DIAGNOSIS — T07XXXA Unspecified multiple injuries, initial encounter: Secondary | ICD-10-CM | POA: Diagnosis not present

## 2022-09-21 DIAGNOSIS — G4700 Insomnia, unspecified: Secondary | ICD-10-CM | POA: Diagnosis not present

## 2022-09-21 DIAGNOSIS — Z6822 Body mass index (BMI) 22.0-22.9, adult: Secondary | ICD-10-CM | POA: Diagnosis not present

## 2022-09-21 DIAGNOSIS — W108XXD Fall (on) (from) other stairs and steps, subsequent encounter: Secondary | ICD-10-CM | POA: Diagnosis not present

## 2022-09-25 DIAGNOSIS — N189 Chronic kidney disease, unspecified: Secondary | ICD-10-CM | POA: Diagnosis not present

## 2022-09-25 DIAGNOSIS — R7301 Impaired fasting glucose: Secondary | ICD-10-CM | POA: Diagnosis not present

## 2022-09-25 DIAGNOSIS — I10 Essential (primary) hypertension: Secondary | ICD-10-CM | POA: Diagnosis not present

## 2022-09-25 DIAGNOSIS — E7849 Other hyperlipidemia: Secondary | ICD-10-CM | POA: Diagnosis not present

## 2022-09-25 DIAGNOSIS — K2100 Gastro-esophageal reflux disease with esophagitis, without bleeding: Secondary | ICD-10-CM | POA: Diagnosis not present

## 2022-09-25 DIAGNOSIS — E7800 Pure hypercholesterolemia, unspecified: Secondary | ICD-10-CM | POA: Diagnosis not present

## 2022-09-25 DIAGNOSIS — E782 Mixed hyperlipidemia: Secondary | ICD-10-CM | POA: Diagnosis not present

## 2022-09-25 DIAGNOSIS — E559 Vitamin D deficiency, unspecified: Secondary | ICD-10-CM | POA: Diagnosis not present

## 2022-09-25 DIAGNOSIS — E7801 Familial hypercholesterolemia: Secondary | ICD-10-CM | POA: Diagnosis not present

## 2022-09-25 DIAGNOSIS — D509 Iron deficiency anemia, unspecified: Secondary | ICD-10-CM | POA: Diagnosis not present

## 2022-09-25 DIAGNOSIS — E039 Hypothyroidism, unspecified: Secondary | ICD-10-CM | POA: Diagnosis not present

## 2022-09-26 ENCOUNTER — Ambulatory Visit: Payer: PPO | Attending: Cardiology | Admitting: *Deleted

## 2022-09-26 DIAGNOSIS — I4891 Unspecified atrial fibrillation: Secondary | ICD-10-CM | POA: Diagnosis not present

## 2022-09-26 DIAGNOSIS — Z5181 Encounter for therapeutic drug level monitoring: Secondary | ICD-10-CM

## 2022-09-26 LAB — POCT INR: INR: 3.8 — AB (ref 2.0–3.0)

## 2022-09-26 NOTE — Patient Instructions (Signed)
Hold warfarin tonight then decrease dose to 1 tablet daily except 1/2 tablet on Wednesdays Recheck in 3 weeks

## 2022-09-28 ENCOUNTER — Other Ambulatory Visit: Payer: Self-pay

## 2022-09-28 ENCOUNTER — Emergency Department (HOSPITAL_COMMUNITY): Payer: PPO

## 2022-09-28 ENCOUNTER — Inpatient Hospital Stay (HOSPITAL_COMMUNITY)
Admission: EM | Admit: 2022-09-28 | Discharge: 2022-10-10 | DRG: 177 | Disposition: A | Discharge status: Skilled Nursing Facility | Payer: PPO | Attending: Internal Medicine | Admitting: Internal Medicine

## 2022-09-28 ENCOUNTER — Ambulatory Visit (INDEPENDENT_AMBULATORY_CARE_PROVIDER_SITE_OTHER): Payer: PPO | Admitting: Gastroenterology

## 2022-09-28 ENCOUNTER — Encounter (HOSPITAL_COMMUNITY): Payer: Self-pay | Admitting: Emergency Medicine

## 2022-09-28 DIAGNOSIS — D6832 Hemorrhagic disorder due to extrinsic circulating anticoagulants: Secondary | ICD-10-CM | POA: Diagnosis present

## 2022-09-28 DIAGNOSIS — Z1152 Encounter for screening for COVID-19: Secondary | ICD-10-CM | POA: Diagnosis not present

## 2022-09-28 DIAGNOSIS — R140 Abdominal distension (gaseous): Secondary | ICD-10-CM | POA: Diagnosis not present

## 2022-09-28 DIAGNOSIS — M47814 Spondylosis without myelopathy or radiculopathy, thoracic region: Secondary | ICD-10-CM | POA: Diagnosis not present

## 2022-09-28 DIAGNOSIS — Z79891 Long term (current) use of opiate analgesic: Secondary | ICD-10-CM

## 2022-09-28 DIAGNOSIS — Z79890 Hormone replacement therapy (postmenopausal): Secondary | ICD-10-CM | POA: Diagnosis not present

## 2022-09-28 DIAGNOSIS — K317 Polyp of stomach and duodenum: Secondary | ICD-10-CM | POA: Diagnosis not present

## 2022-09-28 DIAGNOSIS — D649 Anemia, unspecified: Secondary | ICD-10-CM | POA: Diagnosis not present

## 2022-09-28 DIAGNOSIS — Z9079 Acquired absence of other genital organ(s): Secondary | ICD-10-CM

## 2022-09-28 DIAGNOSIS — I4891 Unspecified atrial fibrillation: Secondary | ICD-10-CM | POA: Diagnosis not present

## 2022-09-28 DIAGNOSIS — D132 Benign neoplasm of duodenum: Secondary | ICD-10-CM

## 2022-09-28 DIAGNOSIS — I5032 Chronic diastolic (congestive) heart failure: Secondary | ICD-10-CM | POA: Diagnosis present

## 2022-09-28 DIAGNOSIS — N400 Enlarged prostate without lower urinary tract symptoms: Secondary | ICD-10-CM | POA: Diagnosis present

## 2022-09-28 DIAGNOSIS — R1110 Vomiting, unspecified: Secondary | ICD-10-CM | POA: Diagnosis not present

## 2022-09-28 DIAGNOSIS — Z79899 Other long term (current) drug therapy: Secondary | ICD-10-CM

## 2022-09-28 DIAGNOSIS — R112 Nausea with vomiting, unspecified: Secondary | ICD-10-CM | POA: Diagnosis not present

## 2022-09-28 DIAGNOSIS — F05 Delirium due to known physiological condition: Secondary | ICD-10-CM | POA: Diagnosis not present

## 2022-09-28 DIAGNOSIS — J188 Other pneumonia, unspecified organism: Secondary | ICD-10-CM | POA: Diagnosis present

## 2022-09-28 DIAGNOSIS — C7A1 Malignant poorly differentiated neuroendocrine tumors: Secondary | ICD-10-CM | POA: Diagnosis not present

## 2022-09-28 DIAGNOSIS — K920 Hematemesis: Secondary | ICD-10-CM

## 2022-09-28 DIAGNOSIS — R443 Hallucinations, unspecified: Secondary | ICD-10-CM | POA: Diagnosis not present

## 2022-09-28 DIAGNOSIS — K209 Esophagitis, unspecified without bleeding: Secondary | ICD-10-CM | POA: Diagnosis not present

## 2022-09-28 DIAGNOSIS — K3189 Other diseases of stomach and duodenum: Secondary | ICD-10-CM | POA: Diagnosis not present

## 2022-09-28 DIAGNOSIS — H9190 Unspecified hearing loss, unspecified ear: Secondary | ICD-10-CM | POA: Diagnosis present

## 2022-09-28 DIAGNOSIS — K2081 Other esophagitis with bleeding: Secondary | ICD-10-CM | POA: Diagnosis not present

## 2022-09-28 DIAGNOSIS — I2510 Atherosclerotic heart disease of native coronary artery without angina pectoris: Secondary | ICD-10-CM | POA: Diagnosis present

## 2022-09-28 DIAGNOSIS — K2101 Gastro-esophageal reflux disease with esophagitis, with bleeding: Secondary | ICD-10-CM | POA: Diagnosis not present

## 2022-09-28 DIAGNOSIS — J189 Pneumonia, unspecified organism: Secondary | ICD-10-CM | POA: Diagnosis not present

## 2022-09-28 DIAGNOSIS — Z9049 Acquired absence of other specified parts of digestive tract: Secondary | ICD-10-CM

## 2022-09-28 DIAGNOSIS — J9811 Atelectasis: Secondary | ICD-10-CM | POA: Diagnosis not present

## 2022-09-28 DIAGNOSIS — R918 Other nonspecific abnormal finding of lung field: Secondary | ICD-10-CM | POA: Diagnosis not present

## 2022-09-28 DIAGNOSIS — K5090 Crohn's disease, unspecified, without complications: Secondary | ICD-10-CM | POA: Diagnosis not present

## 2022-09-28 DIAGNOSIS — K5000 Crohn's disease of small intestine without complications: Secondary | ICD-10-CM | POA: Diagnosis not present

## 2022-09-28 DIAGNOSIS — D62 Acute posthemorrhagic anemia: Secondary | ICD-10-CM | POA: Diagnosis not present

## 2022-09-28 DIAGNOSIS — J9601 Acute respiratory failure with hypoxia: Secondary | ICD-10-CM | POA: Diagnosis present

## 2022-09-28 DIAGNOSIS — K50012 Crohn's disease of small intestine with intestinal obstruction: Secondary | ICD-10-CM | POA: Diagnosis not present

## 2022-09-28 DIAGNOSIS — Z8249 Family history of ischemic heart disease and other diseases of the circulatory system: Secondary | ICD-10-CM

## 2022-09-28 DIAGNOSIS — E039 Hypothyroidism, unspecified: Secondary | ICD-10-CM | POA: Diagnosis present

## 2022-09-28 DIAGNOSIS — Z7901 Long term (current) use of anticoagulants: Secondary | ICD-10-CM

## 2022-09-28 DIAGNOSIS — R2689 Other abnormalities of gait and mobility: Secondary | ICD-10-CM | POA: Diagnosis not present

## 2022-09-28 DIAGNOSIS — J690 Pneumonitis due to inhalation of food and vomit: Secondary | ICD-10-CM | POA: Diagnosis present

## 2022-09-28 DIAGNOSIS — J984 Other disorders of lung: Secondary | ICD-10-CM | POA: Diagnosis not present

## 2022-09-28 DIAGNOSIS — I700 Atherosclerosis of aorta: Secondary | ICD-10-CM | POA: Diagnosis not present

## 2022-09-28 DIAGNOSIS — R0602 Shortness of breath: Secondary | ICD-10-CM | POA: Diagnosis not present

## 2022-09-28 DIAGNOSIS — R41841 Cognitive communication deficit: Secondary | ICD-10-CM | POA: Diagnosis not present

## 2022-09-28 DIAGNOSIS — K6389 Other specified diseases of intestine: Secondary | ICD-10-CM | POA: Diagnosis not present

## 2022-09-28 DIAGNOSIS — R451 Restlessness and agitation: Secondary | ICD-10-CM | POA: Diagnosis not present

## 2022-09-28 DIAGNOSIS — Z4682 Encounter for fitting and adjustment of non-vascular catheter: Secondary | ICD-10-CM | POA: Diagnosis not present

## 2022-09-28 DIAGNOSIS — R0600 Dyspnea, unspecified: Secondary | ICD-10-CM | POA: Diagnosis not present

## 2022-09-28 DIAGNOSIS — Z751 Person awaiting admission to adequate facility elsewhere: Secondary | ICD-10-CM

## 2022-09-28 DIAGNOSIS — J90 Pleural effusion, not elsewhere classified: Secondary | ICD-10-CM | POA: Diagnosis not present

## 2022-09-28 DIAGNOSIS — Z961 Presence of intraocular lens: Secondary | ICD-10-CM | POA: Diagnosis present

## 2022-09-28 DIAGNOSIS — A419 Sepsis, unspecified organism: Principal | ICD-10-CM | POA: Diagnosis present

## 2022-09-28 DIAGNOSIS — K208 Other esophagitis without bleeding: Secondary | ICD-10-CM | POA: Diagnosis not present

## 2022-09-28 DIAGNOSIS — M6281 Muscle weakness (generalized): Secondary | ICD-10-CM | POA: Diagnosis not present

## 2022-09-28 LAB — CBC WITH DIFFERENTIAL/PLATELET
Abs Immature Granulocytes: 0.06 10*3/uL (ref 0.00–0.07)
Basophils Absolute: 0.1 10*3/uL (ref 0.0–0.1)
Basophils Relative: 0 %
Eosinophils Absolute: 0.1 10*3/uL (ref 0.0–0.5)
Eosinophils Relative: 1 %
HCT: 43.7 % (ref 39.0–52.0)
Hemoglobin: 14.6 g/dL (ref 13.0–17.0)
Immature Granulocytes: 1 %
Lymphocytes Relative: 11 %
Lymphs Abs: 1.2 10*3/uL (ref 0.7–4.0)
MCH: 30.9 pg (ref 26.0–34.0)
MCHC: 33.4 g/dL (ref 30.0–36.0)
MCV: 92.4 fL (ref 80.0–100.0)
Monocytes Absolute: 0.6 10*3/uL (ref 0.1–1.0)
Monocytes Relative: 5 %
Neutro Abs: 9.2 10*3/uL — ABNORMAL HIGH (ref 1.7–7.7)
Neutrophils Relative %: 82 %
Platelets: 159 10*3/uL (ref 150–400)
RBC: 4.73 MIL/uL (ref 4.22–5.81)
RDW: 14.6 % (ref 11.5–15.5)
WBC: 11.2 10*3/uL — ABNORMAL HIGH (ref 4.0–10.5)
nRBC: 0 % (ref 0.0–0.2)

## 2022-09-28 LAB — URINALYSIS, ROUTINE W REFLEX MICROSCOPIC
Bacteria, UA: NONE SEEN
Bilirubin Urine: NEGATIVE
Glucose, UA: NEGATIVE mg/dL
Ketones, ur: 5 mg/dL — AB
Leukocytes,Ua: NEGATIVE
Nitrite: NEGATIVE
Protein, ur: NEGATIVE mg/dL
Specific Gravity, Urine: 1.015 (ref 1.005–1.030)
pH: 6 (ref 5.0–8.0)

## 2022-09-28 LAB — COMPREHENSIVE METABOLIC PANEL
ALT: 36 U/L (ref 0–44)
AST: 37 U/L (ref 15–41)
Albumin: 3.6 g/dL (ref 3.5–5.0)
Alkaline Phosphatase: 94 U/L (ref 38–126)
Anion gap: 8 (ref 5–15)
BUN: 18 mg/dL (ref 8–23)
CO2: 28 mmol/L (ref 22–32)
Calcium: 9.1 mg/dL (ref 8.9–10.3)
Chloride: 101 mmol/L (ref 98–111)
Creatinine, Ser: 1.49 mg/dL — ABNORMAL HIGH (ref 0.61–1.24)
GFR, Estimated: 45 mL/min — ABNORMAL LOW (ref >60)
Glucose, Bld: 132 mg/dL — ABNORMAL HIGH (ref 70–99)
Potassium: 4.2 mmol/L (ref 3.5–5.1)
Sodium: 137 mmol/L (ref 135–145)
Total Bilirubin: 1.3 mg/dL — ABNORMAL HIGH (ref 0.3–1.2)
Total Protein: 6.9 g/dL (ref 6.5–8.1)

## 2022-09-28 LAB — LIPASE, BLOOD: Lipase: 37 U/L (ref 11–51)

## 2022-09-28 LAB — RESP PANEL BY RT-PCR (RSV, FLU A&B, COVID)  RVPGX2
Influenza A by PCR: NEGATIVE
Influenza B by PCR: NEGATIVE
Resp Syncytial Virus by PCR: NEGATIVE
SARS Coronavirus 2 by RT PCR: NEGATIVE

## 2022-09-28 LAB — PROTIME-INR
INR: 1.7 — ABNORMAL HIGH (ref 0.8–1.2)
Prothrombin Time: 19.8 seconds — ABNORMAL HIGH (ref 11.4–15.2)

## 2022-09-28 LAB — TROPONIN I (HIGH SENSITIVITY)
Troponin I (High Sensitivity): 4 ng/L (ref <18)
Troponin I (High Sensitivity): 4 ng/L (ref <18)

## 2022-09-28 LAB — LACTIC ACID, PLASMA: Lactic Acid, Venous: 1.5 mmol/L (ref 0.5–1.9)

## 2022-09-28 MED ORDER — SODIUM CHLORIDE 0.9 % IV SOLN
2.0000 g | Freq: Once | INTRAVENOUS | Status: AC
  Administered 2022-09-28: 2 g via INTRAVENOUS
  Filled 2022-09-28: qty 20

## 2022-09-28 MED ORDER — ONDANSETRON 4 MG PO TBDP
4.0000 mg | ORAL_TABLET | Freq: Once | ORAL | Status: AC
  Administered 2022-09-28: 4 mg via ORAL
  Filled 2022-09-28: qty 1

## 2022-09-28 MED ORDER — CARVEDILOL 3.125 MG PO TABS
6.2500 mg | ORAL_TABLET | Freq: Two times a day (BID) | ORAL | Status: DC
  Administered 2022-09-28 – 2022-10-01 (×7): 6.25 mg via ORAL
  Filled 2022-09-28 (×7): qty 2

## 2022-09-28 MED ORDER — CHLORHEXIDINE GLUCONATE CLOTH 2 % EX PADS
6.0000 | MEDICATED_PAD | Freq: Every day | CUTANEOUS | Status: DC
  Administered 2022-09-28 – 2022-10-08 (×12): 6 via TOPICAL

## 2022-09-28 MED ORDER — LEVOTHYROXINE SODIUM 75 MCG PO TABS
75.0000 ug | ORAL_TABLET | Freq: Every day | ORAL | Status: DC
  Administered 2022-09-30 – 2022-10-01 (×2): 75 ug via ORAL
  Filled 2022-09-28 (×2): qty 1

## 2022-09-28 MED ORDER — ACETAMINOPHEN 325 MG PO TABS: 650.0000 mg | ORAL_TABLET | Freq: Four times a day (QID) | ORAL | Status: DC | PRN

## 2022-09-28 MED ORDER — SODIUM CHLORIDE 0.9 % IV SOLN
2.0000 g | INTRAVENOUS | Status: AC
  Administered 2022-09-29 – 2022-10-02 (×4): 2 g via INTRAVENOUS
  Filled 2022-09-28 (×4): qty 20

## 2022-09-28 MED ORDER — TRAZODONE HCL 50 MG PO TABS
50.0000 mg | ORAL_TABLET | Freq: Every day | ORAL | Status: DC
  Administered 2022-09-28 – 2022-10-01 (×4): 50 mg via ORAL
  Filled 2022-09-28 (×4): qty 1

## 2022-09-28 MED ORDER — ONDANSETRON HCL 4 MG PO TABS: 4.0000 mg | ORAL_TABLET | Freq: Four times a day (QID) | ORAL | Status: DC | PRN

## 2022-09-28 MED ORDER — WARFARIN - PHARMACIST DOSING INPATIENT: Freq: Every day | Status: DC

## 2022-09-28 MED ORDER — DILTIAZEM HCL ER COATED BEADS 180 MG PO CP24
180.0000 mg | ORAL_CAPSULE | Freq: Two times a day (BID) | ORAL | Status: DC
  Administered 2022-09-28 – 2022-09-29 (×3): 180 mg via ORAL
  Filled 2022-09-28 (×3): qty 1

## 2022-09-28 MED ORDER — SODIUM CHLORIDE 0.9 % IV BOLUS
1000.0000 mL | Freq: Once | INTRAVENOUS | Status: AC
  Administered 2022-09-28: 1000 mL via INTRAVENOUS

## 2022-09-28 MED ORDER — POLYETHYLENE GLYCOL 3350 17 G PO PACK
17.0000 g | PACK | Freq: Every day | ORAL | Status: DC | PRN
  Administered 2022-09-29: 17 g via ORAL
  Filled 2022-09-28: qty 1

## 2022-09-28 MED ORDER — DILTIAZEM HCL-DEXTROSE 125-5 MG/125ML-% IV SOLN (PREMIX)
5.0000 mg/h | INTRAVENOUS | Status: DC
  Administered 2022-09-28: 5 mg/h via INTRAVENOUS
  Administered 2022-09-29 (×2): 15 mg/h via INTRAVENOUS
  Administered 2022-09-30: 7.5 mg/h via INTRAVENOUS
  Administered 2022-09-30: 15 mg/h via INTRAVENOUS
  Filled 2022-09-28 (×8): qty 125

## 2022-09-28 MED ORDER — ONDANSETRON HCL 4 MG/2ML IJ SOLN
4.0000 mg | Freq: Four times a day (QID) | INTRAMUSCULAR | Status: DC | PRN
  Administered 2022-09-28 – 2022-09-29 (×3): 4 mg via INTRAVENOUS
  Filled 2022-09-28 (×3): qty 2

## 2022-09-28 MED ORDER — MORPHINE SULFATE (PF) 4 MG/ML IV SOLN: 4.0000 mg | Freq: Once | INTRAVENOUS | Status: DC

## 2022-09-28 MED ORDER — SODIUM CHLORIDE 0.9 % IV SOLN: INTRAVENOUS | Status: AC

## 2022-09-28 MED ORDER — SODIUM CHLORIDE 0.9 % IV SOLN
500.0000 mg | Freq: Once | INTRAVENOUS | Status: AC
  Administered 2022-09-28: 500 mg via INTRAVENOUS
  Filled 2022-09-28: qty 5

## 2022-09-28 MED ORDER — SODIUM CHLORIDE 0.9 % IV BOLUS: 250.0000 mL | Freq: Once | INTRAVENOUS | Status: DC

## 2022-09-28 MED ORDER — DILTIAZEM HCL 25 MG/5ML IV SOLN
10.0000 mg | Freq: Once | INTRAVENOUS | Status: AC
  Administered 2022-09-28: 10 mg via INTRAVENOUS
  Filled 2022-09-28: qty 5

## 2022-09-28 MED ORDER — WARFARIN SODIUM 2.5 MG PO TABS
2.5000 mg | ORAL_TABLET | Freq: Once | ORAL | Status: AC
  Administered 2022-09-28: 2.5 mg via ORAL
  Filled 2022-09-28: qty 1

## 2022-09-28 MED ORDER — GUAIFENESIN-DM 100-10 MG/5ML PO SYRP
10.0000 mL | ORAL_SOLUTION | Freq: Three times a day (TID) | ORAL | Status: AC
  Administered 2022-09-28 – 2022-09-29 (×4): 10 mL via ORAL
  Filled 2022-09-28 (×4): qty 10

## 2022-09-28 MED ORDER — IOHEXOL 350 MG/ML SOLN
75.0000 mL | Freq: Once | INTRAVENOUS | Status: AC | PRN
  Administered 2022-09-28: 75 mL via INTRAVENOUS

## 2022-09-28 MED ORDER — ACETAMINOPHEN 650 MG RE SUPP: 650.0000 mg | Freq: Four times a day (QID) | RECTAL | Status: DC | PRN

## 2022-09-28 MED ORDER — SODIUM CHLORIDE 0.9 % IV SOLN: 500.0000 mg | INTRAVENOUS | Status: DC

## 2022-09-28 MED ORDER — ALBUTEROL SULFATE (2.5 MG/3ML) 0.083% IN NEBU
2.5000 mg | INHALATION_SOLUTION | Freq: Once | RESPIRATORY_TRACT | Status: AC
  Administered 2022-09-28: 2.5 mg via RESPIRATORY_TRACT
  Filled 2022-09-28: qty 3

## 2022-09-28 NOTE — Assessment & Plan Note (Signed)
Follows with Dr. Domenic Polite.  Mild/moderate nonobstructive disease 2004.  No chest pain.  EKG unchanged.

## 2022-09-28 NOTE — Assessment & Plan Note (Signed)
Stable.  On Humira.

## 2022-09-28 NOTE — ED Provider Notes (Signed)
[facility] Provider Note   CSN: [id number] Arrival date & time: 09/28/22  0725     History  Chief Complaint  Patient presents with   Cough    [name] is a 88 y.o. male. With past medical history of CAD, GERD, atrial fibrillation anticoagulated on Coumadin, Crohn's disease, BPH who presents to the emergency department with cough.  States he has had cough for 2 weeks. States that is has been non-productive the entire time. Wife at bedside states that last Thursday the patient was seen by PCP and told his lungs were clear. The recommend he take Mucinex DM, which he has been using without relief of symptoms. The wife notes that since that appointment his cough has become "deeper." Patient states beginning this morning at 4AM he began having nausea and vomiting. Has had 6 total episodes of non-bloody emesis this morning. He endorses feeling lightheaded. Denies chest pain, palpitations, abdominal pain, diarrhea, fevers.    Cough Associated symptoms: no chest pain, no chills, no fever and no shortness of breath        Home Medications Prior to Admission medications   Medication Sig Start Date End Date Taking? Authorizing Provider  acetaminophen (TYLENOL) 500 MG tablet Take 500 mg by mouth every 6 (six) hours as needed for mild pain or moderate pain.   Yes [provider]  carvedilol (COREG) 6.25 MG tablet TAKE ONE TABLET BY MOUTH TWICE DAILY 05/15/22  Yes Satira Sark, MD  Cholecalciferol (VITAMIN D3) 50 MCG (2000 UT) TABS Take 1 tablet by mouth daily.   Yes [provider]  cyclobenzaprine (FLEXERIL) 10 MG tablet Take 10 mg by mouth at bedtime.   Yes [provider]  dextromethorphan-guaiFENesin (MUCINEX DM) 30-600 MG 12hr tablet Take 1 tablet by mouth 2 (two) times daily.   Yes [provider]  diltiazem (CARDIZEM CD) 180 MG 24 hr capsule Take 180 mg by mouth 2 (two) times daily.   Yes [provider]   levothyroxine (SYNTHROID, LEVOTHROID) 75 MCG tablet Take 75 mcg by mouth daily before breakfast.   Yes [provider]  magnesium gluconate (MAGONATE) 500 MG tablet Take 500 mg by mouth 2 (two) times daily. 12/02/20  Yes Rehman, Mechele Dawley, MD  Melatonin 12 MG TABS Take 15 mg by mouth at bedtime.   Yes [provider]  nitroGLYCERIN (NITROSTAT) 0.4 MG SL tablet Place 0.4 mg under the tongue every 5 (five) minutes as needed. For chest pains. May repeat for up to 3 doses.   Yes [provider]  omeprazole (PRILOSEC) 40 MG capsule Take 1 capsule (40 mg total) by mouth daily. 08/28/22  Yes Harvel Quale, MD  Probiotic Product (ALIGN) 4 MG CAPS Take by mouth in the morning.   Yes [provider]  traZODone (DESYREL) 50 MG tablet Take 50 mg by mouth at bedtime.   Yes [provider]  warfarin (COUMADIN) 2.5 MG tablet TAKE 1-1 &1/2 TABLETS BY MOUTH DAILY AS DIRECTED 08/14/22  Yes Satira Sark, MD  Adalimumab (HUMIRA) 40 MG/0.4ML PSKT Inject 40 mg into the skin every 14 (fourteen) days.    [provider]      Allergies    Patient has no known allergies.    Review of Systems   Review of Systems  Constitutional:  Negative for chills and fever.  Respiratory:  Positive for cough. Negative for shortness of breath.   Cardiovascular:  Negative for chest pain.  Gastrointestinal:  Positive for nausea and vomiting. Negative for abdominal pain and diarrhea.  All other systems reviewed and are negative.   Physical Exam Updated Vital Signs BP (!) 141/85   Pulse (!) 146   Temp 97.8 F (36.6 C) (Oral)   Resp (!) 24   Ht 5' 8"  (1.727 m)   Wt 68.9 kg   SpO2 98%   BMI 23.11 kg/m  Physical Exam Vitals and nursing note reviewed.  Constitutional:      Appearance: Normal appearance. He is ill-appearing.  HENT:     Head: Normocephalic.     Nose: Nose normal.     Mouth/Throat:     Mouth: Mucous membranes are dry.     Pharynx:  Oropharynx is clear.  Eyes:     General: No scleral icterus.    Extraocular Movements: Extraocular movements intact.     Pupils: Pupils are equal, round, and reactive to light.  Cardiovascular:     Rate and Rhythm: Tachycardia present. Rhythm irregular.     Heart sounds: No murmur heard. Pulmonary:     Effort: Tachypnea present. No respiratory distress.     Breath sounds: Decreased air movement present. Examination of the left-upper field reveals wheezing. Examination of the right-middle field reveals decreased breath sounds. Examination of the right-lower field reveals rhonchi and rales. Examination of the left-lower field reveals decreased breath sounds and rhonchi. Decreased breath sounds, wheezing, rhonchi and rales present.  Abdominal:     General: Bowel sounds are normal.     Palpations: Abdomen is soft.     Tenderness: There is no abdominal tenderness.  Musculoskeletal:        General: Normal range of motion.     Cervical back: Neck supple.     Right lower leg: No edema.     Left lower leg: No edema.  Skin:    General: Skin is warm and dry.     Capillary Refill: Capillary refill takes less than 2 seconds.  Neurological:     General: No focal deficit present.     Mental Status: He is alert and oriented to person, place, and time. Mental status is at baseline.  Psychiatric:        Mood and Affect: Mood normal.        Behavior: Behavior normal.        Thought Content: Thought content normal.        Judgment: Judgment normal.     ED Results / Procedures / Treatments   Labs (all labs ordered are listed, but only abnormal results are displayed) Labs Reviewed  COMPREHENSIVE METABOLIC PANEL - Abnormal; Notable for the following components:      Result Value   Glucose, Bld 132 (*)    Creatinine, Ser 1.49 (*)    Total Bilirubin 1.3 (*)    GFR, Estimated 45 (*)    All other components within normal limits  CBC WITH DIFFERENTIAL/PLATELET - Abnormal; Notable for the following  components:   WBC 11.2 (*)    Neutro Abs 9.2 (*)    All other components within normal limits  URINALYSIS, ROUTINE W REFLEX MICROSCOPIC - Abnormal; Notable for the following components:   Hgb urine dipstick SMALL (*)    Ketones, ur 5 (*)    All other components within normal limits  RESP PANEL BY RT-PCR (RSV, FLU A&B, COVID)  RVPGX2  LIPASE, BLOOD  TROPONIN I (HIGH SENSITIVITY)  TROPONIN I (HIGH SENSITIVITY)    EKG None  Radiology CT Angio Chest PE  W/Cm &/Or Wo Cm  Result Date: 09/28/2022 CLINICAL DATA:  Two-week history of cough with acute onset nausea/vomiting EXAM: CT ANGIOGRAPHY CHEST WITH CONTRAST TECHNIQUE: Multidetector CT imaging of the chest was performed using the standard protocol during bolus administration of intravenous contrast. Multiplanar CT image reconstructions and MIPs were obtained to evaluate the vascular anatomy. RADIATION DOSE REDUCTION: This exam was performed according to the departmental dose-optimization program which includes automated exposure control, adjustment of the mA and/or kV according to patient size and/or use of iterative reconstruction technique. CONTRAST:  20m OMNIPAQUE IOHEXOL 350 MG/ML SOLN COMPARISON:  Chest radiograph dated 09/28/2022, CT abdomen and pelvis dated 04/11/2022 FINDINGS: Cardiovascular: The study is high quality for the evaluation of pulmonary embolism. There are no filling defects in the central, lobar, segmental or subsegmental pulmonary artery branches to suggest acute pulmonary embolism. Great vessels are normal in course and caliber. Left atrial enlargement. No significant pericardial fluid/thickening. Coronary artery calcifications and aortic atherosclerosis. Mediastinum/Nodes: Thyroid gland without nodules meeting criteria for imaging follow-up by size. Patulous esophagus containing layering secretions. No pathologically enlarged axillary, supraclavicular, mediastinal, or hilar lymph nodes. Lungs/Pleura: The central airways are  patent. Trace layering secretions within the trachea. Mild bronchial wall thickening. Ground-glass densities and consolidation involving the lower lobes bilaterally. Subsegmental atelectasis of the lingula and middle lobe with additional scattered ground-glass nodules in the right upper lobe. No pneumothorax. No pleural effusion. Upper abdomen: Subcentimeter hypodensities in the right hepatic lobe (5:92), too small to characterize but unchanged from prior examinations. Cholecystectomy. 2.2 cm peripherally calcified medial splenic cyst is again seen (5:90). Findings of prior bowel resection in the right upper quadrant. Musculoskeletal: No acute or abnormal lytic or blastic osseous lesions. Review of the MIP images confirms the above findings. IMPRESSION: 1. No evidence of pulmonary embolism. 2. Ground-glass densities and consolidation involving the lower lobes bilaterally with additional scattered ground-glass nodules in the right upper lobe. Findings are suspicious for multifocal infection/aspiration. 3. Coronary artery calcifications. Aortic Atherosclerosis (ICD10-I70.0). Electronically Signed   By: LDarrin NipperM.D.   On: 09/28/2022 14:30   DG Chest 2 View  Result Date: 09/28/2022 CLINICAL DATA:  Dyspnea, nausea, vomiting EXAM: CHEST - 2 VIEW COMPARISON:  08/27/2022 FINDINGS: Normal heart size, mediastinal contours, and pulmonary vascularity. Minimal bibasilar atelectasis. Lungs otherwise clear. No pulmonary infiltrate, pleural effusion, or pneumothorax. Bowel interposition between liver and diaphragm. No osseous abnormalities. IMPRESSION: Minimal bibasilar atelectasis. Electronically Signed   By: MLavonia DanaM.D.   On: 09/28/2022 09:30    Procedures .Critical Care  Performed by: AMickie Hillier PA-C Authorized by: AMickie Hillier PA-C   Critical care provider statement:    Critical care time (minutes):  40   Critical care time was exclusive of:  Separately billable procedures and treating other  patients   Critical care was necessary to treat or prevent imminent or life-threatening deterioration of the following conditions:  Cardiac failure and respiratory failure   Critical care was time spent personally by me on the following activities:  Development of treatment plan with patient or surrogate, discussions with consultants, discussions with primary provider, evaluation of patient's response to treatment, examination of patient, interpretation of cardiac output measurements, obtaining history from patient or surrogate, review of old charts, re-evaluation of patient's condition, pulse oximetry, ordering and review of radiographic studies, ordering and review of laboratory studies and ordering and performing treatments and interventions   I assumed direction of critical care for this patient from another provider in my specialty: no  Care discussed with: admitting provider      Medications Ordered in ED Medications  diltiazem (CARDIZEM) 125 mg in dextrose 5% 125 mL (1 mg/mL) infusion (10 mg/hr Intravenous Rate/Dose Change 09/28/22 1432)  cefTRIAXone (ROCEPHIN) 2 g in sodium chloride 0.9 % 100 mL IVPB (has no administration in time range)  azithromycin (ZITHROMAX) 500 mg in sodium chloride 0.9 % 250 mL IVPB (has no administration in time range)  ondansetron (ZOFRAN-ODT) disintegrating tablet 4 mg (4 mg Oral Given 09/28/22 1110)  sodium chloride 0.9 % bolus 1,000 mL (0 mLs Intravenous Stopped 09/28/22 1253)  diltiazem (CARDIZEM) injection 10 mg (10 mg Intravenous Given 09/28/22 1111)  iohexol (OMNIPAQUE) 350 MG/ML injection 75 mL (75 mLs Intravenous Contrast Given 09/28/22 1406)    ED Course/ Medical Decision Making/ A&P                           Medical Decision Making Amount and/or Complexity of Data Reviewed Labs: ordered. Radiology: ordered. ECG/medicine tests: ordered.  Risk Prescription drug management. Decision regarding hospitalization.  Initial Impression and  Ddx 88 year old male who presents to the emergency department with cough x2 weeks and now having N/V. He is ill appearing on exam. Appears mildly tachypneic. Lungs sound rhonchorous. His O2 mildly decreased to around 93%. Notably, appears to be in afib RVR. Maintaining BP. He complains of feeling lightheaded. Unsure if this is from the N/V or RVR. Denies palpitations or chest pain. In addition to the respiratory work up, will obtain troponin, give 1L IVF and 19m diltiazem push and assess rate. Will likely need CT chest if his CXR shows no pneumonia based on his physical exam.  Patient PMH that increases complexity of ED encounter:  afib, CAD, Crohn's  Differential: viral URI with cough, med side effect, CHF, pneumonia, PE, pleural effusion, PTX. RVR from acute illness? Med non-compliance, meds ineffective, ACS, etc.   Interpretation of Diagnostics I independent reviewed and interpreted the labs as followed: COVID and flu negative, only mild leukocytosis to 11.2, CMP with stable creatinine, no severe electrolyte derangements.  Lipase is negative  - I independently visualized the following imaging with scope of interpretation limited to determining acute life threatening conditions related to emergency care: Chest x-ray, which revealed no acute findings.  CT angio PE study with no PE but does have multifocal pneumonia.  Patient Reassessment and Ultimate Disposition/Management 1210: Reassessed the patient after diltiazem push with no improvement in his RVR.  Will start him on a diltiazem drip.  Will also place him on 2 L he is mildly hypoxic to 91% and continues to have cough and difficulty taking deep breaths.  His chest x-ray does not demonstrate pneumonia, but he has adventitious lung sounds.  Will order CT PE study to evaluate for PE versus occult pneumonia not seen on plain films.  Will also admit him given the above.  Patient does have multifocal pneumonia on CTA PE study.  There is no PE evident.   He is oxygenating better on 2 L nasal cannula of near 96%.  After findings of multifocal pneumonia, placed on Rocephin and azithromycin for CAP coverage.  He is on 10 mg/hour cardizem drip with continued RVR. I have instructed nursing to increase to 15.   Consulted with hospitalist Dr. EDenton Brickwho agree to admit for CAP and Afib RVR. His work up is without findings of ACS, PE. CXR without PTX, pleural effusion. No evidence of CHF.    Patient management  required discussion with the following services or consulting groups:  Hospitalist Service  Complexity of Problems Addressed Acute illness or injury that poses threat of life of bodily function  Additional Data Reviewed and Analyzed Further history obtained from: Further history from spouse/family member, Past medical history and medications listed in the EMR, Prior ED visit notes, Recent PCP notes, Care Everywhere, and Prior labs/imaging results  Patient Encounter Risk Assessment SDOH impact on management and Consideration of hospitalization  Final Clinical Impression(s) / ED Diagnoses Final diagnoses:  Community acquired pneumonia, unspecified laterality  Atrial fibrillation with RVR [facility])    Rx / DC Orders ED Discharge Orders     None         Mickie Hillier, PA-C 09/28/22 1532    Kommor, Debe Coder, MD 09/28/22 1824

## 2022-09-28 NOTE — Progress Notes (Signed)
Patient received from ED, A&Ox4, no acute distress noticed, report given to the night nurse

## 2022-09-28 NOTE — H&P (Signed)
History and Physical    [name] [id number] DOB: 1931-05-06 DOA: 09/28/2022  PCP: Manon Hilding, MD   Patient coming from: Home  I have personally briefly reviewed patient's old medical records in [place]  Chief Complaint: Cough, Vomiting  HPI: [name] is a 88 y.o. male with medical history significant for Atria Fibrillation, Crohn's, diastolic CHF, CAD. Patient presented to the ED with complaints of worsening cough over the past 2 weeks, this morning he had at least 5 episodes of vomiting was lightheaded and came to the ED.  Reports earlier into his symptoms, he had a chest x-ray done by his outpatient provider and it did not show a chest x-ray, he was prescribed Mucinex.  He denies difficulty breathing.  No chest pain.  No leg swelling.  He denies palpitations.  ED Course: Tmax 98.3.  Heart rates recorded as ranging from 40-166.  Respiratory rate 17-37.  Blood pressure systolic ranging widely from  116 - 230s.  O2 sats down to 91% on room air, placed on 2 L. WBC 11.2.  Troponin 4.  Chest x-ray shows minimal bibasilar atelectasis.  CTA chest-showing findings of multifocal infection/aspiration. 1 L bolus IV ceftriaxone and azithromycin started.  Review of Systems: As per HPI all other systems reviewed and negative.  Past Medical History:  Diagnosis Date   Anemia    Atrial fibrillation (HCC)    BPH (benign prostatic hyperplasia)    CAD (coronary artery disease)    Catheterization 2004, mild/moderate nonobstructive disease  /   nuclear, 2007, small inferior scar // no ischemia   Crohn's disease (HCC)    Elevated PSA    GERD (gastroesophageal reflux disease)    History of kidney stones    History of pneumonia    Hypothyroidism    Mitral regurgitation    SBO (small bowel obstruction) ([place])    Urinary retention    Wears glasses     Past Surgical History:  Procedure Laterality Date   AGILE CAPSULE  10/18/2011   Procedure: AGILE CAPSULE;  Surgeon: Rogene Houston, MD;  [facility];  Service: Endoscopy;  Laterality: N/A;  [place]  2004   CATARACT EXTRACTION W/PHACO Right 06/27/2021   Procedure: CATARACT EXTRACTION PHACO AND INTRAOCULAR LENS PLACEMENT (IOC);  Surgeon: Baruch Goldmann, MD;  Location: [facility];  Service: Ophthalmology;  Laterality: Right;  CDE 21.98   CATARACT EXTRACTION W/PHACO Left 07/11/2021   Procedure: CATARACT EXTRACTION PHACO AND INTRAOCULAR LENS PLACEMENT LEFT EYE;  Surgeon: Baruch Goldmann, MD;  Location: [facility];  Service: Ophthalmology;  Laterality: Left;  CDE=10.42   CHOLECYSTECTOMY  2010   Dr. Anthony Sar   COLONOSCOPY  2008   [facility]   COLONOSCOPY N/A 03/23/2016   Procedure: COLONOSCOPY;  Surgeon: Rogene Houston, MD;  [facility];  Service: Endoscopy;  Laterality: N/A;  1:00   CYSTOSCOPY WITH INSERTION OF UROLIFT     ESOPHAGOGASTRODUODENOSCOPY  11/08/2011   Procedure: ESOPHAGOGASTRODUODENOSCOPY (EGD);  Surgeon: Rogene Houston, MD;  [facility];  Service: Endoscopy;  Laterality: N/A;  300   POLYPECTOMY  03/23/2016   Procedure: POLYPECTOMY;  Surgeon: Rogene Houston, MD;  [facility];  Service: Endoscopy;;  Cecal polyp removed via cold forceps recto-sigmoid polyp removed via cold snare   TRANSURETHRAL RESECTION OF PROSTATE N/A 04/29/2020   Procedure: TRANSURETHRAL RESECTION OF THE PROSTATE (TURP);  Surgeon: Franchot Gallo, MD;  Location: [facility];  Service:  Urology;  Laterality: N/A;  90 MINS     reports that he has never smoked. He has never been exposed to tobacco smoke. He has never used smokeless tobacco. He reports that he does not drink alcohol and does not use drugs.  No Known Allergies  Family History  Problem Relation Age of Onset   Colon cancer Mother    Heart disease Father    Stroke Brother    Healthy Daughter     Prior to Admission medications   Medication Sig Start Date End Date Taking? Authorizing Provider   acetaminophen (TYLENOL) 500 MG tablet Take 500 mg by mouth every 6 (six) hours as needed for mild pain or moderate pain.   Yes [provider]  carvedilol (COREG) 6.25 MG tablet TAKE ONE TABLET BY MOUTH TWICE DAILY 05/15/22  Yes Satira Sark, MD  Cholecalciferol (VITAMIN D3) 50 MCG (2000 UT) TABS Take 1 tablet by mouth daily.   Yes [provider]  cyclobenzaprine (FLEXERIL) 10 MG tablet Take 10 mg by mouth at bedtime.   Yes [provider]  dextromethorphan-guaiFENesin (MUCINEX DM) 30-600 MG 12hr tablet Take 1 tablet by mouth 2 (two) times daily.   Yes [provider]  diltiazem (CARDIZEM CD) 180 MG 24 hr capsule Take 180 mg by mouth 2 (two) times daily.   Yes [provider]  levothyroxine (SYNTHROID, LEVOTHROID) 75 MCG tablet Take 75 mcg by mouth daily before breakfast.   Yes [provider]  magnesium gluconate (MAGONATE) 500 MG tablet Take 500 mg by mouth 2 (two) times daily. 12/02/20  Yes Rehman, Mechele Dawley, MD  Melatonin 12 MG TABS Take 15 mg by mouth at bedtime.   Yes [provider]  nitroGLYCERIN (NITROSTAT) 0.4 MG SL tablet Place 0.4 mg under the tongue every 5 (five) minutes as needed. For chest pains. May repeat for up to 3 doses.   Yes [provider]  omeprazole (PRILOSEC) 40 MG capsule Take 1 capsule (40 mg total) by mouth daily. 08/28/22  Yes Harvel Quale, MD  Probiotic Product (ALIGN) 4 MG CAPS Take by mouth in the morning.   Yes [provider]  traZODone (DESYREL) 50 MG tablet Take 50 mg by mouth at bedtime.   Yes [provider]  warfarin (COUMADIN) 2.5 MG tablet TAKE 1-1 &1/2 TABLETS BY MOUTH DAILY AS DIRECTED 08/14/22  Yes Satira Sark, MD  Adalimumab (HUMIRA) 40 MG/0.4ML PSKT Inject 40 mg into the skin every 14 (fourteen) days.    [provider]    Physical Exam: Vitals:   09/28/22 1400 09/28/22 1430 09/28/22 1445 09/28/22 1500  BP: (!) 141/97 (!)  120/108 139/83 (!) 141/85  Pulse: 96 (!) 147 (!) 126 (!) 146  Resp: (!) 29 (!) 27 (!) 34 (!) 24  Temp:      TempSrc:      SpO2: 92% 96% 97% 98%  Weight:      Height:        Constitutional: NAD, calm, comfortable Vitals:   09/28/22 1400 09/28/22 1430 09/28/22 1445 09/28/22 1500  BP: (!) 141/97 (!) 120/108 139/83 (!) 141/85  Pulse: 96 (!) 147 (!) 126 (!) 146  Resp: (!) 29 (!) 27 (!) 34 (!) 24  Temp:      TempSrc:      SpO2: 92% 96% 97% 98%  Weight:      Height:       Eyes: PERRL, lids and conjunctivae normal ENMT: Mucous membranes are moist.  Neck: normal, supple, no masses, no thyromegaly Respiratory: clear to auscultation bilaterally, no wheezing, no crackles. Normal respiratory effort. No accessory muscle use.  Cardiovascular: Regular rate and rhythm, no murmurs / rubs / gallops. No extremity edema.  Extremities warm Abdomen: no tenderness, no masses palpated. No hepatosplenomegaly. Bowel sounds positive.  Musculoskeletal: no clubbing / cyanosis. No joint deformity upper and lower extremities.  Skin: Bruise to right forearm and right side of face just below eye, from fall about a week and a half ago, went to [place], CT head was negative for bleed.  No rashes,, ulcers. No induration Neurologic: No apparent cranial nerve abnormality, moving extremities spontaneously. Psychiatric: Normal judgment and insight. Alert and oriented x 3. Normal mood.   Labs on Admission: I have personally reviewed following labs and imaging studies  CBC: Recent Labs  Lab 09/28/22 0906  WBC 11.2*  NEUTROABS 9.2*  HGB 14.6  HCT 43.7  MCV 92.4  PLT 235   Basic Metabolic Panel: Recent Labs  Lab 09/28/22 0906  NA 137  K 4.2  CL 101  CO2 28  GLUCOSE 132*  BUN 18  CREATININE 1.49*  CALCIUM 9.1   GFR: Estimated Creatinine Clearance: 32.5 mL/min (A) (by C-G formula based on SCr of 1.49 mg/dL (H)). Liver Function Tests: Recent Labs  Lab 09/28/22 0906  AST 37  ALT 36  ALKPHOS 94   BILITOT 1.3*  PROT 6.9  ALBUMIN 3.6   Recent Labs  Lab 09/28/22 0906  LIPASE 37   No results for input(s): "AMMONIA" in the last 168 hours. Coagulation Profile: Recent Labs  Lab 09/26/22 1150  INR 3.8*   Urine analysis:    Component Value Date/Time   COLORURINE YELLOW 09/28/2022 [place] 09/28/2022 1239   APPEARANCEUR Clear 03/16/2021 1450   LABSPEC 1.015 09/28/2022 1239   PHURINE 6.0 09/28/2022 1239   GLUCOSEU NEGATIVE 09/28/2022 1239   HGBUR SMALL (A) 09/28/2022 1239   BILIRUBINUR NEGATIVE 09/28/2022 1239   BILIRUBINUR Negative 03/16/2021 1450   KETONESUR 5 (A) 09/28/2022 1239   PROTEINUR NEGATIVE 09/28/2022 1239   UROBILINOGEN negative (A) 03/23/2020 1506   NITRITE NEGATIVE 09/28/2022 1239   LEUKOCYTESUR NEGATIVE 09/28/2022 1239    Radiological Exams on Admission: CT Angio Chest PE W/Cm &/Or Wo Cm  Result Date: 09/28/2022 CLINICAL DATA:  Two-week history of cough with acute onset nausea/vomiting EXAM: CT ANGIOGRAPHY CHEST WITH CONTRAST TECHNIQUE: Multidetector CT imaging of the chest was performed using the standard protocol during bolus administration of intravenous contrast. Multiplanar CT image reconstructions and MIPs were obtained to evaluate the vascular anatomy. RADIATION DOSE REDUCTION: This exam was performed according to the departmental dose-optimization program which includes automated exposure control, adjustment of the mA and/or kV according to patient size and/or use of iterative reconstruction technique. CONTRAST:  6m OMNIPAQUE IOHEXOL 350 MG/ML SOLN COMPARISON:  Chest radiograph dated 09/28/2022, CT abdomen and pelvis dated 04/11/2022 FINDINGS: Cardiovascular: The study is high quality for the evaluation of pulmonary embolism. There are no filling defects in the central, lobar, segmental or subsegmental pulmonary artery branches to suggest acute pulmonary embolism. Great vessels are normal in course and caliber. Left atrial enlargement.  No significant pericardial fluid/thickening. Coronary artery calcifications and aortic atherosclerosis. Mediastinum/Nodes: Thyroid gland without nodules meeting criteria for imaging follow-up by size. Patulous esophagus containing layering secretions. No pathologically enlarged axillary, supraclavicular, mediastinal, or hilar lymph nodes. Lungs/Pleura: The central airways are patent. Trace layering secretions within the trachea. Mild bronchial wall thickening. Ground-glass densities and  consolidation involving the lower lobes bilaterally. Subsegmental atelectasis of the lingula and middle lobe with additional scattered ground-glass nodules in the right upper lobe. No pneumothorax. No pleural effusion. Upper abdomen: Subcentimeter hypodensities in the right hepatic lobe (5:92), too small to characterize but unchanged from prior examinations. Cholecystectomy. 2.2 cm peripherally calcified medial splenic cyst is again seen (5:90). Findings of prior bowel resection in the right upper quadrant. Musculoskeletal: No acute or abnormal lytic or blastic osseous lesions. Review of the MIP images confirms the above findings. IMPRESSION: 1. No evidence of pulmonary embolism. 2. Ground-glass densities and consolidation involving the lower lobes bilaterally with additional scattered ground-glass nodules in the right upper lobe. Findings are suspicious for multifocal infection/aspiration. 3. Coronary artery calcifications. Aortic Atherosclerosis (ICD10-I70.0). Electronically Signed   By: Darrin Nipper M.D.   On: 09/28/2022 14:30   DG Chest 2 View  Result Date: 09/28/2022 CLINICAL DATA:  Dyspnea, nausea, vomiting EXAM: CHEST - 2 VIEW COMPARISON:  08/27/2022 FINDINGS: Normal heart size, mediastinal contours, and pulmonary vascularity. Minimal bibasilar atelectasis. Lungs otherwise clear. No pulmonary infiltrate, pleural effusion, or pneumothorax. Bowel interposition between liver and diaphragm. No osseous abnormalities. IMPRESSION:  Minimal bibasilar atelectasis. Electronically Signed   By: Lavonia Dana M.D.   On: 09/28/2022 09:30    EKG: Independently reviewed.  Atrial fibrillation, rate 133, QTc 404.  PVCs.  Assessment/Plan Principal Problem:   Multifocal pneumonia Active Problems:   Sepsis ([place])   Atrial fibrillation with RVR (HCC)   CAD (coronary artery disease)   Warfarin anticoagulation   Crohn's disease ([place])   Chronic diastolic heart failure (HCC)   Assessment and Plan: * Multifocal pneumonia Worsening cough for 2 weeks, vomiting today.  WBC 11.2.  Meeting sepsis criteria with tachycardia heart rate up to 166, tachypnea respirate rate up to 37.  O2 sats down to 91% on room air.  Currently on 2 L.  CTA chest-multifocal pneumonia/aspiration -IV ceftriaxone and azithromycin -Check lactic acid -Blood cultures -Speech therapy evaluation - 1 l bolus given continue N/s 75cc/hr x 12hrs  Atrial fibrillation with RVR (HCC) Heart rates up to 166.  History of atrial fibrillation on chronic anticoagulation with warfarin. -10 mg IV Cardizem bolus, and Cardizem drip started in ED -PT/INR -Resume warfarin pharmacy to dose -Resume home Cardizem and Carvedilol  Chronic diastolic heart failure (HCC) Stable and compensated.  Not on diuretics.  Last echo 2021 EF 55%.  Crohn's disease ([place]) Stable.  On Humira.  CAD (coronary artery disease) Follows with Dr. Domenic Polite.  Mild/moderate nonobstructive disease 2004.  No chest pain.  EKG unchanged.   DVT prophylaxis: Warfarin Code Status: Full-confirmed with patient at bedside. Family Communication: None at bedside Disposition Plan: ~ 2 days Consults called: None Admission status:  Inpt Stepdown I certify that at the point of admission it is my clinical judgment that the patient will require inpatient hospital care spanning beyond 2 midnights from the point of admission due to high intensity of service, high risk for further deterioration and high frequency of  surveillance required.   Author: Bethena Roys, MD 09/28/2022 4:43 PM  For on call review [email or web address].

## 2022-09-28 NOTE — ED Notes (Signed)
Attempted to call report and did not get an answer

## 2022-09-28 NOTE — ED Triage Notes (Signed)
Pt reports cough x 2 weeks, reports was seen by PCP x 1 week ago, was given Mucinex, pt reports no improvement and began having n/v x 5 episodes since 0400 this am, denies fever

## 2022-09-28 NOTE — Assessment & Plan Note (Addendum)
Worsening cough for 2 weeks, vomiting today.  WBC 11.2.  Meeting sepsis criteria with tachycardia heart rate up to 166, tachypnea respirate rate up to 37.  O2 sats down to 91% on room air.  Currently on 2 L.  CTA chest-multifocal pneumonia/aspiration -IV ceftriaxone and azithromycin -Check lactic acid -Blood cultures -Speech therapy evaluation - 1 l bolus given continue N/s 75cc/hr x 12hrs

## 2022-09-28 NOTE — Assessment & Plan Note (Signed)
Stable and compensated.  Not on diuretics.  Last echo 2021 EF 55%.

## 2022-09-28 NOTE — Assessment & Plan Note (Addendum)
Heart rates up to 166.  History of atrial fibrillation on chronic anticoagulation with warfarin. -10 mg IV Cardizem bolus, and Cardizem drip started in ED -PT/INR -Resume warfarin pharmacy to dose -Resume home Cardizem and Carvedilol

## 2022-09-28 NOTE — Plan of Care (Signed)
  Problem: Education: Goal: Knowledge of General Education information will improve Description: Including pain rating scale, medication(s)/side effects and non-pharmacologic comfort measures Outcome: Progressing   Problem: Health Behavior/Discharge Planning: Goal: Ability to manage health-related needs will improve Outcome: Progressing   Problem: Clinical Measurements: Goal: Respiratory complications will improve Outcome: Progressing   Problem: Activity: Goal: Risk for activity intolerance will decrease Outcome: Progressing   Problem: Nutrition: Goal: Adequate nutrition will be maintained Outcome: Progressing   Problem: Respiratory: Goal: Ability to maintain adequate ventilation will improve Outcome: Progressing Goal: Ability to maintain a clear airway will improve Outcome: Progressing

## 2022-09-28 NOTE — Progress Notes (Signed)
[place] for warfarin Indication: atrial fibrillation   Assessment: 39 yom with hx afib on warfarin PTA presenting with cough, vomiting, initiated on ceftriaxone/azithromycin for PNA. Pharmacy consulted to dose warfarin inpatient. INR 1.7 on presentation. Noted DDI with azithromycin - may cause INR to increase. CBC wnl. No bleed issues reported.  PTA warfarin dose: 2.90m daily except 1.277mon Wed per last outpatient anticoagulation clinic note 09/26/22 (last dose 12/13 PTA)  Goal of Therapy:  INR 2-3 Monitor platelets by anticoagulation protocol: Yes   Plan:  Warfarin 2.70m56mO x 1 dose tonight Daily INR Monitor CBC, s/sx bleeding Watch DDI with azithromycin closely   HalArturo MortonharmD, BCPS Clinical Pharmacist 09/28/2022 5:37 PM

## 2022-09-29 ENCOUNTER — Inpatient Hospital Stay (HOSPITAL_COMMUNITY): Payer: PPO

## 2022-09-29 DIAGNOSIS — J189 Pneumonia, unspecified organism: Secondary | ICD-10-CM | POA: Diagnosis not present

## 2022-09-29 LAB — CBC
HCT: 38.6 % — ABNORMAL LOW (ref 39.0–52.0)
Hemoglobin: 13.2 g/dL (ref 13.0–17.0)
MCH: 31.3 pg (ref 26.0–34.0)
MCHC: 34.2 g/dL (ref 30.0–36.0)
MCV: 91.5 fL (ref 80.0–100.0)
Platelets: 153 10*3/uL (ref 150–400)
RBC: 4.22 MIL/uL (ref 4.22–5.81)
RDW: 14.7 % (ref 11.5–15.5)
WBC: 13.4 10*3/uL — ABNORMAL HIGH (ref 4.0–10.5)
nRBC: 0 % (ref 0.0–0.2)

## 2022-09-29 LAB — BASIC METABOLIC PANEL
Anion gap: 9 (ref 5–15)
BUN: 19 mg/dL (ref 8–23)
CO2: 25 mmol/L (ref 22–32)
Calcium: 8.3 mg/dL — ABNORMAL LOW (ref 8.9–10.3)
Chloride: 102 mmol/L (ref 98–111)
Creatinine, Ser: 1.31 mg/dL — ABNORMAL HIGH (ref 0.61–1.24)
GFR, Estimated: 52 mL/min — ABNORMAL LOW (ref >60)
Glucose, Bld: 129 mg/dL — ABNORMAL HIGH (ref 70–99)
Potassium: 3.5 mmol/L (ref 3.5–5.1)
Sodium: 136 mmol/L (ref 135–145)

## 2022-09-29 LAB — MAGNESIUM: Magnesium: 1.4 mg/dL — ABNORMAL LOW (ref 1.7–2.4)

## 2022-09-29 LAB — PROTIME-INR
INR: 1.7 — ABNORMAL HIGH (ref 0.8–1.2)
Prothrombin Time: 19.5 seconds — ABNORMAL HIGH (ref 11.4–15.2)

## 2022-09-29 LAB — MRSA NEXT GEN BY PCR, NASAL: MRSA by PCR Next Gen: NOT DETECTED

## 2022-09-29 MED ORDER — SODIUM CHLORIDE 0.9 % IV SOLN
500.0000 mg | INTRAVENOUS | Status: AC
  Administered 2022-09-29 – 2022-10-02 (×4): 500 mg via INTRAVENOUS
  Filled 2022-09-29 (×4): qty 5

## 2022-09-29 MED ORDER — PROCHLORPERAZINE 25 MG RE SUPP
25.0000 mg | Freq: Once | RECTAL | Status: AC
  Administered 2022-09-29: 25 mg via RECTAL
  Filled 2022-09-29: qty 1

## 2022-09-29 MED ORDER — WARFARIN SODIUM 2.5 MG PO TABS
2.5000 mg | ORAL_TABLET | Freq: Once | ORAL | Status: AC
  Administered 2022-09-29: 2.5 mg via ORAL
  Filled 2022-09-29: qty 1

## 2022-09-29 MED ORDER — MAGNESIUM SULFATE 4 GM/100ML IV SOLN
4.0000 g | Freq: Once | INTRAVENOUS | Status: AC
  Administered 2022-09-29: 4 g via INTRAVENOUS
  Filled 2022-09-29: qty 100

## 2022-09-29 MED ORDER — POTASSIUM CHLORIDE CRYS ER 20 MEQ PO TBCR
40.0000 meq | EXTENDED_RELEASE_TABLET | Freq: Once | ORAL | Status: AC
  Administered 2022-09-29: 40 meq via ORAL
  Filled 2022-09-29: qty 2

## 2022-09-29 MED ORDER — SODIUM CHLORIDE 0.9 % IV SOLN: INTRAVENOUS | Status: AC

## 2022-09-29 NOTE — Plan of Care (Signed)
  Problem: Education: Goal: Knowledge of General Education information will improve Description: Including pain rating scale, medication(s)/side effects and non-pharmacologic comfort measures Outcome: Progressing   Problem: Health Behavior/Discharge Planning: Goal: Ability to manage health-related needs will improve Outcome: Progressing   Problem: Nutrition: Goal: Adequate nutrition will be maintained Outcome: Progressing   Problem: Coping: Goal: Level of anxiety will decrease Outcome: Progressing   Problem: Elimination: Goal: Will not experience complications related to bowel motility Outcome: Progressing Goal: Will not experience complications related to urinary retention Outcome: Progressing   Problem: Safety: Goal: Ability to remain free from injury will improve Outcome: Progressing   Problem: Respiratory: Goal: Ability to maintain adequate ventilation will improve Outcome: Progressing Goal: Ability to maintain a clear airway will improve Outcome: Progressing

## 2022-09-29 NOTE — Care Management Important Message (Signed)
Important Message  Patient Details  Name: [name] MRN: [id number] Date of Birth: September 13, 1931   Medicare Important Message Given:  N/A - LOS <3 / Initial given by admissions     Tommy Medal 09/29/2022, 8:51 AM

## 2022-09-29 NOTE — Evaluation (Signed)
Clinical/Bedside Swallow Evaluation Patient Details  Name: [name] MRN: [id number] Date of Birth: [birth year]  Today's Date: 09/29/2022 Time: SLP Start Time (ACUTE ONLY): 1027 SLP Stop Time (ACUTE ONLY): 1612 SLP Time Calculation (min) (ACUTE ONLY): 27 min  Past Medical History:  Past Medical History:  Diagnosis Date   Anemia    Atrial fibrillation (HCC)    BPH (benign prostatic hyperplasia)    CAD (coronary artery disease)    Catheterization 2004, mild/moderate nonobstructive disease  /   nuclear, 2007, small inferior scar // no ischemia   Crohn's disease (HCC)    Elevated PSA    GERD (gastroesophageal reflux disease)    History of kidney stones    History of pneumonia    Hypothyroidism    Mitral regurgitation    SBO (small bowel obstruction) ([place])    Urinary retention    Wears glasses    Past Surgical History:  Past Surgical History:  Procedure Laterality Date   AGILE CAPSULE  10/18/2011   Procedure: AGILE CAPSULE;  Surgeon: Rogene Houston, MD;  [facility];  Service: Endoscopy;  Laterality: N/A;  [place]  2004   CATARACT EXTRACTION W/PHACO Right 06/27/2021   Procedure: CATARACT EXTRACTION PHACO AND INTRAOCULAR LENS PLACEMENT (IOC);  Surgeon: Baruch Goldmann, MD;  Location[facility];  Service: Ophthalmology;  Laterality: Right;  CDE 21.98   CATARACT EXTRACTION W/PHACO Left 07/11/2021   Procedure: CATARACT EXTRACTION PHACO AND INTRAOCULAR LENS PLACEMENT LEFT EYE;  Surgeon: Baruch Goldmann, MD;  Location: [facility];  Service: Ophthalmology;  Laterality: Left;  CDE=10.42   CHOLECYSTECTOMY  2010   Dr. Anthony Sar   COLONOSCOPY  2008   [facility]   COLONOSCOPY N/A 03/23/2016   Procedure: COLONOSCOPY;  Surgeon: Rogene Houston, MD;  [facility];  Service: Endoscopy;  Laterality: N/A;  1:00   CYSTOSCOPY WITH INSERTION OF UROLIFT     ESOPHAGOGASTRODUODENOSCOPY  11/08/2011   Procedure: ESOPHAGOGASTRODUODENOSCOPY (EGD);  Surgeon: Rogene Houston,  MD;  [facility];  Service: Endoscopy;  Laterality: N/A;  300   POLYPECTOMY  03/23/2016   Procedure: POLYPECTOMY;  Surgeon: Rogene Houston, MD;  [facility];  Service: Endoscopy;;  Cecal polyp removed via cold forceps recto-sigmoid polyp removed via cold snare   TRANSURETHRAL RESECTION OF PROSTATE N/A 04/29/2020   Procedure: TRANSURETHRAL RESECTION OF THE PROSTATE (TURP);  Surgeon: Franchot Gallo, MD;  Location: [facility];  Service: Urology;  Laterality: N/A;  36 MINS   HPI:  [name] is a 88 y.o. male with medical history significant for Atria Fibrillation, Crohn's, diastolic CHF, CAD.  Patient presented to the ED with complaints of worsening cough over the past 2 weeks, this morning he had at least 5 episodes of vomiting was lightheaded and came to the ED.Chest x-ray shows minimal bibasilar atelectasis.  CTA chest-showing findings of multifocal infection/aspiration. BSE requested    Assessment / Plan / Recommendation  Clinical Impression  Clinical swallowing evaluation completed while Pt was sitting upright in bed; Pt's oropharyngeal stage of swallowing appears to be subjectively WFL with initial swallows. Pt consumed thin liquids, puree textures and regular textures without overt s/sx of aspiration. Pt reportedly has some difficulty with mastication of dry meats. He reports his primary problem is that food is not staying down; NOTE after ~1.5 min after swallowing one bite of puree and regular and two sips of thin liquids Pt began gagging and regurgitated. Pt expectorated very dark  brown liquid into blue bag. The s/sx demosntrated and reported indicate esophageal component. Recommend GI evaluation and treatment; Reportedly this has happened with all food liquid he has consumed today. Also note recent hx of "early or partial small bowel obstruction" (04/10/22 CT). Pt is at increased risk for aspiration as he is frequently regurgitating and experiencing  esophageal issues. From an oropharyngeal standpoint recommend D3/mech soft and thin liquids. ST will f/u X1 to ensure diet tolerance. Thank you, SLP Visit Diagnosis: Dysphagia, unspecified (R13.10)    Aspiration Risk  Mild aspiration risk    Diet Recommendation Dysphagia 3 (Mech soft);Thin liquid   Liquid Administration via: Cup;Straw Medication Administration: Whole meds with liquid Supervision: Patient able to self feed Compensations: Slow rate;Small sips/bites    Other  Recommendations Recommended Consults: Consider GI evaluation;Consider esophageal assessment Oral Care Recommendations: Oral care BID    Recommendations for follow up therapy are one component of a multi-disciplinary discharge planning process, led by the attending physician.  Recommendations may be updated based on patient status, additional functional criteria and insurance authorization.  Follow up Recommendations No SLP follow up            Frequency and Duration min 1 x/week  1 week       Prognosis Prognosis for Safe Diet Advancement: Good      Swallow Study   General HPI: [name] is a 88 y.o. male with medical history significant for Atria Fibrillation, Crohn's, diastolic CHF, CAD.  Patient presented to the ED with complaints of worsening cough over the past 2 weeks, this morning he had at least 5 episodes of vomiting was lightheaded and came to the ED.Chest x-ray shows minimal bibasilar atelectasis.  CTA chest-showing findings of multifocal infection/aspiration. BSE requested Type of Study: Bedside Swallow Evaluation Previous Swallow Assessment: none in chart Diet Prior to this Study: Thin liquids;Regular Respiratory Status: Room air History of Recent Intubation: No Behavior/Cognition: Alert;Cooperative;Pleasant mood Oral Cavity Assessment: Within Functional Limits Oral Care Completed by SLP: Yes Oral Cavity - Dentition: Adequate natural dentition Vision: Functional for  self-feeding Self-Feeding Abilities: Able to feed self Patient Positioning: Upright in bed Baseline Vocal Quality: Normal Volitional Cough: Strong Volitional Swallow: Able to elicit    Oral/Motor/Sensory Function     Ice Chips Ice chips: Within functional limits   Thin Liquid Thin Liquid: Within functional limits    Nectar Thick Nectar Thick Liquid: Not tested   Honey Thick Honey Thick Liquid: Not tested   Puree Puree: Within functional limits   Solid     Solid: Within functional limits     [name], CCC-SLP Speech Language Pathologist  Wende Bushy 09/29/2022,4:12 PM

## 2022-09-29 NOTE — TOC Progression Note (Signed)
  Transition of Care [facility]) Screening Note   Patient Details  Name: [name] Date of Birth: 12/27/1930   Transition of Care [facility]) CM/SW Contact:    Boneta Lucks, RN Phone Number: 09/29/2022, 1:08 PM    Transition of Care Department [facility]) has reviewed patient and no TOC needs have been identified at this time. We will continue to monitor patient advancement through interdisciplinary progression rounds. If new patient transition needs arise, please place a TOC consult.      Barriers to Discharge: Continued Medical Work up  Expected Discharge Plan and Services         Living arrangements for the past 2 months: [place]

## 2022-09-29 NOTE — Progress Notes (Signed)
ANTICOAGULATION CONSULT NOTE -   Pharmacy Consult for warfarin Indication: atrial fibrillation  No Known Allergies  Patient Measurements: Height: 5' 8"  (172.7 cm) Weight: 69.1 kg (152 lb 5.4 oz) IBW/kg (Calculated) : 68.4 Heparin Dosing Weight:   Vital Signs: Temp: 98.5 F (36.9 C) (12/15 0413) Temp Source: Oral (12/15 0413) BP: 167/64 (12/15 0600) Pulse Rate: 102 (12/15 0600)  Labs: Recent Labs    09/26/22 1150 09/28/22 0906 09/28/22 1021 09/28/22 1235 09/28/22 1720 09/29/22 0418  HGB  --  14.6  --   --   --  13.2  HCT  --  43.7  --   --   --  38.6*  PLT  --  159  --   --   --  153  LABPROT  --   --   --   --  19.8* 19.5*  INR 3.8*  --   --   --  1.7* 1.7*  CREATININE  --  1.49*  --   --   --  1.31*  TROPONINIHS  --   --  4 4  --   --     Estimated Creatinine Clearance: 37 mL/min (A) (by C-G formula based on SCr of 1.31 mg/dL (H)).   Medical History: Past Medical History:  Diagnosis Date   Anemia    Atrial fibrillation (HCC)    BPH (benign prostatic hyperplasia)    CAD (coronary artery disease)    Catheterization 2004, mild/moderate nonobstructive disease  /   nuclear, 2007, small inferior scar // no ischemia   Crohn's disease ([place])    Elevated PSA    GERD (gastroesophageal reflux disease)    History of kidney stones    History of pneumonia    Hypothyroidism    Mitral regurgitation    SBO (small bowel obstruction) ([place])    Urinary retention    Wears glasses     Medications:  Medications Prior to Admission  Medication Sig Dispense Refill Last Dose   acetaminophen (TYLENOL) 500 MG tablet Take 500 mg by mouth every 6 (six) hours as needed for mild pain or moderate pain.   unk   carvedilol (COREG) 6.25 MG tablet TAKE ONE TABLET BY MOUTH TWICE DAILY 60 tablet 6 09/27/2022   Cholecalciferol (VITAMIN D3) 50 MCG (2000 UT) TABS Take 1 tablet by mouth daily.   09/27/2022   cyclobenzaprine (FLEXERIL) 10 MG tablet Take 10 mg by mouth at bedtime.   09/27/2022    dextromethorphan-guaiFENesin (MUCINEX DM) 30-600 MG 12hr tablet Take 1 tablet by mouth 2 (two) times daily.   09/27/2022   diltiazem (CARDIZEM CD) 180 MG 24 hr capsule Take 180 mg by mouth 2 (two) times daily.   09/27/2022   levothyroxine (SYNTHROID, LEVOTHROID) 75 MCG tablet Take 75 mcg by mouth daily before breakfast.   09/27/2022   magnesium gluconate (MAGONATE) 500 MG tablet Take 500 mg by mouth 2 (two) times daily.   09/27/2022   Melatonin 12 MG TABS Take 15 mg by mouth at bedtime.   09/27/2022   nitroGLYCERIN (NITROSTAT) 0.4 MG SL tablet Place 0.4 mg under the tongue every 5 (five) minutes as needed. For chest pains. May repeat for up to 3 doses.   unk   omeprazole (PRILOSEC) 40 MG capsule Take 1 capsule (40 mg total) by mouth daily. 90 capsule 3 09/27/2022   Probiotic Product (ALIGN) 4 MG CAPS Take by mouth in the morning.   09/27/2022   traZODone (DESYREL) 50 MG tablet Take 50 mg by  mouth at bedtime.   09/27/2022   warfarin (COUMADIN) 2.5 MG tablet TAKE 1-1 &1/2 TABLETS BY MOUTH DAILY AS DIRECTED 45 tablet 6 09/27/2022 at 0900   Adalimumab (HUMIRA) 40 MG/0.4ML PSKT Inject 40 mg into the skin every 14 (fourteen) days.       Assessment: Pharmacy consulted to dose warfarin in patient with atrial fibrillation.    PTA warfarin dose: 2.87m daily except 1.24mon Wed per last outpatient anticoagulation clinic note 09/26/22    INR 1.7 CBC WNL  Goal of Therapy:  INR 2-3 Monitor platelets by anticoagulation protocol: Yes   Plan:  Warfarin 2.5 mg x 1 dose. Monitor daily INR and s/s of bleeding.  StMargot AblesPharmD Clinical Pharmacist 09/29/2022 8:47 AM

## 2022-09-29 NOTE — Progress Notes (Signed)
PROGRESS NOTE     [name], is a 88 y.o. male, DOB - 07/28/1931, IWP:[id number]  Admit date - 09/28/2022   Admitting Physician Bethena Roys, MD  Outpatient Primary MD for the patient is [name], [name], MD  LOS - 1  Chief Complaint  Patient presents with   Cough        Brief Narrative:   88 y.o. male with medical history significant for Atria Fibrillation, Crohn's, diastolic CHF, CAD admitted on 09/28/22 with multifocal pneumonia and A-fib with RVR    -Assessment and Plan: 1)Multifocal pneumonia--- cannot rule out aspiration component Admission CTA chest-multifocal pneumonia/aspiration -c/n IV ceftriaxone and azithromycin pending culture data -Speech therapy eval appreciated recommends dysphagia 3 diet - 2)Atrial fibrillation with RVR ([place]) -Try to transition to p.o. Cardizem, for now continue IV Cardizem for rate control -Continue Coreg for rate control -Coumadin for stroke prophylaxis  3)Chronic diastolic heart failure (HCC) Stable and compensated.  Not on diuretics.  Last echo 2021 EF 55%. -Consider repeat echo after heart rate control improves  4) acute hypoxic respiratory failure--- secondary to mostly #1 and #2 above -Try to wean off oxygen -Currently requiring 2 L via nasal cannula  5)Crohn's disease ([place]) Stable.  On Humira.  6)CAD (coronary artery disease) Follows with Dr. Domenic Polite.  Mild/moderate nonobstructive disease 2004.  No chest pain.  EKG unchanged. -Continue Coreg, no aspirin as patient is already on Coumadin  7)Hypothyroidism-continue levothyroxine  8) intractable emesis----abdominal x-rays without obstructive pattern -Continue as needed antiemetics  Status is: Inpatient   Disposition: The patient is from: Home              Anticipated d/c is to: Home              Anticipated d/c date is: 3 days              Patient currently is not medically stable to d/c. Barriers: Not Clinically Stable-   Code Status :  -  Code Status: Full  Code   Family Communication:    (patient is alert, awake and coherent)  -Discussed with his wife at bedside  DVT Prophylaxis  :   - SCDs /Coumadin     Lab Results  Component Value Date   PLT 153 09/29/2022   Inpatient Medications  Scheduled Meds:  carvedilol  6.25 mg Oral BID   Chlorhexidine Gluconate Cloth  6 each Topical Daily   diltiazem  180 mg Oral BID   guaiFENesin-dextromethorphan  10 mL Oral Q8H   levothyroxine  75 mcg Oral QAC breakfast   prochlorperazine  25 mg Rectal Once   traZODone  50 mg Oral QHS   Warfarin - Pharmacist Dosing Inpatient   Does not apply q1600   Continuous Infusions:  sodium chloride 100 mL/hr at 09/29/22 0831   azithromycin 500 mg (09/29/22 1400)   cefTRIAXone (ROCEPHIN)  IV 2 g (09/29/22 1448)   diltiazem (CARDIZEM) infusion 15 mg/hr (09/29/22 1444)   PRN Meds:.acetaminophen **OR** acetaminophen, ondansetron **OR** ondansetron (ZOFRAN) IV, polyethylene glycol   Anti-infectives (From admission, onward)    Start     Dose/Rate Route Frequency Ordered Stop   09/29/22 1500  cefTRIAXone (ROCEPHIN) 2 g in sodium chloride 0.9 % 100 mL IVPB        2 g 200 mL/hr over 30 Minutes Intravenous Every 24 hours 09/28/22 1900 10/03/22 1459   09/29/22 1500  azithromycin (ZITHROMAX) 500 mg in sodium chloride 0.9 % 250 mL IVPB  Status:  Discontinued  500 mg 250 mL/hr over 60 Minutes Intravenous Every 24 hours 09/28/22 1900 09/29/22 0849   09/29/22 1500  azithromycin (ZITHROMAX) 500 mg in sodium chloride 0.9 % 250 mL IVPB        500 mg 250 mL/hr over 60 Minutes Intravenous Every 24 hours 09/29/22 0849 10/03/22 1459   09/28/22 1515  cefTRIAXone (ROCEPHIN) 2 g in sodium chloride 0.9 % 100 mL IVPB        2 g 200 mL/hr over 30 Minutes Intravenous  Once 09/28/22 1500 09/28/22 1640   09/28/22 1515  azithromycin (ZITHROMAX) 500 mg in sodium chloride 0.9 % 250 mL IVPB        500 mg 250 mL/hr over 60 Minutes Intravenous  Once 09/28/22 1500 09/28/22 1837        Subjective: [name] today has no fevers,   No chest pain,  Wife at bedside -Has cough with recurrent emesis, emesis is w/o bile or blood - Objective: Vitals:   09/29/22 0400 09/29/22 0413 09/29/22 0500 09/29/22 0600  BP: (!) 162/70  (!) 165/95 (!) 167/64  Pulse: (!) 55  (!) 34 (!) 102  Resp: 19  (!) 22 20  Temp:  98.5 F (36.9 C)    TempSrc:  Oral    SpO2: 91%  91% 93%  Weight:  69.1 kg    Height:        Intake/Output Summary (Last 24 hours) at 09/29/2022 1728 Last data filed at 09/29/2022 0651 Gross per 24 hour  Intake 1294.03 ml  Output 350 ml  Net 944.03 ml   Filed Weights   09/28/22 0840 09/29/22 0413  Weight: 68.9 kg 69.1 kg   Physical Exam Gen:- Awake Alert,  in no apparent distress  HEENT:- [place].AT, No sclera icterus Nose- [place] 2L/min Ears- HOH Neck-Supple Neck,No JVD,.  Lungs-diminished breath sounds with scattered rhonchi CV- S1, S2 normal, irregularly irregular  abd-  +ve B.Sounds, Abd Soft, No tenderness,    Extremity/Skin:- No  edema, pedal pulses present  Psych-affect is appropriate, oriented x3 Neuro-Generalized weakness, no new focal deficits, no tremors  Data Reviewed: I have personally reviewed following labs and imaging studies  CBC: Recent Labs  Lab 09/28/22 0906 09/29/22 0418  WBC 11.2* 13.4*  NEUTROABS 9.2*  --   HGB 14.6 13.2  HCT 43.7 38.6*  MCV 92.4 91.5  PLT 159 741   Basic Metabolic Panel: Recent Labs  Lab 09/28/22 0906 09/29/22 0418  NA 137 136  K 4.2 3.5  CL 101 102  CO2 28 25  GLUCOSE 132* 129*  BUN 18 19  CREATININE 1.49* 1.31*  CALCIUM 9.1 8.3*  MG  --  1.4*   GFR: Estimated Creatinine Clearance: 37 mL/min (A) (by C-G formula based on SCr of 1.31 mg/dL (H)). Liver Function Tests: Recent Labs  Lab 09/28/22 0906  AST 37  ALT 36  ALKPHOS 94  BILITOT 1.3*  PROT 6.9  ALBUMIN 3.6   Recent Results (from the past 240 hour(s))  Resp panel by RT-PCR (RSV, Flu A&B, Covid) Anterior Nasal Swab     Status:  None   Collection Time: 09/28/22  9:45 AM   Specimen: Anterior Nasal Swab  Result Value Ref Range Status   SARS Coronavirus 2 by RT PCR NEGATIVE NEGATIVE Final    Comment: (NOTE) SARS-CoV-2 target nucleic acids are NOT DETECTED.  The SARS-CoV-2 RNA is generally detectable in upper respiratory specimens during the acute phase of infection. The lowest concentration of SARS-CoV-2 viral copies this assay can  detect is 138 copies/mL. A negative result does not preclude SARS-Cov-2 infection and should not be used as the sole basis for treatment or other patient management decisions. A negative result may occur with  improper specimen collection/handling, submission of specimen other than nasopharyngeal swab, presence of viral mutation(s) within the areas targeted by this assay, and inadequate number of viral copies(<138 copies/mL). A negative result must be combined with clinical observations, patient history, and epidemiological information. The expected result is Negative.  Fact Sheet for Patients:  EntrepreneurPulse.com.au  Fact Sheet for Healthcare Providers:  IncredibleEmployment.be  This test is no t yet approved or cleared by the [place] FDA and  has been authorized for detection and/or diagnosis of SARS-CoV-2 by FDA under an Emergency Use Authorization (EUA). This EUA will remain  in effect (meaning this test can be used) for the duration of the COVID-19 declaration under Section 564(b)(1) of the Act, 21 U.S.C.section 360bbb-3(b)(1), unless the authorization is terminated  or revoked sooner.       Influenza A by PCR NEGATIVE NEGATIVE Final   Influenza B by PCR NEGATIVE NEGATIVE Final    Comment: (NOTE) The Xpert Xpress SARS-CoV-2/FLU/RSV plus assay is intended as an aid in the diagnosis of influenza from Nasopharyngeal swab specimens and should not be used as a sole basis for treatment. Nasal washings and aspirates are unacceptable  for Xpert Xpress SARS-CoV-2/FLU/RSV testing.  Fact Sheet for Patients: EntrepreneurPulse.com.au  Fact Sheet for Healthcare Providers: IncredibleEmployment.be  This test is not yet approved or cleared by the [place] FDA and has been authorized for detection and/or diagnosis of SARS-CoV-2 by FDA under an Emergency Use Authorization (EUA). This EUA will remain in effect (meaning this test can be used) for the duration of the COVID-19 declaration under Section 564(b)(1) of the Act, 21 U.S.C. section 360bbb-3(b)(1), unless the authorization is terminated or revoked.     Resp Syncytial Virus by PCR NEGATIVE NEGATIVE Final    Comment: (NOTE) Fact Sheet for Patients: EntrepreneurPulse.com.au  Fact Sheet for Healthcare Providers: IncredibleEmployment.be  This test is not yet approved or cleared by the [place] FDA and has been authorized for detection and/or diagnosis of SARS-CoV-2 by FDA under an Emergency Use Authorization (EUA). This EUA will remain in effect (meaning this test can be used) for the duration of the COVID-19 declaration under Section 564(b)(1) of the Act, 21 U.S.C. section 360bbb-3(b)(1), unless the authorization is terminated or revoked.  Performed at [facility], [address] [place], [place] [address]   Culture, blood (Routine X 2) w Reflex to ID Panel     Status: None (Preliminary result)   Collection Time: 09/28/22  5:20 PM   Specimen: BLOOD  Result Value Ref Range Status   Specimen Description BLOOD BLOOD RIGHT HAND  Final   Special Requests   Final    BOTTLES DRAWN AEROBIC AND ANAEROBIC Blood Culture adequate volume   Culture   Final    NO GROWTH < 24 HOURS Performed at [facility], [address] [place], [place] [address]    Report Status PENDING  Incomplete  Culture, blood (Routine X 2) w Reflex to ID Panel     Status: None (Preliminary result)   Collection Time:  09/28/22  5:20 PM   Specimen: BLOOD  Result Value Ref Range Status   Specimen Description BLOOD BLOOD LEFT HAND  Final   Special Requests   Final    BOTTLES DRAWN AEROBIC AND ANAEROBIC Blood Culture adequate volume   Culture  Final    NO GROWTH < 24 HOURS Performed at [facility], [address] [place], [place] [address]    Report Status PENDING  Incomplete  MRSA Next Gen by PCR, Nasal     Status: None   Collection Time: 09/28/22  7:01 PM   Specimen: Nasal Mucosa; Nasal Swab  Result Value Ref Range Status   MRSA by PCR Next Gen NOT DETECTED NOT DETECTED Final    Comment: (NOTE) The GeneXpert MRSA Assay (FDA approved for NASAL specimens only), is one component of a comprehensive MRSA colonization surveillance program. It is not intended to diagnose MRSA infection nor to guide or monitor treatment for MRSA infections. Test performance is not FDA approved in patients less than 33 years old. Performed at [facility], [address], [place], [place] [address]      Radiology Studies: DG ABD ACUTE 2+V W 1V CHEST  Result Date: 09/29/2022 CLINICAL DATA:  Dyspnea and respiratory abnormality EXAM: DG ABDOMEN ACUTE WITH 1 VIEW CHEST COMPARISON:  Radiograph 09/28/2022 FINDINGS: Unchanged cardiomediastinal silhouette. There is bibasilar airspace disease, as seen on recent CT. No large effusion. No evidence of pneumothorax. No acute osseous abnormality. Thoracic spondylosis. Distension of bowel in the upper abdomen. IMPRESSION: Bibasilar airspace disease, as seen on recent CT, consistent with multifocal pneumonia/aspiration. Electronically Signed   By: Maurine Simmering M.D.   On: 09/29/2022 10:04   CT Angio Chest PE W/Cm &/Or Wo Cm  Result Date: 09/28/2022 CLINICAL DATA:  Two-week history of cough with acute onset nausea/vomiting EXAM: CT ANGIOGRAPHY CHEST WITH CONTRAST TECHNIQUE: Multidetector CT imaging of the chest was performed using the standard protocol during bolus administration of  intravenous contrast. Multiplanar CT image reconstructions and MIPs were obtained to evaluate the vascular anatomy. RADIATION DOSE REDUCTION: This exam was performed according to the departmental dose-optimization program which includes automated exposure control, adjustment of the mA and/or kV according to patient size and/or use of iterative reconstruction technique. CONTRAST:  16m OMNIPAQUE IOHEXOL 350 MG/ML SOLN COMPARISON:  Chest radiograph dated 09/28/2022, CT abdomen and pelvis dated 04/11/2022 FINDINGS: Cardiovascular: The study is high quality for the evaluation of pulmonary embolism. There are no filling defects in the central, lobar, segmental or subsegmental pulmonary artery branches to suggest acute pulmonary embolism. Great vessels are normal in course and caliber. Left atrial enlargement. No significant pericardial fluid/thickening. Coronary artery calcifications and aortic atherosclerosis. Mediastinum/Nodes: Thyroid gland without nodules meeting criteria for imaging follow-up by size. Patulous esophagus containing layering secretions. No pathologically enlarged axillary, supraclavicular, mediastinal, or hilar lymph nodes. Lungs/Pleura: The central airways are patent. Trace layering secretions within the trachea. Mild bronchial wall thickening. Ground-glass densities and consolidation involving the lower lobes bilaterally. Subsegmental atelectasis of the lingula and middle lobe with additional scattered ground-glass nodules in the right upper lobe. No pneumothorax. No pleural effusion. Upper abdomen: Subcentimeter hypodensities in the right hepatic lobe (5:92), too small to characterize but unchanged from prior examinations. Cholecystectomy. 2.2 cm peripherally calcified medial splenic cyst is again seen (5:90). Findings of prior bowel resection in the right upper quadrant. Musculoskeletal: No acute or abnormal lytic or blastic osseous lesions. Review of the MIP images confirms the above findings.  IMPRESSION: 1. No evidence of pulmonary embolism. 2. Ground-glass densities and consolidation involving the lower lobes bilaterally with additional scattered ground-glass nodules in the right upper lobe. Findings are suspicious for multifocal infection/aspiration. 3. Coronary artery calcifications. Aortic Atherosclerosis (ICD10-I70.0). Electronically Signed   By: LDarrin NipperM.D.   On: 09/28/2022 14:30  DG Chest 2 View  Result Date: 09/28/2022 CLINICAL DATA:  Dyspnea, nausea, vomiting EXAM: CHEST - 2 VIEW COMPARISON:  08/27/2022 FINDINGS: Normal heart size, mediastinal contours, and pulmonary vascularity. Minimal bibasilar atelectasis. Lungs otherwise clear. No pulmonary infiltrate, pleural effusion, or pneumothorax. Bowel interposition between liver and diaphragm. No osseous abnormalities. IMPRESSION: Minimal bibasilar atelectasis. Electronically Signed   By: Lavonia Dana M.D.   On: 09/28/2022 09:30    Scheduled Meds:  carvedilol  6.25 mg Oral BID   Chlorhexidine Gluconate Cloth  6 each Topical Daily   diltiazem  180 mg Oral BID   guaiFENesin-dextromethorphan  10 mL Oral Q8H   levothyroxine  75 mcg Oral QAC breakfast   prochlorperazine  25 mg Rectal Once   traZODone  50 mg Oral QHS   Warfarin - Pharmacist Dosing Inpatient   Does not apply q1600   Continuous Infusions:  sodium chloride 100 mL/hr at 09/29/22 0831   azithromycin 500 mg (09/29/22 1400)   cefTRIAXone (ROCEPHIN)  IV 2 g (09/29/22 1448)   diltiazem (CARDIZEM) infusion 15 mg/hr (09/29/22 1444)    LOS: 1 day    Roxan Hockey M.D on 09/29/2022 at 5:28 PM  Go to [email or web address] - for contact info  [facility] - Office  [phone]  If 7PM-7AM, please contact night-coverage [email or web address] 09/29/2022, 5:28 PM

## 2022-09-30 ENCOUNTER — Inpatient Hospital Stay (HOSPITAL_COMMUNITY): Payer: PPO

## 2022-09-30 DIAGNOSIS — J189 Pneumonia, unspecified organism: Secondary | ICD-10-CM | POA: Diagnosis not present

## 2022-09-30 LAB — PROTIME-INR
INR: 1.6 — ABNORMAL HIGH (ref 0.8–1.2)
Prothrombin Time: 18.9 seconds — ABNORMAL HIGH (ref 11.4–15.2)

## 2022-09-30 MED ORDER — WARFARIN SODIUM 2 MG PO TABS
3.0000 mg | ORAL_TABLET | Freq: Once | ORAL | Status: AC
  Administered 2022-09-30: 3 mg via ORAL
  Filled 2022-09-30: qty 1

## 2022-09-30 MED ORDER — LEVALBUTEROL HCL 0.63 MG/3ML IN NEBU
0.6300 mg | INHALATION_SOLUTION | Freq: Two times a day (BID) | RESPIRATORY_TRACT | Status: DC
  Administered 2022-09-30 – 2022-10-01 (×5): 0.63 mg via RESPIRATORY_TRACT
  Filled 2022-09-30 (×4): qty 3

## 2022-09-30 MED ORDER — IPRATROPIUM BROMIDE 0.02 % IN SOLN
0.5000 mg | Freq: Two times a day (BID) | RESPIRATORY_TRACT | Status: DC
  Administered 2022-09-30 – 2022-10-01 (×5): 0.5 mg via RESPIRATORY_TRACT
  Filled 2022-09-30 (×4): qty 2.5

## 2022-09-30 MED ORDER — IPRATROPIUM-ALBUTEROL 0.5-2.5 (3) MG/3ML IN SOLN
3.0000 mL | Freq: Once | RESPIRATORY_TRACT | Status: AC
  Administered 2022-09-30: 3 mL via RESPIRATORY_TRACT
  Filled 2022-09-30: qty 3

## 2022-09-30 MED ORDER — LEVALBUTEROL HCL 0.63 MG/3ML IN NEBU
INHALATION_SOLUTION | RESPIRATORY_TRACT | Status: AC
  Administered 2022-09-30: 0.63 mg
  Filled 2022-09-30: qty 3

## 2022-09-30 MED ORDER — MAGNESIUM SULFATE 2 GM/50ML IV SOLN
2.0000 g | Freq: Once | INTRAVENOUS | Status: AC
  Administered 2022-09-30: 2 g via INTRAVENOUS
  Filled 2022-09-30: qty 50

## 2022-09-30 MED ORDER — PROCHLORPERAZINE 25 MG RE SUPP: 25.0000 mg | Freq: Two times a day (BID) | RECTAL | Status: DC | PRN

## 2022-09-30 MED ORDER — IPRATROPIUM BROMIDE 0.02 % IN SOLN
RESPIRATORY_TRACT | Status: AC
  Administered 2022-09-30: 0.5 mg
  Filled 2022-09-30: qty 2.5

## 2022-09-30 MED ORDER — DILTIAZEM HCL ER COATED BEADS 180 MG PO CP24
360.0000 mg | ORAL_CAPSULE | Freq: Every day | ORAL | Status: DC
  Administered 2022-09-30 – 2022-10-01 (×2): 360 mg via ORAL
  Filled 2022-09-30 (×3): qty 2

## 2022-09-30 MED ORDER — GUAIFENESIN-DM 100-10 MG/5ML PO SYRP
5.0000 mL | ORAL_SOLUTION | ORAL | Status: DC | PRN
  Administered 2022-09-30 – 2022-10-06 (×7): 5 mL via ORAL
  Filled 2022-09-30 (×7): qty 5

## 2022-09-30 NOTE — Progress Notes (Signed)
ANTICOAGULATION CONSULT NOTE -   Pharmacy Consult for warfarin Indication: atrial fibrillation  No Known Allergies  Patient Measurements: Height: 5' 8"  (172.7 cm) Weight: 73 kg (160 lb 15 oz) IBW/kg (Calculated) : 68.4 Heparin Dosing Weight:   Vital Signs: Temp: 97.5 F (36.4 C) (12/16 0750) Temp Source: Oral (12/16 0750) BP: 137/79 (12/16 0600) Pulse Rate: 132 (12/16 0600)  Labs: Recent Labs    09/28/22 0906 09/28/22 1021 09/28/22 1235 09/28/22 1720 09/29/22 0418 09/30/22 0327  HGB 14.6  --   --   --  13.2  --   HCT 43.7  --   --   --  38.6*  --   PLT 159  --   --   --  153  --   LABPROT  --   --   --  19.8* 19.5* 18.9*  INR  --   --   --  1.7* 1.7* 1.6*  CREATININE 1.49*  --   --   --  1.31*  --   TROPONINIHS  --  4 4  --   --   --      Estimated Creatinine Clearance: 37 mL/min (A) (by C-G formula based on SCr of 1.31 mg/dL (H)).   Medical History: Past Medical History:  Diagnosis Date   Anemia    Atrial fibrillation (HCC)    BPH (benign prostatic hyperplasia)    CAD (coronary artery disease)    Catheterization 2004, mild/moderate nonobstructive disease  /   nuclear, 2007, small inferior scar // no ischemia   Crohn's disease ([place])    Elevated PSA    GERD (gastroesophageal reflux disease)    History of kidney stones    History of pneumonia    Hypothyroidism    Mitral regurgitation    SBO (small bowel obstruction) ([place])    Urinary retention    Wears glasses     Medications:  Medications Prior to Admission  Medication Sig Dispense Refill Last Dose   acetaminophen (TYLENOL) 500 MG tablet Take 500 mg by mouth every 6 (six) hours as needed for mild pain or moderate pain.   unk   carvedilol (COREG) 6.25 MG tablet TAKE ONE TABLET BY MOUTH TWICE DAILY 60 tablet 6 09/27/2022   Cholecalciferol (VITAMIN D3) 50 MCG (2000 UT) TABS Take 1 tablet by mouth daily.   09/27/2022   cyclobenzaprine (FLEXERIL) 10 MG tablet Take 10 mg by mouth at bedtime.   09/27/2022    dextromethorphan-guaiFENesin (MUCINEX DM) 30-600 MG 12hr tablet Take 1 tablet by mouth 2 (two) times daily.   09/27/2022   diltiazem (CARDIZEM CD) 180 MG 24 hr capsule Take 180 mg by mouth 2 (two) times daily.   09/27/2022   levothyroxine (SYNTHROID, LEVOTHROID) 75 MCG tablet Take 75 mcg by mouth daily before breakfast.   09/27/2022   magnesium gluconate (MAGONATE) 500 MG tablet Take 500 mg by mouth 2 (two) times daily.   09/27/2022   Melatonin 12 MG TABS Take 15 mg by mouth at bedtime.   09/27/2022   nitroGLYCERIN (NITROSTAT) 0.4 MG SL tablet Place 0.4 mg under the tongue every 5 (five) minutes as needed. For chest pains. May repeat for up to 3 doses.   unk   omeprazole (PRILOSEC) 40 MG capsule Take 1 capsule (40 mg total) by mouth daily. 90 capsule 3 09/27/2022   Probiotic Product (ALIGN) 4 MG CAPS Take by mouth in the morning.   09/27/2022   traZODone (DESYREL) 50 MG tablet Take 50 mg by mouth  at bedtime.   09/27/2022   warfarin (COUMADIN) 2.5 MG tablet TAKE 1-1 &1/2 TABLETS BY MOUTH DAILY AS DIRECTED 45 tablet 6 09/27/2022 at 0900   Adalimumab (HUMIRA) 40 MG/0.4ML PSKT Inject 40 mg into the skin every 14 (fourteen) days.       Assessment: Pharmacy consulted to dose warfarin in patient with atrial fibrillation.    PTA warfarin dose: 2.17m daily except 1.280mon Wed per last outpatient anticoagulation clinic note 09/26/22    INR 1.6 CBC WNL  Goal of Therapy:  INR 2-3 Monitor platelets by anticoagulation protocol: Yes   Plan:  Warfarin 3 mg x 1 dose. Monitor daily INR and s/s of bleeding.  StMargot AblesPharmD Clinical Pharmacist 09/30/2022 8:09 AM

## 2022-09-30 NOTE — Progress Notes (Signed)
PROGRESS NOTE     [name], is a 88 y.o. male, DOB - [birth year], UTM:[id number]  Admit date - 09/28/2022   Admitting Physician Bethena Roys, MD  Outpatient Primary MD for the patient is [name], [name], MD  LOS - 2  Chief Complaint  Patient presents with   Cough        Brief Narrative:   88 y.o. male with medical history significant for Atria Fibrillation, Crohn's, diastolic CHF, CAD admitted on 09/28/22 with multifocal pneumonia and A-fib with RVR    -Assessment and Plan: 1)Multifocal pneumonia--- cannot rule out aspiration component Admission CTA chest-multifocal pneumonia/aspiration -c/n IV ceftriaxone and azithromycin pending culture data -Speech therapy eval appreciated recommends dysphagia 3 diet -Continue bronchodilators and mucolytics - 2)Atrial fibrillation with RVR ([name]) -Became very tachycardic with attempt to wean off IV Cardizem -Continue IV Cardizem currently at 15 mg/h -Continue Coreg for rate control -Coumadin for stroke prophylaxis  3)Chronic diastolic heart failure ([name]) Stable and compensated.  Not on diuretics.  Last echo 2021 EF 55%. -Consider repeat echo after heart rate control improves  4) acute hypoxic respiratory failure--- secondary to mostly #1 and #2 above -Try to wean off oxygen -Currently requiring 2 L via nasal cannula  5)Crohn's disease ([name]) Stable.  On Humira PTA -Will advise holding Humira given acute infection at this time  6)CAD (coronary artery disease) Follows with Dr. Domenic Polite.  Mild/moderate nonobstructive disease 2004.  No chest pain.  EKG unchanged. -Continue Coreg, no aspirin as patient is already on Coumadin  7)Hypothyroidism-continue levothyroxine  8) intractable emesis----abdominal x-rays without obstructive pattern -Continue as needed antiemetics  Status is: Inpatient   Disposition: The patient is from: Home              Anticipated d/c is to: Home              Anticipated d/c date is: 3 days               Patient currently is not medically stable to d/c. Barriers: Not Clinically Stable-   Code Status :  -  Code Status: Full Code   Family Communication:    (patient is alert, awake and coherent)  -Discussed with his wife at bedside  DVT Prophylaxis  :   - SCDs /Coumadin     Lab Results  Component Value Date   PLT 153 09/29/2022   Inpatient Medications  Scheduled Meds:  carvedilol  6.25 mg Oral BID   Chlorhexidine Gluconate Cloth  6 each Topical Daily   diltiazem  360 mg Oral Daily   ipratropium  0.5 mg Nebulization BID   ipratropium-albuterol  3 mL Nebulization Once   levalbuterol  0.63 mg Nebulization BID   levothyroxine  75 mcg Oral QAC breakfast   traZODone  50 mg Oral QHS   Warfarin - Pharmacist Dosing Inpatient   Does not apply q1600   Continuous Infusions:  azithromycin 500 mg (09/30/22 1500)   cefTRIAXone (ROCEPHIN)  IV 2 g (09/30/22 1516)   diltiazem (CARDIZEM) infusion 15 mg/hr (09/30/22 1809)   PRN Meds:.acetaminophen **OR** acetaminophen, guaiFENesin-dextromethorphan, ondansetron **OR** ondansetron (ZOFRAN) IV, polyethylene glycol, prochlorperazine   Anti-infectives (From admission, onward)    Start     Dose/Rate Route Frequency Ordered Stop   09/29/22 1500  cefTRIAXone (ROCEPHIN) 2 g in sodium chloride 0.9 % 100 mL IVPB        2 g 200 mL/hr over 30 Minutes Intravenous Every 24 hours 09/28/22 1900 10/03/22 1459   09/29/22  1500  azithromycin (ZITHROMAX) 500 mg in sodium chloride 0.9 % 250 mL IVPB  Status:  Discontinued        500 mg 250 mL/hr over 60 Minutes Intravenous Every 24 hours 09/28/22 1900 09/29/22 0849   09/29/22 1500  azithromycin (ZITHROMAX) 500 mg in sodium chloride 0.9 % 250 mL IVPB        500 mg 250 mL/hr over 60 Minutes Intravenous Every 24 hours 09/29/22 0849 10/03/22 1459   09/28/22 1515  cefTRIAXone (ROCEPHIN) 2 g in sodium chloride 0.9 % 100 mL IVPB        2 g 200 mL/hr over 30 Minutes Intravenous  Once 09/28/22 1500 09/28/22 1640    09/28/22 1515  azithromycin (ZITHROMAX) 500 mg in sodium chloride 0.9 % 250 mL IVPB        500 mg 250 mL/hr over 60 Minutes Intravenous  Once 09/28/22 1500 09/28/22 1837       Subjective: [name] today has no fevers,   No chest pain,  Wife at bedside, questions answered- -cough and wheezing persist - - Objective: Vitals:   09/30/22 1215 09/30/22 1230 09/30/22 1240 09/30/22 1618  BP:  (!) 149/88 132/79   Pulse: 91 94  (!) 103  Resp: 16 (!) 21 18 (!) 22  Temp:   98.4 F (36.9 C) 98 F (36.7 C)  TempSrc:   Oral Oral  SpO2: 96% 96%  96%  Weight:      Height:        Intake/Output Summary (Last 24 hours) at 09/30/2022 1830 Last data filed at 09/30/2022 1314 Gross per 24 hour  Intake 1308.35 ml  Output 300 ml  Net 1008.35 ml   Filed Weights   09/28/22 0840 09/29/22 0413 09/30/22 0230  Weight: 68.9 kg 69.1 kg 73 kg   Physical Exam Gen:- Awake Alert,  in no apparent distress  HEENT:- [name].AT, No sclera icterus Nose- [place] 2L/min Ears- HOH Neck-Supple Neck,No JVD,.  Lungs-diminished breath sounds with scattered rhonchi and wheezes CV- S1, S2 normal, irregularly irregular and tachy abd-  +ve B.Sounds, Abd Soft, No tenderness,    Extremity/Skin:- No  edema, pedal pulses present  Psych-affect is appropriate, oriented x3 Neuro-Generalized weakness, no new focal deficits, no tremors  Data Reviewed: I have personally reviewed following labs and imaging studies  CBC: Recent Labs  Lab 09/28/22 0906 09/29/22 0418  WBC 11.2* 13.4*  NEUTROABS 9.2*  --   HGB 14.6 13.2  HCT 43.7 38.6*  MCV 92.4 91.5  PLT 159 503   Basic Metabolic Panel: Recent Labs  Lab 09/28/22 0906 09/29/22 0418  NA 137 136  K 4.2 3.5  CL 101 102  CO2 28 25  GLUCOSE 132* 129*  BUN 18 19  CREATININE 1.49* 1.31*  CALCIUM 9.1 8.3*  MG  --  1.4*   GFR: Estimated Creatinine Clearance: 37 mL/min (A) (by C-G formula based on SCr of 1.31 mg/dL (H)). Liver Function Tests: Recent Labs  Lab  09/28/22 0906  AST 37  ALT 36  ALKPHOS 94  BILITOT 1.3*  PROT 6.9  ALBUMIN 3.6   Recent Results (from the past 240 hour(s))  Resp panel by RT-PCR (RSV, Flu A&B, Covid) Anterior Nasal Swab     Status: None   Collection Time: 09/28/22  9:45 AM   Specimen: Anterior Nasal Swab  Result Value Ref Range Status   SARS Coronavirus 2 by RT PCR NEGATIVE NEGATIVE Final    Comment: (NOTE) SARS-CoV-2 target nucleic acids are NOT DETECTED.  The SARS-CoV-2 RNA is generally detectable in upper respiratory specimens during the acute phase of infection. The lowest concentration of SARS-CoV-2 viral copies this assay can detect is 138 copies/mL. A negative result does not preclude SARS-Cov-2 infection and should not be used as the sole basis for treatment or other patient management decisions. A negative result may occur with  improper specimen collection/handling, submission of specimen other than nasopharyngeal swab, presence of viral mutation(s) within the areas targeted by this assay, and inadequate number of viral copies(<138 copies/mL). A negative result must be combined with clinical observations, patient history, and epidemiological information. The expected result is Negative.  Fact Sheet for Patients:  EntrepreneurPulse.com.au  Fact Sheet for Healthcare Providers:  IncredibleEmployment.be  This test is no t yet approved or cleared by the [place] FDA and  has been authorized for detection and/or diagnosis of SARS-CoV-2 by FDA under an Emergency Use Authorization (EUA). This EUA will remain  in effect (meaning this test can be used) for the duration of the COVID-19 declaration under Section 564(b)(1) of the Act, 21 U.S.C.section 360bbb-3(b)(1), unless the authorization is terminated  or revoked sooner.       Influenza A by PCR NEGATIVE NEGATIVE Final   Influenza B by PCR NEGATIVE NEGATIVE Final    Comment: (NOTE) The Xpert Xpress  SARS-CoV-2/FLU/RSV plus assay is intended as an aid in the diagnosis of influenza from Nasopharyngeal swab specimens and should not be used as a sole basis for treatment. Nasal washings and aspirates are unacceptable for Xpert Xpress SARS-CoV-2/FLU/RSV testing.  Fact Sheet for Patients: EntrepreneurPulse.com.au  Fact Sheet for Healthcare Providers: IncredibleEmployment.be  This test is not yet approved or cleared by the [place] FDA and has been authorized for detection and/or diagnosis of SARS-CoV-2 by FDA under an Emergency Use Authorization (EUA). This EUA will remain in effect (meaning this test can be used) for the duration of the COVID-19 declaration under Section 564(b)(1) of the Act, 21 U.S.C. section 360bbb-3(b)(1), unless the authorization is terminated or revoked.     Resp Syncytial Virus by PCR NEGATIVE NEGATIVE Final    Comment: (NOTE) Fact Sheet for Patients: EntrepreneurPulse.com.au  Fact Sheet for Healthcare Providers: IncredibleEmployment.be  This test is not yet approved or cleared by the [place] FDA and has been authorized for detection and/or diagnosis of SARS-CoV-2 by FDA under an Emergency Use Authorization (EUA). This EUA will remain in effect (meaning this test can be used) for the duration of the COVID-19 declaration under Section 564(b)(1) of the Act, 21 U.S.C. section 360bbb-3(b)(1), unless the authorization is terminated or revoked.  Performed at [facility], [address] [place], [place] [address]   Culture, blood (Routine X 2) w Reflex to ID Panel     Status: None (Preliminary result)   Collection Time: 09/28/22  5:20 PM   Specimen: BLOOD  Result Value Ref Range Status   Specimen Description BLOOD BLOOD RIGHT HAND  Final   Special Requests   Final    BOTTLES DRAWN AEROBIC AND ANAEROBIC Blood Culture adequate volume   Culture   Final    NO GROWTH 2  DAYS Performed at [facility], [address] [place], [place] [address]    Report Status PENDING  Incomplete  Culture, blood (Routine X 2) w Reflex to ID Panel     Status: None (Preliminary result)   Collection Time: 09/28/22  5:20 PM   Specimen: BLOOD  Result Value Ref Range Status   Specimen Description BLOOD BLOOD LEFT HAND  Final   Special Requests   Final    BOTTLES DRAWN AEROBIC AND ANAEROBIC Blood Culture adequate volume   Culture   Final    NO GROWTH 2 DAYS Performed at [facility], [address], [place], [place] [address]    Report Status PENDING  Incomplete  MRSA Next Gen by PCR, Nasal     Status: None   Collection Time: 09/28/22  7:01 PM   Specimen: Nasal Mucosa; Nasal Swab  Result Value Ref Range Status   MRSA by PCR Next Gen NOT DETECTED NOT DETECTED Final    Comment: (NOTE) The GeneXpert MRSA Assay (FDA approved for NASAL specimens only), is one component of a comprehensive MRSA colonization surveillance program. It is not intended to diagnose MRSA infection nor to guide or monitor treatment for MRSA infections. Test performance is not FDA approved in patients less than 25 years old. Performed at [facility], [address], [place], [place] [address]      Radiology Studies: DG ABD ACUTE 2+V W 1V CHEST  Result Date: 09/29/2022 CLINICAL DATA:  Dyspnea and respiratory abnormality EXAM: DG ABDOMEN ACUTE WITH 1 VIEW CHEST COMPARISON:  Radiograph 09/28/2022 FINDINGS: Unchanged cardiomediastinal silhouette. There is bibasilar airspace disease, as seen on recent CT. No large effusion. No evidence of pneumothorax. No acute osseous abnormality. Thoracic spondylosis. Distension of bowel in the upper abdomen. IMPRESSION: Bibasilar airspace disease, as seen on recent CT, consistent with multifocal pneumonia/aspiration. Electronically Signed   By: Maurine Simmering M.D.   On: 09/29/2022 10:04    Scheduled Meds:  carvedilol  6.25 mg Oral BID   Chlorhexidine Gluconate Cloth  6  each Topical Daily   diltiazem  360 mg Oral Daily   ipratropium  0.5 mg Nebulization BID   ipratropium-albuterol  3 mL Nebulization Once   levalbuterol  0.63 mg Nebulization BID   levothyroxine  75 mcg Oral QAC breakfast   traZODone  50 mg Oral QHS   Warfarin - Pharmacist Dosing Inpatient   Does not apply q1600   Continuous Infusions:  azithromycin 500 mg (09/30/22 1500)   cefTRIAXone (ROCEPHIN)  IV 2 g (09/30/22 1516)   diltiazem (CARDIZEM) infusion 15 mg/hr (09/30/22 1809)    LOS: 2 days    Roxan Hockey M.D on 09/30/2022 at 6:30 PM  Go to [email or web address] - for contact info  [facility] - Office  [phone]  If 7PM-7AM, please contact night-coverage [email or web address] 09/30/2022, 6:30 PM

## 2022-10-01 ENCOUNTER — Inpatient Hospital Stay (HOSPITAL_COMMUNITY): Payer: PPO

## 2022-10-01 ENCOUNTER — Other Ambulatory Visit (HOSPITAL_COMMUNITY): Payer: Self-pay | Admitting: *Deleted

## 2022-10-01 DIAGNOSIS — I4891 Unspecified atrial fibrillation: Secondary | ICD-10-CM | POA: Diagnosis not present

## 2022-10-01 DIAGNOSIS — J189 Pneumonia, unspecified organism: Secondary | ICD-10-CM | POA: Diagnosis not present

## 2022-10-01 LAB — ECHOCARDIOGRAM COMPLETE
Area-P 1/2: 4.06 cm2
Calc EF: 54.6 %
Height: 68 in
S' Lateral: 3 cm
Single Plane A2C EF: 53 %
Single Plane A4C EF: 58.2 %
Weight: 2574.97 oz

## 2022-10-01 LAB — RENAL FUNCTION PANEL
Albumin: 2.6 g/dL — ABNORMAL LOW (ref 3.5–5.0)
Anion gap: 9 (ref 5–15)
BUN: 54 mg/dL — ABNORMAL HIGH (ref 8–23)
CO2: 21 mmol/L — ABNORMAL LOW (ref 22–32)
Calcium: 8.2 mg/dL — ABNORMAL LOW (ref 8.9–10.3)
Chloride: 104 mmol/L (ref 98–111)
Creatinine, Ser: 1.61 mg/dL — ABNORMAL HIGH (ref 0.61–1.24)
GFR, Estimated: 41 mL/min — ABNORMAL LOW (ref >60)
Glucose, Bld: 133 mg/dL — ABNORMAL HIGH (ref 70–99)
Phosphorus: 2.5 mg/dL (ref 2.5–4.6)
Potassium: 3.7 mmol/L (ref 3.5–5.1)
Sodium: 134 mmol/L — ABNORMAL LOW (ref 135–145)

## 2022-10-01 LAB — CBC
HCT: 32.8 % — ABNORMAL LOW (ref 39.0–52.0)
Hemoglobin: 11.3 g/dL — ABNORMAL LOW (ref 13.0–17.0)
MCH: 31.4 pg (ref 26.0–34.0)
MCHC: 34.5 g/dL (ref 30.0–36.0)
MCV: 91.1 fL (ref 80.0–100.0)
Platelets: 187 10*3/uL (ref 150–400)
RBC: 3.6 MIL/uL — ABNORMAL LOW (ref 4.22–5.81)
RDW: 14.6 % (ref 11.5–15.5)
WBC: 11.3 10*3/uL — ABNORMAL HIGH (ref 4.0–10.5)
nRBC: 0 % (ref 0.0–0.2)

## 2022-10-01 LAB — PROTIME-INR
INR: 2 — ABNORMAL HIGH (ref 0.8–1.2)
Prothrombin Time: 22.5 seconds — ABNORMAL HIGH (ref 11.4–15.2)

## 2022-10-01 LAB — OCCULT BLOOD X 1 CARD TO LAB, STOOL: Fecal Occult Bld: POSITIVE — AB

## 2022-10-01 LAB — MAGNESIUM: Magnesium: 2.5 mg/dL — ABNORMAL HIGH (ref 1.7–2.4)

## 2022-10-01 MED ORDER — BISACODYL 10 MG RE SUPP
10.0000 mg | Freq: Every day | RECTAL | Status: AC
  Administered 2022-10-01: 10 mg via RECTAL
  Filled 2022-10-01 (×2): qty 1

## 2022-10-01 MED ORDER — POLYETHYLENE GLYCOL 3350 17 G PO PACK
17.0000 g | PACK | Freq: Every day | ORAL | Status: DC
  Administered 2022-10-01: 17 g via ORAL
  Filled 2022-10-01: qty 1

## 2022-10-01 MED ORDER — FUROSEMIDE 10 MG/ML IJ SOLN
40.0000 mg | Freq: Once | INTRAMUSCULAR | Status: AC
  Administered 2022-10-01: 40 mg via INTRAVENOUS
  Filled 2022-10-01: qty 4

## 2022-10-01 MED ORDER — WARFARIN SODIUM 2.5 MG PO TABS
2.5000 mg | ORAL_TABLET | Freq: Once | ORAL | Status: AC
  Administered 2022-10-01: 2.5 mg via ORAL
  Filled 2022-10-01: qty 1

## 2022-10-01 MED ORDER — HALOPERIDOL LACTATE 5 MG/ML IJ SOLN
2.0000 mg | Freq: Once | INTRAMUSCULAR | Status: AC
  Administered 2022-10-01: 2 mg via INTRAMUSCULAR
  Filled 2022-10-01: qty 1

## 2022-10-01 MED ORDER — LEVALBUTEROL HCL 0.63 MG/3ML IN NEBU
0.6300 mg | INHALATION_SOLUTION | RESPIRATORY_TRACT | Status: DC | PRN
  Administered 2022-10-01: 0.63 mg via RESPIRATORY_TRACT
  Filled 2022-10-01 (×2): qty 3

## 2022-10-01 NOTE — Progress Notes (Signed)
ANTICOAGULATION CONSULT NOTE -   Pharmacy Consult for warfarin Indication: atrial fibrillation  No Known Allergies  Patient Measurements: Height: 5' 8"  (172.7 cm) Weight: 73 kg (160 lb 15 oz) IBW/kg (Calculated) : 68.4 Heparin Dosing Weight:   Vital Signs: Temp: 98.6 F (37 C) (12/17 0732) Temp Source: Oral (12/17 0732) BP: 126/61 (12/17 0400) Pulse Rate: 45 (12/17 0400)  Labs: Recent Labs    09/28/22 0906 09/28/22 1021 09/28/22 1235 09/28/22 1720 09/29/22 0418 09/30/22 0327 10/01/22 0422  HGB 14.6  --   --   --  13.2  --  11.3*  HCT 43.7  --   --   --  38.6*  --  32.8*  PLT 159  --   --   --  153  --  187  LABPROT  --   --   --    < > 19.5* 18.9* 22.5*  INR  --   --   --    < > 1.7* 1.6* 2.0*  CREATININE 1.49*  --   --   --  1.31*  --  1.61*  TROPONINIHS  --  4 4  --   --   --   --    < > = values in this interval not displayed.     Estimated Creatinine Clearance: 30.1 mL/min (A) (by C-G formula based on SCr of 1.61 mg/dL (H)).   Medical History: Past Medical History:  Diagnosis Date   Anemia    Atrial fibrillation (HCC)    BPH (benign prostatic hyperplasia)    CAD (coronary artery disease)    Catheterization 2004, mild/moderate nonobstructive disease  /   nuclear, 2007, small inferior scar // no ischemia   Crohn's disease ([place])    Elevated PSA    GERD (gastroesophageal reflux disease)    History of kidney stones    History of pneumonia    Hypothyroidism    Mitral regurgitation    SBO (small bowel obstruction) ([place])    Urinary retention    Wears glasses     Medications:  Medications Prior to Admission  Medication Sig Dispense Refill Last Dose   acetaminophen (TYLENOL) 500 MG tablet Take 500 mg by mouth every 6 (six) hours as needed for mild pain or moderate pain.   unk   carvedilol (COREG) 6.25 MG tablet TAKE ONE TABLET BY MOUTH TWICE DAILY 60 tablet 6 09/27/2022   Cholecalciferol (VITAMIN D3) 50 MCG (2000 UT) TABS Take 1 tablet by mouth daily.    09/27/2022   cyclobenzaprine (FLEXERIL) 10 MG tablet Take 10 mg by mouth at bedtime.   09/27/2022   dextromethorphan-guaiFENesin (MUCINEX DM) 30-600 MG 12hr tablet Take 1 tablet by mouth 2 (two) times daily.   09/27/2022   diltiazem (CARDIZEM CD) 180 MG 24 hr capsule Take 180 mg by mouth 2 (two) times daily.   09/27/2022   levothyroxine (SYNTHROID, LEVOTHROID) 75 MCG tablet Take 75 mcg by mouth daily before breakfast.   09/27/2022   magnesium gluconate (MAGONATE) 500 MG tablet Take 500 mg by mouth 2 (two) times daily.   09/27/2022   Melatonin 12 MG TABS Take 15 mg by mouth at bedtime.   09/27/2022   nitroGLYCERIN (NITROSTAT) 0.4 MG SL tablet Place 0.4 mg under the tongue every 5 (five) minutes as needed. For chest pains. May repeat for up to 3 doses.   unk   omeprazole (PRILOSEC) 40 MG capsule Take 1 capsule (40 mg total) by mouth daily. 90 capsule 3 09/27/2022  Probiotic Product (ALIGN) 4 MG CAPS Take by mouth in the morning.   09/27/2022   traZODone (DESYREL) 50 MG tablet Take 50 mg by mouth at bedtime.   09/27/2022   warfarin (COUMADIN) 2.5 MG tablet TAKE 1-1 &1/2 TABLETS BY MOUTH DAILY AS DIRECTED 45 tablet 6 09/27/2022 at 0900   Adalimumab (HUMIRA) 40 MG/0.4ML PSKT Inject 40 mg into the skin every 14 (fourteen) days.       Assessment: Pharmacy consulted to dose warfarin in patient with atrial fibrillation.    PTA warfarin dose: 2.71m daily except 1.278mon Wed per last outpatient anticoagulation clinic note 09/26/22    INR 2.0- therapeutic CBC WNL  Goal of Therapy:  INR 2-3 Monitor platelets by anticoagulation protocol: Yes   Plan:  Warfarin 2.5 mg x 1 dose. Monitor daily INR and s/s of bleeding.  StMargot AblesPharmD Clinical Pharmacist 10/01/2022 8:01 AM

## 2022-10-01 NOTE — Progress Notes (Signed)
Patient is Positive 3736.5 cc fluid , wt up 68.9 to 73 KG as of note.

## 2022-10-01 NOTE — Progress Notes (Signed)
PROGRESS NOTE     [name], is a 88 y.o. male, DOB - [birth year], CBU:[id number]  Admit date - 09/28/2022   Admitting Physician Bethena Roys, MD  Outpatient Primary MD for the patient is [name], Anthony Moment, MD  LOS - 3  Chief Complaint  Patient presents with   Cough        Brief Narrative:   88 y.o. male with medical history significant for Atria Fibrillation, Crohn's, diastolic CHF, CAD admitted on 09/28/22 with multifocal pneumonia and A-fib with RVR    -Assessment and Plan: 1)Multifocal pneumonia--- cannot rule out aspiration component Admission CTA chest-multifocal pneumonia/aspiration -c/n IV ceftriaxone and azithromycin -Speech therapy eval appreciated recommends dysphagia 3 diet -Continue bronchodilators and mucolytics - 2)Atrial fibrillation with RVR ([place]) -Became very tachycardic with attempt to wean off IV Cardizem -Weaning down IV Cardizem currently down to 7.5 mg and 50 mg -Continue Coreg for rate control -Coumadin for stroke prophylaxis  3)Chronic diastolic heart failure ([place]) Stable and compensated.  Not on diuretics.  Last echo 2021 EF 55%. -Repeat echo on 10/01/2022  4) acute hypoxic respiratory failure--- secondary to mostly #1 and #2 above -Try to wean off oxygen -Currently requiring 2 L via nasal cannula -Was not on oxygen prior to admission  5)Crohn's disease ([place]) Stable.  On Humira PTA -Will advise holding Humira given acute infection at this time  6)CAD (coronary artery disease) Follows with Dr. Domenic Polite.  Mild/moderate nonobstructive disease 2004.  No chest pain.  EKG unchanged. -Continue Coreg, no aspirin as patient is already on Coumadin -Echo pending 10/01/2022  7)Hypothyroidism-continue levothyroxine  8) intractable emesis----abdominal x-rays without obstructive pattern -Clinically improving, alert and oral intake better -Continue as needed antiemetics  Status is: Inpatient   Disposition: The patient is from: Home               Anticipated d/c is to: Home              Anticipated d/c date is: 3 days              Patient currently is not medically stable to d/c. Barriers: Not Clinically Stable-   Code Status :  -  Code Status: Full Code   Family Communication:    (patient is alert, awake and coherent)  -Discussed with his wife at bedside  DVT Prophylaxis  :   - SCDs /Coumadin     Lab Results  Component Value Date   PLT 187 10/01/2022   Inpatient Medications  Scheduled Meds:  bisacodyl  10 mg Rectal Daily   carvedilol  6.25 mg Oral BID   Chlorhexidine Gluconate Cloth  6 each Topical Daily   diltiazem  360 mg Oral Daily   ipratropium  0.5 mg Nebulization BID   levalbuterol  0.63 mg Nebulization BID   levothyroxine  75 mcg Oral QAC breakfast   polyethylene glycol  17 g Oral Daily   traZODone  50 mg Oral QHS   Warfarin - Pharmacist Dosing Inpatient   Does not apply q1600   Continuous Infusions:  azithromycin 250 mL/hr at 10/01/22 1557   cefTRIAXone (ROCEPHIN)  IV Stopped (10/01/22 1453)   diltiazem (CARDIZEM) infusion 7.5 mg/hr (10/01/22 1557)   PRN Meds:.acetaminophen **OR** acetaminophen, guaiFENesin-dextromethorphan, levalbuterol, ondansetron **OR** ondansetron (ZOFRAN) IV, prochlorperazine   Anti-infectives (From admission, onward)    Start     Dose/Rate Route Frequency Ordered Stop   09/29/22 1500  cefTRIAXone (ROCEPHIN) 2 g in sodium chloride 0.9 % 100 mL IVPB  2 g 200 mL/hr over 30 Minutes Intravenous Every 24 hours 09/28/22 1900 10/03/22 1459   09/29/22 1500  azithromycin (ZITHROMAX) 500 mg in sodium chloride 0.9 % 250 mL IVPB  Status:  Discontinued        500 mg 250 mL/hr over 60 Minutes Intravenous Every 24 hours 09/28/22 1900 09/29/22 0849   09/29/22 1500  azithromycin (ZITHROMAX) 500 mg in sodium chloride 0.9 % 250 mL IVPB        500 mg 250 mL/hr over 60 Minutes Intravenous Every 24 hours 09/29/22 0849 10/03/22 1459   09/28/22 1515  cefTRIAXone (ROCEPHIN) 2 g in sodium  chloride 0.9 % 100 mL IVPB        2 g 200 mL/hr over 30 Minutes Intravenous  Once 09/28/22 1500 09/28/22 1640   09/28/22 1515  azithromycin (ZITHROMAX) 500 mg in sodium chloride 0.9 % 250 mL IVPB        500 mg 250 mL/hr over 60 Minutes Intravenous  Once 09/28/22 1500 09/28/22 1837       Subjective: Caroll Rancher today has no fevers,   No chest pain,  Wife at bedside, questions answered- -patient daughter is on speaker phone -Patient sitting up in chair -Cough and respiratory issues improving - - Objective: Vitals:   10/01/22 1300 10/01/22 1400 10/01/22 1500 10/01/22 1604  BP: 118/67 132/61 (!) 121/55   Pulse: (!) 59 91  (!) 50  Resp: 14 (!) 22 (!) 27 (!) 26  Temp:    (!) 97.3 F (36.3 C)  TempSrc:    Oral  SpO2: 96% 97% 100% 96%  Weight:      Height:        Intake/Output Summary (Last 24 hours) at 10/01/2022 1702 Last data filed at 10/01/2022 1557 Gross per 24 hour  Intake 1388.99 ml  Output --  Net 1388.99 ml   Filed Weights   09/29/22 0413 09/30/22 0230 10/01/22 0425  Weight: 69.1 kg 73 kg 73 kg   Physical Exam Gen:- Awake Alert,  in no apparent distress  HEENT:- [place].AT, No sclera icterus Nose- [place] 2L/min Ears- HOH Neck-Supple Neck,No JVD,.  Lungs-improving air movement, no significant wheezing  CV- S1, S2 normal, irregularly irregular abd-  +ve B.Sounds, Abd Soft, No tenderness,    Extremity/Skin:- No  edema, pedal pulses present  Psych-affect is appropriate, oriented x3 Neuro-Generalized weakness, no new focal deficits, no tremors  Data Reviewed: I have personally reviewed following labs and imaging studies  CBC: Recent Labs  Lab 09/28/22 0906 09/29/22 0418 10/01/22 0422  WBC 11.2* 13.4* 11.3*  NEUTROABS 9.2*  --   --   HGB 14.6 13.2 11.3*  HCT 43.7 38.6* 32.8*  MCV 92.4 91.5 91.1  PLT 159 153 161   Basic Metabolic Panel: Recent Labs  Lab 09/28/22 0906 09/29/22 0418 10/01/22 0422  NA 137 136 134*  K 4.2 3.5 3.7  CL 101 102 104  CO2 28 25  21*  GLUCOSE 132* 129* 133*  BUN 18 19 54*  CREATININE 1.49* 1.31* 1.61*  CALCIUM 9.1 8.3* 8.2*  MG  --  1.4* 2.5*  PHOS  --   --  2.5   GFR: Estimated Creatinine Clearance: 30.1 mL/min (A) (by C-G formula based on SCr of 1.61 mg/dL (H)). Liver Function Tests: Recent Labs  Lab 09/28/22 0906 10/01/22 0422  AST 37  --   ALT 36  --   ALKPHOS 94  --   BILITOT 1.3*  --   PROT 6.9  --  ALBUMIN 3.6 2.6*   Recent Results (from the past 240 hour(s))  Resp panel by RT-PCR (RSV, Flu A&B, Covid) Anterior Nasal Swab     Status: None   Collection Time: 09/28/22  9:45 AM   Specimen: Anterior Nasal Swab  Result Value Ref Range Status   SARS Coronavirus 2 by RT PCR NEGATIVE NEGATIVE Final    Comment: (NOTE) SARS-CoV-2 target nucleic acids are NOT DETECTED.  The SARS-CoV-2 RNA is generally detectable in upper respiratory specimens during the acute phase of infection. The lowest concentration of SARS-CoV-2 viral copies this assay can detect is 138 copies/mL. A negative result does not preclude SARS-Cov-2 infection and should not be used as the sole basis for treatment or other patient management decisions. A negative result may occur with  improper specimen collection/handling, submission of specimen other than nasopharyngeal swab, presence of viral mutation(s) within the areas targeted by this assay, and inadequate number of viral copies(<138 copies/mL). A negative result must be combined with clinical observations, patient history, and epidemiological information. The expected result is Negative.  Fact Sheet for Patients:  EntrepreneurPulse.com.au  Fact Sheet for Healthcare Providers:  IncredibleEmployment.be  This test is no t yet approved or cleared by the [place] FDA and  has been authorized for detection and/or diagnosis of SARS-CoV-2 by FDA under an Emergency Use Authorization (EUA). This EUA will remain  in effect (meaning this test  can be used) for the duration of the COVID-19 declaration under Section 564(b)(1) of the Act, 21 U.S.C.section 360bbb-3(b)(1), unless the authorization is terminated  or revoked sooner.       Influenza A by PCR NEGATIVE NEGATIVE Final   Influenza B by PCR NEGATIVE NEGATIVE Final    Comment: (NOTE) The Xpert Xpress SARS-CoV-2/FLU/RSV plus assay is intended as an aid in the diagnosis of influenza from Nasopharyngeal swab specimens and should not be used as a sole basis for treatment. Nasal washings and aspirates are unacceptable for Xpert Xpress SARS-CoV-2/FLU/RSV testing.  Fact Sheet for Patients: EntrepreneurPulse.com.au  Fact Sheet for Healthcare Providers: IncredibleEmployment.be  This test is not yet approved or cleared by the [place] FDA and has been authorized for detection and/or diagnosis of SARS-CoV-2 by FDA under an Emergency Use Authorization (EUA). This EUA will remain in effect (meaning this test can be used) for the duration of the COVID-19 declaration under Section 564(b)(1) of the Act, 21 U.S.C. section 360bbb-3(b)(1), unless the authorization is terminated or revoked.     Resp Syncytial Virus by PCR NEGATIVE NEGATIVE Final    Comment: (NOTE) Fact Sheet for Patients: EntrepreneurPulse.com.au  Fact Sheet for Healthcare Providers: IncredibleEmployment.be  This test is not yet approved or cleared by the [place] FDA and has been authorized for detection and/or diagnosis of SARS-CoV-2 by FDA under an Emergency Use Authorization (EUA). This EUA will remain in effect (meaning this test can be used) for the duration of the COVID-19 declaration under Section 564(b)(1) of the Act, 21 U.S.C. section 360bbb-3(b)(1), unless the authorization is terminated or revoked.  Performed at [facility], [address] [place], [place] [address]   Culture, blood (Routine X 2) w Reflex to ID  Panel     Status: None (Preliminary result)   Collection Time: 09/28/22  5:20 PM   Specimen: BLOOD  Result Value Ref Range Status   Specimen Description BLOOD BLOOD RIGHT HAND  Final   Special Requests   Final    BOTTLES DRAWN AEROBIC AND ANAEROBIC Blood Culture adequate volume   Culture  Final    NO GROWTH 3 DAYS Performed at [facility], [address], [place], [place] [address]    Report Status PENDING  Incomplete  Culture, blood (Routine X 2) w Reflex to ID Panel     Status: None (Preliminary result)   Collection Time: 09/28/22  5:20 PM   Specimen: BLOOD  Result Value Ref Range Status   Specimen Description BLOOD BLOOD LEFT HAND  Final   Special Requests   Final    BOTTLES DRAWN AEROBIC AND ANAEROBIC Blood Culture adequate volume   Culture   Final    NO GROWTH 3 DAYS Performed at [facility], [address], [place], [place] [address]    Report Status PENDING  Incomplete  MRSA Next Gen by PCR, Nasal     Status: None   Collection Time: 09/28/22  7:01 PM   Specimen: Nasal Mucosa; Nasal Swab  Result Value Ref Range Status   MRSA by PCR Next Gen NOT DETECTED NOT DETECTED Final    Comment: (NOTE) The GeneXpert MRSA Assay (FDA approved for NASAL specimens only), is one component of a comprehensive MRSA colonization surveillance program. It is not intended to diagnose MRSA infection nor to guide or monitor treatment for MRSA infections. Test performance is not FDA approved in patients less than 47 years old. Performed at [facility], [address], [place], [place] [address]      Radiology Studies: [facility] Chest [facility] 1 View  Result Date: 09/30/2022 CLINICAL DATA:  Shortness of breath. EXAM: PORTABLE CHEST 1 VIEW COMPARISON:  CT of the chest September 28, 2022. KUB and chest x-ray September 29, 2022. FINDINGS: Dilated stomach and bowel loops in the upper abdomen. The cardiomediastinal silhouette is stable. No pneumothorax. No nodules or masses. Bibasilar opacities consistent with  multifocal pneumonia on the recent chest CT have not been well appreciated on chest x-rays but there is no significant change. A linear opacity in the right mid lung is likely fluid in the fissure. IMPRESSION: 1. Bibasilar opacities consistent with multifocal pneumonia on the recent chest CT have not been well appreciated on chest x-rays but there is no significant change. 2. Dilated stomach and bowel loops in the upper abdomen. Recommend dedicated imaging of the abdomen. Electronically Signed   By: Dorise Bullion III M.D.   On: 09/30/2022 19:33    Scheduled Meds:  bisacodyl  10 mg Rectal Daily   carvedilol  6.25 mg Oral BID   Chlorhexidine Gluconate Cloth  6 each Topical Daily   diltiazem  360 mg Oral Daily   ipratropium  0.5 mg Nebulization BID   levalbuterol  0.63 mg Nebulization BID   levothyroxine  75 mcg Oral QAC breakfast   polyethylene glycol  17 g Oral Daily   traZODone  50 mg Oral QHS   Warfarin - Pharmacist Dosing Inpatient   Does not apply q1600   Continuous Infusions:  azithromycin 250 mL/hr at 10/01/22 1557   cefTRIAXone (ROCEPHIN)  IV Stopped (10/01/22 1453)   diltiazem (CARDIZEM) infusion 7.5 mg/hr (10/01/22 1557)    LOS: 3 days   Roxan Hockey M.D on 10/01/2022 at 5:02 PM  Go to [email or web address] - for contact info  [facility] - Office  [phone]  If 7PM-7AM, please contact night-coverage [email or web address] 10/01/2022, 5:02 PM

## 2022-10-01 NOTE — Progress Notes (Signed)
  Echocardiogram 2D Echocardiogram has been performed.  Bobbye Charleston 10/01/2022, 1:18 PM

## 2022-10-02 ENCOUNTER — Inpatient Hospital Stay (HOSPITAL_COMMUNITY): Payer: PPO

## 2022-10-02 DIAGNOSIS — K920 Hematemesis: Secondary | ICD-10-CM | POA: Diagnosis not present

## 2022-10-02 DIAGNOSIS — Z7901 Long term (current) use of anticoagulants: Secondary | ICD-10-CM | POA: Diagnosis not present

## 2022-10-02 DIAGNOSIS — J189 Pneumonia, unspecified organism: Secondary | ICD-10-CM | POA: Diagnosis not present

## 2022-10-02 DIAGNOSIS — D62 Acute posthemorrhagic anemia: Secondary | ICD-10-CM | POA: Diagnosis not present

## 2022-10-02 DIAGNOSIS — K50012 Crohn's disease of small intestine with intestinal obstruction: Secondary | ICD-10-CM | POA: Diagnosis not present

## 2022-10-02 LAB — CBC WITH DIFFERENTIAL/PLATELET
Abs Immature Granulocytes: 0.09 10*3/uL — ABNORMAL HIGH (ref 0.00–0.07)
Basophils Absolute: 0 10*3/uL (ref 0.0–0.1)
Basophils Relative: 0 %
Eosinophils Absolute: 0.3 10*3/uL (ref 0.0–0.5)
Eosinophils Relative: 4 %
HCT: 27 % — ABNORMAL LOW (ref 39.0–52.0)
Hemoglobin: 9.4 g/dL — ABNORMAL LOW (ref 13.0–17.0)
Immature Granulocytes: 1 %
Lymphocytes Relative: 19 %
Lymphs Abs: 1.7 10*3/uL (ref 0.7–4.0)
MCH: 31.4 pg (ref 26.0–34.0)
MCHC: 34.8 g/dL (ref 30.0–36.0)
MCV: 90.3 fL (ref 80.0–100.0)
Monocytes Absolute: 1.3 10*3/uL — ABNORMAL HIGH (ref 0.1–1.0)
Monocytes Relative: 15 %
Neutro Abs: 5.4 10*3/uL (ref 1.7–7.7)
Neutrophils Relative %: 61 %
Platelets: 209 10*3/uL (ref 150–400)
RBC: 2.99 MIL/uL — ABNORMAL LOW (ref 4.22–5.81)
RDW: 14.9 % (ref 11.5–15.5)
WBC: 8.8 10*3/uL (ref 4.0–10.5)
nRBC: 0 % (ref 0.0–0.2)

## 2022-10-02 LAB — GLUCOSE, CAPILLARY
Glucose-Capillary: 125 mg/dL — ABNORMAL HIGH (ref 70–99)
Glucose-Capillary: 101 mg/dL — ABNORMAL HIGH (ref 70–99)

## 2022-10-02 LAB — PROTIME-INR
INR: 4 — ABNORMAL HIGH (ref 0.8–1.2)
Prothrombin Time: 38.4 seconds — ABNORMAL HIGH (ref 11.4–15.2)

## 2022-10-02 MED ORDER — VITAMIN K1 10 MG/ML IJ SOLN
5.0000 mg | Freq: Once | INTRAVENOUS | Status: AC
  Administered 2022-10-02: 5 mg via INTRAVENOUS
  Filled 2022-10-02: qty 0.5

## 2022-10-02 MED ORDER — DILTIAZEM HCL 60 MG PO TABS
90.0000 mg | ORAL_TABLET | Freq: Four times a day (QID) | ORAL | Status: DC
  Administered 2022-10-02 – 2022-10-03 (×4): 90 mg
  Filled 2022-10-02 (×5): qty 1

## 2022-10-02 MED ORDER — LEVOTHYROXINE SODIUM 75 MCG PO TABS
75.0000 ug | ORAL_TABLET | Freq: Every day | ORAL | Status: DC
  Administered 2022-10-03 – 2022-10-10 (×8): 75 ug
  Filled 2022-10-02 (×8): qty 1

## 2022-10-02 MED ORDER — IPRATROPIUM BROMIDE 0.02 % IN SOLN
0.5000 mg | Freq: Four times a day (QID) | RESPIRATORY_TRACT | Status: DC
  Administered 2022-10-02 – 2022-10-03 (×8): 0.5 mg via RESPIRATORY_TRACT
  Filled 2022-10-02 (×8): qty 2.5

## 2022-10-02 MED ORDER — ONDANSETRON HCL 4 MG PO TABS: 4.0000 mg | ORAL_TABLET | Freq: Four times a day (QID) | ORAL | Status: DC | PRN

## 2022-10-02 MED ORDER — TRAZODONE HCL 50 MG PO TABS
50.0000 mg | ORAL_TABLET | Freq: Every day | ORAL | Status: DC
  Administered 2022-10-02 – 2022-10-05 (×4): 50 mg
  Filled 2022-10-02 (×4): qty 1

## 2022-10-02 MED ORDER — ONDANSETRON HCL 4 MG/2ML IJ SOLN
4.0000 mg | Freq: Four times a day (QID) | INTRAMUSCULAR | Status: DC | PRN
  Filled 2022-10-02: qty 2

## 2022-10-02 MED ORDER — CARVEDILOL 3.125 MG PO TABS
6.2500 mg | ORAL_TABLET | Freq: Two times a day (BID) | ORAL | Status: DC
  Administered 2022-10-02 (×2): 6.25 mg
  Filled 2022-10-02 (×2): qty 2

## 2022-10-02 MED ORDER — POLYETHYLENE GLYCOL 3350 17 G PO PACK
17.0000 g | PACK | Freq: Every day | ORAL | Status: DC
  Administered 2022-10-04 – 2022-10-07 (×3): 17 g
  Filled 2022-10-02 (×4): qty 1

## 2022-10-02 MED ORDER — ACETAMINOPHEN 650 MG RE SUPP: 650.0000 mg | Freq: Four times a day (QID) | RECTAL | Status: DC | PRN

## 2022-10-02 MED ORDER — PANTOPRAZOLE SODIUM 40 MG IV SOLR
40.0000 mg | Freq: Two times a day (BID) | INTRAVENOUS | Status: DC
  Administered 2022-10-02 – 2022-10-07 (×11): 40 mg via INTRAVENOUS
  Filled 2022-10-02 (×11): qty 10

## 2022-10-02 MED ORDER — ACETAMINOPHEN 325 MG PO TABS: 650.0000 mg | ORAL_TABLET | Freq: Four times a day (QID) | ORAL | Status: DC | PRN

## 2022-10-02 MED ORDER — LEVALBUTEROL HCL 0.63 MG/3ML IN NEBU
0.6300 mg | INHALATION_SOLUTION | Freq: Four times a day (QID) | RESPIRATORY_TRACT | Status: DC
  Administered 2022-10-02 – 2022-10-03 (×8): 0.63 mg via RESPIRATORY_TRACT
  Filled 2022-10-02 (×8): qty 3

## 2022-10-02 NOTE — Progress Notes (Signed)
PT Cancellation Note  Patient Details Name: [name] MRN: [id number] DOB: Nov 02, 1930   Cancelled Treatment:    Reason Eval/Treat Not Completed: Medical issues which prohibited therapy;Fatigue/lethargy limiting ability to participate  Rayetta Humphrey, PT CLT [phone]  10/02/2022, 8:59 AM

## 2022-10-02 NOTE — Progress Notes (Signed)
Patient had 1 episode of coffee ground emesis. Patient's abdomen is taut and distended. Dr. Josephine Cables made aware. New order for NG tube. NG tube placed without complication. Xray verified. NG tube connected to low intermittent suction with coffee ground drainage. Patient resting at this time with no signs of distress. Vitals all within normal limits.

## 2022-10-02 NOTE — TOC Initial Note (Signed)
Transition of Care [facility]) - Initial/Assessment Note    Patient Details  Name: [name] MRN: [id number] Date of Birth: Oct 04, 1931  [facility]) CM/SW Contact:    Anthony Gully, LCSW Phone Number: 10/02/2022, 3:47 PM  Clinical Narrative:                 Patient from home with spouse. Admitted for multifocal pneumonia. Considered high risk for readmission. At baseline uses a cane, drives, maintains medical appointments.  TOC will follow and address needs as they arise.  Expected Discharge Plan: [place] Barriers to Discharge: Continued Medical Work up   Patient Goals and CMS Choice Patient states their goals for this hospitalization and ongoing recovery are:: return home      Expected Discharge Plan and Services Expected Discharge Plan: [place]       Living arrangements for the past 2 months: Single Family Home                                      Prior Living Arrangements/Services Living arrangements for the past 2 months: Single Family Home Lives with:: Spouse Patient language and need for interpreter reviewed:: Yes        Need for Family Participation in Patient Care: Yes (Comment) Care giver support system in place?: Yes (comment) Current home services: DME (cane) Criminal Activity/Legal Involvement Pertinent to Current Situation/Hospitalization: No - Comment as needed  Activities of Daily Living      Permission Sought/Granted Permission sought to share information with : Family Supports    Share Information with NAME: spouse, [name]           Emotional Assessment     Affect (typically observed): Appropriate   Alcohol / Substance Use: Not Applicable Psych Involvement: No (comment)  Admission diagnosis:  Atrial fibrillation with RVR ([facility]) [I48.91] Community acquired pneumonia, unspecified laterality [J18.9] Patient Active Problem List   Diagnosis Date Noted   Multifocal  pneumonia 09/28/2022   Sepsis ([place]) 09/28/2022   Small bowel stricture ([place]) 06/01/2022   Weight loss 02/28/2022   Abnormal defecation 12/30/2020   Clostridioides difficile diarrhea 12/02/2020   Hoarseness 09/14/2020   Enlarged prostate with urinary obstruction 04/29/2020   Chronic diastolic heart failure ([place]) 02/17/2020   Constipation 11/11/2019   Crohn's disease ([place]) 09/25/2019   Flatulence 09/25/2019   Shortness of breath 05/06/2015   Hip pain 07/27/2014   Upper airway cough syndrome 03/22/2014   Restrictive lung disease 03/22/2014   Encounter for therapeutic drug monitoring 12/26/2013   Hx SBO 09/18/2011   Carotid bruit    Fatigue    CAD (coronary artery disease)    Atrial fibrillation with RVR (HCC)    GERD (gastroesophageal reflux disease)    Clot    Mitral regurgitation    Warfarin anticoagulation    Dizziness 03/16/2010   Nausea vomiting and diarrhea 01/14/2010   Ejection fraction 02/13/2009   PCP:  Anthony Hilding, MD Pharmacy:   [facility], [place] [address] Phone: [phone] Fax: [phone]     Social Determinants of Health (SDOH) Interventions    Readmission Risk Interventions     No data to display

## 2022-10-02 NOTE — Progress Notes (Signed)
Cardizem drip turned off 1911. Patient's Rhythm is rate controlled afib in the 80-90s at this time.

## 2022-10-02 NOTE — Consult Note (Signed)
Gastroenterology Consult   Referring Provider: Dr. Roxan Hockey Primary Care Physician:  Manon Hilding, MD Primary Gastroenterologist:  Dr. Jenetta Downer   Patient ID: [name]; [id number]; 1931/05/15   Admit date: 09/28/2022  LOS: 4 days   Date of Consultation: 10/02/2022  Reason for Consultation:  GI bleed  History of Present Illness   [name] is an 88 y.o. year old male with past medical history of small bowel Crohn's disease status post resection complicated by recurrent stricture on Humira, C. difficile colitis, BPH, coronary artery disease, atrial fibrillation, GERD, hypothyroidism, partial SBO in June 2023 on CTE, presenting this admission with sepsis due to multifocal pneumonia, afib with RVR. GI consulted due to intractable emesis, hematemesis.   Wife at bedside. States multiple episodes of yellow emesis prior to admission. Once in ED, black emesis. He was able to advance to soft diet with vanilla pudding on Saturday, with small amount. Ate grits this morning. Wife concerned about gurgling in chest. Missed Humira shot on Saturday. No NSAIDs. No melena, hematochezia. Patient resting. Unable to give history, as very hard of hearing.   Coffee-ground emesis noted early this morning. NG tube placed due to abdominal distension. AAS 12/15 with distension of bowel in upper abdomen. INR 4.0 this morning. Last dose of Coumadin on 12/17.   Past Crohn's therapy: budesonide, mesalamine, and now Humira every 2 weeks. Humira level done Aug 2023 with adequate drug level at 10.2, no antibodies.   Last Colonoscopy: 03/23/2016 - Two small polyps at the recto-sigmoid colon and in the cecum, removed with a cold snare. Resected and retrieved. - Mild diverticulosis in the sigmoid colon. - External and internal hemorrhoids.  EGD Jan 2013 by Dr. Laural Golden: two small patches of salmon-colored mucosa at distal esophagus s/p biopsy, small hiatal hernia, 2 cm gastric polyp s/p clips. Negative  Barrett's.    Past Medical History:  Diagnosis Date   Anemia    Atrial fibrillation (HCC)    BPH (benign prostatic hyperplasia)    CAD (coronary artery disease)    Catheterization 2004, mild/moderate nonobstructive disease  /   nuclear, 2007, small inferior scar // no ischemia   Crohn's disease (HCC)    Elevated PSA    GERD (gastroesophageal reflux disease)    History of kidney stones    History of pneumonia    Hypothyroidism    Mitral regurgitation    SBO (small bowel obstruction) ([place])    Urinary retention    Wears glasses     Past Surgical History:  Procedure Laterality Date   AGILE CAPSULE  10/18/2011   Procedure: AGILE CAPSULE;  Surgeon: Rogene Houston, MD;  [facility];  Service: Endoscopy;  Laterality: N/A;  [place]  2004   CATARACT EXTRACTION W/PHACO Right 06/27/2021   Procedure: CATARACT EXTRACTION PHACO AND INTRAOCULAR LENS PLACEMENT (IOC);  Surgeon: Baruch Goldmann, MD;  Location: [facility];  Service: Ophthalmology;  Laterality: Right;  CDE 21.98   CATARACT EXTRACTION W/PHACO Left 07/11/2021   Procedure: CATARACT EXTRACTION PHACO AND INTRAOCULAR LENS PLACEMENT LEFT EYE;  Surgeon: Baruch Goldmann, MD;  Location: [facility];  Service: Ophthalmology;  Laterality: Left;  CDE=10.42   CHOLECYSTECTOMY  2010   Dr. Anthony Sar   COLONOSCOPY  2008   [facility]   COLONOSCOPY N/A 03/23/2016   Procedure: COLONOSCOPY;  Surgeon: Rogene Houston, MD;  [facility]: [facility];  Service: Endoscopy;  Laterality: N/A;  1:00   CYSTOSCOPY WITH INSERTION OF UROLIFT  ESOPHAGOGASTRODUODENOSCOPY  11/08/2011   Procedure: ESOPHAGOGASTRODUODENOSCOPY (EGD);  Surgeon: Rogene Houston, MD;  [facility]: [facility];  Service: Endoscopy;  Laterality: N/A;  300   POLYPECTOMY  03/23/2016   Procedure: POLYPECTOMY;  Surgeon: Rogene Houston, MD;  [facility]: [facility];  Service: Endoscopy;;  Cecal polyp removed via cold forceps recto-sigmoid polyp removed via cold snare   TRANSURETHRAL  RESECTION OF PROSTATE N/A 04/29/2020   Procedure: TRANSURETHRAL RESECTION OF THE PROSTATE (TURP);  Surgeon: Franchot Gallo, MD;  Location: [facility];  Service: Urology;  Laterality: N/A;  32 MINS    Prior to Admission medications   Medication Sig Start Date End Date Taking? Authorizing Provider  acetaminophen (TYLENOL) 500 MG tablet Take 500 mg by mouth every 6 (six) hours as needed for mild pain or moderate pain.   Yes [provider]  carvedilol (COREG) 6.25 MG tablet TAKE ONE TABLET BY MOUTH TWICE DAILY 05/15/22  Yes Satira Sark, MD  Cholecalciferol (VITAMIN D3) 50 MCG (2000 UT) TABS Take 1 tablet by mouth daily.   Yes [provider]  cyclobenzaprine (FLEXERIL) 10 MG tablet Take 10 mg by mouth at bedtime.   Yes [provider]  dextromethorphan-guaiFENesin (MUCINEX DM) 30-600 MG 12hr tablet Take 1 tablet by mouth 2 (two) times daily.   Yes [provider]  diltiazem (CARDIZEM CD) 180 MG 24 hr capsule Take 180 mg by mouth 2 (two) times daily.   Yes [provider]  levothyroxine (SYNTHROID, LEVOTHROID) 75 MCG tablet Take 75 mcg by mouth daily before breakfast.   Yes [provider]  magnesium gluconate (MAGONATE) 500 MG tablet Take 500 mg by mouth 2 (two) times daily. 12/02/20  Yes Rehman, Mechele Dawley, MD  Melatonin 12 MG TABS Take 15 mg by mouth at bedtime.   Yes [provider]  nitroGLYCERIN (NITROSTAT) 0.4 MG SL tablet Place 0.4 mg under the tongue every 5 (five) minutes as needed. For chest pains. May repeat for up to 3 doses.   Yes [provider]  omeprazole (PRILOSEC) 40 MG capsule Take 1 capsule (40 mg total) by mouth daily. 08/28/22  Yes Harvel Quale, MD  Probiotic Product (ALIGN) 4 MG CAPS Take by mouth in the morning.   Yes [provider]  traZODone (DESYREL) 50 MG tablet Take 50 mg by mouth at bedtime.   Yes [provider]  warfarin (COUMADIN) 2.5 MG tablet  TAKE 1-1 &1/2 TABLETS BY MOUTH DAILY AS DIRECTED 08/14/22  Yes Satira Sark, MD  Adalimumab (HUMIRA) 40 MG/0.4ML PSKT Inject 40 mg into the skin every 14 (fourteen) days.    [provider]    Current Facility-Administered Medications  Medication Dose Route Frequency Provider Last Rate Last Admin   acetaminophen (TYLENOL) tablet 650 mg  650 mg Per Tube Q6H PRN Madueme, Elvira C, RPH       Or   acetaminophen (TYLENOL) suppository 650 mg  650 mg Rectal Q6H PRN Madueme, Elvira C, RPH       azithromycin (ZITHROMAX) 500 mg in sodium chloride 0.9 % 250 mL IVPB  500 mg Intravenous Q24H Emokpae, Courage, MD   Stopped at 10/01/22 1620   bisacodyl (DULCOLAX) suppository 10 mg  10 mg Rectal Daily Emokpae, Courage, MD   10 mg at 10/01/22 1418   carvedilol (COREG) tablet 6.25 mg  6.25 mg Per Tube BID Madueme, Elvira C, RPH   6.25 mg at 10/02/22 0919   cefTRIAXone (ROCEPHIN) 2 g in  sodium chloride 0.9 % 100 mL IVPB  2 g Intravenous Q24H Berton Mount, RPH   Stopped at 10/01/22 1453   Chlorhexidine Gluconate Cloth 2 % PADS 6 each  6 each Topical Daily Emokpae, Ejiroghene E, MD   6 each at 10/02/22 0920   diltiazem (CARDIZEM) 125 mg in dextrose 5% 125 mL (1 mg/mL) infusion  5-15 mg/hr Intravenous Continuous Emokpae, Ejiroghene E, MD   Stopped at 10/01/22 1921   diltiazem (CARDIZEM) tablet 90 mg  90 mg Per Tube Q6H Emokpae, Courage, MD   90 mg at 10/02/22 0929   guaiFENesin-dextromethorphan (ROBITUSSIN DM) 100-10 MG/5ML syrup 5 mL  5 mL Oral Q4H PRN Adefeso, Oladapo, DO   5 mL at 10/02/22 0919   ipratropium (ATROVENT) nebulizer solution 0.5 mg  0.5 mg Nebulization Q6H Emokpae, Courage, MD   0.5 mg at 10/02/22 1330   levalbuterol (XOPENEX) nebulizer solution 0.63 mg  0.63 mg Nebulization Q4H PRN Roxan Hockey, MD   0.63 mg at 10/01/22 2348   levalbuterol (XOPENEX) nebulizer solution 0.63 mg  0.63 mg Nebulization Q6H Emokpae, Courage, MD   0.63 mg at 10/02/22 1330   [START ON 10/03/2022]  levothyroxine (SYNTHROID) tablet 75 mcg  75 mcg Per Tube QAC breakfast Madueme, Elvira C, RPH       ondansetron (ZOFRAN) tablet 4 mg  4 mg Per Tube Q6H PRN Madueme, Elvira C, RPH       Or   ondansetron (ZOFRAN) injection 4 mg  4 mg Intravenous Q6H PRN Madueme, Elvira C, RPH       pantoprazole (PROTONIX) injection 40 mg  40 mg Intravenous Q12H Emokpae, Courage, MD   40 mg at 10/02/22 0919   polyethylene glycol (MIRALAX / GLYCOLAX) packet 17 g  17 g Per Tube Daily Madueme, Elvira C, RPH       prochlorperazine (COMPAZINE) suppository 25 mg  25 mg Rectal Q12H PRN Emokpae, Courage, MD       traZODone (DESYREL) tablet 50 mg  50 mg Per Tube QHS Madueme, Elvira C, RPH        Allergies as of 09/28/2022   (No Known Allergies)    Family History  Problem Relation Age of Onset   Colon cancer Mother    Heart disease Father    Stroke Brother    Healthy Daughter     Social History   Socioeconomic History   Marital status: Married    Spouse name: Not on file   Number of children: Not on file   Years of education: Not on file   Highest education level: Not on file  Occupational History   Occupation: Engineer, agricultural: RETIRED  Tobacco Use   Smoking status: Never    Passive exposure: Never   Smokeless tobacco: Never  Vaping Use   Vaping Use: Never used  Substance and Sexual Activity   Alcohol use: No    Alcohol/week: 0.0 standard drinks of alcohol   Drug use: No   Sexual activity: Not on file  Other Topics Concern   Not on file  Social History Narrative   Married   Social Determinants of Health   Financial Resource Strain: Not on file  Food Insecurity: Not on file  Transportation Needs: Not on file  Physical Activity: Not on file  Stress: Not on file  Social Connections: Not on file  Intimate Partner Violence: Not on file     Review of Systems   Unable to obtain  Physical Exam  Vital Signs in last 24 hours: Temp:  [97.3 F (36.3 C)-97.5 F (36.4  C)] 97.5 F (36.4 C) (12/18 1138) Pulse Rate:  [50-119] 54 (12/18 0800) Resp:  [18-26] 21 (12/18 0800) BP: (110-132)/(48-71) 122/67 (12/18 0800) SpO2:  [95 %-100 %] 97 % (12/18 1330) Weight:  [70.9 kg] 70.9 kg (12/18 0500) Last BM Date : 10/02/22  General:   Resting with eyes closed, frail, acutely ill  Head:  Normocephalic and atraumatic. Ears:  hard of hearing Mouth:  No deformity or lesions, dentition normal. Neck:  Supple; no masses Lungs:  numerous rhonchi, coarse, 2 liters [place] O2 Heart:  S1 S2 present, irregularly irregular Abdomen:  Mildly distended, no TTP, +BS, no rebound or guarding Rectal: deferred   Msk:  Symmetrical without gross deformities. Normal posture. Extremities:  Without edema. Neurologic:  unable to assess Skin:  Intact without significant lesions or rashes.   Intake/Output from previous day: 12/17 0701 - 12/18 0700 In: 924.7 [P.O.:480; I.V.:94.8; IV Piggyback:349.8] Out: 2100 [Urine:1450; Emesis/NG output:650] Intake/Output this shift: No intake/output data recorded.   Labs/Studies   Recent Labs Recent Labs    10/01/22 0422  WBC 11.3*  HGB 11.3*  HCT 32.8*  PLT 187   BMET Recent Labs    10/01/22 0422  NA 134*  K 3.7  CL 104  CO2 21*  GLUCOSE 133*  BUN 54*  CREATININE 1.61*  CALCIUM 8.2*   LFT Recent Labs    10/01/22 0422  ALBUMIN 2.6*   PT/INR Recent Labs    10/01/22 0422 10/02/22 0909  LABPROT 22.5* 38.4*  INR 2.0* 4.0*     Radiology/Studies DG ABD ACUTE 2+V W 1V CHEST  Result Date: 10/02/2022 CLINICAL DATA:  Abdominal distension EXAM: DG ABDOMEN ACUTE WITH 1 VIEW CHEST COMPARISON:  Acute abdomen series dated 09/29/2022. FINDINGS: Single-view of the chest: Heart size and mediastinal contours are stable. LEFT basilar opacity, likely atelectasis and/or small pleural effusion. Lungs otherwise clear. No pneumothorax is seen. Supine and upright views of the abdomen: Enteric tube is stable in position, loosely coiled in  the stomach. No dilated large or small bowel loops are seen. No evidence of free intraperitoneal air. IMPRESSION: 1. LEFT basilar opacity, likely mild atelectasis and/or small pleural effusion. 2. Nonobstructive bowel gas pattern. Enteric tube is stable in position, loosely coiled in the stomach. Electronically Signed   By: Franki Cabot M.D.   On: 10/02/2022 11:30   DG Chest 1 View  Result Date: 10/02/2022 CLINICAL DATA:  NG tube placement EXAM: CHEST  1 VIEW COMPARISON:  10/01/2022 FINDINGS: Small bilateral pleural effusions, right greater than left, mildly progressive. No frank interstitial edema. Heart is normal in size. Enteric tube coursing into the mid stomach. IMPRESSION: Small bilateral pleural effusions, right greater than left, mildly progressive. Enteric tube coursing into the mid stomach. Electronically Signed   By: Julian Hy M.D.   On: 10/02/2022 00:56   DG Chest 1 View  Result Date: 10/01/2022 CLINICAL DATA:  Shortness of breath EXAM: CHEST  1 VIEW COMPARISON:  Chest x-ray 09/30/2022 FINDINGS: There are patchy opacities in the retrocardiac region. There is a band of atelectasis in the right mid lung. Heart is enlarged. There is no pleural effusion or pneumothorax. No acute fractures are seen. IMPRESSION: Patchy opacities in the retrocardiac region, which may represent atelectasis or infection. Electronically Signed   By: Ronney Asters M.D.   On: 10/01/2022 20:26   ECHOCARDIOGRAM COMPLETE  Result Date: 10/01/2022    ECHOCARDIOGRAM REPORT  Patient Name:   [name] Date of Exam: 10/01/2022 Medical Rec #:  [id number]     Height:       68.0 in Accession #:    [id number]    Weight:       160.9 lb Date of Birth:  April 22, 1931     BSA:          1.863 m Patient Age:    5 years      BP:           126/61 mmHg Patient Gender: M             HR:           78 bpm. Exam Location:  [facility] Procedure: 2D Echo, Cardiac Doppler and Color Doppler Indications:    I48.91* Unspeicified atrial  fibrillation  History:        Patient has prior history of Echocardiogram examinations, most                 recent 05/19/2020. CHF, CAD, Abnormal ECG, Arrythmias:Atrial                 Fibrillation; Signs/Symptoms:Bacteremia, Shortness of Breath and                 Dyspnea. Pneumonia.  Sonographer:    Roseanna Rainbow RDCS Referring Phys: [id number] [facility]  Sonographer Comments: Technically difficult study due to poor echo windows and suboptimal subcostal window. IMPRESSIONS  1. Left ventricular ejection fraction, by estimation, is 50 to 55%. The left ventricle has low normal function. The left ventricle has no regional wall motion abnormalities. Left ventricular diastolic parameters are indeterminate.  2. Right ventricular systolic function is normal. The right ventricular size is normal. There is normal pulmonary artery systolic pressure.  3. Large pleural effusion in the left lateral region.  4. The mitral valve is grossly normal. Mild mitral valve regurgitation. No evidence of mitral stenosis.  5. The aortic valve is tricuspid. There is mild calcification of the aortic valve. There is mild thickening of the aortic valve. Aortic valve regurgitation is trivial. No aortic stenosis is present.  6. The inferior vena cava is dilated in size with >50% respiratory variability, suggesting right atrial pressure of 8 mmHg. Comparison(s): Large left sided pleural effusion is new. FINDINGS  Left Ventricle: Left ventricular ejection fraction, by estimation, is 50 to 55%. The left ventricle has low normal function. The left ventricle has no regional wall motion abnormalities. The left ventricular internal cavity size was normal in size. Suboptimal image quality limits for assessment of left ventricular hypertrophy. Left ventricular diastolic parameters are indeterminate. Right Ventricle: The right ventricular size is normal. No increase in right ventricular wall thickness. Right ventricular systolic function is normal. There is  normal pulmonary artery systolic pressure. The tricuspid regurgitant velocity is 2.46 m/s, and  with an assumed right atrial pressure of 8 mmHg, the estimated right ventricular systolic pressure is 10.2 mmHg. Left Atrium: Left atrial size was normal in size. Right Atrium: Right atrial size was normal in size. Pericardium: Trivial pericardial effusion is present. Presence of epicardial fat layer. Mitral Valve: The mitral valve is grossly normal. Mild mitral valve regurgitation. No evidence of mitral valve stenosis. Tricuspid Valve: The tricuspid valve is normal in structure. Tricuspid valve regurgitation is trivial. No evidence of tricuspid stenosis. Aortic Valve: The aortic valve is tricuspid. There is mild calcification of the aortic valve. There is mild thickening of the aortic valve. Aortic valve regurgitation  is trivial. No aortic stenosis is present. Pulmonic Valve: The pulmonic valve was normal in structure. Pulmonic valve regurgitation is mild. No evidence of pulmonic stenosis. Aorta: The aortic root and ascending aorta are structurally normal, with no evidence of dilitation. Venous: The inferior vena cava is dilated in size with greater than 50% respiratory variability, suggesting right atrial pressure of 8 mmHg. IAS/Shunts: No atrial level shunt detected by color flow Doppler. Additional Comments: There is a large pleural effusion in the left lateral region.  LEFT VENTRICLE PLAX 2D LVIDd:         4.40 cm LVIDs:         3.00 cm LV PW:         1.20 cm LV IVS:        0.90 cm LVOT diam:     2.20 cm LV SV:         57 LV SV Index:   30 LVOT Area:     3.80 cm  LV Volumes (MOD) LV vol d, MOD A2C: 91.2 ml LV vol d, MOD A4C: 66.2 ml LV vol s, MOD A2C: 42.9 ml LV vol s, MOD A4C: 27.7 ml LV SV MOD A2C:     48.3 ml LV SV MOD A4C:     66.2 ml LV SV MOD BP:      43.2 ml RIGHT VENTRICLE            IVC RV S prime:     9.14 cm/s  IVC diam: 2.40 cm TAPSE (M-mode): 1.5 cm LEFT ATRIUM           Index        RIGHT ATRIUM            Index LA diam:      4.60 cm 2.47 cm/m   RA Area:     15.80 cm LA Vol (A2C): 49.1 ml 26.35 ml/m  RA Volume:   37.10 ml  19.91 ml/m LA Vol (A4C): 46.6 ml 25.01 ml/m  AORTIC VALVE             PULMONIC VALVE LVOT Vmax:   88.35 cm/s  PR End Diast Vel: 1.27 msec LVOT Vmean:  59.600 cm/s LVOT VTI:    0.150 m  AORTA Ao Root diam: 3.20 cm Ao Asc diam:  3.70 cm MITRAL VALVE               TRICUSPID VALVE MV Area (PHT): 4.06 cm    TR Peak grad:   24.2 mmHg MV Decel Time: 187 msec    TR Vmax:        246.00 cm/s MV E velocity: 79.00 cm/s                            SHUNTS                            Systemic VTI:  0.15 m                            Systemic Diam: 2.20 cm Rudean Haskell MD Electronically signed by Rudean Haskell MD Signature Date/Time: 10/01/2022/7:01:30 PM    Final    DG Chest Port 1 View  Result Date: 09/30/2022 CLINICAL DATA:  Shortness of breath. EXAM: PORTABLE CHEST 1 VIEW COMPARISON:  CT of the chest September 28, 2022. KUB and chest x-ray September 29, 2022. FINDINGS: Dilated stomach and bowel loops in the upper abdomen. The cardiomediastinal silhouette is stable. No pneumothorax. No nodules or masses. Bibasilar opacities consistent with multifocal pneumonia on the recent chest CT have not been well appreciated on chest x-rays but there is no significant change. A linear opacity in the right mid lung is likely fluid in the fissure. IMPRESSION: 1. Bibasilar opacities consistent with multifocal pneumonia on the recent chest CT have not been well appreciated on chest x-rays but there is no significant change. 2. Dilated stomach and bowel loops in the upper abdomen. Recommend dedicated imaging of the abdomen. Electronically Signed   By: Dorise Bullion III M.D.   On: 09/30/2022 19:33     Assessment   [name] is an 88 y.o. year old male with past medical history of small bowel Crohn's disease status post resection complicated by recurrent stricture on Humira, C. difficile colitis,  BPH, coronary artery disease, atrial fibrillation, GERD, hypothyroidism, partial SBO in June 2023 on CTE, presenting this admission with sepsis due to multifocal pneumonia, afib with RVR. GI consulted due to intractable emesis, hematemesis.    Hematemesis: reporting multiple episodes of yellow emesis prior to admission, with coffee-ground emesis once presenting to the ED and throughout hospitalization. Hgb 144.6 on admission and 11.3 today. No melena or hematochezia.  INR 4.0 today, and last dose of Coumadin was 12/17.  NG tube now in place with coffee-ground output. Suspect may be dealing with MW tear in setting of repetitive vomiting, possible esophagitis, unable to rule out ulcer, doubt malignancy.  Last EGD in 2013. AAS on 12/15 with distension of bowel in upper abdomen. Query pSBO that precipitated vomiting, as he has a history of this in setting of small bowel Crohn's disease. I repeated abdominal xray today, which shows non-obstructive bowel gas pattern. He will need EGD once stabilized from respiratory standpoint.   Crohn's disease: Humira dose was due Saturday but was unable to take as was admitted.      Plan / Recommendations    AAS (completed  Agree with PPI BID NPO for now. NGT to low wall suction Vit K for supratherapeutic INR Will reassess candidacy for EGD on 12/19. Suspect he may not be appropriate until mid week Check CBC     10/02/2022, 3:30 PM  Annitta Needs, PhD, [facility] Gastroenterology

## 2022-10-02 NOTE — Progress Notes (Signed)
SLP Cancellation Note  Patient Details Name: [name] MRN: [id number] DOB: May 05, 1931   Cancelled treatment:       Reason Eval/Treat Not Completed: Medical issues which prohibited therapy;Fatigue/lethargy limiting ability to participate. Pt currently and not appropriate for PO trials. Pt has NG tube; ST will continue efforts.  Nakiah Osgood H. Roddie Mc, CCC-SLP Speech Language Pathologist    Wende Bushy 10/02/2022, 2:41 PM

## 2022-10-02 NOTE — Progress Notes (Signed)
PROGRESS NOTE     [name], is a 88 y.o. male, DOB - [birth year], QJF:[id number]  Admit date - 09/28/2022   Admitting Physician Bethena Roys, MD  Outpatient Primary MD for the patient is [name], [name], MD  LOS - 4  Chief Complaint  Patient presents with   Cough        Brief Narrative:   88 y.o. male with medical history significant for Atria Fibrillation, Crohn's, diastolic CHF, CAD admitted on 09/28/22 with multifocal pneumonia and A-fib with RVR    -Assessment and Plan: 1)Multifocal Pneumonia--- suspect aspiration component in the setting of recurrent emesis Admission CTA chest-multifocal pneumonia/aspiration -c/n IV ceftriaxone and azithromycin -Speech therapy eval appreciated recommends dysphagia 3 diet -Continue bronchodilators and mucolytics -Leukocytosis has resolved - 2)Atrial fibrillation with RVR ([place]) -Became very tachycardic with attempt to wean off IV Cardizem -Weaning down IV Cardizem currently down to 7.5 mg and 15 mg -Continue Coreg for rate control -Coumadin for stroke prophylaxis -INR is currently supratherapeutic at 4.0  3)Acute blood loss anemia secondary to acute GI bleed/intractable emesis --- Hgb is down to 8.8 from 13.4 -Coffee-ground emesis overnight on 10/02/2022 -NG tube in situ- -continue IV PPI -INR supratherapeutic at 4.0 will give iv Vit K  especially as patient is n.p.o. and previous Coumadin dose will probably continue to make INR to trend up -Consult appreciated -Defer timing of EGD to GI team -Abdominal x-rays on 10/02/2022 without obstructive findings -Antiemetics as needed  4)Acute Hypoxic Respiratory Failure--- secondary to mostly #1 and #2 above -Try to wean off oxygen -Currently requiring 2 L via nasal cannula -Was not on oxygen prior to admission  5)Crohn's disease ([place]) Stable.  On Humira PTA -Will advise holding Humira given acute infection at this time  6)CAD (coronary artery disease) Follows with Dr.  Domenic Polite.  Mild/moderate nonobstructive disease 2004.  No chest pain.  EKG unchanged. -Continue Coreg, no aspirin as patient is already on Coumadin -Echo pending 10/01/2022  7)Hypothyroidism-continue levothyroxine  8) Chronic diastolic heart failure (HCC) Stable and compensated.  Not on diuretics.   -Repeat echo on 10/01/2022 with EF of 50 to 55% which is similar to prior -Chest x-ray with small pleural effusion but no interstitial edema -Patient is currently n.p.o. -Hold off on diuretics  Status is: Inpatient   Disposition: The patient is from: Home              Anticipated d/c is to: Home              Anticipated d/c date is: 3 days              Patient currently is not medically stable to d/c. Barriers: Not Clinically Stable-   Code Status :  -  Code Status: Full Code   Family Communication:    (patient is alert, awake and coherent)  -Discussed with his wife at bedside  DVT Prophylaxis  :   - SCDs /Coumadin     Lab Results  Component Value Date   PLT 209 10/02/2022   Inpatient Medications  Scheduled Meds:  bisacodyl  10 mg Rectal Daily   carvedilol  6.25 mg Per Tube BID   Chlorhexidine Gluconate Cloth  6 each Topical Daily   diltiazem  90 mg Per Tube Q6H   ipratropium  0.5 mg Nebulization Q6H   levalbuterol  0.63 mg Nebulization Q6H   [START ON 10/03/2022] levothyroxine  75 mcg Per Tube QAC breakfast   pantoprazole (PROTONIX) IV  40 mg  Intravenous Q12H   polyethylene glycol  17 g Per Tube Daily   traZODone  50 mg Per Tube QHS   Continuous Infusions:  diltiazem (CARDIZEM) infusion Stopped (10/01/22 1921)   phytonadione (VITAMIN K) 5 mg in dextrose 5 % 50 mL IVPB 5 mg (10/02/22 1743)   PRN Meds:.acetaminophen **OR** acetaminophen, guaiFENesin-dextromethorphan, levalbuterol, ondansetron **OR** ondansetron (ZOFRAN) IV, prochlorperazine   Anti-infectives (From admission, onward)    Start     Dose/Rate Route Frequency Ordered Stop   09/29/22 1500  cefTRIAXone  (ROCEPHIN) 2 g in sodium chloride 0.9 % 100 mL IVPB        2 g 200 mL/hr over 30 Minutes Intravenous Every 24 hours 09/28/22 1900 10/02/22 1629   09/29/22 1500  azithromycin (ZITHROMAX) 500 mg in sodium chloride 0.9 % 250 mL IVPB  Status:  Discontinued        500 mg 250 mL/hr over 60 Minutes Intravenous Every 24 hours 09/28/22 1900 09/29/22 0849   09/29/22 1500  azithromycin (ZITHROMAX) 500 mg in sodium chloride 0.9 % 250 mL IVPB        500 mg 250 mL/hr over 60 Minutes Intravenous Every 24 hours 09/29/22 0849 10/02/22 1735   09/28/22 1515  cefTRIAXone (ROCEPHIN) 2 g in sodium chloride 0.9 % 100 mL IVPB        2 g 200 mL/hr over 30 Minutes Intravenous  Once 09/28/22 1500 09/28/22 1640   09/28/22 1515  azithromycin (ZITHROMAX) 500 mg in sodium chloride 0.9 % 250 mL IVPB        500 mg 250 mL/hr over 60 Minutes Intravenous  Once 09/28/22 1500 09/28/22 1837       Subjective: Caroll Rancher today has no fevers,   No chest pain,  Wife at bedside, questions answered- -had episodes of coffee-ground emesis overnight  NG tube was placed -No chest pains - - Objective: Vitals:   10/02/22 1559 10/02/22 1600 10/02/22 1624 10/02/22 1700  BP: (!) 132/48 (!) 151/46  (!) 112/49  Pulse:  (!) 52  (!) 54  Resp:  (!) 23  (!) 22  Temp:   98.4 F (36.9 C)   TempSrc:   Axillary   SpO2:  96%  96%  Weight:      Height:        Intake/Output Summary (Last 24 hours) at 10/02/2022 1811 Last data filed at 10/02/2022 1625 Gross per 24 hour  Intake 124.18 ml  Output 2400 ml  Net -2275.82 ml   Filed Weights   09/30/22 0230 10/01/22 0425 10/02/22 0500  Weight: 73 kg 73 kg 70.9 kg   Physical Exam Gen:- Awake Alert,  in no apparent distress  HEENT:- [place].AT, No sclera icterus Nose- [place] 2L/min Nose- NG tube Ears- HOH Neck-Supple Neck,No JVD,.  Lungs-improving air movement, no significant wheezing  CV- S1, S2 normal, irregularly irregular abd-  +ve B.Sounds, Abd Soft, No tenderness,    Extremity/Skin:-  No  edema, pedal pulses present  Psych-affect is appropriate, oriented x3 Neuro-Generalized weakness, no new focal deficits, no tremors  Data Reviewed: I have personally reviewed following labs and imaging studies  CBC: Recent Labs  Lab 09/28/22 0906 09/29/22 0418 10/01/22 0422 10/02/22 1559  WBC 11.2* 13.4* 11.3* 8.8  NEUTROABS 9.2*  --   --  5.4  HGB 14.6 13.2 11.3* 9.4*  HCT 43.7 38.6* 32.8* 27.0*  MCV 92.4 91.5 91.1 90.3  PLT 159 153 187 836   Basic Metabolic Panel: Recent Labs  Lab 09/28/22 0906 09/29/22 0418 10/01/22  0422  NA 137 136 134*  K 4.2 3.5 3.7  CL 101 102 104  CO2 28 25 21*  GLUCOSE 132* 129* 133*  BUN 18 19 54*  CREATININE 1.49* 1.31* 1.61*  CALCIUM 9.1 8.3* 8.2*  MG  --  1.4* 2.5*  PHOS  --   --  2.5   GFR: Estimated Creatinine Clearance: 30.1 mL/min (A) (by C-G formula based on SCr of 1.61 mg/dL (H)). Liver Function Tests: Recent Labs  Lab 09/28/22 0906 10/01/22 0422  AST 37  --   ALT 36  --   ALKPHOS 94  --   BILITOT 1.3*  --   PROT 6.9  --   ALBUMIN 3.6 2.6*   Recent Results (from the past 240 hour(s))  Resp panel by RT-PCR (RSV, Flu A&B, Covid) Anterior Nasal Swab     Status: None   Collection Time: 09/28/22  9:45 AM   Specimen: Anterior Nasal Swab  Result Value Ref Range Status   SARS Coronavirus 2 by RT PCR NEGATIVE NEGATIVE Final    Comment: (NOTE) SARS-CoV-2 target nucleic acids are NOT DETECTED.  The SARS-CoV-2 RNA is generally detectable in upper respiratory specimens during the acute phase of infection. The lowest concentration of SARS-CoV-2 viral copies this assay can detect is 138 copies/mL. A negative result does not preclude SARS-Cov-2 infection and should not be used as the sole basis for treatment or other patient management decisions. A negative result may occur with  improper specimen collection/handling, submission of specimen other than nasopharyngeal swab, presence of viral mutation(s) within the areas  targeted by this assay, and inadequate number of viral copies(<138 copies/mL). A negative result must be combined with clinical observations, patient history, and epidemiological information. The expected result is Negative.  Fact Sheet for Patients:  EntrepreneurPulse.com.au  Fact Sheet for Healthcare Providers:  IncredibleEmployment.be  This test is no t yet approved or cleared by the [place] FDA and  has been authorized for detection and/or diagnosis of SARS-CoV-2 by FDA under an Emergency Use Authorization (EUA). This EUA will remain  in effect (meaning this test can be used) for the duration of the COVID-19 declaration under Section 564(b)(1) of the Act, 21 U.S.C.section 360bbb-3(b)(1), unless the authorization is terminated  or revoked sooner.       Influenza A by PCR NEGATIVE NEGATIVE Final   Influenza B by PCR NEGATIVE NEGATIVE Final    Comment: (NOTE) The Xpert Xpress SARS-CoV-2/FLU/RSV plus assay is intended as an aid in the diagnosis of influenza from Nasopharyngeal swab specimens and should not be used as a sole basis for treatment. Nasal washings and aspirates are unacceptable for Xpert Xpress SARS-CoV-2/FLU/RSV testing.  Fact Sheet for Patients: EntrepreneurPulse.com.au  Fact Sheet for Healthcare Providers: IncredibleEmployment.be  This test is not yet approved or cleared by the [place] FDA and has been authorized for detection and/or diagnosis of SARS-CoV-2 by FDA under an Emergency Use Authorization (EUA). This EUA will remain in effect (meaning this test can be used) for the duration of the COVID-19 declaration under Section 564(b)(1) of the Act, 21 U.S.C. section 360bbb-3(b)(1), unless the authorization is terminated or revoked.     Resp Syncytial Virus by PCR NEGATIVE NEGATIVE Final    Comment: (NOTE) Fact Sheet for  Patients: EntrepreneurPulse.com.au  Fact Sheet for Healthcare Providers: IncredibleEmployment.be  This test is not yet approved or cleared by the [place] FDA and has been authorized for detection and/or diagnosis of SARS-CoV-2 by FDA under an Emergency Use Authorization (EUA).  This EUA will remain in effect (meaning this test can be used) for the duration of the COVID-19 declaration under Section 564(b)(1) of the Act, 21 U.S.C. section 360bbb-3(b)(1), unless the authorization is terminated or revoked.  Performed at [facility], [address], [place], [place] [address]   Culture, blood (Routine X 2) w Reflex to ID Panel     Status: None (Preliminary result)   Collection Time: 09/28/22  5:20 PM   Specimen: BLOOD  Result Value Ref Range Status   Specimen Description BLOOD BLOOD RIGHT HAND  Final   Special Requests   Final    BOTTLES DRAWN AEROBIC AND ANAEROBIC Blood Culture adequate volume   Culture   Final    NO GROWTH 4 DAYS Performed at [facility], [address] [place], [place] [address]    Report Status PENDING  Incomplete  Culture, blood (Routine X 2) w Reflex to ID Panel     Status: None (Preliminary result)   Collection Time: 09/28/22  5:20 PM   Specimen: BLOOD  Result Value Ref Range Status   Specimen Description BLOOD BLOOD LEFT HAND  Final   Special Requests   Final    BOTTLES DRAWN AEROBIC AND ANAEROBIC Blood Culture adequate volume   Culture   Final    NO GROWTH 4 DAYS Performed at [facility], [address], [place], [place] [address]    Report Status PENDING  Incomplete  MRSA Next Gen by PCR, Nasal     Status: None   Collection Time: 09/28/22  7:01 PM   Specimen: Nasal Mucosa; Nasal Swab  Result Value Ref Range Status   MRSA by PCR Next Gen NOT DETECTED NOT DETECTED Final    Comment: (NOTE) The GeneXpert MRSA Assay (FDA approved for NASAL specimens only), is one component of a comprehensive MRSA colonization  surveillance program. It is not intended to diagnose MRSA infection nor to guide or monitor treatment for MRSA infections. Test performance is not FDA approved in patients less than 14 years old. Performed at [facility], [address], [place], [place] [address]      Radiology Studies: DG ABD ACUTE 2+V W 1V CHEST  Result Date: 10/02/2022 CLINICAL DATA:  Abdominal distension EXAM: DG ABDOMEN ACUTE WITH 1 VIEW CHEST COMPARISON:  Acute abdomen series dated 09/29/2022. FINDINGS: Single-view of the chest: Heart size and mediastinal contours are stable. LEFT basilar opacity, likely atelectasis and/or small pleural effusion. Lungs otherwise clear. No pneumothorax is seen. Supine and upright views of the abdomen: Enteric tube is stable in position, loosely coiled in the stomach. No dilated large or small bowel loops are seen. No evidence of free intraperitoneal air. IMPRESSION: 1. LEFT basilar opacity, likely mild atelectasis and/or small pleural effusion. 2. Nonobstructive bowel gas pattern. Enteric tube is stable in position, loosely coiled in the stomach. Electronically Signed   By: Franki Cabot M.D.   On: 10/02/2022 11:30   DG Chest 1 View  Result Date: 10/02/2022 CLINICAL DATA:  NG tube placement EXAM: CHEST  1 VIEW COMPARISON:  10/01/2022 FINDINGS: Small bilateral pleural effusions, right greater than left, mildly progressive. No frank interstitial edema. Heart is normal in size. Enteric tube coursing into the mid stomach. IMPRESSION: Small bilateral pleural effusions, right greater than left, mildly progressive. Enteric tube coursing into the mid stomach. Electronically Signed   By: Julian Hy M.D.   On: 10/02/2022 00:56   DG Chest 1 View  Result Date: 10/01/2022 CLINICAL DATA:  Shortness of breath EXAM: CHEST  1 VIEW COMPARISON:  Chest x-ray 09/30/2022 FINDINGS: There are patchy opacities in the retrocardiac region. There is a band of atelectasis in the right mid lung. Heart is  enlarged. There is no pleural effusion or pneumothorax. No acute fractures are seen. IMPRESSION: Patchy opacities in the retrocardiac region, which may represent atelectasis or infection. Electronically Signed   By: Ronney Asters M.D.   On: 10/01/2022 20:26   ECHOCARDIOGRAM COMPLETE  Result Date: 10/01/2022    ECHOCARDIOGRAM REPORT   Patient Name:   XAIVER ROSKELLEY Date of Exam: 10/01/2022 Medical Rec #:  [id number]     Height:       68.0 in Accession #:    [id number]    Weight:       160.9 lb Date of Birth:  1931-03-10     BSA:          1.863 m Patient Age:    72 years      BP:           126/61 mmHg Patient Gender: M             HR:           78 bpm. Exam Location:  [facility] Procedure: 2D Echo, Cardiac Doppler and Color Doppler Indications:    I48.91* Unspeicified atrial fibrillation  History:        Patient has prior history of Echocardiogram examinations, most                 recent 05/19/2020. CHF, CAD, Abnormal ECG, Arrythmias:Atrial                 Fibrillation; Signs/Symptoms:Bacteremia, Shortness of Breath and                 Dyspnea. Pneumonia.  Sonographer:    Roseanna Rainbow RDCS Referring Phys: [id number] [facility]  Sonographer Comments: Technically difficult study due to poor echo windows and suboptimal subcostal window. IMPRESSIONS  1. Left ventricular ejection fraction, by estimation, is 50 to 55%. The left ventricle has low normal function. The left ventricle has no regional wall motion abnormalities. Left ventricular diastolic parameters are indeterminate.  2. Right ventricular systolic function is normal. The right ventricular size is normal. There is normal pulmonary artery systolic pressure.  3. Large pleural effusion in the left lateral region.  4. The mitral valve is grossly normal. Mild mitral valve regurgitation. No evidence of mitral stenosis.  5. The aortic valve is tricuspid. There is mild calcification of the aortic valve. There is mild thickening of the aortic valve. Aortic valve  regurgitation is trivial. No aortic stenosis is present.  6. The inferior vena cava is dilated in size with >50% respiratory variability, suggesting right atrial pressure of 8 mmHg. Comparison(s): Large left sided pleural effusion is new. FINDINGS  Left Ventricle: Left ventricular ejection fraction, by estimation, is 50 to 55%. The left ventricle has low normal function. The left ventricle has no regional wall motion abnormalities. The left ventricular internal cavity size was normal in size. Suboptimal image quality limits for assessment of left ventricular hypertrophy. Left ventricular diastolic parameters are indeterminate. Right Ventricle: The right ventricular size is normal. No increase in right ventricular wall thickness. Right ventricular systolic function is normal. There is normal pulmonary artery systolic pressure. The tricuspid regurgitant velocity is 2.46 m/s, and  with an assumed right atrial pressure of 8 mmHg, the estimated right ventricular systolic pressure is 27.7 mmHg. Left Atrium: Left atrial size was normal in  size. Right Atrium: Right atrial size was normal in size. Pericardium: Trivial pericardial effusion is present. Presence of epicardial fat layer. Mitral Valve: The mitral valve is grossly normal. Mild mitral valve regurgitation. No evidence of mitral valve stenosis. Tricuspid Valve: The tricuspid valve is normal in structure. Tricuspid valve regurgitation is trivial. No evidence of tricuspid stenosis. Aortic Valve: The aortic valve is tricuspid. There is mild calcification of the aortic valve. There is mild thickening of the aortic valve. Aortic valve regurgitation is trivial. No aortic stenosis is present. Pulmonic Valve: The pulmonic valve was normal in structure. Pulmonic valve regurgitation is mild. No evidence of pulmonic stenosis. Aorta: The aortic root and ascending aorta are structurally normal, with no evidence of dilitation. Venous: The inferior vena cava is dilated in size with  greater than 50% respiratory variability, suggesting right atrial pressure of 8 mmHg. IAS/Shunts: No atrial level shunt detected by color flow Doppler. Additional Comments: There is a large pleural effusion in the left lateral region.  LEFT VENTRICLE PLAX 2D LVIDd:         4.40 cm LVIDs:         3.00 cm LV PW:         1.20 cm LV IVS:        0.90 cm LVOT diam:     2.20 cm LV SV:         57 LV SV Index:   30 LVOT Area:     3.80 cm  LV Volumes (MOD) LV vol d, MOD A2C: 91.2 ml LV vol d, MOD A4C: 66.2 ml LV vol s, MOD A2C: 42.9 ml LV vol s, MOD A4C: 27.7 ml LV SV MOD A2C:     48.3 ml LV SV MOD A4C:     66.2 ml LV SV MOD BP:      43.2 ml RIGHT VENTRICLE            IVC RV S prime:     9.14 cm/s  IVC diam: 2.40 cm TAPSE (M-mode): 1.5 cm LEFT ATRIUM           Index        RIGHT ATRIUM           Index LA diam:      4.60 cm 2.47 cm/m   RA Area:     15.80 cm LA Vol (A2C): 49.1 ml 26.35 ml/m  RA Volume:   37.10 ml  19.91 ml/m LA Vol (A4C): 46.6 ml 25.01 ml/m  AORTIC VALVE             PULMONIC VALVE LVOT Vmax:   88.35 cm/s  PR End Diast Vel: 1.27 msec LVOT Vmean:  59.600 cm/s LVOT VTI:    0.150 m  AORTA Ao Root diam: 3.20 cm Ao Asc diam:  3.70 cm MITRAL VALVE               TRICUSPID VALVE MV Area (PHT): 4.06 cm    TR Peak grad:   24.2 mmHg MV Decel Time: 187 msec    TR Vmax:        246.00 cm/s MV E velocity: 79.00 cm/s                            SHUNTS                            Systemic VTI:  0.15 m  Systemic Diam: 2.20 cm Rudean Haskell MD Electronically signed by Rudean Haskell MD Signature Date/Time: 10/01/2022/7:01:30 PM    Final    DG Chest Port 1 View  Result Date: 09/30/2022 CLINICAL DATA:  Shortness of breath. EXAM: PORTABLE CHEST 1 VIEW COMPARISON:  CT of the chest September 28, 2022. KUB and chest x-ray September 29, 2022. FINDINGS: Dilated stomach and bowel loops in the upper abdomen. The cardiomediastinal silhouette is stable. No pneumothorax. No nodules or masses.  Bibasilar opacities consistent with multifocal pneumonia on the recent chest CT have not been well appreciated on chest x-rays but there is no significant change. A linear opacity in the right mid lung is likely fluid in the fissure. IMPRESSION: 1. Bibasilar opacities consistent with multifocal pneumonia on the recent chest CT have not been well appreciated on chest x-rays but there is no significant change. 2. Dilated stomach and bowel loops in the upper abdomen. Recommend dedicated imaging of the abdomen. Electronically Signed   By: Dorise Bullion III M.D.   On: 09/30/2022 19:33    Scheduled Meds:  bisacodyl  10 mg Rectal Daily   carvedilol  6.25 mg Per Tube BID   Chlorhexidine Gluconate Cloth  6 each Topical Daily   diltiazem  90 mg Per Tube Q6H   ipratropium  0.5 mg Nebulization Q6H   levalbuterol  0.63 mg Nebulization Q6H   [START ON 10/03/2022] levothyroxine  75 mcg Per Tube QAC breakfast   pantoprazole (PROTONIX) IV  40 mg Intravenous Q12H   polyethylene glycol  17 g Per Tube Daily   traZODone  50 mg Per Tube QHS   Continuous Infusions:  diltiazem (CARDIZEM) infusion Stopped (10/01/22 1921)   phytonadione (VITAMIN K) 5 mg in dextrose 5 % 50 mL IVPB 5 mg (10/02/22 1743)    LOS: 4 days   Roxan Hockey M.D on 10/02/2022 at 6:11 PM  Go to [email or web address] - for contact info  [facility] - Office  [phone]  If 7PM-7AM, please contact night-coverage [email or web address] 10/02/2022, 6:11 PM

## 2022-10-02 NOTE — Progress Notes (Signed)
Patient hallucinating and disoriented to place, time, and situation. Patient's sleep pattern has been disturbed since admission and has not been sleeping. Patient also has rhonchi and coarse crackles throughout lung fields. Dr. Josephine Cables made aware see new orders.

## 2022-10-03 DIAGNOSIS — K920 Hematemesis: Secondary | ICD-10-CM

## 2022-10-03 DIAGNOSIS — J189 Pneumonia, unspecified organism: Secondary | ICD-10-CM | POA: Diagnosis not present

## 2022-10-03 LAB — CBC
HCT: 26.1 % — ABNORMAL LOW (ref 39.0–52.0)
Hemoglobin: 9 g/dL — ABNORMAL LOW (ref 13.0–17.0)
MCH: 31.7 pg (ref 26.0–34.0)
MCHC: 34.5 g/dL (ref 30.0–36.0)
MCV: 91.9 fL (ref 80.0–100.0)
Platelets: 232 10*3/uL (ref 150–400)
RBC: 2.84 MIL/uL — ABNORMAL LOW (ref 4.22–5.81)
RDW: 14.9 % (ref 11.5–15.5)
WBC: 7.6 10*3/uL (ref 4.0–10.5)
nRBC: 0 % (ref 0.0–0.2)

## 2022-10-03 LAB — COMPREHENSIVE METABOLIC PANEL
ALT: 17 U/L (ref 0–44)
AST: 17 U/L (ref 15–41)
Albumin: 2.3 g/dL — ABNORMAL LOW (ref 3.5–5.0)
Alkaline Phosphatase: 53 U/L (ref 38–126)
Anion gap: 6 (ref 5–15)
BUN: 50 mg/dL — ABNORMAL HIGH (ref 8–23)
CO2: 24 mmol/L (ref 22–32)
Calcium: 7.8 mg/dL — ABNORMAL LOW (ref 8.9–10.3)
Chloride: 109 mmol/L (ref 98–111)
Creatinine, Ser: 1.48 mg/dL — ABNORMAL HIGH (ref 0.61–1.24)
GFR, Estimated: 45 mL/min — ABNORMAL LOW (ref >60)
Glucose, Bld: 97 mg/dL (ref 70–99)
Potassium: 3 mmol/L — ABNORMAL LOW (ref 3.5–5.1)
Sodium: 139 mmol/L (ref 135–145)
Total Bilirubin: 0.6 mg/dL (ref 0.3–1.2)
Total Protein: 4.8 g/dL — ABNORMAL LOW (ref 6.5–8.1)

## 2022-10-03 LAB — GLUCOSE, CAPILLARY
Glucose-Capillary: 97 mg/dL (ref 70–99)
Glucose-Capillary: 90 mg/dL (ref 70–99)
Glucose-Capillary: 78 mg/dL (ref 70–99)
Glucose-Capillary: 91 mg/dL (ref 70–99)
Glucose-Capillary: 89 mg/dL (ref 70–99)
Glucose-Capillary: 98 mg/dL (ref 70–99)

## 2022-10-03 LAB — CULTURE, BLOOD (ROUTINE X 2)
Culture: NO GROWTH
Culture: NO GROWTH
Special Requests: ADEQUATE
Special Requests: ADEQUATE

## 2022-10-03 LAB — PROTIME-INR
INR: 1.7 — ABNORMAL HIGH (ref 0.8–1.2)
Prothrombin Time: 19.7 seconds — ABNORMAL HIGH (ref 11.4–15.2)

## 2022-10-03 MED ORDER — POTASSIUM CHLORIDE 10 MEQ/100ML IV SOLN
10.0000 meq | INTRAVENOUS | Status: AC
  Administered 2022-10-03 (×4): 10 meq via INTRAVENOUS
  Filled 2022-10-03 (×3): qty 100

## 2022-10-03 MED ORDER — CARVEDILOL 12.5 MG PO TABS
12.5000 mg | ORAL_TABLET | Freq: Two times a day (BID) | ORAL | Status: DC
  Administered 2022-10-03 – 2022-10-06 (×6): 12.5 mg
  Filled 2022-10-03 (×6): qty 1

## 2022-10-03 MED ORDER — POTASSIUM CHLORIDE 10 MEQ/100ML IV SOLN
10.0000 meq | INTRAVENOUS | Status: AC
  Administered 2022-10-03 (×4): 10 meq via INTRAVENOUS
  Filled 2022-10-03 (×4): qty 100

## 2022-10-03 MED ORDER — SODIUM CHLORIDE 0.9 % IV SOLN
1.0000 g | INTRAVENOUS | Status: AC
  Administered 2022-10-03 – 2022-10-05 (×3): 1 g via INTRAVENOUS
  Filled 2022-10-03 (×3): qty 10

## 2022-10-03 MED ORDER — DILTIAZEM HCL-DEXTROSE 125-5 MG/125ML-% IV SOLN (PREMIX)
5.0000 mg/h | INTRAVENOUS | Status: DC
  Administered 2022-10-03: 5 mg/h via INTRAVENOUS
  Administered 2022-10-03 – 2022-10-06 (×7): 15 mg/h via INTRAVENOUS
  Filled 2022-10-03 (×9): qty 125

## 2022-10-03 NOTE — Evaluation (Signed)
Clinical/Bedside Swallow Evaluation Patient Details  Name: [name] MRN: [id number] Date of Birth: 1931/09/09  Today's Date: 10/03/2022 Time: SLP Start Time (ACUTE ONLY): 1519 SLP Stop Time (ACUTE ONLY): 8786 SLP Time Calculation (min) (ACUTE ONLY): 27 min  Past Medical History:  Past Medical History:  Diagnosis Date   Anemia    Atrial fibrillation (HCC)    BPH (benign prostatic hyperplasia)    CAD (coronary artery disease)    Catheterization 2004, mild/moderate nonobstructive disease  /   nuclear, 2007, small inferior scar // no ischemia   Crohn's disease (HCC)    Elevated PSA    GERD (gastroesophageal reflux disease)    History of kidney stones    History of pneumonia    Hypothyroidism    Mitral regurgitation    SBO (small bowel obstruction) ([place])    Urinary retention    Wears glasses    Past Surgical History:  Past Surgical History:  Procedure Laterality Date   AGILE CAPSULE  10/18/2011   Procedure: AGILE CAPSULE;  Surgeon: Rogene Houston, MD;  [facility];  Service: Endoscopy;  Laterality: N/A;  [place]  2004   CATARACT EXTRACTION W/PHACO Right 06/27/2021   Procedure: CATARACT EXTRACTION PHACO AND INTRAOCULAR LENS PLACEMENT (IOC);  Surgeon: Baruch Goldmann, MD;  Location: [facility];  Service: Ophthalmology;  Laterality: Right;  CDE 21.98   CATARACT EXTRACTION W/PHACO Left 07/11/2021   Procedure: CATARACT EXTRACTION PHACO AND INTRAOCULAR LENS PLACEMENT LEFT EYE;  Surgeon: Baruch Goldmann, MD;  Location: [facility];  Service: Ophthalmology;  Laterality: Left;  CDE=10.42   CHOLECYSTECTOMY  2010   Dr. Anthony Sar   COLONOSCOPY  2008   [place]   COLONOSCOPY N/A 03/23/2016   Procedure: COLONOSCOPY;  Surgeon: Rogene Houston, MD;  [facility]: [facility];  Service: Endoscopy;  Laterality: N/A;  1:00   CYSTOSCOPY WITH INSERTION OF UROLIFT     ESOPHAGOGASTRODUODENOSCOPY  11/08/2011   Procedure: ESOPHAGOGASTRODUODENOSCOPY (EGD);  Surgeon: Rogene Houston,  MD;  [facility]: [facility];  Service: Endoscopy;  Laterality: N/A;  300   POLYPECTOMY  03/23/2016   Procedure: POLYPECTOMY;  Surgeon: Rogene Houston, MD;  [facility]: [facility];  Service: Endoscopy;;  Cecal polyp removed via cold forceps recto-sigmoid polyp removed via cold snare   TRANSURETHRAL RESECTION OF PROSTATE N/A 04/29/2020   Procedure: TRANSURETHRAL RESECTION OF THE PROSTATE (TURP);  Surgeon: Franchot Gallo, MD;  Location: [facility];  Service: Urology;  Laterality: N/A;  36 MINS   HPI:  [name] is a 88 y.o. male with medical history significant for Atria Fibrillation, Crohn's, diastolic CHF, CAD.  Patient presented to the ED with complaints of worsening cough over the past 2 weeks, this morning he had at least 5 episodes of vomiting was lightheaded and came to the ED.  Reports earlier into his symptoms, he had a chest x-ray done by his outpatient provider and it did not show a chest x-ray, he was prescribed Mucinex.  He denies difficulty breathing;12/18 CXR indicated Small bilateral pleural effusions, right greater than left, mildly  progressive.     Enteric tube coursing into the mid stomach; BSE generated 12/14, but pt unable to complete until this date d/t pain/decreased alertness and vomiting.    Assessment / Plan / Recommendation  Clinical Impression  Pt seen for clinical swallowing evaluation with pharyngoesophageal dysphagia noted c/b globus sensation, delayed cough (with puree) and regurgitation reported. Also, suspect delay in the initiation of the  swallow d/t presbyphagia. Pt denotes pain with swallowing (odynophagia) d/t NG tube placement.  OME revealed generalized oral weakness, but good oral coordination. Vocal quality hypophonic and congested during speaking tasks, but after oral mucosa moistened, this improved intelligibility of speech during conversation.  Pt swallowed thin via small cup/straw sips and slow rate with esophageal precautions as well as  swallowing/breathing reciprocity discussed with wife/pt (pt required cues d/t HOH status) with pt/wife in agreement.  No overt s/sx of aspiration present, but pt is at risk d/t deconditioning, hx of GERD and esophageal symptoms.  Recommend initiate a conservative diet of FULL liquids (if medically appropriate) with swallowing precautions in place as listed above re: esophageal/respiratory/aspiration as pt is at mild-mod risk for aspiration d/t medical status.  ST will f/u for diet tolerance and education re: swallowing strategies/precautions.  Thank you for this consult. SLP Visit Diagnosis: Dysphagia, pharyngoesophageal phase (R13.14)    Aspiration Risk  Mild aspiration risk;Moderate aspiration risk    Diet Recommendation   Full liquids (if medically allowed)  Medication Administration: Other (Comment) (TBD regarding medical status)    Other  Recommendations Oral Care Recommendations: Oral care BID;Staff/trained caregiver to provide oral care    Recommendations for follow up therapy are one component of a multi-disciplinary discharge planning process, led by the attending physician.  Recommendations may be updated based on patient status, additional functional criteria and insurance authorization.  Follow up Recommendations Follow physician's recommendations for discharge plan and follow up therapies      Assistance Recommended at Discharge  TBD  Functional Status Assessment Patient has had a recent decline in their functional status and demonstrates the ability to make significant improvements in function in a reasonable and predictable amount of time.  Frequency and Duration min 2x/week  1 week       Prognosis Prognosis for Safe Diet Advancement: Good Barriers to Reach Goals: Severity of deficits      Swallow Study   General Date of Onset: 09/28/22 HPI: [name] is a 88 y.o. male with medical history significant for Atria Fibrillation, Crohn's, diastolic CHF, CAD.  Patient  presented to the ED with complaints of worsening cough over the past 2 weeks, this morning he had at least 5 episodes of vomiting was lightheaded and came to the ED.  Reports earlier into his symptoms, he had a chest x-ray done by his outpatient provider and it did not show a chest x-ray, he was prescribed Mucinex.  He denies difficulty breathing;12/18 CXR indicated Small bilateral pleural effusions, right greater than left, mildly  progressive.     Enteric tube coursing into the mid stomach; BSE generated 12/14, but pt unable to complete until this date d/t pain/decreased alertness and vomiting. Type of Study: Bedside Swallow Evaluation Previous Swallow Assessment: n/a Diet Prior to this Study: NPO Temperature Spikes Noted: No Respiratory Status: Room air History of Recent Intubation: No Behavior/Cognition: Alert;Cooperative;Distractible Oral Cavity Assessment: Dry Oral Care Completed by SLP: Yes Oral Cavity - Dentition: Adequate natural dentition Vision: Functional for self-feeding Self-Feeding Abilities: Able to feed self;Needs assist Patient Positioning: Upright in bed Baseline Vocal Quality: Low vocal intensity;Wet;Other (comment) (congested; hypernasal d/t NG placement) Volitional Cough: Weak;Congested Volitional Swallow: Able to elicit    Oral/Motor/Sensory Function Overall Oral Motor/Sensory Function: Generalized oral weakness   Ice Chips Ice chips: Impaired Presentation: Spoon Pharyngeal Phase Impairments: Throat Clearing - Delayed   Thin Liquid Thin Liquid: Impaired Presentation: Cup;Straw    Nectar Thick Nectar Thick Liquid: Not tested  Honey Thick Honey Thick Liquid: Not tested   Puree Puree: Impaired Pharyngeal Phase Impairments: Cough - Delayed   Solid     Solid: Not tested (per pt request)      Elvina Sidle, M.S., CCC-SLP 10/03/2022,4:07 PM

## 2022-10-03 NOTE — Evaluation (Signed)
Physical Therapy Evaluation Patient Details Name: [name] MRN: [id number] DOB: 05/01/1931 Today's Date: 10/03/2022  History of Present Illness  PEARCE LITTLEFIELD is a 88 y.o. male with medical history significant for Atria Fibrillation, Crohn's, diastolic CHF, CAD.  Patient presented to the ED with complaints of worsening cough over the past 2 weeks, this morning he had at least 5 episodes of vomiting was lightheaded and came to the ED.  Reports earlier into his symptoms, he had a chest x-ray done by his outpatient provider and it did not show a chest x-ray, he was prescribed Mucinex.  He denies difficulty breathing.  No chest pain.  No leg swelling.  He denies palpitations.   Clinical Impression  Patient demonstrates slow labored movement for sitting up at bedside, very unsteady on feet with near loss of balance during transfer without use of AD, required use of RW for safety and able to take a few steps at bedside before having to stop due to HR increasing to 140's - RN, MD aware.  Patient tolerated sitting up in chair after therapy with his spouse present in room.  Patient will benefit from continued skilled physical therapy in hospital and recommended venue below to increase strength, balance, endurance for safe ADLs and gait.          Recommendations for follow up therapy are one component of a multi-disciplinary discharge planning process, led by the attending physician.  Recommendations may be updated based on patient status, additional functional criteria and insurance authorization.  Follow Up Recommendations Skilled nursing-short term rehab (<3 hours/day) Can patient physically be transported by private vehicle: Yes    Assistance Recommended at Discharge Intermittent Supervision/Assistance  Patient can return home with the following  A lot of help with bathing/dressing/bathroom;A lot of help with walking and/or transfers;Help with stairs or ramp for entrance;Assistance with  cooking/housework    Equipment Recommendations Rolling walker (2 wheels)  Recommendations for Other Services       Functional Status Assessment Patient has had a recent decline in their functional status and demonstrates the ability to make significant improvements in function in a reasonable and predictable amount of time.     Precautions / Restrictions Precautions Precautions: Fall Restrictions Weight Bearing Restrictions: No      Mobility  Bed Mobility Overal bed mobility: Needs Assistance Bed Mobility: Supine to Sit     Supine to sit: Min assist     General bed mobility comments: increased time, labored movement    Transfers Overall transfer level: Needs assistance Equipment used: 1 person hand held assist, Rolling walker (2 wheels) Transfers: Sit to/from Stand, Bed to chair/wheelchair/BSC Sit to Stand: Min assist, Mod assist   Step pivot transfers: Min assist, Mod assist       General transfer comment: near loss of balance when transferring with hand held assist, required use of RW for safety    Ambulation/Gait Ambulation/Gait assistance: Min assist, Mod assist Gait Distance (Feet): 10 Feet Assistive device: Rolling walker (2 wheels) Gait Pattern/deviations: Decreased step length - right, Decreased step length - left, Decreased stride length Gait velocity: decreased     General Gait Details: slow labored cadence limited mostly due to rapid HR in the 140's and c/o fatigue  Stairs            Wheelchair Mobility    Modified Rankin (Stroke Patients Only)       Balance Overall balance assessment: Needs assistance Sitting-balance support: Feet supported, No upper extremity supported Sitting balance-Leahy  Scale: Fair Sitting balance - Comments: fair/good seated at EOB   Standing balance support: During functional activity, No upper extremity supported Standing balance-Leahy Scale: Poor Standing balance comment: fair using RW                              Pertinent Vitals/Pain Pain Assessment Pain Assessment: No/denies pain    Home Living Family/patient expects to be discharged to:: Private residence Living Arrangements: Spouse/significant other Available Help at Discharge: Family Type of Home: House Home Access: Stairs to enter Entrance Stairs-Rails: None Entrance Stairs-Number of Steps: 1   Home Layout: One level Home Equipment: Conservation officer, nature (2 wheels);Cane - single point;BSC/3in1      Prior Function Prior Level of Function : Independent/Modified Independent;Driving             Mobility Comments: Lawyer SPC, drives ADLs Comments: Independent     Hand Dominance        Extremity/Trunk Assessment   Upper Extremity Assessment Upper Extremity Assessment: Generalized weakness    Lower Extremity Assessment Lower Extremity Assessment: Generalized weakness    Cervical / Trunk Assessment Cervical / Trunk Assessment: Normal  Communication   Communication: HOH  Cognition Arousal/Alertness: Awake/alert Behavior During Therapy: WFL for tasks assessed/performed Overall Cognitive Status: Within Functional Limits for tasks assessed                                          General Comments      Exercises     Assessment/Plan    PT Assessment Patient needs continued PT services  PT Problem List Decreased strength;Decreased activity tolerance;Decreased balance;Decreased mobility       PT Treatment Interventions DME instruction;Gait training;Stair training;Functional mobility training;Therapeutic activities;Therapeutic exercise;Patient/family education;Balance training    PT Goals (Current goals can be found in the Care Plan section)  Acute Rehab PT Goals Patient Stated Goal: return home after rehab PT Goal Formulation: With patient/family Time For Goal Achievement: 10/17/22 Potential to Achieve Goals: Good    Frequency Min 3X/week     Co-evaluation                AM-PAC PT "6 Clicks" Mobility  Outcome Measure Help needed turning from your back to your side while in a flat bed without using bedrails?: A Little Help needed moving from lying on your back to sitting on the side of a flat bed without using bedrails?: A Little Help needed moving to and from a bed to a chair (including a wheelchair)?: A Little Help needed standing up from a chair using your arms (e.g., wheelchair or bedside chair)?: A Little Help needed to walk in hospital room?: A Lot Help needed climbing 3-5 steps with a railing? : A Lot 6 Click Score: 16    End of Session   Activity Tolerance: Patient tolerated treatment well;Patient limited by fatigue Patient left: in chair;with call bell/phone within reach;with family/visitor present Nurse Communication: Mobility status PT Visit Diagnosis: Unsteadiness on feet (R26.81);Other abnormalities of gait and mobility (R26.89);Muscle weakness (generalized) (M62.81)    Time: 2426-8341 PT Time Calculation (min) (ACUTE ONLY): 24 min   Charges:   PT Evaluation $PT Eval Moderate Complexity: 1 Mod PT Treatments $Therapeutic Activity: 23-37 mins        2:14 PM, 10/03/22 Lonell Grandchild, MPT Physical Therapist with [facility] [phone]  [phone] office [phone] mobile phone

## 2022-10-03 NOTE — Plan of Care (Signed)
  Problem: Acute Rehab PT Goals(only PT should resolve) Goal: Pt Will Go Supine/Side To Sit Outcome: Progressing Flowsheets (Taken 10/03/2022 1415) Pt will go Supine/Side to Sit: with supervision Goal: Patient Will Transfer Sit To/From Stand Outcome: Progressing Flowsheets (Taken 10/03/2022 1415) Patient will transfer sit to/from stand:  with min guard assist  with minimal assist Goal: Pt Will Transfer Bed To Chair/Chair To Bed Outcome: Progressing Flowsheets (Taken 10/03/2022 1415) Pt will Transfer Bed to Chair/Chair to Bed:  min guard assist  with min assist Goal: Pt Will Ambulate Outcome: Progressing Flowsheets (Taken 10/03/2022 1415) Pt will Ambulate:  50 feet  with minimal assist  with min guard assist   2:15 PM, 10/03/22 Lonell Grandchild, MPT Physical Therapist with [facility] [phone] office [phone] mobile phone

## 2022-10-03 NOTE — Progress Notes (Signed)
Gastroenterology Progress Note   Referring Provider: No ref. provider found Primary Care Physician:  Manon Hilding, MD Primary Gastroenterologist:  Dr. Jenetta Downer  Patient ID: [name]; [id number]; 1931/02/07   Subjective:    Patient with NGT. Patient resting comfortably in the chair. Wife provides majority of history. Continue to have black/green return in NG. No reports of abdominal pain.   Objective:   Vital signs in last 24 hours: Temp:  [97.5 F (36.4 C)-98.4 F (36.9 C)] 97.8 F (36.6 C) (12/19 0714) Pulse Rate:  [51-122] 59 (12/19 0400) Resp:  [17-23] 21 (12/19 0400) BP: (112-151)/(39-74) 134/58 (12/19 0400) SpO2:  [94 %-99 %] 99 % (12/19 0733) Weight:  [69.8 kg] 69.8 kg (12/19 0427) Last BM Date : 10/02/22 General:   Alert,  frail. NGT in place with dark green return in tube. NAD Head:  Normocephalic and atraumatic. Eyes:  Sclera clear, no icterus.  Abdomen:  Soft, nontender and nondistended. Normal bowel sounds, without guarding, and without rebound.   Extremities:  Without clubbing, deformity or edema. Neurologic:  Alert and  oriented x4;  grossly normal neurologically.   Intake/Output from previous day: 12/18 0701 - 12/19 0700 In: 395.2 [IV Piggyback:395.2] Out: 1575 [Urine:1575] Intake/Output this shift: No intake/output data recorded.  Lab Results: CBC Recent Labs    10/01/22 0422 10/02/22 1559 10/03/22 0311  WBC 11.3* 8.8 7.6  HGB 11.3* 9.4* 9.0*  HCT 32.8* 27.0* 26.1*  MCV 91.1 90.3 91.9  PLT 187 209 232   BMET Recent Labs    10/01/22 0422 10/03/22 0311  NA 134* 139  K 3.7 3.0*  CL 104 109  CO2 21* 24  GLUCOSE 133* 97  BUN 54* 50*  CREATININE 1.61* 1.48*  CALCIUM 8.2* 7.8*   LFTs Recent Labs    10/01/22 0422 10/03/22 0311  BILITOT  --  0.6  ALKPHOS  --  53  AST  --  17  ALT  --  17  PROT  --  4.8*  ALBUMIN 2.6* 2.3*   No results for input(s): "LIPASE" in the last 72 hours. PT/INR Recent Labs    10/01/22 0422  10/02/22 0909 10/03/22 0311  LABPROT 22.5* 38.4* 19.7*  INR 2.0* 4.0* 1.7*         Imaging Studies: DG ABD ACUTE 2+V W 1V CHEST  Result Date: 10/02/2022 CLINICAL DATA:  Abdominal distension EXAM: DG ABDOMEN ACUTE WITH 1 VIEW CHEST COMPARISON:  Acute abdomen series dated 09/29/2022. FINDINGS: Single-view of the chest: Heart size and mediastinal contours are stable. LEFT basilar opacity, likely atelectasis and/or small pleural effusion. Lungs otherwise clear. No pneumothorax is seen. Supine and upright views of the abdomen: Enteric tube is stable in position, loosely coiled in the stomach. No dilated large or small bowel loops are seen. No evidence of free intraperitoneal air. IMPRESSION: 1. LEFT basilar opacity, likely mild atelectasis and/or small pleural effusion. 2. Nonobstructive bowel gas pattern. Enteric tube is stable in position, loosely coiled in the stomach. Electronically Signed   By: Franki Cabot M.D.   On: 10/02/2022 11:30   DG Chest 1 View  Result Date: 10/02/2022 CLINICAL DATA:  NG tube placement EXAM: CHEST  1 VIEW COMPARISON:  10/01/2022 FINDINGS: Small bilateral pleural effusions, right greater than left, mildly progressive. No frank interstitial edema. Heart is normal in size. Enteric tube coursing into the mid stomach. IMPRESSION: Small bilateral pleural effusions, right greater than left, mildly progressive. Enteric tube coursing into the mid stomach. Electronically Signed  By: Julian Hy M.D.   On: 10/02/2022 00:56   DG Chest 1 View  Result Date: 10/01/2022 CLINICAL DATA:  Shortness of breath EXAM: CHEST  1 VIEW COMPARISON:  Chest x-ray 09/30/2022 FINDINGS: There are patchy opacities in the retrocardiac region. There is a band of atelectasis in the right mid lung. Heart is enlarged. There is no pleural effusion or pneumothorax. No acute fractures are seen. IMPRESSION: Patchy opacities in the retrocardiac region, which may represent atelectasis or infection.  Electronically Signed   By: Ronney Asters M.D.   On: 10/01/2022 20:26   ECHOCARDIOGRAM COMPLETE  Result Date: 10/01/2022    ECHOCARDIOGRAM REPORT   Patient Name:   [name] Date of Exam: 10/01/2022 Medical Rec #:  [id number]     Height:       68.0 in Accession #:    [id number]    Weight:       160.9 lb Date of Birth:  [birth year]     BSA:          1.863 m Patient Age:    88 years      BP:           126/61 mmHg Patient Gender: M             HR:           78 bpm. Exam Location:  [facility] Procedure: 2D Echo, Cardiac Doppler and Color Doppler Indications:    I48.91* Unspeicified atrial fibrillation  History:        Patient has prior history of Echocardiogram examinations, most                 recent 05/19/2020. CHF, CAD, Abnormal ECG, Arrythmias:Atrial                 Fibrillation; Signs/Symptoms:Bacteremia, Shortness of Breath and                 Dyspnea. Pneumonia.  Sonographer:    Roseanna Rainbow RDCS Referring Phys: [id number] [facility]  Sonographer Comments: Technically difficult study due to poor echo windows and suboptimal subcostal window. IMPRESSIONS  1. Left ventricular ejection fraction, by estimation, is 50 to 55%. The left ventricle has low normal function. The left ventricle has no regional wall motion abnormalities. Left ventricular diastolic parameters are indeterminate.  2. Right ventricular systolic function is normal. The right ventricular size is normal. There is normal pulmonary artery systolic pressure.  3. Large pleural effusion in the left lateral region.  4. The mitral valve is grossly normal. Mild mitral valve regurgitation. No evidence of mitral stenosis.  5. The aortic valve is tricuspid. There is mild calcification of the aortic valve. There is mild thickening of the aortic valve. Aortic valve regurgitation is trivial. No aortic stenosis is present.  6. The inferior vena cava is dilated in size with >50% respiratory variability, suggesting right atrial pressure of 8 mmHg. Comparison(s):  Large left sided pleural effusion is new. FINDINGS  Left Ventricle: Left ventricular ejection fraction, by estimation, is 50 to 55%. The left ventricle has low normal function. The left ventricle has no regional wall motion abnormalities. The left ventricular internal cavity size was normal in size. Suboptimal image quality limits for assessment of left ventricular hypertrophy. Left ventricular diastolic parameters are indeterminate. Right Ventricle: The right ventricular size is normal. No increase in right ventricular wall thickness. Right ventricular systolic function is normal. There is normal pulmonary artery systolic pressure. The tricuspid regurgitant velocity is 2.46  m/s, and  with an assumed right atrial pressure of 8 mmHg, the estimated right ventricular systolic pressure is 31.5 mmHg. Left Atrium: Left atrial size was normal in size. Right Atrium: Right atrial size was normal in size. Pericardium: Trivial pericardial effusion is present. Presence of epicardial fat layer. Mitral Valve: The mitral valve is grossly normal. Mild mitral valve regurgitation. No evidence of mitral valve stenosis. Tricuspid Valve: The tricuspid valve is normal in structure. Tricuspid valve regurgitation is trivial. No evidence of tricuspid stenosis. Aortic Valve: The aortic valve is tricuspid. There is mild calcification of the aortic valve. There is mild thickening of the aortic valve. Aortic valve regurgitation is trivial. No aortic stenosis is present. Pulmonic Valve: The pulmonic valve was normal in structure. Pulmonic valve regurgitation is mild. No evidence of pulmonic stenosis. Aorta: The aortic root and ascending aorta are structurally normal, with no evidence of dilitation. Venous: The inferior vena cava is dilated in size with greater than 50% respiratory variability, suggesting right atrial pressure of 8 mmHg. IAS/Shunts: No atrial level shunt detected by color flow Doppler. Additional Comments: There is a large  pleural effusion in the left lateral region.  LEFT VENTRICLE PLAX 2D LVIDd:         4.40 cm LVIDs:         3.00 cm LV PW:         1.20 cm LV IVS:        0.90 cm LVOT diam:     2.20 cm LV SV:         57 LV SV Index:   30 LVOT Area:     3.80 cm  LV Volumes (MOD) LV vol d, MOD A2C: 91.2 ml LV vol d, MOD A4C: 66.2 ml LV vol s, MOD A2C: 42.9 ml LV vol s, MOD A4C: 27.7 ml LV SV MOD A2C:     48.3 ml LV SV MOD A4C:     66.2 ml LV SV MOD BP:      43.2 ml RIGHT VENTRICLE            IVC RV S prime:     9.14 cm/s  IVC diam: 2.40 cm TAPSE (M-mode): 1.5 cm LEFT ATRIUM           Index        RIGHT ATRIUM           Index LA diam:      4.60 cm 2.47 cm/m   RA Area:     15.80 cm LA Vol (A2C): 49.1 ml 26.35 ml/m  RA Volume:   37.10 ml  19.91 ml/m LA Vol (A4C): 46.6 ml 25.01 ml/m  AORTIC VALVE             PULMONIC VALVE LVOT Vmax:   88.35 cm/s  PR End Diast Vel: 1.27 msec LVOT Vmean:  59.600 cm/s LVOT VTI:    0.150 m  AORTA Ao Root diam: 3.20 cm Ao Asc diam:  3.70 cm MITRAL VALVE               TRICUSPID VALVE MV Area (PHT): 4.06 cm    TR Peak grad:   24.2 mmHg MV Decel Time: 187 msec    TR Vmax:        246.00 cm/s MV E velocity: 79.00 cm/s                            SHUNTS  Systemic VTI:  0.15 m                            Systemic Diam: 2.20 cm Rudean Haskell MD Electronically signed by Rudean Haskell MD Signature Date/Time: 10/01/2022/7:01:30 PM    Final    DG Chest Port 1 View  Result Date: 09/30/2022 CLINICAL DATA:  Shortness of breath. EXAM: PORTABLE CHEST 1 VIEW COMPARISON:  CT of the chest September 28, 2022. KUB and chest x-ray September 29, 2022. FINDINGS: Dilated stomach and bowel loops in the upper abdomen. The cardiomediastinal silhouette is stable. No pneumothorax. No nodules or masses. Bibasilar opacities consistent with multifocal pneumonia on the recent chest CT have not been well appreciated on chest x-rays but there is no significant change. A linear opacity in the right  mid lung is likely fluid in the fissure. IMPRESSION: 1. Bibasilar opacities consistent with multifocal pneumonia on the recent chest CT have not been well appreciated on chest x-rays but there is no significant change. 2. Dilated stomach and bowel loops in the upper abdomen. Recommend dedicated imaging of the abdomen. Electronically Signed   By: Dorise Bullion III M.D.   On: 09/30/2022 19:33   DG ABD ACUTE 2+V W 1V CHEST  Result Date: 09/29/2022 CLINICAL DATA:  Dyspnea and respiratory abnormality EXAM: DG ABDOMEN ACUTE WITH 1 VIEW CHEST COMPARISON:  Radiograph 09/28/2022 FINDINGS: Unchanged cardiomediastinal silhouette. There is bibasilar airspace disease, as seen on recent CT. No large effusion. No evidence of pneumothorax. No acute osseous abnormality. Thoracic spondylosis. Distension of bowel in the upper abdomen. IMPRESSION: Bibasilar airspace disease, as seen on recent CT, consistent with multifocal pneumonia/aspiration. Electronically Signed   By: Maurine Simmering M.D.   On: 09/29/2022 10:04   CT Angio Chest PE W/Cm &/Or Wo Cm  Result Date: 09/28/2022 CLINICAL DATA:  Two-week history of cough with acute onset nausea/vomiting EXAM: CT ANGIOGRAPHY CHEST WITH CONTRAST TECHNIQUE: Multidetector CT imaging of the chest was performed using the standard protocol during bolus administration of intravenous contrast. Multiplanar CT image reconstructions and MIPs were obtained to evaluate the vascular anatomy. RADIATION DOSE REDUCTION: This exam was performed according to the departmental dose-optimization program which includes automated exposure control, adjustment of the mA and/or kV according to patient size and/or use of iterative reconstruction technique. CONTRAST:  96m OMNIPAQUE IOHEXOL 350 MG/ML SOLN COMPARISON:  Chest radiograph dated 09/28/2022, CT abdomen and pelvis dated 04/11/2022 FINDINGS: Cardiovascular: The study is high quality for the evaluation of pulmonary embolism. There are no filling defects  in the central, lobar, segmental or subsegmental pulmonary artery branches to suggest acute pulmonary embolism. Great vessels are normal in course and caliber. Left atrial enlargement. No significant pericardial fluid/thickening. Coronary artery calcifications and aortic atherosclerosis. Mediastinum/Nodes: Thyroid gland without nodules meeting criteria for imaging follow-up by size. Patulous esophagus containing layering secretions. No pathologically enlarged axillary, supraclavicular, mediastinal, or hilar lymph nodes. Lungs/Pleura: The central airways are patent. Trace layering secretions within the trachea. Mild bronchial wall thickening. Ground-glass densities and consolidation involving the lower lobes bilaterally. Subsegmental atelectasis of the lingula and middle lobe with additional scattered ground-glass nodules in the right upper lobe. No pneumothorax. No pleural effusion. Upper abdomen: Subcentimeter hypodensities in the right hepatic lobe (5:92), too small to characterize but unchanged from prior examinations. Cholecystectomy. 2.2 cm peripherally calcified medial splenic cyst is again seen (5:90). Findings of prior bowel resection in the right upper quadrant. Musculoskeletal: No acute or abnormal lytic or blastic  osseous lesions. Review of the MIP images confirms the above findings. IMPRESSION: 1. No evidence of pulmonary embolism. 2. Ground-glass densities and consolidation involving the lower lobes bilaterally with additional scattered ground-glass nodules in the right upper lobe. Findings are suspicious for multifocal infection/aspiration. 3. Coronary artery calcifications. Aortic Atherosclerosis (ICD10-I70.0). Electronically Signed   By: Darrin Nipper M.D.   On: 09/28/2022 14:30   DG Chest 2 View  Result Date: 09/28/2022 CLINICAL DATA:  Dyspnea, nausea, vomiting EXAM: CHEST - 2 VIEW COMPARISON:  08/27/2022 FINDINGS: Normal heart size, mediastinal contours, and pulmonary vascularity. Minimal  bibasilar atelectasis. Lungs otherwise clear. No pulmonary infiltrate, pleural effusion, or pneumothorax. Bowel interposition between liver and diaphragm. No osseous abnormalities. IMPRESSION: Minimal bibasilar atelectasis. Electronically Signed   By: Lavonia Dana M.D.   On: 09/28/2022 09:30  [2 weeks]  Assessment:   [name] is an 88 y.o. year old male with past medical history of small bowel Crohn's disease status post resection complicated by recurrent stricture on Humira, C. difficile colitis, BPH, coronary artery disease, atrial fibrillation, GERD, hypothyroidism, partial SBO in June 2023 on CTE, presenting this admission with sepsis due to multifocal pneumonia, afib with RVR. GI consulted due to intractable emesis, hematemesis.    Hematemesis: Reporting multiple episodes of yellow emesis prior to admission, coffee-ground emesis noted in the ED and throughout hospitalization.  Hemoglobin 14.6 on admission, 11.32 days ago, down to 9.0 today.  No melena or hematochezia.  Last dose of Coumadin 10/01/2022.  INR down to 1.7 today.  NG tube in place with dark black/green return. Last EGD 2013.  Abdominal imaging 1215 with distention of bowel in the upper abdomen.  Repeat abdominal imaging yesterday with nonobstructive bowel gas pattern.  Differential diagnosis includes esophagitis, Mallory-Weiss tear, ulcer disease, less likely malignancy.  Query possibility of partial small bowel obstruction that precipitated vomiting.  Crohn's disease: Humira dose was due Saturday but unable to take as he was admitted.    Plan:   PPI twice daily. Remains NPO with NGT to low wall suction.  EGD when clinically stable from respiratory standpoint, likely no sooner than Thursday. We will continue to reassess.  Replete potassium, per attending.  Monitor H/H.    LOS: 5 days   Laureen Ochs. Bernarda Caffey [facility] [phone] 12/19/202310:02 AM

## 2022-10-03 NOTE — Progress Notes (Signed)
PROGRESS NOTE     [name], is a 88 y.o. male, DOB - 21-Mar-1931, HGD:[id number]  Admit date - 09/28/2022   Admitting Physician Bethena Roys, MD  Outpatient Primary MD for the patient is [name], [name], MD  LOS - 5  Chief Complaint  Patient presents with   Cough        Brief Narrative:   88 y.o. male with medical history significant for Atria Fibrillation, Crohn's, diastolic CHF, CAD admitted on 09/28/22 with multifocal pneumonia and A-fib with RVR    -Assessment and Plan: 1)Multifocal Pneumonia--- suspect aspiration component in the setting of recurrent emesis Admission CTA chest-multifocal pneumonia/aspiration -c/n IV ceftriaxone  -Completed azithromycin  -Speech therapy eval appreciated recommends full liquid diet for now -Continue bronchodilators and mucolytics -Leukocytosis has resolved - 2)Atrial fibrillation with RVR ([place]) --Continue to wean off IV Cardizem as able -Continue Coreg for rate control -Coumadin for stroke prophylaxis --INR trending down after vitamin K  3)Acute blood loss anemia secondary to acute GI bleed/intractable emesis --- -Baseline Hgb usually around 13-14 --No further coffee-ground emesis with NG tube in situ -continue IV PPI -INR trending down after vitamin K -GI consult appreciated -Defer timing of EGD to GI team -Abdominal x-rays on 10/02/2022 without obstructive findings -Antiemetics as needed -Hgb stable over the last 24 hours after initially dropping  4)Acute Hypoxic Respiratory Failure--- secondary to mostly #1 and #2 above -Try to wean off oxygen -Currently requiring 2 L via nasal cannula -Was not on oxygen prior to admission  5)Crohn's disease ([place]) Stable.  On Humira PTA -Will advise holding Humira given acute infection at this time  6)CAD (coronary artery disease) Follows with Dr. Domenic Polite.  Mild/moderate nonobstructive disease 2004.  No chest pain.  EKG unchanged. -Continue Coreg, no aspirin as patient is  already on Coumadin -Echo as below #8  7)Hypothyroidism-continue levothyroxine  8) Chronic diastolic heart failure (HCC) Stable and compensated.  Not on diuretics.   -Repeat echo on 10/01/2022 with EF of 50 to 55% which is similar to prior -Chest x-ray with small pleural effusion but no interstitial edema -General diuretics once oral intake is reliable  9) generalized weakness and deconditioning--- PT eval appreciated recommends SNF rehab  Status is: Inpatient   Disposition: The patient is from: Home              Anticipated d/c is to: SNF              Anticipated d/c date is: 3 days              Patient currently is not medically stable to d/c. Barriers: Not Clinically Stable-   Code Status :  -  Code Status: Full Code   Family Communication:    (patient is alert, awake and coherent)  -Discussed with his wife at bedside  DVT Prophylaxis  :   - SCDs /Coumadin     Lab Results  Component Value Date   PLT 232 10/03/2022   Inpatient Medications  Scheduled Meds:  bisacodyl  10 mg Rectal Daily   carvedilol  12.5 mg Per Tube BID   Chlorhexidine Gluconate Cloth  6 each Topical Daily   ipratropium  0.5 mg Nebulization Q6H   levalbuterol  0.63 mg Nebulization Q6H   levothyroxine  75 mcg Per Tube QAC breakfast   pantoprazole (PROTONIX) IV  40 mg Intravenous Q12H   polyethylene glycol  17 g Per Tube Daily   traZODone  50 mg Per Tube QHS  Continuous Infusions:  cefTRIAXone (ROCEPHIN)  IV Stopped (10/03/22 1128)   diltiazem (CARDIZEM) infusion 15 mg/hr (10/03/22 1856)   potassium chloride 10 mEq (10/03/22 2005)   PRN Meds:.acetaminophen **OR** acetaminophen, guaiFENesin-dextromethorphan, levalbuterol, ondansetron **OR** ondansetron (ZOFRAN) IV, prochlorperazine   Anti-infectives (From admission, onward)    Start     Dose/Rate Route Frequency Ordered Stop   10/03/22 1130  cefTRIAXone (ROCEPHIN) 1 g in sodium chloride 0.9 % 100 mL IVPB        1 g 200 mL/hr over 30 Minutes  Intravenous Every 24 hours 10/03/22 1015 10/06/22 1129   09/29/22 1500  cefTRIAXone (ROCEPHIN) 2 g in sodium chloride 0.9 % 100 mL IVPB        2 g 200 mL/hr over 30 Minutes Intravenous Every 24 hours 09/28/22 1900 10/02/22 1630   09/29/22 1500  azithromycin (ZITHROMAX) 500 mg in sodium chloride 0.9 % 250 mL IVPB  Status:  Discontinued        500 mg 250 mL/hr over 60 Minutes Intravenous Every 24 hours 09/28/22 1900 09/29/22 0849   09/29/22 1500  azithromycin (ZITHROMAX) 500 mg in sodium chloride 0.9 % 250 mL IVPB        500 mg 250 mL/hr over 60 Minutes Intravenous Every 24 hours 09/29/22 0849 10/02/22 1739   09/28/22 1515  cefTRIAXone (ROCEPHIN) 2 g in sodium chloride 0.9 % 100 mL IVPB        2 g 200 mL/hr over 30 Minutes Intravenous  Once 09/28/22 1500 09/28/22 1640   09/28/22 1515  azithromycin (ZITHROMAX) 500 mg in sodium chloride 0.9 % 250 mL IVPB        500 mg 250 mL/hr over 60 Minutes Intravenous  Once 09/28/22 1500 09/28/22 1837       Subjective: [name] today has no fevers,   No chest pain,  Wife at bedside, questions answered- -patient is willing to try liquid diet -No further emesis with NG tube in situ - - Objective: Vitals:   10/03/22 1800 10/03/22 1815 10/03/22 1930 10/03/22 2004  BP: (!) 161/79 (!) 136/58    Pulse: 71     Resp:  19    Temp:   98.1 F (36.7 C)   TempSrc:   Axillary   SpO2: 97% 98%  97%  Weight:      Height:        Intake/Output Summary (Last 24 hours) at 10/03/2022 2019 Last data filed at 10/03/2022 1619 Gross per 24 hour  Intake 566.19 ml  Output 975 ml  Net -408.81 ml   Filed Weights   10/01/22 0425 10/02/22 0500 10/03/22 0427  Weight: 73 kg 70.9 kg 69.8 kg   Physical Exam Gen:- Awake Alert,  in no apparent distress  HEENT:- [place].AT, No sclera icterus Nose- [place] 2L/min Nose- NG tube Ears- HOH Neck-Supple Neck,No JVD,.  Lungs-improving air movement, no significant wheezing  CV- S1, S2 normal, irregularly irregular abd-  +ve  B.Sounds, Abd Soft, No tenderness,    Extremity/Skin:- No  edema, pedal pulses present  Psych-affect is appropriate, oriented x3 Neuro-Generalized weakness, no new focal deficits, no tremors  Data Reviewed: I have personally reviewed following labs and imaging studies  CBC: Recent Labs  Lab 09/28/22 0906 09/29/22 0418 10/01/22 0422 10/02/22 1559 10/03/22 0311  WBC 11.2* 13.4* 11.3* 8.8 7.6  NEUTROABS 9.2*  --   --  5.4  --   HGB 14.6 13.2 11.3* 9.4* 9.0*  HCT 43.7 38.6* 32.8* 27.0* 26.1*  MCV 92.4 91.5 91.1 90.3  91.9  PLT 159 153 187 209 852   Basic Metabolic Panel: Recent Labs  Lab 09/28/22 0906 09/29/22 0418 10/01/22 0422 10/03/22 0311  NA 137 136 134* 139  K 4.2 3.5 3.7 3.0*  CL 101 102 104 109  CO2 28 25 21* 24  GLUCOSE 132* 129* 133* 97  BUN 18 19 54* 50*  CREATININE 1.49* 1.31* 1.61* 1.48*  CALCIUM 9.1 8.3* 8.2* 7.8*  MG  --  1.4* 2.5*  --   PHOS  --   --  2.5  --    GFR: Estimated Creatinine Clearance: 32.7 mL/min (A) (by C-G formula based on SCr of 1.48 mg/dL (H)). Liver Function Tests: Recent Labs  Lab 09/28/22 0906 10/01/22 0422 10/03/22 0311  AST 37  --  17  ALT 36  --  17  ALKPHOS 94  --  53  BILITOT 1.3*  --  0.6  PROT 6.9  --  4.8*  ALBUMIN 3.6 2.6* 2.3*   Recent Results (from the past 240 hour(s))  Resp panel by RT-PCR (RSV, Flu A&B, Covid) Anterior Nasal Swab     Status: None   Collection Time: 09/28/22  9:45 AM   Specimen: Anterior Nasal Swab  Result Value Ref Range Status   SARS Coronavirus 2 by RT PCR NEGATIVE NEGATIVE Final    Comment: (NOTE) SARS-CoV-2 target nucleic acids are NOT DETECTED.  The SARS-CoV-2 RNA is generally detectable in upper respiratory specimens during the acute phase of infection. The lowest concentration of SARS-CoV-2 viral copies this assay can detect is 138 copies/mL. A negative result does not preclude SARS-Cov-2 infection and should not be used as the sole basis for treatment or other patient management  decisions. A negative result may occur with  improper specimen collection/handling, submission of specimen other than nasopharyngeal swab, presence of viral mutation(s) within the areas targeted by this assay, and inadequate number of viral copies(<138 copies/mL). A negative result must be combined with clinical observations, patient history, and epidemiological information. The expected result is Negative.  Fact Sheet for Patients:  EntrepreneurPulse.com.au  Fact Sheet for Healthcare Providers:  IncredibleEmployment.be  This test is no t yet approved or cleared by the [place] FDA and  has been authorized for detection and/or diagnosis of SARS-CoV-2 by FDA under an Emergency Use Authorization (EUA). This EUA will remain  in effect (meaning this test can be used) for the duration of the COVID-19 declaration under Section 564(b)(1) of the Act, 21 U.S.C.section 360bbb-3(b)(1), unless the authorization is terminated  or revoked sooner.       Influenza A by PCR NEGATIVE NEGATIVE Final   Influenza B by PCR NEGATIVE NEGATIVE Final    Comment: (NOTE) The Xpert Xpress SARS-CoV-2/FLU/RSV plus assay is intended as an aid in the diagnosis of influenza from Nasopharyngeal swab specimens and should not be used as a sole basis for treatment. Nasal washings and aspirates are unacceptable for Xpert Xpress SARS-CoV-2/FLU/RSV testing.  Fact Sheet for Patients: EntrepreneurPulse.com.au  Fact Sheet for Healthcare Providers: IncredibleEmployment.be  This test is not yet approved or cleared by the [place] FDA and has been authorized for detection and/or diagnosis of SARS-CoV-2 by FDA under an Emergency Use Authorization (EUA). This EUA will remain in effect (meaning this test can be used) for the duration of the COVID-19 declaration under Section 564(b)(1) of the Act, 21 U.S.C. section 360bbb-3(b)(1), unless the  authorization is terminated or revoked.     Resp Syncytial Virus by PCR NEGATIVE NEGATIVE Final  Comment: (NOTE) Fact Sheet for Patients: EntrepreneurPulse.com.au  Fact Sheet for Healthcare Providers: IncredibleEmployment.be  This test is not yet approved or cleared by the [place] FDA and has been authorized for detection and/or diagnosis of SARS-CoV-2 by FDA under an Emergency Use Authorization (EUA). This EUA will remain in effect (meaning this test can be used) for the duration of the COVID-19 declaration under Section 564(b)(1) of the Act, 21 U.S.C. section 360bbb-3(b)(1), unless the authorization is terminated or revoked.  Performed at [facility], [address], [place], [place] [address]   Culture, blood (Routine X 2) w Reflex to ID Panel     Status: None   Collection Time: 09/28/22  5:20 PM   Specimen: BLOOD  Result Value Ref Range Status   Specimen Description BLOOD BLOOD RIGHT HAND  Final   Special Requests   Final    BOTTLES DRAWN AEROBIC AND ANAEROBIC Blood Culture adequate volume   Culture   Final    NO GROWTH 5 DAYS Performed at [facility], [address], [place], [place] [address]    Report Status 10/03/2022 FINAL  Final  Culture, blood (Routine X 2) w Reflex to ID Panel     Status: None   Collection Time: 09/28/22  5:20 PM   Specimen: BLOOD  Result Value Ref Range Status   Specimen Description BLOOD BLOOD LEFT HAND  Final   Special Requests   Final    BOTTLES DRAWN AEROBIC AND ANAEROBIC Blood Culture adequate volume   Culture   Final    NO GROWTH 5 DAYS Performed at [facility], [address] [place], [place] [address]    Report Status 10/03/2022 FINAL  Final  MRSA Next Gen by PCR, Nasal     Status: None   Collection Time: 09/28/22  7:01 PM   Specimen: Nasal Mucosa; Nasal Swab  Result Value Ref Range Status   MRSA by PCR Next Gen NOT DETECTED NOT DETECTED Final    Comment: (NOTE) The GeneXpert MRSA  Assay (FDA approved for NASAL specimens only), is one component of a comprehensive MRSA colonization surveillance program. It is not intended to diagnose MRSA infection nor to guide or monitor treatment for MRSA infections. Test performance is not FDA approved in patients less than 39 years old. Performed at [facility], [address] [place], [place] [address]      Radiology Studies: DG ABD ACUTE 2+V W 1V CHEST  Result Date: 10/02/2022 CLINICAL DATA:  Abdominal distension EXAM: DG ABDOMEN ACUTE WITH 1 VIEW CHEST COMPARISON:  Acute abdomen series dated 09/29/2022. FINDINGS: Single-view of the chest: Heart size and mediastinal contours are stable. LEFT basilar opacity, likely atelectasis and/or small pleural effusion. Lungs otherwise clear. No pneumothorax is seen. Supine and upright views of the abdomen: Enteric tube is stable in position, loosely coiled in the stomach. No dilated large or small bowel loops are seen. No evidence of free intraperitoneal air. IMPRESSION: 1. LEFT basilar opacity, likely mild atelectasis and/or small pleural effusion. 2. Nonobstructive bowel gas pattern. Enteric tube is stable in position, loosely coiled in the stomach. Electronically Signed   By: Franki Cabot M.D.   On: 10/02/2022 11:30   DG Chest 1 View  Result Date: 10/02/2022 CLINICAL DATA:  NG tube placement EXAM: CHEST  1 VIEW COMPARISON:  10/01/2022 FINDINGS: Small bilateral pleural effusions, right greater than left, mildly progressive. No frank interstitial edema. Heart is normal in size. Enteric tube coursing into the mid stomach. IMPRESSION: Small bilateral pleural effusions, right  greater than left, mildly progressive. Enteric tube coursing into the mid stomach. Electronically Signed   By: Julian Hy M.D.   On: 10/02/2022 00:56    Scheduled Meds:  bisacodyl  10 mg Rectal Daily   carvedilol  12.5 mg Per Tube BID   Chlorhexidine Gluconate Cloth  6 each Topical Daily   ipratropium  0.5 mg  Nebulization Q6H   levalbuterol  0.63 mg Nebulization Q6H   levothyroxine  75 mcg Per Tube QAC breakfast   pantoprazole (PROTONIX) IV  40 mg Intravenous Q12H   polyethylene glycol  17 g Per Tube Daily   traZODone  50 mg Per Tube QHS   Continuous Infusions:  cefTRIAXone (ROCEPHIN)  IV Stopped (10/03/22 1128)   diltiazem (CARDIZEM) infusion 15 mg/hr (10/03/22 1856)   potassium chloride 10 mEq (10/03/22 2005)    LOS: 5 days   Roxan Hockey M.D on 10/03/2022 at 8:19 PM  Go to [email or web address] - for contact info  [facility] - Office  [phone]  If 7PM-7AM, please contact night-coverage [email or web address] 10/03/2022, 8:19 PM

## 2022-10-03 NOTE — TOC Progression Note (Signed)
Transition of Care [facility]) - Progression Note    Patient Details  Name: [name] MRN: [id number] Date of Birth: [birth year]  [facility] CM/SW Contact  Ihor Gully, LCSW Phone Number: 10/03/2022, 5:07 PM  Clinical Narrative:    Discussed with Mrs. Barg PT's recommendation for SNF. She wants to discuss it with her daughter before making a decision about SNF vs. HH with DME.  TOC will follow up with Mrs. Allcorn to for decision.    Expected Discharge Plan: [place] Barriers to Discharge: Continued Medical Work up  Expected Discharge Plan and Services Expected Discharge Plan: [place] arrangements for the past 2 months: Single Family Home                                       Social Determinants of Health (SDOH) Interventions Housing Interventions: Intervention Not Indicated  Readmission Risk Interventions     No data to display

## 2022-10-04 ENCOUNTER — Inpatient Hospital Stay (HOSPITAL_COMMUNITY): Payer: PPO

## 2022-10-04 DIAGNOSIS — K50012 Crohn's disease of small intestine with intestinal obstruction: Secondary | ICD-10-CM

## 2022-10-04 DIAGNOSIS — D62 Acute posthemorrhagic anemia: Secondary | ICD-10-CM

## 2022-10-04 DIAGNOSIS — Z7901 Long term (current) use of anticoagulants: Secondary | ICD-10-CM

## 2022-10-04 DIAGNOSIS — J189 Pneumonia, unspecified organism: Secondary | ICD-10-CM | POA: Diagnosis not present

## 2022-10-04 DIAGNOSIS — I5032 Chronic diastolic (congestive) heart failure: Secondary | ICD-10-CM | POA: Diagnosis not present

## 2022-10-04 DIAGNOSIS — K920 Hematemesis: Secondary | ICD-10-CM | POA: Diagnosis not present

## 2022-10-04 DIAGNOSIS — I4891 Unspecified atrial fibrillation: Secondary | ICD-10-CM | POA: Diagnosis not present

## 2022-10-04 LAB — CBC WITH DIFFERENTIAL/PLATELET
Abs Immature Granulocytes: 0.27 10*3/uL — ABNORMAL HIGH (ref 0.00–0.07)
Basophils Absolute: 0 10*3/uL (ref 0.0–0.1)
Basophils Relative: 0 %
Eosinophils Absolute: 0.3 10*3/uL (ref 0.0–0.5)
Eosinophils Relative: 5 %
HCT: 27 % — ABNORMAL LOW (ref 39.0–52.0)
Hemoglobin: 9.1 g/dL — ABNORMAL LOW (ref 13.0–17.0)
Immature Granulocytes: 4 %
Lymphocytes Relative: 30 %
Lymphs Abs: 2.1 10*3/uL (ref 0.7–4.0)
MCH: 31.6 pg (ref 26.0–34.0)
MCHC: 33.7 g/dL (ref 30.0–36.0)
MCV: 93.8 fL (ref 80.0–100.0)
Monocytes Absolute: 1.3 10*3/uL — ABNORMAL HIGH (ref 0.1–1.0)
Monocytes Relative: 18 %
Neutro Abs: 3 10*3/uL (ref 1.7–7.7)
Neutrophils Relative %: 43 %
Platelets: 271 10*3/uL (ref 150–400)
RBC: 2.88 MIL/uL — ABNORMAL LOW (ref 4.22–5.81)
RDW: 15.4 % (ref 11.5–15.5)
WBC: 7.1 10*3/uL (ref 4.0–10.5)
nRBC: 0.3 % — ABNORMAL HIGH (ref 0.0–0.2)

## 2022-10-04 LAB — RENAL FUNCTION PANEL
Albumin: 2.2 g/dL — ABNORMAL LOW (ref 3.5–5.0)
Anion gap: 5 (ref 5–15)
BUN: 27 mg/dL — ABNORMAL HIGH (ref 8–23)
CO2: 24 mmol/L (ref 22–32)
Calcium: 7.8 mg/dL — ABNORMAL LOW (ref 8.9–10.3)
Chloride: 111 mmol/L (ref 98–111)
Creatinine, Ser: 1.19 mg/dL (ref 0.61–1.24)
GFR, Estimated: 58 mL/min — ABNORMAL LOW (ref >60)
Glucose, Bld: 96 mg/dL (ref 70–99)
Phosphorus: 1.8 mg/dL — ABNORMAL LOW (ref 2.5–4.6)
Potassium: 3.8 mmol/L (ref 3.5–5.1)
Sodium: 140 mmol/L (ref 135–145)

## 2022-10-04 LAB — PROTIME-INR
INR: 1.4 — ABNORMAL HIGH (ref 0.8–1.2)
Prothrombin Time: 16.9 seconds — ABNORMAL HIGH (ref 11.4–15.2)

## 2022-10-04 LAB — GLUCOSE, CAPILLARY
Glucose-Capillary: 105 mg/dL — ABNORMAL HIGH (ref 70–99)
Glucose-Capillary: 78 mg/dL (ref 70–99)
Glucose-Capillary: 99 mg/dL (ref 70–99)
Glucose-Capillary: 102 mg/dL — ABNORMAL HIGH (ref 70–99)
Glucose-Capillary: 97 mg/dL (ref 70–99)

## 2022-10-04 MED ORDER — IPRATROPIUM BROMIDE 0.02 % IN SOLN
0.5000 mg | Freq: Three times a day (TID) | RESPIRATORY_TRACT | Status: DC
  Administered 2022-10-04 – 2022-10-07 (×11): 0.5 mg via RESPIRATORY_TRACT
  Filled 2022-10-04 (×11): qty 2.5

## 2022-10-04 MED ORDER — LEVALBUTEROL HCL 0.63 MG/3ML IN NEBU
0.6300 mg | INHALATION_SOLUTION | Freq: Three times a day (TID) | RESPIRATORY_TRACT | Status: DC
  Administered 2022-10-04 – 2022-10-07 (×11): 0.63 mg via RESPIRATORY_TRACT
  Filled 2022-10-04 (×11): qty 3

## 2022-10-04 NOTE — Progress Notes (Signed)
PROGRESS NOTE     [name], is a 88 y.o. male, DOB - 1931-05-18, LTJ:[id number]  Admit date - 09/28/2022   Admitting Physician Bethena Roys, MD  Outpatient Primary MD for the patient is [name], [name], MD  LOS - 6  Chief Complaint  Patient presents with   Cough        Brief Narrative:   88 y.o. male with medical history significant for Atria Fibrillation, Crohn's, diastolic CHF, CAD admitted on 09/28/22 with multifocal pneumonia and A-fib with RVR    -Assessment and Plan: 1)Multifocal Pneumonia--- suspect aspiration component in the setting of recurrent emesis Admission CTA chest-multifocal pneumonia/aspiration -c/n treatment with IV ceftriaxone  -Patient has completed azithromycin  -Following recommendations by speech therapy will continue full liquid diet currently; n.p.o. after midnight for anticipated EGD on 10/05/2022. -Continue bronchodilators and mucolytics -Leukocytosis has resolved  2)Atrial fibrillation with RVR ([place]) --Continue to wean off IV Cardizem as able -Continue the use of Coreg. -Coumadin per pharmacy for stroke prophylaxis --INR trending down after vitamin K  3)Acute blood loss anemia secondary to acute GI bleed/intractable emesis --- -Baseline Hgb usually around 13-14 --No further coffee-ground emesis with NG tube in situ -continue IV PPI -INR trending down appropriately after vitamin K -Abdominal x-rays on 10/02/2022 without obstructive findings -Antiemetics as needed -Hgb stable over the last 24 hours after initially dropping. -After discussing with GI service plan is for NG tube removal and endoscopy evaluation most likely on 10/05/2022.  4)Acute Hypoxic Respiratory Failure--- secondary to mostly #1 and #2 above -Patient required 2 L nasal cannula supplementation; has been able to be weaned off to room air currently. -Continue monitoring.  5)Crohn's disease ([place]) Stable.  On Humira PTA -Will advise holding Humira given acute  infection at this time  6)CAD (coronary artery disease) Follows with Dr. Domenic [name].  Mild/moderate nonobstructive disease 2004.  No chest pain.  EKG unchanged. -Continue Coreg, no aspirin as patient is already on Coumadin -Echo as below #8  7)Hypothyroidism-continue levothyroxine  8) Chronic diastolic heart failure (HCC) Stable and compensated.  Not on diuretics.   -Repeat echo on 10/01/2022 with EF of 50 to 55% which is similar to prior -Chest x-ray with small pleural effusion but no interstitial edema -General diuretics once oral intake is reliable  9) generalized weakness and deconditioning--- PT eval appreciated recommends SNF rehab  Status is: Inpatient   Disposition: The patient is from: Home              Anticipated d/c is to: SNF              Anticipated d/c date is: 3 days              Patient currently is not medically stable to d/c. Barriers: Not Clinically Stable-   Code Status :  -  Code Status: Full Code   Family Communication:    (patient is alert, awake and coherent)  -Discussed with his wife at bedside  DVT Prophylaxis  :   - SCDs /Coumadin     Lab Results  Component Value Date   PLT 271 10/04/2022   Inpatient Medications  Scheduled Meds:  carvedilol  12.5 mg Per Tube BID   Chlorhexidine Gluconate Cloth  6 each Topical Daily   ipratropium  0.5 mg Nebulization TID   levalbuterol  0.63 mg Nebulization TID   levothyroxine  75 mcg Per Tube QAC breakfast   pantoprazole (PROTONIX) IV  40 mg Intravenous Q12H   polyethylene glycol  17 g Per Tube Daily   traZODone  50 mg Per Tube QHS   Continuous Infusions:  cefTRIAXone (ROCEPHIN)  IV 1 g (10/04/22 1203)   diltiazem (CARDIZEM) infusion 15 mg/hr (10/04/22 1204)   PRN Meds:.acetaminophen **OR** acetaminophen, guaiFENesin-dextromethorphan, levalbuterol, ondansetron **OR** ondansetron (ZOFRAN) IV, prochlorperazine   Anti-infectives (From admission, onward)    Start     Dose/Rate Route Frequency Ordered  Stop   10/03/22 1130  cefTRIAXone (ROCEPHIN) 1 g in sodium chloride 0.9 % 100 mL IVPB        1 g 200 mL/hr over 30 Minutes Intravenous Every 24 hours 10/03/22 1015 10/06/22 1129   09/29/22 1500  cefTRIAXone (ROCEPHIN) 2 g in sodium chloride 0.9 % 100 mL IVPB        2 g 200 mL/hr over 30 Minutes Intravenous Every 24 hours 09/28/22 1900 10/02/22 1630   09/29/22 1500  azithromycin (ZITHROMAX) 500 mg in sodium chloride 0.9 % 250 mL IVPB  Status:  Discontinued        500 mg 250 mL/hr over 60 Minutes Intravenous Every 24 hours 09/28/22 1900 09/29/22 0849   09/29/22 1500  azithromycin (ZITHROMAX) 500 mg in sodium chloride 0.9 % 250 mL IVPB        500 mg 250 mL/hr over 60 Minutes Intravenous Every 24 hours 09/29/22 0849 10/02/22 1739   09/28/22 1515  cefTRIAXone (ROCEPHIN) 2 g in sodium chloride 0.9 % 100 mL IVPB        2 g 200 mL/hr over 30 Minutes Intravenous  Once 09/28/22 1500 09/28/22 1640   09/28/22 1515  azithromycin (ZITHROMAX) 500 mg in sodium chloride 0.9 % 250 mL IVPB        500 mg 250 mL/hr over 60 Minutes Intravenous  Once 09/28/22 1500 09/28/22 1837       Subjective: [name] afebrile, no chest pain, no nausea or vomiting reported.  NG tube is still in place; good saturation on room air.  Rate appears to be better controlled when receiving Cardizem drip.  Tolerating clear liquids. - Objective: Vitals:   10/04/22 1335 10/04/22 1400 10/04/22 1500 10/04/22 1600  BP:  (!) 164/75 (!) 140/62 136/87  Pulse:   (!) 108   Resp:  (!) 21 (!) 22 (!) 24  Temp:      TempSrc:      SpO2: 97% 96% 95% 96%  Weight:      Height:        Intake/Output Summary (Last 24 hours) at 10/04/2022 1708 Last data filed at 10/04/2022 1420 Gross per 24 hour  Intake 936.07 ml  Output 350 ml  Net 586.07 ml   Filed Weights   10/02/22 0500 10/03/22 0427 10/04/22 0407  Weight: 70.9 kg 69.8 kg 71.5 kg   Physical Exam General exam: Alert, awake, oriented x 3; no chest pain, currently without any  nausea or vomiting.  Tolerating liquid diet.  Afebrile.  No requiring oxygen supplementation. Respiratory system: Good air movement bilaterally; no using accessory muscles.  Good saturation on room air. Cardiovascular system: Rate controlled for the most part while receiving Cardizem drip.  Irregular breathing appreciated .  Positive soft murmur appreciated on examination; no rubs or gallops. Gastrointestinal system: Soft, nontender, nondistended, positive bowel sounds. Central nervous system: Alert and oriented. No focal neurological deficits. Extremities: No cyanosis or clubbing. Skin: No petechiae. Psychiatry: Judgement and insight appear normal. Mood & affect appropriate.   Data Reviewed: I have personally reviewed following labs and imaging studies  CBC: Recent Labs  Lab 09/28/22 0906 09/29/22 0418 10/01/22 0422 10/02/22 1559 10/03/22 0311 10/04/22 0307  WBC 11.2* 13.4* 11.3* 8.8 7.6 7.1  NEUTROABS 9.2*  --   --  5.4  --  3.0  HGB 14.6 13.2 11.3* 9.4* 9.0* 9.1*  HCT 43.7 38.6* 32.8* 27.0* 26.1* 27.0*  MCV 92.4 91.5 91.1 90.3 91.9 93.8  PLT 159 153 187 209 232 283   Basic Metabolic Panel: Recent Labs  Lab 09/28/22 0906 09/29/22 0418 10/01/22 0422 10/03/22 0311 10/04/22 0307  NA 137 136 134* 139 140  K 4.2 3.5 3.7 3.0* 3.8  CL 101 102 104 109 111  CO2 28 25 21* 24 24  GLUCOSE 132* 129* 133* 97 96  BUN 18 19 54* 50* 27*  CREATININE 1.49* 1.31* 1.61* 1.48* 1.19  CALCIUM 9.1 8.3* 8.2* 7.8* 7.8*  MG  --  1.4* 2.5*  --   --   PHOS  --   --  2.5  --  1.8*   GFR: Estimated Creatinine Clearance: 40.7 mL/min (by C-G formula based on SCr of 1.19 mg/dL). Liver Function Tests: Recent Labs  Lab 09/28/22 0906 10/01/22 0422 10/03/22 0311 10/04/22 0307  AST 37  --  17  --   ALT 36  --  17  --   ALKPHOS 94  --  53  --   BILITOT 1.3*  --  0.6  --   PROT 6.9  --  4.8*  --   ALBUMIN 3.6 2.6* 2.3* 2.2*   Recent Results (from the past 240 hour(s))  Resp panel by RT-PCR  (RSV, Flu A&B, Covid) Anterior Nasal Swab     Status: None   Collection Time: 09/28/22  9:45 AM   Specimen: Anterior Nasal Swab  Result Value Ref Range Status   SARS Coronavirus 2 by RT PCR NEGATIVE NEGATIVE Final    Comment: (NOTE) SARS-CoV-2 target nucleic acids are NOT DETECTED.  The SARS-CoV-2 RNA is generally detectable in upper respiratory specimens during the acute phase of infection. The lowest concentration of SARS-CoV-2 viral copies this assay can detect is 138 copies/mL. A negative result does not preclude SARS-Cov-2 infection and should not be used as the sole basis for treatment or other patient management decisions. A negative result may occur with  improper specimen collection/handling, submission of specimen other than nasopharyngeal swab, presence of viral mutation(s) within the areas targeted by this assay, and inadequate number of viral copies(<138 copies/mL). A negative result must be combined with clinical observations, patient history, and epidemiological information. The expected result is Negative.  Fact Sheet for Patients:  EntrepreneurPulse.com.au  Fact Sheet for Healthcare Providers:  IncredibleEmployment.be  This test is no t yet approved or cleared by the [place] FDA and  has been authorized for detection and/or diagnosis of SARS-CoV-2 by FDA under an Emergency Use Authorization (EUA). This EUA will remain  in effect (meaning this test can be used) for the duration of the COVID-19 declaration under Section 564(b)(1) of the Act, 21 U.S.C.section 360bbb-3(b)(1), unless the authorization is terminated  or revoked sooner.       Influenza A by PCR NEGATIVE NEGATIVE Final   Influenza B by PCR NEGATIVE NEGATIVE Final    Comment: (NOTE) The Xpert Xpress SARS-CoV-2/FLU/RSV plus assay is intended as an aid in the diagnosis of influenza from Nasopharyngeal swab specimens and should not be used as a sole basis for  treatment. Nasal washings and aspirates are unacceptable for Xpert Xpress SARS-CoV-2/FLU/RSV testing.  Fact Sheet for Patients: EntrepreneurPulse.com.au  Fact Sheet for Healthcare Providers: IncredibleEmployment.be  This test is not yet approved or cleared by the [place] FDA and has been authorized for detection and/or diagnosis of SARS-CoV-2 by FDA under an Emergency Use Authorization (EUA). This EUA will remain in effect (meaning this test can be used) for the duration of the COVID-19 declaration under Section 564(b)(1) of the Act, 21 U.S.C. section 360bbb-3(b)(1), unless the authorization is terminated or revoked.     Resp Syncytial Virus by PCR NEGATIVE NEGATIVE Final    Comment: (NOTE) Fact Sheet for Patients: EntrepreneurPulse.com.au  Fact Sheet for Healthcare Providers: IncredibleEmployment.be  This test is not yet approved or cleared by the [place] FDA and has been authorized for detection and/or diagnosis of SARS-CoV-2 by FDA under an Emergency Use Authorization (EUA). This EUA will remain in effect (meaning this test can be used) for the duration of the COVID-19 declaration under Section 564(b)(1) of the Act, 21 U.S.C. section 360bbb-3(b)(1), unless the authorization is terminated or revoked.  Performed at [facility], [address] [place], [place] [address]   Culture, blood (Routine X 2) w Reflex to ID Panel     Status: None   Collection Time: 09/28/22  5:20 PM   Specimen: BLOOD  Result Value Ref Range Status   Specimen Description BLOOD BLOOD RIGHT HAND  Final   Special Requests   Final    BOTTLES DRAWN AEROBIC AND ANAEROBIC Blood Culture adequate volume   Culture   Final    NO GROWTH 5 DAYS Performed at [facility], [address] [place], [place] [address]    Report Status 10/03/2022 FINAL  Final  Culture, blood (Routine X 2) w Reflex to ID Panel     Status: None    Collection Time: 09/28/22  5:20 PM   Specimen: BLOOD  Result Value Ref Range Status   Specimen Description BLOOD BLOOD LEFT HAND  Final   Special Requests   Final    BOTTLES DRAWN AEROBIC AND ANAEROBIC Blood Culture adequate volume   Culture   Final    NO GROWTH 5 DAYS Performed at [facility], [address] [place], [place] [address]    Report Status 10/03/2022 FINAL  Final  MRSA Next Gen by PCR, Nasal     Status: None   Collection Time: 09/28/22  7:01 PM   Specimen: Nasal Mucosa; Nasal Swab  Result Value Ref Range Status   MRSA by PCR Next Gen NOT DETECTED NOT DETECTED Final    Comment: (NOTE) The GeneXpert MRSA Assay (FDA approved for NASAL specimens only), is one component of a comprehensive MRSA colonization surveillance program. It is not intended to diagnose MRSA infection nor to guide or monitor treatment for MRSA infections. Test performance is not FDA approved in patients less than 12 years old. Performed at [facility], [address] [place],  [address]      Radiology Studies: DG ABD ACUTE 2+V W 1V CHEST  Result Date: 10/04/2022 CLINICAL DATA:  Emesis EXAM: DG ABDOMEN ACUTE WITH 1 VIEW CHEST COMPARISON:  10/02/2022 FINDINGS: There is no evidence of dilated bowel loops or free intraperitoneal air. No radiopaque calculi or other significant radiographic abnormality is seen. Bibasilar pulmonary parenchymal consolidation. Bilateral pleural effusion. Mild vascular congestion. Calcified aorta. NG tube tip is superimposed with the stomach. IMPRESSION: Nonspecific bowel gas pattern. No free air. Bibasilar pulmonary parenchymal consolidation and pleural effusions. Electronically Signed   By: Sammie Bench M.D.   On: 10/04/2022 10:48    Scheduled  Meds:  carvedilol  12.5 mg Per Tube BID   Chlorhexidine Gluconate Cloth  6 each Topical Daily   ipratropium  0.5 mg Nebulization TID   levalbuterol  0.63 mg Nebulization TID   levothyroxine  75 mcg Per Tube QAC breakfast    pantoprazole (PROTONIX) IV  40 mg Intravenous Q12H   polyethylene glycol  17 g Per Tube Daily   traZODone  50 mg Per Tube QHS   Continuous Infusions:  cefTRIAXone (ROCEPHIN)  IV 1 g (10/04/22 1203)   diltiazem (CARDIZEM) infusion 15 mg/hr (10/04/22 1204)    LOS: 6 days   Barton Dubois M.D on 10/04/2022 at 5:08 PM  Go to [email or web address] - for contact info  [facility] - Office  [phone]  If 7PM-7AM, please contact night-coverage [email or web address] 10/04/2022, 5:08 PM

## 2022-10-04 NOTE — Progress Notes (Signed)
Subjective: Wife at bedside, patient more alert today. NG tube remains in place. Wife states he removed NG tube this morning and staff had to replace it, patient with safety mitts on at this time. He ate some pudding,ice cream and grits today and did okay with these. He has had a BM this morning. Not requiring supplemental O2 today but still with a lot of congestion and cough. O2 sat around 95% during assessment.  Objective: Vital signs in last 24 hours: Temp:  [97.8 F (36.6 C)-98.7 F (37.1 C)] 98.3 F (36.8 C) (12/20 0407) Pulse Rate:  [44-125] 92 (12/20 0600) Resp:  [18-25] 21 (12/20 0600) BP: (121-164)/(30-88) 139/56 (12/20 0600) SpO2:  [87 %-99 %] 99 % (12/20 0727) Weight:  [71.5 kg] 71.5 kg (12/20 0407) Last BM Date : 10/03/22 General:   Alert and oriented, pleasant Head:  Normocephalic and atraumatic. Eyes/Nose:  No icterus, sclera clear. Conjuctiva pink. NG tube remains in place Mouth:  Without lesions, mucosa pink and moist.  Heart:  S1, S2 present, no murmurs noted.  Lungs: Clear to auscultation bilaterally, without wheezing, rales, or rhonchi.  Abdomen:  Bowel sounds present, soft, non-tender, non-distended. No HSM or hernias noted. No rebound or guarding. No masses appreciated  Msk:  Symmetrical without gross deformities. Normal posture. Pulses:  Normal pulses noted. Extremities:  Without clubbing or edema. Neurologic:  Alert and  oriented x4;  grossly normal neurologically. Skin:  Warm and dry, intact without significant lesions.  Psych:  Alert and cooperative. Normal mood and affect.  Intake/Output from previous day: 12/19 0701 - 12/20 0700 In: 566.2 [I.V.:63.6; IV Piggyback:502.6] Out: -  Intake/Output this shift: Total I/O In: 407.8 [IV Piggyback:407.8] Out: -   Lab Results: Recent Labs    10/02/22 1559 10/03/22 0311 10/04/22 0307  WBC 8.8 7.6 7.1  HGB 9.4* 9.0* 9.1*  HCT 27.0* 26.1* 27.0*  PLT 209 232 271   BMET Recent Labs    10/03/22 0311  10/04/22 0307  NA 139 140  K 3.0* 3.8  CL 109 111  CO2 24 24  GLUCOSE 97 96  BUN 50* 27*  CREATININE 1.48* 1.19  CALCIUM 7.8* 7.8*   LFT Recent Labs    10/03/22 0311 10/04/22 0307  PROT 4.8*  --   ALBUMIN 2.3* 2.2*  AST 17  --   ALT 17  --   ALKPHOS 53  --   BILITOT 0.6  --    PT/INR Recent Labs    10/03/22 0311 10/04/22 0307  LABPROT 19.7* 16.9*  INR 1.7* 1.4*   Studies/Results: DG ABD ACUTE 2+V W 1V CHEST  Result Date: 10/02/2022 CLINICAL DATA:  Abdominal distension EXAM: DG ABDOMEN ACUTE WITH 1 VIEW CHEST COMPARISON:  Acute abdomen series dated 09/29/2022. FINDINGS: Single-view of the chest: Heart size and mediastinal contours are stable. LEFT basilar opacity, likely atelectasis and/or small pleural effusion. Lungs otherwise clear. No pneumothorax is seen. Supine and upright views of the abdomen: Enteric tube is stable in position, loosely coiled in the stomach. No dilated large or small bowel loops are seen. No evidence of free intraperitoneal air. IMPRESSION: 1. LEFT basilar opacity, likely mild atelectasis and/or small pleural effusion. 2. Nonobstructive bowel gas pattern. Enteric tube is stable in position, loosely coiled in the stomach. Electronically Signed   By: Franki Cabot M.D.   On: 10/02/2022 11:30    Assessment: [name] is an 88 year old male with PMH of small bowel Crohn's disease status post resection complicated by recurrent  stricture on Humira, C. difficile colitis, BPH, coronary artery disease, atrial fibrillation, GERD, hypothyroidism, partial SBO in June 2023 on CTE, presenting this admission with sepsis due to multifocal pneumonia, afib with RVR. GI consulted due to intractable emesis, hematemesis.    Hematemesis: Reporting multiple episodes of yellow emesis prior to admission, coffee-ground emesis noted in ED and throughout hospitalization. NG tube in place with dark black/green return. Hemoglobin 14.6 on admission, 9.1 today. No melena or  hematochezia.  Last dose of Coumadin 10/01/2022.  INR down to 1.4 today after vitamin K.  Last EGD 2013.  Abdominal imaging 12/15 with distention of bowel in the upper abdomen.  Repeat abdominal imaging Monday with nonobstructive bowel gas pattern, similar appearance on xray today as well. Differential diagnosis includes esophagitis, Mallory-Weiss tear, PUD, less likely malignancy.  Query possibility of partial small bowel obstruction that precipitated vomiting, however he was able to eat this morning, recommend removal of NG tube.   Will need EGD once clinically stable from cardiac and respiratory stand point, as he is more alert and off supplemental O2. Will make NPO at midnight, reassess in the morning for potential EGD tomorrow.   Crohn's disease: maintained on Humira, last dose due Saturday but missed due to admission.    Plan: PPI BID NPO at midnight Recommend NG tube removal EGD potentially tomorrow, reassess in the morning Potassium repletion per hospitalist Trend h&h, transfuse for hgb <7 Anti emetics as needed per hospitalist   LOS: 6 days    10/04/2022, 9:34 AM  Bentlie Withem L. Alver Sorrow, MSN, APRN, AGNP-C Adult-Gerontology Nurse Practitioner [facility] Gastroenterology at [facility]

## 2022-10-04 NOTE — TOC Progression Note (Signed)
Transition of Care [facility]) - Progression Note    Patient Details  Name: [name] MRN: [id number] Date of Birth: 1931-06-15  [facility] CM/SW Contact  Ihor Gully, LCSW Phone Number: 10/04/2022, 2:54 PM  Clinical Narrative:    Attempted to engage [name] about d/c plan and SNF. She indicated that she was not up to having the conversation as she and patient did not sleep well last night. [name] indicated that patient is scheduled to have a procedure tomorrow and she will know more after the procedure. States that she will speak about it on Friday. TOC attempted to express that a d/c plan needed to made and [name] reiterated that she was just not up to discussion today.    Planned Disposition: Home Barriers to Discharge: Continued Medical Work up  Expected Discharge Plan and Services       Living arrangements for the past 2 months: [place] Determinants of Health (SDOH) Interventions SDOH Screenings   Food Insecurity: No Food Insecurity (10/02/2022)  Housing: Low Risk  (10/02/2022)  Transportation Needs: No Transportation Needs (10/02/2022)  Utilities: Not At Risk (10/02/2022)  Tobacco Use: Low Risk  (09/28/2022)    Readmission Risk Interventions     No data to display

## 2022-10-04 NOTE — Progress Notes (Signed)
Speech Language Pathology Treatment: Dysphagia  Patient Details Name: [name] MRN: [id number] DOB: [birth year] Today's Date: 10/04/2022 Time: 1200-1222 SLP Time Calculation (min) (ACUTE ONLY): 22 min  Assessment / Plan / Recommendation Clinical Impression  Pt was seen for ongoing diagnostic dysphagia therapy. Pt was sitting upright in bed with full liquid lunch tray; Pt had consumed a cup of pudding and majority of tray provided. Pt consumed sips of thin liquids with a noted 2 minute delayed wet/congested cough and throat clear. Continues to demonstrate no overt s/sx of aspiration however, Pt is at risk d/t deconditioning, hx of GERD and esophageal symptoms. GI is continuing to follow - defer to GI for diet advancement, assessment and treatment of esophageal dysphagia. Reinforced strategies re: small sip and very slow rate. ST will continue to follow,    HPI HPI: [name] is a 88 y.o. male with medical history significant for Atria Fibrillation, Crohn's, diastolic CHF, CAD.  Patient presented to the ED with complaints of worsening cough over the past 2 weeks, this morning he had at least 5 episodes of vomiting was lightheaded and came to the ED.  Reports earlier into his symptoms, he had a chest x-ray done by his outpatient provider and it did not show a chest x-ray, he was prescribed Mucinex.  He denies difficulty breathing;12/18 CXR indicated Small bilateral pleural effusions, right greater than left, mildly  progressive.     Enteric tube coursing into the mid stomach; BSE generated 12/14, but pt unable to complete until this date d/t pain/decreased alertness and vomiting.      SLP Plan  Continue with current plan of care      Recommendations for follow up therapy are one component of a multi-disciplinary discharge planning process, led by the attending physician.  Recommendations may be updated based on patient status, additional functional criteria and insurance authorization.     Recommendations  Diet recommendations: Thin liquid Liquids provided via: Cup;Straw Medication Administration: Other (Comment) Supervision: Patient able to self feed;Staff to assist with self feeding Compensations: Slow rate;Small sips/bites                Oral Care Recommendations: Oral care BID;Staff/trained caregiver to provide oral care Follow Up Recommendations: Follow physician's recommendations for discharge plan and follow up therapies SLP Visit Diagnosis: Dysphagia, pharyngoesophageal phase (R13.14) Plan: Continue with current plan of care          Annie Saephan H. Roddie Mc, CCC-SLP Speech Language Pathologist  Wende Bushy  10/04/2022, 12:23 PM

## 2022-10-04 NOTE — [place] FL2 (Signed)
[place] MEDICAID FL2 LEVEL OF CARE FORM     IDENTIFICATION  Patient Name: [name] Birthdate: 12-31-1930 Sex: male Admission Date (Current Location): 09/28/2022  [facility] [place] and [place] Number:  [employer] and Address:  [place] [address], [place]      Provider Number: [id number]  Attending Physician Name and Address:  Barton Dubois, MD  Relative Name and Phone Number:  [place] (Spouse) [phone]    Current Level of Care: Hospital Recommended Level of Care: [place] Prior Approval Number:    Date Approved/Denied:   PASRR Number: [id number]  Discharge Plan: SNF    Current Diagnoses: Patient Active Problem List   Diagnosis Date Noted   Hematemesis 10/03/2022   Multifocal pneumonia 09/28/2022   Sepsis ([place]) 09/28/2022   Small bowel stricture ([place]) 06/01/2022   Weight loss 02/28/2022   Abnormal defecation 12/30/2020   Clostridioides difficile diarrhea 12/02/2020   Hoarseness 09/14/2020   Enlarged prostate with urinary obstruction 04/29/2020   Chronic diastolic heart failure ([place]) 02/17/2020   Constipation 11/11/2019   Crohn's disease ([place]) 09/25/2019   Flatulence 09/25/2019   Shortness of breath 05/06/2015   Hip pain 07/27/2014   Upper airway cough syndrome 03/22/2014   Restrictive lung disease 03/22/2014   Encounter for therapeutic drug monitoring 12/26/2013   Hx SBO 09/18/2011   Carotid bruit    Fatigue    CAD (coronary artery disease)    Atrial fibrillation with RVR (HCC)    GERD (gastroesophageal reflux disease)    Clot    Mitral regurgitation    Warfarin anticoagulation    Dizziness 03/16/2010   Nausea vomiting and diarrhea 01/14/2010   Ejection fraction 02/13/2009    Orientation RESPIRATION BLADDER Height & Weight     Self, Situation, Place  Normal Incontinent Weight: 157 lb 10.1 oz (71.5 kg) Height:  5' 8"  (172.7 cm)  BEHAVIORAL SYMPTOMS/MOOD NEUROLOGICAL BOWEL NUTRITION  STATUS        Diet (see d/c summary)  AMBULATORY STATUS COMMUNICATION OF NEEDS Skin   Limited Assist Verbally Normal                       Personal Care Assistance Level of Assistance  Bathing, Feeding, Dressing Bathing Assistance: Limited assistance Feeding assistance: Independent Dressing Assistance: Limited assistance     Functional Limitations Info  Sight, Hearing, Speech Sight Info: Adequate Hearing Info: Adequate Speech Info: Adequate    SPECIAL CARE FACTORS FREQUENCY  PT (By licensed PT)     PT Frequency: 5x/week              Contractures Contractures Info: Not present    Additional Factors Info  Code Status, Allergies, Psychotropic Code Status Info: Full Code Allergies Info: NKA Psychotropic Info: n/a         Current Medications (10/04/2022):  This is the current hospital active medication list Current Facility-Administered Medications  Medication Dose Route Frequency Provider Last Rate Last Admin   acetaminophen (TYLENOL) tablet 650 mg  650 mg Per Tube Q6H PRN Madueme, Elvira C, RPH       Or   acetaminophen (TYLENOL) suppository 650 mg  650 mg Rectal Q6H PRN Madueme, Elvira C, RPH       carvedilol (COREG) tablet 12.5 mg  12.5 mg Per Tube BID Emokpae, Courage, MD   12.5 mg at 10/04/22 0808   cefTRIAXone (ROCEPHIN) 1 g in sodium chloride 0.9 % 100 mL IVPB  1 g Intravenous  Q24H Roxan Hockey, MD 200 mL/hr at 10/04/22 1203 1 g at 10/04/22 1203   Chlorhexidine Gluconate Cloth 2 % PADS 6 each  6 each Topical Daily Emokpae, Ejiroghene E, MD   6 each at 10/04/22 0825   diltiazem (CARDIZEM) 125 mg in dextrose 5% 125 mL (1 mg/mL) infusion  5-15 mg/hr Intravenous Continuous Emokpae, Courage, MD 15 mL/hr at 10/04/22 1204 15 mg/hr at 10/04/22 1204   guaiFENesin-dextromethorphan (ROBITUSSIN DM) 100-10 MG/5ML syrup 5 mL  5 mL Oral Q4H PRN Adefeso, Oladapo, DO   5 mL at 10/02/22 0919   ipratropium (ATROVENT) nebulizer solution 0.5 mg  0.5 mg Nebulization TID  Roxan Hockey, MD   0.5 mg at 10/04/22 1334   levalbuterol (XOPENEX) nebulizer solution 0.63 mg  0.63 mg Nebulization Q4H PRN Roxan Hockey, MD   0.63 mg at 10/01/22 2348   levalbuterol (XOPENEX) nebulizer solution 0.63 mg  0.63 mg Nebulization TID Roxan Hockey, MD   0.63 mg at 10/04/22 1334   levothyroxine (SYNTHROID) tablet 75 mcg  75 mcg Per Tube QAC breakfast Madueme, Elvira C, RPH   75 mcg at 10/04/22 0502   ondansetron (ZOFRAN) tablet 4 mg  4 mg Per Tube Q6H PRN Madueme, Elvira C, RPH       Or   ondansetron (ZOFRAN) injection 4 mg  4 mg Intravenous Q6H PRN Madueme, Elvira C, RPH       pantoprazole (PROTONIX) injection 40 mg  40 mg Intravenous Q12H Emokpae, Courage, MD   40 mg at 10/04/22 0806   polyethylene glycol (MIRALAX / GLYCOLAX) packet 17 g  17 g Per Tube Daily Madueme, Elvira C, RPH   17 g at 10/04/22 8677   prochlorperazine (COMPAZINE) suppository 25 mg  25 mg Rectal Q12H PRN Roxan Hockey, MD       traZODone (DESYREL) tablet 50 mg  50 mg Per Tube QHS Madueme, Elvira C, RPH   50 mg at 10/03/22 2115     Discharge Medications: Please see discharge summary for a list of discharge medications.  Relevant Imaging Results:  Relevant Lab Results:   Additional Information SSN [id number]  Ihor Gully, LCSW

## 2022-10-05 ENCOUNTER — Other Ambulatory Visit: Payer: Self-pay

## 2022-10-05 ENCOUNTER — Inpatient Hospital Stay (HOSPITAL_COMMUNITY): Payer: PPO | Admitting: Certified Registered Nurse Anesthetist

## 2022-10-05 ENCOUNTER — Encounter (HOSPITAL_COMMUNITY)
Admission: EM | Disposition: A | Discharge status: Skilled Nursing Facility | Payer: Self-pay | Source: Home / Self Care | Attending: Family Medicine

## 2022-10-05 ENCOUNTER — Encounter (HOSPITAL_COMMUNITY): Payer: Self-pay | Admitting: Internal Medicine

## 2022-10-05 DIAGNOSIS — K3189 Other diseases of stomach and duodenum: Secondary | ICD-10-CM | POA: Diagnosis not present

## 2022-10-05 DIAGNOSIS — K209 Esophagitis, unspecified without bleeding: Secondary | ICD-10-CM

## 2022-10-05 DIAGNOSIS — K920 Hematemesis: Secondary | ICD-10-CM | POA: Diagnosis not present

## 2022-10-05 DIAGNOSIS — I5032 Chronic diastolic (congestive) heart failure: Secondary | ICD-10-CM | POA: Diagnosis not present

## 2022-10-05 DIAGNOSIS — K317 Polyp of stomach and duodenum: Secondary | ICD-10-CM

## 2022-10-05 DIAGNOSIS — I4891 Unspecified atrial fibrillation: Secondary | ICD-10-CM | POA: Diagnosis not present

## 2022-10-05 DIAGNOSIS — I2510 Atherosclerotic heart disease of native coronary artery without angina pectoris: Secondary | ICD-10-CM | POA: Diagnosis not present

## 2022-10-05 DIAGNOSIS — D62 Acute posthemorrhagic anemia: Secondary | ICD-10-CM | POA: Diagnosis not present

## 2022-10-05 DIAGNOSIS — D132 Benign neoplasm of duodenum: Secondary | ICD-10-CM

## 2022-10-05 DIAGNOSIS — J189 Pneumonia, unspecified organism: Secondary | ICD-10-CM | POA: Diagnosis not present

## 2022-10-05 HISTORY — PX: HEMOSTASIS CLIP PLACEMENT: SHX6857

## 2022-10-05 HISTORY — PX: ESOPHAGEAL BRUSHING: SHX6842

## 2022-10-05 HISTORY — PX: BIOPSY: SHX5522

## 2022-10-05 HISTORY — PX: POLYPECTOMY: SHX5525

## 2022-10-05 HISTORY — PX: ESOPHAGOGASTRODUODENOSCOPY (EGD) WITH PROPOFOL: SHX5813

## 2022-10-05 LAB — GLUCOSE, CAPILLARY
Glucose-Capillary: 100 mg/dL — ABNORMAL HIGH (ref 70–99)
Glucose-Capillary: 100 mg/dL — ABNORMAL HIGH (ref 70–99)
Glucose-Capillary: 100 mg/dL — ABNORMAL HIGH (ref 70–99)
Glucose-Capillary: 79 mg/dL (ref 70–99)
Glucose-Capillary: 94 mg/dL (ref 70–99)

## 2022-10-05 LAB — CBC WITH DIFFERENTIAL/PLATELET
Abs Immature Granulocytes: 0 10*3/uL (ref 0.00–0.07)
Basophils Absolute: 0 10*3/uL (ref 0.0–0.1)
Basophils Relative: 0 %
Eosinophils Absolute: 0.1 10*3/uL (ref 0.0–0.5)
Eosinophils Relative: 1 %
HCT: 28.4 % — ABNORMAL LOW (ref 39.0–52.0)
Hemoglobin: 9.4 g/dL — ABNORMAL LOW (ref 13.0–17.0)
Lymphocytes Relative: 33 %
Lymphs Abs: 3 10*3/uL (ref 0.7–4.0)
MCH: 31.4 pg (ref 26.0–34.0)
MCHC: 33.1 g/dL (ref 30.0–36.0)
MCV: 95 fL (ref 80.0–100.0)
Monocytes Absolute: 1.4 10*3/uL — ABNORMAL HIGH (ref 0.1–1.0)
Monocytes Relative: 16 %
Neutro Abs: 4.5 10*3/uL (ref 1.7–7.7)
Neutrophils Relative %: 50 %
Platelets: 279 10*3/uL (ref 150–400)
RBC: 2.99 MIL/uL — ABNORMAL LOW (ref 4.22–5.81)
RDW: 15.4 % (ref 11.5–15.5)
WBC: 9 10*3/uL (ref 4.0–10.5)
nRBC: 0 % (ref 0.0–0.2)

## 2022-10-05 LAB — KOH PREP: KOH Prep: NONE SEEN

## 2022-10-05 LAB — PROTIME-INR
INR: 1.4 — ABNORMAL HIGH (ref 0.8–1.2)
Prothrombin Time: 16.8 seconds — ABNORMAL HIGH (ref 11.4–15.2)

## 2022-10-05 SURGERY — ESOPHAGOGASTRODUODENOSCOPY (EGD) WITH PROPOFOL
Anesthesia: General

## 2022-10-05 MED ORDER — PROPOFOL 10 MG/ML IV BOLUS
INTRAVENOUS | Status: DC | PRN
  Administered 2022-10-05: 60 mg via INTRAVENOUS

## 2022-10-05 MED ORDER — LACTATED RINGERS IV SOLN: INTRAVENOUS | Status: DC | PRN

## 2022-10-05 MED ORDER — LACTATED RINGERS IV SOLN: INTRAVENOUS | Status: DC

## 2022-10-05 MED ORDER — SODIUM CHLORIDE 0.9 % IV SOLN: INTRAVENOUS | Status: DC

## 2022-10-05 MED ORDER — LIDOCAINE HCL (PF) 2 % IJ SOLN
INTRAMUSCULAR | Status: AC
  Filled 2022-10-05: qty 5

## 2022-10-05 MED ORDER — LIDOCAINE HCL (PF) 2 % IJ SOLN
INTRAMUSCULAR | Status: DC | PRN
  Administered 2022-10-05: 50 mg via INTRADERMAL

## 2022-10-05 MED ORDER — SODIUM CHLORIDE FLUSH 0.9 % IV SOLN
INTRAVENOUS | Status: AC
  Filled 2022-10-05: qty 10

## 2022-10-05 MED ORDER — PHENYLEPHRINE 80 MCG/ML (10ML) SYRINGE FOR IV PUSH (FOR BLOOD PRESSURE SUPPORT)
PREFILLED_SYRINGE | INTRAVENOUS | Status: AC
  Filled 2022-10-05: qty 10

## 2022-10-05 MED ORDER — PROPOFOL 500 MG/50ML IV EMUL
INTRAVENOUS | Status: DC | PRN
  Administered 2022-10-05: 100 ug/kg/min via INTRAVENOUS

## 2022-10-05 MED ORDER — PHENYLEPHRINE HCL (PRESSORS) 10 MG/ML IV SOLN
INTRAVENOUS | Status: DC | PRN
  Administered 2022-10-05 (×2): 160 ug via INTRAVENOUS
  Administered 2022-10-05: 80 ug via INTRAVENOUS

## 2022-10-05 NOTE — Progress Notes (Signed)
PROGRESS NOTE     [name], is a 88 y.o. male, DOB - [birth year], OZH:[id number]  Admit date - 09/28/2022   Admitting Physician Bethena Roys, MD  Outpatient Primary MD for the patient is [name], [name], MD  LOS - 7  Chief Complaint  Patient presents with   Cough        Brief Narrative:   88 y.o. male with medical history significant for Atria Fibrillation, Crohn's, diastolic CHF, CAD admitted on 09/28/22 with multifocal pneumonia and A-fib with RVR    -Assessment and Plan: 1)Multifocal Pneumonia--- suspect aspiration component in the setting of recurrent emesis Admission CTA chest-multifocal pneumonia/aspiration -c/n treatment with IV ceftriaxone  -Patient has completed azithromycin  -Continue n.p.o. status -Endoscopic evaluation by GI later today. -Continue bronchodilators and mucolytics -Leukocytosis has resolved  2)Atrial fibrillation with RVR ([place]) --Continue to wean off IV Cardizem as able -Continue the use of Coreg. -Coumadin per pharmacy for stroke prophylaxis --INR trending down after vitamin K  3)Acute blood loss anemia secondary to acute GI bleed/intractable emesis --- -Baseline Hgb usually around 13-14 --No further coffee-ground emesis with NG tube in situ -continue IV PPI -INR trending down appropriately after vitamin K -Abdominal x-rays on 10/02/2022 without obstructive findings -Antiemetics as needed -Hgb stable over the last 24 hours after initially dropping. -Successful removal of NG tube on 10/04/2022 -Planning for endoscopic evaluation later today.  4)Acute Hypoxic Respiratory Failure--- secondary to mostly #1 and #2 above -Patient required 2 L nasal cannula supplementation; has been able to be weaned off to room air currently; he denies shortness of breath currently. -Continue monitoring.  5)Crohn's disease ([place]) Stable.  On Humira PTA -Will advise holding Humira given acute infection at this time  6)CAD (coronary artery  disease) Follows with Dr. Domenic Polite.  Mild/moderate nonobstructive disease 2004.  No chest pain.  EKG unchanged. -Continue Coreg, no aspirin as patient is already on Coumadin -Echo as below #8  7)Hypothyroidism-continue levothyroxine  8) Chronic diastolic heart failure (HCC) Stable and compensated.  Not on diuretics.   -Repeat echo on 10/01/2022 with EF of 50 to 55% which is similar to prior -Chest x-ray with small pleural effusion but no interstitial edema -General diuretics once oral intake is reliable  9) generalized weakness and deconditioning--- PT eval appreciated recommends SNF rehab  Status is: Inpatient   Disposition: The patient is from: Home              Anticipated d/c is to: SNF              Anticipated d/c date is: 3 days              Patient currently is not medically stable to d/c. Barriers: Not Clinically Stable-   Code Status :  -  Code Status: Full Code   Family Communication:    (patient is alert, awake and coherent)  -Discussed with his wife at bedside  DVT Prophylaxis  :   - SCDs /Coumadin     Lab Results  Component Value Date   PLT 279 10/05/2022   Inpatient Medications  Scheduled Meds:  carvedilol  12.5 mg Per Tube BID   Chlorhexidine Gluconate Cloth  6 each Topical Daily   ipratropium  0.5 mg Nebulization TID   levalbuterol  0.63 mg Nebulization TID   levothyroxine  75 mcg Per Tube QAC breakfast   pantoprazole (PROTONIX) IV  40 mg Intravenous Q12H   polyethylene glycol  17 g Per Tube Daily   traZODone  50 mg Per Tube QHS   Continuous Infusions:  sodium chloride     diltiazem (CARDIZEM) infusion 15 mg/hr (10/05/22 1511)   lactated ringers     PRN Meds:.acetaminophen **OR** acetaminophen, guaiFENesin-dextromethorphan, levalbuterol, ondansetron **OR** ondansetron (ZOFRAN) IV, prochlorperazine   Anti-infectives (From admission, onward)    Start     Dose/Rate Route Frequency Ordered Stop   10/03/22 1130  cefTRIAXone (ROCEPHIN) 1 g in sodium  chloride 0.9 % 100 mL IVPB        1 g 200 mL/hr over 30 Minutes Intravenous Every 24 hours 10/03/22 1015 10/05/22 1210   09/29/22 1500  cefTRIAXone (ROCEPHIN) 2 g in sodium chloride 0.9 % 100 mL IVPB        2 g 200 mL/hr over 30 Minutes Intravenous Every 24 hours 09/28/22 1900 10/02/22 1630   09/29/22 1500  azithromycin (ZITHROMAX) 500 mg in sodium chloride 0.9 % 250 mL IVPB  Status:  Discontinued        500 mg 250 mL/hr over 60 Minutes Intravenous Every 24 hours 09/28/22 1900 09/29/22 0849   09/29/22 1500  azithromycin (ZITHROMAX) 500 mg in sodium chloride 0.9 % 250 mL IVPB        500 mg 250 mL/hr over 60 Minutes Intravenous Every 24 hours 09/29/22 0849 10/02/22 1739   09/28/22 1515  cefTRIAXone (ROCEPHIN) 2 g in sodium chloride 0.9 % 100 mL IVPB        2 g 200 mL/hr over 30 Minutes Intravenous  Once 09/28/22 1500 09/28/22 1640   09/28/22 1515  azithromycin (ZITHROMAX) 500 mg in sodium chloride 0.9 % 250 mL IVPB        500 mg 250 mL/hr over 60 Minutes Intravenous  Once 09/28/22 1500 09/28/22 1837       Subjective: Caroll Rancher reporting no nausea or vomiting; denies chest pain and demonstrated good saturation on room air.  Chronically ill in appearance and deconditioned.  Slightly anxious while waiting for endoscopic evaluation.  NG tube has been successfully removed 1220 evening and he tolerated liquid diet. - Objective: Vitals:   10/05/22 1330 10/05/22 1500 10/05/22 1559 10/05/22 1600  BP: 91/79   (!) 165/90  Pulse: (!) 128 63  (!) 110  Resp: 20 17  17   Temp:   97.7 F (36.5 C)   TempSrc:   Oral   SpO2: 95% 95%  97%  Weight:      Height:        Intake/Output Summary (Last 24 hours) at 10/05/2022 1701 Last data filed at 10/05/2022 1408 Gross per 24 hour  Intake --  Output 952 ml  Net -952 ml   Filed Weights   10/04/22 0407 10/05/22 0500 10/05/22 1210  Weight: 71.5 kg 71.5 kg 71 kg   Physical Exam General exam: Alert, awake, oriented x 3; no chest pain, no nausea or  vomiting.  Slightly anxious in anticipation for endoscopic evaluation.  NG tube has been successfully removed since 10/04/2022 evening. Respiratory system: Good saturation on room air; no using accessory muscles.  Positive rhonchi appreciated bilaterally. Cardiovascular system: Irregular, no rubs, no gallops, no JVD on exam. Gastrointestinal system: Abdomen is nondistended, soft and nontender. No organomegaly or masses felt. Normal bowel sounds heard. Central nervous system: Alert and oriented. No focal neurological deficits. Extremities: No cyanosis or clubbing. Skin: No petechiae. Psychiatry: Judgement and insight appear normal.   Data Reviewed: I have personally reviewed following labs and imaging studies  CBC: Recent Labs  Lab 10/01/22 0422 10/02/22 1559 10/03/22  4403 10/04/22 0307 10/05/22 0531  WBC 11.3* 8.8 7.6 7.1 9.0  NEUTROABS  --  5.4  --  3.0 4.5  HGB 11.3* 9.4* 9.0* 9.1* 9.4*  HCT 32.8* 27.0* 26.1* 27.0* 28.4*  MCV 91.1 90.3 91.9 93.8 95.0  PLT 187 209 232 271 474   Basic Metabolic Panel: Recent Labs  Lab 09/29/22 0418 10/01/22 0422 10/03/22 0311 10/04/22 0307  NA 136 134* 139 140  K 3.5 3.7 3.0* 3.8  CL 102 104 109 111  CO2 25 21* 24 24  GLUCOSE 129* 133* 97 96  BUN 19 54* 50* 27*  CREATININE 1.31* 1.61* 1.48* 1.19  CALCIUM 8.3* 8.2* 7.8* 7.8*  MG 1.4* 2.5*  --   --   PHOS  --  2.5  --  1.8*   GFR: Estimated Creatinine Clearance: 40.7 mL/min (by C-G formula based on SCr of 1.19 mg/dL).  Liver Function Tests: Recent Labs  Lab 10/01/22 0422 10/03/22 0311 10/04/22 0307  AST  --  17  --   ALT  --  17  --   ALKPHOS  --  53  --   BILITOT  --  0.6  --   PROT  --  4.8*  --   ALBUMIN 2.6* 2.3* 2.2*   Recent Results (from the past 240 hour(s))  Resp panel by RT-PCR (RSV, Flu A&B, Covid) Anterior Nasal Swab     Status: None   Collection Time: 09/28/22  9:45 AM   Specimen: Anterior Nasal Swab  Result Value Ref Range Status   SARS Coronavirus 2 by RT  PCR NEGATIVE NEGATIVE Final    Comment: (NOTE) SARS-CoV-2 target nucleic acids are NOT DETECTED.  The SARS-CoV-2 RNA is generally detectable in upper respiratory specimens during the acute phase of infection. The lowest concentration of SARS-CoV-2 viral copies this assay can detect is 138 copies/mL. A negative result does not preclude SARS-Cov-2 infection and should not be used as the sole basis for treatment or other patient management decisions. A negative result may occur with  improper specimen collection/handling, submission of specimen other than nasopharyngeal swab, presence of viral mutation(s) within the areas targeted by this assay, and inadequate number of viral copies(<138 copies/mL). A negative result must be combined with clinical observations, patient history, and epidemiological information. The expected result is Negative.  Fact Sheet for Patients:  EntrepreneurPulse.com.au  Fact Sheet for Healthcare Providers:  IncredibleEmployment.be  This test is no t yet approved or cleared by the [place] FDA and  has been authorized for detection and/or diagnosis of SARS-CoV-2 by FDA under an Emergency Use Authorization (EUA). This EUA will remain  in effect (meaning this test can be used) for the duration of the COVID-19 declaration under Section 564(b)(1) of the Act, 21 U.S.C.section 360bbb-3(b)(1), unless the authorization is terminated  or revoked sooner.       Influenza A by PCR NEGATIVE NEGATIVE Final   Influenza B by PCR NEGATIVE NEGATIVE Final    Comment: (NOTE) The Xpert Xpress SARS-CoV-2/FLU/RSV plus assay is intended as an aid in the diagnosis of influenza from Nasopharyngeal swab specimens and should not be used as a sole basis for treatment. Nasal washings and aspirates are unacceptable for Xpert Xpress SARS-CoV-2/FLU/RSV testing.  Fact Sheet for Patients: EntrepreneurPulse.com.au  Fact Sheet for  Healthcare Providers: IncredibleEmployment.be  This test is not yet approved or cleared by the [place] FDA and has been authorized for detection and/or diagnosis of SARS-CoV-2 by FDA under an Emergency Use Authorization (EUA). This  EUA will remain in effect (meaning this test can be used) for the duration of the COVID-19 declaration under Section 564(b)(1) of the Act, 21 U.S.C. section 360bbb-3(b)(1), unless the authorization is terminated or revoked.     Resp Syncytial Virus by PCR NEGATIVE NEGATIVE Final    Comment: (NOTE) Fact Sheet for Patients: EntrepreneurPulse.com.au  Fact Sheet for Healthcare Providers: IncredibleEmployment.be  This test is not yet approved or cleared by the [place] FDA and has been authorized for detection and/or diagnosis of SARS-CoV-2 by FDA under an Emergency Use Authorization (EUA). This EUA will remain in effect (meaning this test can be used) for the duration of the COVID-19 declaration under Section 564(b)(1) of the Act, 21 U.S.C. section 360bbb-3(b)(1), unless the authorization is terminated or revoked.  Performed at [facility], [address] [place], [place] [address]   Culture, blood (Routine X 2) w Reflex to ID Panel     Status: None   Collection Time: 09/28/22  5:20 PM   Specimen: BLOOD  Result Value Ref Range Status   Specimen Description BLOOD BLOOD RIGHT HAND  Final   Special Requests   Final    BOTTLES DRAWN AEROBIC AND ANAEROBIC Blood Culture adequate volume   Culture   Final    NO GROWTH 5 DAYS Performed at [facility], [address], [place], [place] [address]    Report Status 10/03/2022 FINAL  Final  Culture, blood (Routine X 2) w Reflex to ID Panel     Status: None   Collection Time: 09/28/22  5:20 PM   Specimen: BLOOD  Result Value Ref Range Status   Specimen Description BLOOD BLOOD LEFT HAND  Final   Special Requests   Final    BOTTLES DRAWN AEROBIC AND  ANAEROBIC Blood Culture adequate volume   Culture   Final    NO GROWTH 5 DAYS Performed at [facility], [address], [place], [place] [address]    Report Status 10/03/2022 FINAL  Final  MRSA Next Gen by PCR, Nasal     Status: None   Collection Time: 09/28/22  7:01 PM   Specimen: Nasal Mucosa; Nasal Swab  Result Value Ref Range Status   MRSA by PCR Next Gen NOT DETECTED NOT DETECTED Final    Comment: (NOTE) The GeneXpert MRSA Assay (FDA approved for NASAL specimens only), is one component of a comprehensive MRSA colonization surveillance program. It is not intended to diagnose MRSA infection nor to guide or monitor treatment for MRSA infections. Test performance is not FDA approved in patients less than 80 years old. Performed at [facility], [address] [place], [place] [address]   [facility] prep     Status: None   Collection Time: 10/05/22 12:47 PM   Specimen: PATH GI Other  Result Value Ref Range Status   Specimen Description ESOPHAGUS  Final   Special Requests NONE  Final   KOH Prep   Final    NO YEAST OR FUNGAL ELEMENTS SEEN Performed at [facility], [address] [place], [place] [address]    Report Status 10/05/2022 FINAL  Final     Radiology Studies: DG ABD ACUTE 2+V W 1V CHEST  Result Date: 10/04/2022 CLINICAL DATA:  Emesis EXAM: DG ABDOMEN ACUTE WITH 1 VIEW CHEST COMPARISON:  10/02/2022 FINDINGS: There is no evidence of dilated bowel loops or free intraperitoneal air. No radiopaque calculi or other significant radiographic abnormality is seen. Bibasilar pulmonary parenchymal consolidation. Bilateral pleural effusion. Mild vascular congestion. Calcified aorta. NG tube  tip is superimposed with the stomach. IMPRESSION: Nonspecific bowel gas pattern. No free air. Bibasilar pulmonary parenchymal consolidation and pleural effusions. Electronically Signed   By: Sammie Bench M.D.   On: 10/04/2022 10:48    Scheduled Meds:  carvedilol  12.5 mg Per Tube BID    Chlorhexidine Gluconate Cloth  6 each Topical Daily   ipratropium  0.5 mg Nebulization TID   levalbuterol  0.63 mg Nebulization TID   levothyroxine  75 mcg Per Tube QAC breakfast   pantoprazole (PROTONIX) IV  40 mg Intravenous Q12H   polyethylene glycol  17 g Per Tube Daily   traZODone  50 mg Per Tube QHS   Continuous Infusions:  sodium chloride     diltiazem (CARDIZEM) infusion 15 mg/hr (10/05/22 1511)   lactated ringers      LOS: 7 days   Barton Dubois M.D on 10/05/2022 at 5:01 PM  Go to [email or web address] - for contact info  [facility] - Office  [phone]  If 7PM-7AM, please contact night-coverage [email or web address] 10/05/2022, 5:01 PM

## 2022-10-05 NOTE — TOC Progression Note (Signed)
Transition of Care [facility]) - Progression Note    Patient Details  Name: NATHANAEL KRIST MRN: [id number] Date of Birth: 1930-10-22  [facility] CM/SW Contact  Ihor Gully, LCSW Phone Number: 10/05/2022, 1:20 PM  Clinical Narrative:   Mrs. [place] is agreeable to rehab referral being sent to [facility] SNF. Referral sent.   Planned Disposition: Home Barriers to Discharge: Continued Medical Work up  Expected Discharge Plan and Services       Living arrangements for the past 2 months: [place] Determinants of Health (SDOH) Interventions SDOH Screenings   Food Insecurity: No Food Insecurity (10/02/2022)  Housing: Low Risk  (10/02/2022)  Transportation Needs: No Transportation Needs (10/02/2022)  Utilities: Not At Risk (10/02/2022)  Tobacco Use: Low Risk  (10/05/2022)    Readmission Risk Interventions     No data to display

## 2022-10-05 NOTE — Brief Op Note (Signed)
09/28/2022 - 10/05/2022  1:14 PM  PATIENT:  [name]  88 y.o. male  PRE-OPERATIVE DIAGNOSIS:  hematemesis, melena  POST-OPERATIVE DIAGNOSIS:  esophagitis, duodenitis, duodenal polyp, duodenal noule, small hiatal hernia42-34cm  PROCEDURE:  Procedure(s): ESOPHAGOGASTRODUODENOSCOPY (EGD) WITH PROPOFOL (N/A) ESOPHAGEAL BRUSHING BIOPSY POLYPECTOMY HEMOSTASIS CLIP PLACEMENT  SURGEON:  Surgeon(s) and Role:    * Harvel Quale, MD - Primary  Patient underwent EGD under propofol sedation.  Tolerated the procedure adequately.   Findings: - LA Grade D esophagitis with no bleeding.  Brushings performed.  - Erythematous mucosa in the gastric body.  - Nodular mucosa in the second portion of the duodenum.  Biopsied.  - A single duodenal polyp.  Resected and retrieved.  Clip was placed.  RECOMMENDATIONS - Return patient to hospital ward for ongoing care.  - Full liquid diet today, followed by GI soft diet tomorrow. - Pantoprazole 40 mg BID PO. - Sucralfate 1 g every 6 hours for one month. - Repeat EGD in 3 months. - H/H every day. - If considering to restart coumadin, hold on restarting it for the next 72 hours.  [name], MD Gastroenterology and Hepatology [facility]

## 2022-10-05 NOTE — Anesthesia Preprocedure Evaluation (Signed)
Anesthesia Evaluation  Patient identified by MRN, date of birth, ID band Patient awake    Reviewed: Allergy & Precautions, H&P , NPO status , Patient's Chart, lab work & pertinent test results, reviewed documented beta blocker date and time   Airway Mallampati: II  TM Distance: >3 FB Neck ROM: full    Dental no notable dental hx.    Pulmonary shortness of breath, pneumonia   Pulmonary exam normal breath sounds clear to auscultation       Cardiovascular Exercise Tolerance: Good + CAD  + dysrhythmias Atrial Fibrillation  Rhythm:regular Rate:Normal     Neuro/Psych negative neurological ROS  negative psych ROS   GI/Hepatic Neg liver ROS,GERD  Medicated,,  Endo/Other  Hypothyroidism    Renal/GU negative Renal ROS  negative genitourinary   Musculoskeletal   Abdominal   Peds  Hematology  (+) Blood dyscrasia, anemia   Anesthesia Other Findings   Reproductive/Obstetrics negative OB ROS                             Anesthesia Physical Anesthesia Plan  ASA: 4 and emergent  Anesthesia Plan: General   Post-op Pain Management:    Induction:   PONV Risk Score and Plan: Propofol infusion  Airway Management Planned:   Additional Equipment:   Intra-op Plan:   Post-operative Plan:   Informed Consent: I have reviewed the patients History and Physical, chart, labs and discussed the procedure including the risks, benefits and alternatives for the proposed anesthesia with the patient or authorized representative who has indicated his/her understanding and acceptance.     Dental Advisory Given  Plan Discussed with: CRNA  Anesthesia Plan Comments:        Anesthesia Quick Evaluation

## 2022-10-05 NOTE — Progress Notes (Signed)
Patient briefly seen. Wife at bedside.  Denies abdominal pain, nausea. Breathing improved. VSS. Heart rate 90-100, remains on IV Cardiazem. Oxygen sat 96% on room air. NG tube removed. Chest clear bilaterally. Abd exam benign. Per nursing, continues to have black stool, last one yesterday.   Appears stable for EGD for history of hematemesis. OK to proceed per anesthesia. Check CBC.  I have discussed the risks, alternatives, benefits with regards to but not limited to the risk of reaction to medication, bleeding, infection, perforation and the patient is agreeable to proceed. Written consent to be obtained.  Laureen Ochs. Bernarda Caffey [facility] [phone] 12/21/20239:19 AM

## 2022-10-05 NOTE — Op Note (Signed)
[facility] Patient Name: [name] Procedure Date: 10/05/2022 12:08 PM MRN: [id number] Date of Birth: 1931/08/18 Attending MD: [name] , , [id number] CSN: [id number] Age: 88 Admit Type: Inpatient Procedure:                Upper GI endoscopy Indications:              Coffee-ground emesis Providers:                [name], Rosina Lowenstein, RN, Raphael Gibney,                            Technician Referring MD:              Medicines:                Monitored Anesthesia Care Complications:            No immediate complications. Estimated Blood Loss:     Estimated blood loss: none. Procedure:                Pre-Anesthesia Assessment:                           - Prior to the procedure, a History and Physical                            was performed, and patient medications, allergies                            and sensitivities were reviewed. The patient's                            tolerance of previous anesthesia was reviewed.                           - The risks and benefits of the procedure and the                            sedation options and risks were discussed with the                            patient. All questions were answered and informed                            consent was obtained.                           - ASA Grade Assessment: III - A patient with severe                            systemic disease.                           After obtaining informed consent, the endoscope was                            passed under direct vision. Throughout the  procedure, the patient's blood pressure, pulse, and                            oxygen saturations were monitored continuously. The                            [id number]-H190 ([id number]) scope was introduced through the                            mouth, and advanced to the second part of duodenum.                            The upper GI endoscopy was accomplished without                             difficulty. The patient tolerated the procedure                            well. Scope In: 12:44:55 PM Scope Out: 1:02:33 PM Total Procedure Duration: 0 hours 17 minutes 38 seconds  Findings:      LA Grade D (one or more mucosal breaks involving at least 75% of       esophageal circumference) esophagitis with no bleeding was found 34 to       42 cm from the incisors. Significant amount of debris was overlying the       esophagus. Brushings for KOH prep were obtained.      Round areas of erythematous mucosa was found in the gastric body. Likely       related to NG tube.      Nodular mucosa was found in the second portion of the duodenum, measured       close to 1 cm in size. Imaging was performed using white light and       narrow band imaging to visualize the mucosa. This was biopsied with a       cold forceps for histology.      A single 4 mm sessile polyp with no bleeding was found in the second       portion of the duodenum. The polyp was removed with a cold snare.       Resection and retrieval were complete. To prevent bleeding persistent       after the polypectomy, one hemostatic clip was successfully placed. Clip       manufacturer: [employer]. There was no bleeding at the end of the       procedure. Impression:               - LA Grade D esophagitis with no bleeding.                            Brushings performed.                           - Erythematous mucosa in the gastric body.                           - Nodular mucosa in the second portion of the  duodenum. Biopsied.                           - A single duodenal polyp. Resected and retrieved.                            Clip was placed. Clip manufacturer: [employer]. Moderate Sedation:      Per Anesthesia Care Recommendation:           - Return patient to hospital ward for ongoing care.                           - Full liquid diet today,  followed by GI soft diet                            tomorrow.                           - Pantoprazole 40 mg BID PO.                           - Sucralfate 1 g every 6 hours for one month.                           - Repeat EGD in 3 months.                           - H/H every day.                           - If considering to restart coumadin, hold on                            restarting it for the next 72 hours. Procedure Code(s):        --- Professional ---                           [id number], Esophagogastroduodenoscopy, flexible,                            transoral; with removal of tumor(s), polyp(s), or                            other lesion(s) by snare technique                           43239, 55, Esophagogastroduodenoscopy, flexible,                            transoral; with biopsy, single or multiple Diagnosis Code(s):        --- Professional ---                           K20.90, Esophagitis,  unspecified without bleeding                           K31.89, Other diseases of stomach and duodenum                           K31.7, Polyp of stomach and duodenum                           K92.0, Hematemesis CPT copyright 2022 [employer]. All rights reserved. The codes documented in this report are preliminary and upon coder review may  be revised to meet current compliance requirements. [name], MD [name],  10/05/2022 1:17:22 PM This report has been signed electronically. Number of Addenda: 0

## 2022-10-05 NOTE — Progress Notes (Signed)
SLP Cancellation Note  Patient Details Name: [name] MRN: [id number] DOB: 05/01/1931   Cancelled treatment:       Reason Eval/Treat Not Completed: Patient at procedure or test/unavailable (Pt had EGD; See GI notes for diet advancement tomorrow)  Thank you,  Genene Churn, [place] 10/05/2022, 4:03 PM

## 2022-10-05 NOTE — Transfer of Care (Signed)
Immediate Anesthesia Transfer of Care Note  Patient: [name]  Procedure(s) Performed: ESOPHAGOGASTRODUODENOSCOPY (EGD) WITH PROPOFOL ESOPHAGEAL BRUSHING BIOPSY POLYPECTOMY HEMOSTASIS CLIP PLACEMENT  Patient Location: PACU  Anesthesia Type:General  Level of Consciousness: drowsy, patient cooperative, and responds to stimulation  Airway & Oxygen Therapy: Patient Spontanous Breathing  Post-op Assessment: Report given to RN, Post -op Vital signs reviewed and stable, Patient moving all extremities X 4, and Patient able to stick tongue midline  Post vital signs: Reviewed  Last Vitals:  Vitals Value Taken Time  BP 109/66   Temp 98.5   Pulse 116   Resp 22   SpO2 97     Last Pain:  Vitals:   10/05/22 1237  TempSrc:   PainSc: 0-No pain      Patients Stated Pain Goal: 1 (10/40/45 9136)  Complications: No notable events documented.

## 2022-10-06 ENCOUNTER — Telehealth: Payer: Self-pay | Admitting: Gastroenterology

## 2022-10-06 DIAGNOSIS — I4891 Unspecified atrial fibrillation: Secondary | ICD-10-CM | POA: Diagnosis not present

## 2022-10-06 DIAGNOSIS — D62 Acute posthemorrhagic anemia: Secondary | ICD-10-CM | POA: Diagnosis not present

## 2022-10-06 DIAGNOSIS — I5032 Chronic diastolic (congestive) heart failure: Secondary | ICD-10-CM | POA: Diagnosis not present

## 2022-10-06 DIAGNOSIS — J189 Pneumonia, unspecified organism: Secondary | ICD-10-CM | POA: Diagnosis not present

## 2022-10-06 LAB — BASIC METABOLIC PANEL
Anion gap: 6 (ref 5–15)
BUN: 10 mg/dL (ref 8–23)
CO2: 24 mmol/L (ref 22–32)
Calcium: 7.9 mg/dL — ABNORMAL LOW (ref 8.9–10.3)
Chloride: 105 mmol/L (ref 98–111)
Creatinine, Ser: 1.1 mg/dL (ref 0.61–1.24)
GFR, Estimated: >60 mL/min (ref >60)
Glucose, Bld: 95 mg/dL (ref 70–99)
Potassium: 3.2 mmol/L — ABNORMAL LOW (ref 3.5–5.1)
Sodium: 135 mmol/L (ref 135–145)

## 2022-10-06 LAB — GLUCOSE, CAPILLARY
Glucose-Capillary: 107 mg/dL — ABNORMAL HIGH (ref 70–99)
Glucose-Capillary: 96 mg/dL (ref 70–99)
Glucose-Capillary: 98 mg/dL (ref 70–99)
Glucose-Capillary: 81 mg/dL (ref 70–99)
Glucose-Capillary: 87 mg/dL (ref 70–99)
Glucose-Capillary: 106 mg/dL — ABNORMAL HIGH (ref 70–99)

## 2022-10-06 LAB — PROTIME-INR
INR: 1.4 — ABNORMAL HIGH (ref 0.8–1.2)
Prothrombin Time: 17 seconds — ABNORMAL HIGH (ref 11.4–15.2)

## 2022-10-06 LAB — HEMOGLOBIN AND HEMATOCRIT, BLOOD
HCT: 30.7 % — ABNORMAL LOW (ref 39.0–52.0)
Hemoglobin: 10.6 g/dL — ABNORMAL LOW (ref 13.0–17.0)

## 2022-10-06 LAB — MAGNESIUM: Magnesium: 1.5 mg/dL — ABNORMAL LOW (ref 1.7–2.4)

## 2022-10-06 MED ORDER — CARVEDILOL 12.5 MG PO TABS
12.5000 mg | ORAL_TABLET | Freq: Two times a day (BID) | ORAL | Status: DC
  Administered 2022-10-06 – 2022-10-10 (×8): 12.5 mg via ORAL
  Filled 2022-10-06 (×8): qty 1

## 2022-10-06 MED ORDER — HALOPERIDOL LACTATE 5 MG/ML IJ SOLN
0.5000 mg | Freq: Four times a day (QID) | INTRAMUSCULAR | Status: DC | PRN
  Administered 2022-10-06 – 2022-10-07 (×2): 0.5 mg via INTRAVENOUS
  Filled 2022-10-06 (×2): qty 1

## 2022-10-06 MED ORDER — DILTIAZEM HCL ER COATED BEADS 180 MG PO CP24
180.0000 mg | ORAL_CAPSULE | Freq: Two times a day (BID) | ORAL | Status: DC
  Administered 2022-10-06 – 2022-10-10 (×9): 180 mg via ORAL
  Filled 2022-10-06 (×9): qty 1

## 2022-10-06 MED ORDER — LORAZEPAM 2 MG/ML IJ SOLN
0.5000 mg | Freq: Once | INTRAMUSCULAR | Status: AC
  Administered 2022-10-06: 0.5 mg via INTRAVENOUS
  Filled 2022-10-06: qty 1

## 2022-10-06 MED ORDER — POTASSIUM CHLORIDE CRYS ER 20 MEQ PO TBCR
40.0000 meq | EXTENDED_RELEASE_TABLET | Freq: Once | ORAL | Status: AC
  Administered 2022-10-06: 40 meq via ORAL
  Filled 2022-10-06: qty 2

## 2022-10-06 MED ORDER — MAGNESIUM SULFATE 2 GM/50ML IV SOLN
2.0000 g | Freq: Once | INTRAVENOUS | Status: AC
  Administered 2022-10-06: 2 g via INTRAVENOUS
  Filled 2022-10-06: qty 50

## 2022-10-06 NOTE — Progress Notes (Signed)
SLP Cancellation Note  Patient Details Name: [name] MRN: [id number] DOB: [birth year]   Cancelled treatment:        Pt is confused this morning; EGD completed yesterday with recommendation for liquid diet yesterday and advancement to soft diet today. ST will continue to follow and defer to GI for diet recommendations and advancement, thank you.  Kainat Pizana H. Roddie Mc, CCC-SLP Speech Language Pathologist    Wende Bushy 10/06/2022, 8:22 AM

## 2022-10-06 NOTE — Progress Notes (Signed)
Physical Therapy Treatment Patient Details Name: [name] MRN: [id number] DOB: 10-18-1930 Today's Date: 10/06/2022   History of Present Illness [name] is a 88 y.o. male with medical history significant for Atria Fibrillation, Crohn's, diastolic CHF, CAD.  Patient presented to the ED with complaints of worsening cough over the past 2 weeks, this morning he had at least 5 episodes of vomiting was lightheaded and came to the ED.  Reports earlier into his symptoms, he had a chest x-ray done by his outpatient provider and it did not show a chest x-ray, he was prescribed Mucinex.  He denies difficulty breathing.  No chest pain.  No leg swelling.  He denies palpitations.    PT Comments    Patient agreeable for therapy, demonstrates fair/good return for sitting up at bedside, no loss of sitting balance while completing BLE exercises supporting self with BUE, very unsteady on feet with LLE frequently sliding medial requiring Mod tactile assist to move left foot underneath shoulder and limited for taking steps due to left lateral leaning, fatigue and increasing HR - nurse aware.  Patient tolerated sitting up in chair after therapy with mittens off and his spouse present in room - RN notified.  Patient will benefit from continued skilled physical therapy in hospital and recommended venue below to increase strength, balance, endurance for safe ADLs and gait.    Recommendations for follow up therapy are one component of a multi-disciplinary discharge planning process, led by the attending physician.  Recommendations may be updated based on patient status, additional functional criteria and insurance authorization.  Follow Up Recommendations  Skilled nursing-short term rehab (<3 hours/day) Can patient physically be transported by private vehicle: Yes   Assistance Recommended at Discharge Intermittent Supervision/Assistance  Patient can return home with the following A lot of help with  bathing/dressing/bathroom;A lot of help with walking and/or transfers;Help with stairs or ramp for entrance;Assistance with cooking/housework   Equipment Recommendations  Rolling walker (2 wheels)    Recommendations for Other Services       Precautions / Restrictions Precautions Precautions: Fall Restrictions Weight Bearing Restrictions: No     Mobility  Bed Mobility Overal bed mobility: Needs Assistance Bed Mobility: Supine to Sit     Supine to sit: Min assist     General bed mobility comments: slightly labored movement    Transfers Overall transfer level: Needs assistance Equipment used: Rolling walker (2 wheels) Transfers: Sit to/from Stand, Bed to chair/wheelchair/BSC Sit to Stand: Min assist, Mod assist   Step pivot transfers: Min assist, Mod assist       General transfer comment: labored movement, scissoring of legs    Ambulation/Gait Ambulation/Gait assistance: Mod assist Gait Distance (Feet): 8 Feet Assistive device: Rolling walker (2 wheels) Gait Pattern/deviations: Decreased step length - right, Decreased step length - left, Decreased stride length, Scissoring, Narrow base of support Gait velocity: decreased     General Gait Details: slow labored movement with tendency to slide LLE toward midline with frequent leaning to the left requiring Mod tactile assist to keep left foot underneath shoulder   Stairs             Wheelchair Mobility    Modified Rankin (Stroke Patients Only)       Balance Overall balance assessment: Needs assistance Sitting-balance support: Feet supported, No upper extremity supported Sitting balance-Leahy Scale: Fair Sitting balance - Comments: fair/good seated at EOB   Standing balance support: During functional activity, Reliant on assistive device for balance, Bilateral upper  extremity supported Standing balance-Leahy Scale: Poor Standing balance comment: fair/poor using RW                             Cognition Arousal/Alertness: Awake/alert Behavior During Therapy: WFL for tasks assessed/performed Overall Cognitive Status: Within Functional Limits for tasks assessed                                          Exercises General Exercises - Lower Extremity Long Arc Quad: Seated, AROM, Strengthening, Both, 10 reps Hip Flexion/Marching: Seated, AROM, Strengthening, Both, 10 reps Toe Raises: Seated, AROM, Strengthening, Both, 15 reps Heel Raises: Seated, AROM, Strengthening, Both, 15 reps    General Comments        Pertinent Vitals/Pain Pain Assessment Pain Assessment: No/denies pain    Home Living                          Prior Function            PT Goals (current goals can now be found in the care plan section) Acute Rehab PT Goals Patient Stated Goal: return home after rehab PT Goal Formulation: With patient/family Time For Goal Achievement: 10/17/22 Potential to Achieve Goals: Good Progress towards PT goals: Progressing toward goals    Frequency    Min 3X/week      PT Plan Current plan remains appropriate    Co-evaluation              AM-PAC PT "6 Clicks" Mobility   Outcome Measure  Help needed turning from your back to your side while in a flat bed without using bedrails?: A Little Help needed moving from lying on your back to sitting on the side of a flat bed without using bedrails?: A Little Help needed moving to and from a bed to a chair (including a wheelchair)?: A Lot Help needed standing up from a chair using your arms (e.g., wheelchair or bedside chair)?: A Lot Help needed to walk in hospital room?: A Lot Help needed climbing 3-5 steps with a railing? : Total 6 Click Score: 13    End of Session   Activity Tolerance: Patient tolerated treatment well;Patient limited by fatigue Patient left: in chair;with call bell/phone within reach;with family/visitor present Nurse Communication: Mobility status PT Visit  Diagnosis: Unsteadiness on feet (R26.81);Other abnormalities of gait and mobility (R26.89);Muscle weakness (generalized) (M62.81)     Time: 5797-2820 PT Time Calculation (min) (ACUTE ONLY): 26 min  Charges:  $Therapeutic Exercise: 8-22 mins $Therapeutic Activity: 8-22 mins                     3:57 PM, 10/06/22 Lonell Grandchild, MPT Physical Therapist with [facility] 336 [phone] office [phone] mobile phone

## 2022-10-06 NOTE — TOC Progression Note (Signed)
Transition of Care [facility]) - Progression Note    Patient Details  Name: [name] MRN: [id number] Date of Birth: 1930/12/27  [facility] CM/SW [place], [place] Phone Number: 10/06/2022, 12:43 PM  Clinical Narrative:    SNF referral sent to [facility] in [place] is still pending at this time. CSW reached out to [facility] in admissions requesting review of referral. TOC to follow for updates.   Expected Discharge Plan: [place] Barriers to Discharge: Continued Medical Work up  Expected Discharge Plan and Services       Living arrangements for the past 2 months: Single Family Home                                       Social Determinants of Health (SDOH) Interventions SDOH Screenings   Food Insecurity: No Food Insecurity (10/02/2022)  Housing: Low Risk  (10/02/2022)  Transportation Needs: No Transportation Needs (10/02/2022)  Utilities: Not At Risk (10/02/2022)  Tobacco Use: Low Risk  (10/05/2022)    Readmission Risk Interventions     No data to display

## 2022-10-06 NOTE — Progress Notes (Signed)
Pt given 0.35m IV Ativan to help with sleep at 0330, pt did have some relief with that but not much.

## 2022-10-06 NOTE — Progress Notes (Signed)
PROGRESS NOTE     [name], is a 88 y.o. male, DOB - 1930/12/28, ZOX:[id number]  Admit date - 09/28/2022   Admitting Physician Bethena Roys, MD  Outpatient Primary MD for the patient is [name], [name], MD  LOS - 8  Chief Complaint  Patient presents with   Cough        Brief Narrative:   88 y.o. male with medical history significant for Atria Fibrillation, Crohn's, diastolic CHF, CAD admitted on 09/28/22 with multifocal pneumonia and A-fib with RVR    -Assessment and Plan: 1)Multifocal Pneumonia--- suspect aspiration component in the setting of recurrent emesis Admission CTA chest-multifocal pneumonia/aspiration -Continue antibiotics; plan is to complete total of 10 days. -Patient has completed azithromycin  -Massagee diet as per gastroenterology recommendations. -Endoscopic evaluation by GI later today. -Continue bronchodilators and mucolytics -Leukocytosis has resolved  2)Atrial fibrillation with RVR ([place]) -Currently stable and well-controlled -Planning to transition of Cardizem drip into oral route and continue the use of Coreg. -Coumadin per pharmacy for stroke prophylaxis --INR therapeutic currently.  3)Acute blood loss anemia secondary to acute GI bleed/intractable emesis --- -Baseline Hgb usually around 13-14 --No further coffee-ground emesis with NG tube in situ -INR trending down appropriately after vitamin K -Abdominal x-rays on 10/02/2022 without obstructive findings -Continue antiemetics as needed -Hgb stable over the last 24 hours after initially dropping. -Successful removal of NG tube on 10/04/2022 -Status post endoscopy evaluation on 10/05/2022 demonstrating: grade D esophagitis without acute bleeding.  There was erythematous changes in the gastric body and nodular mucosa in the second portion of the duodenum status post biopsies. -Follow recommendations by gastroenterology service continue PPI and advance diet.  4)Acute Hypoxic Respiratory  Failure--- secondary to mostly #1 and #2 above -Patient required 2 L nasal cannula supplementation; has been able to be weaned off to room air currently; he denies shortness of breath currently. -Continue monitoring.  5)Crohn's disease ([place]) Stable.  On Humira PTA; planning to resume at discharge. -Will advise holding Humira given acute infection at this time  6)CAD (coronary artery disease) Follows with Dr. Domenic Polite.  Mild/moderate nonobstructive disease 2004.  No chest pain.  EKG unchanged. -Continue Coreg, no aspirin as patient is already on Coumadin -Echo as below #8  7)Hypothyroidism- -continue levothyroxine  8) Chronic diastolic heart failure (HCC) Stable and compensated.  Not on diuretics.   -Repeat echo on 10/01/2022 with EF of 50 to 55% which is similar to prior -Chest x-ray with small pleural effusion but no interstitial edema -Resewn diuretics when fully able to tolerate by mouth.  9) generalized weakness and deconditioning--- PT eval appreciated recommends SNF rehab  10) sundowning: -Avoid the use of benzodiazepines -As needed Haldol has been ordered -Continue constant reorientation and supportive care.  Status is: Inpatient   Disposition: The patient is from: Home              Anticipated d/c is to: SNF              Anticipated d/c date is: 3 days              Patient currently is not medically stable to d/c. Barriers: Not Clinically Stable-   Code Status :  -  Code Status: Full Code   Family Communication:    (patient is alert, awake and coherent)  -Discussed with his wife at bedside  DVT Prophylaxis  :   - SCDs /Coumadin     Lab Results  Component Value Date   PLT 279  10/05/2022   Inpatient Medications  Scheduled Meds:  carvedilol  12.5 mg Oral BID   Chlorhexidine Gluconate Cloth  6 each Topical Daily   diltiazem  180 mg Oral Q12H   ipratropium  0.5 mg Nebulization TID   levalbuterol  0.63 mg Nebulization TID   levothyroxine  75 mcg Per Tube QAC  breakfast   pantoprazole (PROTONIX) IV  40 mg Intravenous Q12H   polyethylene glycol  17 g Per Tube Daily   Continuous Infusions:  sodium chloride     lactated ringers     magnesium sulfate bolus IVPB     PRN Meds:.acetaminophen **OR** acetaminophen, guaiFENesin-dextromethorphan, haloperidol lactate, levalbuterol, ondansetron **OR** ondansetron (ZOFRAN) IV, prochlorperazine   Anti-infectives (From admission, onward)    Start     Dose/Rate Route Frequency Ordered Stop   10/03/22 1130  cefTRIAXone (ROCEPHIN) 1 g in sodium chloride 0.9 % 100 mL IVPB        1 g 200 mL/hr over 30 Minutes Intravenous Every 24 hours 10/03/22 1015 10/05/22 1210   09/29/22 1500  cefTRIAXone (ROCEPHIN) 2 g in sodium chloride 0.9 % 100 mL IVPB        2 g 200 mL/hr over 30 Minutes Intravenous Every 24 hours 09/28/22 1900 10/02/22 1630   09/29/22 1500  azithromycin (ZITHROMAX) 500 mg in sodium chloride 0.9 % 250 mL IVPB  Status:  Discontinued        500 mg 250 mL/hr over 60 Minutes Intravenous Every 24 hours 09/28/22 1900 09/29/22 0849   09/29/22 1500  azithromycin (ZITHROMAX) 500 mg in sodium chloride 0.9 % 250 mL IVPB        500 mg 250 mL/hr over 60 Minutes Intravenous Every 24 hours 09/29/22 0849 10/02/22 1739   09/28/22 1515  cefTRIAXone (ROCEPHIN) 2 g in sodium chloride 0.9 % 100 mL IVPB        2 g 200 mL/hr over 30 Minutes Intravenous  Once 09/28/22 1500 09/28/22 1640   09/28/22 1515  azithromycin (ZITHROMAX) 500 mg in sodium chloride 0.9 % 250 mL IVPB        500 mg 250 mL/hr over 60 Minutes Intravenous  Once 09/28/22 1500 09/28/22 1837       Subjective: [name] very somnolent/lethargic; no chest pain, no nausea or vomiting reported.  Was able to tolerate clear liquid diet and some soft food today.  Overnight experiencing agitation, hallucinations and restlessness. - Objective: Vitals:   10/06/22 1230 10/06/22 1300 10/06/22 1400 10/06/22 1425  BP:  (!) 141/72 (!) 144/70   Pulse:  98 76    Resp:  17 16   Temp: 99 F (37.2 C)     TempSrc: Axillary     SpO2:  97% 98% 97%  Weight:      Height:        Intake/Output Summary (Last 24 hours) at 10/06/2022 1555 Last data filed at 10/06/2022 1400 Gross per 24 hour  Intake 557.09 ml  Output 1850 ml  Net -1292.91 ml   Filed Weights   10/04/22 0407 10/05/22 0500 10/05/22 1210  Weight: 71.5 kg 71.5 kg 71 kg   Physical Exam General exam: Somnolent, oriented x 1 and with reports of agitation, restlessness and hallucinations overnight.  Patient received Ativan and according to wife having very difficult to keep awake. Respiratory system: Good saturation on room air; no wheezing, no crackles.  Positive rhonchi appreciated on exam. Cardiovascular system: Rate controlled, no rubs, no gallops, no JVD on exam. Gastrointestinal system: Abdomen is nondistended,  soft and nontender. No organomegaly or masses felt. Normal bowel sounds heard. Central nervous system: Alert and oriented. No focal neurological deficits. Extremities: No cyanosis or clubbing. Skin: No r petechiae. Psychiatry: Judgement and insight appear impaired currently secondary to ongoing somnolence/lethargy.  Patient most likely sundowning.  Data Reviewed: I have personally reviewed following labs and imaging studies  CBC: Recent Labs  Lab 10/01/22 0422 10/02/22 1559 10/03/22 0311 10/04/22 0307 10/05/22 0531 10/06/22 1004  WBC 11.3* 8.8 7.6 7.1 9.0  --   NEUTROABS  --  5.4  --  3.0 4.5  --   HGB 11.3* 9.4* 9.0* 9.1* 9.4* 10.6*  HCT 32.8* 27.0* 26.1* 27.0* 28.4* 30.7*  MCV 91.1 90.3 91.9 93.8 95.0  --   PLT 187 209 232 271 279  --    Basic Metabolic Panel: Recent Labs  Lab 10/01/22 0422 10/03/22 0311 10/04/22 0307 10/06/22 0306  NA 134* 139 140 135  K 3.7 3.0* 3.8 3.2*  CL 104 109 111 105  CO2 21* 24 24 24   GLUCOSE 133* 97 96 95  BUN 54* 50* 27* 10  CREATININE 1.61* 1.48* 1.19 1.10  CALCIUM 8.2* 7.8* 7.8* 7.9*  MG 2.5*  --   --  1.5*  PHOS 2.5   --  1.8*  --    GFR: Estimated Creatinine Clearance: 44 mL/min (by C-G formula based on SCr of 1.1 mg/dL).  Liver Function Tests: Recent Labs  Lab 10/01/22 0422 10/03/22 0311 10/04/22 0307  AST  --  17  --   ALT  --  17  --   ALKPHOS  --  53  --   BILITOT  --  0.6  --   PROT  --  4.8*  --   ALBUMIN 2.6* 2.3* 2.2*   Recent Results (from the past 240 hour(s))  Resp panel by RT-PCR (RSV, Flu A&B, Covid) Anterior Nasal Swab     Status: None   Collection Time: 09/28/22  9:45 AM   Specimen: Anterior Nasal Swab  Result Value Ref Range Status   SARS Coronavirus 2 by RT PCR NEGATIVE NEGATIVE Final    Comment: (NOTE) SARS-CoV-2 target nucleic acids are NOT DETECTED.  The SARS-CoV-2 RNA is generally detectable in upper respiratory specimens during the acute phase of infection. The lowest concentration of SARS-CoV-2 viral copies this assay can detect is 138 copies/mL. A negative result does not preclude SARS-Cov-2 infection and should not be used as the sole basis for treatment or other patient management decisions. A negative result may occur with  improper specimen collection/handling, submission of specimen other than nasopharyngeal swab, presence of viral mutation(s) within the areas targeted by this assay, and inadequate number of viral copies(<138 copies/mL). A negative result must be combined with clinical observations, patient history, and epidemiological information. The expected result is Negative.  Fact Sheet for Patients:  EntrepreneurPulse.com.au  Fact Sheet for Healthcare Providers:  IncredibleEmployment.be  This test is no t yet approved or cleared by the [place] FDA and  has been authorized for detection and/or diagnosis of SARS-CoV-2 by FDA under an Emergency Use Authorization (EUA). This EUA will remain  in effect (meaning this test can be used) for the duration of the COVID-19 declaration under Section 564(b)(1) of the  Act, 21 U.S.C.section 360bbb-3(b)(1), unless the authorization is terminated  or revoked sooner.       Influenza A by PCR NEGATIVE NEGATIVE Final   Influenza B by PCR NEGATIVE NEGATIVE Final    Comment: (NOTE) The Xpert  Xpress SARS-CoV-2/FLU/RSV plus assay is intended as an aid in the diagnosis of influenza from Nasopharyngeal swab specimens and should not be used as a sole basis for treatment. Nasal washings and aspirates are unacceptable for Xpert Xpress SARS-CoV-2/FLU/RSV testing.  Fact Sheet for Patients: EntrepreneurPulse.com.au  Fact Sheet for Healthcare Providers: IncredibleEmployment.be  This test is not yet approved or cleared by the [place] FDA and has been authorized for detection and/or diagnosis of SARS-CoV-2 by FDA under an Emergency Use Authorization (EUA). This EUA will remain in effect (meaning this test can be used) for the duration of the COVID-19 declaration under Section 564(b)(1) of the Act, 21 U.S.C. section 360bbb-3(b)(1), unless the authorization is terminated or revoked.     Resp Syncytial Virus by PCR NEGATIVE NEGATIVE Final    Comment: (NOTE) Fact Sheet for Patients: EntrepreneurPulse.com.au  Fact Sheet for Healthcare Providers: IncredibleEmployment.be  This test is not yet approved or cleared by the [place] FDA and has been authorized for detection and/or diagnosis of SARS-CoV-2 by FDA under an Emergency Use Authorization (EUA). This EUA will remain in effect (meaning this test can be used) for the duration of the COVID-19 declaration under Section 564(b)(1) of the Act, 21 U.S.C. section 360bbb-3(b)(1), unless the authorization is terminated or revoked.  Performed at [facility], [address] [place], [place] [address]   Culture, blood (Routine X 2) w Reflex to ID Panel     Status: None   Collection Time: 09/28/22  5:20 PM   Specimen: BLOOD  Result Value  Ref Range Status   Specimen Description BLOOD BLOOD RIGHT HAND  Final   Special Requests   Final    BOTTLES DRAWN AEROBIC AND ANAEROBIC Blood Culture adequate volume   Culture   Final    NO GROWTH 5 DAYS Performed at [facility], [address], [place], [place] [address]    Report Status 10/03/2022 FINAL  Final  Culture, blood (Routine X 2) w Reflex to ID Panel     Status: None   Collection Time: 09/28/22  5:20 PM   Specimen: BLOOD  Result Value Ref Range Status   Specimen Description BLOOD BLOOD LEFT HAND  Final   Special Requests   Final    BOTTLES DRAWN AEROBIC AND ANAEROBIC Blood Culture adequate volume   Culture   Final    NO GROWTH 5 DAYS Performed at [facility], [address], [place], [place] [address]    Report Status 10/03/2022 FINAL  Final  MRSA Next Gen by PCR, Nasal     Status: None   Collection Time: 09/28/22  7:01 PM   Specimen: Nasal Mucosa; Nasal Swab  Result Value Ref Range Status   MRSA by PCR Next Gen NOT DETECTED NOT DETECTED Final    Comment: (NOTE) The GeneXpert MRSA Assay (FDA approved for NASAL specimens only), is one component of a comprehensive MRSA colonization surveillance program. It is not intended to diagnose MRSA infection nor to guide or monitor treatment for MRSA infections. Test performance is not FDA approved in patients less than 58 years old. Performed at [facility], [address] [place], [place] [address]   [place] prep     Status: None   Collection Time: 10/05/22 12:47 PM   Specimen: PATH GI Other  Result Value Ref Range Status   Specimen Description ESOPHAGUS  Final   Special Requests NONE  Final   KOH Prep   Final    NO YEAST OR FUNGAL ELEMENTS SEEN Performed at [facility], [address], [place], [place] [address]    Report Status 10/05/2022 FINAL  Final     Radiology Studies: No results found.  Scheduled Meds:  carvedilol  12.5 mg Oral BID   Chlorhexidine Gluconate Cloth  6 each Topical Daily   diltiazem  180 mg  Oral Q12H   ipratropium  0.5 mg Nebulization TID   levalbuterol  0.63 mg Nebulization TID   levothyroxine  75 mcg Per Tube QAC breakfast   pantoprazole (PROTONIX) IV  40 mg Intravenous Q12H   polyethylene glycol  17 g Per Tube Daily   Continuous Infusions:  sodium chloride     lactated ringers     magnesium sulfate bolus IVPB      LOS: 8 days   Barton Dubois M.D on 10/06/2022 at 3:55 PM  Go to [email or web address] - for contact info  [facility] - Office  [phone]  If 7PM-7AM, please contact night-coverage [email or web address] 10/06/2022, 3:55 PM

## 2022-10-06 NOTE — Progress Notes (Signed)
Pt hasn't slept all night, still very confused, notified Dr. Josph Macho, also pt has had frequent multifocal PVC's and cuplets, per Dr. Earnest Conroy will order BMP and Mag.

## 2022-10-06 NOTE — Progress Notes (Signed)
Gastroenterology Progress Note   Referring Provider: No ref. provider found Primary Care Physician:  Anthony Hilding, MD Primary Gastroenterologist:  Anthony Quale, MD  Patient ID: [name]; [id number]; Feb 22, 1931    Subjective   Patient confused.  Does complain of some mid lower abdominal pain however possibly related to distended bladder as he feels like he needs to pee and cannot.  Nurse planning to BladderScan him soon.  Wife is at bedside and reports that he has been confused intermittently overnight as well.  Seems to be doing well from a respiratory standpoint off any oxygen.  Able to tolerate clear liquids yesterday.  Will attempt to eat soft foods this morning.  Patient does answer no to having any epigastric pain or any throat pain.  Nurse reports a bowel movement yesterday but nothing today.   Objective   Vital signs in last 24 hours Temp:  [97.6 F (36.4 C)-98.9 F (37.2 C)] 97.9 F (36.6 C) (12/22 0800) Pulse Rate:  [36-128] 95 (12/22 0900) Resp:  [11-28] 16 (12/22 0900) BP: (91-190)/(55-92) 159/76 (12/22 0900) SpO2:  [91 %-100 %] 100 % (12/22 0900) Weight:  [71 kg] 71 kg (12/21 1210) Last BM Date : 10/05/22  Physical Exam General:  Drowsy, pleasant and cooperative.  Head:  Normocephalic and atraumatic. Eyes:  No icterus, sclera clear. Conjuctiva pink.  Mouth:  Without lesions, mucosa pink and moist.  Heart:  irregularly irregular Lungs:  Mild rhonchi bilaterally, diminished bases Abdomen:  Bowel sounds present, soft, non-distended. Mild ttp to lower mid abdomen. No HSM or hernias noted. No rebound or guarding. No masses appreciated Extremities:  Without clubbing or edema. Neurologic:  Drowsy, oriented to self only.  Psych:  Drowsy and cooperative. Normal mood.  Intake/Output from previous day: 12/21 0701 - 12/22 0700 In: 447 [P.O.:240; I.V.:207] Out: 1501 [Urine:1500; Blood:1] Intake/Output this shift: No intake/output data recorded.  Lab  Results  Recent Labs    10/04/22 0307 10/05/22 0531  WBC 7.1 9.0  HGB 9.1* 9.4*  HCT 27.0* 28.4*  PLT 271 279   BMET Recent Labs    10/04/22 0307 10/06/22 0306  NA 140 135  K 3.8 3.2*  CL 111 105  CO2 24 24  GLUCOSE 96 95  BUN 27* 10  CREATININE 1.19 1.10  CALCIUM 7.8* 7.9*   LFT Recent Labs    10/04/22 0307  ALBUMIN 2.2*   PT/INR Recent Labs    10/05/22 0333 10/06/22 0306  LABPROT 16.8* 17.0*  INR 1.4* 1.4*   Hepatitis Panel No results for input(s): "HEPBSAG", "HCVAB", "HEPAIGM", "HEPBIGM" in the last 72 hours.  Studies/Results DG ABD ACUTE 2+V W 1V CHEST  Result Date: 10/04/2022 CLINICAL DATA:  Emesis EXAM: DG ABDOMEN ACUTE WITH 1 VIEW CHEST COMPARISON:  10/02/2022 FINDINGS: There is no evidence of dilated bowel loops or free intraperitoneal air. No radiopaque calculi or other significant radiographic abnormality is seen. Bibasilar pulmonary parenchymal consolidation. Bilateral pleural effusion. Mild vascular congestion. Calcified aorta. NG tube tip is superimposed with the stomach. IMPRESSION: Nonspecific bowel gas pattern. No free air. Bibasilar pulmonary parenchymal consolidation and pleural effusions. Electronically Signed   By: Sammie Bench M.D.   On: 10/04/2022 10:48   DG ABD ACUTE 2+V W 1V CHEST  Result Date: 10/02/2022 CLINICAL DATA:  Abdominal distension EXAM: DG ABDOMEN ACUTE WITH 1 VIEW CHEST COMPARISON:  Acute abdomen series dated 09/29/2022. FINDINGS: Single-view of the chest: Heart size and mediastinal contours are stable. LEFT basilar opacity, likely atelectasis and/or small  pleural effusion. Lungs otherwise clear. No pneumothorax is seen. Supine and upright views of the abdomen: Enteric tube is stable in position, loosely coiled in the stomach. No dilated large or small bowel loops are seen. No evidence of free intraperitoneal air. IMPRESSION: 1. LEFT basilar opacity, likely mild atelectasis and/or small pleural effusion. 2. Nonobstructive  bowel gas pattern. Enteric tube is stable in position, loosely coiled in the stomach. Electronically Signed   By: Franki Cabot M.D.   On: 10/02/2022 11:30   DG Chest 1 View  Result Date: 10/02/2022 CLINICAL DATA:  NG tube placement EXAM: CHEST  1 VIEW COMPARISON:  10/01/2022 FINDINGS: Small bilateral pleural effusions, right greater than left, mildly progressive. No frank interstitial edema. Heart is normal in size. Enteric tube coursing into the mid stomach. IMPRESSION: Small bilateral pleural effusions, right greater than left, mildly progressive. Enteric tube coursing into the mid stomach. Electronically Signed   By: Julian Hy M.D.   On: 10/02/2022 00:56   DG Chest 1 View  Result Date: 10/01/2022 CLINICAL DATA:  Shortness of breath EXAM: CHEST  1 VIEW COMPARISON:  Chest x-ray 09/30/2022 FINDINGS: There are patchy opacities in the retrocardiac region. There is a band of atelectasis in the right mid lung. Heart is enlarged. There is no pleural effusion or pneumothorax. No acute fractures are seen. IMPRESSION: Patchy opacities in the retrocardiac region, which may represent atelectasis or infection. Electronically Signed   By: Ronney Asters M.D.   On: 10/01/2022 20:26   ECHOCARDIOGRAM COMPLETE  Result Date: 10/01/2022    ECHOCARDIOGRAM REPORT   Patient Name:   [name] Date of Exam: 10/01/2022 Medical Rec #:  [id number]     Height:       68.0 in Accession #:    [id number]    Weight:       160.9 lb Date of Birth:  [birth year]     BSA:          1.863 m Patient Age:    14 years      BP:           126/61 mmHg Patient Gender: M             HR:           78 bpm. Exam Location:  [facility] Procedure: 2D Echo, Cardiac Doppler and Color Doppler Indications:    I48.91* Unspeicified atrial fibrillation  History:        Patient has prior history of Echocardiogram examinations, most                 recent 05/19/2020. CHF, CAD, Abnormal ECG, Arrythmias:Atrial                 Fibrillation;  Signs/Symptoms:Bacteremia, Shortness of Breath and                 Dyspnea. Pneumonia.  Sonographer:    Roseanna Rainbow RDCS Referring Phys: [id number] [facility]  Sonographer Comments: Technically difficult study due to poor echo windows and suboptimal subcostal window. IMPRESSIONS  1. Left ventricular ejection fraction, by estimation, is 50 to 55%. The left ventricle has low normal function. The left ventricle has no regional wall motion abnormalities. Left ventricular diastolic parameters are indeterminate.  2. Right ventricular systolic function is normal. The right ventricular size is normal. There is normal pulmonary artery systolic pressure.  3. Large pleural effusion in the left lateral region.  4. The mitral valve is grossly normal. Mild mitral valve regurgitation.  No evidence of mitral stenosis.  5. The aortic valve is tricuspid. There is mild calcification of the aortic valve. There is mild thickening of the aortic valve. Aortic valve regurgitation is trivial. No aortic stenosis is present.  6. The inferior vena cava is dilated in size with >50% respiratory variability, suggesting right atrial pressure of 8 mmHg. Comparison(s): Large left sided pleural effusion is new. FINDINGS  Left Ventricle: Left ventricular ejection fraction, by estimation, is 50 to 55%. The left ventricle has low normal function. The left ventricle has no regional wall motion abnormalities. The left ventricular internal cavity size was normal in size. Suboptimal image quality limits for assessment of left ventricular hypertrophy. Left ventricular diastolic parameters are indeterminate. Right Ventricle: The right ventricular size is normal. No increase in right ventricular wall thickness. Right ventricular systolic function is normal. There is normal pulmonary artery systolic pressure. The tricuspid regurgitant velocity is 2.46 m/s, and  with an assumed right atrial pressure of 8 mmHg, the estimated right ventricular systolic pressure is  32.2 mmHg. Left Atrium: Left atrial size was normal in size. Right Atrium: Right atrial size was normal in size. Pericardium: Trivial pericardial effusion is present. Presence of epicardial fat layer. Mitral Valve: The mitral valve is grossly normal. Mild mitral valve regurgitation. No evidence of mitral valve stenosis. Tricuspid Valve: The tricuspid valve is normal in structure. Tricuspid valve regurgitation is trivial. No evidence of tricuspid stenosis. Aortic Valve: The aortic valve is tricuspid. There is mild calcification of the aortic valve. There is mild thickening of the aortic valve. Aortic valve regurgitation is trivial. No aortic stenosis is present. Pulmonic Valve: The pulmonic valve was normal in structure. Pulmonic valve regurgitation is mild. No evidence of pulmonic stenosis. Aorta: The aortic root and ascending aorta are structurally normal, with no evidence of dilitation. Venous: The inferior vena cava is dilated in size with greater than 50% respiratory variability, suggesting right atrial pressure of 8 mmHg. IAS/Shunts: No atrial level shunt detected by color flow Doppler. Additional Comments: There is a large pleural effusion in the left lateral region.  LEFT VENTRICLE PLAX 2D LVIDd:         4.40 cm LVIDs:         3.00 cm LV PW:         1.20 cm LV IVS:        0.90 cm LVOT diam:     2.20 cm LV SV:         57 LV SV Index:   30 LVOT Area:     3.80 cm  LV Volumes (MOD) LV vol d, MOD A2C: 91.2 ml LV vol d, MOD A4C: 66.2 ml LV vol s, MOD A2C: 42.9 ml LV vol s, MOD A4C: 27.7 ml LV SV MOD A2C:     48.3 ml LV SV MOD A4C:     66.2 ml LV SV MOD BP:      43.2 ml RIGHT VENTRICLE            IVC RV S prime:     9.14 cm/s  IVC diam: 2.40 cm TAPSE (M-mode): 1.5 cm LEFT ATRIUM           Index        RIGHT ATRIUM           Index LA diam:      4.60 cm 2.47 cm/m   RA Area:     15.80 cm LA Vol (A2C): 49.1 ml 26.35 ml/m  RA Volume:  37.10 ml  19.91 ml/m LA Vol (A4C): 46.6 ml 25.01 ml/m  AORTIC VALVE              PULMONIC VALVE LVOT Vmax:   88.35 cm/s  PR End Diast Vel: 1.27 msec LVOT Vmean:  59.600 cm/s LVOT VTI:    0.150 m  AORTA Ao Root diam: 3.20 cm Ao Asc diam:  3.70 cm MITRAL VALVE               TRICUSPID VALVE MV Area (PHT): 4.06 cm    TR Peak grad:   24.2 mmHg MV Decel Time: 187 msec    TR Vmax:        246.00 cm/s MV E velocity: 79.00 cm/s                            SHUNTS                            Systemic VTI:  0.15 m                            Systemic Diam: 2.20 cm Rudean Haskell MD Electronically signed by Rudean Haskell MD Signature Date/Time: 10/01/2022/7:01:30 PM    Final    DG Chest Port 1 View  Result Date: 09/30/2022 CLINICAL DATA:  Shortness of breath. EXAM: PORTABLE CHEST 1 VIEW COMPARISON:  CT of the chest September 28, 2022. KUB and chest x-ray September 29, 2022. FINDINGS: Dilated stomach and bowel loops in the upper abdomen. The cardiomediastinal silhouette is stable. No pneumothorax. No nodules or masses. Bibasilar opacities consistent with multifocal pneumonia on the recent chest CT have not been well appreciated on chest x-rays but there is no significant change. A linear opacity in the right mid lung is likely fluid in the fissure. IMPRESSION: 1. Bibasilar opacities consistent with multifocal pneumonia on the recent chest CT have not been well appreciated on chest x-rays but there is no significant change. 2. Dilated stomach and bowel loops in the upper abdomen. Recommend dedicated imaging of the abdomen. Electronically Signed   By: Dorise Bullion III M.D.   On: 09/30/2022 19:33   DG ABD ACUTE 2+V W 1V CHEST  Result Date: 09/29/2022 CLINICAL DATA:  Dyspnea and respiratory abnormality EXAM: DG ABDOMEN ACUTE WITH 1 VIEW CHEST COMPARISON:  Radiograph 09/28/2022 FINDINGS: Unchanged cardiomediastinal silhouette. There is bibasilar airspace disease, as seen on recent CT. No large effusion. No evidence of pneumothorax. No acute osseous abnormality. Thoracic spondylosis. Distension  of bowel in the upper abdomen. IMPRESSION: Bibasilar airspace disease, as seen on recent CT, consistent with multifocal pneumonia/aspiration. Electronically Signed   By: Maurine Simmering M.D.   On: 09/29/2022 10:04   CT Angio Chest PE W/Cm &/Or Wo Cm  Result Date: 09/28/2022 CLINICAL DATA:  Two-week history of cough with acute onset nausea/vomiting EXAM: CT ANGIOGRAPHY CHEST WITH CONTRAST TECHNIQUE: Multidetector CT imaging of the chest was performed using the standard protocol during bolus administration of intravenous contrast. Multiplanar CT image reconstructions and MIPs were obtained to evaluate the vascular anatomy. RADIATION DOSE REDUCTION: This exam was performed according to the departmental dose-optimization program which includes automated exposure control, adjustment of the mA and/or kV according to patient size and/or use of iterative reconstruction technique. CONTRAST:  29m OMNIPAQUE IOHEXOL 350 MG/ML SOLN COMPARISON:  Chest radiograph dated 09/28/2022, CT abdomen and pelvis dated  04/11/2022 FINDINGS: Cardiovascular: The study is high quality for the evaluation of pulmonary embolism. There are no filling defects in the central, lobar, segmental or subsegmental pulmonary artery branches to suggest acute pulmonary embolism. Great vessels are normal in course and caliber. Left atrial enlargement. No significant pericardial fluid/thickening. Coronary artery calcifications and aortic atherosclerosis. Mediastinum/Nodes: Thyroid gland without nodules meeting criteria for imaging follow-up by size. Patulous esophagus containing layering secretions. No pathologically enlarged axillary, supraclavicular, mediastinal, or hilar lymph nodes. Lungs/Pleura: The central airways are patent. Trace layering secretions within the trachea. Mild bronchial wall thickening. Ground-glass densities and consolidation involving the lower lobes bilaterally. Subsegmental atelectasis of the lingula and middle lobe with additional  scattered ground-glass nodules in the right upper lobe. No pneumothorax. No pleural effusion. Upper abdomen: Subcentimeter hypodensities in the right hepatic lobe (5:92), too small to characterize but unchanged from prior examinations. Cholecystectomy. 2.2 cm peripherally calcified medial splenic cyst is again seen (5:90). Findings of prior bowel resection in the right upper quadrant. Musculoskeletal: No acute or abnormal lytic or blastic osseous lesions. Review of the MIP images confirms the above findings. IMPRESSION: 1. No evidence of pulmonary embolism. 2. Ground-glass densities and consolidation involving the lower lobes bilaterally with additional scattered ground-glass nodules in the right upper lobe. Findings are suspicious for multifocal infection/aspiration. 3. Coronary artery calcifications. Aortic Atherosclerosis (ICD10-I70.0). Electronically Signed   By: Darrin Nipper M.D.   On: 09/28/2022 14:30   DG Chest 2 View  Result Date: 09/28/2022 CLINICAL DATA:  Dyspnea, nausea, vomiting EXAM: CHEST - 2 VIEW COMPARISON:  08/27/2022 FINDINGS: Normal heart size, mediastinal contours, and pulmonary vascularity. Minimal bibasilar atelectasis. Lungs otherwise clear. No pulmonary infiltrate, pleural effusion, or pneumothorax. Bowel interposition between liver and diaphragm. No osseous abnormalities. IMPRESSION: Minimal bibasilar atelectasis. Electronically Signed   By: Lavonia Dana M.D.   On: 09/28/2022 09:30    Assessment  88 y.o. male with a history of small bowel Crohn's disease s/p resection complicated by recurrent stricture on Humira, C. difficile colitis, BPH, CAD, A-fib, GERD, hypothyroidism, partial SBO in June 2023 on CT, presenting this admission with sepsis due to multifocal pneumonia, A-fib with RVR.  GI consulted due to intractable emesis, hematemesis.  Hematemesis: Reported multiple episodes of emesis prior to admission, coffee-ground emesis noted in the ED yesterday and throughout hospitalization.   NG tube previously in place with dark black/green output.  Hemoglobin 14.6 on admission with drop down to 9.  Stable at 9.4 yesterday.  No CBC on file for today, will check.  Prior EGD in 2013.  Abdominal imaging 12/15 with distention of bowel in the upper abdomen.  Repeat KUB Monday with nonobstructive bowel gas pattern, similar appearance on x-ray 12/20 as well.  NG tube removed on 12/20. Underwent EGD 10/05/22 with grade D esophagitis with no bleeding, brushings performed, erythematous mucosa in the gastric body, nodular mucosa in the second portion of duodenum s/p biopsy, single duodenal polyp removed and clip placed.  Has had no recurrence of melena or hematemesis.  Tolerating full liquid diet yesterday.  Will advance to soft diet today.  Continue PPI twice daily and treat with super fate 1 g every 6 hours for 1 month.  Will plan for repeat EGD in 1 month and continue to monitor H/H.  Would recommend holding Coumadin until after 12/24.  Crohn's disease: Maintained on Humira, last dose due this past Saturday however missed due to admission.   Plan / Recommendations  H/H today Advance to soft diet Continue to monitor  H/H, transfuse for hemoglobin <7 PPI twice daily Sucralfate 1 g every 6 hours for 1 month. Repeat EGD in 3 months Okay to restart Coumadin after 12/24 Follow up outpatient in 3-4 weeks.   GI service will sign off at this time.  Please reach out if any further assistance needed.     LOS: 8 days   10/06/2022, 9:38 AM  Venetia Night, MSN, FNP-BC, AGACNP-BC [facility]

## 2022-10-06 NOTE — Telephone Encounter (Signed)
Please make patient a hospital follow up in 3-4 weeks with [place], Dr. Jenetta Downer, or other APP  Venetia Night, MSN, APRN, FNP-BC, AGACNP-BC [facility]

## 2022-10-06 NOTE — Anesthesia Postprocedure Evaluation (Signed)
Anesthesia Post Note  Patient: [name]  Procedure(s) Performed: ESOPHAGOGASTRODUODENOSCOPY (EGD) WITH PROPOFOL ESOPHAGEAL BRUSHING BIOPSY POLYPECTOMY HEMOSTASIS CLIP PLACEMENT  Patient location during evaluation: Phase II Anesthesia Type: General Level of consciousness: awake Pain management: pain level controlled Vital Signs Assessment: post-procedure vital signs reviewed and stable Respiratory status: spontaneous breathing and respiratory function stable Cardiovascular status: blood pressure returned to baseline and stable Postop Assessment: no headache and no apparent nausea or vomiting Anesthetic complications: no Comments: Late entry   No notable events documented.   Last Vitals:  Vitals:   10/06/22 1400 10/06/22 1425  BP: (!) 144/70   Pulse: 76   Resp: 16   Temp:    SpO2: 98% 97%    Last Pain:  Vitals:   10/06/22 1230  TempSrc: Axillary  PainSc:                  Louann Sjogren

## 2022-10-06 NOTE — Care Management Important Message (Signed)
Important Message  Patient Details  Name: [name] MRN: [id number] Date of Birth: 1931-04-10   Medicare Important Message Given:  Yes     Tommy Medal 10/06/2022, 9:45 AM

## 2022-10-07 DIAGNOSIS — J189 Pneumonia, unspecified organism: Secondary | ICD-10-CM | POA: Diagnosis not present

## 2022-10-07 LAB — BASIC METABOLIC PANEL
Anion gap: 5 (ref 5–15)
BUN: 8 mg/dL (ref 8–23)
CO2: 24 mmol/L (ref 22–32)
Calcium: 8 mg/dL — ABNORMAL LOW (ref 8.9–10.3)
Chloride: 110 mmol/L (ref 98–111)
Creatinine, Ser: 1.09 mg/dL (ref 0.61–1.24)
GFR, Estimated: >60 mL/min (ref >60)
Glucose, Bld: 96 mg/dL (ref 70–99)
Potassium: 3.7 mmol/L (ref 3.5–5.1)
Sodium: 139 mmol/L (ref 135–145)

## 2022-10-07 LAB — GLUCOSE, CAPILLARY
Glucose-Capillary: 99 mg/dL (ref 70–99)
Glucose-Capillary: 87 mg/dL (ref 70–99)

## 2022-10-07 LAB — PROTIME-INR
INR: 1.2 (ref 0.8–1.2)
Prothrombin Time: 15.4 seconds — ABNORMAL HIGH (ref 11.4–15.2)

## 2022-10-07 MED ORDER — PANTOPRAZOLE SODIUM 40 MG PO TBEC
40.0000 mg | DELAYED_RELEASE_TABLET | Freq: Two times a day (BID) | ORAL | Status: DC
  Administered 2022-10-07 – 2022-10-10 (×6): 40 mg via ORAL
  Filled 2022-10-07 (×6): qty 1

## 2022-10-07 MED ORDER — ONDANSETRON HCL 4 MG PO TABS: 4.0000 mg | ORAL_TABLET | Freq: Four times a day (QID) | ORAL | Status: DC | PRN

## 2022-10-07 MED ORDER — ONDANSETRON HCL 4 MG/2ML IJ SOLN: 4.0000 mg | Freq: Four times a day (QID) | INTRAMUSCULAR | Status: DC | PRN

## 2022-10-07 MED ORDER — ACETAMINOPHEN 325 MG PO TABS: 650.0000 mg | ORAL_TABLET | Freq: Four times a day (QID) | ORAL | Status: DC | PRN

## 2022-10-07 MED ORDER — POLYETHYLENE GLYCOL 3350 17 G PO PACK
17.0000 g | PACK | Freq: Every day | ORAL | Status: DC
  Administered 2022-10-08: 17 g via ORAL
  Filled 2022-10-07 (×3): qty 1

## 2022-10-07 MED ORDER — ACETAMINOPHEN 650 MG RE SUPP: 650.0000 mg | Freq: Four times a day (QID) | RECTAL | Status: DC | PRN

## 2022-10-07 NOTE — Progress Notes (Signed)
PROGRESS NOTE    [name]  FOY:[id number] DOB: 04-Aug-1931 DOA: 09/28/2022 PCP: Manon Hilding, MD   Brief Narrative:    88 y.o. male with medical history significant for Atria Fibrillation, Crohn's, diastolic CHF, CAD admitted on 09/28/22 with multifocal pneumonia and A-fib with RVR.  His heart rate is better controlled and he has had some difficulty with sundowning.  He is awaiting SNF placement at this point.  Assessment & Plan:   Principal Problem:   Multifocal pneumonia Active Problems:   Sepsis ([place])   Atrial fibrillation with RVR (HCC)   CAD (coronary artery disease)   Warfarin anticoagulation   Crohn's disease ([place])   Chronic diastolic heart failure (HCC)   Hematemesis   ABLA (acute blood loss anemia)   Adenomatous duodenal polyp  Assessment and Plan:   1)Multifocal Pneumonia--- suspect aspiration component in the setting of recurrent emesis Admission CTA chest-multifocal pneumonia/aspiration -Continue antibiotics; plan is to complete total of 10 days. -Patient has completed azithromycin  -[place] diet as per gastroenterology recommendations. -Endoscopic evaluation by GI later today. -Continue bronchodilators and mucolytics -Leukocytosis has resolved   2)Atrial fibrillation with RVR ([place]) -Currently stable and well-controlled -Planning to transition of Cardizem drip into oral route and continue the use of Coreg. -Coumadin per pharmacy for stroke prophylaxis --INR therapeutic currently.   3)Acute blood loss anemia secondary to acute GI bleed/intractable emesis --- -Baseline Hgb usually around 13-14 --No further coffee-ground emesis with NG tube in situ -INR trending down appropriately after vitamin K -Abdominal x-rays on 10/02/2022 without obstructive findings -Continue antiemetics as needed -Hgb stable over the last 24 hours after initially dropping. -Successful removal of NG tube on 10/04/2022 -Status post endoscopy evaluation on 10/05/2022  demonstrating: grade D esophagitis without acute bleeding.  There was erythematous changes in the gastric body and nodular mucosa in the second portion of the duodenum status post biopsies. -Follow recommendations by gastroenterology service continue PPI and advance diet.   4)Acute Hypoxic Respiratory Failure--- secondary to mostly #1 and #2 above -Patient required 2 L nasal cannula supplementation; has been able to be weaned off to room air currently; he denies shortness of breath currently. -Continue monitoring.   5)Crohn's disease ([place]) Stable.  On Humira PTA; planning to resume at discharge. -Will advise holding Humira given acute infection at this time   6)CAD (coronary artery disease) Follows with Dr. Domenic Polite.  Mild/moderate nonobstructive disease 2004.  No chest pain.  EKG unchanged. -Continue Coreg, no aspirin as patient is already on Coumadin -Echo as below #8   7)Hypothyroidism- -continue levothyroxine   8) Chronic diastolic heart failure (HCC) Stable and compensated.  Not on diuretics.   -Repeat echo on 10/01/2022 with EF of 50 to 55% which is similar to prior -Chest x-ray with small pleural effusion but no interstitial edema -Resewn diuretics when fully able to tolerate by mouth.   9) generalized weakness and deconditioning--- PT eval appreciated recommends SNF rehab   10) sundowning: -Avoid the use of benzodiazepines -As needed Haldol has been ordered -Continue constant reorientation and supportive care.   DVT prophylaxis: Coumadin to be resumed after 12/24 Code Status: Full Family Communication: Wife, Inez Catalina at bedside 12/23 Disposition Plan: SNF placement Status is: Inpatient Remains inpatient appropriate because: Altered mentation with plans for SNF placement.  IV medications.   Consultants:  GI  Procedures:  EGD 12/21  Antimicrobials:  Anti-infectives (From admission, onward)    Start     Dose/Rate Route Frequency Ordered Stop   10/03/22 1130  cefTRIAXone (ROCEPHIN) 1 g in sodium chloride 0.9 % 100 mL IVPB        1 g 200 mL/hr over 30 Minutes Intravenous Every 24 hours 10/03/22 1015 10/05/22 1210   09/29/22 1500  cefTRIAXone (ROCEPHIN) 2 g in sodium chloride 0.9 % 100 mL IVPB        2 g 200 mL/hr over 30 Minutes Intravenous Every 24 hours 09/28/22 1900 10/02/22 1630   09/29/22 1500  azithromycin (ZITHROMAX) 500 mg in sodium chloride 0.9 % 250 mL IVPB  Status:  Discontinued        500 mg 250 mL/hr over 60 Minutes Intravenous Every 24 hours 09/28/22 1900 09/29/22 0849   09/29/22 1500  azithromycin (ZITHROMAX) 500 mg in sodium chloride 0.9 % 250 mL IVPB        500 mg 250 mL/hr over 60 Minutes Intravenous Every 24 hours 09/29/22 0849 10/02/22 1739   09/28/22 1515  cefTRIAXone (ROCEPHIN) 2 g in sodium chloride 0.9 % 100 mL IVPB        2 g 200 mL/hr over 30 Minutes Intravenous  Once 09/28/22 1500 09/28/22 1640   09/28/22 1515  azithromycin (ZITHROMAX) 500 mg in sodium chloride 0.9 % 250 mL IVPB        500 mg 250 mL/hr over 60 Minutes Intravenous  Once 09/28/22 1500 09/28/22 1837      Subjective: Patient seen and evaluated today with no new acute complaints or concerns. No acute concerns or events noted overnight.  He is resting comfortably with wife at bedside.  He continues to have issues with sundowning at nighttime.  Objective: Vitals:   10/07/22 1100 10/07/22 1200 10/07/22 1300 10/07/22 1400  BP: 131/87 (!) 133/54 (!) 139/54 122/65  Pulse:      Resp:      Temp:      TempSrc:      SpO2: 99% 97%    Weight:      Height:        Intake/Output Summary (Last 24 hours) at 10/07/2022 1428 Last data filed at 10/07/2022 0402 Gross per 24 hour  Intake --  Output 700 ml  Net -700 ml   Filed Weights   10/05/22 0500 10/05/22 1210 10/07/22 0500  Weight: 71.5 kg 71 kg 67.1 kg    Examination:  General exam: Appears calm and comfortable  Respiratory system: Clear to auscultation. Respiratory effort normal.  Currently  receiving breathing treatment. Cardiovascular system: S1 & S2 heard, RRR.  Gastrointestinal system: Abdomen is soft Central nervous system: Alert and awake Extremities: No edema Skin: No significant lesions noted Psychiatry: Flat affect.    Data Reviewed: I have personally reviewed following labs and imaging studies  CBC: Recent Labs  Lab 10/01/22 0422 10/02/22 1559 10/03/22 0311 10/04/22 0307 10/05/22 0531 10/06/22 1004  WBC 11.3* 8.8 7.6 7.1 9.0  --   NEUTROABS  --  5.4  --  3.0 4.5  --   HGB 11.3* 9.4* 9.0* 9.1* 9.4* 10.6*  HCT 32.8* 27.0* 26.1* 27.0* 28.4* 30.7*  MCV 91.1 90.3 91.9 93.8 95.0  --   PLT 187 209 232 271 279  --    Basic Metabolic Panel: Recent Labs  Lab 10/01/22 0422 10/03/22 0311 10/04/22 0307 10/06/22 0306 10/07/22 0315  NA 134* 139 140 135 139  K 3.7 3.0* 3.8 3.2* 3.7  CL 104 109 111 105 110  CO2 21* 24 24 24 24   GLUCOSE 133* 97 96 95 96  BUN 54* 50* 27* 10 8  CREATININE 1.61* 1.48* 1.19 1.10 1.09  CALCIUM 8.2* 7.8* 7.8* 7.9* 8.0*  MG 2.5*  --   --  1.5*  --   PHOS 2.5  --  1.8*  --   --    GFR: Estimated Creatinine Clearance: 43.6 mL/min (by C-G formula based on SCr of 1.09 mg/dL). Liver Function Tests: Recent Labs  Lab 10/01/22 0422 10/03/22 0311 10/04/22 0307  AST  --  17  --   ALT  --  17  --   ALKPHOS  --  53  --   BILITOT  --  0.6  --   PROT  --  4.8*  --   ALBUMIN 2.6* 2.3* 2.2*   No results for input(s): "LIPASE", "AMYLASE" in the last 168 hours. No results for input(s): "AMMONIA" in the last 168 hours. Coagulation Profile: Recent Labs  Lab 10/03/22 0311 10/04/22 0307 10/05/22 0333 10/06/22 0306 10/07/22 0315  INR 1.7* 1.4* 1.4* 1.4* 1.2   Cardiac Enzymes: No results for input(s): "CKTOTAL", "CKMB", "CKMBINDEX", "TROPONINI" in the last 168 hours. BNP (last 3 results) No results for input(s): "PROBNP" in the last 8760 hours. HbA1C: No results for input(s): "HGBA1C" in the last 72 hours. CBG: Recent Labs  Lab  10/06/22 1144 10/06/22 1525 10/06/22 2116 10/07/22 0118 10/07/22 0509  GLUCAP 81 87 106* 99 87   Lipid Profile: No results for input(s): "CHOL", "HDL", "LDLCALC", "TRIG", "CHOLHDL", "LDLDIRECT" in the last 72 hours. Thyroid Function Tests: No results for input(s): "TSH", "T4TOTAL", "FREET4", "T3FREE", "THYROIDAB" in the last 72 hours. Anemia Panel: No results for input(s): "VITAMINB12", "FOLATE", "FERRITIN", "TIBC", "IRON", "RETICCTPCT" in the last 72 hours. Sepsis Labs: No results for input(s): "PROCALCITON", "LATICACIDVEN" in the last 168 hours.  Recent Results (from the past 240 hour(s))  Resp panel by RT-PCR (RSV, Flu A&B, Covid) Anterior Nasal Swab     Status: None   Collection Time: 09/28/22  9:45 AM   Specimen: Anterior Nasal Swab  Result Value Ref Range Status   SARS Coronavirus 2 by RT PCR NEGATIVE NEGATIVE Final    Comment: (NOTE) SARS-CoV-2 target nucleic acids are NOT DETECTED.  The SARS-CoV-2 RNA is generally detectable in upper respiratory specimens during the acute phase of infection. The lowest concentration of SARS-CoV-2 viral copies this assay can detect is 138 copies/mL. A negative result does not preclude SARS-Cov-2 infection and should not be used as the sole basis for treatment or other patient management decisions. A negative result may occur with  improper specimen collection/handling, submission of specimen other than nasopharyngeal swab, presence of viral mutation(s) within the areas targeted by this assay, and inadequate number of viral copies(<138 copies/mL). A negative result must be combined with clinical observations, patient history, and epidemiological information. The expected result is Negative.  Fact Sheet for Patients:  EntrepreneurPulse.com.au  Fact Sheet for Healthcare Providers:  IncredibleEmployment.be  This test is no t yet approved or cleared by the [place] FDA and  has been authorized  for detection and/or diagnosis of SARS-CoV-2 by FDA under an Emergency Use Authorization (EUA). This EUA will remain  in effect (meaning this test can be used) for the duration of the COVID-19 declaration under Section 564(b)(1) of the Act, 21 U.S.C.section 360bbb-3(b)(1), unless the authorization is terminated  or revoked sooner.       Influenza A by PCR NEGATIVE NEGATIVE Final   Influenza B by PCR NEGATIVE NEGATIVE Final    Comment: (NOTE) The Xpert Xpress SARS-CoV-2/FLU/RSV plus assay is intended as an aid  in the diagnosis of influenza from Nasopharyngeal swab specimens and should not be used as a sole basis for treatment. Nasal washings and aspirates are unacceptable for Xpert Xpress SARS-CoV-2/FLU/RSV testing.  Fact Sheet for Patients: EntrepreneurPulse.com.au  Fact Sheet for Healthcare Providers: IncredibleEmployment.be  This test is not yet approved or cleared by the [place] FDA and has been authorized for detection and/or diagnosis of SARS-CoV-2 by FDA under an Emergency Use Authorization (EUA). This EUA will remain in effect (meaning this test can be used) for the duration of the COVID-19 declaration under Section 564(b)(1) of the Act, 21 U.S.C. section 360bbb-3(b)(1), unless the authorization is terminated or revoked.     Resp Syncytial Virus by PCR NEGATIVE NEGATIVE Final    Comment: (NOTE) Fact Sheet for Patients: EntrepreneurPulse.com.au  Fact Sheet for Healthcare Providers: IncredibleEmployment.be  This test is not yet approved or cleared by the [place] FDA and has been authorized for detection and/or diagnosis of SARS-CoV-2 by FDA under an Emergency Use Authorization (EUA). This EUA will remain in effect (meaning this test can be used) for the duration of the COVID-19 declaration under Section 564(b)(1) of the Act, 21 U.S.C. section 360bbb-3(b)(1), unless the authorization is  terminated or revoked.  Performed at [facility], [address], [place], [place] [address]   Culture, blood (Routine X 2) w Reflex to ID Panel     Status: None   Collection Time: 09/28/22  5:20 PM   Specimen: BLOOD  Result Value Ref Range Status   Specimen Description BLOOD BLOOD RIGHT HAND  Final   Special Requests   Final    BOTTLES DRAWN AEROBIC AND ANAEROBIC Blood Culture adequate volume   Culture   Final    NO GROWTH 5 DAYS Performed at [facility], [address], [place], [place] [address]    Report Status 10/03/2022 FINAL  Final  Culture, blood (Routine X 2) w Reflex to ID Panel     Status: None   Collection Time: 09/28/22  5:20 PM   Specimen: BLOOD  Result Value Ref Range Status   Specimen Description BLOOD BLOOD LEFT HAND  Final   Special Requests   Final    BOTTLES DRAWN AEROBIC AND ANAEROBIC Blood Culture adequate volume   Culture   Final    NO GROWTH 5 DAYS Performed at [facility], [address] [place], [place] [address]    Report Status 10/03/2022 FINAL  Final  MRSA Next Gen by PCR, Nasal     Status: None   Collection Time: 09/28/22  7:01 PM   Specimen: Nasal Mucosa; Nasal Swab  Result Value Ref Range Status   MRSA by PCR Next Gen NOT DETECTED NOT DETECTED Final    Comment: (NOTE) The GeneXpert MRSA Assay (FDA approved for NASAL specimens only), is one component of a comprehensive MRSA colonization surveillance program. It is not intended to diagnose MRSA infection nor to guide or monitor treatment for MRSA infections. Test performance is not FDA approved in patients less than 52 years old. Performed at [facility], [address], [place], [place] [address]   Salix prep     Status: None   Collection Time: 10/05/22 12:47 PM   Specimen: PATH GI Other  Result Value Ref Range Status   Specimen Description ESOPHAGUS  Final   Special Requests NONE  Final   KOH Prep   Final    NO YEAST OR FUNGAL ELEMENTS SEEN Performed at [facility], [address], [place], [place] [address]  Report Status 10/05/2022 FINAL  Final         Radiology Studies: No results found.      Scheduled Meds:  carvedilol  12.5 mg Oral BID   Chlorhexidine Gluconate Cloth  6 each Topical Daily   diltiazem  180 mg Oral Q12H   ipratropium  0.5 mg Nebulization TID   levalbuterol  0.63 mg Nebulization TID   levothyroxine  75 mcg Per Tube QAC breakfast   pantoprazole (PROTONIX) IV  40 mg Intravenous Q12H   [START ON 10/08/2022] polyethylene glycol  17 g Oral Daily   Continuous Infusions:  sodium chloride     lactated ringers       LOS: 9 days    Time spent: 35 minutes    Terrica Duecker Darleen Crocker, DO [facility]  If 7PM-7AM, please contact night-coverage [email or web address] 10/07/2022, 2:28 PM

## 2022-10-08 DIAGNOSIS — J189 Pneumonia, unspecified organism: Secondary | ICD-10-CM | POA: Diagnosis not present

## 2022-10-08 LAB — BASIC METABOLIC PANEL
Anion gap: 6 (ref 5–15)
BUN: 9 mg/dL (ref 8–23)
CO2: 23 mmol/L (ref 22–32)
Calcium: 8 mg/dL — ABNORMAL LOW (ref 8.9–10.3)
Chloride: 107 mmol/L (ref 98–111)
Creatinine, Ser: 1.31 mg/dL — ABNORMAL HIGH (ref 0.61–1.24)
GFR, Estimated: 52 mL/min — ABNORMAL LOW (ref >60)
Glucose, Bld: 114 mg/dL — ABNORMAL HIGH (ref 70–99)
Potassium: 3.8 mmol/L (ref 3.5–5.1)
Sodium: 136 mmol/L (ref 135–145)

## 2022-10-08 LAB — PROTIME-INR
INR: 1.3 — ABNORMAL HIGH (ref 0.8–1.2)
Prothrombin Time: 16.1 seconds — ABNORMAL HIGH (ref 11.4–15.2)

## 2022-10-08 LAB — GLUCOSE, CAPILLARY
Glucose-Capillary: 107 mg/dL — ABNORMAL HIGH (ref 70–99)
Glucose-Capillary: 108 mg/dL — ABNORMAL HIGH (ref 70–99)
Glucose-Capillary: 121 mg/dL — ABNORMAL HIGH (ref 70–99)

## 2022-10-08 LAB — MAGNESIUM: Magnesium: 1.5 mg/dL — ABNORMAL LOW (ref 1.7–2.4)

## 2022-10-08 MED ORDER — WARFARIN SODIUM 2 MG PO TABS: 3.0000 mg | ORAL_TABLET | Freq: Once | ORAL | Status: DC

## 2022-10-08 MED ORDER — MAGNESIUM SULFATE 2 GM/50ML IV SOLN
2.0000 g | Freq: Once | INTRAVENOUS | Status: AC
  Administered 2022-10-08: 2 g via INTRAVENOUS
  Filled 2022-10-08: qty 50

## 2022-10-08 MED ORDER — WARFARIN - PHARMACIST DOSING INPATIENT: Freq: Every day | Status: DC

## 2022-10-08 MED ORDER — WARFARIN SODIUM 2 MG PO TABS
2.0000 mg | ORAL_TABLET | Freq: Once | ORAL | Status: AC
  Administered 2022-10-08: 2 mg via ORAL
  Filled 2022-10-08: qty 1

## 2022-10-08 NOTE — Progress Notes (Signed)
PROGRESS NOTE    [name]  YIA:[id number] DOB: Oct 09, 1931 DOA: 09/28/2022 PCP: Manon Hilding, MD   Brief Narrative:    88 y.o. male with medical history significant for Atria Fibrillation, Crohn's, diastolic CHF, CAD admitted on 09/28/22 with multifocal pneumonia and A-fib with RVR.  His heart rate is better controlled and he has had some difficulty with sundowning, but this is now improved.  He is awaiting SNF placement at this point.   Assessment & Plan:   Principal Problem:   Multifocal pneumonia Active Problems:   Sepsis ([place])   Atrial fibrillation with RVR (HCC)   CAD (coronary artery disease)   Warfarin anticoagulation   Crohn's disease ([place])   Chronic diastolic heart failure (HCC)   Hematemesis   ABLA (acute blood loss anemia)   Adenomatous duodenal polyp  Assessment and Plan:  1)Multifocal Pneumonia--- suspect aspiration component in the setting of recurrent emesis Admission CTA chest-multifocal pneumonia/aspiration -Continue antibiotics; plan is to complete total of 10 days. -Patient has completed azithromycin  -Continue soft diet -Endoscopic evaluation by GI later today. -Continue bronchodilators and mucolytics -Leukocytosis has resolved   2)Atrial fibrillation with RVR (HCC)-resolved -Currently stable and well-controlled -Continue Coreg and diltiazem as ordered, okay to transfer to telemetry -Coumadin per pharmacy for stroke prophylaxis --INR subtherapeutic, reorder Coumadin   3)Acute blood loss anemia secondary to acute GI bleed/intractable emesis --- -Baseline Hgb usually around 13-14 --No further coffee-ground emesis with NG tube in situ -INR trending down appropriately after vitamin K -Abdominal x-rays on 10/02/2022 without obstructive findings -Continue antiemetics as needed -Hgb stable over the last 24 hours after initially dropping. -Successful removal of NG tube on 10/04/2022 -Status post endoscopy evaluation on 10/05/2022 demonstrating:  grade D esophagitis without acute bleeding.  There was erythematous changes in the gastric body and nodular mucosa in the second portion of the duodenum status post biopsies. -Follow recommendations by gastroenterology service continue PPI and advance diet.   4)Acute Hypoxic Respiratory Failure--- secondary to mostly #1 and #2 above -Patient required 2 L nasal cannula supplementation; has been able to be weaned off to room air currently; he denies shortness of breath currently. -Continue monitoring.   5)Crohn's disease ([place]) Stable.  On Humira PTA; planning to resume at discharge. -Will advise holding Humira given acute infection at this time   6)CAD (coronary artery disease) Follows with Dr. Domenic Polite.  Mild/moderate nonobstructive disease 2004.  No chest pain.  EKG unchanged. -Continue Coreg, no aspirin as patient is already on Coumadin -Echo as below #8   7)Hypothyroidism- -continue levothyroxine   8) Chronic diastolic heart failure (HCC) Stable and compensated.  Not on diuretics.   -Repeat echo on 10/01/2022 with EF of 50 to 55% which is similar to prior -Chest x-ray with small pleural effusion but no interstitial edema -Resewn diuretics when fully able to tolerate by mouth.   9) generalized weakness and deconditioning--- PT eval appreciated recommends SNF rehab   10) sundowning: -Avoid the use of benzodiazepines -As needed Haldol has been ordered -Continue constant reorientation and supportive care.  11) hypomagnesemia -Replete and reevaluate   DVT prophylaxis: Coumadin to be resumed today Code Status: Full Family Communication: Wife, Inez Catalina at bedside 12/23 Disposition Plan: SNF placement Status is: Inpatient Remains inpatient appropriate because: Altered mentation with plans for SNF placement.  IV medications.     Consultants:  GI   Procedures:  EGD 12/21   Antimicrobials:  Anti-infectives (From admission, onward)    Start     Dose/Rate Route  Frequency Ordered  Stop   10/03/22 1130  cefTRIAXone (ROCEPHIN) 1 g in sodium chloride 0.9 % 100 mL IVPB        1 g 200 mL/hr over 30 Minutes Intravenous Every 24 hours 10/03/22 1015 10/05/22 1210   09/29/22 1500  cefTRIAXone (ROCEPHIN) 2 g in sodium chloride 0.9 % 100 mL IVPB        2 g 200 mL/hr over 30 Minutes Intravenous Every 24 hours 09/28/22 1900 10/02/22 1630   09/29/22 1500  azithromycin (ZITHROMAX) 500 mg in sodium chloride 0.9 % 250 mL IVPB  Status:  Discontinued        500 mg 250 mL/hr over 60 Minutes Intravenous Every 24 hours 09/28/22 1900 09/29/22 0849   09/29/22 1500  azithromycin (ZITHROMAX) 500 mg in sodium chloride 0.9 % 250 mL IVPB        500 mg 250 mL/hr over 60 Minutes Intravenous Every 24 hours 09/29/22 0849 10/02/22 1739   09/28/22 1515  cefTRIAXone (ROCEPHIN) 2 g in sodium chloride 0.9 % 100 mL IVPB        2 g 200 mL/hr over 30 Minutes Intravenous  Once 09/28/22 1500 09/28/22 1640   09/28/22 1515  azithromycin (ZITHROMAX) 500 mg in sodium chloride 0.9 % 250 mL IVPB        500 mg 250 mL/hr over 60 Minutes Intravenous  Once 09/28/22 1500 09/28/22 1837      Subjective: Patient seen and evaluated today with no new acute complaints or concerns. No acute concerns or events noted overnight.  Wife is at bedside and he is eating and drinking with no issues and has normal bowel movements.  He is having less hallucinations at night.  Objective: Vitals:   10/08/22 0600 10/08/22 0700 10/08/22 0800 10/08/22 0900  BP: 114/63  139/74   Pulse: 81 (!) 55 73 76  Resp: 20 19 (!) 21 20  Temp:      TempSrc:      SpO2: 96% 97% 96% 99%  Weight:      Height:        Intake/Output Summary (Last 24 hours) at 10/08/2022 1155 Last data filed at 10/08/2022 0415 Gross per 24 hour  Intake --  Output 801 ml  Net -801 ml   Filed Weights   10/05/22 1210 10/07/22 0500 10/08/22 0414  Weight: 71 kg 67.1 kg 68.6 kg    Examination:  General exam: Appears calm and comfortable  Respiratory system:  Clear to auscultation. Respiratory effort normal. Cardiovascular system: S1 & S2 heard, irregular. Gastrointestinal system: Abdomen is soft Central nervous system: Alert and awake Extremities: No edema Skin: No significant lesions noted Psychiatry: Flat affect.    Data Reviewed: I have personally reviewed following labs and imaging studies  CBC: Recent Labs  Lab 10/02/22 1559 10/03/22 0311 10/04/22 0307 10/05/22 0531 10/06/22 1004  WBC 8.8 7.6 7.1 9.0  --   NEUTROABS 5.4  --  3.0 4.5  --   HGB 9.4* 9.0* 9.1* 9.4* 10.6*  HCT 27.0* 26.1* 27.0* 28.4* 30.7*  MCV 90.3 91.9 93.8 95.0  --   PLT 209 232 271 279  --    Basic Metabolic Panel: Recent Labs  Lab 10/03/22 0311 10/04/22 0307 10/06/22 0306 10/07/22 0315 10/08/22 0554  NA 139 140 135 139 136  K 3.0* 3.8 3.2* 3.7 3.8  CL 109 111 105 110 107  CO2 24 24 24 24 23   GLUCOSE 97 96 95 96 114*  BUN 50* 27* 10 8 9  CREATININE 1.48* 1.19 1.10 1.09 1.31*  CALCIUM 7.8* 7.8* 7.9* 8.0* 8.0*  MG  --   --  1.5*  --  1.5*  PHOS  --  1.8*  --   --   --    GFR: Estimated Creatinine Clearance: 37 mL/min (A) (by C-G formula based on SCr of 1.31 mg/dL (H)). Liver Function Tests: Recent Labs  Lab 10/03/22 0311 10/04/22 0307  AST 17  --   ALT 17  --   ALKPHOS 53  --   BILITOT 0.6  --   PROT 4.8*  --   ALBUMIN 2.3* 2.2*   No results for input(s): "LIPASE", "AMYLASE" in the last 168 hours. No results for input(s): "AMMONIA" in the last 168 hours. Coagulation Profile: Recent Labs  Lab 10/04/22 0307 10/05/22 0333 10/06/22 0306 10/07/22 0315 10/08/22 0554  INR 1.4* 1.4* 1.4* 1.2 1.3*   Cardiac Enzymes: No results for input(s): "CKTOTAL", "CKMB", "CKMBINDEX", "TROPONINI" in the last 168 hours. BNP (last 3 results) No results for input(s): "PROBNP" in the last 8760 hours. HbA1C: No results for input(s): "HGBA1C" in the last 72 hours. CBG: Recent Labs  Lab 10/06/22 1525 10/06/22 2116 10/07/22 0118 10/07/22 0509  10/08/22 1133  GLUCAP 87 106* 99 87 107*   Lipid Profile: No results for input(s): "CHOL", "HDL", "LDLCALC", "TRIG", "CHOLHDL", "LDLDIRECT" in the last 72 hours. Thyroid Function Tests: No results for input(s): "TSH", "T4TOTAL", "FREET4", "T3FREE", "THYROIDAB" in the last 72 hours. Anemia Panel: No results for input(s): "VITAMINB12", "FOLATE", "FERRITIN", "TIBC", "IRON", "RETICCTPCT" in the last 72 hours. Sepsis Labs: No results for input(s): "PROCALCITON", "LATICACIDVEN" in the last 168 hours.  Recent Results (from the past 240 hour(s))  Culture, blood (Routine X 2) w Reflex to ID Panel     Status: None   Collection Time: 09/28/22  5:20 PM   Specimen: BLOOD  Result Value Ref Range Status   Specimen Description BLOOD BLOOD RIGHT HAND  Final   Special Requests   Final    BOTTLES DRAWN AEROBIC AND ANAEROBIC Blood Culture adequate volume   Culture   Final    NO GROWTH 5 DAYS Performed at [facility], [address] [place], [place] [address]    Report Status 10/03/2022 FINAL  Final  Culture, blood (Routine X 2) w Reflex to ID Panel     Status: None   Collection Time: 09/28/22  5:20 PM   Specimen: BLOOD  Result Value Ref Range Status   Specimen Description BLOOD BLOOD LEFT HAND  Final   Special Requests   Final    BOTTLES DRAWN AEROBIC AND ANAEROBIC Blood Culture adequate volume   Culture   Final    NO GROWTH 5 DAYS Performed at [facility], [address] [place], [place] [address]    Report Status 10/03/2022 FINAL  Final  MRSA Next Gen by PCR, Nasal     Status: None   Collection Time: 09/28/22  7:01 PM   Specimen: Nasal Mucosa; Nasal Swab  Result Value Ref Range Status   MRSA by PCR Next Gen NOT DETECTED NOT DETECTED Final    Comment: (NOTE) The GeneXpert MRSA Assay (FDA approved for NASAL specimens only), is one component of a comprehensive MRSA colonization surveillance program. It is not intended to diagnose MRSA infection nor to guide or monitor treatment for MRSA  infections. Test performance is not FDA approved in patients less than 21 years old. Performed at [facility], [address] [place], [place] [address]   Concrete  prep     Status: None   Collection Time: 10/05/22 12:47 PM   Specimen: PATH GI Other  Result Value Ref Range Status   Specimen Description ESOPHAGUS  Final   Special Requests NONE  Final   KOH Prep   Final    NO YEAST OR FUNGAL ELEMENTS SEEN Performed at [facility], [address] [place], [place] [address]    Report Status 10/05/2022 FINAL  Final         Radiology Studies: No results found.      Scheduled Meds:  carvedilol  12.5 mg Oral BID   Chlorhexidine Gluconate Cloth  6 each Topical Daily   diltiazem  180 mg Oral Q12H   levothyroxine  75 mcg Per Tube QAC breakfast   pantoprazole  40 mg Oral BID   polyethylene glycol  17 g Oral Daily     LOS: 10 days    Time spent: 35 minutes    Emilia Kayes Darleen Crocker, DO [facility]  If 7PM-7AM, please contact night-coverage [email or web address] 10/08/2022, 11:55 AM

## 2022-10-08 NOTE — Progress Notes (Signed)
Report given to [employer] reflecting the patient's current status, patient will be transferring to room 322

## 2022-10-08 NOTE — Consult Note (Addendum)
ANTICOAGULATION CONSULT NOTE - Initial Consult  Pharmacy Consult for warfarin Indication: atrial fibrillation  No Known Allergies  Patient Measurements: Height: 5' 8"  (172.7 cm) Weight: 68.6 kg (151 lb 3.8 oz) IBW/kg (Calculated) : 68.4  Vital Signs: Temp: 98.9 F (37.2 C) (12/24 0414) Temp Source: Oral (12/24 0414) BP: 139/74 (12/24 0800) Pulse Rate: 76 (12/24 0900)  Labs: Recent Labs    10/06/22 0306 10/06/22 1004 10/07/22 0315 10/08/22 0554  HGB  --  10.6*  --   --   HCT  --  30.7*  --   --   LABPROT 17.0*  --  15.4* 16.1*  INR 1.4*  --  1.2 1.3*  CREATININE 1.10  --  1.09 1.31*    Estimated Creatinine Clearance: 37 mL/min (A) (by C-G formula based on SCr of 1.31 mg/dL (H)).   Medical History: Past Medical History:  Diagnosis Date   Anemia    Atrial fibrillation (HCC)    BPH (benign prostatic hyperplasia)    CAD (coronary artery disease)    Catheterization 2004, mild/moderate nonobstructive disease  /   nuclear, 2007, small inferior scar // no ischemia   Crohn's disease ([place])    Elevated PSA    GERD (gastroesophageal reflux disease)    History of kidney stones    History of pneumonia    Hypothyroidism    Mitral regurgitation    SBO (small bowel obstruction) ([place])    Urinary retention    Wears glasses     Medications:  Medications Prior to Admission  Medication Sig Dispense Refill Last Dose   acetaminophen (TYLENOL) 500 MG tablet Take 500 mg by mouth every 6 (six) hours as needed for mild pain or moderate pain.   unk   carvedilol (COREG) 6.25 MG tablet TAKE ONE TABLET BY MOUTH TWICE DAILY 60 tablet 6 09/27/2022   Cholecalciferol (VITAMIN D3) 50 MCG (2000 UT) TABS Take 1 tablet by mouth daily.   09/27/2022   cyclobenzaprine (FLEXERIL) 10 MG tablet Take 10 mg by mouth at bedtime.   09/27/2022   dextromethorphan-guaiFENesin (MUCINEX DM) 30-600 MG 12hr tablet Take 1 tablet by mouth 2 (two) times daily.   09/27/2022   diltiazem (CARDIZEM CD) 180 MG 24 hr  capsule Take 180 mg by mouth 2 (two) times daily.   09/27/2022   levothyroxine (SYNTHROID, LEVOTHROID) 75 MCG tablet Take 75 mcg by mouth daily before breakfast.   09/27/2022   magnesium gluconate (MAGONATE) 500 MG tablet Take 500 mg by mouth 2 (two) times daily.   09/27/2022   Melatonin 12 MG TABS Take 15 mg by mouth at bedtime.   09/27/2022   nitroGLYCERIN (NITROSTAT) 0.4 MG SL tablet Place 0.4 mg under the tongue every 5 (five) minutes as needed. For chest pains. May repeat for up to 3 doses.   unk   omeprazole (PRILOSEC) 40 MG capsule Take 1 capsule (40 mg total) by mouth daily. 90 capsule 3 09/27/2022   Probiotic Product (ALIGN) 4 MG CAPS Take by mouth in the morning.   09/27/2022   traZODone (DESYREL) 50 MG tablet Take 50 mg by mouth at bedtime.   09/27/2022   warfarin (COUMADIN) 2.5 MG tablet TAKE 1-1 &1/2 TABLETS BY MOUTH DAILY AS DIRECTED 45 tablet 6 09/27/2022 at 0900   Adalimumab (HUMIRA) 40 MG/0.4ML PSKT Inject 40 mg into the skin every 14 (fourteen) days.       Assessment: Pt is on chronic warfarin therapy for Afib.  Warfarin has been on hold secondary to GI  bleed.  MD ordered to resume therapy today.   Goal of Therapy:  INR 2-3 Monitor platelets by anticoagulation protocol: Yes   Plan:  Will resume warfarin@2mg  today , below home regimen to evaluate patient's response post GI bleed.  Further dosing based on pt response.  Berta Minor 10/08/2022,12:37 PM

## 2022-10-08 NOTE — Progress Notes (Signed)
Neb tx have been reduced to as needed.

## 2022-10-08 NOTE — Progress Notes (Signed)
Speech Language Pathology Treatment: Dysphagia  Patient Details Name: [name] MRN: [id number] DOB: [birth year] Today's Date: 10/08/2022 Time: 5997-7414 SLP Time Calculation (min) (ACUTE ONLY): 22 min  Assessment / Plan / Recommendation Clinical Impression  Pt seen for ongoing dysphagia intervention. He consumed ~50% of his lunch tray (soft and thin), but wife reports that the meat was difficult for him to masticate ([place]). Pt consumed chopped [place] mixed in with mashed potato without incident. He self presented straw sips thin tea, which elicited one delayed cough. His wife reports that she makes sure that he does not use straws, as he occasionally coughs with them prior to admission. Pt self presented cup sips tea without incident. Wife also reports that Pt enjoyed bacon on his tray the other day- will keep diet as ordered to not limit his choices. He consumed a container of ice cream with SLP and was encouraged to ask for snacks throughout the day if hungry. No further SLP services indicated at this time. SLP will sign off.     HPI HPI: [name] is a 88 y.o. male with medical history significant for Atria Fibrillation, Crohn's, diastolic CHF, CAD.  Patient presented to the ED with complaints of worsening cough over the past 2 weeks, this morning he had at least 5 episodes of vomiting was lightheaded and came to the ED.  Reports earlier into his symptoms, he had a chest x-ray done by his outpatient provider and it did not show a chest x-ray, he was prescribed Mucinex.  He denies difficulty breathing;12/18 CXR indicated Small bilateral pleural effusions, right greater than left, mildly  progressive.     Enteric tube coursing into the mid stomach; BSE generated 12/14, but pt unable to complete until this date d/t pain/decreased alertness and vomiting.      SLP Plan  Continue with current plan of care      Recommendations for follow up therapy are one component of a multi-disciplinary  discharge planning process, led by the attending physician.  Recommendations may be updated based on patient status, additional functional criteria and insurance authorization.    Recommendations  Diet recommendations: Regular;Dysphagia 3 (mechanical soft);Thin liquid Liquids provided via: Cup;No straw Medication Administration: Whole meds with liquid Supervision: Patient able to self feed;Staff to assist with self feeding;Full supervision/cueing for compensatory strategies Compensations: Slow rate;Small sips/bites;Multiple dry swallows after each bite/sip Postural Changes and/or Swallow Maneuvers: Seated upright 90 degrees;Upright 30-60 min after meal                Oral Care Recommendations: Oral care BID;Staff/trained caregiver to provide oral care Follow Up Recommendations: Follow physician's recommendations for discharge plan and follow up therapies Assistance recommended at discharge: Set up Supervision/Assistance SLP Visit Diagnosis: Dysphagia, unspecified (R13.10) Plan: Continue with current plan of care         Thank you,  Genene Churn, [place]  10/08/2022, 12:15 PM

## 2022-10-08 NOTE — TOC Progression Note (Signed)
Transition of Care [facility]) - Progression Note    Patient Details  Name: [name] MRN: [id number] Date of Birth: 28-Nov-1930  [facility]) CM/SW Contact  Shade Flood, LCSW Phone Number: 10/08/2022, 3:09 PM  Clinical Narrative:     SNF auth request started with HTA today in anticipation of dc on Tuesday 12/26. TOC will follow.  Expected Discharge Plan: [place] Barriers to Discharge: Continued Medical Work up  Expected Discharge Plan and Services       Living arrangements for the past 2 months: Single Family Home                                       Social Determinants of Health (SDOH) Interventions SDOH Screenings   Food Insecurity: No Food Insecurity (10/02/2022)  Housing: Low Risk  (10/02/2022)  Transportation Needs: No Transportation Needs (10/02/2022)  Utilities: Not At Risk (10/02/2022)  Tobacco Use: Low Risk  (10/05/2022)    Readmission Risk Interventions     No data to display

## 2022-10-09 DIAGNOSIS — J189 Pneumonia, unspecified organism: Secondary | ICD-10-CM | POA: Diagnosis not present

## 2022-10-09 LAB — CBC
HCT: 25.8 % — ABNORMAL LOW (ref 39.0–52.0)
Hemoglobin: 8.7 g/dL — ABNORMAL LOW (ref 13.0–17.0)
MCH: 31.6 pg (ref 26.0–34.0)
MCHC: 33.7 g/dL (ref 30.0–36.0)
MCV: 93.8 fL (ref 80.0–100.0)
Platelets: 328 10*3/uL (ref 150–400)
RBC: 2.75 MIL/uL — ABNORMAL LOW (ref 4.22–5.81)
RDW: 15.3 % (ref 11.5–15.5)
WBC: 12.2 10*3/uL — ABNORMAL HIGH (ref 4.0–10.5)
nRBC: 0 % (ref 0.0–0.2)

## 2022-10-09 LAB — GLUCOSE, CAPILLARY
Glucose-Capillary: 113 mg/dL — ABNORMAL HIGH (ref 70–99)
Glucose-Capillary: 108 mg/dL — ABNORMAL HIGH (ref 70–99)
Glucose-Capillary: 103 mg/dL — ABNORMAL HIGH (ref 70–99)
Glucose-Capillary: 108 mg/dL — ABNORMAL HIGH (ref 70–99)

## 2022-10-09 LAB — BASIC METABOLIC PANEL
Anion gap: 7 (ref 5–15)
BUN: 11 mg/dL (ref 8–23)
CO2: 23 mmol/L (ref 22–32)
Calcium: 8.1 mg/dL — ABNORMAL LOW (ref 8.9–10.3)
Chloride: 106 mmol/L (ref 98–111)
Creatinine, Ser: 1.35 mg/dL — ABNORMAL HIGH (ref 0.61–1.24)
GFR, Estimated: 50 mL/min — ABNORMAL LOW (ref >60)
Glucose, Bld: 106 mg/dL — ABNORMAL HIGH (ref 70–99)
Potassium: 3.9 mmol/L (ref 3.5–5.1)
Sodium: 136 mmol/L (ref 135–145)

## 2022-10-09 LAB — PROTIME-INR
INR: 1.2 (ref 0.8–1.2)
Prothrombin Time: 15.3 seconds — ABNORMAL HIGH (ref 11.4–15.2)

## 2022-10-09 LAB — MAGNESIUM: Magnesium: 1.8 mg/dL (ref 1.7–2.4)

## 2022-10-09 MED ORDER — WARFARIN SODIUM 2.5 MG PO TABS
2.5000 mg | ORAL_TABLET | Freq: Once | ORAL | Status: AC
  Administered 2022-10-09: 2.5 mg via ORAL
  Filled 2022-10-09: qty 1

## 2022-10-09 MED ORDER — MELATONIN 3 MG PO TABS
6.0000 mg | ORAL_TABLET | Freq: Every day | ORAL | Status: DC
  Administered 2022-10-09 (×2): 6 mg via ORAL
  Filled 2022-10-09 (×2): qty 2

## 2022-10-09 MED ORDER — LACTATED RINGERS IV SOLN: INTRAVENOUS | Status: AC

## 2022-10-09 NOTE — Consult Note (Signed)
ANTICOAGULATION CONSULT NOTE -   Pharmacy Consult for warfarin Indication: atrial fibrillation  No Known Allergies  Patient Measurements: Height: 5' 8"  (172.7 cm) Weight: 68.6 kg (151 lb 3.8 oz) IBW/kg (Calculated) : 68.4  Vital Signs: Temp: 98.3 F (36.8 C) (12/25 0431) Temp Source: Oral (12/24 2138) BP: 105/66 (12/25 0431) Pulse Rate: 87 (12/25 0431)  Labs: Recent Labs    10/06/22 1004 10/07/22 0315 10/08/22 0554 10/09/22 0435  HGB 10.6*  --   --  8.7*  HCT 30.7*  --   --  25.8*  PLT  --   --   --  328  LABPROT  --  15.4* 16.1* 15.3*  INR  --  1.2 1.3* 1.2  CREATININE  --  1.09 1.31* 1.35*     Estimated Creatinine Clearance: 35.9 mL/min (A) (by C-G formula based on SCr of 1.35 mg/dL (H)).   Medical History: Past Medical History:  Diagnosis Date   Anemia    Atrial fibrillation (HCC)    BPH (benign prostatic hyperplasia)    CAD (coronary artery disease)    Catheterization 2004, mild/moderate nonobstructive disease  /   nuclear, 2007, small inferior scar // no ischemia   Crohn's disease ([place])    Elevated PSA    GERD (gastroesophageal reflux disease)    History of kidney stones    History of pneumonia    Hypothyroidism    Mitral regurgitation    SBO (small bowel obstruction) ([place])    Urinary retention    Wears glasses     Medications:  Medications Prior to Admission  Medication Sig Dispense Refill Last Dose   acetaminophen (TYLENOL) 500 MG tablet Take 500 mg by mouth every 6 (six) hours as needed for mild pain or moderate pain.   unk   carvedilol (COREG) 6.25 MG tablet TAKE ONE TABLET BY MOUTH TWICE DAILY 60 tablet 6 09/27/2022   Cholecalciferol (VITAMIN D3) 50 MCG (2000 UT) TABS Take 1 tablet by mouth daily.   09/27/2022   cyclobenzaprine (FLEXERIL) 10 MG tablet Take 10 mg by mouth at bedtime.   09/27/2022   dextromethorphan-guaiFENesin (MUCINEX DM) 30-600 MG 12hr tablet Take 1 tablet by mouth 2 (two) times daily.   09/27/2022   diltiazem (CARDIZEM CD)  180 MG 24 hr capsule Take 180 mg by mouth 2 (two) times daily.   09/27/2022   levothyroxine (SYNTHROID, LEVOTHROID) 75 MCG tablet Take 75 mcg by mouth daily before breakfast.   09/27/2022   magnesium gluconate (MAGONATE) 500 MG tablet Take 500 mg by mouth 2 (two) times daily.   09/27/2022   Melatonin 12 MG TABS Take 15 mg by mouth at bedtime.   09/27/2022   nitroGLYCERIN (NITROSTAT) 0.4 MG SL tablet Place 0.4 mg under the tongue every 5 (five) minutes as needed. For chest pains. May repeat for up to 3 doses.   unk   omeprazole (PRILOSEC) 40 MG capsule Take 1 capsule (40 mg total) by mouth daily. 90 capsule 3 09/27/2022   Probiotic Product (ALIGN) 4 MG CAPS Take by mouth in the morning.   09/27/2022   traZODone (DESYREL) 50 MG tablet Take 50 mg by mouth at bedtime.   09/27/2022   warfarin (COUMADIN) 2.5 MG tablet TAKE 1-1 &1/2 TABLETS BY MOUTH DAILY AS DIRECTED 45 tablet 6 09/27/2022 at 0900   Adalimumab (HUMIRA) 40 MG/0.4ML PSKT Inject 40 mg into the skin every 14 (fourteen) days.       Assessment: Pt is on chronic warfarin therapy for Afib.  Warfarin has been on hold secondary to GI bleed.  MD ordered to resume therapy.  Home dose listed as 1.25 mg on Wed and 2.5 mg ROW.  INR 1.2   Goal of Therapy:  INR 2-3 Monitor platelets by anticoagulation protocol: Yes   Plan:  Warfarin 2.5 mg x 1 dose Monitor daily INR and s/s of bleeding  Margot Ables, PharmD Clinical Pharmacist 10/09/2022 7:57 AM

## 2022-10-09 NOTE — Progress Notes (Signed)
Patient has rested most of this shift. Patient did request a prn medication. Patient spouse is at bedside.

## 2022-10-09 NOTE — TOC Progression Note (Signed)
Transition of Care [facility]) - Progression Note    Patient Details  Name: [name] MRN: [id number] Date of Birth: 08/09/1931  [facility]) CM/SW Contact  Shade Flood, LCSW Phone Number: 10/09/2022, 10:24 AM  Clinical Narrative:     TOC following. [facility]TA approved SNF. Auth number is [id number]. Pt approved for 7 rehab days and he has 5 business days to admit to SNF from [facility].   Awaiting SNF bed offer. Will update insurance once SNF choice confirmed. Anticipating dc Tuesday.  Expected Discharge Plan: [place] Barriers to Discharge: Continued Medical Work up  Expected Discharge Plan and Services       Living arrangements for the past 2 months: Single Family Home                                       Social Determinants of Health (SDOH) Interventions SDOH Screenings   Food Insecurity: No Food Insecurity (10/02/2022)  Housing: Low Risk  (10/02/2022)  Transportation Needs: No Transportation Needs (10/02/2022)  Utilities: Not At Risk (10/02/2022)  Tobacco Use: Low Risk  (10/05/2022)    Readmission Risk Interventions     No data to display

## 2022-10-09 NOTE — Progress Notes (Signed)
PROGRESS NOTE    [name]  VZC:[id number] DOB: November 02, 1930 DOA: 09/28/2022 PCP: Manon Hilding, MD   Brief Narrative:  88 y.o. male with medical history significant for Atria Fibrillation, Crohn's, diastolic CHF, CAD admitted on 09/28/22 with multifocal pneumonia and A-fib with RVR.  His heart rate is better controlled and he has had some difficulty with sundowning, but this is now improved.  He is awaiting SNF placement at this point; hopefully can be discharged in the next 24-48 hours pending placement.  Assessment & Plan:   Principal Problem:   Multifocal pneumonia Active Problems:   Sepsis ([place])   Atrial fibrillation with RVR (HCC)   CAD (coronary artery disease)   Warfarin anticoagulation   Crohn's disease ([place])   Chronic diastolic heart failure (HCC)   Hematemesis   ABLA (acute blood loss anemia)   Adenomatous duodenal polyp  Assessment and Plan:  1)Multifocal Pneumonia--- suspect aspiration component in the setting of recurrent emesis Admission CTA chest-multifocal pneumonia/aspiration -Continue antibiotics; plan is to complete total of 10 days.  Antibiotics now completed -Patient has completed azithromycin  -Continue soft diet -Endoscopic evaluation by GI later today. -Continue bronchodilators and mucolytics -Leukocytosis persists   2)Atrial fibrillation with RVR (HCC)-resolved -Currently stable and well-controlled -Continue Coreg and diltiazem as ordered, okay to transfer to telemetry -Coumadin per pharmacy for stroke prophylaxis --INR subtherapeutic, reorder Coumadin   3)Acute blood loss anemia secondary to acute GI bleed/intractable emesis --- -Baseline Hgb usually around 13-14 --No further coffee-ground emesis with NG tube in situ -INR trending down appropriately after vitamin K -Abdominal x-rays on 10/02/2022 without obstructive findings -Continue antiemetics as needed -Hgb stable over the last 24 hours after initially dropping. -Successful removal  of NG tube on 10/04/2022 -Status post endoscopy evaluation on 10/05/2022 demonstrating: grade D esophagitis without acute bleeding.  There was erythematous changes in the gastric body and nodular mucosa in the second portion of the duodenum status post biopsies. -Follow recommendations by gastroenterology service continue PPI and advance diet.   4)Acute Hypoxic Respiratory Failure--- secondary to mostly #1 and #2 above -Patient required 2 L nasal cannula supplementation; has been able to be weaned off to room air currently; he denies shortness of breath currently. -Continue monitoring.   5)Crohn's disease ([place]) Stable.  On Humira PTA; planning to resume at discharge. -Will advise holding Humira given acute infection at this time   6)CAD (coronary artery disease) Follows with Dr. Domenic Polite.  Mild/moderate nonobstructive disease 2004.  No chest pain.  EKG unchanged. -Continue Coreg, no aspirin as patient is already on Coumadin -Echo as below #8   7)Hypothyroidism- -continue levothyroxine   8) Chronic diastolic heart failure (HCC) Stable and compensated.  Not on diuretics.   -Repeat echo on 10/01/2022 with EF of 50 to 55% which is similar to prior -Chest x-ray with small pleural effusion but no interstitial edema -Start gentle IV fluid 12/25 given mild creatinine bump and monitor a.m. labs   9) generalized weakness and deconditioning--- PT eval appreciated recommends SNF rehab   10) sundowning-improved -Avoid the use of benzodiazepines -As needed Haldol has been ordered -Continue constant reorientation and supportive care.     DVT prophylaxis: Coumadin Code Status: Full Family Communication: Wife, Inez Catalina at bedside 12/25 Disposition Plan: SNF placement Status is: Inpatient Remains inpatient appropriate because: Altered mentation with plans for SNF placement.  IV medications.     Consultants:  GI   Procedures:  EGD 12/21   Antimicrobials:  Anti-infectives (From admission,  onward)  Start     Dose/Rate Route Frequency Ordered Stop   10/03/22 1130  cefTRIAXone (ROCEPHIN) 1 g in sodium chloride 0.9 % 100 mL IVPB        1 g 200 mL/hr over 30 Minutes Intravenous Every 24 hours 10/03/22 1015 10/05/22 1210   09/29/22 1500  cefTRIAXone (ROCEPHIN) 2 g in sodium chloride 0.9 % 100 mL IVPB        2 g 200 mL/hr over 30 Minutes Intravenous Every 24 hours 09/28/22 1900 10/02/22 1630   09/29/22 1500  azithromycin (ZITHROMAX) 500 mg in sodium chloride 0.9 % 250 mL IVPB  Status:  Discontinued        500 mg 250 mL/hr over 60 Minutes Intravenous Every 24 hours 09/28/22 1900 09/29/22 0849   09/29/22 1500  azithromycin (ZITHROMAX) 500 mg in sodium chloride 0.9 % 250 mL IVPB        500 mg 250 mL/hr over 60 Minutes Intravenous Every 24 hours 09/29/22 0849 10/02/22 1739   09/28/22 1515  cefTRIAXone (ROCEPHIN) 2 g in sodium chloride 0.9 % 100 mL IVPB        2 g 200 mL/hr over 30 Minutes Intravenous  Once 09/28/22 1500 09/28/22 1640   09/28/22 1515  azithromycin (ZITHROMAX) 500 mg in sodium chloride 0.9 % 250 mL IVPB        500 mg 250 mL/hr over 60 Minutes Intravenous  Once 09/28/22 1500 09/28/22 1837       Subjective: Patient seen and evaluated today with no new acute complaints or concerns. No acute concerns or events noted overnight.  Objective: Vitals:   10/08/22 0900 10/08/22 1250 10/08/22 2138 10/09/22 0431  BP:  (!) 140/95 135/89 105/66  Pulse: 76 88 (!) 40 87  Resp: 20 20 18 18   Temp:  98.4 F (36.9 C) 98.7 F (37.1 C) 98.3 F (36.8 C)  TempSrc:  Oral Oral   SpO2: 99% 98% 95% 94%  Weight:      Height:        Intake/Output Summary (Last 24 hours) at 10/09/2022 1008 Last data filed at 10/09/2022 0500 Gross per 24 hour  Intake 50 ml  Output 850 ml  Net -800 ml   Filed Weights   10/05/22 1210 10/07/22 0500 10/08/22 0414  Weight: 71 kg 67.1 kg 68.6 kg    Examination:  General exam: Appears calm and comfortable  Respiratory system: Clear to  auscultation. Respiratory effort normal. Cardiovascular system: S1 & S2 heard, RRR.  Gastrointestinal system: Abdomen is soft Central nervous system: Alert and awake Extremities: No edema Skin: No significant lesions noted Psychiatry: Flat affect.    Data Reviewed: I have personally reviewed following labs and imaging studies  CBC: Recent Labs  Lab 10/02/22 1559 10/03/22 0311 10/04/22 0307 10/05/22 0531 10/06/22 1004 10/09/22 0435  WBC 8.8 7.6 7.1 9.0  --  12.2*  NEUTROABS 5.4  --  3.0 4.5  --   --   HGB 9.4* 9.0* 9.1* 9.4* 10.6* 8.7*  HCT 27.0* 26.1* 27.0* 28.4* 30.7* 25.8*  MCV 90.3 91.9 93.8 95.0  --  93.8  PLT 209 232 271 279  --  357   Basic Metabolic Panel: Recent Labs  Lab 10/04/22 0307 10/06/22 0306 10/07/22 0315 10/08/22 0554 10/09/22 0435  NA 140 135 139 136 136  K 3.8 3.2* 3.7 3.8 3.9  CL 111 105 110 107 106  CO2 24 24 24 23 23   GLUCOSE 96 95 96 114* 106*  BUN 27* 10 8 9  11  CREATININE 1.19 1.10 1.09 1.31* 1.35*  CALCIUM 7.8* 7.9* 8.0* 8.0* 8.1*  MG  --  1.5*  --  1.5* 1.8  PHOS 1.8*  --   --   --   --    GFR: Estimated Creatinine Clearance: 35.9 mL/min (A) (by C-G formula based on SCr of 1.35 mg/dL (H)). Liver Function Tests: Recent Labs  Lab 10/03/22 0311 10/04/22 0307  AST 17  --   ALT 17  --   ALKPHOS 53  --   BILITOT 0.6  --   PROT 4.8*  --   ALBUMIN 2.3* 2.2*   No results for input(s): "LIPASE", "AMYLASE" in the last 168 hours. No results for input(s): "AMMONIA" in the last 168 hours. Coagulation Profile: Recent Labs  Lab 10/05/22 0333 10/06/22 0306 10/07/22 0315 10/08/22 0554 10/09/22 0435  INR 1.4* 1.4* 1.2 1.3* 1.2   Cardiac Enzymes: No results for input(s): "CKTOTAL", "CKMB", "CKMBINDEX", "TROPONINI" in the last 168 hours. BNP (last 3 results) No results for input(s): "PROBNP" in the last 8760 hours. HbA1C: No results for input(s): "HGBA1C" in the last 72 hours. CBG: Recent Labs  Lab 10/07/22 0509 10/08/22 1133  10/08/22 1622 10/08/22 2149 10/09/22 0738  GLUCAP 87 107* 108* 121* 113*   Lipid Profile: No results for input(s): "CHOL", "HDL", "LDLCALC", "TRIG", "CHOLHDL", "LDLDIRECT" in the last 72 hours. Thyroid Function Tests: No results for input(s): "TSH", "T4TOTAL", "FREET4", "T3FREE", "THYROIDAB" in the last 72 hours. Anemia Panel: No results for input(s): "VITAMINB12", "FOLATE", "FERRITIN", "TIBC", "IRON", "RETICCTPCT" in the last 72 hours. Sepsis Labs: No results for input(s): "PROCALCITON", "LATICACIDVEN" in the last 168 hours.  Recent Results (from the past 240 hour(s))  KOH prep     Status: None   Collection Time: 10/05/22 12:47 PM   Specimen: PATH GI Other  Result Value Ref Range Status   Specimen Description ESOPHAGUS  Final   Special Requests NONE  Final   KOH Prep   Final    NO YEAST OR FUNGAL ELEMENTS SEEN Performed at [facility], [address], [place], [place] [address]    Report Status 10/05/2022 FINAL  Final         Radiology Studies: No results found.      Scheduled Meds:  carvedilol  12.5 mg Oral BID   diltiazem  180 mg Oral Q12H   levothyroxine  75 mcg Per Tube QAC breakfast   melatonin  6 mg Oral QHS   pantoprazole  40 mg Oral BID   polyethylene glycol  17 g Oral Daily   warfarin  2.5 mg Oral ONCE-1600   Warfarin - Pharmacist Dosing Inpatient   Does not apply q1600   Continuous Infusions:  lactated ringers       LOS: 11 days    Time spent: 35 minutes    Jakell Trusty Darleen Crocker, DO [facility]  If 7PM-7AM, please contact night-coverage [email or web address] 10/09/2022, 10:08 AM

## 2022-10-10 DIAGNOSIS — M6281 Muscle weakness (generalized): Secondary | ICD-10-CM | POA: Diagnosis not present

## 2022-10-10 DIAGNOSIS — K2081 Other esophagitis with bleeding: Secondary | ICD-10-CM | POA: Diagnosis not present

## 2022-10-10 DIAGNOSIS — D62 Acute posthemorrhagic anemia: Secondary | ICD-10-CM | POA: Diagnosis not present

## 2022-10-10 DIAGNOSIS — K920 Hematemesis: Secondary | ICD-10-CM | POA: Diagnosis not present

## 2022-10-10 DIAGNOSIS — I5032 Chronic diastolic (congestive) heart failure: Secondary | ICD-10-CM | POA: Diagnosis not present

## 2022-10-10 DIAGNOSIS — K5090 Crohn's disease, unspecified, without complications: Secondary | ICD-10-CM | POA: Diagnosis not present

## 2022-10-10 DIAGNOSIS — R41841 Cognitive communication deficit: Secondary | ICD-10-CM | POA: Diagnosis not present

## 2022-10-10 DIAGNOSIS — R2689 Other abnormalities of gait and mobility: Secondary | ICD-10-CM | POA: Diagnosis not present

## 2022-10-10 DIAGNOSIS — Z7901 Long term (current) use of anticoagulants: Secondary | ICD-10-CM | POA: Diagnosis not present

## 2022-10-10 DIAGNOSIS — D649 Anemia, unspecified: Secondary | ICD-10-CM | POA: Diagnosis not present

## 2022-10-10 DIAGNOSIS — D132 Benign neoplasm of duodenum: Secondary | ICD-10-CM | POA: Diagnosis not present

## 2022-10-10 DIAGNOSIS — J189 Pneumonia, unspecified organism: Secondary | ICD-10-CM | POA: Diagnosis not present

## 2022-10-10 DIAGNOSIS — I4891 Unspecified atrial fibrillation: Secondary | ICD-10-CM | POA: Diagnosis not present

## 2022-10-10 DIAGNOSIS — I2510 Atherosclerotic heart disease of native coronary artery without angina pectoris: Secondary | ICD-10-CM | POA: Diagnosis not present

## 2022-10-10 DIAGNOSIS — A419 Sepsis, unspecified organism: Secondary | ICD-10-CM | POA: Diagnosis not present

## 2022-10-10 LAB — CBC
HCT: 27.2 % — ABNORMAL LOW (ref 39.0–52.0)
Hemoglobin: 9 g/dL — ABNORMAL LOW (ref 13.0–17.0)
MCH: 30.8 pg (ref 26.0–34.0)
MCHC: 33.1 g/dL (ref 30.0–36.0)
MCV: 93.2 fL (ref 80.0–100.0)
Platelets: 320 10*3/uL (ref 150–400)
RBC: 2.92 MIL/uL — ABNORMAL LOW (ref 4.22–5.81)
RDW: 14.8 % (ref 11.5–15.5)
WBC: 11.5 10*3/uL — ABNORMAL HIGH (ref 4.0–10.5)
nRBC: 0 % (ref 0.0–0.2)

## 2022-10-10 LAB — BASIC METABOLIC PANEL
Anion gap: 5 (ref 5–15)
BUN: 10 mg/dL (ref 8–23)
CO2: 24 mmol/L (ref 22–32)
Calcium: 8 mg/dL — ABNORMAL LOW (ref 8.9–10.3)
Chloride: 107 mmol/L (ref 98–111)
Creatinine, Ser: 1.25 mg/dL — ABNORMAL HIGH (ref 0.61–1.24)
GFR, Estimated: 55 mL/min — ABNORMAL LOW (ref >60)
Glucose, Bld: 108 mg/dL — ABNORMAL HIGH (ref 70–99)
Potassium: 3.9 mmol/L (ref 3.5–5.1)
Sodium: 136 mmol/L (ref 135–145)

## 2022-10-10 LAB — MAGNESIUM: Magnesium: 1.4 mg/dL — ABNORMAL LOW (ref 1.7–2.4)

## 2022-10-10 LAB — SURGICAL PATHOLOGY

## 2022-10-10 LAB — PROTIME-INR
INR: 1.2 (ref 0.8–1.2)
Prothrombin Time: 14.6 seconds (ref 11.4–15.2)

## 2022-10-10 LAB — GLUCOSE, CAPILLARY: Glucose-Capillary: 109 mg/dL — ABNORMAL HIGH (ref 70–99)

## 2022-10-10 MED ORDER — PANTOPRAZOLE SODIUM 40 MG PO TBEC: 40.0000 mg | DELAYED_RELEASE_TABLET | Freq: Two times a day (BID) | ORAL | 0 refills | Status: DC

## 2022-10-10 MED ORDER — MELATONIN 3 MG PO TABS: 6.0000 mg | ORAL_TABLET | Freq: Every day | ORAL | 0 refills | Status: AC

## 2022-10-10 MED ORDER — WARFARIN SODIUM 2.5 MG PO TABS: 2.5000 mg | ORAL_TABLET | Freq: Once | ORAL | Status: DC

## 2022-10-10 MED ORDER — CARVEDILOL 12.5 MG PO TABS: 12.5000 mg | ORAL_TABLET | Freq: Two times a day (BID) | ORAL | 0 refills | Status: DC

## 2022-10-10 MED ORDER — MAGNESIUM SULFATE 2 GM/50ML IV SOLN
2.0000 g | Freq: Once | INTRAVENOUS | Status: AC
  Administered 2022-10-10: 2 g via INTRAVENOUS
  Filled 2022-10-10: qty 50

## 2022-10-10 MED ORDER — SUCRALFATE 1 G PO TABS: 1.0000 g | ORAL_TABLET | Freq: Four times a day (QID) | ORAL | 0 refills | Status: DC

## 2022-10-10 NOTE — Progress Notes (Signed)
Patient slept on and off this shift. No complaints of pain. Continued to observe patient.

## 2022-10-10 NOTE — Discharge Summary (Signed)
Physician Discharge Summary  [name] HER:[id number] DOB: [birth year] DOA: 09/28/2022  PCP: Manon Hilding, MD  Admit date: 09/28/2022  Discharge date: 10/10/2022  Admitted From:Home  Disposition:  SNF  Recommendations for Outpatient Follow-up:  Follow up with PCP in 1-2 weeks Follow-up with GI in 3-4 weeks with consideration for repeat EGD in 3 months to evaluate healing of esophagitis Continue PPI twice daily Continue Carafate as prescribed Continue other home medications as noted below  Home Health: None  Equipment/Devices: None  Discharge Condition:Stable  CODE STATUS: Full  Diet recommendation: Heart Healthy  Brief/Interim Summary: 88 y.o. male with medical history significant for Atria Fibrillation, Crohn's, diastolic CHF, CAD admitted on 09/28/22 with multifocal pneumonia and A-fib with RVR.  He was then noted to have acute blood loss anemia that developed secondary to acute GI bleed with intractable emesis and underwent endoscopy evaluation on 12/21 demonstrating grade D esophagitis without acute bleeding.  He was recommended to remain on Carafate and PPI twice daily per GI and has done so without any further difficulty.  His atrial fibrillation with RVR has resolved and is rate controlled with increased dose of Coreg and underwent 2D echocardiogram on 12/7 with preserved LVEF and no new changes.  He was noted to have sundowning throughout the course of his stay which prolonged his hospitalization, but he improved with the use of Haldol and reorientation.  He has had no further sundowning episodes in the last 48-72 hours and has remained stable for discharge to SNF after PT evaluation.  He has completed his treatment for pneumonia with a 10-day course and is no longer symptomatic from the standpoint.  Discharge Diagnoses:  Principal Problem:   Multifocal pneumonia Active Problems:   Sepsis ([place])   Atrial fibrillation with RVR (HCC)   CAD (coronary artery disease)    Warfarin anticoagulation   Crohn's disease ([place])   Chronic diastolic heart failure (HCC)   Hematemesis   ABLA (acute blood loss anemia)   Adenomatous duodenal polyp  Principal discharge diagnosis: Multifocal pneumonia with suspected aspiration and atrial fibrillation with RVR.  Acute blood loss anemia secondary to grade D esophagitis without acute bleeding noted.  Discharge Instructions  Discharge Instructions     Diet - low sodium heart healthy   Complete by: As directed    Increase activity slowly   Complete by: As directed       Allergies as of 10/10/2022   No Known Allergies      Medication List     STOP taking these medications    cyclobenzaprine 10 MG tablet Commonly known as: FLEXERIL       TAKE these medications    acetaminophen 500 MG tablet Commonly known as: TYLENOL Take 500 mg by mouth every 6 (six) hours as needed for mild pain or moderate pain.   Align 4 MG Caps Take by mouth in the morning.   carvedilol 12.5 MG tablet Commonly known as: COREG Take 1 tablet (12.5 mg total) by mouth 2 (two) times daily. What changed:  medication strength how much to take   dextromethorphan-guaiFENesin 30-600 MG 12hr tablet Commonly known as: MUCINEX DM Take 1 tablet by mouth 2 (two) times daily.   diltiazem 180 MG 24 hr capsule Commonly known as: CARDIZEM CD Take 180 mg by mouth 2 (two) times daily.   Humira 40 MG/0.4ML Pskt Generic drug: Adalimumab Inject 40 mg into the skin every 14 (fourteen) days.   levothyroxine 75 MCG tablet Commonly known as: SYNTHROID Take  75 mcg by mouth daily before breakfast.   magnesium gluconate 500 MG tablet Commonly known as: MAGONATE Take 500 mg by mouth 2 (two) times daily.   melatonin 3 MG Tabs tablet Take 2 tablets (6 mg total) by mouth at bedtime. What changed:  medication strength how much to take   nitroGLYCERIN 0.4 MG SL tablet Commonly known as: NITROSTAT Place 0.4 mg under the tongue every 5 (five)  minutes as needed. For chest pains. May repeat for up to 3 doses.   omeprazole 40 MG capsule Commonly known as: PRILOSEC Take 1 capsule (40 mg total) by mouth daily.   pantoprazole 40 MG tablet Commonly known as: PROTONIX Take 1 tablet (40 mg total) by mouth 2 (two) times daily.   sucralfate 1 g tablet Commonly known as: Carafate Take 1 tablet (1 g total) by mouth 4 (four) times daily.   traZODone 50 MG tablet Commonly known as: DESYREL Take 50 mg by mouth at bedtime.   Vitamin D3 50 MCG (2000 UT) Tabs Take 1 tablet by mouth daily.   warfarin 2.5 MG tablet Commonly known as: COUMADIN Take as directed. If you are unsure how to take this medication, talk to your nurse or doctor. Original instructions: TAKE 1-1 &1/2 TABLETS BY MOUTH DAILY AS DIRECTED        Follow-up Information     [name], [name], MD. Schedule an appointment as soon as possible for a visit in 2 week(s).   Specialty: Family Medicine Contact information: [place] [address] [phone]         [place]. Go in 3 week(s).   Contact information: [address] [place] [phone]               No Known Allergies  Consultations: GI   Procedures/Studies: DG ABD ACUTE 2+V W 1V CHEST  Result Date: 10/04/2022 CLINICAL DATA:  Emesis EXAM: DG ABDOMEN ACUTE WITH 1 VIEW CHEST COMPARISON:  10/02/2022 FINDINGS: There is no evidence of dilated bowel loops or free intraperitoneal air. No radiopaque calculi or other significant radiographic abnormality is seen. Bibasilar pulmonary parenchymal consolidation. Bilateral pleural effusion. Mild vascular congestion. Calcified aorta. NG tube tip is superimposed with the stomach. IMPRESSION: Nonspecific bowel gas pattern. No free air. Bibasilar pulmonary parenchymal consolidation and pleural effusions. Electronically Signed   By: Sammie Bench M.D.   On: 10/04/2022 10:48   DG ABD ACUTE  2+V W 1V CHEST  Result Date: 10/02/2022 CLINICAL DATA:  Abdominal distension EXAM: DG ABDOMEN ACUTE WITH 1 VIEW CHEST COMPARISON:  Acute abdomen series dated 09/29/2022. FINDINGS: Single-view of the chest: Heart size and mediastinal contours are stable. LEFT basilar opacity, likely atelectasis and/or small pleural effusion. Lungs otherwise clear. No pneumothorax is seen. Supine and upright views of the abdomen: Enteric tube is stable in position, loosely coiled in the stomach. No dilated large or small bowel loops are seen. No evidence of free intraperitoneal air. IMPRESSION: 1. LEFT basilar opacity, likely mild atelectasis and/or small pleural effusion. 2. Nonobstructive bowel gas pattern. Enteric tube is stable in position, loosely coiled in the stomach. Electronically Signed   By: Franki Cabot M.D.   On: 10/02/2022 11:30   DG Chest 1 View  Result Date: 10/02/2022 CLINICAL DATA:  NG tube placement EXAM: CHEST  1 VIEW COMPARISON:  10/01/2022 FINDINGS: Small bilateral pleural effusions, right greater than left, mildly progressive. No frank interstitial edema. Heart is normal in size. Enteric tube coursing into the  mid stomach. IMPRESSION: Small bilateral pleural effusions, right greater than left, mildly progressive. Enteric tube coursing into the mid stomach. Electronically Signed   By: Julian Hy M.D.   On: 10/02/2022 00:56   DG Chest 1 View  Result Date: 10/01/2022 CLINICAL DATA:  Shortness of breath EXAM: CHEST  1 VIEW COMPARISON:  Chest x-ray 09/30/2022 FINDINGS: There are patchy opacities in the retrocardiac region. There is a band of atelectasis in the right mid lung. Heart is enlarged. There is no pleural effusion or pneumothorax. No acute fractures are seen. IMPRESSION: Patchy opacities in the retrocardiac region, which may represent atelectasis or infection. Electronically Signed   By: Ronney Asters M.D.   On: 10/01/2022 20:26   ECHOCARDIOGRAM COMPLETE  Result Date: 10/01/2022     ECHOCARDIOGRAM REPORT   Patient Name:   [name] Date of Exam: 10/01/2022 Medical Rec #:  [id number]     Height:       68.0 in Accession #:    [id number]    Weight:       160.9 lb Date of Birth:  1931/09/07     BSA:          1.863 m Patient Age:    88 years      BP:           126/61 mmHg Patient Gender: M             HR:           78 bpm. Exam Location:  [facility] Procedure: 2D Echo, Cardiac Doppler and Color Doppler Indications:    I48.91* Unspeicified atrial fibrillation  History:        Patient has prior history of Echocardiogram examinations, most                 recent 05/19/2020. CHF, CAD, Abnormal ECG, Arrythmias:Atrial                 Fibrillation; Signs/Symptoms:Bacteremia, Shortness of Breath and                 Dyspnea. Pneumonia.  Sonographer:    Roseanna Rainbow RDCS Referring Phys: [id number] [facility]  Sonographer Comments: Technically difficult study due to poor echo windows and suboptimal subcostal window. IMPRESSIONS  1. Left ventricular ejection fraction, by estimation, is 50 to 55%. The left ventricle has low normal function. The left ventricle has no regional wall motion abnormalities. Left ventricular diastolic parameters are indeterminate.  2. Right ventricular systolic function is normal. The right ventricular size is normal. There is normal pulmonary artery systolic pressure.  3. Large pleural effusion in the left lateral region.  4. The mitral valve is grossly normal. Mild mitral valve regurgitation. No evidence of mitral stenosis.  5. The aortic valve is tricuspid. There is mild calcification of the aortic valve. There is mild thickening of the aortic valve. Aortic valve regurgitation is trivial. No aortic stenosis is present.  6. The inferior vena cava is dilated in size with >50% respiratory variability, suggesting right atrial pressure of 8 mmHg. Comparison(s): Large left sided pleural effusion is new. FINDINGS  Left Ventricle: Left ventricular ejection fraction, by estimation, is 50 to  55%. The left ventricle has low normal function. The left ventricle has no regional wall motion abnormalities. The left ventricular internal cavity size was normal in size. Suboptimal image quality limits for assessment of left ventricular hypertrophy. Left ventricular diastolic parameters are indeterminate. Right Ventricle: The right ventricular size is normal. No increase  in right ventricular wall thickness. Right ventricular systolic function is normal. There is normal pulmonary artery systolic pressure. The tricuspid regurgitant velocity is 2.46 m/s, and  with an assumed right atrial pressure of 8 mmHg, the estimated right ventricular systolic pressure is 46.9 mmHg. Left Atrium: Left atrial size was normal in size. Right Atrium: Right atrial size was normal in size. Pericardium: Trivial pericardial effusion is present. Presence of epicardial fat layer. Mitral Valve: The mitral valve is grossly normal. Mild mitral valve regurgitation. No evidence of mitral valve stenosis. Tricuspid Valve: The tricuspid valve is normal in structure. Tricuspid valve regurgitation is trivial. No evidence of tricuspid stenosis. Aortic Valve: The aortic valve is tricuspid. There is mild calcification of the aortic valve. There is mild thickening of the aortic valve. Aortic valve regurgitation is trivial. No aortic stenosis is present. Pulmonic Valve: The pulmonic valve was normal in structure. Pulmonic valve regurgitation is mild. No evidence of pulmonic stenosis. Aorta: The aortic root and ascending aorta are structurally normal, with no evidence of dilitation. Venous: The inferior vena cava is dilated in size with greater than 50% respiratory variability, suggesting right atrial pressure of 8 mmHg. IAS/Shunts: No atrial level shunt detected by color flow Doppler. Additional Comments: There is a large pleural effusion in the left lateral region.  LEFT VENTRICLE PLAX 2D LVIDd:         4.40 cm LVIDs:         3.00 cm LV PW:         1.20  cm LV IVS:        0.90 cm LVOT diam:     2.20 cm LV SV:         57 LV SV Index:   30 LVOT Area:     3.80 cm  LV Volumes (MOD) LV vol d, MOD A2C: 91.2 ml LV vol d, MOD A4C: 66.2 ml LV vol s, MOD A2C: 42.9 ml LV vol s, MOD A4C: 27.7 ml LV SV MOD A2C:     48.3 ml LV SV MOD A4C:     66.2 ml LV SV MOD BP:      43.2 ml RIGHT VENTRICLE            IVC RV S prime:     9.14 cm/s  IVC diam: 2.40 cm TAPSE (M-mode): 1.5 cm LEFT ATRIUM           Index        RIGHT ATRIUM           Index LA diam:      4.60 cm 2.47 cm/m   RA Area:     15.80 cm LA Vol (A2C): 49.1 ml 26.35 ml/m  RA Volume:   37.10 ml  19.91 ml/m LA Vol (A4C): 46.6 ml 25.01 ml/m  AORTIC VALVE             PULMONIC VALVE LVOT Vmax:   88.35 cm/s  PR End Diast Vel: 1.27 msec LVOT Vmean:  59.600 cm/s LVOT VTI:    0.150 m  AORTA Ao Root diam: 3.20 cm Ao Asc diam:  3.70 cm MITRAL VALVE               TRICUSPID VALVE MV Area (PHT): 4.06 cm    TR Peak grad:   24.2 mmHg MV Decel Time: 187 msec    TR Vmax:        246.00 cm/s MV E velocity: 79.00 cm/s  SHUNTS                            Systemic VTI:  0.15 m                            Systemic Diam: 2.20 cm Rudean Haskell MD Electronically signed by Rudean Haskell MD Signature Date/Time: 10/01/2022/7:01:30 PM    Final    DG Chest Port 1 View  Result Date: 09/30/2022 CLINICAL DATA:  Shortness of breath. EXAM: PORTABLE CHEST 1 VIEW COMPARISON:  CT of the chest September 28, 2022. KUB and chest x-ray September 29, 2022. FINDINGS: Dilated stomach and bowel loops in the upper abdomen. The cardiomediastinal silhouette is stable. No pneumothorax. No nodules or masses. Bibasilar opacities consistent with multifocal pneumonia on the recent chest CT have not been well appreciated on chest x-rays but there is no significant change. A linear opacity in the right mid lung is likely fluid in the fissure. IMPRESSION: 1. Bibasilar opacities consistent with multifocal pneumonia on the recent chest CT  have not been well appreciated on chest x-rays but there is no significant change. 2. Dilated stomach and bowel loops in the upper abdomen. Recommend dedicated imaging of the abdomen. Electronically Signed   By: Dorise Bullion III M.D.   On: 09/30/2022 19:33   DG ABD ACUTE 2+V W 1V CHEST  Result Date: 09/29/2022 CLINICAL DATA:  Dyspnea and respiratory abnormality EXAM: DG ABDOMEN ACUTE WITH 1 VIEW CHEST COMPARISON:  Radiograph 09/28/2022 FINDINGS: Unchanged cardiomediastinal silhouette. There is bibasilar airspace disease, as seen on recent CT. No large effusion. No evidence of pneumothorax. No acute osseous abnormality. Thoracic spondylosis. Distension of bowel in the upper abdomen. IMPRESSION: Bibasilar airspace disease, as seen on recent CT, consistent with multifocal pneumonia/aspiration. Electronically Signed   By: Maurine Simmering M.D.   On: 09/29/2022 10:04   CT Angio Chest PE W/Cm &/Or Wo Cm  Result Date: 09/28/2022 CLINICAL DATA:  Two-week history of cough with acute onset nausea/vomiting EXAM: CT ANGIOGRAPHY CHEST WITH CONTRAST TECHNIQUE: Multidetector CT imaging of the chest was performed using the standard protocol during bolus administration of intravenous contrast. Multiplanar CT image reconstructions and MIPs were obtained to evaluate the vascular anatomy. RADIATION DOSE REDUCTION: This exam was performed according to the departmental dose-optimization program which includes automated exposure control, adjustment of the mA and/or kV according to patient size and/or use of iterative reconstruction technique. CONTRAST:  59m OMNIPAQUE IOHEXOL 350 MG/ML SOLN COMPARISON:  Chest radiograph dated 09/28/2022, CT abdomen and pelvis dated 04/11/2022 FINDINGS: Cardiovascular: The study is high quality for the evaluation of pulmonary embolism. There are no filling defects in the central, lobar, segmental or subsegmental pulmonary artery branches to suggest acute pulmonary embolism. Great vessels are normal  in course and caliber. Left atrial enlargement. No significant pericardial fluid/thickening. Coronary artery calcifications and aortic atherosclerosis. Mediastinum/Nodes: Thyroid gland without nodules meeting criteria for imaging follow-up by size. Patulous esophagus containing layering secretions. No pathologically enlarged axillary, supraclavicular, mediastinal, or hilar lymph nodes. Lungs/Pleura: The central airways are patent. Trace layering secretions within the trachea. Mild bronchial wall thickening. Ground-glass densities and consolidation involving the lower lobes bilaterally. Subsegmental atelectasis of the lingula and middle lobe with additional scattered ground-glass nodules in the right upper lobe. No pneumothorax. No pleural effusion. Upper abdomen: Subcentimeter hypodensities in the right hepatic lobe (5:92), too small to characterize but unchanged from prior examinations. Cholecystectomy. 2.2  cm peripherally calcified medial splenic cyst is again seen (5:90). Findings of prior bowel resection in the right upper quadrant. Musculoskeletal: No acute or abnormal lytic or blastic osseous lesions. Review of the MIP images confirms the above findings. IMPRESSION: 1. No evidence of pulmonary embolism. 2. Ground-glass densities and consolidation involving the lower lobes bilaterally with additional scattered ground-glass nodules in the right upper lobe. Findings are suspicious for multifocal infection/aspiration. 3. Coronary artery calcifications. Aortic Atherosclerosis (ICD10-I70.0). Electronically Signed   By: Darrin Nipper M.D.   On: 09/28/2022 14:30   DG Chest 2 View  Result Date: 09/28/2022 CLINICAL DATA:  Dyspnea, nausea, vomiting EXAM: CHEST - 2 VIEW COMPARISON:  08/27/2022 FINDINGS: Normal heart size, mediastinal contours, and pulmonary vascularity. Minimal bibasilar atelectasis. Lungs otherwise clear. No pulmonary infiltrate, pleural effusion, or pneumothorax. Bowel interposition between liver and  diaphragm. No osseous abnormalities. IMPRESSION: Minimal bibasilar atelectasis. Electronically Signed   By: Lavonia Dana M.D.   On: 09/28/2022 09:30     Discharge Exam: Vitals:   10/10/22 0526 10/10/22 0830  BP: 127/65 (!) 140/80  Pulse: 87   Resp: 16   Temp: (!) 97.3 F (36.3 C)   SpO2: 96%    Vitals:   10/09/22 0431 10/09/22 2058 10/10/22 0526 10/10/22 0830  BP: 105/66 129/70 127/65 (!) 140/80  Pulse: 87 (!) 106 87   Resp: 18 18 16    Temp: 98.3 F (36.8 C) 98.6 F (37 C) (!) 97.3 F (36.3 C)   TempSrc:  Oral Oral   SpO2: 94% 98% 96%   Weight:      Height:        General: Pt is alert, awake, not in acute distress Cardiovascular: RRR, S1/S2 +, no rubs, no gallops Respiratory: CTA bilaterally, no wheezing, no rhonchi Abdominal: Soft, NT, ND, bowel sounds + Extremities: no edema, no cyanosis    The results of significant diagnostics from this hospitalization (including imaging, microbiology, ancillary and laboratory) are listed below for reference.     Microbiology: Recent Results (from the past 240 hour(s))  KOH prep     Status: None   Collection Time: 10/05/22 12:47 PM   Specimen: PATH GI Other  Result Value Ref Range Status   Specimen Description ESOPHAGUS  Final   Special Requests NONE  Final   KOH Prep   Final    NO YEAST OR FUNGAL ELEMENTS SEEN Performed at [facility], [address], [place], [place] [address]    Report Status 10/05/2022 FINAL  Final     Labs: BNP (last 3 results) Recent Labs    05/28/22 0747  BNP 482.7*   Basic Metabolic Panel: Recent Labs  Lab 10/04/22 0307 10/06/22 0306 10/07/22 0315 10/08/22 0554 10/09/22 0435 10/10/22 0442  NA 140 135 139 136 136 136  K 3.8 3.2* 3.7 3.8 3.9 3.9  CL 111 105 110 107 106 107  CO2 24 24 24 23 23 24   GLUCOSE 96 95 96 114* 106* 108*  BUN 27* 10 8 9 11 10   CREATININE 1.19 1.10 1.09 1.31* 1.35* 1.25*  CALCIUM 7.8* 7.9* 8.0* 8.0* 8.1* 8.0*  MG  --  1.5*  --  1.5* 1.8 1.4*  PHOS 1.8*  --    --   --   --   --    Liver Function Tests: Recent Labs  Lab 10/04/22 0307  ALBUMIN 2.2*   No results for input(s): "LIPASE", "AMYLASE" in the last 168 hours. No results for input(s): "AMMONIA" in the last 168  hours. CBC: Recent Labs  Lab 10/04/22 0307 10/05/22 0531 10/06/22 1004 10/09/22 0435 10/10/22 0442  WBC 7.1 9.0  --  12.2* 11.5*  NEUTROABS 3.0 4.5  --   --   --   HGB 9.1* 9.4* 10.6* 8.7* 9.0*  HCT 27.0* 28.4* 30.7* 25.8* 27.2*  MCV 93.8 95.0  --  93.8 93.2  PLT 271 279  --  328 320   Cardiac Enzymes: No results for input(s): "CKTOTAL", "CKMB", "CKMBINDEX", "TROPONINI" in the last 168 hours. BNP: Invalid input(s): "POCBNP" CBG: Recent Labs  Lab 10/09/22 0738 10/09/22 1121 10/09/22 1652 10/09/22 2101 10/10/22 0713  GLUCAP 113* 108* 103* 108* 109*   D-Dimer No results for input(s): "DDIMER" in the last 72 hours. Hgb A1c No results for input(s): "HGBA1C" in the last 72 hours. Lipid Profile No results for input(s): "CHOL", "HDL", "LDLCALC", "TRIG", "CHOLHDL", "LDLDIRECT" in the last 72 hours. Thyroid function studies No results for input(s): "TSH", "T4TOTAL", "T3FREE", "THYROIDAB" in the last 72 hours.  Invalid input(s): "FREET3" Anemia work up No results for input(s): "VITAMINB12", "FOLATE", "FERRITIN", "TIBC", "IRON", "RETICCTPCT" in the last 72 hours. Urinalysis    Component Value Date/Time   COLORURINE YELLOW 09/28/2022 1239   APPEARANCEUR CLEAR 09/28/2022 1239   APPEARANCEUR Clear 03/16/2021 1450   LABSPEC 1.015 09/28/2022 1239   PHURINE 6.0 09/28/2022 1239   GLUCOSEU NEGATIVE 09/28/2022 1239   HGBUR SMALL (A) 09/28/2022 1239   BILIRUBINUR NEGATIVE 09/28/2022 1239   BILIRUBINUR Negative 03/16/2021 1450   KETONESUR 5 (A) 09/28/2022 1239   PROTEINUR NEGATIVE 09/28/2022 1239   UROBILINOGEN negative (A) 03/23/2020 1506   NITRITE NEGATIVE 09/28/2022 1239   LEUKOCYTESUR NEGATIVE 09/28/2022 1239   Sepsis Labs Recent Labs  Lab 10/04/22 0307  10/05/22 0531 10/09/22 0435 10/10/22 0442  WBC 7.1 9.0 12.2* 11.5*   Microbiology Recent Results (from the past 240 hour(s))  KOH prep     Status: None   Collection Time: 10/05/22 12:47 PM   Specimen: PATH GI Other  Result Value Ref Range Status   Specimen Description ESOPHAGUS  Final   Special Requests NONE  Final   KOH Prep   Final    NO YEAST OR FUNGAL ELEMENTS SEEN Performed at [facility], [address] [place], [place] [address]    Report Status 10/05/2022 FINAL  Final     Time coordinating discharge: 35 minutes  SIGNED:   Rodena Goldmann, DO [facility] 10/10/2022, 9:50 AM  If 7PM-7AM, please contact night-coverage [email or web address]

## 2022-10-10 NOTE — Consult Note (Signed)
ANTICOAGULATION CONSULT NOTE -   Pharmacy Consult for warfarin Indication: atrial fibrillation  No Known Allergies  Patient Measurements: Height: 5' 8"  (172.7 cm) Weight: 68.6 kg (151 lb 3.8 oz) IBW/kg (Calculated) : 68.4  Vital Signs: Temp: 97.3 F (36.3 C) (12/26 0526) Temp Source: Oral (12/26 0526) BP: 127/65 (12/26 0526) Pulse Rate: 87 (12/26 0526)  Labs: Recent Labs    10/08/22 0554 10/09/22 0435 10/10/22 0442  HGB  --  8.7* 9.0*  HCT  --  25.8* 27.2*  PLT  --  328 320  LABPROT 16.1* 15.3* 14.6  INR 1.3* 1.2 1.2  CREATININE 1.31* 1.35* 1.25*     Estimated Creatinine Clearance: 38.8 mL/min (A) (by C-G formula based on SCr of 1.25 mg/dL (H)).   Medical History: Past Medical History:  Diagnosis Date   Anemia    Atrial fibrillation (HCC)    BPH (benign prostatic hyperplasia)    CAD (coronary artery disease)    Catheterization 2004, mild/moderate nonobstructive disease  /   nuclear, 2007, small inferior scar // no ischemia   Crohn's disease ([place])    Elevated PSA    GERD (gastroesophageal reflux disease)    History of kidney stones    History of pneumonia    Hypothyroidism    Mitral regurgitation    SBO (small bowel obstruction) ([place])    Urinary retention    Wears glasses     Medications:  Medications Prior to Admission  Medication Sig Dispense Refill Last Dose   acetaminophen (TYLENOL) 500 MG tablet Take 500 mg by mouth every 6 (six) hours as needed for mild pain or moderate pain.   unk   carvedilol (COREG) 6.25 MG tablet TAKE ONE TABLET BY MOUTH TWICE DAILY 60 tablet 6 09/27/2022   Cholecalciferol (VITAMIN D3) 50 MCG (2000 UT) TABS Take 1 tablet by mouth daily.   09/27/2022   cyclobenzaprine (FLEXERIL) 10 MG tablet Take 10 mg by mouth at bedtime.   09/27/2022   dextromethorphan-guaiFENesin (MUCINEX DM) 30-600 MG 12hr tablet Take 1 tablet by mouth 2 (two) times daily.   09/27/2022   diltiazem (CARDIZEM CD) 180 MG 24 hr capsule Take 180 mg by mouth 2  (two) times daily.   09/27/2022   levothyroxine (SYNTHROID, LEVOTHROID) 75 MCG tablet Take 75 mcg by mouth daily before breakfast.   09/27/2022   magnesium gluconate (MAGONATE) 500 MG tablet Take 500 mg by mouth 2 (two) times daily.   09/27/2022   Melatonin 12 MG TABS Take 15 mg by mouth at bedtime.   09/27/2022   nitroGLYCERIN (NITROSTAT) 0.4 MG SL tablet Place 0.4 mg under the tongue every 5 (five) minutes as needed. For chest pains. May repeat for up to 3 doses.   unk   omeprazole (PRILOSEC) 40 MG capsule Take 1 capsule (40 mg total) by mouth daily. 90 capsule 3 09/27/2022   Probiotic Product (ALIGN) 4 MG CAPS Take by mouth in the morning.   09/27/2022   traZODone (DESYREL) 50 MG tablet Take 50 mg by mouth at bedtime.   09/27/2022   warfarin (COUMADIN) 2.5 MG tablet TAKE 1-1 &1/2 TABLETS BY MOUTH DAILY AS DIRECTED 45 tablet 6 09/27/2022 at 0900   Adalimumab (HUMIRA) 40 MG/0.4ML PSKT Inject 40 mg into the skin every 14 (fourteen) days.       Assessment: Pt is on chronic warfarin therapy for Afib.  Warfarin has been on hold secondary to GI bleed.  MD ordered to resume therapy.  Home dose listed as 1.25 mg  on Wed and 2.5 mg ROW.  INR 1.2   Goal of Therapy:  INR 2-3 Monitor platelets by anticoagulation protocol: Yes   Plan:  Warfarin 2.5 mg x 1 dose. Will be cautious with dosing since recent supratherapeutic INR Monitor daily INR and s/s of bleeding  Margot Ables, PharmD Clinical Pharmacist 10/10/2022 8:29 AM

## 2022-10-10 NOTE — TOC Transition Note (Signed)
Transition of Care [facility]) - CM/SW Discharge Note   Patient Details  Name: [name] MRN: [id number] Date of Birth: 04/22/1931  [facility] CM/SW Contact:  Iona Beard, [place] Phone Number: 10/10/2022, 11:42 AM  Clinical Narrative:    CSW updated by admissions at [facility] who states that pt can admit to their facility for SNF today. Pts insurance auth approved. CSW updated RN and MD. D/C clinicals sent to facility. CSW provided RN with numbers for room and report. CSW set up [facility] transportation for pt. [place] waiver to be placed in shadow chart. Family at bedside and updated on D/C to facility today. TOC signing off.   Final next level of care: Skilled Nursing Facility Barriers to Discharge: Barriers Resolved   Patient Goals and CMS Choice CMS Medicare.gov Compare Post Acute Care list provided to:: Patient Represenative (must comment) Choice offered to / list presented to : Spouse, Adult Children  Discharge Placement                Patient chooses bed at: Other - please specify in the comment section below: (Now named [facility]) Patient to be transferred to facility by: [facility] Name of family member notified: Inez Catalina (spouse) Patient and family notified of of transfer: 10/10/22  Discharge Plan and Services Additional resources added to the After Visit Summary for                                       Social Determinants of Health (SDOH) Interventions SDOH Screenings   Food Insecurity: No Food Insecurity (10/02/2022)  Housing: Low Risk  (10/02/2022)  Transportation Needs: No Transportation Needs (10/02/2022)  Utilities: Not At Risk (10/02/2022)  Tobacco Use: Low Risk  (10/05/2022)     Readmission Risk Interventions     No data to display

## 2022-10-10 NOTE — Progress Notes (Signed)
Patient is stable and ready for discharge to [facility]. Patient going to room 133. Patient wife and daughter and son in law at bedside. Writer called and gave report to [place], Therapist, sports. Patient's family packed patient's belongings, NT dressed patient and charge nurse transported patient to [facility] for discharge and packet given to driver.

## 2022-10-11 ENCOUNTER — Telehealth (INDEPENDENT_AMBULATORY_CARE_PROVIDER_SITE_OTHER): Payer: Self-pay

## 2022-10-11 ENCOUNTER — Telehealth: Payer: Self-pay | Admitting: Cardiology

## 2022-10-11 ENCOUNTER — Other Ambulatory Visit (INDEPENDENT_AMBULATORY_CARE_PROVIDER_SITE_OTHER): Payer: Self-pay | Admitting: Gastroenterology

## 2022-10-11 DIAGNOSIS — K5000 Crohn's disease of small intestine without complications: Secondary | ICD-10-CM | POA: Insufficient documentation

## 2022-10-11 DIAGNOSIS — K50018 Crohn's disease of small intestine with other complication: Secondary | ICD-10-CM

## 2022-10-11 NOTE — Telephone Encounter (Signed)
Daughter called to report patient is in [place] and canceled patient's Coumadin check on Jan 2 as she is not sure when patient will be released.  Daughter would like a call back on how to address patient's next Coumadin check.

## 2022-10-11 NOTE — Telephone Encounter (Signed)
Spoke with Daughter Sharyn Lull.  Pt is at [facility] center after being hospitalized for pneumonia.  Will cancel INR appt on 10/17/21.  Daughter/pt will call when he is d/c to make f/u appt.  Dr Quintin Alto is following blood work while in [employer].

## 2022-10-11 NOTE — Telephone Encounter (Signed)
Thanks, yes, I'll call them later today

## 2022-10-11 NOTE — Telephone Encounter (Addendum)
Daughter Jyles Sontag called [phone] Patient daughter Erasmo Vertz called states she had received a message on vm and could not understand it. She says she thought It was the doctor. She says she thinks the person said they would call back at a later time to discuss "treatment". Please advise. Patient had a Egd done on 10/05/2022.

## 2022-10-12 ENCOUNTER — Telehealth: Payer: Self-pay | Admitting: Pharmacy Technician

## 2022-10-12 NOTE — Telephone Encounter (Addendum)
Dr. Jenetta Downer. Fyi note:  Auth Submission: approved Payer: [employer] ADVT Medication & CPT/J Code(s) submitted: Stelara Infusion (Ustekinumab) [id number] Route of submission (phone, fax, portal):  Phone [phone] Fax [phone] Auth type: Buy/Bill Units/visits requested: X1 DOSE Reference number: [id number] Approval from: 10/13/22  to 01/11/23  at [facility] [place]  Patient will be scheduled as soon as possible.  @[email or web address] , Please submit auth for Stelara maintenance dosing.  Thanks Maudie Mercury

## 2022-10-13 ENCOUNTER — Other Ambulatory Visit (HOSPITAL_COMMUNITY): Payer: Self-pay

## 2022-10-17 ENCOUNTER — Encounter: Payer: Self-pay | Admitting: Gastroenterology

## 2022-10-18 ENCOUNTER — Telehealth (INDEPENDENT_AMBULATORY_CARE_PROVIDER_SITE_OTHER): Payer: Self-pay

## 2022-10-18 ENCOUNTER — Encounter (HOSPITAL_COMMUNITY): Payer: Self-pay | Admitting: Gastroenterology

## 2022-10-18 NOTE — Telephone Encounter (Signed)
Scheduled per Daughter Sharyn Lull 11/03/2022 at 2 pm, at [place].

## 2022-10-18 NOTE — Telephone Encounter (Signed)
Patient daughter called regarding the stelara infusion she says patient is scheduled for the infusion on 11/03/2022 at 2 pm, at [facility]. She says they had a sooner time,but she did not know if he needed to be seen by the Doctor in [place] that is supposed to remove the tumor first. She wanted to know if you did place the referral for that to be done. Please advise.

## 2022-10-23 DIAGNOSIS — Z9181 History of falling: Secondary | ICD-10-CM | POA: Diagnosis not present

## 2022-10-23 DIAGNOSIS — R2681 Unsteadiness on feet: Secondary | ICD-10-CM | POA: Diagnosis not present

## 2022-10-23 DIAGNOSIS — M6281 Muscle weakness (generalized): Secondary | ICD-10-CM | POA: Diagnosis not present

## 2022-10-24 DIAGNOSIS — J189 Pneumonia, unspecified organism: Secondary | ICD-10-CM | POA: Diagnosis not present

## 2022-10-24 DIAGNOSIS — M6281 Muscle weakness (generalized): Secondary | ICD-10-CM | POA: Diagnosis not present

## 2022-10-24 DIAGNOSIS — A419 Sepsis, unspecified organism: Secondary | ICD-10-CM | POA: Diagnosis not present

## 2022-10-24 DIAGNOSIS — R2689 Other abnormalities of gait and mobility: Secondary | ICD-10-CM | POA: Diagnosis not present

## 2022-10-24 DIAGNOSIS — I5032 Chronic diastolic (congestive) heart failure: Secondary | ICD-10-CM | POA: Diagnosis not present

## 2022-10-24 NOTE — Telephone Encounter (Signed)
I was unable to reach the daughter Sharyn Lull, But I did speak with the patient and let them know ok to go ahead with the infusion on 11/03/2022 at [facility] and that Dr. Jenetta Downer would be reaching out to [place]. Patient and wife state understanding. After the infusion will need to send to a specialty pharmacy if can't afford we will apply for patient assistance with [employer] Overall.

## 2022-10-25 DIAGNOSIS — M6281 Muscle weakness (generalized): Secondary | ICD-10-CM | POA: Diagnosis not present

## 2022-10-25 DIAGNOSIS — R2681 Unsteadiness on feet: Secondary | ICD-10-CM | POA: Diagnosis not present

## 2022-10-25 DIAGNOSIS — Z9181 History of falling: Secondary | ICD-10-CM | POA: Diagnosis not present

## 2022-10-26 ENCOUNTER — Ambulatory Visit (INDEPENDENT_AMBULATORY_CARE_PROVIDER_SITE_OTHER): Payer: PPO | Admitting: Gastroenterology

## 2022-10-26 ENCOUNTER — Encounter (INDEPENDENT_AMBULATORY_CARE_PROVIDER_SITE_OTHER): Payer: Self-pay | Admitting: Gastroenterology

## 2022-10-26 ENCOUNTER — Telehealth: Payer: Self-pay | Admitting: *Deleted

## 2022-10-26 VITALS — BP 123/60 | HR 77 | Temp 97.9°F | Ht 68.0 in | Wt 150.4 lb

## 2022-10-26 DIAGNOSIS — K2100 Gastro-esophageal reflux disease with esophagitis, without bleeding: Secondary | ICD-10-CM

## 2022-10-26 DIAGNOSIS — K50018 Crohn's disease of small intestine with other complication: Secondary | ICD-10-CM

## 2022-10-26 DIAGNOSIS — E340 Carcinoid syndrome: Secondary | ICD-10-CM | POA: Insufficient documentation

## 2022-10-26 NOTE — Telephone Encounter (Signed)
  Request for patient to stop medication prior to procedure or is needing cleareance  10/26/22  [name] 11/29/1930  What type of surgery is being performed?  Esophagogastroduodenoscopy   When is surgery scheduled? TBD  What type of clearance is required (medical or pharmacy to hold medication or both? medication  Are there any medications that need to be held prior to surgery and how long? Warfarin x 5 days  Name of physician performing surgery?  [facility] Phone: [phone] Fax: [phone]  Anethesia type (none, local, MAC, general)? MAC

## 2022-10-26 NOTE — Progress Notes (Signed)
[name], M.D. Gastroenterology & Hepatology [facility] [address] [place], [place] [address]  Primary Care Physician: Manon Hilding, MD [place] [address]  I will communicate my assessment and recommendations to the referring MD via EMR.  Problems: Small bowel Crohn's disease status post resection complicated by recurrent stricture  History of C. difficile colitis GERD with severe esophagitis. Duodenal carcinoid  History of Present Illness: [name] is a 89 y.o. male  with past medical history of small bowel Crohn's disease status post resection complicated by recurrent stricture on Humira, C. difficile colitis, BPH, coronary artery disease, atrial fibrillation, GERD, hypothyroidism, who presents for discussion of duodenal carcinoid and Crohn's disease.  The patient was last seen when he was hospitalized in December 2023.  At that time he was found to have pneumonia and required ICU hospital stay.  He presented emesis with cough and one of the episodes was characterized by showing coffee-ground emesis.  Due to this, he underwent an EGD where he was found to have grade D esophagitis, also presence of nodularity in the second portion of the duodenum which came back positive for a well-differentiated neuroendocrine tumor, there was a small polypoid area which were resected and clipped x 1 -this tissue was the accessory duct.  The patient did not present any abdominal pain or discomfort after undergoing resection.  I discussed the pathology with the family members but the patient was not aware of the carcinoid diagnosis.  Per discussion with daughter, we will discuss this in person and this appointment was set up for today.  Patient reports feeling well and denies any complaints.  The patient denies having any nausea, vomiting, fever, chills, hematochezia, melena, hematemesis, abdominal distention, abdominal pain, diarrhea,  jaundice, pruritus or weight loss.  Has presented 1 regular bowel movement every day.  No more episode of abdominal distention.  Patient comes to the office visit with his wife and daughter.  Wife is concerned that he was switched to another agent from Humira which she does not exactly understand why.  She is concerned as he presented major improvement of his symptoms with Humira.  Notably, after the patient was diagnosed with a carcinoid syndrome, I discussed with the daughter the potential risk of neuroendocrine tumor dissemination, for which I advised to switch to a medication that has less systemic risks.  He is supposed to start Stelara on Monday.  Last EGD as described above. Last Colonoscopy: 03/23/2016 - Two small polyps at the recto-sigmoid colon and in the cecum, removed with a cold snare. Resected and retrieved. - Mild diverticulosis in the sigmoid colon. - External and internal hemorrhoids.  Past Medical History: Past Medical History:  Diagnosis Date   Anemia    Atrial fibrillation (HCC)    BPH (benign prostatic hyperplasia)    CAD (coronary artery disease)    Catheterization 2004, mild/moderate nonobstructive disease  /   nuclear, 2007, small inferior scar // no ischemia   Crohn's disease ([place])    Elevated PSA    GERD (gastroesophageal reflux disease)    History of kidney stones    History of pneumonia    Hypothyroidism    Mitral regurgitation    SBO (small bowel obstruction) ([place])    Urinary retention    Wears glasses     Past Surgical History: Past Surgical History:  Procedure Laterality Date   AGILE CAPSULE  10/18/2011   Procedure: AGILE CAPSULE;  Surgeon: Mechele Dawley  Laural Golden, MD;  [facility];  Service: Endoscopy;  Laterality: N/A;  730   BIOPSY  10/05/2022   Procedure: BIOPSY;  Surgeon: Harvel Quale, MD;  Location: [facility];  Service: Gastroenterology;;   CARDIAC CATHETERIZATION  2004   CATARACT EXTRACTION W/PHACO Right 06/27/2021    Procedure: CATARACT EXTRACTION PHACO AND INTRAOCULAR LENS PLACEMENT ([place]);  Surgeon: Baruch Goldmann, MD;  Location: [facility];  Service: Ophthalmology;  Laterality: Right;  CDE 21.98   CATARACT EXTRACTION W/PHACO Left 07/11/2021   Procedure: CATARACT EXTRACTION PHACO AND INTRAOCULAR LENS PLACEMENT LEFT EYE;  Surgeon: Baruch Goldmann, MD;  Location: [facility];  Service: Ophthalmology;  Laterality: Left;  CDE=10.42   CHOLECYSTECTOMY  2010   Dr. Anthony Sar   COLONOSCOPY  2008   [place]   COLONOSCOPY N/A 03/23/2016   Procedure: COLONOSCOPY;  Surgeon: Rogene Houston, MD;  [facility];  Service: Endoscopy;  Laterality: N/A;  1:00   CYSTOSCOPY WITH INSERTION OF UROLIFT     ESOPHAGEAL BRUSHING  10/05/2022   Procedure: ESOPHAGEAL BRUSHING;  Surgeon: Harvel Quale, MD;  [facility];  Service: Gastroenterology;;   ESOPHAGOGASTRODUODENOSCOPY  11/08/2011   Procedure: ESOPHAGOGASTRODUODENOSCOPY (EGD);  Surgeon: Rogene Houston, MD;  [facility];  Service: Endoscopy;  Laterality: N/A;  300   ESOPHAGOGASTRODUODENOSCOPY (EGD) WITH PROPOFOL N/A 10/05/2022   Procedure: ESOPHAGOGASTRODUODENOSCOPY (EGD) WITH PROPOFOL;  Surgeon: Harvel Quale, MD;  [facility];  Service: Gastroenterology;  Laterality: N/A;   HEMOSTASIS CLIP PLACEMENT  10/05/2022   Procedure: HEMOSTASIS CLIP PLACEMENT;  Surgeon: Harvel Quale, MD;  [facility];  Service: Gastroenterology;;   POLYPECTOMY  03/23/2016   Procedure: POLYPECTOMY;  Surgeon: Rogene Houston, MD;  [facility];  Service: Endoscopy;;  Cecal polyp removed via cold forceps recto-sigmoid polyp removed via cold snare   POLYPECTOMY  10/05/2022   Procedure: POLYPECTOMY;  Surgeon: Harvel Quale, MD;  [facility];  Service: Gastroenterology;;   TRANSURETHRAL RESECTION OF PROSTATE N/A 04/29/2020   Procedure: TRANSURETHRAL RESECTION OF THE PROSTATE (TURP);  Surgeon:  Franchot Gallo, MD;  Location: [facility];  Service: Urology;  Laterality: N/A;  23 MINS    Family History: Family History  Problem Relation Age of Onset   Colon cancer Mother    Heart disease Father    Stroke Brother    Healthy Daughter     Social History: Social History   Tobacco Use  Smoking Status Never   Passive exposure: Never  Smokeless Tobacco Never   Social History   Substance and Sexual Activity  Alcohol Use No   Alcohol/week: 0.0 standard drinks of alcohol   Social History   Substance and Sexual Activity  Drug Use No    Allergies: No Known Allergies  Medications: Current Outpatient Medications  Medication Sig Dispense Refill   acetaminophen (TYLENOL) 500 MG tablet Take 500 mg by mouth every 6 (six) hours as needed for mild pain or moderate pain.     carvedilol (COREG) 12.5 MG tablet Take 1 tablet (12.5 mg total) by mouth 2 (two) times daily. 60 tablet 0   Cholecalciferol (VITAMIN D3) 50 MCG (2000 UT) TABS Take 1 tablet by mouth daily.     diltiazem (CARDIZEM CD) 180 MG 24 hr capsule Take 180 mg by mouth 2 (two) times daily.     ferrous sulfate (FEROSUL) 325 (65 FE) MG tablet Take 325 mg by mouth daily with breakfast.  levothyroxine (SYNTHROID, LEVOTHROID) 75 MCG tablet Take 75 mcg by mouth daily before breakfast.     MAGNESIUM GLUCONATE PO Take 400 mg by mouth 2 (two) times daily.     melatonin 3 MG TABS tablet Take 2 tablets (6 mg total) by mouth at bedtime. 60 tablet 0   nitroGLYCERIN (NITROSTAT) 0.4 MG SL tablet Place 0.4 mg under the tongue every 5 (five) minutes as needed. For chest pains. May repeat for up to 3 doses.     OVER THE COUNTER MEDICATION Calcium '300mg'$  one twice a day     pantoprazole (PROTONIX) 40 MG tablet Take 1 tablet (40 mg total) by mouth 2 (two) times daily. 60 tablet 0   Probiotic Product (ALIGN) 4 MG CAPS Take by mouth in the morning.     sucralfate (CARAFATE) 1 g tablet Take 1 tablet (1 g total) by mouth 4  (four) times daily. 120 tablet 0   traZODone (DESYREL) 50 MG tablet Take 50 mg by mouth at bedtime.     warfarin (COUMADIN) 2.5 MG tablet TAKE 1-1 &1/2 TABLETS BY MOUTH DAILY AS DIRECTED 45 tablet 6   Adalimumab (HUMIRA) 40 MG/0.4ML PSKT Inject 40 mg into the skin every 14 (fourteen) days. (Patient not taking: Reported on 10/26/2022)     No current facility-administered medications for this visit.    Review of Systems: GENERAL: negative for malaise, night sweats HEENT: No changes in hearing or vision, no nose bleeds or other nasal problems. NECK: Negative for lumps, goiter, pain and significant neck swelling RESPIRATORY: Negative for cough, wheezing CARDIOVASCULAR: Negative for chest pain, leg swelling, palpitations, orthopnea GI: SEE HPI MUSCULOSKELETAL: Negative for joint pain or swelling, back pain, and muscle pain. SKIN: Negative for lesions, rash PSYCH: Negative for sleep disturbance, mood disorder and recent psychosocial stressors. HEMATOLOGY Negative for prolonged bleeding, bruising easily, and swollen nodes. ENDOCRINE: Negative for cold or heat intolerance, polyuria, polydipsia and goiter. NEURO: negative for tremor, gait imbalance, syncope and seizures. The remainder of the review of systems is noncontributory.   Physical Exam: BP 123/60 (BP Location: Left Arm, Patient Position: Sitting, Cuff Size: Normal)   Pulse 77   Temp 97.9 F (36.6 C) (Oral)   Ht '5\' 8"'$  (1.727 m)   Wt 150 lb 6.4 oz (68.2 kg)   BMI 22.87 kg/m  GENERAL: The patient is AO x3, in no acute distress. HEENT: Head is normocephalic and atraumatic. EOMI are intact. Mouth is well hydrated and without lesions. NECK: Supple. No masses LUNGS: Clear to auscultation. No presence of rhonchi/wheezing/rales. Adequate chest expansion HEART: RRR, normal s1 and s2. ABDOMEN: Soft, nontender, no guarding, no peritoneal signs, and nondistended. BS +. No masses. EXTREMITIES: Without any cyanosis, clubbing, rash, lesions or  edema. NEUROLOGIC: AOx3, no focal motor deficit. SKIN: no jaundice, no rashes  Imaging/Labs: as above  I personally reviewed and interpreted the available labs, imaging and endoscopic files.  Impression and Plan: [name] is a 89 y.o. male  with past medical history of small bowel Crohn's disease status post resection complicated by recurrent stricture on Humira, C. difficile colitis, BPH, coronary artery disease, atrial fibrillation, GERD, hypothyroidism, who presents for discussion of duodenal carcinoid and Crohn's disease.  The patient was recently found to have a well-differentiated neuroendocrine tumor.  We discussed in detail the risks of dissemination of this tumor, which given his level of differentiation is low but are still present.  We also discussed different treatment modalities (I have previously discussed this with Dr. Rush Landmark  as duodenal endoscopic mucosal resection is a possible treatment), as well as the potential risks given his age, anticoagulation status and comorbidities.  After a thorough discussion with the patient and family, the patient decided he would like to hold off on any aggressive treatment for now but to monitor with regular endoscopies.  We discussed the need to perform a CT of the abdomen and pelvis with IV contrast to determine if he has any local spread of the disease and also checking his chromogranin level to obtain a baseline bowel movement.  The patient is supposed to have a repeat EGD in 2 months to determine if his esophagus has healed adequately, he will need to continue with PPI twice daily for now.  Finally, his Crohn's disease seems to be clinically controlled.  He was previously achieved with Humira.  We discussed again the importance of using the medication to control his disease but I explained in detail that we should use a less systemic medication such as Stelara as this may possibly lead to less systemic effects and widespread  disease.  -Chromogranin A level -Schedule CT abdomen/pelvis with IV contrast -Proceed with repeat EGD in 2 months, as previously recommended -Start Stelara infusion on Monday -Continue Pantoprazole 40 mg BID  All questions were answered.      Anthony Peppers, MD Gastroenterology and Hepatology [facility]

## 2022-10-26 NOTE — Telephone Encounter (Signed)
Per patient the infusion has been rescheduled to 10/30/2022. I advised that I would submit Stelara 90 mg SQ to specialty pharmacy after the infusion and see what co pay will be and if unaffordable we will submit for patient assistance with [employer] Overall. Patient and family aware we will get process started after infusion, and we have eight weeks to get approved for the medication,before due for the SQ dose.

## 2022-10-26 NOTE — Patient Instructions (Addendum)
Perform blood workup Schedule CT abdomen/pelvis with IV contrast Proceed with repeat EGD in 2 months, as previously recommended Start Stelara infusion on Monday

## 2022-10-27 ENCOUNTER — Telehealth: Payer: Self-pay | Admitting: *Deleted

## 2022-10-27 NOTE — Telephone Encounter (Signed)
Pt has been scheduled for a tele visit, 11/01/22 3:00.  Consent on file / medications reconciled.    Patient Consent for Virtual Visit        [name] has provided verbal consent on 10/27/2022 for a virtual visit (video or telephone).   CONSENT FOR VIRTUAL VISIT FOR:  [name]  By participating in this virtual visit I agree to the following:  I hereby voluntarily request, consent and authorize [facility] and its employed or contracted physicians, physician assistants, nurse practitioners or other licensed health care professionals (the Practitioner), to provide me with telemedicine health care services (the "Services") as deemed necessary by the treating Practitioner. I acknowledge and consent to receive the Services by the Practitioner via telemedicine. I understand that the telemedicine visit will involve communicating with the Practitioner through live audiovisual communication technology and the disclosure of certain medical information by electronic transmission. I acknowledge that I have been given the opportunity to request an in-person assessment or other available alternative prior to the telemedicine visit and am voluntarily participating in the telemedicine visit.  I understand that I have the right to withhold or withdraw my consent to the use of telemedicine in the course of my care at any time, without affecting my right to future care or treatment, and that the Practitioner or I may terminate the telemedicine visit at any time. I understand that I have the right to inspect all information obtained and/or recorded in the course of the telemedicine visit and may receive copies of available information for a reasonable fee.  I understand that some of the potential risks of receiving the Services via telemedicine include:  Delay or interruption in medical evaluation due to technological equipment failure or disruption; Information transmitted may not be sufficient  (e.g. poor resolution of images) to allow for appropriate medical decision making by the Practitioner; and/or  In rare instances, security protocols could fail, causing a breach of personal health information.  Furthermore, I acknowledge that it is my responsibility to provide information about my medical history, conditions and care that is complete and accurate to the best of my ability. I acknowledge that Practitioner's advice, recommendations, and/or decision may be based on factors not within their control, such as incomplete or inaccurate data provided by me or distortions of diagnostic images or specimens that may result from electronic transmissions. I understand that the practice of medicine is not an exact science and that Practitioner makes no warranties or guarantees regarding treatment outcomes. I acknowledge that a copy of this consent can be made available to me via my patient portal ([facility]), or I can request a printed copy by calling the office of [facility].    I understand that my insurance will be billed for this visit.   I have read or had this consent read to me. I understand the contents of this consent, which adequately explains the benefits and risks of the Services being provided via telemedicine.  I have been provided ample opportunity to ask questions regarding this consent and the Services and have had my questions answered to my satisfaction. I give my informed consent for the services to be provided through the use of telemedicine in my medical care

## 2022-10-27 NOTE — Telephone Encounter (Signed)
Pt has been scheduled for a tele visit, 11/01/22 3:00.  Consent on file / medications reconciled.

## 2022-10-27 NOTE — Telephone Encounter (Signed)
Pt informed that CT is scheduled for 11/17/22 at 11 am,arrive at 10:45 am at [facility], nothing to eat or drink 4 hours prior. Verbalized understanding.

## 2022-10-27 NOTE — Telephone Encounter (Signed)
Primary Cardiologist:Samuel Domenic Polite, MD   Preoperative team, please contact this patient and set up a phone call appointment for further preoperative risk assessment. Please obtain consent and complete medication review. Thank you for your help.   I confirm that guidance regarding antiplatelet and oral anticoagulation therapy has been completed and, if necessary, noted below.   Emmaline Life, NP-C  10/27/2022, 9:40 AM [address] Office [phone] Fax [phone]

## 2022-10-27 NOTE — Telephone Encounter (Signed)
Patient with diagnosis of atrial fibriliation on warfarin for anticoagulation.    Procedure: Esophagogastroduodenoscopy   Date of procedure: TBD   CHA2DS2-VASc Score = 4   This indicates a 4.8% annual risk of stroke. The patient's score is based upon: CHF History: 1 HTN History: 0 Diabetes History: 0 Stroke History: 0 Vascular Disease History: 1 Age Score: 2 Gender Score: 0   CrCl 31 ml/min (SrCr 1.56 10/16/2022) Platelet count 276 (10/16/2022)  Per office protocol, patient can hold warfarin  for 5 days prior to procedure.   Patient will not need bridging with Lovenox (enoxaparin) around procedure.  **This guidance is not considered finalized until pre-operative APP has relayed final recommendations.**

## 2022-10-29 ENCOUNTER — Encounter (HOSPITAL_COMMUNITY): Payer: Self-pay | Admitting: Emergency Medicine

## 2022-10-29 ENCOUNTER — Emergency Department (HOSPITAL_COMMUNITY): Payer: PPO

## 2022-10-29 ENCOUNTER — Emergency Department (HOSPITAL_COMMUNITY)
Admission: EM | Admit: 2022-10-29 | Discharge: 2022-10-29 | Disposition: A | Discharge status: Home / Self Care | Payer: PPO | Attending: Emergency Medicine | Admitting: Emergency Medicine

## 2022-10-29 ENCOUNTER — Other Ambulatory Visit: Payer: Self-pay

## 2022-10-29 DIAGNOSIS — K567 Ileus, unspecified: Secondary | ICD-10-CM | POA: Insufficient documentation

## 2022-10-29 DIAGNOSIS — Z79899 Other long term (current) drug therapy: Secondary | ICD-10-CM | POA: Diagnosis not present

## 2022-10-29 DIAGNOSIS — I700 Atherosclerosis of aorta: Secondary | ICD-10-CM | POA: Diagnosis not present

## 2022-10-29 DIAGNOSIS — R1111 Vomiting without nausea: Secondary | ICD-10-CM

## 2022-10-29 DIAGNOSIS — Z7901 Long term (current) use of anticoagulants: Secondary | ICD-10-CM | POA: Diagnosis not present

## 2022-10-29 DIAGNOSIS — I10 Essential (primary) hypertension: Secondary | ICD-10-CM | POA: Diagnosis not present

## 2022-10-29 DIAGNOSIS — R000 Tachycardia, unspecified: Secondary | ICD-10-CM | POA: Diagnosis not present

## 2022-10-29 DIAGNOSIS — Z1152 Encounter for screening for COVID-19: Secondary | ICD-10-CM | POA: Diagnosis not present

## 2022-10-29 DIAGNOSIS — R1110 Vomiting, unspecified: Secondary | ICD-10-CM | POA: Diagnosis present

## 2022-10-29 DIAGNOSIS — E039 Hypothyroidism, unspecified: Secondary | ICD-10-CM | POA: Insufficient documentation

## 2022-10-29 DIAGNOSIS — R109 Unspecified abdominal pain: Secondary | ICD-10-CM | POA: Diagnosis not present

## 2022-10-29 DIAGNOSIS — R112 Nausea with vomiting, unspecified: Secondary | ICD-10-CM | POA: Diagnosis not present

## 2022-10-29 LAB — CBC WITH DIFFERENTIAL/PLATELET
Abs Immature Granulocytes: 0.03 10*3/uL (ref 0.00–0.07)
Basophils Absolute: 0.1 10*3/uL (ref 0.0–0.1)
Basophils Relative: 1 %
Eosinophils Absolute: 0.2 10*3/uL (ref 0.0–0.5)
Eosinophils Relative: 2 %
HCT: 33.3 % — ABNORMAL LOW (ref 39.0–52.0)
Hemoglobin: 10.8 g/dL — ABNORMAL LOW (ref 13.0–17.0)
Immature Granulocytes: 0 %
Lymphocytes Relative: 20 %
Lymphs Abs: 1.7 10*3/uL (ref 0.7–4.0)
MCH: 30.9 pg (ref 26.0–34.0)
MCHC: 32.4 g/dL (ref 30.0–36.0)
MCV: 95.4 fL (ref 80.0–100.0)
Monocytes Absolute: 0.6 10*3/uL (ref 0.1–1.0)
Monocytes Relative: 6 %
Neutro Abs: 6.2 10*3/uL (ref 1.7–7.7)
Neutrophils Relative %: 71 %
Platelets: 251 10*3/uL (ref 150–400)
RBC: 3.49 MIL/uL — ABNORMAL LOW (ref 4.22–5.81)
RDW: 15.6 % — ABNORMAL HIGH (ref 11.5–15.5)
WBC: 8.8 10*3/uL (ref 4.0–10.5)
nRBC: 0 % (ref 0.0–0.2)

## 2022-10-29 LAB — TYPE AND SCREEN
ABO/RH(D): A POS
Antibody Screen: NEGATIVE

## 2022-10-29 LAB — RESP PANEL BY RT-PCR (RSV, FLU A&B, COVID)  RVPGX2
Influenza A by PCR: NEGATIVE
Influenza B by PCR: NEGATIVE
Resp Syncytial Virus by PCR: NEGATIVE
SARS Coronavirus 2 by RT PCR: NEGATIVE

## 2022-10-29 LAB — COMPREHENSIVE METABOLIC PANEL
ALT: 14 U/L (ref 0–44)
AST: 23 U/L (ref 15–41)
Albumin: 3.4 g/dL — ABNORMAL LOW (ref 3.5–5.0)
Alkaline Phosphatase: 65 U/L (ref 38–126)
Anion gap: 9 (ref 5–15)
BUN: 14 mg/dL (ref 8–23)
CO2: 26 mmol/L (ref 22–32)
Calcium: 8.8 mg/dL — ABNORMAL LOW (ref 8.9–10.3)
Chloride: 103 mmol/L (ref 98–111)
Creatinine, Ser: 1.32 mg/dL — ABNORMAL HIGH (ref 0.61–1.24)
GFR, Estimated: 52 mL/min — ABNORMAL LOW (ref >60)
Glucose, Bld: 129 mg/dL — ABNORMAL HIGH (ref 70–99)
Potassium: 3.6 mmol/L (ref 3.5–5.1)
Sodium: 138 mmol/L (ref 135–145)
Total Bilirubin: 0.8 mg/dL (ref 0.3–1.2)
Total Protein: 6.9 g/dL (ref 6.5–8.1)

## 2022-10-29 LAB — LIPASE, BLOOD: Lipase: 36 U/L (ref 11–51)

## 2022-10-29 MED ORDER — ONDANSETRON HCL 4 MG PO TABS: 4.0000 mg | ORAL_TABLET | Freq: Four times a day (QID) | ORAL | 0 refills | Status: DC

## 2022-10-29 MED ORDER — IOHEXOL 300 MG/ML  SOLN
100.0000 mL | Freq: Once | INTRAMUSCULAR | Status: AC | PRN
  Administered 2022-10-29: 100 mL via INTRAVENOUS

## 2022-10-29 MED ORDER — ONDANSETRON 4 MG PO TBDP: 4.0000 mg | ORAL_TABLET | Freq: Once | ORAL | Status: DC | PRN

## 2022-10-29 MED ORDER — SODIUM CHLORIDE 0.9 % IV SOLN: INTRAVENOUS | Status: DC

## 2022-10-29 NOTE — Discharge Instructions (Signed)
We have offered you admission to the hospital but you have declined, I think this is reasonable as long as you come back if things get worse.  You may take Zofran every 6 hours as needed for nausea, drink plenty of clear liquids small amounts at a time, after 24 hours you may advance your diet into other soft foods.  I do want you to follow-up with Dr. Jenetta Downer this week, I have sent him a personal message to let them know that you were in the ER and for the reasons that you were here.  Please return to the ER at any time should you change your mind or if your symptoms worsen.

## 2022-10-29 NOTE — ED Triage Notes (Addendum)
Pt from home. C/o of emesis that started today. Unable to keep anything down. Pain in lower abdomen. Denies fever/diarrhea. NAD

## 2022-10-29 NOTE — ED Provider Notes (Signed)
[facility] EMERGENCY DEPARTMENT Provider Note   CSN: [id number] Arrival date & time: 10/29/22  1955     History  Chief Complaint  Patient presents with   Emesis    [name] is a 89 y.o. male.   Emesis  This patient is an 89 year old male, history of hypertension on carvedilol, history of hypothyroidism on levothyroxine, history of atrial fibrillation on warfarin he had a history of Crohn's disease which she has had for many many years.  Unfortunately this patient was admitted in December and over the course of about 10 days in the hospital the patient spent much of that time in the ICU because of multifocal pneumonia and gastrointestinal bleeding.  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  The patient has followed up with GI in the office, he had some stricture related to his Crohn's disease as well.  He presents today stating that he has had a couple of days of worsening abdominal pain nausea and vomiting, he is not having any diarrhea, he is not able to hold anything down.  He has not had fevers or chills, he is not coughing or short of breath.  He is concerned because of his recent admission prolonged stay and rehab stay that there is something worse happening.    Home Medications Prior to Admission medications   Medication Sig Start Date End Date Taking? Authorizing Provider  ondansetron (ZOFRAN) 4 MG tablet Take 1 tablet (4 mg total) by mouth every 6 (six) hours. 10/29/22  Yes Noemi Chapel, MD  acetaminophen (TYLENOL) 500 MG tablet Take 500 mg by mouth every 6 (six) hours as needed for mild pain or moderate pain.    [provider]  carvedilol (COREG) 12.5 MG tablet Take 1 tablet (12.5 mg total) by mouth 2 (two) times daily. 10/10/22 11/09/22  Manuella Ghazi, Pratik D, DO  Cholecalciferol (VITAMIN D3) 50  MCG (2000 UT) TABS Take 1 tablet by mouth daily.    [provider]  diltiazem (CARDIZEM CD) 180 MG 24 hr capsule Take 180 mg by mouth 2 (two) times daily.    [provider]  ferrous sulfate (FEROSUL) 325 (65 FE) MG tablet Take 325 mg by mouth daily with breakfast.    [provider]  levothyroxine (SYNTHROID, LEVOTHROID) 75 MCG tablet Take 75 mcg by mouth daily before breakfast.    [provider]  MAGNESIUM GLUCONATE PO Take 400 mg by mouth 2 (two) times daily. 12/02/20   Rehman, Mechele Dawley, MD  melatonin 3 MG TABS tablet Take 2 tablets (6 mg total) by mouth at bedtime. 10/10/22 11/09/22  Manuella Ghazi, Pratik D, DO  nitroGLYCERIN (NITROSTAT) 0.4 MG SL tablet Place 0.4 mg under the tongue every 5 (five) minutes as needed. For chest pains. May repeat for up to 3 doses.    [provider]  OVER THE COUNTER MEDICATION Calcium '300mg'$  one twice a day    [provider]  pantoprazole (PROTONIX) 40 MG tablet Take 1 tablet (40 mg total) by mouth 2 (two) times daily. 10/10/22 11/09/22  Manuella Ghazi, Pratik D, DO  Probiotic Product (ALIGN) 4 MG CAPS Take by mouth in the morning.    [provider]  sucralfate (CARAFATE) 1 g tablet Take 1 tablet (1 g total) by mouth 4 (four) times daily. 10/10/22 11/09/22  Heath Lark D, DO  traZODone (DESYREL) 50 MG tablet Take 50 mg by mouth at bedtime.    [provider]  warfarin (COUMADIN) 2.5 MG tablet TAKE 1-1 &1/2 TABLETS BY MOUTH DAILY AS DIRECTED 08/14/22   Satira Sark, MD      Allergies    Patient has no known allergies.    Review of Systems   Review of Systems  Gastrointestinal:  Positive for vomiting.  All other systems reviewed and are negative.   Physical Exam Updated Vital Signs BP 122/65   Pulse 83   Temp 98.3 F (36.8 C) (Oral)   Resp 16   Ht 1.727 m ('5\' 8"'$ )   Wt 68 kg   SpO2 99%   BMI 22.81 kg/m  Physical Exam Vitals and nursing note reviewed.  Constitutional:      General: He  is not in acute distress.    Appearance: He is well-developed.  HENT:     Head: Normocephalic and atraumatic.     Mouth/Throat:     Pharynx: No oropharyngeal exudate.  Eyes:     General: No scleral icterus.       Right eye: No discharge.        Left eye: No discharge.     Conjunctiva/sclera: Conjunctivae normal.     Pupils: Pupils are equal, round, and reactive to light.  Neck:     Thyroid: No thyromegaly.     Vascular: No JVD.  Cardiovascular:     Rate and Rhythm: Regular rhythm. Tachycardia present.     Heart sounds: Normal heart sounds. No murmur heard.    No friction rub. No gallop.  Pulmonary:     Effort: Pulmonary effort is normal. No respiratory distress.     Breath sounds: Normal breath sounds. No wheezing or rales.  Abdominal:     General: Bowel sounds are normal. There is no distension.     Palpations: Abdomen is soft. There is no mass.     Tenderness: There is abdominal tenderness.     Comments: Mid abdomen is tender, there is no guarding or peritoneal signs  Musculoskeletal:        General: No tenderness. Normal range of motion.     Cervical back: Normal range of motion and neck supple.     Right lower leg: No edema.     Left lower leg: No edema.  Lymphadenopathy:     Cervical: No cervical adenopathy.  Skin:    General: Skin is warm and dry.     Findings: No erythema or rash.  Neurological:     Mental Status: He is alert.     Coordination: Coordination normal.  Psychiatric:        Behavior: Behavior normal.     ED Results / Procedures / Treatments   Labs (all labs ordered are listed, but only abnormal results are displayed) Labs Reviewed  COMPREHENSIVE METABOLIC PANEL - Abnormal; Notable for the following components:      Result Value   Glucose, Bld 129 (*)    Creatinine, Ser 1.32 (*)    Calcium 8.8 (*)    Albumin 3.4 (*)    GFR, Estimated 52 (*)    All other components within normal limits  CBC WITH DIFFERENTIAL/PLATELET - Abnormal; Notable for  the following components:   RBC 3.49 (*)    Hemoglobin 10.8 (*)    HCT 33.3 (*)    RDW 15.6 (*)    All other components within normal limits  RESP PANEL BY RT-PCR (RSV, FLU  A&B, COVID)  RVPGX2  LIPASE, BLOOD  URINALYSIS, ROUTINE W REFLEX MICROSCOPIC  POC OCCULT BLOOD, ED  TYPE AND SCREEN    EKG EKG Interpretation  Date/Time:  Sunday October 29 2022 21:42:50 EST Ventricular Rate:  99 PR Interval:    QRS Duration: 88 QT Interval:  351 QTC Calculation: 427 R Axis:   19 Text Interpretation: Atrial fibrillation Anterior infarct, old Confirmed by Noemi Chapel [phone]) on 10/29/2022 10:58:29 PM  Radiology CT ABDOMEN PELVIS W CONTRAST  Result Date: 10/29/2022 CLINICAL DATA:  Abdominal pain, emesis EXAM: CT ABDOMEN AND PELVIS WITH CONTRAST TECHNIQUE: Multidetector CT imaging of the abdomen and pelvis was performed using the standard protocol following bolus administration of intravenous contrast. RADIATION DOSE REDUCTION: This exam was performed according to the departmental dose-optimization program which includes automated exposure control, adjustment of the mA and/or kV according to patient size and/or use of iterative reconstruction technique. CONTRAST:  158m OMNIPAQUE IOHEXOL 300 MG/ML  SOLN COMPARISON:  04/11/2022 FINDINGS: Lower chest: Patchy left lower lobe airspace disease may reflect pneumonia or aspiration. Trace bilateral pleural effusions. Hepatobiliary: No focal liver abnormality is seen. Status post cholecystectomy. No biliary dilatation. Pancreas: Unremarkable. No pancreatic ductal dilatation or surrounding inflammatory changes. Spleen: Stable splenic cyst. Otherwise normal appearance and size of the spleen. Adrenals/Urinary Tract: Bilateral renal cortical atrophy. Stable bilateral renal cysts do not require imaging follow-up. The adrenals and bladder are unremarkable. Stomach/Bowel: There is distension of the mid to distal jejunum and ileum. Jejunum measures up to 4.7 cm in  diameter near the site of a previous small bowel-small bowel anastomosis. Prominent mucosal enhancement throughout the distended loops of small bowel suggest underlying enteritis and ileus. No high-grade bowel obstruction at this time. Diverticulosis of the distal colon without diverticulitis. There is a small hiatal hernia. Rounded hyperdense material seen within the small bowel and colon consistent within digested pills. Vascular/Lymphatic: Aortic atherosclerosis. No enlarged abdominal or pelvic lymph nodes. Reproductive: Prostate is unremarkable. Other: No free fluid or free intraperitoneal gas. No abdominal wall hernia. Musculoskeletal: No acute or destructive bony lesions. Reconstructed images demonstrate no additional findings. IMPRESSION: 1. Distension of the mid to distal jejunum and ileum, with associated mucosal enhancement. Findings are consistent with enteritis and underlying ileus. No evidence of high-grade obstruction. 2. Patchy left lower lobe airspace disease may reflect aspiration or infection. 3. Trace bilateral pleural effusions. 4. Distal colonic diverticulosis without diverticulitis. 5.  Aortic Atherosclerosis (ICD10-I70.0). Electronically Signed   By: MRanda NgoM.D.   On: 10/29/2022 22:40    Procedures Procedures    Medications Ordered in ED Medications  ondansetron (ZOFRAN-ODT) disintegrating tablet 4 mg (has no administration in time range)  0.9 %  sodium chloride infusion ( Intravenous New Bag/Given 10/29/22 2159)  iohexol (OMNIPAQUE) 300 MG/ML solution 100 mL (100 mLs Intravenous Contrast Given 10/29/22 2207)    ED Course/ Medical Decision Making/ A&P                             Medical Decision Making Amount and/or Complexity of Data Reviewed Labs: ordered. Radiology: ordered. ECG/medicine tests: ordered.  Risk Prescription drug management.   This patient presents to the ED for concern of abdominal pain with nausea and vomiting, not tolerating blood pressure  medications, he is tachycardic, I would question whether he is bleeding internally, whether there is some type of perforation in the small bowel area after polypectomy or some other complicating factor.  He will need  IV fluids pain medicine a CT scan   Co morbidities that complicate the patient evaluation  Recent admit Crohn's disease   Additional history obtained:  Additional history obtained from patient's spouse and EMR External records from outside source obtained and reviewed including see the HPI...   Lab Tests:  I Ordered, and personally interpreted labs.  The pertinent results include: No leukocytosis, renal function is at baseline with a 1.3 creatinine of BUN of 14, normal electrolytes, CBC with an improving hemoglobin and a white blood cell count of 8800 COVID flu and RSV negative   Imaging Studies ordered:  I ordered imaging studies including CT scan of the abdomen and pelvis confirmed several things I independently visualized and interpreted imaging which showed the patient has trace bilateral pleural effusions, a patchy left lower lobe airspace disease, the patient states he is been completely treated for pneumonia and is no longer coughing, he did have a small episode of coughing after he vomited this evening but has not been coughing for the last 2 hours and does not feel short of breath, he is not hypoxic or tachycardic, the CT scan also showed a mid to distal jejunal ileum mucosal enhancement which appears to be an ileus and not a high-grade obstruction. I agree with the radiologist interpretation   Cardiac Monitoring: / EKG:  The patient was maintained on a cardiac monitor.  I personally viewed and interpreted the cardiac monitored which showed an underlying rhythm of: Normal sinus rhythm   Consultations Obtained:  I sent a in basket message to Dr. Jenetta Downer to follow-up with the patient   Problem List / ED Course / Critical interventions / Medication  management  The patient has what appears to be an ileus which is likely causing the nausea and vomiting however hemodynamically has stable lab work is remarkable for improving hemoglobin and there is no high risk features seen on either vital signs or lab workup.  There is no signs of perforation or obstruction on CT scan.  I offered the patient admission to the hospital but after getting IV fluids and antiemetics he feels better and does not want to be in the hospital around others who are sick, he understands that he may return at any time should he change his mind.  I think he is reasonable for discharge, he and his wife understand the indications for return  Social Determinants of Health:  Elderly   Test / Admission - Considered:  Considered admission but patient declines         Final Clinical Impression(s) / ED Diagnoses Final diagnoses:  Ileus ([place])  Vomiting without nausea, unspecified vomiting type    Rx / DC Orders ED Discharge Orders          Ordered    ondansetron (ZOFRAN) 4 MG tablet  Every 6 hours        10/29/22 2317              Noemi Chapel, MD 10/29/22 2325

## 2022-10-30 ENCOUNTER — Telehealth (INDEPENDENT_AMBULATORY_CARE_PROVIDER_SITE_OTHER): Payer: Self-pay

## 2022-10-30 ENCOUNTER — Ambulatory Visit (INDEPENDENT_AMBULATORY_CARE_PROVIDER_SITE_OTHER): Payer: PPO | Admitting: Gastroenterology

## 2022-10-30 ENCOUNTER — Ambulatory Visit (INDEPENDENT_AMBULATORY_CARE_PROVIDER_SITE_OTHER): Payer: PPO

## 2022-10-30 VITALS — BP 120/60 | HR 77 | Temp 98.4°F | Resp 18 | Ht 68.0 in | Wt 148.2 lb

## 2022-10-30 DIAGNOSIS — K50018 Crohn's disease of small intestine with other complication: Secondary | ICD-10-CM

## 2022-10-30 MED ORDER — USTEKINUMAB 130 MG/26ML IV SOLN
390.0000 mg | Freq: Once | INTRAVENOUS | Status: AC
  Administered 2022-10-30: 390 mg via INTRAVENOUS
  Filled 2022-10-30: qty 78

## 2022-10-30 NOTE — Telephone Encounter (Signed)
I spoke with the patient daughter Sharyn Lull and made her aware of Dr. Colman Cater recommendations. She will let me know what they decide.

## 2022-10-30 NOTE — Telephone Encounter (Signed)
I spoke with the patient and his daughter. They say they would rather not have the infusion today as he is weak from vomiting. He had a recent Ed visit last night due to nausea,vomiting , and weakness. He also wanted to know if he needs to cancel the Ct scan that is scheduled for 11/17/2022 as he had a Ct scan and EKG done at the Ed on 10/29/2022. Please advise, Do I need to cancel his appointment for today. He says the vomiting has subsided,but he is still weak. Please advise.   Anthony Quale, MD  [name], [place], Please ask Mr. Olivares to proceed with Stelara dose today. Thanks       Previous Messages    ----- Message ----- From: Noemi Chapel, MD Sent: 10/29/2022  11:22 PM EST To: Anthony Quale, MD Subject: f/u                                            A Daniel please see the visit from this evening, this patient had recently been admitted with a complicated history of pretty severe esophagitis, ended up having an upper gastrointestinal bleed had a biopsy of his duodenal polyp which ended up being some type of cancer and was in rehab, he came back in tonight with nausea and vomiting and was found to have an ileus of his jejunum, no signs of obstruction, nausea and vomiting improved.  He was supposed to be transitioned onto Stelara and have his first dose tomorrow afternoon, I told him I would touch base with you.  If you do not think he needs to take the Stelara tomorrow I will trust that he will reach out to the family.  I offered him admission to the hospital but he declined, thank you have a great night  Aaron Edelman

## 2022-10-30 NOTE — Progress Notes (Signed)
Diagnosis: Crohn's Disease  Provider:  Marshell Garfinkel MD  Procedure: Infusion  IV Type: Peripheral, IV Location: L Forearm  Stelera (Ustekinumab), Dose: 390 mg  Infusion Start Time: 1025  Infusion Stop Time: 1618  Post Infusion IV Care: Observation period completed and Peripheral IV Discontinued  Discharge: Condition: Good, Destination: Home . AVS provided to patient.   Performed by:  Arnoldo Morale, RN

## 2022-10-30 NOTE — Telephone Encounter (Signed)
Patient showed up at the infusion center today.

## 2022-10-30 NOTE — Telephone Encounter (Signed)
He can cancel the CT scan, no need to proceed with this anymore (Mindy FYI).  Please let him know that he should go ahead with the Stelara as soon as possible so his disease can be controlled as we are holding his Humira

## 2022-10-31 ENCOUNTER — Other Ambulatory Visit (INDEPENDENT_AMBULATORY_CARE_PROVIDER_SITE_OTHER): Payer: Self-pay

## 2022-10-31 ENCOUNTER — Telehealth (INDEPENDENT_AMBULATORY_CARE_PROVIDER_SITE_OTHER): Payer: Self-pay

## 2022-10-31 DIAGNOSIS — K50018 Crohn's disease of small intestine with other complication: Secondary | ICD-10-CM

## 2022-10-31 MED ORDER — USTEKINUMAB 90 MG/ML ~~LOC~~ SOSY: 90.0000 mg | PREFILLED_SYRINGE | SUBCUTANEOUS | 6 refills | Status: DC

## 2022-10-31 NOTE — Telephone Encounter (Signed)
Noted.  Appt cancelled.

## 2022-10-31 NOTE — Telephone Encounter (Signed)
Hey Mindy, would you please cancel upcoming CT scan per Dr. Jenetta Downer at [facility] on 11/17/2022. Thanks

## 2022-10-31 NOTE — Telephone Encounter (Signed)
I sent the subcutaneous injections to [facility] and I have sent an authorization to patient plan.

## 2022-10-31 NOTE — Telephone Encounter (Signed)
Authorization was approved from 10/31/2022 - 05/01/2023.  Patient made aware insurance approved SQ doses and that [facility] may reach out to him to set up shipment. They will let him know cost and may ask him if he needs financial assistance. I have reminder set for 04/2023 to get re authorized if not on a pap.

## 2022-11-01 ENCOUNTER — Ambulatory Visit: Payer: PPO | Attending: Cardiovascular Disease | Admitting: Nurse Practitioner

## 2022-11-01 ENCOUNTER — Telehealth (INDEPENDENT_AMBULATORY_CARE_PROVIDER_SITE_OTHER): Payer: Self-pay

## 2022-11-01 ENCOUNTER — Ambulatory Visit (INDEPENDENT_AMBULATORY_CARE_PROVIDER_SITE_OTHER): Payer: PPO | Admitting: *Deleted

## 2022-11-01 DIAGNOSIS — I4891 Unspecified atrial fibrillation: Secondary | ICD-10-CM

## 2022-11-01 DIAGNOSIS — Z5181 Encounter for therapeutic drug level monitoring: Secondary | ICD-10-CM

## 2022-11-01 DIAGNOSIS — Z01810 Encounter for preprocedural cardiovascular examination: Secondary | ICD-10-CM

## 2022-11-01 LAB — POCT INR: INR: 3.6 — AB (ref 2.0–3.0)

## 2022-11-01 LAB — CHROMOGRANIN A: Chromogranin A (ng/mL): 1329 ng/mL — ABNORMAL HIGH (ref 0.0–101.8)

## 2022-11-01 NOTE — Telephone Encounter (Signed)
Patient called today states, they coumadin clinic called him today to do a clearance for upcoming EGD due in March. Per Np Dick with Coumadin clinic he suggest to have another visit with them prior to the Egd in two months.

## 2022-11-01 NOTE — Patient Instructions (Signed)
Has been taking warfarin 2.'5mg'$  daily since discharge from [facility] warfarin tonight then decrease dose to 1 tablet daily except 1/2 tablet on Thursdays Recheck in 2 weeks

## 2022-11-01 NOTE — Telephone Encounter (Signed)
I made the patient wife aware that our office will be reaching out to the coumadin clinic/Cardiology at least a week prior to the procedure.

## 2022-11-01 NOTE — Telephone Encounter (Signed)
Faxed patient records to [facility] atten: Sherren Mocha.

## 2022-11-01 NOTE — Telephone Encounter (Signed)
Can we please ask them to set him up for a follow up appointment with the coumadin clinic at least a week prior to his procedure? Thanks

## 2022-11-01 NOTE — Progress Notes (Signed)
[name] was contacted today for preoperative clearance appointment and stated that his procedure will more than likely be scheduled in 3 months from today.  I advised him to submit a new request when closer to the procedure date and he was given a no charge for today's visit.

## 2022-11-02 DIAGNOSIS — R2681 Unsteadiness on feet: Secondary | ICD-10-CM | POA: Diagnosis not present

## 2022-11-02 DIAGNOSIS — M6281 Muscle weakness (generalized): Secondary | ICD-10-CM | POA: Diagnosis not present

## 2022-11-02 DIAGNOSIS — Z9181 History of falling: Secondary | ICD-10-CM | POA: Diagnosis not present

## 2022-11-02 NOTE — Telephone Encounter (Signed)
Tammy, do you have this patient's encounter form?

## 2022-11-02 NOTE — Telephone Encounter (Signed)
I do.

## 2022-11-03 ENCOUNTER — Ambulatory Visit: Payer: PPO

## 2022-11-06 ENCOUNTER — Encounter: Payer: Self-pay | Admitting: Gastroenterology

## 2022-11-06 DIAGNOSIS — R2681 Unsteadiness on feet: Secondary | ICD-10-CM | POA: Diagnosis not present

## 2022-11-06 DIAGNOSIS — Z9181 History of falling: Secondary | ICD-10-CM | POA: Diagnosis not present

## 2022-11-06 DIAGNOSIS — M6281 Muscle weakness (generalized): Secondary | ICD-10-CM | POA: Diagnosis not present

## 2022-11-07 NOTE — Telephone Encounter (Signed)
Patient assistance forms filled out, awaiting Dr. Serena Croissant.

## 2022-11-07 NOTE — Telephone Encounter (Signed)
Per Herschell Dimes with [facility] the patient co payment for Delsa Grana is [phone] for a 8 week supply.

## 2022-11-08 DIAGNOSIS — R2681 Unsteadiness on feet: Secondary | ICD-10-CM | POA: Diagnosis not present

## 2022-11-08 DIAGNOSIS — Z9181 History of falling: Secondary | ICD-10-CM | POA: Diagnosis not present

## 2022-11-08 DIAGNOSIS — M6281 Muscle weakness (generalized): Secondary | ICD-10-CM | POA: Diagnosis not present

## 2022-11-08 NOTE — Telephone Encounter (Signed)
I spoke with the patient and he was made aware of his co payment. I advised we were working on the patient assistance forms and would keep him updated. Patient states understanding.

## 2022-11-10 NOTE — Telephone Encounter (Signed)
Faxed the patient information to [facility] patient assistance.

## 2022-11-13 DIAGNOSIS — Z9181 History of falling: Secondary | ICD-10-CM | POA: Diagnosis not present

## 2022-11-13 DIAGNOSIS — M6281 Muscle weakness (generalized): Secondary | ICD-10-CM | POA: Diagnosis not present

## 2022-11-13 DIAGNOSIS — R2681 Unsteadiness on feet: Secondary | ICD-10-CM | POA: Diagnosis not present

## 2022-11-13 NOTE — Telephone Encounter (Signed)
Patient aware he will need to come by the office to sign PAP forms for [employer].

## 2022-11-15 ENCOUNTER — Ambulatory Visit: Payer: PPO | Attending: Cardiology | Admitting: *Deleted

## 2022-11-15 DIAGNOSIS — E7849 Other hyperlipidemia: Secondary | ICD-10-CM | POA: Diagnosis not present

## 2022-11-15 DIAGNOSIS — Z5181 Encounter for therapeutic drug level monitoring: Secondary | ICD-10-CM | POA: Diagnosis not present

## 2022-11-15 DIAGNOSIS — E039 Hypothyroidism, unspecified: Secondary | ICD-10-CM | POA: Diagnosis not present

## 2022-11-15 DIAGNOSIS — N189 Chronic kidney disease, unspecified: Secondary | ICD-10-CM | POA: Diagnosis not present

## 2022-11-15 DIAGNOSIS — E7801 Familial hypercholesterolemia: Secondary | ICD-10-CM | POA: Diagnosis not present

## 2022-11-15 DIAGNOSIS — I10 Essential (primary) hypertension: Secondary | ICD-10-CM | POA: Diagnosis not present

## 2022-11-15 DIAGNOSIS — I4891 Unspecified atrial fibrillation: Secondary | ICD-10-CM | POA: Diagnosis not present

## 2022-11-15 DIAGNOSIS — K2100 Gastro-esophageal reflux disease with esophagitis, without bleeding: Secondary | ICD-10-CM | POA: Diagnosis not present

## 2022-11-15 LAB — POCT INR: INR: 2.1 (ref 2.0–3.0)

## 2022-11-15 NOTE — Patient Instructions (Signed)
Continue warfarin 1 tablet daily except 1/2 tablet on Thursdays Recheck in 3 weeks

## 2022-11-16 ENCOUNTER — Other Ambulatory Visit (INDEPENDENT_AMBULATORY_CARE_PROVIDER_SITE_OTHER): Payer: Self-pay | Admitting: Gastroenterology

## 2022-11-16 NOTE — Telephone Encounter (Signed)
Thank you :)

## 2022-11-16 NOTE — Telephone Encounter (Signed)
Per Alph[employer]a Overall the patient is approved for free Stelara for a year from 11/16/2022- 11/17/2023. Patient made aware, He is aware he is due for the SQ dose on 12/25/2022.He was signed up for the nurse ambassador program with Alphonsa Overall so the nurse will come out and show patient how to administer.

## 2022-11-16 NOTE — Telephone Encounter (Signed)
No more refills are needed for this, this was only a 1 month prescription

## 2022-11-17 ENCOUNTER — Ambulatory Visit (HOSPITAL_COMMUNITY): Payer: PPO

## 2022-11-19 DIAGNOSIS — K5090 Crohn's disease, unspecified, without complications: Secondary | ICD-10-CM | POA: Diagnosis not present

## 2022-11-19 DIAGNOSIS — D3A00 Benign carcinoid tumor of unspecified site: Secondary | ICD-10-CM | POA: Diagnosis not present

## 2022-11-19 DIAGNOSIS — D62 Acute posthemorrhagic anemia: Secondary | ICD-10-CM | POA: Diagnosis not present

## 2022-11-19 DIAGNOSIS — G4700 Insomnia, unspecified: Secondary | ICD-10-CM | POA: Diagnosis not present

## 2022-11-19 DIAGNOSIS — Z6822 Body mass index (BMI) 22.0-22.9, adult: Secondary | ICD-10-CM | POA: Diagnosis not present

## 2022-11-19 DIAGNOSIS — J189 Pneumonia, unspecified organism: Secondary | ICD-10-CM | POA: Diagnosis not present

## 2022-11-20 DIAGNOSIS — M6281 Muscle weakness (generalized): Secondary | ICD-10-CM | POA: Diagnosis not present

## 2022-11-20 DIAGNOSIS — R2681 Unsteadiness on feet: Secondary | ICD-10-CM | POA: Diagnosis not present

## 2022-11-20 DIAGNOSIS — Z9181 History of falling: Secondary | ICD-10-CM | POA: Diagnosis not present

## 2022-11-22 DIAGNOSIS — G4709 Other insomnia: Secondary | ICD-10-CM | POA: Diagnosis not present

## 2022-11-22 DIAGNOSIS — E7801 Familial hypercholesterolemia: Secondary | ICD-10-CM | POA: Diagnosis not present

## 2022-11-22 DIAGNOSIS — R42 Dizziness and giddiness: Secondary | ICD-10-CM | POA: Diagnosis not present

## 2022-11-22 DIAGNOSIS — K56600 Partial intestinal obstruction, unspecified as to cause: Secondary | ICD-10-CM | POA: Diagnosis not present

## 2022-11-22 DIAGNOSIS — N1330 Unspecified hydronephrosis: Secondary | ICD-10-CM | POA: Diagnosis not present

## 2022-11-22 DIAGNOSIS — I10 Essential (primary) hypertension: Secondary | ICD-10-CM | POA: Diagnosis not present

## 2022-11-22 DIAGNOSIS — E039 Hypothyroidism, unspecified: Secondary | ICD-10-CM | POA: Diagnosis not present

## 2022-11-22 DIAGNOSIS — D649 Anemia, unspecified: Secondary | ICD-10-CM | POA: Diagnosis not present

## 2022-11-22 DIAGNOSIS — R7301 Impaired fasting glucose: Secondary | ICD-10-CM | POA: Diagnosis not present

## 2022-11-22 DIAGNOSIS — Z23 Encounter for immunization: Secondary | ICD-10-CM | POA: Diagnosis not present

## 2022-11-22 DIAGNOSIS — J189 Pneumonia, unspecified organism: Secondary | ICD-10-CM | POA: Diagnosis not present

## 2022-11-23 ENCOUNTER — Encounter: Payer: Self-pay | Admitting: Cardiology

## 2022-11-23 ENCOUNTER — Ambulatory Visit: Payer: PPO | Attending: Cardiology | Admitting: Cardiology

## 2022-11-23 VITALS — BP 122/78 | HR 86 | Ht 68.0 in | Wt 156.0 lb

## 2022-11-23 DIAGNOSIS — I4821 Permanent atrial fibrillation: Secondary | ICD-10-CM

## 2022-11-23 NOTE — Progress Notes (Signed)
Cardiology Office Note  Date: 11/23/2022   ID: [name], DOB May 13, 1931, MRN [id number]  PCP:  Manon Hilding, MD  Cardiologist:  Rozann Lesches, MD Electrophysiologist:  None   Chief Complaint  Patient presents with   Cardiac follow-up    History of Present Illness: [name] is an 89 y.o. male last seen in July 2023.  He is here with his wife for a follow-up visit.  Had a fairly eventful December and January.  Was admitted with multifocal pneumonia and rapid atrial fibrillation, subsequently acute blood loss anemia due to GI bleeding.  He underwent inpatient rehabilitation thereafter and is back at home doing outpatient PT.  At the present time follow-up EGD is planned per Dr. Jenetta Downer sometime in March.  He continues on Coumadin with follow-up in the anticoagulation clinic.  INR was 2.1 on January 31.  He does not report any obvious recurrent bleeding issues and otherwise remains on Coreg and Cardizem CD for heart rate control.  He reports no chest pain or palpitations.  Gradually increasing his activity, using a rolling walker.  Has had some leg edema that has not been progressive.  Echocardiogram from December 2023 revealed LVEF 50 to 55% range.   Past Medical History:  Diagnosis Date   Anemia    Atrial fibrillation (HCC)    BPH (benign prostatic hyperplasia)    CAD (coronary artery disease)    Catheterization 2004, mild/moderate nonobstructive disease  /   nuclear, 2007, small inferior scar // no ischemia   Crohn's disease ([place])    Elevated PSA    GERD (gastroesophageal reflux disease)    History of kidney stones    History of pneumonia    Hypothyroidism    Mitral regurgitation    SBO (small bowel obstruction) ([place])    Urinary retention    Wears glasses     Current Outpatient Medications  Medication Sig Dispense Refill   acetaminophen (TYLENOL) 500 MG tablet Take 500 mg by mouth every 6 (six) hours as needed for mild pain or moderate pain.     carvedilol  (COREG) 12.5 MG tablet Take 1 tablet (12.5 mg total) by mouth 2 (two) times daily. 60 tablet 0   Cholecalciferol (VITAMIN D3) 50 MCG (2000 UT) TABS Take 1 tablet by mouth daily.     diltiazem (CARDIZEM CD) 180 MG 24 hr capsule Take 180 mg by mouth 2 (two) times daily.     ferrous sulfate (FEROSUL) 325 (65 FE) MG tablet Take 325 mg by mouth daily with breakfast.     levothyroxine (SYNTHROID, LEVOTHROID) 75 MCG tablet Take 75 mcg by mouth daily before breakfast.     MAGNESIUM GLUCONATE PO Take 400 mg by mouth 2 (two) times daily.     nitroGLYCERIN (NITROSTAT) 0.4 MG SL tablet Place 0.4 mg under the tongue every 5 (five) minutes as needed. For chest pains. May repeat for up to 3 doses.     OVER THE COUNTER MEDICATION Calcium '300mg'$  one twice a day     pantoprazole (PROTONIX) 40 MG tablet Take 1 tablet (40 mg total) by mouth 2 (two) times daily. 60 tablet 0   Probiotic Product (ALIGN) 4 MG CAPS Take by mouth in the morning.     traZODone (DESYREL) 50 MG tablet Take 50 mg by mouth at bedtime.     ustekinumab (STELARA) 90 MG/ML SOSY injection Inject 1 mL (90 mg total) into the skin every 8 (eight) weeks. 1 mL 6  warfarin (COUMADIN) 2.5 MG tablet TAKE 1-1 &1/2 TABLETS BY MOUTH DAILY AS DIRECTED 45 tablet 6   No current facility-administered medications for this visit.   Allergies:  Patient has no known allergies.   ROS: No falls or syncope.  Physical Exam: VS:  BP 122/78   Pulse 86   Ht '5\' 8"'$  (1.727 m)   Wt 156 lb (70.8 kg)   SpO2 98%   BMI 23.72 kg/m , BMI Body mass index is 23.72 kg/m.  Wt Readings from Last 3 Encounters:  11/23/22 156 lb (70.8 kg)  10/30/22 148 lb 3.2 oz (67.2 kg)  10/29/22 150 lb (68 kg)    General: Patient appears comfortable at rest. HEENT: Conjunctiva and lids normal. Lungs: Clear to auscultation, nonlabored breathing at rest. Cardiac: Irregularly irregular, 1/6 systolic murmur. Extremities: Mild, chronic appearing lower leg edema.  ECG:  An ECG dated  10/29/2022 was personally reviewed today and demonstrated:  Atrial fibrillation with PVCs versus aberrantly conducted complexes.  Recent Labwork: 05/28/2022: B Natriuretic Peptide 105.0 10/10/2022: Magnesium 1.4 10/29/2022: ALT 14; AST 23; BUN 14; Creatinine, Ser 1.32; Hemoglobin 10.8; Platelets 251; Potassium 3.6; Sodium 138   Other Studies Reviewed Today:  Echocardiogram 10/01/2022:  1. Left ventricular ejection fraction, by estimation, is 50 to 55%. The  left ventricle has low normal function. The left ventricle has no regional  wall motion abnormalities. Left ventricular diastolic parameters are  indeterminate.   2. Right ventricular systolic function is normal. The right ventricular  size is normal. There is normal pulmonary artery systolic pressure.   3. Large pleural effusion in the left lateral region.   4. The mitral valve is grossly normal. Mild mitral valve regurgitation.  No evidence of mitral stenosis.   5. The aortic valve is tricuspid. There is mild calcification of the  aortic valve. There is mild thickening of the aortic valve. Aortic valve  regurgitation is trivial. No aortic stenosis is present.   6. The inferior vena cava is dilated in size with >50% respiratory  variability, suggesting right atrial pressure of 8 mmHg.   Assessment and Plan:  1.  Permanent atrial fibrillation with CHA2DS2-VASc score of 3.  Heart rate is well-controlled at this time on Coreg and Cardizem CD.  He continues on Coumadin with follow-up in our anticoagulation clinic.  We will help guide temporary discontinuation of anticoagulation once firm EGD date in March is known.  I do not anticipate that he would need bridging with Lovenox.  2.  History of nonobstructive CAD, no active angina.  LVEF 50 to 55% as of evaluation in December 2023.  He has nitroglycerin available, has not required any use.  Medication Adjustments/Labs and Tests Ordered: Current medicines are reviewed at length with the  patient today.  Concerns regarding medicines are outlined above.   Tests Ordered: No orders of the defined types were placed in this encounter.   Medication Changes: No orders of the defined types were placed in this encounter.   Disposition:  Follow up  6 months.  Signed, Satira Sark, MD, [facility] 11/23/2022 4:24 PM    [facility] [address], [place], [place] [address] Phone: [phone]; Fax: [phone]

## 2022-11-23 NOTE — Patient Instructions (Addendum)

## 2022-11-24 DIAGNOSIS — Z9181 History of falling: Secondary | ICD-10-CM | POA: Diagnosis not present

## 2022-11-24 DIAGNOSIS — M6281 Muscle weakness (generalized): Secondary | ICD-10-CM | POA: Diagnosis not present

## 2022-11-24 DIAGNOSIS — R2681 Unsteadiness on feet: Secondary | ICD-10-CM | POA: Diagnosis not present

## 2022-11-28 DIAGNOSIS — Z9181 History of falling: Secondary | ICD-10-CM | POA: Diagnosis not present

## 2022-11-28 DIAGNOSIS — M6281 Muscle weakness (generalized): Secondary | ICD-10-CM | POA: Diagnosis not present

## 2022-11-28 DIAGNOSIS — R2681 Unsteadiness on feet: Secondary | ICD-10-CM | POA: Diagnosis not present

## 2022-11-28 NOTE — Progress Notes (Signed)
thanks

## 2022-11-30 DIAGNOSIS — M6281 Muscle weakness (generalized): Secondary | ICD-10-CM | POA: Diagnosis not present

## 2022-11-30 DIAGNOSIS — R2681 Unsteadiness on feet: Secondary | ICD-10-CM | POA: Diagnosis not present

## 2022-11-30 DIAGNOSIS — Z9181 History of falling: Secondary | ICD-10-CM | POA: Diagnosis not present

## 2022-12-04 ENCOUNTER — Encounter (INDEPENDENT_AMBULATORY_CARE_PROVIDER_SITE_OTHER): Payer: Self-pay | Admitting: *Deleted

## 2022-12-06 ENCOUNTER — Ambulatory Visit: Payer: PPO | Attending: Cardiology | Admitting: *Deleted

## 2022-12-06 DIAGNOSIS — Z5181 Encounter for therapeutic drug level monitoring: Secondary | ICD-10-CM

## 2022-12-06 DIAGNOSIS — I4891 Unspecified atrial fibrillation: Secondary | ICD-10-CM

## 2022-12-06 LAB — POCT INR: INR: 2.3 (ref 2.0–3.0)

## 2022-12-06 NOTE — Patient Instructions (Signed)
Continue warfarin 1 tablet daily except 1/2 tablet on Thursdays Recheck in 4 weeks

## 2022-12-08 DIAGNOSIS — R2681 Unsteadiness on feet: Secondary | ICD-10-CM | POA: Diagnosis not present

## 2022-12-08 DIAGNOSIS — Z9181 History of falling: Secondary | ICD-10-CM | POA: Diagnosis not present

## 2022-12-08 DIAGNOSIS — M6281 Muscle weakness (generalized): Secondary | ICD-10-CM | POA: Diagnosis not present

## 2022-12-11 ENCOUNTER — Telehealth (INDEPENDENT_AMBULATORY_CARE_PROVIDER_SITE_OTHER): Payer: Self-pay | Admitting: Gastroenterology

## 2022-12-11 NOTE — Telephone Encounter (Signed)
I received the results of the most recent blood workup performed on 11/15/2022 which showed CBC with WBC 7.0, hemoglobin 10.8, platelets 235, CMP with sodium 145, potassium 4.0, creatinine 1.4, BUN 12, AST 10, ALT 7, total bilirubin 0.6, alkaline phosphatase 49, TSH 3.6. Labs look stable.

## 2022-12-13 ENCOUNTER — Telehealth: Payer: Self-pay | Admitting: Cardiology

## 2022-12-13 DIAGNOSIS — E039 Hypothyroidism, unspecified: Secondary | ICD-10-CM | POA: Diagnosis not present

## 2022-12-13 DIAGNOSIS — I4891 Unspecified atrial fibrillation: Secondary | ICD-10-CM | POA: Diagnosis not present

## 2022-12-13 DIAGNOSIS — Z6823 Body mass index (BMI) 23.0-23.9, adult: Secondary | ICD-10-CM | POA: Diagnosis not present

## 2022-12-13 DIAGNOSIS — D649 Anemia, unspecified: Secondary | ICD-10-CM | POA: Diagnosis not present

## 2022-12-13 DIAGNOSIS — I2510 Atherosclerotic heart disease of native coronary artery without angina pectoris: Secondary | ICD-10-CM | POA: Diagnosis not present

## 2022-12-13 DIAGNOSIS — R609 Edema, unspecified: Secondary | ICD-10-CM | POA: Diagnosis not present

## 2022-12-13 NOTE — Telephone Encounter (Signed)
Patient states that he has had swelling in his legs, feet and ankles for the past month and when he saw his PCP today, was told that he needed to decrease diltiazem to 120 mg once a day. States that when he last saw Dr. Domenic Polite on 2/8, he mentioned that the swelling could be due to inactivity since being in the hospital for so many days. Patient denies pain or redness. Weight today was 153. States that his PCP instructed him to consult with his cardiologist. Please advise.

## 2022-12-13 NOTE — Telephone Encounter (Signed)
Patient made aware and verbalized understanding. Wants to know if he should continue continue taking 180 mg twice a day of diltiazem and what does Dr. Domenic Polite feel he needs to do about the swelling.

## 2022-12-13 NOTE — Telephone Encounter (Signed)
Pt c/o medication issue:  1. Name of Medication:   diltiazem (CARDIZEM CD) 180 MG 24 hr capsule    2. How are you currently taking this medication (dosage and times per day)? As prescribed   3. Are you having a reaction (difficulty breathing--STAT)?   4. What is your medication issue? Patient is requesting call back to discuss PCP wanting to reduce dose to 120 MG due to swelling. Please advise.

## 2022-12-14 ENCOUNTER — Encounter (INDEPENDENT_AMBULATORY_CARE_PROVIDER_SITE_OTHER): Payer: Self-pay | Admitting: Gastroenterology

## 2022-12-14 ENCOUNTER — Telehealth: Payer: Self-pay | Admitting: *Deleted

## 2022-12-14 ENCOUNTER — Other Ambulatory Visit: Payer: Self-pay | Admitting: *Deleted

## 2022-12-14 DIAGNOSIS — E340 Carcinoid syndrome: Secondary | ICD-10-CM

## 2022-12-14 MED ORDER — FUROSEMIDE 20 MG PO TABS: 20.0000 mg | ORAL_TABLET | ORAL | 0 refills | Status: DC | PRN

## 2022-12-14 NOTE — Telephone Encounter (Signed)
  Request for patient to stop medication prior to procedure or is needing cleareance  12/14/22  [name] [birth year]  What type of surgery is being performed? Esophagogastroduodenoscopy (EGD)   When is surgery scheduled? 01/02/23  What type of clearance is required (medical or pharmacy to hold medication or both? medication  Are there any medications that need to be held prior to surgery and how long? Warfarin x 5 days  Name of physician performing surgery?  [facility] Phone: [phone] Fax: [phone]  Anethesia type (none, local, MAC, general)? MAC

## 2022-12-14 NOTE — Telephone Encounter (Signed)
Pt has been scheduled for EGD on 01/02/23. I have sent message to the coumadin clinic to inform them of his procedure. I also rescheduled the CT scan for 12/22/22  because he refused the one that was scheduled in February. FYI

## 2022-12-14 NOTE — Telephone Encounter (Signed)
Thanks

## 2022-12-14 NOTE — Addendum Note (Signed)
Addended by: Madelin Rear on: 12/14/2022 09:57 AM   Modules accepted: Orders

## 2022-12-14 NOTE — Telephone Encounter (Signed)
Patient made aware, verbalized understanding. Prescription for lasix sent to [facility].

## 2022-12-14 NOTE — Telephone Encounter (Signed)
Pt has been scheduled for Esophagogastroduodenoscopy (EGD) on 01/02/23. He needs to hold warfarin x 5 days prior to procedure. Please advise. Thank you

## 2022-12-14 NOTE — Telephone Encounter (Signed)
[name], patient called in, do you still have him to schedule?

## 2022-12-14 NOTE — Telephone Encounter (Signed)
No PA required for CT scan per Harrell Gave T with [employer].

## 2022-12-14 NOTE — Telephone Encounter (Signed)
.  rga

## 2022-12-15 NOTE — Telephone Encounter (Signed)
   Patient Name: [name]  DOB: 1931-09-29 MRN: [id number]  Primary Cardiologist: Rozann Lesches, MD  Chart reviewed as part of pre-operative protocol coverage. Pre-op clearance already addressed by colleagues in earlier phone notes. To summarize recommendations:   Per office protocol, patient can hold warfarin for 5 days prior to procedure.   Patient will not need bridging with Lovenox (enoxaparin) around procedure.  No medical clearance requested.    Will route this bundled recommendation to requesting provider via Epic fax function and remove from pre-op pool. Please call with questions.  Elgie Collard, PA-C 12/15/2022, 11:02 AM

## 2022-12-15 NOTE — Telephone Encounter (Signed)
Patient with diagnosis of atrial fibrillation on warfarin for anticoagulation.    Procedure: esophagogastroduodenoscopy Date of procedure: 01/02/23   CHA2DS2-VASc Score = 4   This indicates a 4.8% annual risk of stroke. The patient's score is based upon: CHF History: 1 HTN History: 0 Diabetes History: 0 Stroke History: 0 Vascular Disease History: 1 Age Score: 2 Gender Score: 0    CrCl 38 Platelet count 251  Per office protocol, patient can hold warfarin for 5 days prior to procedure.   Patient will not need bridging with Lovenox (enoxaparin) around procedure.  **This guidance is not considered finalized until pre-operative APP has relayed final recommendations.**

## 2022-12-18 DIAGNOSIS — M79674 Pain in right toe(s): Secondary | ICD-10-CM | POA: Diagnosis not present

## 2022-12-18 DIAGNOSIS — M79675 Pain in left toe(s): Secondary | ICD-10-CM | POA: Diagnosis not present

## 2022-12-18 DIAGNOSIS — I739 Peripheral vascular disease, unspecified: Secondary | ICD-10-CM | POA: Diagnosis not present

## 2022-12-18 DIAGNOSIS — M79672 Pain in left foot: Secondary | ICD-10-CM | POA: Diagnosis not present

## 2022-12-18 DIAGNOSIS — L110 Acquired keratosis follicularis: Secondary | ICD-10-CM | POA: Diagnosis not present

## 2022-12-18 DIAGNOSIS — M79671 Pain in right foot: Secondary | ICD-10-CM | POA: Diagnosis not present

## 2022-12-19 ENCOUNTER — Encounter: Payer: Self-pay | Admitting: *Deleted

## 2022-12-19 NOTE — Telephone Encounter (Signed)
Hi Monchell, No you do not need to do anything for this patient as he has been approved for patient assistance with his Stelara. Thanks,

## 2022-12-19 NOTE — Telephone Encounter (Signed)
Pt has been scheduled and was advised that instructions were mailed by can stop by to pick up copy when he goes to pre-op visit on 12/29/22

## 2022-12-19 NOTE — Telephone Encounter (Signed)
Thanks Tammy, please schedule him and ask him to hold his coumadin for 5 days. Thanks

## 2022-12-19 NOTE — Telephone Encounter (Signed)
LMTRC

## 2022-12-22 ENCOUNTER — Ambulatory Visit (HOSPITAL_COMMUNITY)
Admission: RE | Admit: 2022-12-22 | Discharge: 2022-12-22 | Disposition: A | Discharge status: Home / Self Care | Payer: PPO | Source: Ambulatory Visit | Attending: Gastroenterology | Admitting: Gastroenterology

## 2022-12-22 ENCOUNTER — Encounter (HOSPITAL_COMMUNITY): Payer: Self-pay

## 2022-12-22 DIAGNOSIS — K5730 Diverticulosis of large intestine without perforation or abscess without bleeding: Secondary | ICD-10-CM | POA: Insufficient documentation

## 2022-12-22 DIAGNOSIS — C7A8 Other malignant neuroendocrine tumors: Secondary | ICD-10-CM | POA: Diagnosis not present

## 2022-12-22 DIAGNOSIS — J90 Pleural effusion, not elsewhere classified: Secondary | ICD-10-CM | POA: Diagnosis not present

## 2022-12-22 DIAGNOSIS — E340 Carcinoid syndrome: Secondary | ICD-10-CM | POA: Diagnosis not present

## 2022-12-22 DIAGNOSIS — N281 Cyst of kidney, acquired: Secondary | ICD-10-CM | POA: Diagnosis not present

## 2022-12-22 MED ORDER — IOHEXOL 300 MG/ML  SOLN
100.0000 mL | Freq: Once | INTRAMUSCULAR | Status: AC | PRN
  Administered 2022-12-22: 100 mL via INTRAVENOUS

## 2022-12-22 MED ORDER — IOHEXOL 9 MG/ML PO SOLN
ORAL | Status: AC
  Filled 2022-12-22: qty 1000

## 2022-12-27 NOTE — Patient Instructions (Signed)
MACYN HAEGER  12/27/2022     '@PREFPERIOPPHARMACY'$ @   Your procedure is scheduled on  01/02/2023.    Report to [facility] at  1130  A.M.   Call this number if you have problems the morning of surgery:  [phone]  If you experience any cold or flu symptoms such as cough, fever, chills, shortness of breath, etc. between now and your scheduled surgery, please notify us at the above number.   Remember:  Follow the diet instructions given to you by the office.        Your last dose of coumadin should be on 12/27/2022.      Take these medicines the morning of surgery with A SIP OF WATER             carvedilol, diltiazem, levothyroxine, protonix.     Do not wear jewelry, make-up or nail polish.  Do not wear lotions, powders, or perfumes, or deodorant.  Do not shave 48 hours prior to surgery.  Men may shave face and neck.  Do not bring valuables to the hospital.  [facility] is not responsible for any belongings or valuables.  Contacts, dentures or bridgework may not be worn into surgery.  Leave your suitcase in the car.  After surgery it may be brought to your room.  For patients admitted to the hospital, discharge time will be determined by your treatment team.  Patients discharged the day of surgery will not be allowed to drive home and must have someone with them for 24 hours.    Special instructions:   DO NOT smoke tobacco or vape for 24 hours before your procedure.  Please read over the following fact sheets that you were given. Anesthesia Post-op Instructions and Care and Recovery After Surgery      Upper Endoscopy, Adult, Care After After the procedure, it is common to have a sore throat. It is also common to have: Mild stomach pain or discomfort. Bloating. Nausea. Follow these instructions at home: The instructions below may help you care for yourself at home. Your health care provider may give you more instructions. If you have questions, ask your  health care provider. If you were given a sedative during the procedure, it can affect you for several hours. Do not drive or operate machinery until your health care provider says that it is safe. If you will be going home right after the procedure, plan to have a responsible adult: Take you home from the hospital or clinic. You will not be allowed to drive. Care for you for the time you are told. Follow instructions from your health care provider about what you may eat and drink. Return to your normal activities as told by your health care provider. Ask your health care provider what activities are safe for you. Take over-the-counter and prescription medicines only as told by your health care provider. Contact a health care provider if you: Have a sore throat that lasts longer than one day. Have trouble swallowing. Have a fever. Get help right away if you: Vomit blood or your vomit looks like coffee grounds. Have bloody, black, or tarry stools. Have a very bad sore throat or you cannot swallow. Have difficulty breathing or very bad pain in your chest or abdomen. These symptoms may be an emergency. Get help right away. Call 911. Do not wait to see if the symptoms will go away. Do not drive yourself to the hospital. Summary After the  procedure, it is common to have a sore throat, mild stomach discomfort, bloating, and nausea. If you were given a sedative during the procedure, it can affect you for several hours. Do not drive until your health care provider says that it is safe. Follow instructions from your health care provider about what you may eat and drink. Return to your normal activities as told by your health care provider. This information is not intended to replace advice given to you by your health care provider. Make sure you discuss any questions you have with your health care provider. Document Revised: 01/11/2022 Document Reviewed: 01/11/2022 Elsevier Patient Education  [place] After The following information offers guidance on how to care for yourself after your procedure. Your health care provider may also give you more specific instructions. If you have problems or questions, contact your health care provider. What can I expect after the procedure? After the procedure, it is common to have: Tiredness. Little or no memory about what happened during or after the procedure. Impaired judgment when it comes to making decisions. Nausea or vomiting. Some trouble with balance. Follow these instructions at home: For the time period you were told by your health care provider:  Rest. Do not participate in activities where you could fall or become injured. Do not drive or use machinery. Do not drink alcohol. Do not take sleeping pills or medicines that cause drowsiness. Do not make important decisions or sign legal documents. Do not take care of children on your own. Medicines Take over-the-counter and prescription medicines only as told by your health care provider. If you were prescribed antibiotics, take them as told by your health care provider. Do not stop using the antibiotic even if you start to feel better. Eating and drinking Follow instructions from your health care provider about what you may eat and drink. Drink enough fluid to keep your urine pale yellow. If you vomit: Drink clear fluids slowly and in small amounts as you are able. Clear fluids include water, ice chips, low-calorie sports drinks, and fruit juice that has water added to it (diluted fruit juice). Eat light and bland foods in small amounts as you are able. These foods include bananas, applesauce, rice, lean meats, toast, and crackers. General instructions  Have a responsible adult stay with you for the time you are told. It is important to have someone help care for you until you are awake and alert. If you have sleep apnea, surgery and some  medicines can increase your risk for breathing problems. Follow instructions from your health care provider about wearing your sleep device: When you are sleeping. This includes during daytime naps. While taking prescription pain medicines, sleeping medicines, or medicines that make you drowsy. Do not use any products that contain nicotine or tobacco. These products include cigarettes, chewing tobacco, and vaping devices, such as e-cigarettes. If you need help quitting, ask your health care provider. Contact a health care provider if: You feel nauseous or vomit every time you eat or drink. You feel light-headed. You are still sleepy or having trouble with balance after 24 hours. You get a rash. You have a fever. You have redness or swelling around the IV site. Get help right away if: You have trouble breathing. You have new confusion after you get home. These symptoms may be an emergency. Get help right away. Call 911. Do not wait to see if the symptoms will go away. Do not drive yourself  to the hospital. This information is not intended to replace advice given to you by your health care provider. Make sure you discuss any questions you have with your health care provider. Document Revised: 02/27/2022 Document Reviewed: 02/27/2022 Elsevier Patient Education  [place].

## 2022-12-29 ENCOUNTER — Encounter (HOSPITAL_COMMUNITY): Payer: Self-pay

## 2022-12-29 ENCOUNTER — Encounter (HOSPITAL_COMMUNITY)
Admission: RE | Admit: 2022-12-29 | Discharge: 2022-12-29 | Disposition: A | Discharge status: Home / Self Care | Payer: PPO | Source: Ambulatory Visit | Attending: Gastroenterology | Admitting: Gastroenterology

## 2022-12-29 VITALS — BP 155/74 | HR 69 | Temp 98.4°F | Resp 18 | Ht 68.0 in | Wt 156.1 lb

## 2022-12-29 DIAGNOSIS — Z01812 Encounter for preprocedural laboratory examination: Secondary | ICD-10-CM | POA: Diagnosis not present

## 2022-12-29 DIAGNOSIS — Z79899 Other long term (current) drug therapy: Secondary | ICD-10-CM | POA: Diagnosis not present

## 2022-12-29 DIAGNOSIS — Z862 Personal history of diseases of the blood and blood-forming organs and certain disorders involving the immune mechanism: Secondary | ICD-10-CM | POA: Diagnosis not present

## 2022-12-29 HISTORY — DX: Cardiac arrhythmia, unspecified: I49.9

## 2022-12-29 LAB — CBC WITH DIFFERENTIAL/PLATELET
Abs Immature Granulocytes: 0.01 10*3/uL (ref 0.00–0.07)
Basophils Absolute: 0.1 10*3/uL (ref 0.0–0.1)
Basophils Relative: 1 %
Eosinophils Absolute: 0.1 10*3/uL (ref 0.0–0.5)
Eosinophils Relative: 2 %
HCT: 33.7 % — ABNORMAL LOW (ref 39.0–52.0)
Hemoglobin: 11.2 g/dL — ABNORMAL LOW (ref 13.0–17.0)
Immature Granulocytes: 0 %
Lymphocytes Relative: 39 %
Lymphs Abs: 2.1 10*3/uL (ref 0.7–4.0)
MCH: 30.8 pg (ref 26.0–34.0)
MCHC: 33.2 g/dL (ref 30.0–36.0)
MCV: 92.6 fL (ref 80.0–100.0)
Monocytes Absolute: 0.5 10*3/uL (ref 0.1–1.0)
Monocytes Relative: 9 %
Neutro Abs: 2.6 10*3/uL (ref 1.7–7.7)
Neutrophils Relative %: 49 %
Platelets: 179 10*3/uL (ref 150–400)
RBC: 3.64 MIL/uL — ABNORMAL LOW (ref 4.22–5.81)
RDW: 13.6 % (ref 11.5–15.5)
WBC: 5.4 10*3/uL (ref 4.0–10.5)
nRBC: 0 % (ref 0.0–0.2)

## 2022-12-29 LAB — BASIC METABOLIC PANEL
Anion gap: 10 (ref 5–15)
BUN: 16 mg/dL (ref 8–23)
CO2: 23 mmol/L (ref 22–32)
Calcium: 8.5 mg/dL — ABNORMAL LOW (ref 8.9–10.3)
Chloride: 104 mmol/L (ref 98–111)
Creatinine, Ser: 1.5 mg/dL — ABNORMAL HIGH (ref 0.61–1.24)
GFR, Estimated: 44 mL/min — ABNORMAL LOW (ref >60)
Glucose, Bld: 101 mg/dL — ABNORMAL HIGH (ref 70–99)
Potassium: 3.3 mmol/L — ABNORMAL LOW (ref 3.5–5.1)
Sodium: 137 mmol/L (ref 135–145)

## 2023-01-02 ENCOUNTER — Telehealth (INDEPENDENT_AMBULATORY_CARE_PROVIDER_SITE_OTHER): Payer: Self-pay

## 2023-01-02 ENCOUNTER — Ambulatory Visit (HOSPITAL_COMMUNITY)
Admission: RE | Admit: 2023-01-02 | Discharge: 2023-01-02 | Disposition: A | Discharge status: Home / Self Care | Payer: PPO | Attending: Gastroenterology | Admitting: Gastroenterology

## 2023-01-02 ENCOUNTER — Encounter (HOSPITAL_COMMUNITY)
Admission: RE | Disposition: A | Discharge status: Home / Self Care | Payer: Self-pay | Source: Home / Self Care | Attending: Gastroenterology

## 2023-01-02 ENCOUNTER — Ambulatory Visit (HOSPITAL_BASED_OUTPATIENT_CLINIC_OR_DEPARTMENT_OTHER): Payer: PPO | Admitting: Anesthesiology

## 2023-01-02 ENCOUNTER — Encounter (HOSPITAL_COMMUNITY): Payer: Self-pay | Admitting: Gastroenterology

## 2023-01-02 ENCOUNTER — Ambulatory Visit (HOSPITAL_COMMUNITY): Payer: PPO | Admitting: Anesthesiology

## 2023-01-02 DIAGNOSIS — Z79899 Other long term (current) drug therapy: Secondary | ICD-10-CM | POA: Insufficient documentation

## 2023-01-02 DIAGNOSIS — K449 Diaphragmatic hernia without obstruction or gangrene: Secondary | ICD-10-CM

## 2023-01-02 DIAGNOSIS — K2100 Gastro-esophageal reflux disease with esophagitis, without bleeding: Secondary | ICD-10-CM | POA: Diagnosis not present

## 2023-01-02 DIAGNOSIS — K3189 Other diseases of stomach and duodenum: Secondary | ICD-10-CM | POA: Insufficient documentation

## 2023-01-02 DIAGNOSIS — K5000 Crohn's disease of small intestine without complications: Secondary | ICD-10-CM | POA: Insufficient documentation

## 2023-01-02 DIAGNOSIS — I340 Nonrheumatic mitral (valve) insufficiency: Secondary | ICD-10-CM

## 2023-01-02 DIAGNOSIS — D638 Anemia in other chronic diseases classified elsewhere: Secondary | ICD-10-CM | POA: Diagnosis not present

## 2023-01-02 DIAGNOSIS — I700 Atherosclerosis of aorta: Secondary | ICD-10-CM | POA: Insufficient documentation

## 2023-01-02 DIAGNOSIS — I2510 Atherosclerotic heart disease of native coronary artery without angina pectoris: Secondary | ICD-10-CM

## 2023-01-02 DIAGNOSIS — E039 Hypothyroidism, unspecified: Secondary | ICD-10-CM | POA: Diagnosis not present

## 2023-01-02 DIAGNOSIS — I4891 Unspecified atrial fibrillation: Secondary | ICD-10-CM | POA: Insufficient documentation

## 2023-01-02 DIAGNOSIS — K2289 Other specified disease of esophagus: Secondary | ICD-10-CM

## 2023-01-02 DIAGNOSIS — D3A8 Other benign neuroendocrine tumors: Secondary | ICD-10-CM | POA: Insufficient documentation

## 2023-01-02 DIAGNOSIS — K209 Esophagitis, unspecified without bleeding: Secondary | ICD-10-CM | POA: Diagnosis not present

## 2023-01-02 HISTORY — PX: BIOPSY: SHX5522

## 2023-01-02 HISTORY — PX: ESOPHAGOGASTRODUODENOSCOPY (EGD) WITH PROPOFOL: SHX5813

## 2023-01-02 SURGERY — ESOPHAGOGASTRODUODENOSCOPY (EGD) WITH PROPOFOL
Anesthesia: General

## 2023-01-02 MED ORDER — LACTATED RINGERS IV SOLN: INTRAVENOUS | Status: DC

## 2023-01-02 MED ORDER — LIDOCAINE HCL (CARDIAC) PF 100 MG/5ML IV SOSY
PREFILLED_SYRINGE | INTRAVENOUS | Status: DC | PRN
  Administered 2023-01-02: 50 mg via INTRAVENOUS

## 2023-01-02 MED ORDER — PHENYLEPHRINE 80 MCG/ML (10ML) SYRINGE FOR IV PUSH (FOR BLOOD PRESSURE SUPPORT)
PREFILLED_SYRINGE | INTRAVENOUS | Status: AC
  Filled 2023-01-02: qty 10

## 2023-01-02 MED ORDER — PROPOFOL 10 MG/ML IV BOLUS
INTRAVENOUS | Status: DC | PRN
  Administered 2023-01-02: 100 mg via INTRAVENOUS

## 2023-01-02 MED ORDER — PHENYLEPHRINE 80 MCG/ML (10ML) SYRINGE FOR IV PUSH (FOR BLOOD PRESSURE SUPPORT)
PREFILLED_SYRINGE | INTRAVENOUS | Status: DC | PRN
  Administered 2023-01-02 (×2): 160 ug via INTRAVENOUS

## 2023-01-02 MED ORDER — PROPOFOL 500 MG/50ML IV EMUL
INTRAVENOUS | Status: DC | PRN
  Administered 2023-01-02: 100 ug/kg/min via INTRAVENOUS

## 2023-01-02 MED ORDER — PANTOPRAZOLE SODIUM 40 MG PO TBEC: 40.0000 mg | DELAYED_RELEASE_TABLET | Freq: Every day | ORAL | 3 refills | Status: DC

## 2023-01-02 NOTE — Anesthesia Preprocedure Evaluation (Signed)
Anesthesia Evaluation  Patient identified by MRN, date of birth, ID band Patient awake    Reviewed: Allergy & Precautions, NPO status , Patient's Chart, lab work & pertinent test results, reviewed documented beta blocker date and time   Airway Mallampati: III  TM Distance: >3 FB Neck ROM: Full    Dental  (+) Dental Advisory Given, Missing   Pulmonary shortness of breath and with exertion, pneumonia, resolved   Pulmonary exam normal breath sounds clear to auscultation       Cardiovascular (-) hypertensionPt. on home beta blockers and Pt. on medications + CAD  + dysrhythmias Atrial Fibrillation + Valvular Problems/Murmurs MR  Rhythm:Irregular Rate:Normal  1. Left ventricular ejection fraction, by estimation, is 50 to 55%. The  left ventricle has low normal function. The left ventricle has no regional  wall motion abnormalities. Left ventricular diastolic parameters are  indeterminate.   2. Right ventricular systolic function is normal. The right ventricular  size is normal. There is normal pulmonary artery systolic pressure.   3. Large pleural effusion in the left lateral region.   4. The mitral valve is grossly normal. Mild mitral valve regurgitation.  No evidence of mitral stenosis.   5. The aortic valve is tricuspid. There is mild calcification of the  aortic valve. There is mild thickening of the aortic valve. Aortic valve  regurgitation is trivial. No aortic stenosis is present.   6. The inferior vena cava is dilated in size with >50% respiratory  variability, suggesting right atrial pressure of 8 mmHg.   Comparison(s): Large left sided pleural effusion is new.      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[facility])     Endo/Other  Hypothyroidism    Renal/GU       Musculoskeletal negative musculoskeletal ROS (+)    Abdominal   Peds  Hematology  (+) Blood dyscrasia, anemia   Anesthesia Other Findings   Reproductive/Obstetrics negative OB ROS                              Anesthesia Physical Anesthesia Plan  ASA: 3  Anesthesia Plan: General   Post-op Pain Management: Minimal or no pain anticipated   Induction: Intravenous  PONV Risk Score and Plan: Propofol infusion  Airway Management Planned: Nasal Cannula and Natural Airway  Additional Equipment:   Intra-op Plan:   Post-operative Plan:   Informed Consent: I have reviewed the patients History and Physical, chart, labs and discussed the procedure including the risks, benefits and alternatives for the proposed anesthesia with the patient or authorized representative who has indicated his/her understanding and acceptance.     Dental advisory given  Plan Discussed with: CRNA and Surgeon  Anesthesia Plan Comments:          Anesthesia Quick Evaluation

## 2023-01-02 NOTE — Telephone Encounter (Signed)
I spoke with [name] patient wife she says patient did well with his SQ Stelara, she was shown by the nurse with Stelara how to administer and feels confident on how to administer in the future.

## 2023-01-02 NOTE — Anesthesia Postprocedure Evaluation (Signed)
Anesthesia Post Note  Patient: [name]  Procedure(s) Performed: ESOPHAGOGASTRODUODENOSCOPY (EGD) WITH PROPOFOL BIOPSY  Patient location during evaluation: Phase II Anesthesia Type: General Level of consciousness: awake and alert and oriented Pain management: pain level controlled Vital Signs Assessment: post-procedure vital signs reviewed and stable Respiratory status: spontaneous breathing, nonlabored ventilation and respiratory function stable Cardiovascular status: blood pressure returned to baseline and stable Postop Assessment: no apparent nausea or vomiting Anesthetic complications: no  No notable events documented.   Last Vitals:  Vitals:   01/02/23 0916 01/02/23 1122  BP: (!) 164/91 136/71  Pulse: 85 78  Resp: (!) 24 17  Temp: 36.9 C 36.8 C  SpO2: 98% 98%    Last Pain:  Vitals:   01/02/23 1122  TempSrc: Oral  PainSc: 0-No pain                 Todd Jelinski C Sherae Santino

## 2023-01-02 NOTE — Anesthesia Procedure Notes (Signed)
Date/Time: 01/02/2023 11:03 AM  Performed by: Orlie Dakin, CRNAPre-anesthesia Checklist: Patient identified, Emergency Drugs available, Suction available and Patient being monitored Patient Re-evaluated:Patient Re-evaluated prior to induction Oxygen Delivery Method: Nasal cannula Induction Type: IV induction Placement Confirmation: positive ETCO2

## 2023-01-02 NOTE — Discharge Instructions (Addendum)
You are being discharged to home.  Resume your previous diet.  We are waiting for your pathology results.  Decrease pantoprazole to 40 mg qday. Restart coumadin tonight. Repeat EGD in 1 year.

## 2023-01-02 NOTE — H&P (Addendum)
[name] is an 89 y.o. male.   Chief Complaint: follow up esophagitis HPI: [name] is a 89 y.o. male  with past medical history of small bowel Crohn's disease status post resection complicated by recurrent stricture on Stelara, C. difficile colitis, BPH, coronary artery disease, atrial fibrillation, GERD, hypothyroidism, esophagitis, duodenal carcinoid, who presents for follow up of esophagitis.  The patient denies having any nausea, vomiting, fever, chills, hematochezia, melena, hematemesis, abdominal distention, abdominal pain, diarrhea, jaundice, pruritus or weight loss. Has been taking pantoprazole BID compliantly.  Past Medical History:  Diagnosis Date   Anemia    Atrial fibrillation (HCC)    BPH (benign prostatic hyperplasia)    CAD (coronary artery disease)    Catheterization 2004, mild/moderate nonobstructive disease  /   nuclear, 2007, small inferior scar // no ischemia   Crohn's disease ([place])    Dysrhythmia    Elevated PSA    GERD (gastroesophageal reflux disease)    History of kidney stones    History of pneumonia    Hypothyroidism    Mitral regurgitation    Pneumonia    SBO (small bowel obstruction) ([place])    Urinary retention    Wears glasses     Past Surgical History:  Procedure Laterality Date   AGILE CAPSULE  10/18/2011   Procedure: AGILE CAPSULE;  Surgeon: Rogene Houston, MD;  [facility];  Service: Endoscopy;  Laterality: N/A;  730   BIOPSY  10/05/2022   Procedure: BIOPSY;  Surgeon: Harvel Quale, MD;  [facility];  Service: Gastroenterology;;   CARDIAC CATHETERIZATION  2004   CATARACT EXTRACTION W/PHACO Right 06/27/2021   Procedure: CATARACT EXTRACTION PHACO AND INTRAOCULAR LENS PLACEMENT ([place]);  Surgeon: Baruch Goldmann, MD;  Location: [facility];  Service: Ophthalmology;  Laterality: Right;  CDE 21.98   CATARACT EXTRACTION W/PHACO Left 07/11/2021   Procedure: CATARACT EXTRACTION PHACO AND INTRAOCULAR LENS PLACEMENT LEFT  EYE;  Surgeon: Baruch Goldmann, MD;  Location: [facility];  Service: Ophthalmology;  Laterality: Left;  CDE=10.42   CHOLECYSTECTOMY  2010   Dr. Anthony Sar   COLONOSCOPY  2008   [place]   COLONOSCOPY N/A 03/23/2016   Procedure: COLONOSCOPY;  Surgeon: Rogene Houston, MD;  [facility];  Service: Endoscopy;  Laterality: N/A;  1:00   CYSTOSCOPY WITH INSERTION OF UROLIFT     ESOPHAGEAL BRUSHING  10/05/2022   Procedure: ESOPHAGEAL BRUSHING;  Surgeon: Harvel Quale, MD;  [facility];  Service: Gastroenterology;;   ESOPHAGOGASTRODUODENOSCOPY  11/08/2011   Procedure: ESOPHAGOGASTRODUODENOSCOPY (EGD);  Surgeon: Rogene Houston, MD;  [facility];  Service: Endoscopy;  Laterality: N/A;  300   ESOPHAGOGASTRODUODENOSCOPY (EGD) WITH PROPOFOL N/A 10/05/2022   Procedure: ESOPHAGOGASTRODUODENOSCOPY (EGD) WITH PROPOFOL;  Surgeon: Harvel Quale, MD;  [facility];  Service: Gastroenterology;  Laterality: N/A;   HEMOSTASIS CLIP PLACEMENT  10/05/2022   Procedure: HEMOSTASIS CLIP PLACEMENT;  Surgeon: Harvel Quale, MD;  [facility];  Service: Gastroenterology;;   POLYPECTOMY  03/23/2016   Procedure: POLYPECTOMY;  Surgeon: Rogene Houston, MD;  [facility];  Service: Endoscopy;;  Cecal polyp removed via cold forceps recto-sigmoid polyp removed via cold snare   POLYPECTOMY  10/05/2022   Procedure: POLYPECTOMY;  Surgeon: Harvel Quale, MD;  [facility];  Service: Gastroenterology;;   TRANSURETHRAL RESECTION OF PROSTATE N/A 04/29/2020   Procedure: TRANSURETHRAL RESECTION OF THE PROSTATE (TURP);  Surgeon: Franchot Gallo, MD;  Location: [place];  Service: Urology;  Laterality: N/A;  26 MINS    Family History  Problem Relation Age of Onset   Colon cancer Mother    Heart disease Father    Stroke Brother    Healthy Daughter    Social History:  reports that he has never smoked.  He has never been exposed to tobacco smoke. He has never used smokeless tobacco. He reports that he does not drink alcohol and does not use drugs.  Allergies: No Known Allergies  Medications Prior to Admission  Medication Sig Dispense Refill   acetaminophen (TYLENOL) 500 MG tablet Take 500 mg by mouth every 6 (six) hours as needed for mild pain or moderate pain.     CALCIUM PO Take 300 mg by mouth in the morning and at bedtime.     carvedilol (COREG) 12.5 MG tablet Take 1 tablet (12.5 mg total) by mouth 2 (two) times daily. 60 tablet 0   Cholecalciferol (VITAMIN D3) 50 MCG (2000 UT) TABS Take 2,000 Units by mouth daily.     diltiazem (CARDIZEM CD) 180 MG 24 hr capsule Take 180 mg by mouth 2 (two) times daily.     ferrous sulfate (FEROSUL) 325 (65 FE) MG tablet Take 325 mg by mouth daily with breakfast.     furosemide (LASIX) 20 MG tablet Take 1 tablet (20 mg total) by mouth as needed (no more than one to two days at a time for leg swelling). 30 tablet 0   levothyroxine (SYNTHROID, LEVOTHROID) 75 MCG tablet Take 75 mcg by mouth daily before breakfast.     magnesium oxide (MAG-OX) 400 (240 Mg) MG tablet Take 400 mg by mouth 2 (two) times daily.     Melatonin 10 MG TABS Take 10 mg by mouth at bedtime.     nitroGLYCERIN (NITROSTAT) 0.4 MG SL tablet Place 0.4 mg under the tongue every 5 (five) minutes as needed. For chest pains. May repeat for up to 3 doses.     pantoprazole (PROTONIX) 40 MG tablet Take 1 tablet (40 mg total) by mouth 2 (two) times daily. 60 tablet 0   Probiotic Product (ALIGN) 4 MG CAPS Take 4 mg by mouth in the morning.     traZODone (DESYREL) 50 MG tablet Take 50 mg by mouth at bedtime.     ustekinumab (STELARA) 90 MG/ML SOSY injection Inject 1 mL (90 mg total) into the skin every 8 (eight) weeks. 1 mL 6   warfarin (COUMADIN) 2.5 MG tablet TAKE 1-1 &1/2 TABLETS BY MOUTH DAILY AS DIRECTED (Patient taking differently: Take 1.25-2.5 mg by mouth See admin instructions. Take 2.5 mg by  mouth daily except Thursday take 1.25 mg) 45 tablet 6    No results found for this or any previous visit (from the past 48 hour(s)). No results found.  Review of Systems  All other systems reviewed and are negative.   Blood pressure (!) 164/91, pulse 85, temperature 98.4 F (36.9 C), temperature source Oral, resp. rate (!) 24, height 5\' 8"  (1.727 m), weight 70.8 kg, SpO2 98 %. Physical Exam  GENERAL: The patient is AO x3, in no acute distress. HEENT: Head is normocephalic and atraumatic. EOMI are intact. Mouth is well hydrated and without lesions. NECK: Supple. No masses LUNGS: Clear to auscultation. No presence of rhonchi/wheezing/rales. Adequate chest expansion HEART: RRR, normal s1 and s2. ABDOMEN: Soft, nontender, no guarding, no peritoneal signs, and nondistended. BS +. No masses. EXTREMITIES: Without any cyanosis, clubbing, rash, lesions  or edema. NEUROLOGIC: AOx3, no focal motor deficit. SKIN: no jaundice, no rashes  Assessment/Plan  RIP SCHOENBAUER is a 89 y.o. male  with past medical history of small bowel Crohn's disease status post resection complicated by recurrent stricture on Stelara, C. difficile colitis, BPH, coronary artery disease, atrial fibrillation, GERD, hypothyroidism, esophagitis, duodenal carcinoid, who presents for follow up of esophagitis. WE will proceed with EGD.  Harvel Quale, MD 01/02/2023, 10:42 AM

## 2023-01-02 NOTE — Transfer of Care (Signed)
Immediate Anesthesia Transfer of Care Note  Patient: [name]  Procedure(s) Performed: ESOPHAGOGASTRODUODENOSCOPY (EGD) WITH PROPOFOL BIOPSY  Patient Location: Short Stay  Anesthesia Type:General  Level of Consciousness: awake  Airway & Oxygen Therapy: Patient Spontanous Breathing  Post-op Assessment: Report given to RN and Post -op Vital signs reviewed and stable  Post vital signs: Reviewed and stable  Last Vitals:  Vitals Value Taken Time  BP 136/71 01/02/23 1122  Temp 36.8 C 01/02/23 1122  Pulse 78 01/02/23 1122  Resp 17 01/02/23 1122  SpO2 98 % 01/02/23 1122    Last Pain:  Vitals:   01/02/23 1122  TempSrc: Oral  PainSc: 0-No pain         Complications: No notable events documented.

## 2023-01-02 NOTE — Op Note (Addendum)
[facility] Patient Name: [name] Procedure Date: 01/02/2023 9:05 AM MRN: [id number] Date of Birth: 08-Dec-1930 Attending MD: [name] , , [id number] CSN: [id number] Age: 89 Admit Type: Outpatient Procedure:                Upper GI endoscopy Indications:              Follow-up of reflux esophagitis Providers:                [name], Janeece Riggers, RN, Caprice Kluver,                            Rosina Lowenstein, RN Referring MD:              Medicines:                Monitored Anesthesia Care Complications:            No immediate complications. Estimated Blood Loss:     Estimated blood loss: none. Procedure:                Pre-Anesthesia Assessment:                           - Prior to the procedure, a History and Physical                            was performed, and patient medications, allergies                            and sensitivities were reviewed. The patient's                            tolerance of previous anesthesia was reviewed.                           - The risks and benefits of the procedure and the                            sedation options and risks were discussed with the                            patient. All questions were answered and informed                            consent was obtained.                           - ASA Grade Assessment: III - A patient with severe                            systemic disease.                           After obtaining informed consent, the endoscope was                            passed under direct vision. Throughout the  procedure, the patient's blood pressure, pulse, and                            oxygen saturations were monitored continuously. The                            [id number] scope was introduced through the                            mouth, and advanced to the second part of duodenum.                            The upper GI endoscopy was accomplished without                             difficulty. The patient tolerated the procedure                            well. Scope In: [id number] AM Scope Out: 11:17:37 AM Total Procedure Duration: 0 hours 10 minutes 43 seconds  Findings:      Patchy mucosal changes characterized by discoloration were found in the       lower third of the esophagus. This could correspond to areas of healed       esophagitis. No slmon colored mucosa was identified. Biopsies were taken       with a cold forceps for histology.      A 2 cm hiatal hernia was present.      The stomach was normal.      A single 15 mm submucosal nodule was found in the first portion of the       duodenum. The nodule was Paris classification Is (protruding, sessile).       This corresponded to the area of neuroendocrine tumor. Biopsies were       taken with a cold forceps for histology.      A buried metallic clip was found in the second portion of the duodenum.       It was buried between the layers of the small bowel. Impression:               - Discolored mucosa in the esophagus. Biopsied.                           - 2 cm hiatal hernia.                           - Normal stomach.                           - Submucosal nodule - NET found in the duodenum.                            Slightly larger in size. Biopsied.                           - Buried metallic clip in between layers of second  portion of small bowel. Moderate Sedation:      Per Anesthesia Care Recommendation:           - Discharge patient to home (ambulatory).                           - Resume previous diet.                           - Await pathology results.                           - Decrease pantoprazole to 40 mg qday.                           - Restart coumadin tonight.                           - Repeat EGD in 1 year. Procedure Code(s):        --- Professional ---                           [phone], Esophagogastroduodenoscopy, flexible,                             transoral; with biopsy, single or multiple Diagnosis Code(s):        --- Professional ---                           K22.89, Other specified disease of esophagus                           K44.9, Diaphragmatic hernia without obstruction or                            gangrene                           K31.89, Other diseases of stomach and duodenum                           K21.00, Gastro-esophageal reflux disease with                            esophagitis, without bleeding CPT copyright 2022 American Medical Association. All rights reserved. The codes documented in this report are preliminary and upon coder review may  be revised to meet current compliance requirements. [name], MD [name],  01/02/2023 11:28:57 AM This report has been signed electronically. Number of Addenda: 0

## 2023-01-03 ENCOUNTER — Ambulatory Visit: Payer: PPO | Attending: Cardiology

## 2023-01-03 DIAGNOSIS — I4891 Unspecified atrial fibrillation: Secondary | ICD-10-CM | POA: Diagnosis not present

## 2023-01-03 DIAGNOSIS — Z5181 Encounter for therapeutic drug level monitoring: Secondary | ICD-10-CM

## 2023-01-03 LAB — POCT INR: INR: 1.3 — AB (ref 2.0–3.0)

## 2023-01-03 NOTE — Patient Instructions (Addendum)
    Description   Take 1.5 tablets today, then tomorrow take 1 tablet, then resume same dosage 1 tablet daily except 1/2 tablet on Thursdays. Recheck in 1 week, post EGD on 01/02/23 (held Warfarin 5 days prior to procedure).

## 2023-01-04 LAB — SURGICAL PATHOLOGY

## 2023-01-09 ENCOUNTER — Other Ambulatory Visit: Payer: Self-pay | Admitting: Cardiology

## 2023-01-10 ENCOUNTER — Encounter: Payer: Self-pay | Admitting: *Deleted

## 2023-01-10 ENCOUNTER — Telehealth: Payer: Self-pay | Admitting: Cardiology

## 2023-01-10 ENCOUNTER — Encounter (HOSPITAL_COMMUNITY): Payer: Self-pay | Admitting: Gastroenterology

## 2023-01-10 DIAGNOSIS — I482 Chronic atrial fibrillation, unspecified: Secondary | ICD-10-CM | POA: Diagnosis not present

## 2023-01-10 DIAGNOSIS — R079 Chest pain, unspecified: Secondary | ICD-10-CM | POA: Diagnosis not present

## 2023-01-10 DIAGNOSIS — R072 Precordial pain: Secondary | ICD-10-CM | POA: Diagnosis not present

## 2023-01-10 DIAGNOSIS — Z6823 Body mass index (BMI) 23.0-23.9, adult: Secondary | ICD-10-CM | POA: Diagnosis not present

## 2023-01-10 NOTE — Telephone Encounter (Signed)
Pt called in stating his PCP did an EKG and he has a copy of it with him. Pt states he has a coumadin appt tomorrow and wants to know if its okay for him to drop those EKG results off for Dr. Domenic Polite to take a look. Please advise.

## 2023-01-10 NOTE — Telephone Encounter (Signed)
Reports seeing PCP today and EKG was done due to reporting intermittent chest pain. Denies active chest pain. Denies SOB or dizziness. Advised that EKG could be reviewed by provider. PCP notes from today's visit will be requested. Also gave first available appointment to see [name] on 01/19/2023 @2 :30 pm. Advised if he develops worsening symptoms, to go to the ED for an evaluation. Verbalized understanding.

## 2023-01-11 ENCOUNTER — Ambulatory Visit: Payer: PPO | Attending: Cardiology | Admitting: *Deleted

## 2023-01-11 ENCOUNTER — Encounter: Payer: Self-pay | Admitting: Cardiology

## 2023-01-11 DIAGNOSIS — Z5181 Encounter for therapeutic drug level monitoring: Secondary | ICD-10-CM

## 2023-01-11 DIAGNOSIS — I4891 Unspecified atrial fibrillation: Secondary | ICD-10-CM

## 2023-01-11 LAB — POCT INR: INR: 1.7 — AB (ref 2.0–3.0)

## 2023-01-11 NOTE — Patient Instructions (Signed)
Take 1.5 tablets today then resume 1 tablet daily except 1/2 tablet on Thursdays. Recheck in 1 week

## 2023-01-14 ENCOUNTER — Other Ambulatory Visit: Payer: Self-pay

## 2023-01-14 ENCOUNTER — Emergency Department (HOSPITAL_COMMUNITY): Payer: PPO

## 2023-01-14 ENCOUNTER — Encounter (HOSPITAL_COMMUNITY): Payer: Self-pay | Admitting: *Deleted

## 2023-01-14 ENCOUNTER — Emergency Department (HOSPITAL_COMMUNITY)
Admission: EM | Admit: 2023-01-14 | Discharge: 2023-01-14 | Disposition: A | Discharge status: Home / Self Care | Payer: PPO | Attending: Emergency Medicine | Admitting: Emergency Medicine

## 2023-01-14 DIAGNOSIS — R0602 Shortness of breath: Secondary | ICD-10-CM | POA: Diagnosis not present

## 2023-01-14 DIAGNOSIS — E876 Hypokalemia: Secondary | ICD-10-CM | POA: Insufficient documentation

## 2023-01-14 DIAGNOSIS — R0789 Other chest pain: Secondary | ICD-10-CM | POA: Diagnosis not present

## 2023-01-14 DIAGNOSIS — N189 Chronic kidney disease, unspecified: Secondary | ICD-10-CM | POA: Insufficient documentation

## 2023-01-14 DIAGNOSIS — I2510 Atherosclerotic heart disease of native coronary artery without angina pectoris: Secondary | ICD-10-CM | POA: Diagnosis not present

## 2023-01-14 DIAGNOSIS — R600 Localized edema: Secondary | ICD-10-CM | POA: Insufficient documentation

## 2023-01-14 DIAGNOSIS — Z7901 Long term (current) use of anticoagulants: Secondary | ICD-10-CM | POA: Diagnosis not present

## 2023-01-14 DIAGNOSIS — E782 Mixed hyperlipidemia: Secondary | ICD-10-CM | POA: Diagnosis not present

## 2023-01-14 DIAGNOSIS — R079 Chest pain, unspecified: Secondary | ICD-10-CM

## 2023-01-14 DIAGNOSIS — R008 Other abnormalities of heart beat: Secondary | ICD-10-CM | POA: Diagnosis not present

## 2023-01-14 DIAGNOSIS — E039 Hypothyroidism, unspecified: Secondary | ICD-10-CM | POA: Diagnosis not present

## 2023-01-14 DIAGNOSIS — I10 Essential (primary) hypertension: Secondary | ICD-10-CM | POA: Diagnosis not present

## 2023-01-14 DIAGNOSIS — K2100 Gastro-esophageal reflux disease with esophagitis, without bleeding: Secondary | ICD-10-CM | POA: Diagnosis not present

## 2023-01-14 LAB — PROTIME-INR
INR: 2.2 — ABNORMAL HIGH (ref 0.8–1.2)
Prothrombin Time: 24.3 seconds — ABNORMAL HIGH (ref 11.4–15.2)

## 2023-01-14 LAB — CBC
HCT: 34.8 % — ABNORMAL LOW (ref 39.0–52.0)
Hemoglobin: 11.4 g/dL — ABNORMAL LOW (ref 13.0–17.0)
MCH: 29.8 pg (ref 26.0–34.0)
MCHC: 32.8 g/dL (ref 30.0–36.0)
MCV: 90.9 fL (ref 80.0–100.0)
Platelets: 154 10*3/uL (ref 150–400)
RBC: 3.83 MIL/uL — ABNORMAL LOW (ref 4.22–5.81)
RDW: 13.6 % (ref 11.5–15.5)
WBC: 6.1 10*3/uL (ref 4.0–10.5)
nRBC: 0 % (ref 0.0–0.2)

## 2023-01-14 LAB — BASIC METABOLIC PANEL
Anion gap: 8 (ref 5–15)
BUN: 19 mg/dL (ref 8–23)
CO2: 23 mmol/L (ref 22–32)
Calcium: 8.4 mg/dL — ABNORMAL LOW (ref 8.9–10.3)
Chloride: 105 mmol/L (ref 98–111)
Creatinine, Ser: 1.43 mg/dL — ABNORMAL HIGH (ref 0.61–1.24)
GFR, Estimated: 47 mL/min — ABNORMAL LOW (ref >60)
Glucose, Bld: 126 mg/dL — ABNORMAL HIGH (ref 70–99)
Potassium: 3.4 mmol/L — ABNORMAL LOW (ref 3.5–5.1)
Sodium: 136 mmol/L (ref 135–145)

## 2023-01-14 LAB — TROPONIN I (HIGH SENSITIVITY)
Troponin I (High Sensitivity): 6 ng/L (ref <18)
Troponin I (High Sensitivity): 5 ng/L (ref <18)

## 2023-01-14 NOTE — ED Provider Notes (Signed)
[place] Provider Note   CSN: [id number] Arrival date & time: 01/14/23  [id number]     History  Chief Complaint  Patient presents with   Chest Pain    [name] is a 89 y.o. male.  He has a history of coronary disease A-fib on Coumadin.  He is complaining of some intermittent left-sided chest pain mostly behind his left shoulder through the axilla into his chest that is been going on for the last 4 days.  He said it lasts a few seconds at a time comes and goes.  Saw his primary care doctor for it.  Happened again this morning and so wanted to get evaluated.  He does feel little short of breath with it.  No nausea or vomiting cough fever diaphoresis.  Did said he felt a little chilled today.  Rates his pain a 0 out of 10 currently although was 6 out of 10 earlier.  Has been taking his regular medications.  He did have to hold his Coumadin last week for an EGD  The history is provided by the patient.  Chest Pain Pain location:  L chest Pain quality: aching   Pain radiates to:  L shoulder Pain severity:  Moderate Onset quality:  Gradual Duration:  5 days Timing:  Sporadic Chronicity:  New Relieved by:  None tried Worsened by:  Nothing Ineffective treatments:  None tried Associated symptoms: shortness of breath   Associated symptoms: no abdominal pain, no cough, no diaphoresis, no fever, no nausea and no vomiting   Risk factors: coronary artery disease   Risk factors: no smoking        Home Medications Prior to Admission medications   Medication Sig Start Date End Date Taking? Authorizing Provider  acetaminophen (TYLENOL) 500 MG tablet Take 500 mg by mouth every 6 (six) hours as needed for mild pain or moderate pain.    [provider]  CALCIUM PO Take 300 mg by mouth in the morning and at bedtime.    [provider]  carvedilol (COREG) 12.5 MG tablet Take 1 tablet (12.5 mg total) by mouth 2 (two) times daily.  10/10/22 01/02/23  Manuella Ghazi, Pratik D, DO  Cholecalciferol (VITAMIN D3) 50 MCG (2000 UT) TABS Take 2,000 Units by mouth daily.    [provider]  diltiazem (CARDIZEM CD) 180 MG 24 hr capsule Take 180 mg by mouth 2 (two) times daily.    [provider]  ferrous sulfate (FEROSUL) 325 (65 FE) MG tablet Take 325 mg by mouth daily with breakfast.    [provider]  levothyroxine (SYNTHROID, LEVOTHROID) 75 MCG tablet Take 75 mcg by mouth daily before breakfast.    [provider]  magnesium oxide (MAG-OX) 400 (240 Mg) MG tablet Take 400 mg by mouth 2 (two) times daily.    [provider]  Melatonin 10 MG TABS Take 10 mg by mouth at bedtime.    [provider]  nitroGLYCERIN (NITROSTAT) 0.4 MG SL tablet Place 0.4 mg under the tongue every 5 (five) minutes as needed. For chest pains. May repeat for up to 3 doses.    [provider]  pantoprazole (PROTONIX) 40 MG tablet Take 1 tablet (40 mg total) by mouth daily. 01/02/23   Harvel Quale, MD  Probiotic Product (ALIGN) 4 MG CAPS Take 4 mg by mouth in the morning.    [provider]  traZODone (DESYREL) 50 MG tablet Take 50 mg by  mouth at bedtime.    [provider]  ustekinumab (STELARA) 90 MG/ML SOSY injection Inject 1 mL (90 mg total) into the skin every 8 (eight) weeks. 10/31/22   Harvel Quale, MD  warfarin (COUMADIN) 2.5 MG tablet TAKE 1-1 &1/2 TABLETS BY MOUTH DAILY AS DIRECTED Patient taking differently: Take 1.25-2.5 mg by mouth See admin instructions. Take 2.5 mg by mouth daily except Thursday take 1.25 mg 08/14/22   Satira Sark, MD      Allergies    Patient has no known allergies.    Review of Systems   Review of Systems  Constitutional:  Negative for diaphoresis and fever.  Respiratory:  Positive for shortness of breath. Negative for cough.   Cardiovascular:  Positive for chest pain.  Gastrointestinal:  Negative for abdominal pain,  nausea and vomiting.    Physical Exam Updated Vital Signs BP (!) 161/93   Pulse (!) 47   Temp 98.2 F (36.8 C) (Oral)   Resp (!) 25   Ht 5\' 8"  (1.727 m)   Wt 70.8 kg   SpO2 98%   BMI 23.73 kg/m  Physical Exam Vitals and nursing note reviewed.  Constitutional:      General: He is not in acute distress.    Appearance: He is well-developed.  HENT:     Head: Normocephalic and atraumatic.  Eyes:     Conjunctiva/sclera: Conjunctivae normal.  Cardiovascular:     Rate and Rhythm: Normal rate. Rhythm irregular.     Heart sounds: Normal heart sounds. No murmur heard. Pulmonary:     Effort: Pulmonary effort is normal. No respiratory distress.     Breath sounds: Normal breath sounds.  Abdominal:     Palpations: Abdomen is soft.     Tenderness: There is no abdominal tenderness.  Musculoskeletal:        General: No swelling.     Cervical back: Neck supple.     Right lower leg: No tenderness. Edema present.     Left lower leg: No tenderness. Edema present.     Comments: He has probably 1+ edema bilateral lower extremity symmetric.  No cords appreciated  Skin:    General: Skin is warm and dry.     Capillary Refill: Capillary refill takes less than 2 seconds.  Neurological:     General: No focal deficit present.     Mental Status: He is alert.     ED Results / Procedures / Treatments   Labs (all labs ordered are listed, but only abnormal results are displayed) Labs Reviewed  BASIC METABOLIC PANEL - Abnormal; Notable for the following components:      Result Value   Potassium 3.4 (*)    Glucose, Bld 126 (*)    Creatinine, Ser 1.43 (*)    Calcium 8.4 (*)    GFR, Estimated 47 (*)    All other components within normal limits  CBC - Abnormal; Notable for the following components:   RBC 3.83 (*)    Hemoglobin 11.4 (*)    HCT 34.8 (*)    All other components within normal limits  PROTIME-INR - Abnormal; Notable for the following components:   Prothrombin Time 24.3 (*)     INR 2.2 (*)    All other components within normal limits  TROPONIN I (HIGH SENSITIVITY)  TROPONIN I (HIGH SENSITIVITY)    EKG EKG Interpretation  Date/Time:  Sunday January 14 2023 09:46:25 EDT Ventricular Rate:  73 PR Interval:    QRS Duration: 98  QT Interval:  370 QTC Calculation: 408 R Axis:   77 Text Interpretation: Atrial fibrillation Low voltage, extremity leads Probable anteroseptal infarct, old No significant change since prior 1/24 Confirmed by Aletta Edouard [phone]) on 01/14/2023 9:49:23 AM  Radiology DG Chest Portable 1 View  Result Date: 01/14/2023 CLINICAL DATA:  chest pain EXAM: PORTABLE CHEST - 1 VIEW COMPARISON:  11/22/2022 FINDINGS: Lungs are clear. Heart size and mediastinal contours are within normal limits. No effusion. Visualized bones unremarkable. IMPRESSION: No acute cardiopulmonary disease. Electronically Signed   By: Lucrezia Europe M.D.   On: 01/14/2023 10:16    Procedures Procedures    Medications Ordered in ED Medications - No data to display  ED Course/ Medical Decision Making/ A&P Clinical Course as of 01/14/23 1703  Sun Jan 14, 2023  1014 Chest x-ray interpreted by me as possible atelectasis right base.  No gross infiltrates.  Awaiting radiology reading. [MB]  1231 Patient feels well and has not had any further episodes of chest pain here.  He actually has an appointment on Friday with the cardiology office.  Recommended he continue his regular medications and keep his appointment.  Return instructions discussed [MB]    Clinical Course User Index [MB] Hayden Rasmussen, MD                             Medical Decision Making Amount and/or Complexity of Data Reviewed Labs: ordered. Radiology: ordered.   This patient complains of intermittent scapular and chest pain; this involves an extensive number of treatment Options and is a complaint that carries with it a high risk of complications and morbidity. The differential includes ACS, pneumonia,  pneumothorax, PE, vascular, musculoskeletal, reflux  I ordered, reviewed and interpreted labs, which included CBC with normal white count, stable low hemoglobin, chemistries with mildly low potassium chronic CKD, INR therapeutic, troponins flat I ordered imaging studies which included chest x-ray and I independently    visualized and interpreted imaging which showed no acute findings Additional history obtained from patient's spouse Previous records obtained and reviewed in epic including recent PCP and cardiology visits Cardiac monitoring reviewed, A-fib with controlled rate Social determinants considered, no significant barriers Critical Interventions: None  After the interventions stated above, I reevaluated the patient and found patient to be asymptomatic without any pain Admission and further testing considered, no indications for admission at this time.  No evidence of ACS.  Patient will follow-up with his PCP and cardiologist.  Return instructions discussed         Final Clinical Impression(s) / ED Diagnoses Final diagnoses:  Nonspecific chest pain    Rx / DC Orders ED Discharge Orders     None         Hayden Rasmussen, MD 01/14/23 1706

## 2023-01-14 NOTE — ED Triage Notes (Signed)
Pt c/o left shoulder pain that radiates to left side of chest; pt having some sob, nausea and chills with the pain  Pt describes the pain as pressure with occasional sharp pains

## 2023-01-14 NOTE — ED Notes (Signed)
Pt states he took 2 tylenol before he left home with no pain relief

## 2023-01-14 NOTE — Discharge Instructions (Signed)
You were seen in the emergency department for chest pain.  You had blood work EKG chest x-ray that did not show a definite cause of your symptoms.  Please keep your appointment on Friday as scheduled with cardiology.  Continue regular medications.  Return to the emergency department if any worsening or concerning symptoms.

## 2023-01-17 ENCOUNTER — Encounter: Payer: Self-pay | Admitting: Nurse Practitioner

## 2023-01-18 ENCOUNTER — Ambulatory Visit: Payer: PPO | Attending: Cardiology | Admitting: *Deleted

## 2023-01-18 DIAGNOSIS — Z5181 Encounter for therapeutic drug level monitoring: Secondary | ICD-10-CM

## 2023-01-18 DIAGNOSIS — I4891 Unspecified atrial fibrillation: Secondary | ICD-10-CM

## 2023-01-18 LAB — POCT INR: INR: 2 (ref 2.0–3.0)

## 2023-01-18 NOTE — Patient Instructions (Signed)
Increase warfarin to 1 tablet daily Recheck in 3 weeks 

## 2023-01-19 ENCOUNTER — Encounter: Payer: Self-pay | Admitting: Nurse Practitioner

## 2023-01-19 ENCOUNTER — Ambulatory Visit: Payer: PPO | Attending: Nurse Practitioner | Admitting: Nurse Practitioner

## 2023-01-19 VITALS — BP 139/76 | HR 91 | Ht 68.0 in | Wt 152.8 lb

## 2023-01-19 DIAGNOSIS — R079 Chest pain, unspecified: Secondary | ICD-10-CM

## 2023-01-19 DIAGNOSIS — I4821 Permanent atrial fibrillation: Secondary | ICD-10-CM | POA: Diagnosis not present

## 2023-01-19 DIAGNOSIS — I2510 Atherosclerotic heart disease of native coronary artery without angina pectoris: Secondary | ICD-10-CM | POA: Diagnosis not present

## 2023-01-19 NOTE — Patient Instructions (Addendum)
Medication Instructions:  Your physician recommends that you continue on your current medications as directed. Please refer to the Current Medication list given to you today.   Labwork: none  Testing/Procedures: none  Follow-Up:  Your physician recommends that you schedule a follow-up appointment in: Keep appointment in August with Dr. Diona Browner  Any Other Special Instructions Will Be Listed Below (If Applicable).  On Monday, please bring in EKG from PCP office.  If you need a refill on your cardiac medications before your next appointment, please call your pharmacy.

## 2023-01-19 NOTE — Progress Notes (Unsigned)
Office Visit    Patient Name: [name] Date of Encounter: 01/19/2023  PCP:  Estanislado PandySasser, Paul W, MD   [facility]  Cardiologist:  Nona DellSamuel McDowell, MD *** Advanced Practice Provider:  No care team member to display Electrophysiologist:  None  {Press F2 to show EP APP, CHF, sleep or structural heart MD               :[id number]}  { Click here to update then REFRESH NOTE - MD (PCP) or APP (Team Member)  Change PCP Type for MD, Specialty for APP is either Cardiology or Clinical Cardiac Electrophysiology  :[id number]}  Chief Complaint    [name] is a 89 y.o. male with a hx of CAD, permanent A-fib, hypothyroidism, and anemia, who presents today for chest pain evaluation.  Past Medical History    Past Medical History:  Diagnosis Date   Anemia    Atrial fibrillation    BPH (benign prostatic hyperplasia)    CAD (coronary artery disease)    Catheterization 2004, mild/moderate nonobstructive disease  /   nuclear, 2007, small inferior scar // no ischemia   Crohn's disease    Dysrhythmia    Elevated PSA    GERD (gastroesophageal reflux disease)    History of kidney stones    History of pneumonia    Hypothyroidism    Mitral regurgitation    Pneumonia    SBO (small bowel obstruction)    Urinary retention    Wears glasses    Past Surgical History:  Procedure Laterality Date   AGILE CAPSULE  10/18/2011   Procedure: AGILE CAPSULE;  Surgeon: Malissa HippoNajeeb U Rehman, MD;  [facility];  Service: Endoscopy;  Laterality: N/A;  730   BIOPSY  10/05/2022   Procedure: BIOPSY;  Surgeon: Dolores Frameastaneda Mayorga, Daniel, MD;  [facility];  Service: Gastroenterology;;   BIOPSY  01/02/2023   Procedure: BIOPSY;  Surgeon: Dolores Frameastaneda Mayorga, Daniel, MD;  [facility];  Service: Gastroenterology;;   CARDIAC CATHETERIZATION  2004   CATARACT EXTRACTION W/PHACO Right 06/27/2021   Procedure: CATARACT EXTRACTION PHACO AND INTRAOCULAR LENS PLACEMENT (IOC);   Surgeon: Fabio PierceWrzosek, James, MD;  [facility]: [facility];  Service: Ophthalmology;  Laterality: Right;  CDE 21.98   CATARACT EXTRACTION W/PHACO Left 07/11/2021   Procedure: CATARACT EXTRACTION PHACO AND INTRAOCULAR LENS PLACEMENT LEFT EYE;  Surgeon: Fabio PierceWrzosek, James, MD;  [facility]: [facility];  Service: Ophthalmology;  Laterality: Left;  CDE=10.42   CHOLECYSTECTOMY  2010   Dr. Gabriel CirrieMason   COLONOSCOPY  2008   DeMason   COLONOSCOPY N/A 03/23/2016   Procedure: COLONOSCOPY;  Surgeon: Malissa HippoNajeeb U Rehman, MD;  [facility];  Service: Endoscopy;  Laterality: N/A;  1:00   CYSTOSCOPY WITH INSERTION OF UROLIFT     ESOPHAGEAL BRUSHING  10/05/2022   Procedure: ESOPHAGEAL BRUSHING;  Surgeon: Dolores Frameastaneda Mayorga, Daniel, MD;  [facility];  Service: Gastroenterology;;   ESOPHAGOGASTRODUODENOSCOPY  11/08/2011   Procedure: ESOPHAGOGASTRODUODENOSCOPY (EGD);  Surgeon: Malissa HippoNajeeb U Rehman, MD;  [facility];  Service: Endoscopy;  Laterality: N/A;  300   ESOPHAGOGASTRODUODENOSCOPY (EGD) WITH PROPOFOL N/A 10/05/2022   Procedure: ESOPHAGOGASTRODUODENOSCOPY (EGD) WITH PROPOFOL;  Surgeon: Dolores Frameastaneda Mayorga, Daniel, MD;  [facility];  Service: Gastroenterology;  Laterality: N/A;   ESOPHAGOGASTRODUODENOSCOPY (EGD) WITH PROPOFOL N/A 01/02/2023   Procedure: ESOPHAGOGASTRODUODENOSCOPY (EGD) WITH PROPOFOL;  Surgeon: Dolores Frameastaneda Mayorga, Daniel, MD;  [facility];  Service: Gastroenterology;  Laterality: N/A;  1:30 pm,  asa 3   HEMOSTASIS CLIP PLACEMENT  10/05/2022   Procedure: HEMOSTASIS CLIP PLACEMENT;  Surgeon: Dolores Frame, MD;  [facility];  Service: Gastroenterology;;   POLYPECTOMY  03/23/2016   Procedure: POLYPECTOMY;  Surgeon: Malissa Hippo, MD;  [facility];  Service: Endoscopy;;  Cecal polyp removed via cold forceps recto-sigmoid polyp removed via cold snare   POLYPECTOMY  10/05/2022   Procedure: POLYPECTOMY;  Surgeon: Dolores Frame, MD;   [facility];  Service: Gastroenterology;;   TRANSURETHRAL RESECTION OF PROSTATE N/A 04/29/2020   Procedure: TRANSURETHRAL RESECTION OF THE PROSTATE (TURP);  Surgeon: Marcine Matar, MD;  Location: [facility];  Service: Urology;  Laterality: N/A;  90 MINS    Allergies  No Known Allergies  History of Present Illness    [name] is a 89 y.o. male with a PMH as mentioned above.  Hospital admission December/January 24 with multifocal pneumonia, rapid A-fib, had some acute blood loss anemia due to GI bleeding.  Underwent inpatient rehab, was doing outpatient PT.    Last seen by Dr. Diona Browner on November 23, 2022. Overall patient was doing well, denied chest pain or palpitations.  Did note some progressive leg edema.    Recently seen at [facility], ED for chest pain evaluation.  EKG showed A-fib with rate controlled. Chest x-ray showed nothing acute.  Lab work stable/unremarkable.  Troponins negative.  Patient was to be seen in the office for scheduled appointment.  Today he presents for chest pain evaluation.  He states EKGs/Labs/Other Studies Reviewed:   The following studies were reviewed today: ***  EKG:  EKG is *** ordered today.  The ekg ordered today demonstrates ***  Recent Labs: 05/28/2022: B Natriuretic Peptide 105.0 10/10/2022: Magnesium 1.4 10/29/2022: ALT 14 01/14/2023: BUN 19; Creatinine, Ser 1.43; Hemoglobin 11.4; Platelets 154; Potassium 3.4; Sodium 136  Recent Lipid Panel No results found for: "CHOL", "TRIG", "HDL", "CHOLHDL", "VLDL", "LDLCALC", "LDLDIRECT"  Risk Assessment/Calculations:  {Does this patient have ATRIAL FIBRILLATION?:[phone]}  Home Medications   Current Meds  Medication Sig   acetaminophen (TYLENOL) 500 MG tablet Take 500 mg by mouth every 6 (six) hours as needed for mild pain or moderate pain.   CALCIUM PO Take 300 mg by mouth in the morning and at bedtime.   carvedilol (COREG) 12.5 MG tablet Take 1 tablet (12.5  mg total) by mouth 2 (two) times daily.   Cholecalciferol (VITAMIN D3) 50 MCG (2000 UT) TABS Take 2,000 Units by mouth daily.   diltiazem (CARDIZEM CD) 180 MG 24 hr capsule Take 180 mg by mouth 2 (two) times daily.   ferrous sulfate (FEROSUL) 325 (65 FE) MG tablet Take 325 mg by mouth daily with breakfast.   levothyroxine (SYNTHROID, LEVOTHROID) 75 MCG tablet Take 75 mcg by mouth daily before breakfast.   magnesium oxide (MAG-OX) 400 (240 Mg) MG tablet Take 400 mg by mouth 2 (two) times daily.   Melatonin 10 MG TABS Take 10 mg by mouth at bedtime.   nitroGLYCERIN (NITROSTAT) 0.4 MG SL tablet Place 0.4 mg under the tongue every 5 (five) minutes as needed. For chest pains. May repeat for up to 3 doses.   pantoprazole (PROTONIX) 40 MG tablet Take 1 tablet (40 mg total) by mouth daily.   Probiotic Product (ALIGN) 4 MG CAPS Take 4 mg by mouth in the morning.   traZODone (DESYREL) 50 MG tablet Take 50 mg by mouth at bedtime.   ustekinumab (STELARA) 90 MG/ML  SOSY injection Inject 1 mL (90 mg total) into the skin every 8 (eight) weeks.   warfarin (COUMADIN) 2.5 MG tablet TAKE 1-1 &1/2 TABLETS BY MOUTH DAILY AS DIRECTED (Patient taking differently: Take 1.25-2.5 mg by mouth See admin instructions. Take 2.5 mg by mouth daily except Thursday take 1.25 mg)     Review of Systems   ***   All other systems reviewed and are otherwise negative except as noted above.  Physical Exam    VS:  BP (!) 142/70 (BP Location: Left Arm, Patient Position: Sitting, Cuff Size: Normal)   Pulse 91   Ht  (1.727 m)   Wt 152 lb 12.8 oz (69.3 kg)   SpO2 98%   BMI 23.23 kg/m  , BMI Body mass index is 23.23 kg/m.  Wt Readings from Last 3 Encounters:  01/19/23 152 lb 12.8 oz (69.3 kg)  01/14/23 156 lb 1.4 oz (70.8 kg)  01/02/23 156 lb 1.4 oz (70.8 kg)     GEN: Well nourished, well developed, in no acute distress. HEENT: normal. Neck: Supple, no JVD, carotid bruits, or masses. Cardiac: ***RRR, no murmurs, rubs,  or gallops. No clubbing, cyanosis, edema.  ***Radials/PT 2+ and equal bilaterally.  Respiratory:  ***Respirations regular and unlabored, clear to auscultation bilaterally. GI: Soft, nontender, nondistended. MS: No deformity or atrophy. Skin: Warm and dry, no rash. Neuro:  Strength and sensation are intact. Psych: Normal affect.  Assessment & Plan    ***  {Are you ordering a CV Procedure (e.g. stress test, cath, DCCV, TEE, etc)?   Press F2        :[id number]}      Disposition: Follow up {follow up:15908} with Nona Dell, MD or APP.  Signed, Sharlene Dory, NP 01/19/2023, 2:44 PM [facility]

## 2023-01-22 ENCOUNTER — Encounter: Payer: Self-pay | Admitting: Nurse Practitioner

## 2023-01-23 ENCOUNTER — Ambulatory Visit (INDEPENDENT_AMBULATORY_CARE_PROVIDER_SITE_OTHER): Payer: PPO | Admitting: Gastroenterology

## 2023-01-25 ENCOUNTER — Ambulatory Visit (INDEPENDENT_AMBULATORY_CARE_PROVIDER_SITE_OTHER): Payer: PPO | Admitting: Gastroenterology

## 2023-01-25 ENCOUNTER — Encounter (INDEPENDENT_AMBULATORY_CARE_PROVIDER_SITE_OTHER): Payer: Self-pay | Admitting: Gastroenterology

## 2023-01-25 VITALS — BP 156/73 | HR 80 | Temp 98.0°F | Ht 68.0 in | Wt 155.2 lb

## 2023-01-25 DIAGNOSIS — E7849 Other hyperlipidemia: Secondary | ICD-10-CM | POA: Diagnosis not present

## 2023-01-25 DIAGNOSIS — K50018 Crohn's disease of small intestine with other complication: Secondary | ICD-10-CM | POA: Diagnosis not present

## 2023-01-25 DIAGNOSIS — E559 Vitamin D deficiency, unspecified: Secondary | ICD-10-CM | POA: Diagnosis not present

## 2023-01-25 DIAGNOSIS — N183 Chronic kidney disease, stage 3 unspecified: Secondary | ICD-10-CM | POA: Diagnosis not present

## 2023-01-25 DIAGNOSIS — E7801 Familial hypercholesterolemia: Secondary | ICD-10-CM | POA: Diagnosis not present

## 2023-01-25 DIAGNOSIS — Z1159 Encounter for screening for other viral diseases: Secondary | ICD-10-CM

## 2023-01-25 DIAGNOSIS — Z9289 Personal history of other medical treatment: Secondary | ICD-10-CM | POA: Diagnosis not present

## 2023-01-25 DIAGNOSIS — E039 Hypothyroidism, unspecified: Secondary | ICD-10-CM | POA: Diagnosis not present

## 2023-01-25 DIAGNOSIS — E782 Mixed hyperlipidemia: Secondary | ICD-10-CM | POA: Diagnosis not present

## 2023-01-25 DIAGNOSIS — E7800 Pure hypercholesterolemia, unspecified: Secondary | ICD-10-CM | POA: Diagnosis not present

## 2023-01-25 LAB — LAB REPORT - SCANNED: EGFR: 42

## 2023-01-25 NOTE — Patient Instructions (Addendum)
We will continue with stelara at current dosage and update some labs today Continue protonix 40mg  daily please let me know if you have any new or worsening GI symptoms.   Follow up 2 months

## 2023-01-25 NOTE — Progress Notes (Signed)
Referring Provider: Estanislado Pandy, MD Primary Care Physician:  Estanislado Pandy, MD Primary GI Physician: Levon Hedger   Chief Complaint  Patient presents with   Crohn's Disease    Follow up on Crohns. Reports doing well.    HPI:   [name] is a 89 y.o. male with past medical history of  small bowel Crohn's disease status post resection complicated by recurrent stricture on Humira, C. difficile colitis, BPH, coronary artery disease, atrial fibrillation, GERD, hypothyroidism   Patient presenting today for follow up of Crohn's disease.  Lat seen January 2024. Patient underwent EGD in December 2023 where he was found to have grade D esophagitis, also presence of nodularity in the second portion of the duodenum which came back positive for a well-differentiated neuroendocrine tumor, there was a small polypoid area which were resected and clipped x 1 this tissue was the accessory duct.  The patient did not present any abdominal pain or discomfort after undergoing resection. pathology discussed with the family members but the patient was not aware of the carcinoid diagnosis until his visit in January. He was recommended to stop Humira and start stelara due to the above findings on EGD.   Recommended to have chromagranin A level (1329), CT A/P with contrast, repeat EGD in March (as below).   Present:  Had first Stelara infusion on 1/15, had first SQ injection on 12/25/22, next injection after that due on 02/19/23, per patient's wife.   Patient reports he is doing well GI wise. He has a BM usually daily with mostly looser stools, occasional solid stools. Feels stools have been this consistency even prior to starting Stelara. He does have darker stools due to PO iron, no BRBPR. Appetite is good, no weight loss.  He had labs done this morning with his PCP. He has no abdominal pain, nausea or vomiting. No fevers, chills, or respiratory symptoms. No skin lesions or rashes.   Last Hep B testing was may  2023, TB quant done in 2022 Last CRP in August 2023 was 1.7, no recent fecal calprotectin    CT A/P with contrast 12/22/22: No acute intra-abdominal or pelvic pathology. 2. No hydronephrosis or nephrolithiasis. 3. Sigmoid diverticulosis. No bowel obstruction. Normal appendix. 4. Partially visualized small bilateral pleural effusions with associated minimal compressive atelectasis of the lower lobes.   Last EGD: 01/02/23 - Discolored mucosa in the esophagus. Biopsied.                           - 2 cm hiatal hernia.                           - Normal stomach.                           - Submucosal nodule - NET found in the duodenum.                            Slightly larger in size. Biopsied.                           - Buried metallic clip in between layers of second  portion of small bowel. Last Colonoscopy: 03/23/2016 - Two small polyps at the recto-sigmoid colon and in the cecum, removed with a cold snare. Resected and retrieved. - Mild diverticulosis in the sigmoid colon. - External and internal hemorrhoids  Repeat EGD 1 year  Past Medical History:  Diagnosis Date   Anemia    Atrial fibrillation    BPH (benign prostatic hyperplasia)    CAD (coronary artery disease)    Catheterization 2004, mild/moderate nonobstructive disease  /   nuclear, 2007, small inferior scar // no ischemia   Crohn's disease    Dysrhythmia    Elevated PSA    GERD (gastroesophageal reflux disease)    History of kidney stones    History of pneumonia    Hypothyroidism    Mitral regurgitation    Pneumonia    SBO (small bowel obstruction)    Urinary retention    Wears glasses     Past Surgical History:  Procedure Laterality Date   AGILE CAPSULE  10/18/2011   Procedure: AGILE CAPSULE;  Surgeon: Malissa Hippo, MD;  [facility];  Service: Endoscopy;  Laterality: N/A;  730   BIOPSY  10/05/2022   Procedure: BIOPSY;  Surgeon: Dolores Frame, MD;  [facility];  Service: Gastroenterology;;   BIOPSY  01/02/2023   Procedure: BIOPSY;  Surgeon: Dolores Frame, MD;  [facility];  Service: Gastroenterology;;   CARDIAC CATHETERIZATION  2004   CATARACT EXTRACTION W/PHACO Right 06/27/2021   Procedure: CATARACT EXTRACTION PHACO AND INTRAOCULAR LENS PLACEMENT (IOC);  Surgeon: Fabio Pierce, MD;  [facility];  Service: Ophthalmology;  Laterality: Right;  CDE 21.98   CATARACT EXTRACTION W/PHACO Left 07/11/2021   Procedure: CATARACT EXTRACTION PHACO AND INTRAOCULAR LENS PLACEMENT LEFT EYE;  Surgeon: Fabio Pierce, MD;  [facility]: [facility];  Service: Ophthalmology;  Laterality: Left;  CDE=10.42   CHOLECYSTECTOMY  2010   Dr. Gabriel Cirri   COLONOSCOPY  2008   DeMason   COLONOSCOPY N/A 03/23/2016   Procedure: COLONOSCOPY;  Surgeon: Malissa Hippo, MD;  [facility];  Service: Endoscopy;  Laterality: N/A;  1:00   CYSTOSCOPY WITH INSERTION OF UROLIFT     ESOPHAGEAL BRUSHING  10/05/2022   Procedure: ESOPHAGEAL BRUSHING;  Surgeon: Dolores Frame, MD;  [facility];  Service: Gastroenterology;;   ESOPHAGOGASTRODUODENOSCOPY  11/08/2011   Procedure: ESOPHAGOGASTRODUODENOSCOPY (EGD);  Surgeon: Malissa Hippo, MD;  [facility];  Service: Endoscopy;  Laterality: N/A;  300   ESOPHAGOGASTRODUODENOSCOPY (EGD) WITH PROPOFOL N/A 10/05/2022   Procedure: ESOPHAGOGASTRODUODENOSCOPY (EGD) WITH PROPOFOL;  Surgeon: Dolores Frame, MD;  [facility];  Service: Gastroenterology;  Laterality: N/A;   ESOPHAGOGASTRODUODENOSCOPY (EGD) WITH PROPOFOL N/A 01/02/2023   Procedure: ESOPHAGOGASTRODUODENOSCOPY (EGD) WITH PROPOFOL;  Surgeon: Dolores Frame, MD;  [facility];  Service: Gastroenterology;  Laterality: N/A;  1:30 pm, asa 3   HEMOSTASIS CLIP PLACEMENT  10/05/2022   Procedure: HEMOSTASIS CLIP PLACEMENT;  Surgeon: Dolores Frame, MD;  [facility];   Service: Gastroenterology;;   POLYPECTOMY  03/23/2016   Procedure: POLYPECTOMY;  Surgeon: Malissa Hippo, MD;  [facility];  Service: Endoscopy;;  Cecal polyp removed via cold forceps recto-sigmoid polyp removed via cold snare   POLYPECTOMY  10/05/2022   Procedure: POLYPECTOMY;  Surgeon: Dolores Frame, MD;  [facility];  Service: Gastroenterology;;   TRANSURETHRAL RESECTION OF PROSTATE N/A 04/29/2020   Procedure: TRANSURETHRAL RESECTION OF  THE PROSTATE (TURP);  Surgeon: Marcine Matarahlstedt, Stephen, MD;  [facility];  Service: Urology;  Laterality: N/A;  90 MINS    Current Outpatient Medications  Medication Sig Dispense Refill   acetaminophen (TYLENOL) 500 MG tablet Take 500 mg by mouth every 6 (six) hours as needed for mild pain or moderate pain.     CALCIUM PO Take 300 mg by mouth in the morning and at bedtime.     carvedilol (COREG) 12.5 MG tablet Take 1 tablet (12.5 mg total) by mouth 2 (two) times daily. 60 tablet 0   Cholecalciferol (VITAMIN D3) 50 MCG (2000 UT) TABS Take 2,000 Units by mouth daily.     diltiazem (CARDIZEM CD) 180 MG 24 hr capsule Take 180 mg by mouth 2 (two) times daily.     ferrous sulfate (FEROSUL) 325 (65 FE) MG tablet Take 325 mg by mouth daily with breakfast.     levothyroxine (SYNTHROID, LEVOTHROID) 75 MCG tablet Take 75 mcg by mouth daily before breakfast.     magnesium oxide (MAG-OX) 400 (240 Mg) MG tablet Take 400 mg by mouth 2 (two) times daily.     Melatonin 10 MG TABS Take 10 mg by mouth at bedtime.     nitroGLYCERIN (NITROSTAT) 0.4 MG SL tablet Place 0.4 mg under the tongue every 5 (five) minutes as needed. For chest pains. May repeat for up to 3 doses.     pantoprazole (PROTONIX) 40 MG tablet Take 1 tablet (40 mg total) by mouth daily. 90 tablet 3   Probiotic Product (ALIGN) 4 MG CAPS Take 4 mg by mouth in the morning.     traZODone (DESYREL) 50 MG tablet Take 50 mg by mouth at bedtime.     ustekinumab  (STELARA) 90 MG/ML SOSY injection Inject 1 mL (90 mg total) into the skin every 8 (eight) weeks. 1 mL 6   warfarin (COUMADIN) 2.5 MG tablet TAKE 1-1 &1/2 TABLETS BY MOUTH DAILY AS DIRECTED (Patient taking differently: Take 1.25-2.5 mg by mouth See admin instructions. Take 2.5 mg by mouth daily except Thursday take 1.25 mg) 45 tablet 6   No current facility-administered medications for this visit.    Allergies as of 01/25/2023   (No Known Allergies)    Family History  Problem Relation Age of Onset   Colon cancer Mother    Heart disease Father    Stroke Brother    Healthy Daughter     Social History   Socioeconomic History   Marital status: Married    Spouse name: Not on file   Number of children: Not on file   Years of education: Not on file   Highest education level: Not on file  Occupational History   Occupation: Public house manageretired-Hospital Director     Employer: RETIRED  Tobacco Use   Smoking status: Never    Passive exposure: Never   Smokeless tobacco: Never  Vaping Use   Vaping Use: Never used  Substance and Sexual Activity   Alcohol use: No    Alcohol/week: 0.0 standard drinks of alcohol   Drug use: No   Sexual activity: Not on file  Other Topics Concern   Not on file  Social History Narrative   Married   Social Determinants of Health   Financial Resource Strain: Not on file  Food Insecurity: No Food Insecurity (10/02/2022)   Hunger Vital Sign    Worried About Running Out of Food in the Last Year: Never true    Ran Out of Food  in the Last Year: Never true  Transportation Needs: No Transportation Needs (10/02/2022)   PRAPARE - Administrator, Civil Service (Medical): No    Lack of Transportation (Non-Medical): No  Physical Activity: Not on file  Stress: Not on file  Social Connections: Not on file    Review of systems General: negative for malaise, night sweats, fever, chills, weight loss Neck: Negative for lumps, goiter, pain and significant neck  swelling Resp: Negative for cough, wheezing, dyspnea at rest CV: Negative for chest pain, leg swelling, palpitations, orthopnea GI: denies melena, hematochezia, nausea, vomiting, diarrhea, constipation, dysphagia, odyonophagia, early satiety or unintentional weight loss.  MSK: Negative for joint pain or swelling, back pain, and muscle pain. Derm: Negative for itching or rash Psych: Denies depression, anxiety, memory loss, confusion. No homicidal or suicidal ideation.  Heme: Negative for prolonged bleeding, bruising easily, and swollen nodes. Endocrine: Negative for cold or heat intolerance, polyuria, polydipsia and goiter. Neuro: negative for tremor, gait imbalance, syncope and seizures. The remainder of the review of systems is noncontributory.  Physical Exam: There were no vitals taken for this visit. General:   Alert and oriented. No distress noted. Pleasant and cooperative.  Head:  Normocephalic and atraumatic. Eyes:  Conjuctiva clear without scleral icterus. Mouth:  Oral mucosa pink and moist. Good dentition. No lesions. Heart: Normal rate and rhythm, s1 and s2 heart sounds present.  Lungs: Clear lung sounds in all lobes. Respirations equal and unlabored. Abdomen:  +BS, soft, non-tender and non-distended. No rebound or guarding. No HSM or masses noted. Derm: No palmar erythema or jaundice Msk:  Symmetrical without gross deformities. Normal posture. Extremities:  Without edema. Neurologic:  Alert and  oriented x4 Psych:  Alert and cooperative. Normal mood and affect.  Invalid input(s): "6 MONTHS"   ASSESSMENT: [name] is a 89 y.o. male presenting today for follow up since induction of Stelara.  Patient had Stelara induction on 1/15. He is doing well. Having 1 looser/sometimes solid stool daily. No rectal bleeding or abdominal pain. Denies any side effects from the medication. He is due for Hep B and TB quant testing, will also update Fecal calprotectin and CRP. He had basic  labs done at PCP this morning so we will obtain these for our records. Will consider Colonoscopy around July if he is still doing well to evaluate for endoscopic remission, as this will be 6 months after induction of Stelara.    PLAN:  Hep BsAg, TB quant, Fecal cal, CRP   2. Continue with Stelara [place] injections Q8W 3. Consider Repeat Colonoscopy July 2024 4. Continue protonix  daily 5. Obtain labs from PCP  All questions were answered, patient verbalized understanding and is in agreement with plan as outlined above.   Follow Up: 2 months   Trinidee Schrag L. Jeanmarie Hubert, MSN, APRN, AGNP-C Adult-Gerontology Nurse Practitioner [facility]  I have reviewed the note and agree with the APP's assessment as described in this progress note  Katrinka Blazing, MD Gastroenterology and Hepatology [facility]

## 2023-01-26 LAB — C-REACTIVE PROTEIN: CRP: 3.1 mg/L (ref <8.0)

## 2023-01-27 LAB — QUANTIFERON-TB GOLD PLUS
Mitogen-NIL: >10 IU/mL
NIL: 0.07 IU/mL
QuantiFERON-TB Gold Plus: NEGATIVE
TB1-NIL: <0 IU/mL
TB2-NIL: <0 IU/mL

## 2023-01-27 LAB — HEPATITIS B SURFACE ANTIGEN: Hepatitis B Surface Ag: NONREACTIVE

## 2023-01-29 DIAGNOSIS — Z85828 Personal history of other malignant neoplasm of skin: Secondary | ICD-10-CM | POA: Diagnosis not present

## 2023-01-29 DIAGNOSIS — L570 Actinic keratosis: Secondary | ICD-10-CM | POA: Diagnosis not present

## 2023-01-29 DIAGNOSIS — D485 Neoplasm of uncertain behavior of skin: Secondary | ICD-10-CM | POA: Diagnosis not present

## 2023-01-29 DIAGNOSIS — Z1159 Encounter for screening for other viral diseases: Secondary | ICD-10-CM | POA: Diagnosis not present

## 2023-01-29 DIAGNOSIS — Z9289 Personal history of other medical treatment: Secondary | ICD-10-CM | POA: Diagnosis not present

## 2023-01-29 DIAGNOSIS — Z1283 Encounter for screening for malignant neoplasm of skin: Secondary | ICD-10-CM | POA: Diagnosis not present

## 2023-01-29 DIAGNOSIS — K50018 Crohn's disease of small intestine with other complication: Secondary | ICD-10-CM | POA: Diagnosis not present

## 2023-01-29 DIAGNOSIS — D0422 Carcinoma in situ of skin of left ear and external auricular canal: Secondary | ICD-10-CM | POA: Diagnosis not present

## 2023-01-29 DIAGNOSIS — D239 Other benign neoplasm of skin, unspecified: Secondary | ICD-10-CM | POA: Diagnosis not present

## 2023-02-01 DIAGNOSIS — R42 Dizziness and giddiness: Secondary | ICD-10-CM | POA: Diagnosis not present

## 2023-02-01 DIAGNOSIS — K56600 Partial intestinal obstruction, unspecified as to cause: Secondary | ICD-10-CM | POA: Diagnosis not present

## 2023-02-01 DIAGNOSIS — R531 Weakness: Secondary | ICD-10-CM | POA: Diagnosis not present

## 2023-02-01 DIAGNOSIS — E039 Hypothyroidism, unspecified: Secondary | ICD-10-CM | POA: Diagnosis not present

## 2023-02-01 DIAGNOSIS — D649 Anemia, unspecified: Secondary | ICD-10-CM | POA: Diagnosis not present

## 2023-02-01 DIAGNOSIS — N1832 Chronic kidney disease, stage 3b: Secondary | ICD-10-CM | POA: Diagnosis not present

## 2023-02-01 DIAGNOSIS — E7801 Familial hypercholesterolemia: Secondary | ICD-10-CM | POA: Diagnosis not present

## 2023-02-01 DIAGNOSIS — N1330 Unspecified hydronephrosis: Secondary | ICD-10-CM | POA: Diagnosis not present

## 2023-02-01 DIAGNOSIS — J189 Pneumonia, unspecified organism: Secondary | ICD-10-CM | POA: Diagnosis not present

## 2023-02-01 DIAGNOSIS — R7301 Impaired fasting glucose: Secondary | ICD-10-CM | POA: Diagnosis not present

## 2023-02-01 DIAGNOSIS — I10 Essential (primary) hypertension: Secondary | ICD-10-CM | POA: Diagnosis not present

## 2023-02-03 LAB — CALPROTECTIN: Calprotectin: 239 mcg/g — ABNORMAL HIGH

## 2023-02-05 ENCOUNTER — Telehealth (INDEPENDENT_AMBULATORY_CARE_PROVIDER_SITE_OTHER): Payer: Self-pay | Admitting: *Deleted

## 2023-02-05 ENCOUNTER — Ambulatory Visit: Payer: PPO | Attending: Cardiology | Admitting: *Deleted

## 2023-02-05 DIAGNOSIS — I4891 Unspecified atrial fibrillation: Secondary | ICD-10-CM

## 2023-02-05 DIAGNOSIS — Z5181 Encounter for therapeutic drug level monitoring: Secondary | ICD-10-CM | POA: Diagnosis not present

## 2023-02-05 LAB — POCT INR: INR: 2.7 (ref 2.0–3.0)

## 2023-02-05 NOTE — Telephone Encounter (Signed)
Called pt and he states he was having a lot of acid reflux and started bid dosing and doing better since then. He will mail copy of labs to Dr. Levon Hedger

## 2023-02-05 NOTE — Telephone Encounter (Signed)
Patient left voicmail that Dr. Neita Carp increased his protonix from once a day to one bid. And also had some recent labs with pcp that he will mail to [place] if we did not receive a copy.   [phone]

## 2023-02-05 NOTE — Telephone Encounter (Signed)
Thanks Toniann Fail, it should be OK to increase his PPI to twice a day. Will follow this on follow up appointment.

## 2023-02-05 NOTE — Telephone Encounter (Signed)
Patient notified ok to take PPI twice daily. Will hold message til we receive his lab work in mail. He told me he would put in mail tomorrow.

## 2023-02-05 NOTE — Patient Instructions (Signed)
Increase warfarin to 1 tablet daily  Recheck in 4 weeks 

## 2023-02-09 NOTE — Telephone Encounter (Signed)
Received labs from 01/25/2023, CBC showed WBC 5.9, hemoglobin 12.5, platelets 185, CMP with sodium 141, potassium 3.9, AST 9, alkaline phosphatase 48, albumin 4.2, ALT 6.0, creatinine 1.56, vitamin D was 39.5, TSH was 5.6.  Overall, labs look better and stable.  Please let him know about this.

## 2023-02-09 NOTE — Telephone Encounter (Signed)
Labs were received in the mail and given to Dr. Salena Saner to review.

## 2023-02-12 DIAGNOSIS — H43393 Other vitreous opacities, bilateral: Secondary | ICD-10-CM | POA: Diagnosis not present

## 2023-02-12 NOTE — Telephone Encounter (Signed)
Patient notified per Dr. Levon Hedger - Overall, labs look better and stable.  Pt verbalized understanding.

## 2023-02-15 ENCOUNTER — Telehealth: Payer: Self-pay | Admitting: Cardiology

## 2023-02-15 DIAGNOSIS — L905 Scar conditions and fibrosis of skin: Secondary | ICD-10-CM | POA: Diagnosis not present

## 2023-02-15 DIAGNOSIS — D485 Neoplasm of uncertain behavior of skin: Secondary | ICD-10-CM | POA: Diagnosis not present

## 2023-02-15 NOTE — Telephone Encounter (Signed)
Wife called again wanting to know if patient should be taking the Bactrim and is concerned this may affect his warfarin.  Wife wants a call back to know if patient should take his warfarin tomorrow (5/3).

## 2023-02-15 NOTE — Telephone Encounter (Signed)
Pt c/o medication issue:  1. Name of Medication: warfarin (COUMADIN) 2.5 MG tablet   2. How are you currently taking this medication (dosage and times per day)? As prescribed   3. Are you having a reaction (difficulty breathing--STAT)?   4. What is your medication issue? Patient is about to start Bactrim DS 1/2 at dinner for 6 days and was suggested that warfarin shouldn't be taken with this medication.

## 2023-02-16 NOTE — Telephone Encounter (Signed)
Called and spoke with pt. Confirmed pt started Bactrim last night, 02/15/23 and made pt aware that Bactrim CAN cause INR to be elevated. Scheduled pt an appt with [facility] in [place] on Monday 02/19/23 at 11:45am in [place] and advised pt to eat an extra serving of greens over the weekend. Pt verbalized understanding.

## 2023-02-19 ENCOUNTER — Ambulatory Visit: Payer: PPO | Attending: Cardiology | Admitting: *Deleted

## 2023-02-19 DIAGNOSIS — I4891 Unspecified atrial fibrillation: Secondary | ICD-10-CM

## 2023-02-19 DIAGNOSIS — Z5181 Encounter for therapeutic drug level monitoring: Secondary | ICD-10-CM

## 2023-02-19 LAB — POCT INR: INR: 3.7 — AB (ref 2.0–3.0)

## 2023-02-19 NOTE — Patient Instructions (Signed)
Hold warfarin tonight, take 1/2 tablet tomorrow night then resume 1 tablet daily Recheck in 2 weeks

## 2023-03-01 DIAGNOSIS — C44229 Squamous cell carcinoma of skin of left ear and external auricular canal: Secondary | ICD-10-CM | POA: Diagnosis not present

## 2023-03-05 ENCOUNTER — Ambulatory Visit: Payer: PPO | Attending: Cardiology | Admitting: *Deleted

## 2023-03-05 DIAGNOSIS — Z5181 Encounter for therapeutic drug level monitoring: Secondary | ICD-10-CM | POA: Diagnosis not present

## 2023-03-05 DIAGNOSIS — I4891 Unspecified atrial fibrillation: Secondary | ICD-10-CM | POA: Diagnosis not present

## 2023-03-05 LAB — POCT INR: INR: 3.4 — AB (ref 2.0–3.0)

## 2023-03-05 NOTE — Patient Instructions (Signed)
Hold warfarin tonight then decrease dose to 1 tablet daily except 1/2 tablet on Tuesdays Recheck in 2 weeks 

## 2023-03-08 DIAGNOSIS — H26493 Other secondary cataract, bilateral: Secondary | ICD-10-CM | POA: Diagnosis not present

## 2023-03-16 DIAGNOSIS — M545 Low back pain, unspecified: Secondary | ICD-10-CM | POA: Diagnosis not present

## 2023-03-16 DIAGNOSIS — M6281 Muscle weakness (generalized): Secondary | ICD-10-CM | POA: Diagnosis not present

## 2023-03-16 DIAGNOSIS — M25551 Pain in right hip: Secondary | ICD-10-CM | POA: Diagnosis not present

## 2023-03-16 DIAGNOSIS — R2681 Unsteadiness on feet: Secondary | ICD-10-CM | POA: Diagnosis not present

## 2023-03-19 ENCOUNTER — Telehealth: Payer: Self-pay | Admitting: Cardiology

## 2023-03-19 ENCOUNTER — Ambulatory Visit: Payer: PPO | Attending: Cardiology | Admitting: *Deleted

## 2023-03-19 DIAGNOSIS — Z5181 Encounter for therapeutic drug level monitoring: Secondary | ICD-10-CM | POA: Diagnosis not present

## 2023-03-19 DIAGNOSIS — I4891 Unspecified atrial fibrillation: Secondary | ICD-10-CM

## 2023-03-19 DIAGNOSIS — Z79899 Other long term (current) drug therapy: Secondary | ICD-10-CM

## 2023-03-19 DIAGNOSIS — M7989 Other specified soft tissue disorders: Secondary | ICD-10-CM

## 2023-03-19 LAB — POCT INR: INR: 2.6 (ref 2.0–3.0)

## 2023-03-19 MED ORDER — WARFARIN SODIUM 2.5 MG PO TABS: ORAL_TABLET | ORAL | 5 refills | Status: DC

## 2023-03-19 NOTE — Patient Instructions (Signed)
Continue warfarin 1 tablet daily except 1/2 tablet on Tuesdays °Recheck in 4 weeks °

## 2023-03-19 NOTE — Telephone Encounter (Signed)
Reports swelling in feet and ankles that started in January 2024. Says he was given furosemide to take for a few days in in February which was prescribed by [facility]. Reports furosemide didn't really help swelling in legs and feet. Denies chest pain, SOB or dizziness. Reports weighs daily and weight is stable staying around 155 lbs. Advised that message would be sent to provider and he would be contacted tomorrow with a response. Verbalized understanding of plan.

## 2023-03-19 NOTE — Telephone Encounter (Signed)
Pt came in to see Misty Stanley and stated he's still having feet and ankle swelling would like to know what he needs to do cause the medication is not helping   Please give pt a call this afternoon- he has another apt this morning

## 2023-03-20 ENCOUNTER — Other Ambulatory Visit: Payer: Self-pay | Admitting: *Deleted

## 2023-03-20 DIAGNOSIS — M79671 Pain in right foot: Secondary | ICD-10-CM | POA: Diagnosis not present

## 2023-03-20 DIAGNOSIS — M79672 Pain in left foot: Secondary | ICD-10-CM | POA: Diagnosis not present

## 2023-03-20 DIAGNOSIS — M79675 Pain in left toe(s): Secondary | ICD-10-CM | POA: Diagnosis not present

## 2023-03-20 DIAGNOSIS — M7989 Other specified soft tissue disorders: Secondary | ICD-10-CM

## 2023-03-20 DIAGNOSIS — Z79899 Other long term (current) drug therapy: Secondary | ICD-10-CM

## 2023-03-20 DIAGNOSIS — M79674 Pain in right toe(s): Secondary | ICD-10-CM | POA: Diagnosis not present

## 2023-03-20 DIAGNOSIS — I739 Peripheral vascular disease, unspecified: Secondary | ICD-10-CM | POA: Diagnosis not present

## 2023-03-20 DIAGNOSIS — L110 Acquired keratosis follicularis: Secondary | ICD-10-CM | POA: Diagnosis not present

## 2023-03-20 MED ORDER — FUROSEMIDE 40 MG PO TABS: 40.0000 mg | ORAL_TABLET | Freq: Every day | ORAL | 3 refills | Status: DC | PRN

## 2023-03-20 NOTE — Telephone Encounter (Signed)
Patient informed and verbalized understanding of plan. Lab order arrange for [facility] ([address] [place]. [place])

## 2023-03-21 DIAGNOSIS — M25551 Pain in right hip: Secondary | ICD-10-CM | POA: Diagnosis not present

## 2023-03-21 DIAGNOSIS — M545 Low back pain, unspecified: Secondary | ICD-10-CM | POA: Diagnosis not present

## 2023-03-21 DIAGNOSIS — R2681 Unsteadiness on feet: Secondary | ICD-10-CM | POA: Diagnosis not present

## 2023-03-21 DIAGNOSIS — M6281 Muscle weakness (generalized): Secondary | ICD-10-CM | POA: Diagnosis not present

## 2023-03-26 ENCOUNTER — Other Ambulatory Visit: Payer: Self-pay | Admitting: *Deleted

## 2023-03-26 ENCOUNTER — Telehealth: Payer: Self-pay | Admitting: Cardiology

## 2023-03-26 DIAGNOSIS — Z79899 Other long term (current) drug therapy: Secondary | ICD-10-CM

## 2023-03-26 DIAGNOSIS — M7989 Other specified soft tissue disorders: Secondary | ICD-10-CM

## 2023-03-26 NOTE — Telephone Encounter (Signed)
  Pt c/o medication issue:  1. Name of Medication:   furosemide (LASIX) 40 MG tablet    2. How are you currently taking this medication (dosage and times per day)? As written   3. Are you having a reaction (difficulty breathing--STAT)? no  4. What is your medication issue? Pt called in stating he was told to c/b in 3 days after starting this med.

## 2023-03-26 NOTE — Telephone Encounter (Signed)
Reports swelling in legs and feet is now gone. Says he will do lab work tomorrow at [facility] since he has GI appointment. Order updated.

## 2023-03-27 ENCOUNTER — Ambulatory Visit (INDEPENDENT_AMBULATORY_CARE_PROVIDER_SITE_OTHER): Payer: PPO | Admitting: Gastroenterology

## 2023-03-27 ENCOUNTER — Encounter (INDEPENDENT_AMBULATORY_CARE_PROVIDER_SITE_OTHER): Payer: Self-pay | Admitting: Gastroenterology

## 2023-03-27 VITALS — BP 140/64 | HR 68 | Temp 97.8°F | Ht 68.0 in | Wt 155.0 lb

## 2023-03-27 DIAGNOSIS — K50018 Crohn's disease of small intestine with other complication: Secondary | ICD-10-CM

## 2023-03-27 DIAGNOSIS — Z9289 Personal history of other medical treatment: Secondary | ICD-10-CM | POA: Diagnosis not present

## 2023-03-27 NOTE — Progress Notes (Signed)
Referring Provider: Estanislado Pandy, MD Primary Care Physician:  Estanislado Pandy, MD Primary GI Physician: Levon Hedger    Chief Complaint  Patient presents with   Crohn's Disease    Follow up on Crohn's. Has had stelara infusion and one injection. Will take 2nd injection of stelara on July 1st. Reports no concerns.    HPI:   [name] is a 89 y.o. male with past medical history of small bowel Crohn's disease status post resection complicated by recurrent stricture on Humira, C. difficile colitis, BPH, coronary artery disease, atrial fibrillation, GERD, hypothyroidism   Patient presenting today for follow up of Crohn's disease  Last seen April 2024, at that time, had infusion and one injection of Stelara. Patient doing well. Having 1 BM per day with mostly looser stools. No rectal bleeding. Stools darker on iron.   Recommended to continue with Stelara, check Hep B, TB, fecal cal, CRP, continue protonix, consider repeat Colonoscopy July 2024.   Calprotectin in April was 239, CRP 3.1  Hep B, TB negative  Present:  first Stelara infusion on 10/30/22  first SQ injection 12/25/22, 2nd injection 02/19/23, Upcoming injection 7/1, 5th dose 8/26   Patient reports he is doing pretty good on stelara. No abdominal pain. He is having one BM per day to every other day. He has a lot of gas. Notes stools are formed but sometimes more harder stool balls. Wife reports they use a dulcolax suppository at times which helps.  No loose stools currently. No rectal bleeding. Stools darker on iron pills. Appetite is good. No nausea or vomiting.   He is having some issues with his arthritis and some swelling in his LEs that he was given lasix for by cardiology which has helped. No rashes or skin lesions. No changes in vision. He had recent cataract surgery, and sees his eye doctor regularly. No cough, fevers, sore throat.    CT A/P with contrast 12/22/22: No acute intra-abdominal or pelvic pathology. 2. No  hydronephrosis or nephrolithiasis. 3. Sigmoid diverticulosis. No bowel obstruction. Normal appendix. 4. Partially visualized small bilateral pleural effusions with associated minimal compressive atelectasis of the lower lobes.   Last EGD: 01/02/23 - Discolored mucosa in the esophagus. Biopsied.                           - 2 cm hiatal hernia.                           - Normal stomach.                           - Submucosal nodule - NET found in the duodenum.                            Slightly larger in size. Biopsied.                           - Buried metallic clip in between layers of second                            portion of small bowel. Last Colonoscopy: 03/23/2016 - Two small polyps at the recto-sigmoid colon and in the cecum, removed with a cold snare. Resected and retrieved. -  Mild diverticulosis in the sigmoid colon. - External and internal hemorrhoids   Repeat EGD 1 year   Past Medical History:  Diagnosis Date   Anemia    Atrial fibrillation (HCC)    BPH (benign prostatic hyperplasia)    CAD (coronary artery disease)    Catheterization 2004, mild/moderate nonobstructive disease  /   nuclear, 2007, small inferior scar // no ischemia   Crohn's disease (HCC)    Dysrhythmia    Elevated PSA    GERD (gastroesophageal reflux disease)    History of kidney stones    History of pneumonia    Hypothyroidism    Mitral regurgitation    Pneumonia    SBO (small bowel obstruction) (HCC)    Urinary retention    Wears glasses     Past Surgical History:  Procedure Laterality Date   AGILE CAPSULE  10/18/2011   Procedure: AGILE CAPSULE;  Surgeon: Malissa Hippo, MD;  [facility];  Service: Endoscopy;  Laterality: N/A;  730   BIOPSY  10/05/2022   Procedure: BIOPSY;  Surgeon: Dolores Frame, MD;  [facility];  Service: Gastroenterology;;   BIOPSY  01/02/2023   Procedure: BIOPSY;  Surgeon: Dolores Frame, MD;  [facility];   Service: Gastroenterology;;   CARDIAC CATHETERIZATION  2004   CATARACT EXTRACTION W/PHACO Right 06/27/2021   Procedure: CATARACT EXTRACTION PHACO AND INTRAOCULAR LENS PLACEMENT (IOC);  Surgeon: Fabio Pierce, MD;  Location: [facility];  Service: Ophthalmology;  Laterality: Right;  CDE 21.98   CATARACT EXTRACTION W/PHACO Left 07/11/2021   Procedure: CATARACT EXTRACTION PHACO AND INTRAOCULAR LENS PLACEMENT LEFT EYE;  Surgeon: Fabio Pierce, MD;  Location: [facility];  Service: Ophthalmology;  Laterality: Left;  CDE=10.42   CHOLECYSTECTOMY  2010   Dr. Gabriel Cirri   COLONOSCOPY  2008   [facility]   COLONOSCOPY N/A 03/23/2016   Procedure: COLONOSCOPY;  Surgeon: Malissa Hippo, MD;  [facility];  Service: Endoscopy;  Laterality: N/A;  1:00   CYSTOSCOPY WITH INSERTION OF UROLIFT     ESOPHAGEAL BRUSHING  10/05/2022   Procedure: ESOPHAGEAL BRUSHING;  Surgeon: Dolores Frame, MD;  [facility];  Service: Gastroenterology;;   ESOPHAGOGASTRODUODENOSCOPY  11/08/2011   Procedure: ESOPHAGOGASTRODUODENOSCOPY (EGD);  Surgeon: Malissa Hippo, MD;  [facility];  Service: Endoscopy;  Laterality: N/A;  300   ESOPHAGOGASTRODUODENOSCOPY (EGD) WITH PROPOFOL N/A 10/05/2022   Procedure: ESOPHAGOGASTRODUODENOSCOPY (EGD) WITH PROPOFOL;  Surgeon: Dolores Frame, MD;  [facility];  Service: Gastroenterology;  Laterality: N/A;   ESOPHAGOGASTRODUODENOSCOPY (EGD) WITH PROPOFOL N/A 01/02/2023   Procedure: ESOPHAGOGASTRODUODENOSCOPY (EGD) WITH PROPOFOL;  Surgeon: Dolores Frame, MD;  [facility];  Service: Gastroenterology;  Laterality: N/A;  1:30 pm, asa 3   HEMOSTASIS CLIP PLACEMENT  10/05/2022   Procedure: HEMOSTASIS CLIP PLACEMENT;  Surgeon: Dolores Frame, MD;  [facility];  Service: Gastroenterology;;   POLYPECTOMY  03/23/2016   Procedure: POLYPECTOMY;  Surgeon: Malissa Hippo, MD;  [facility];  Service:  Endoscopy;;  Cecal polyp removed via cold forceps recto-sigmoid polyp removed via cold snare   POLYPECTOMY  10/05/2022   Procedure: POLYPECTOMY;  Surgeon: Dolores Frame, MD;  [facility];  Service: Gastroenterology;;   TRANSURETHRAL RESECTION OF PROSTATE N/A 04/29/2020   Procedure: TRANSURETHRAL RESECTION OF THE PROSTATE (TURP);  Surgeon: Marcine Matar, MD;  Location: [facility];  Service: Urology;  Laterality: N/A;  90 MINS  Current Outpatient Medications  Medication Sig Dispense Refill   acetaminophen (TYLENOL) 500 MG tablet Take 500 mg by mouth every 6 (six) hours as needed for mild pain or moderate pain.     CALCIUM PO Take 300 mg by mouth in the morning and at bedtime.     carvedilol (COREG) 12.5 MG tablet Take 1 tablet (12.5 mg total) by mouth 2 (two) times daily. 60 tablet 0   Cholecalciferol (VITAMIN D3) 50 MCG (2000 UT) TABS Take 2,000 Units by mouth daily.     diltiazem (CARDIZEM CD) 180 MG 24 hr capsule Take 180 mg by mouth 2 (two) times daily.     ferrous sulfate (FEROSUL) 325 (65 FE) MG tablet Take 325 mg by mouth daily with breakfast.     furosemide (LASIX) 40 MG tablet Take 1 tablet (40 mg total) by mouth daily as needed (leg swelling). 03/20/2023 NEW 30 tablet 3   levothyroxine (SYNTHROID, LEVOTHROID) 75 MCG tablet Take 75 mcg by mouth daily before breakfast.     magnesium oxide (MAG-OX) 400 (240 Mg) MG tablet Take 400 mg by mouth 2 (two) times daily.     Melatonin 10 MG TABS Take 10 mg by mouth at bedtime.     nitroGLYCERIN (NITROSTAT) 0.4 MG SL tablet Place 0.4 mg under the tongue every 5 (five) minutes as needed. For chest pains. May repeat for up to 3 doses.     pantoprazole (PROTONIX) 40 MG tablet Take 40 mg by mouth 2 (two) times daily.     Probiotic Product (ALIGN) 4 MG CAPS Take 4 mg by mouth in the morning.     traZODone (DESYREL) 50 MG tablet Take 50 mg by mouth at bedtime.     ustekinumab (STELARA) 90 MG/ML SOSY  injection Inject 1 mL (90 mg total) into the skin every 8 (eight) weeks. 1 mL 6   warfarin (COUMADIN) 2.5 MG tablet TAKE 1 TABLET DAILY EXCEPT 1/2 TABLET ON TUESDAYS OR AS DIRECTED 30 tablet 5   No current facility-administered medications for this visit.    Allergies as of 03/27/2023   (No Known Allergies)    Family History  Problem Relation Age of Onset   Colon cancer Mother    Heart disease Father    Stroke Brother    Healthy Daughter     Social History   Socioeconomic History   Marital status: Married    Spouse name: Not on file   Number of children: Not on file   Years of education: Not on file   Highest education level: Not on file  Occupational History   Occupation: Public house manager: RETIRED  Tobacco Use   Smoking status: Never    Passive exposure: Never   Smokeless tobacco: Never  Vaping Use   Vaping Use: Never used  Substance and Sexual Activity   Alcohol use: No    Alcohol/week: 0.0 standard drinks of alcohol   Drug use: No   Sexual activity: Not on file  Other Topics Concern   Not on file  Social History Narrative   Married   Social Determinants of Health   Financial Resource Strain: Not on file  Food Insecurity: No Food Insecurity (10/02/2022)   Hunger Vital Sign    Worried About Running Out of Food in the Last Year: Never true    Ran Out of Food in the Last Year: Never true  Transportation Needs: No Transportation Needs (10/02/2022)   PRAPARE - Transportation  Lack of Transportation (Medical): No    Lack of Transportation (Non-Medical): No  Physical Activity: Not on file  Stress: Not on file  Social Connections: Not on file   Review of systems General: negative for malaise, night sweats, fever, chills, weight loss Neck: Negative for lumps, goiter, pain and significant neck swelling Resp: Negative for cough, wheezing, dyspnea at rest CV: Negative for chest pain, leg swelling, palpitations, orthopnea GI: denies  melena, hematochezia, nausea, vomiting, diarrhea, constipation, dysphagia, odyonophagia, early satiety or unintentional weight loss.  MSK: Negative for joint pain or swelling, back pain, and muscle pain. Derm: Negative for itching or rash Psych: Denies depression, anxiety, memory loss, confusion. No homicidal or suicidal ideation.  Heme: Negative for prolonged bleeding, bruising easily, and swollen nodes. Endocrine: Negative for cold or heat intolerance, polyuria, polydipsia and goiter. Neuro: negative for tremor, gait imbalance, syncope and seizures. The remainder of the review of systems is noncontributory.  Physical Exam: BP (!) 149/70   Pulse 68   Temp 97.8 F (36.6 C) (Oral)   Ht 5\' 8"  (1.727 m)   Wt 155 lb (70.3 kg)   BMI 23.57 kg/m  General:   Alert and oriented. No distress noted. Pleasant and cooperative.  Head:  Normocephalic and atraumatic. Eyes:  Conjuctiva clear without scleral icterus. Mouth:  Oral mucosa pink and moist. Good dentition. No lesions. Heart: Normal rate and rhythm, s1 and s2 heart sounds present.  Lungs: Clear lung sounds in all lobes. Respirations equal and unlabored. Abdomen:  +BS, soft, non-tender and non-distended. No rebound or guarding. No HSM or masses noted. Derm: No palmar erythema or jaundice Msk:  Symmetrical without gross deformities. Normal posture. Extremities:  Without edema. Neurologic:  Alert and  oriented x4 Psych:  Alert and cooperative. Normal mood and affect.  Invalid input(s): "6 MONTHS"   ASSESSMENT: [name] is a 89 y.o. male presenting today for follow up of Crohn's disease, on Stelara  Started Stelara in January 2024. He is feeling well. No abdominal pain, rectal bleeding, mucus in stools. Having one BM per day to every other day, actually with some constipation he is using dulcolax suppository PRN with good results. Notably fecal calprotectin was 239 in April, he was having looser stools at that time. Had planned for  potential colonoscopy at 6 month mark after initiation of stelara, however, given recent elevated calprotectin, will plan to repeat this in July and tentatively plan for colonoscopy around 9 month mark after initiation of therapy (October 2024). Pending Calprotectin results next month, may also consider checking Stelara levels prior to 06/11/23 dose of Stelara. I discussed the plan with the patient and wife who verbalized understanding and are in agreement with the plan as outlined above. Will update CBC today.    PLAN:  CBC w/diff 2. Continue with stelara Q8w injections  3.  Check calprotecin in July (in reminder folder)  4. Plan for tentative colonoscopy around October 2024 5. Consider checking stelara levels prior to 5th dose (06/11/23) pending calprotectin levels   All questions were answered, patient verbalized understanding and is in agreement with plan as outlined above.   Follow Up: 3 months   Lezette Kitts L. Jeanmarie Hubert, MSN, APRN, AGNP-C Adult-Gerontology Nurse Practitioner [facility]

## 2023-03-27 NOTE — Patient Instructions (Signed)
Continue with Stelara  We will check stool inflammatory marker in July, we will call to remind you of this I want to check basic labs today We will plan for tentative colonoscopy in October  Follow up 3 months  It was a pleasure to see you today. I want to create trusting relationships with patients and provide genuine, compassionate, and quality care. I truly value your feedback! please be on the lookout for a survey regarding your visit with me today. I appreciate your input about our visit and your time in completing this!    [name] L. Jeanmarie Hubert, MSN, APRN, AGNP-C Adult-Gerontology Nurse Practitioner [facility] at [facility]

## 2023-03-28 ENCOUNTER — Other Ambulatory Visit: Payer: Self-pay | Admitting: *Deleted

## 2023-03-28 DIAGNOSIS — M545 Low back pain, unspecified: Secondary | ICD-10-CM | POA: Diagnosis not present

## 2023-03-28 DIAGNOSIS — R2681 Unsteadiness on feet: Secondary | ICD-10-CM | POA: Diagnosis not present

## 2023-03-28 DIAGNOSIS — D691 Qualitative platelet defects: Secondary | ICD-10-CM

## 2023-03-28 DIAGNOSIS — M25551 Pain in right hip: Secondary | ICD-10-CM | POA: Diagnosis not present

## 2023-03-28 DIAGNOSIS — M6281 Muscle weakness (generalized): Secondary | ICD-10-CM | POA: Diagnosis not present

## 2023-03-28 LAB — CBC WITH DIFFERENTIAL/PLATELET
Absolute Monocytes: 580 cells/uL (ref 200–950)
Basophils Absolute: 70 cells/uL (ref 0–200)
Basophils Relative: 1.2 %
Eosinophils Absolute: 157 cells/uL (ref 15–500)
Eosinophils Relative: 2.7 %
HCT: 36.9 % — ABNORMAL LOW (ref 38.5–50.0)
Hemoglobin: 12.2 g/dL — ABNORMAL LOW (ref 13.2–17.1)
Lymphs Abs: 1955 cells/uL (ref 850–3900)
MCH: 29.8 pg (ref 27.0–33.0)
MCHC: 33.1 g/dL (ref 32.0–36.0)
MCV: 90.2 fL (ref 80.0–100.0)
MPV: 10.9 fL (ref 7.5–12.5)
Monocytes Relative: 10 %
Neutro Abs: 3039 cells/uL (ref 1500–7800)
Neutrophils Relative %: 52.4 %
Platelets: 43 10*3/uL — ABNORMAL LOW (ref 140–400)
RBC: 4.09 10*6/uL — ABNORMAL LOW (ref 4.20–5.80)
RDW: 13.8 % (ref 11.0–15.0)
Total Lymphocyte: 33.7 %
WBC: 5.8 10*3/uL (ref 3.8–10.8)

## 2023-04-03 DIAGNOSIS — D691 Qualitative platelet defects: Secondary | ICD-10-CM | POA: Diagnosis not present

## 2023-04-03 DIAGNOSIS — M7989 Other specified soft tissue disorders: Secondary | ICD-10-CM | POA: Diagnosis not present

## 2023-04-03 DIAGNOSIS — Z79899 Other long term (current) drug therapy: Secondary | ICD-10-CM | POA: Diagnosis not present

## 2023-04-03 LAB — CBC
HCT: 38.6 % (ref 38.5–50.0)
Hemoglobin: 12.6 g/dL — ABNORMAL LOW (ref 13.2–17.1)
MCH: 29.2 pg (ref 27.0–33.0)
MCHC: 32.6 g/dL (ref 32.0–36.0)
MCV: 89.4 fL (ref 80.0–100.0)
MPV: 11.4 fL (ref 7.5–12.5)
Platelets: 41 10*3/uL — ABNORMAL LOW (ref 140–400)
RBC: 4.32 10*6/uL (ref 4.20–5.80)
RDW: 14 % (ref 11.0–15.0)
WBC: 6.5 10*3/uL (ref 3.8–10.8)

## 2023-04-03 LAB — BASIC METABOLIC PANEL
BUN/Creatinine Ratio: 13 (calc) (ref 6–22)
BUN: 22 mg/dL (ref 7–25)
CO2: 28 mmol/L (ref 20–32)
Calcium: 9.6 mg/dL (ref 8.6–10.3)
Chloride: 103 mmol/L (ref 98–110)
Creat: 1.76 mg/dL — ABNORMAL HIGH (ref 0.70–1.22)
Glucose, Bld: 96 mg/dL (ref 65–99)
Potassium: 4.3 mmol/L (ref 3.5–5.3)
Sodium: 139 mmol/L (ref 135–146)

## 2023-04-05 ENCOUNTER — Ambulatory Visit (INDEPENDENT_AMBULATORY_CARE_PROVIDER_SITE_OTHER): Payer: PPO | Admitting: Gastroenterology

## 2023-04-05 DIAGNOSIS — M25551 Pain in right hip: Secondary | ICD-10-CM | POA: Diagnosis not present

## 2023-04-06 ENCOUNTER — Encounter (INDEPENDENT_AMBULATORY_CARE_PROVIDER_SITE_OTHER): Payer: Self-pay

## 2023-04-11 DIAGNOSIS — R2681 Unsteadiness on feet: Secondary | ICD-10-CM | POA: Diagnosis not present

## 2023-04-11 DIAGNOSIS — M6281 Muscle weakness (generalized): Secondary | ICD-10-CM | POA: Diagnosis not present

## 2023-04-11 DIAGNOSIS — M25551 Pain in right hip: Secondary | ICD-10-CM | POA: Diagnosis not present

## 2023-04-11 DIAGNOSIS — M545 Low back pain, unspecified: Secondary | ICD-10-CM | POA: Diagnosis not present

## 2023-04-13 ENCOUNTER — Telehealth (INDEPENDENT_AMBULATORY_CARE_PROVIDER_SITE_OTHER): Payer: Self-pay | Admitting: Gastroenterology

## 2023-04-13 NOTE — Telephone Encounter (Signed)
I had a lengthy discussion today with the patient and his wife.  We have been checking his CBC recently and he was found to have severe drop in his platelet count to the 40s range.  Even though it is rare, Stelara could cause severe thrombocytopenia due to hypersplenism.  Given the timeline of his worsening thrombocytopenia, I consider this is related to Stelara as after he started this medication he presented thrombocytopenia.  Due to this, I advised the patient to skip Mondays dose of Stelara.  Will need to repeat a CBC  -patient has an appointment at [facility] in July 15 to have blood drawn, we will send an order for a repeat CBC at [facility].  If platelet count is improving, I would advise to start Entyvio infusions at [place], which the patient is agreeable to proceed with.  If his platelet count persist, we will need to refer him to hematology.  Hi Crystal, can you please send an order for CBC via [facility]?  Mitzie, can you please schedule a follow up appointment for this patient in early to mid August with me or any of the APPs?  Thanks,  Katrinka Blazing, MD Gastroenterology and Hepatology [facility]

## 2023-04-16 ENCOUNTER — Other Ambulatory Visit (INDEPENDENT_AMBULATORY_CARE_PROVIDER_SITE_OTHER): Payer: Self-pay

## 2023-04-16 ENCOUNTER — Ambulatory Visit: Payer: PPO | Attending: Cardiology | Admitting: *Deleted

## 2023-04-16 DIAGNOSIS — I4891 Unspecified atrial fibrillation: Secondary | ICD-10-CM

## 2023-04-16 DIAGNOSIS — D696 Thrombocytopenia, unspecified: Secondary | ICD-10-CM

## 2023-04-16 DIAGNOSIS — Z5181 Encounter for therapeutic drug level monitoring: Secondary | ICD-10-CM

## 2023-04-16 LAB — POCT INR: INR: 2.1 (ref 2.0–3.0)

## 2023-04-16 NOTE — Patient Instructions (Signed)
Continue warfarin 1 tablet daily except 1/2 tablet on Tuesdays °Recheck in 4 weeks °

## 2023-04-16 NOTE — Telephone Encounter (Signed)
Orders placed for [facility] 

## 2023-04-16 NOTE — Telephone Encounter (Signed)
I spoke with the patient and he is aware he will need the blood work at [facility] around 04/30/2023.

## 2023-04-18 DIAGNOSIS — M6281 Muscle weakness (generalized): Secondary | ICD-10-CM | POA: Diagnosis not present

## 2023-04-18 DIAGNOSIS — R2681 Unsteadiness on feet: Secondary | ICD-10-CM | POA: Diagnosis not present

## 2023-04-18 DIAGNOSIS — M25551 Pain in right hip: Secondary | ICD-10-CM | POA: Diagnosis not present

## 2023-04-18 DIAGNOSIS — M545 Low back pain, unspecified: Secondary | ICD-10-CM | POA: Diagnosis not present

## 2023-04-20 DIAGNOSIS — M533 Sacrococcygeal disorders, not elsewhere classified: Secondary | ICD-10-CM | POA: Diagnosis not present

## 2023-04-20 DIAGNOSIS — M545 Low back pain, unspecified: Secondary | ICD-10-CM | POA: Diagnosis not present

## 2023-04-24 ENCOUNTER — Telehealth (INDEPENDENT_AMBULATORY_CARE_PROVIDER_SITE_OTHER): Payer: Self-pay

## 2023-04-24 ENCOUNTER — Other Ambulatory Visit (INDEPENDENT_AMBULATORY_CARE_PROVIDER_SITE_OTHER): Payer: Self-pay

## 2023-04-24 ENCOUNTER — Other Ambulatory Visit (INDEPENDENT_AMBULATORY_CARE_PROVIDER_SITE_OTHER): Payer: Self-pay | Admitting: *Deleted

## 2023-04-24 DIAGNOSIS — K50018 Crohn's disease of small intestine with other complication: Secondary | ICD-10-CM

## 2023-04-24 DIAGNOSIS — D696 Thrombocytopenia, unspecified: Secondary | ICD-10-CM

## 2023-04-25 DIAGNOSIS — M533 Sacrococcygeal disorders, not elsewhere classified: Secondary | ICD-10-CM | POA: Diagnosis not present

## 2023-04-30 DIAGNOSIS — D696 Thrombocytopenia, unspecified: Secondary | ICD-10-CM | POA: Diagnosis not present

## 2023-05-01 ENCOUNTER — Other Ambulatory Visit (INDEPENDENT_AMBULATORY_CARE_PROVIDER_SITE_OTHER): Payer: Self-pay

## 2023-05-01 DIAGNOSIS — D691 Qualitative platelet defects: Secondary | ICD-10-CM

## 2023-05-01 DIAGNOSIS — K50018 Crohn's disease of small intestine with other complication: Secondary | ICD-10-CM | POA: Diagnosis not present

## 2023-05-01 LAB — CBC WITH DIFFERENTIAL/PLATELET
Basophils Absolute: 0 10*3/uL (ref 0.0–0.2)
Basos: 0 %
EOS (ABSOLUTE): 0.2 10*3/uL (ref 0.0–0.4)
Eos: 2 %
Hematocrit: 41 % (ref 37.5–51.0)
Hemoglobin: 13.2 g/dL (ref 13.0–17.7)
Immature Grans (Abs): 0 10*3/uL (ref 0.0–0.1)
Immature Granulocytes: 0 %
Lymphocytes Absolute: 2.6 10*3/uL (ref 0.7–3.1)
Lymphs: 29 %
MCH: 29.5 pg (ref 26.6–33.0)
MCHC: 32.2 g/dL (ref 31.5–35.7)
MCV: 92 fL (ref 79–97)
Monocytes Absolute: 0.8 10*3/uL (ref 0.1–0.9)
Monocytes: 10 %
Neutrophils Absolute: 5.1 10*3/uL (ref 1.4–7.0)
Neutrophils: 59 %
RBC: 4.47 x10E6/uL (ref 4.14–5.80)
RDW: 13.8 % (ref 11.6–15.4)
WBC: 8.7 10*3/uL (ref 3.4–10.8)

## 2023-05-02 DIAGNOSIS — M6281 Muscle weakness (generalized): Secondary | ICD-10-CM | POA: Diagnosis not present

## 2023-05-02 DIAGNOSIS — M25551 Pain in right hip: Secondary | ICD-10-CM | POA: Diagnosis not present

## 2023-05-02 DIAGNOSIS — M545 Low back pain, unspecified: Secondary | ICD-10-CM | POA: Diagnosis not present

## 2023-05-02 DIAGNOSIS — R2681 Unsteadiness on feet: Secondary | ICD-10-CM | POA: Diagnosis not present

## 2023-05-03 DIAGNOSIS — D691 Qualitative platelet defects: Secondary | ICD-10-CM | POA: Diagnosis not present

## 2023-05-04 LAB — PATHOLOGIST SMEAR REVIEW
Basophils Absolute: 0 10*3/uL (ref 0.0–0.2)
Basos: 1 %
EOS (ABSOLUTE): 0.1 10*3/uL (ref 0.0–0.4)
Hematocrit: 38 % (ref 37.5–51.0)
Immature Granulocytes: 0 %
Lymphocytes Absolute: 2.2 10*3/uL (ref 0.7–3.1)
MCH: 29.9 pg (ref 26.6–33.0)
MCHC: 33.2 g/dL (ref 31.5–35.7)
RBC: 4.22 x10E6/uL (ref 4.14–5.80)
RDW: 14.2 % (ref 11.6–15.4)

## 2023-05-04 LAB — CBC WITH DIFFERENTIAL/PLATELET
Basophils Absolute: 0.1 10*3/uL (ref 0.0–0.2)
Basos: 1 %
EOS (ABSOLUTE): 0.2 10*3/uL (ref 0.0–0.4)
Eos: 3 %
Hematocrit: 38.7 % (ref 37.5–51.0)
Hemoglobin: 12.9 g/dL — ABNORMAL LOW (ref 13.0–17.7)
Immature Grans (Abs): 0 10*3/uL (ref 0.0–0.1)
Immature Granulocytes: 1 %
Lymphocytes Absolute: 2.1 10*3/uL (ref 0.7–3.1)
Lymphs: 34 %
MCH: 30.4 pg (ref 26.6–33.0)
MCHC: 33.3 g/dL (ref 31.5–35.7)
MCV: 91 fL (ref 79–97)
Monocytes Absolute: 0.6 10*3/uL (ref 0.1–0.9)
Monocytes: 9 %
Neutrophils Absolute: 3.2 10*3/uL (ref 1.4–7.0)
Neutrophils: 52 %
RBC: 4.25 x10E6/uL (ref 4.14–5.80)
RDW: 14.2 % (ref 11.6–15.4)
WBC: 6.1 10*3/uL (ref 3.4–10.8)

## 2023-05-07 LAB — PATHOLOGIST SMEAR REVIEW
Eos: 2 %
Hemoglobin: 12.6 g/dL — ABNORMAL LOW (ref 13.0–17.7)
Immature Grans (Abs): 0 10*3/uL (ref 0.0–0.1)
Lymphs: 34 %
MCV: 90 fL (ref 79–97)
Monocytes Absolute: 0.6 10*3/uL (ref 0.1–0.9)
Monocytes: 9 %
Neutrophils Absolute: 3.5 10*3/uL (ref 1.4–7.0)
Neutrophils: 54 %
WBC: 6.6 10*3/uL (ref 3.4–10.8)

## 2023-05-08 ENCOUNTER — Telehealth (INDEPENDENT_AMBULATORY_CARE_PROVIDER_SITE_OTHER): Payer: Self-pay

## 2023-05-08 LAB — CALPROTECTIN, FECAL: Calprotectin, Fecal: 186 ug/g — ABNORMAL HIGH (ref 0–120)

## 2023-05-08 NOTE — Telephone Encounter (Signed)
I spoke to the patient today, it seems that the platelet samples were clumping.  Crysta, can you please send him an order for a repeat platelet count (no need to order a CBC) ASAP. Hi [name], can you please refer the patient to hematology ASAP.  Diagnosis thrombocytopenia I discussed this with the patient who understood and agreed.  Depending on the progress of his thrombocytopenia, we will start him on Entyvio.  Thanks,   Katrinka Blazing, MD Gastroenterology and Hepatology [facility]astroenter[facility]

## 2023-05-08 NOTE — Telephone Encounter (Signed)
Patient called today concerned that the platelet counts were cancelled again. He wants to know what this means. I advised I could not answer that and would have you reach out to the regarding this.   They also wanted me to check with you regarding stopping the Stelara. Was he supposed to stop it?   Was he supposed to start [facility]?   Please advise. I told the patient either you or myself would call him back regarding these issues.   Thanks,

## 2023-05-09 ENCOUNTER — Other Ambulatory Visit (INDEPENDENT_AMBULATORY_CARE_PROVIDER_SITE_OTHER): Payer: Self-pay

## 2023-05-09 DIAGNOSIS — D696 Thrombocytopenia, unspecified: Secondary | ICD-10-CM

## 2023-05-09 NOTE — Telephone Encounter (Signed)
Referral placed in epic as urgent, they will contact patient with apt

## 2023-05-09 NOTE — Telephone Encounter (Signed)
I have placed the order for a platelet count at [facility], and spoke with the patient he is aware to have the lab drawn this am. He will come by the office to pick up the orders this am.

## 2023-05-10 LAB — PLATELET COUNT: Platelets: 67 10*3/uL — CL (ref 150–450)

## 2023-05-14 ENCOUNTER — Ambulatory Visit: Payer: PPO | Attending: Cardiology | Admitting: *Deleted

## 2023-05-14 DIAGNOSIS — I4891 Unspecified atrial fibrillation: Secondary | ICD-10-CM

## 2023-05-14 DIAGNOSIS — Z5181 Encounter for therapeutic drug level monitoring: Secondary | ICD-10-CM | POA: Diagnosis not present

## 2023-05-14 LAB — POCT INR: INR: 2.3 (ref 2.0–3.0)

## 2023-05-14 NOTE — Patient Instructions (Signed)
Continue warfarin 1 tablet daily except 1/2 tablet on Tuesdays Recheck in 6 weeks 

## 2023-05-16 DIAGNOSIS — R2681 Unsteadiness on feet: Secondary | ICD-10-CM | POA: Diagnosis not present

## 2023-05-16 DIAGNOSIS — M6281 Muscle weakness (generalized): Secondary | ICD-10-CM | POA: Diagnosis not present

## 2023-05-16 DIAGNOSIS — M25551 Pain in right hip: Secondary | ICD-10-CM | POA: Diagnosis not present

## 2023-05-16 DIAGNOSIS — M545 Low back pain, unspecified: Secondary | ICD-10-CM | POA: Diagnosis not present

## 2023-05-19 DIAGNOSIS — R0981 Nasal congestion: Secondary | ICD-10-CM | POA: Diagnosis not present

## 2023-05-19 DIAGNOSIS — R519 Headache, unspecified: Secondary | ICD-10-CM | POA: Diagnosis not present

## 2023-05-19 DIAGNOSIS — Z6823 Body mass index (BMI) 23.0-23.9, adult: Secondary | ICD-10-CM | POA: Diagnosis not present

## 2023-05-21 NOTE — Progress Notes (Signed)
[facility] [address], [place] [address]   Clinic Day:  05/22/2023  Referring physician: Estanislado Pandy, MD  Patient Care Team: Estanislado Pandy, MD as PCP - General (Cardiology) Jonelle Sidle, MD as PCP - Cardiology (Cardiology)   ASSESSMENT & PLAN:   Assessment: ***  Plan: ***  No orders of the defined types were placed in this encounter.     Alben Deeds Teague,acting as a Neurosurgeon for Doreatha Massed, MD.,have documented all relevant documentation on the behalf of Doreatha Massed, MD,as directed by  Doreatha Massed, MD while in the presence of Doreatha Massed, MD.   ***  [place] R Teague   8/5/202412:57 PM  CHIEF COMPLAINT/PURPOSE OF CONSULT:   Diagnosis: Thrombocytopenia  Current Therapy:  ***  HISTORY OF PRESENT ILLNESS:   [name] is a 89 y.o. male presenting to clinic today for evaluation of thrombocytopenia at the request of Dr. Levon Hedger.  Today, he states that he is doing well overall. His appetite level is at ***%. His energy level is at ***%.  He was found to have severely low platelets at 67 on 05/09/23 from a platelet count lab. POCT INR panel on 05/13/23 was normal. He was found to have abnormal CBC from 05/03/23 that found decreased hemoglobin at 12.6. Fecal calprotectin lab on 05/03/23 was elevated at 186.   ***He denies recent chest pain on exertion, shortness of breath on minimal exertion, pre-syncopal episodes, or palpitations. ***He had not noticed any recent bleeding such as epistaxis, hematuria or hematochezia ***The patient denies over the counter NSAID ingestion. He is not *** on antiplatelets agents. His last colonoscopy was *** ***He had no prior history or diagnosis of cancer. He denies any family history of cancer.  *** His age appropriate screening programs are up-to-date. ***He denies any pica and eats a variety of diet. ***He never donated blood or received blood transfusion. ***The patient was  prescribed oral iron supplements and he takes ***  PAST MEDICAL HISTORY:   Past Medical History: Past Medical History:  Diagnosis Date   Anemia    Atrial fibrillation (HCC)    BPH (benign prostatic hyperplasia)    CAD (coronary artery disease)    Catheterization 2004, mild/moderate nonobstructive disease  /   nuclear, 2007, small inferior scar // no ischemia   Crohn's disease (HCC)    Dysrhythmia    Elevated PSA    GERD (gastroesophageal reflux disease)    History of kidney stones    History of pneumonia    Hypothyroidism    Mitral regurgitation    Pneumonia    SBO (small bowel obstruction) (HCC)    Urinary retention    Wears glasses     Surgical History: Past Surgical History:  Procedure Laterality Date   AGILE CAPSULE  10/18/2011   Procedure: AGILE CAPSULE;  Surgeon: Malissa Hippo, MD;  [facility];  Service: Endoscopy;  Laterality: N/A;  730   BIOPSY  10/05/2022   Procedure: BIOPSY;  Surgeon: Dolores Frame, MD;  [facility];  Service: Gastroenterology;;   BIOPSY  01/02/2023   Procedure: BIOPSY;  Surgeon: Dolores Frame, MD;  [facility];  Service: Gastroenterology;;   CARDIAC CATHETERIZATION  2004   CATARACT EXTRACTION W/PHACO Right 06/27/2021   Procedure: CATARACT EXTRACTION PHACO AND INTRAOCULAR LENS PLACEMENT (IOC);  Surgeon: Fabio Pierce, MD;  Location: [facility];  Service: Ophthalmology;  Laterality: Right;  CDE 21.98   CATARACT EXTRACTION W/PHACO Left  07/11/2021   Procedure: CATARACT EXTRACTION PHACO AND INTRAOCULAR LENS PLACEMENT LEFT EYE;  Surgeon: Fabio Pierce, MD;  Location: [facility];  Service: Ophthalmology;  Laterality: Left;  CDE=10.42   CHOLECYSTECTOMY  2010   Dr. Gabriel Cirri   COLONOSCOPY  2008   [facility]   COLONOSCOPY N/A 03/23/2016   Procedure: COLONOSCOPY;  Surgeon: Malissa Hippo, MD;  [facility];  Service: Endoscopy;  Laterality: N/A;  1:00   CYSTOSCOPY WITH INSERTION OF UROLIFT      ESOPHAGEAL BRUSHING  10/05/2022   Procedure: ESOPHAGEAL BRUSHING;  Surgeon: Dolores Frame, MD;  [facility];  Service: Gastroenterology;;   ESOPHAGOGASTRODUODENOSCOPY  11/08/2011   Procedure: ESOPHAGOGASTRODUODENOSCOPY (EGD);  Surgeon: Malissa Hippo, MD;  [facility];  Service: Endoscopy;  Laterality: N/A;  300   ESOPHAGOGASTRODUODENOSCOPY (EGD) WITH PROPOFOL N/A 10/05/2022   Procedure: ESOPHAGOGASTRODUODENOSCOPY (EGD) WITH PROPOFOL;  Surgeon: Dolores Frame, MD;  [facility];  Service: Gastroenterology;  Laterality: N/A;   ESOPHAGOGASTRODUODENOSCOPY (EGD) WITH PROPOFOL N/A 01/02/2023   Procedure: ESOPHAGOGASTRODUODENOSCOPY (EGD) WITH PROPOFOL;  Surgeon: Dolores Frame, MD;  [facility];  Service: Gastroenterology;  Laterality: N/A;  1:30 pm, asa 3   HEMOSTASIS CLIP PLACEMENT  10/05/2022   Procedure: HEMOSTASIS CLIP PLACEMENT;  Surgeon: Dolores Frame, MD;  [facility];  Service: Gastroenterology;;   POLYPECTOMY  03/23/2016   Procedure: POLYPECTOMY;  Surgeon: Malissa Hippo, MD;  [facility];  Service: Endoscopy;;  Cecal polyp removed via cold forceps recto-sigmoid polyp removed via cold snare   POLYPECTOMY  10/05/2022   Procedure: POLYPECTOMY;  Surgeon: Dolores Frame, MD;  [facility];  Service: Gastroenterology;;   TRANSURETHRAL RESECTION OF PROSTATE N/A 04/29/2020   Procedure: TRANSURETHRAL RESECTION OF THE PROSTATE (TURP);  Surgeon: Marcine Matar, MD;  Location: [facility];  Service: Urology;  Laterality: N/A;  76 MINS    Social History: Social History   Socioeconomic History   Marital status: Married    Spouse name: Not on file   Number of children: Not on file   Years of education: Not on file   Highest education level: Not on file  Occupational History   Occupation: Public house manager: RETIRED  Tobacco  Use   Smoking status: Never    Passive exposure: Never   Smokeless tobacco: Never  Vaping Use   Vaping status: Never Used  Substance and Sexual Activity   Alcohol use: No    Alcohol/week: 0.0 standard drinks of alcohol   Drug use: No   Sexual activity: Not on file  Other Topics Concern   Not on file  Social History Narrative   Married   Social Determinants of Health   Financial Resource Strain: Not on file  Food Insecurity: No Food Insecurity (10/02/2022)   Hunger Vital Sign    Worried About Running Out of Food in the Last Year: Never true    Ran Out of Food in the Last Year: Never true  Transportation Needs: No Transportation Needs (10/02/2022)   PRAPARE - Administrator, Civil Service (Medical): No    Lack of Transportation (Non-Medical): No  Physical Activity: Not on file  Stress: Not on file  Social Connections: Not on file  Intimate Partner Violence: Not At Risk (10/02/2022)   Humiliation, Afraid, Rape, and Kick questionnaire    Fear of Current or Ex-Partner: No    Emotionally Abused: No  Physically Abused: No    Sexually Abused: No    Family History: Family History  Problem Relation Age of Onset   Colon cancer Mother    Heart disease Father    Stroke Brother    Healthy Daughter     Current Medications:  Current Outpatient Medications:    acetaminophen (TYLENOL) 500 MG tablet, Take 500 mg by mouth every 6 (six) hours as needed for mild pain or moderate pain., Disp: , Rfl:    CALCIUM PO, Take 300 mg by mouth in the morning and at bedtime., Disp: , Rfl:    carvedilol (COREG) 12.5 MG tablet, Take 1 tablet (12.5 mg total) by mouth 2 (two) times daily., Disp: 60 tablet, Rfl: 0   Cholecalciferol (VITAMIN D3) 50 MCG (2000 UT) TABS, Take 2,000 Units by mouth daily., Disp: , Rfl:    diltiazem (CARDIZEM CD) 180 MG 24 hr capsule, Take 180 mg by mouth 2 (two) times daily., Disp: , Rfl:    ferrous sulfate (FEROSUL) 325 (65 FE) MG tablet, Take 325 mg by  mouth daily with breakfast., Disp: , Rfl:    furosemide (LASIX) 40 MG tablet, Take 1 tablet (40 mg total) by mouth daily as needed (leg swelling). 03/20/2023 NEW, Disp: 30 tablet, Rfl: 3   levothyroxine (SYNTHROID, LEVOTHROID) 75 MCG tablet, Take 75 mcg by mouth daily before breakfast., Disp: , Rfl:    magnesium oxide (MAG-OX) 400 (240 Mg) MG tablet, Take 400 mg by mouth 2 (two) times daily., Disp: , Rfl:    Melatonin 10 MG TABS, Take 10 mg by mouth at bedtime., Disp: , Rfl:    nitroGLYCERIN (NITROSTAT) 0.4 MG SL tablet, Place 0.4 mg under the tongue every 5 (five) minutes as needed. For chest pains. May repeat for up to 3 doses., Disp: , Rfl:    pantoprazole (PROTONIX) 40 MG tablet, Take 40 mg by mouth 2 (two) times daily., Disp: , Rfl:    Probiotic Product (ALIGN) 4 MG CAPS, Take 4 mg by mouth in the morning., Disp: , Rfl:    traZODone (DESYREL) 50 MG tablet, Take 50 mg by mouth at bedtime., Disp: , Rfl:    warfarin (COUMADIN) 2.5 MG tablet, TAKE 1 TABLET DAILY EXCEPT 1/2 TABLET ON TUESDAYS OR AS DIRECTED, Disp: 30 tablet, Rfl: 5   Allergies: No Known Allergies  REVIEW OF SYSTEMS:   Review of Systems  Constitutional:  Negative for chills, fatigue and fever.  HENT:   Negative for lump/mass, mouth sores, nosebleeds, sore throat and trouble swallowing.   Eyes:  Negative for eye problems.  Respiratory:  Negative for cough and shortness of breath.   Cardiovascular:  Negative for chest pain, leg swelling and palpitations.  Gastrointestinal:  Negative for abdominal pain, constipation, diarrhea, nausea and vomiting.  Genitourinary:  Negative for bladder incontinence, difficulty urinating, dysuria, frequency, hematuria and nocturia.   Musculoskeletal:  Negative for arthralgias, back pain, flank pain, myalgias and neck pain.  Skin:  Negative for itching and rash.  Neurological:  Negative for dizziness, headaches and numbness.  Hematological:  Does not bruise/bleed easily.  Psychiatric/Behavioral:   Negative for depression, sleep disturbance and suicidal ideas. The patient is not nervous/anxious.   All other systems reviewed and are negative.    VITALS:   There were no vitals taken for this visit.  Wt Readings from Last 3 Encounters:  03/27/23 155 lb (70.3 kg)  01/25/23 155 lb 3.2 oz (70.4 kg)  01/19/23 152 lb 12.8 oz (69.3 kg)  There is no height or weight on file to calculate BMI.   PHYSICAL EXAM:   Physical Exam Vitals and nursing note reviewed. Exam conducted with a chaperone present.  Constitutional:      Appearance: Normal appearance.  Cardiovascular:     Rate and Rhythm: Normal rate and regular rhythm.     Pulses: Normal pulses.     Heart sounds: Normal heart sounds.  Pulmonary:     Effort: Pulmonary effort is normal.     Breath sounds: Normal breath sounds.  Abdominal:     Palpations: Abdomen is soft. There is no hepatomegaly, splenomegaly or mass.     Tenderness: There is no abdominal tenderness.  Musculoskeletal:     Right lower leg: No edema.     Left lower leg: No edema.  Lymphadenopathy:     Cervical: No cervical adenopathy.     Right cervical: No superficial, deep or posterior cervical adenopathy.    Left cervical: No superficial, deep or posterior cervical adenopathy.     Upper Body:     Right upper body: No supraclavicular or axillary adenopathy.     Left upper body: No supraclavicular or axillary adenopathy.  Neurological:     General: No focal deficit present.     Mental Status: He is alert and oriented to person, place, and time.  Psychiatric:        Mood and Affect: Mood normal.        Behavior: Behavior normal.     LABS:      Latest Ref Rng & Units 05/09/2023    8:58 AM 05/03/2023    8:44 AM 05/03/2023    8:40 AM  CBC  WBC 3.4 - 10.8 x10E3/uL  6.1  6.6   Hemoglobin 13.0 - 17.7 g/dL  16.1  09.6   Hematocrit 37.5 - 51.0 %  38.7  38.0   Platelets 150 - 450 x10E3/uL 67  CANCELED  CANCELED       Latest Ref Rng & Units 04/03/2023     8:28 AM 01/14/2023    9:58 AM 12/29/2022   10:57 AM  CMP  Glucose 65 - 99 mg/dL 96  045  409   BUN 7 - 25 mg/dL 22  19  16    Creatinine 0.70 - 1.22 mg/dL 8.11  9.14  7.82   Sodium 135 - 146 mmol/L 139  136  137   Potassium 3.5 - 5.3 mmol/L 4.3  3.4  3.3   Chloride 98 - 110 mmol/L 103  105  104   CO2 20 - 32 mmol/L 28  23  23    Calcium 8.6 - 10.3 mg/dL 9.6  8.4  8.5      No results found for: "CEA1", "CEA" / No results found for: "CEA1", "CEA" No results found for: "PSA1" No results found for: "NFA213" No results found for: "CAN125"  No results found for: "TOTALPROTELP", "ALBUMINELP", "A1GS", "A2GS", "BETS", "BETA2SER", "GAMS", "MSPIKE", "SPEI" No results found for: "TIBC", "FERRITIN", "IRONPCTSAT" No results found for: "LDH"   STUDIES:   No results found.

## 2023-05-22 ENCOUNTER — Telehealth (INDEPENDENT_AMBULATORY_CARE_PROVIDER_SITE_OTHER): Payer: Self-pay

## 2023-05-22 ENCOUNTER — Inpatient Hospital Stay: Payer: PPO

## 2023-05-22 ENCOUNTER — Inpatient Hospital Stay: Payer: PPO | Attending: Hematology | Admitting: Hematology

## 2023-05-22 VITALS — BP 159/78 | HR 75 | Temp 98.2°F | Resp 18 | Wt 154.4 lb

## 2023-05-22 DIAGNOSIS — D696 Thrombocytopenia, unspecified: Secondary | ICD-10-CM | POA: Insufficient documentation

## 2023-05-22 DIAGNOSIS — E538 Deficiency of other specified B group vitamins: Secondary | ICD-10-CM | POA: Diagnosis not present

## 2023-05-22 DIAGNOSIS — Z7901 Long term (current) use of anticoagulants: Secondary | ICD-10-CM | POA: Diagnosis not present

## 2023-05-22 DIAGNOSIS — K5090 Crohn's disease, unspecified, without complications: Secondary | ICD-10-CM | POA: Diagnosis not present

## 2023-05-22 DIAGNOSIS — Z800 Family history of malignant neoplasm of digestive organs: Secondary | ICD-10-CM | POA: Insufficient documentation

## 2023-05-22 DIAGNOSIS — Z79899 Other long term (current) drug therapy: Secondary | ICD-10-CM | POA: Insufficient documentation

## 2023-05-22 DIAGNOSIS — I4891 Unspecified atrial fibrillation: Secondary | ICD-10-CM | POA: Insufficient documentation

## 2023-05-22 LAB — CBC WITH DIFFERENTIAL/PLATELET
Abs Immature Granulocytes: 0.02 10*3/uL (ref 0.00–0.07)
Basophils Absolute: 0.1 10*3/uL (ref 0.0–0.1)
Basophils Relative: 1 %
Eosinophils Absolute: 0.1 10*3/uL (ref 0.0–0.5)
Eosinophils Relative: 2 %
HCT: 43.1 % (ref 39.0–52.0)
Hemoglobin: 14.5 g/dL (ref 13.0–17.0)
Immature Granulocytes: 0 %
Lymphocytes Relative: 31 %
Lymphs Abs: 1.8 10*3/uL (ref 0.7–4.0)
MCH: 30.9 pg (ref 26.0–34.0)
MCHC: 33.6 g/dL (ref 30.0–36.0)
MCV: 91.9 fL (ref 80.0–100.0)
Monocytes Absolute: 0.5 10*3/uL (ref 0.1–1.0)
Monocytes Relative: 9 %
Neutro Abs: 3.3 10*3/uL (ref 1.7–7.7)
Neutrophils Relative %: 57 %
Platelets: 186 10*3/uL (ref 150–400)
RBC: 4.69 MIL/uL (ref 4.22–5.81)
RDW: 14.2 % (ref 11.5–15.5)
WBC Morphology: REACTIVE
WBC: 5.9 10*3/uL (ref 4.0–10.5)
nRBC: 0 % (ref 0.0–0.2)

## 2023-05-22 LAB — IRON AND TIBC
Iron: 79 ug/dL (ref 45–182)
Saturation Ratios: 23 % (ref 17.9–39.5)
TIBC: 349 ug/dL (ref 250–450)
UIBC: 270 ug/dL

## 2023-05-22 LAB — HEPATITIS C ANTIBODY: HCV Ab: NONREACTIVE

## 2023-05-22 LAB — HEPATITIS B CORE ANTIBODY, TOTAL: Hep B Core Total Ab: NONREACTIVE

## 2023-05-22 LAB — RETICULOCYTES
Immature Retic Fract: 5.5 % (ref 2.3–15.9)
RBC.: 4.65 MIL/uL (ref 4.22–5.81)
Retic Count, Absolute: 51.6 10*3/uL (ref 19.0–186.0)
Retic Ct Pct: 1.1 % (ref 0.4–3.1)

## 2023-05-22 LAB — SAVE SMEAR(SSMR), FOR PROVIDER SLIDE REVIEW

## 2023-05-22 LAB — VITAMIN B12: Vitamin B-12: 269 pg/mL (ref 180–914)

## 2023-05-22 LAB — HEPATITIS B SURFACE ANTIBODY,QUALITATIVE: Hep B S Ab: NONREACTIVE

## 2023-05-22 LAB — FERRITIN: Ferritin: 96 ng/mL (ref 24–336)

## 2023-05-22 LAB — LACTATE DEHYDROGENASE: LDH: 145 U/L (ref 98–192)

## 2023-05-22 LAB — FOLATE: Folate: 21.3 ng/mL (ref >5.9)

## 2023-05-22 LAB — HEPATITIS B SURFACE ANTIGEN: Hepatitis B Surface Ag: NONREACTIVE

## 2023-05-22 NOTE — Telephone Encounter (Signed)
Sorry, I missed in the message "IF the platelet count is improving". He will see heamtology and see [place] in clinic, if stable we will start him on Entyvio but needs to be seen in our office first (appt with [facility])

## 2023-05-22 NOTE — Telephone Encounter (Signed)
Hello, Is there something I need to do for Mr.Chalfant?  The following message was placed in the patient chart, but was not forwarded to a nursing staff member (that I can tell), while I was off. Please advise  Dolores Frame, MD 05/14/2023  2:36 PM EDT Back to Top    I called the patient to inform about the results of recent blood testing which showed improvement in his thrombocytopenia up to 67,000.  We will repeat a platelet count in 1 month and he will follow up with hematology.  The platelet number is improving and there is no other concerning abnormalities from the hematological standpoint, we will start him on Entyvio. Chelse FYI, he will see you in a couple weeks.

## 2023-05-22 NOTE — Patient Instructions (Signed)
You were seen and examined today by Dr. Katragadda. Dr. Katragadda is a hematologist, meaning that he specializes in blood abnormalities. Dr. Katragadda discussed your past medical history, family history of cancers/blood conditions and the events that led to you being here today.  You were referred to Dr. Katragadda due to thrombocytopenia (low platelets).  Dr. Katragadda has recommended additional labs today for further evaluation.  Follow-up as scheduled.  

## 2023-05-22 NOTE — Telephone Encounter (Signed)
[facility]

## 2023-05-28 ENCOUNTER — Encounter (INDEPENDENT_AMBULATORY_CARE_PROVIDER_SITE_OTHER): Payer: Self-pay | Admitting: Gastroenterology

## 2023-05-28 ENCOUNTER — Ambulatory Visit (INDEPENDENT_AMBULATORY_CARE_PROVIDER_SITE_OTHER): Payer: PPO | Admitting: Gastroenterology

## 2023-05-28 VITALS — BP 136/64 | HR 73 | Temp 98.4°F | Ht 68.0 in | Wt 155.0 lb

## 2023-05-28 DIAGNOSIS — K5090 Crohn's disease, unspecified, without complications: Secondary | ICD-10-CM | POA: Diagnosis not present

## 2023-05-28 DIAGNOSIS — R7301 Impaired fasting glucose: Secondary | ICD-10-CM | POA: Diagnosis not present

## 2023-05-28 DIAGNOSIS — E039 Hypothyroidism, unspecified: Secondary | ICD-10-CM | POA: Diagnosis not present

## 2023-05-28 DIAGNOSIS — K2100 Gastro-esophageal reflux disease with esophagitis, without bleeding: Secondary | ICD-10-CM | POA: Diagnosis not present

## 2023-05-28 DIAGNOSIS — K50018 Crohn's disease of small intestine with other complication: Secondary | ICD-10-CM

## 2023-05-28 DIAGNOSIS — K219 Gastro-esophageal reflux disease without esophagitis: Secondary | ICD-10-CM

## 2023-05-28 DIAGNOSIS — R5383 Other fatigue: Secondary | ICD-10-CM | POA: Diagnosis not present

## 2023-05-28 DIAGNOSIS — E7849 Other hyperlipidemia: Secondary | ICD-10-CM | POA: Diagnosis not present

## 2023-05-28 DIAGNOSIS — E782 Mixed hyperlipidemia: Secondary | ICD-10-CM | POA: Diagnosis not present

## 2023-05-28 DIAGNOSIS — E559 Vitamin D deficiency, unspecified: Secondary | ICD-10-CM | POA: Diagnosis not present

## 2023-05-28 DIAGNOSIS — N189 Chronic kidney disease, unspecified: Secondary | ICD-10-CM | POA: Diagnosis not present

## 2023-05-28 MED ORDER — OMEPRAZOLE 40 MG PO CPDR: 40.0000 mg | DELAYED_RELEASE_CAPSULE | Freq: Every day | ORAL | 1 refills | Status: DC

## 2023-05-28 NOTE — Patient Instructions (Signed)
Please stop pantoprazole We will start omeprazole for you GERD/belching, please let me know if this does not improve As discussed, given platelet count has normalized, we will get the process for Entyvio started to treat your Crohn's disease. You will receive 2 IV infusions 2 weeks apart then will start at home injections every 2 weeks at the 6 week mark.  Follow up 8 weeks  It was a pleasure to see you today. I want to create trusting relationships with patients and provide genuine, compassionate, and quality care. I truly value your feedback! please be on the lookout for a survey regarding your visit with me today. I appreciate your input about our visit and your time in completing this!     L. Jeanmarie Hubert, MSN, APRN, AGNP-C Adult-Gerontology Nurse Practitioner [facility] at [facility]

## 2023-05-28 NOTE — Progress Notes (Addendum)
Referring Provider: Estanislado Pandy, MD Primary Care Physician:  Estanislado Pandy, MD Primary GI Physician:   Chief Complaint  Patient presents with   Crohn's Disease    Follow up on Crohn's. Last dose of stelara was May 6th. Waiting to start new treatment. Had low platelets.    Gastroesophageal Reflux    Takes protonix bid. Having a lot of belching.    HPI:   [name] is a 89 y.o. male with past medical history of  small bowel Crohn's disease status post resection complicated by recurrent stricture on Humira, C, Stelara induced thrombocytopenia, C difficile colitis, BPH, coronary artery disease, atrial fibrillation, GERD, hypothyroidism   Patient presenting today for follow up of Crohn's disease/to discuss new therapy and GERD  Lat seen June 2024, at that time doing well on stelara which he started in January. Labs following his visit showed thrombocytopenia which persisted. He was advised to follow with hematology and stop his Stelara as there was concern thrombotypenia was secondary to hyperspeenism from the stelara.   Recommended by Dr. Levon Hedger to start [facility] once platelet count returned to normal.   He saw Hematology on 8/6, labs at that time with plt count of 186k (67k, 41k, 43k previously)    Present:  States he is feeling pretty good in regards to his Crohn's disease. Last dose of stelara was 5/6. Denies abdominal pain but has some RLQ pressure he notes has been present for a few years, since he saw Dr. Karilyn Cota in the past it has been present. He is having usually 1 BM daily that can range from solid to more loose stools. No watery stools. Denies rectal bleeding but stools are darker on iron pills, especially in the morning, notes stools are usually a normal brown color in the afternoon if he goes again.   He notes he is having more belching but denies any nausea, vomiting, heartburn or acid coming up. He notes some occasional sore throat. No dysphagia. He feels thirsty  upon waking but no hoarseness or coughing. Has been maintained on Protonix for a few years.   CT A/P with contrast 12/22/22: No acute intra-abdominal or pelvic pathology. 2. No hydronephrosis or nephrolithiasis. 3. Sigmoid diverticulosis. No bowel obstruction. Normal appendix. 4. Partially visualized small bilateral pleural effusions with associated minimal compressive atelectasis of the lower lobes.   Last EGD: 01/02/23 - Discolored mucosa in the esophagus. Biopsied.                           - 2 cm hiatal hernia.                           - Normal stomach.                           - Submucosal nodule - NET found in the duodenum.                            Slightly larger in size. Biopsied.                           - Buried metallic clip in between layers of second  portion of small bowel. Last Colonoscopy: 03/23/2016 - Two small polyps at the recto-sigmoid colon and in the cecum, removed with a cold snare. Resected and retrieved. - Mild diverticulosis in the sigmoid colon. - External and internal hemorrhoids   Repeat EGD 1 year    Past Medical History:  Diagnosis Date   Anemia    Atrial fibrillation (HCC)    BPH (benign prostatic hyperplasia)    CAD (coronary artery disease)    Catheterization 2004, mild/moderate nonobstructive disease  /   nuclear, 2007, small inferior scar // no ischemia   Crohn's disease (HCC)    Dysrhythmia    Elevated PSA    GERD (gastroesophageal reflux disease)    History of kidney stones    History of pneumonia    Hypothyroidism    Mitral regurgitation    Pneumonia    SBO (small bowel obstruction) (HCC)    Urinary retention    Wears glasses     Past Surgical History:  Procedure Laterality Date   AGILE CAPSULE  10/18/2011   Procedure: AGILE CAPSULE;  Surgeon: Malissa Hippo, MD;  [facility];  Service: Endoscopy;  Laterality: N/A;  730   BIOPSY  10/05/2022   Procedure: BIOPSY;  Surgeon: Dolores Frame, MD;  [facility];  Service: Gastroenterology;;   BIOPSY  01/02/2023   Procedure: BIOPSY;  Surgeon: Dolores Frame, MD;  [facility];  Service: Gastroenterology;;   CARDIAC CATHETERIZATION  2004   CATARACT EXTRACTION W/PHACO Right 06/27/2021   Procedure: CATARACT EXTRACTION PHACO AND INTRAOCULAR LENS PLACEMENT (IOC);  Surgeon: Fabio Pierce, MD;  Location: [facility];  Service: Ophthalmology;  Laterality: Right;  CDE 21.98   CATARACT EXTRACTION W/PHACO Left 07/11/2021   Procedure: CATARACT EXTRACTION PHACO AND INTRAOCULAR LENS PLACEMENT LEFT EYE;  Surgeon: Fabio Pierce, MD;  Location: [facility];  Service: Ophthalmology;  Laterality: Left;  CDE=10.42   CHOLECYSTECTOMY  2010   Dr. Gabriel Cirri   COLONOSCOPY  2008   [facility]   COLONOSCOPY N/A 03/23/2016   Procedure: COLONOSCOPY;  Surgeon: Malissa Hippo, MD;  [facility]: [facility];  Service: Endoscopy;  Laterality: N/A;  1:00   CYSTOSCOPY WITH INSERTION OF UROLIFT     ESOPHAGEAL BRUSHING  10/05/2022   Procedure: ESOPHAGEAL BRUSHING;  Surgeon: Dolores Frame, MD;  [facility]: [facility];  Service: Gastroenterology;;   ESOPHAGOGASTRODUODENOSCOPY  11/08/2011   Procedure: ESOPHAGOGASTRODUODENOSCOPY (EGD);  Surgeon: Malissa Hippo, MD;  [facility];  Service: Endoscopy;  Laterality: N/A;  300   ESOPHAGOGASTRODUODENOSCOPY (EGD) WITH PROPOFOL N/A 10/05/2022   Procedure: ESOPHAGOGASTRODUODENOSCOPY (EGD) WITH PROPOFOL;  Surgeon: Dolores Frame, MD;  [facility];  Service: Gastroenterology;  Laterality: N/A;   ESOPHAGOGASTRODUODENOSCOPY (EGD) WITH PROPOFOL N/A 01/02/2023   Procedure: ESOPHAGOGASTRODUODENOSCOPY (EGD) WITH PROPOFOL;  Surgeon: Dolores Frame, MD;  [facility];  Service: Gastroenterology;  Laterality: N/A;  1:30 pm, asa 3   HEMOSTASIS CLIP PLACEMENT  10/05/2022   Procedure: HEMOSTASIS CLIP PLACEMENT;  Surgeon: Dolores Frame, MD;   [facility];  Service: Gastroenterology;;   POLYPECTOMY  03/23/2016   Procedure: POLYPECTOMY;  Surgeon: Malissa Hippo, MD;  [facility];  Service: Endoscopy;;  Cecal polyp removed via cold forceps recto-sigmoid polyp removed via cold snare   POLYPECTOMY  10/05/2022   Procedure: POLYPECTOMY;  Surgeon: Dolores Frame, MD;  [facility];  Service: Gastroenterology;;   TRANSURETHRAL RESECTION OF PROSTATE N/A 04/29/2020  Procedure: TRANSURETHRAL RESECTION OF THE PROSTATE (TURP);  Surgeon: Marcine Matar, MD;  Location: [facility];  Service: Urology;  Laterality: N/A;  90 MINS    Current Outpatient Medications  Medication Sig Dispense Refill   acetaminophen (TYLENOL) 500 MG tablet Take 500 mg by mouth every 6 (six) hours as needed for mild pain or moderate pain.     CALCIUM PO Take 300 mg by mouth in the morning and at bedtime.     carvedilol (COREG) 12.5 MG tablet Take 1 tablet (12.5 mg total) by mouth 2 (two) times daily. 60 tablet 0   Cholecalciferol (VITAMIN D3) 50 MCG (2000 UT) TABS Take 2,000 Units by mouth daily.     diltiazem (CARDIZEM CD) 180 MG 24 hr capsule Take 180 mg by mouth 2 (two) times daily.     ferrous sulfate (FEROSUL) 325 (65 FE) MG tablet Take 325 mg by mouth daily with breakfast.     fluticasone (FLONASE) 50 MCG/ACT nasal spray INSTILL 2 SPRAYS EVERY DAY     furosemide (LASIX) 40 MG tablet Take 1 tablet (40 mg total) by mouth daily as needed (leg swelling). 03/20/2023 NEW 30 tablet 3   levothyroxine (SYNTHROID, LEVOTHROID) 75 MCG tablet Take 75 mcg by mouth daily before breakfast.     magnesium oxide (MAG-OX) 400 (240 Mg) MG tablet Take 400 mg by mouth 2 (two) times daily.     Melatonin 10 MG TABS Take 10 mg by mouth at bedtime.     nitroGLYCERIN (NITROSTAT) 0.4 MG SL tablet Place 0.4 mg under the tongue every 5 (five) minutes as needed. For chest pains. May repeat for up to 3 doses.     pantoprazole (PROTONIX)  40 MG tablet Take 40 mg by mouth 2 (two) times daily.     Probiotic Product (ALIGN) 4 MG CAPS Take 4 mg by mouth in the morning.     traZODone (DESYREL) 50 MG tablet Take 50 mg by mouth at bedtime.     warfarin (COUMADIN) 2.5 MG tablet TAKE 1 TABLET DAILY EXCEPT 1/2 TABLET ON TUESDAYS OR AS DIRECTED 30 tablet 5   No current facility-administered medications for this visit.    Allergies as of 05/28/2023   (No Known Allergies)    Family History  Problem Relation Age of Onset   Colon cancer Mother    Heart disease Father    Stroke Brother    Healthy Daughter     Social History   Socioeconomic History   Marital status: Married    Spouse name: Not on file   Number of children: Not on file   Years of education: Not on file   Highest education level: Not on file  Occupational History   Occupation: Public house manager: RETIRED  Tobacco Use   Smoking status: Never    Passive exposure: Never   Smokeless tobacco: Never  Vaping Use   Vaping status: Never Used  Substance and Sexual Activity   Alcohol use: No    Alcohol/week: 0.0 standard drinks of alcohol   Drug use: No   Sexual activity: Not on file  Other Topics Concern   Not on file  Social History Narrative   Married   Social Determinants of Health   Financial Resource Strain: Not on file  Food Insecurity: No Food Insecurity (10/02/2022)   Hunger Vital Sign    Worried About Running Out of Food in the Last Year: Never true    Ran Out of Food  in the Last Year: Never true  Transportation Needs: No Transportation Needs (10/02/2022)   PRAPARE - Administrator, Civil Service (Medical): No    Lack of Transportation (Non-Medical): No  Physical Activity: Not on file  Stress: Not on file  Social Connections: Not on file    Review of systems General: negative for malaise, night sweats, fever, chills, weight loss Neck: Negative for lumps, goiter, pain and significant neck swelling Resp:  Negative for cough, wheezing, dyspnea at rest CV: Negative for chest pain, leg swelling, palpitations, orthopnea GI: denies melena, hematochezia, nausea, vomiting, diarrhea, constipation, dysphagia, odyonophagia, early satiety or unintentional weight loss. +belching  MSK: Negative for joint pain or swelling, back pain, and muscle pain. Derm: Negative for itching or rash Psych: Denies depression, anxiety, memory loss, confusion. No homicidal or suicidal ideation.  Heme: Negative for prolonged bleeding, bruising easily, and swollen nodes. Endocrine: Negative for cold or heat intolerance, polyuria, polydipsia and goiter. Neuro: negative for tremor, gait imbalance, syncope and seizures. The remainder of the review of systems is noncontributory.  Physical Exam: BP (!) 153/73   Pulse 73   Temp 98.4 F (36.9 C) (Oral)   Ht 5\' 8"  (1.727 m)   Wt 155 lb (70.3 kg)   BMI 23.57 kg/m  General:   Alert and oriented. No distress noted. Pleasant and cooperative.  Head:  Normocephalic and atraumatic. Eyes:  Conjuctiva clear without scleral icterus. Mouth:  Oral mucosa pink and moist. Good dentition. No lesions. Heart: Normal rate and rhythm, s1 and s2 heart sounds present.  Lungs: Clear lung sounds in all lobes. Respirations equal and unlabored. Abdomen:  +BS, soft, non-tender and non-distended. No rebound or guarding. No HSM or masses noted. Derm: No palmar erythema or jaundice Msk:  Symmetrical without gross deformities. Normal posture. Extremities:  Without edema. Neurologic:  Alert and  oriented x4 Psych:  Alert and cooperative. Normal mood and affect.  Invalid input(s): "6 MONTHS"   ASSESSMENT: [name] is a 89 y.o. male presenting today for follow up of Crohn's/initiation of new therapy and GERD.  Crohn's disease: previously on stelara that he started in January 2024, he was doing well on this, with plans for CTE/MRE around 9 month mark after initiation of therapy, however, labs done  after last follow up visit in June with thrombocytopenia that persisted over further repeat lab testing. It was thought that he may have stelara induced thrombocytopenia/hyperspleenism secondary to the medication, therefore, Stelara was stopped and he was referred to Hematology where he had labs done on 8/6 with normal plt count of 186k. In regards to his Crohn's, he is feeling well despite no current therapy. Having 1-2 solid to loose BMs per day. Denies rectal bleeding or abdominal pain, has some chronic RLQ pressure that he notes has been present for a few years. Calprotectin in July was 186. He was recommended to start Entyvio once platelet count returned to normal, therefore we will get the process started for this.   GERD: on protonix 40mg  BID with more belching recently, some sore throat on occasion but no overt heartburn or acid regurgitation. Given he has been on protonix for some time, would recommend change in PPI therapy to omeprazole to see if this better controls his GERD.    PLAN:  Continue to follow with hematology  2. Will start Entyvio 300mg  IV x1 at week 0/2, 108mg [place] q2weeks at week 6 3.  Stop pantoprazole, start omeprazole 40mg  daily   All questions were  answered, patient verbalized understanding and is in agreement with plan as outlined above.   Follow Up: 8 weeks    L. Jeanmarie Hubert, MSN, APRN, AGNP-C Adult-Gerontology Nurse Practitioner [facility]  I have reviewed the note and agree with the APP's assessment as described in this progress note  Katrinka Blazing, MD Gastroenterology and Hepatology [facility]

## 2023-05-29 ENCOUNTER — Other Ambulatory Visit (INDEPENDENT_AMBULATORY_CARE_PROVIDER_SITE_OTHER): Payer: Self-pay | Admitting: *Deleted

## 2023-05-29 ENCOUNTER — Other Ambulatory Visit (INDEPENDENT_AMBULATORY_CARE_PROVIDER_SITE_OTHER): Payer: Self-pay

## 2023-05-30 ENCOUNTER — Ambulatory Visit: Payer: PPO | Attending: Cardiology | Admitting: Cardiology

## 2023-05-30 ENCOUNTER — Encounter: Payer: Self-pay | Admitting: Cardiology

## 2023-05-30 VITALS — BP 128/68 | HR 54 | Ht 68.0 in | Wt 156.6 lb

## 2023-05-30 DIAGNOSIS — I4821 Permanent atrial fibrillation: Secondary | ICD-10-CM | POA: Diagnosis not present

## 2023-05-30 DIAGNOSIS — M25551 Pain in right hip: Secondary | ICD-10-CM | POA: Diagnosis not present

## 2023-05-30 DIAGNOSIS — I2510 Atherosclerotic heart disease of native coronary artery without angina pectoris: Secondary | ICD-10-CM

## 2023-05-30 DIAGNOSIS — M6281 Muscle weakness (generalized): Secondary | ICD-10-CM | POA: Diagnosis not present

## 2023-05-30 DIAGNOSIS — R2681 Unsteadiness on feet: Secondary | ICD-10-CM | POA: Diagnosis not present

## 2023-05-30 DIAGNOSIS — M545 Low back pain, unspecified: Secondary | ICD-10-CM | POA: Diagnosis not present

## 2023-05-30 NOTE — Progress Notes (Signed)
    Cardiology Office Note  Date: 05/30/2023   ID: [name], DOB 18-Jan-1931, MRN [id number]  History of Present Illness: [name] is a 89 y.o. male last seen in April by Ms. Philis Nettle NP.  He is here today with his wife for a follow-up visit.  Reports no sense of palpitations and no exertional chest pain with typical ADLs.  Leg swelling has improved and he has not had to use any as needed Lasix recently.  He remains on Coumadin with follow-up in the anticoagulation clinic.  No reported spontaneous bleeding problems.  Heart rate is controlled today on combination of Coreg and Cardizem CD.  I did review his interval lab work.  Physical Exam: VS:  BP 128/68   Pulse (!) 54   Ht 5\' 8"  (1.727 m)   Wt 156 lb 9.6 oz (71 kg)   SpO2 99%   BMI 23.81 kg/m , BMI Body mass index is 23.81 kg/m.  Wt Readings from Last 3 Encounters:  05/30/23 156 lb 9.6 oz (71 kg)  05/28/23 155 lb (70.3 kg)  05/22/23 154 lb 6.4 oz (70 kg)    General: Patient appears comfortable at rest. HEENT: Conjunctiva and lids normal. Neck: Supple, no elevated JVP or carotid bruits. Lungs: Clear to auscultation, nonlabored breathing at rest. Cardiac: Irregularly irregular, 1/6 systolic murmur.. Extremities: No pitting edema.  ECG:  An ECG dated 01/14/2023 was personally reviewed today and demonstrated:  Rate controlled atrial fibrillation with poor R wave progression and PVCs, low voltage in the limb leads.  Labwork: 10/10/2022: Magnesium 1.4 10/29/2022: ALT 14; AST 23 04/03/2023: BUN 22; Creat 1.76; Potassium 4.3; Sodium 139 05/22/2023: Hemoglobin 14.5; Platelets 186  August 2024: Cholesterol 173, triglycerides 89, HDL 44, LDL 112, TSH 3.21, BUN 19, creatinine 1.65, potassium 4.3, AST 18, ALT 11  Other Studies Reviewed Today:  Echocardiogram 10/01/2022:  1. Left ventricular ejection fraction, by estimation, is 50 to 55%. The  left ventricle has low normal function. The left ventricle has no regional  wall motion  abnormalities. Left ventricular diastolic parameters are  indeterminate.   2. Right ventricular systolic function is normal. The right ventricular  size is normal. There is normal pulmonary artery systolic pressure.   3. Large pleural effusion in the left lateral region.   4. The mitral valve is grossly normal. Mild mitral valve regurgitation.  No evidence of mitral stenosis.   5. The aortic valve is tricuspid. There is mild calcification of the  aortic valve. There is mild thickening of the aortic valve. Aortic valve  regurgitation is trivial. No aortic stenosis is present.   6. The inferior vena cava is dilated in size with >50% respiratory  variability, suggesting right atrial pressure of 8 mmHg.   Assessment and Plan:  1.  Permanent atrial fibrillation with CHA2DS2-VASc score of 3.  He is symptomatically stable with adequate heart rate control on combination of Coreg and Cardizem CD.  He remains on Coumadin for stroke prophylaxis with follow-up in the anticoagulation clinic.  No reported spontaneous bleeding problems.  2.  Nonobstructive CAD documented by cardiac catheterization in 2004.  Follow-up Myoview in 2018 was low risk without active ischemia.  Echocardiogram in December 2023 revealed LVEF 50 to 55%.  He does not describe any angina with typical ADLs.  He has as needed nitroglycerin available.  Disposition:  Follow up  6 months.  Signed, Jonelle Sidle, M.D., F.A.C.C. [facility]

## 2023-05-30 NOTE — Patient Instructions (Addendum)

## 2023-05-31 ENCOUNTER — Telehealth: Payer: Self-pay

## 2023-05-31 ENCOUNTER — Ambulatory Visit (HOSPITAL_COMMUNITY)
Admission: RE | Admit: 2023-05-31 | Discharge: 2023-05-31 | Disposition: A | Discharge status: Home / Self Care | Payer: PPO | Source: Ambulatory Visit | Attending: Hematology | Admitting: Hematology

## 2023-05-31 DIAGNOSIS — D696 Thrombocytopenia, unspecified: Secondary | ICD-10-CM | POA: Diagnosis not present

## 2023-05-31 NOTE — Telephone Encounter (Signed)
Auth Submission: NO AUTH NEEDED Site of care: Site of care: [facility] Payer: [employer] Medication & CPT/J Code(s) submitted: Entyvio (Vedolizumab) [id number] Route of submission (phone, fax, portal): phone Phone # Fax # Auth type: Buy/Bill PB Units/visits requested: 300mg , q2weeks Reference number: [id number] Approval from: 05/31/23 to 09/30/23

## 2023-06-01 ENCOUNTER — Telehealth (INDEPENDENT_AMBULATORY_CARE_PROVIDER_SITE_OTHER): Payer: Self-pay | Admitting: *Deleted

## 2023-06-01 DIAGNOSIS — M545 Low back pain, unspecified: Secondary | ICD-10-CM | POA: Diagnosis not present

## 2023-06-01 NOTE — Telephone Encounter (Signed)
Pt scheduled for first entyvio infusion on 06/05/23. Will need 2nd infusion 2 weeks after first then auth for injections needs to be done starting at week 6.

## 2023-06-04 ENCOUNTER — Telehealth (INDEPENDENT_AMBULATORY_CARE_PROVIDER_SITE_OTHER): Payer: Self-pay

## 2023-06-04 DIAGNOSIS — E039 Hypothyroidism, unspecified: Secondary | ICD-10-CM | POA: Diagnosis not present

## 2023-06-04 DIAGNOSIS — G2581 Restless legs syndrome: Secondary | ICD-10-CM | POA: Diagnosis not present

## 2023-06-04 DIAGNOSIS — I10 Essential (primary) hypertension: Secondary | ICD-10-CM | POA: Diagnosis not present

## 2023-06-04 DIAGNOSIS — E7801 Familial hypercholesterolemia: Secondary | ICD-10-CM | POA: Diagnosis not present

## 2023-06-04 DIAGNOSIS — K56600 Partial intestinal obstruction, unspecified as to cause: Secondary | ICD-10-CM | POA: Diagnosis not present

## 2023-06-04 DIAGNOSIS — N1330 Unspecified hydronephrosis: Secondary | ICD-10-CM | POA: Diagnosis not present

## 2023-06-04 DIAGNOSIS — R7301 Impaired fasting glucose: Secondary | ICD-10-CM | POA: Diagnosis not present

## 2023-06-04 DIAGNOSIS — R42 Dizziness and giddiness: Secondary | ICD-10-CM | POA: Diagnosis not present

## 2023-06-04 DIAGNOSIS — D649 Anemia, unspecified: Secondary | ICD-10-CM | POA: Diagnosis not present

## 2023-06-04 DIAGNOSIS — R531 Weakness: Secondary | ICD-10-CM | POA: Diagnosis not present

## 2023-06-04 DIAGNOSIS — N1832 Chronic kidney disease, stage 3b: Secondary | ICD-10-CM | POA: Diagnosis not present

## 2023-06-04 NOTE — Telephone Encounter (Signed)
I do not think it would be safe to remove it as the clip is embedded in the small bowel. It would require a surgery to remove it. May want to consider other imaging of the back and hip such as a CT scan

## 2023-06-04 NOTE — Telephone Encounter (Signed)
Patient in need of an MRI on his back and hip per Pain management. Patient wants to know if safe to do so since we place a hemostatic clip in him las October 05, 2022.

## 2023-06-04 NOTE — Telephone Encounter (Signed)
Patient made aware the MRI would not be a safe route, and it would require surgery to remove it. Patient says he will call his pain management and see what they recommend.

## 2023-06-05 ENCOUNTER — Encounter: Payer: PPO | Attending: Gastroenterology | Admitting: Emergency Medicine

## 2023-06-05 VITALS — BP 157/63 | HR 45 | Temp 98.4°F

## 2023-06-05 DIAGNOSIS — K50018 Crohn's disease of small intestine with other complication: Secondary | ICD-10-CM

## 2023-06-05 MED ORDER — VEDOLIZUMAB 300 MG IV SOLR
300.0000 mg | Freq: Once | INTRAVENOUS | Status: AC
  Administered 2023-06-05: 300 mg via INTRAVENOUS
  Filled 2023-06-05: qty 5

## 2023-06-05 NOTE — Progress Notes (Signed)
Diagnosis: Crohn's Disease  Provider:  Doylene Bode NP  Procedure: IV Infusion  IV Type: Peripheral, IV Location: L Hand   Dose: 300 mg Entyvio  Infusion Start Time: 1344  Infusion Stop Time: 1428  Post Infusion IV Care: Observation period completed and Peripheral IV Discontinued  Discharge: Condition: Good, Destination: Home . AVS Provided  Performed by:  Feliberto Harts, LPN

## 2023-06-06 DIAGNOSIS — M25551 Pain in right hip: Secondary | ICD-10-CM | POA: Diagnosis not present

## 2023-06-06 DIAGNOSIS — M545 Low back pain, unspecified: Secondary | ICD-10-CM | POA: Diagnosis not present

## 2023-06-06 DIAGNOSIS — M6281 Muscle weakness (generalized): Secondary | ICD-10-CM | POA: Diagnosis not present

## 2023-06-06 DIAGNOSIS — R2681 Unsteadiness on feet: Secondary | ICD-10-CM | POA: Diagnosis not present

## 2023-06-06 NOTE — Progress Notes (Signed)
[facility] [address] [place] [address]   Clinic Day:  06/07/23    Referring physician: Estanislado Pandy, MD  Patient Care Team: Estanislado Pandy, MD as PCP - General (Cardiology) Jonelle Sidle, MD as PCP - Cardiology (Cardiology)   ASSESSMENT & PLAN:   Assessment:  1.  Thrombocytopenia: - Patient seen at the request of Dr. Levon Hedger for thrombocytopenia. - He was started on Stelara infusion in January 2024, subsequently received subcutaneous injection on 12/25/2022 on 02/19/2023. - Platelet count on 01/14/2023 was normal at 154.  On 03/27/2023 and 04/03/2023, it was 41 and 43 respectively.  Platelet count on 04/30/2023 on 05/03/2023 showed platelet clumps.  It improved to 67K on 05/09/2023.  However there was a note about aggregation. - He denies any easy bruising or bleeding.  He has been on Coumadin for many years for atrial fibrillation. - He is being planned to be switched to [facility] for his Crohn's disease should his platelet count improved.  2.  Social/family history: - He lives at home with his wife.  Independent of ADLs and IADLs.  He worked for [employer] and [employer].  Non-smoker. - No family history of thrombocytopenia or leukemias.  Mother had colon cancer.  Plan:  1.  Thrombocytopenia: - Reviewed labs from 05/22/2023: Repeat CBC showed platelet count improved to 186.  Connective tissue disorder workup and infectious etiologies was negative.  SPEP panel was negative. - Ultrasound spleen (05/31/2023): Spleen measures 9.9 x 6 x 10.6 cm with volume 330 cc.  Overall normal in size. - He started first dose of Entyvio on 06/05/2023. - RTC 2 months for follow-up with repeat CBC.  2.  Vitamin B12 deficiency: - B12 level was normal at 269.  However methylmalonic acid was elevated at 1641. - Will give him B12 injection today.  He will start B12 1 mg tablet daily.  I will repeat MMA, B12 in 2 months.   Orders Placed This Encounter  Procedures   CBC with  Differential    Standing Status:   Future    Standing Expiration Date:   06/06/2024   Vitamin B12    Standing Status:   Future    Standing Expiration Date:   06/06/2024   Methylmalonic acid, serum    Standing Status:   Future    Standing Expiration Date:   06/06/2024      Alben Deeds Teague,acting as a scribe for Doreatha Massed, MD.,have documented all relevant documentation on the behalf of Doreatha Massed, MD,as directed by  Doreatha Massed, MD while in the presence of Doreatha Massed, MD.  I, Doreatha Massed MD, have reviewed the above documentation for accuracy and completeness, and I agree with the above.    Doreatha Massed, MD   8/22/20246:10 PM  CHIEF COMPLAINT/PURPOSE OF CONSULT:   Diagnosis: Thrombocytopenia  Current Therapy: Under workup  HISTORY OF PRESENT ILLNESS:   [name] is a 89 y.o. male presenting to clinic today for evaluation of thrombocytopenia at the request of Dr. Levon Hedger.  Today, he states that he is doing well overall. His appetite level is at 90%. His energy level is at 10%.  He was found to have severely low platelets at 67 on 05/09/23 from a platelet count lab. POCT INR panel on 05/13/23 was normal. He was found to have abnormal CBC from 05/03/23 that found decreased hemoglobin at 12.6. Fecal calprotectin lab on 05/03/23 was elevated at 186.    INTERVAL HISTORY:   Anthony  D [name] is a 89 y.o. male presenting to the clinic today for follow-up of Thrombocytopenia. He was last seen by me on 05/22/23 in consultation.  Since his last visit, he underwent an US of the spleen on 05/31/23 that found: no acute abnormality, normal spleen size, and a 2.3 cm cyst in the upper spleen.   Today, he states that he is doing well overall. His appetite level is at 90%. His energy level is at 60%. He is accompanied by his wife.   He notes he had Entyvio infusion on 06/05/23 with Dr. Levon Hedger. His next infusion is scheduled for 06/19/23 with Dr. Levon Hedger.    He c/o arthritis pain and had a steroid shot that improved the pain for 2 days. He has a metallic clip embedded in his colon and is unable to get an MRI done.   PAST MEDICAL HISTORY:   Past Medical History: Past Medical History:  Diagnosis Date   Anemia    Atrial fibrillation (HCC)    BPH (benign prostatic hyperplasia)    CAD (coronary artery disease)    Catheterization 2004, mild/moderate nonobstructive disease  /   nuclear, 2007, small inferior scar // no ischemia   Crohn's disease (HCC)    Elevated PSA    GERD (gastroesophageal reflux disease)    History of kidney stones    History of pneumonia    Hypothyroidism    Mitral regurgitation    Pneumonia    SBO (small bowel obstruction) (HCC)    Urinary retention    Wears glasses     Surgical History: Past Surgical History:  Procedure Laterality Date   AGILE CAPSULE  10/18/2011   Procedure: AGILE CAPSULE;  Surgeon: Malissa Hippo, MD;  [facility];  Service: Endoscopy;  Laterality: N/A;  730   BIOPSY  10/05/2022   Procedure: BIOPSY;  Surgeon: Dolores Frame, MD;  [facility];  Service: Gastroenterology;;   BIOPSY  01/02/2023   Procedure: BIOPSY;  Surgeon: Dolores Frame, MD;  [facility];  Service: Gastroenterology;;   CARDIAC CATHETERIZATION  2004   CATARACT EXTRACTION W/PHACO Right 06/27/2021   Procedure: CATARACT EXTRACTION PHACO AND INTRAOCULAR LENS PLACEMENT (IOC);  Surgeon: Fabio Pierce, MD;  Location: [facility];  Service: Ophthalmology;  Laterality: Right;  CDE 21.98   CATARACT EXTRACTION W/PHACO Left 07/11/2021   Procedure: CATARACT EXTRACTION PHACO AND INTRAOCULAR LENS PLACEMENT LEFT EYE;  Surgeon: Fabio Pierce, MD;  Location: [facility];  Service: Ophthalmology;  Laterality: Left;  CDE=10.42   CHOLECYSTECTOMY  2010   Dr. Gabriel Cirri   COLONOSCOPY  2008   [facility]   COLONOSCOPY N/A 03/23/2016   Procedure: COLONOSCOPY;  Surgeon: Malissa Hippo, MD;  [facility];  Service: Endoscopy;  Laterality: N/A;  1:00   CYSTOSCOPY WITH INSERTION OF UROLIFT     ESOPHAGEAL BRUSHING  10/05/2022   Procedure: ESOPHAGEAL BRUSHING;  Surgeon: Dolores Frame, MD;  [facility];  Service: Gastroenterology;;   ESOPHAGOGASTRODUODENOSCOPY  11/08/2011   Procedure: ESOPHAGOGASTRODUODENOSCOPY (EGD);  Surgeon: Malissa Hippo, MD;  [facility];  Service: Endoscopy;  Laterality: N/A;  300   ESOPHAGOGASTRODUODENOSCOPY (EGD) WITH PROPOFOL N/A 10/05/2022   Procedure: ESOPHAGOGASTRODUODENOSCOPY (EGD) WITH PROPOFOL;  Surgeon: Dolores Frame, MD;  [facility];  Service: Gastroenterology;  Laterality: N/A;   ESOPHAGOGASTRODUODENOSCOPY (EGD) WITH PROPOFOL N/A 01/02/2023   Procedure: ESOPHAGOGASTRODUODENOSCOPY (EGD) WITH PROPOFOL;  Surgeon: Dolores Frame, MD;  [facility];  Service: Gastroenterology;  Laterality: N/A;  1:30 pm, asa 3   HEMOSTASIS CLIP PLACEMENT  10/05/2022   Procedure: HEMOSTASIS CLIP PLACEMENT;  Surgeon: Dolores Frame, MD;  [facility];  Service: Gastroenterology;;   POLYPECTOMY  03/23/2016   Procedure: POLYPECTOMY;  Surgeon: Malissa Hippo, MD;  [facility];  Service: Endoscopy;;  Cecal polyp removed via cold forceps recto-sigmoid polyp removed via cold snare   POLYPECTOMY  10/05/2022   Procedure: POLYPECTOMY;  Surgeon: Dolores Frame, MD;  [facility];  Service: Gastroenterology;;   TRANSURETHRAL RESECTION OF PROSTATE N/A 04/29/2020   Procedure: TRANSURETHRAL RESECTION OF THE PROSTATE (TURP);  Surgeon: Marcine Matar, MD;  Location: [facility];  Service: Urology;  Laterality: N/A;  20 MINS    Social History: Social History   Socioeconomic History   Marital status: Married    Spouse name: Not on file   Number of children: Not on file   Years of education: Not on file   Highest education level: Not on file   Occupational History   Occupation: Public house manager: RETIRED  Tobacco Use   Smoking status: Never    Passive exposure: Never   Smokeless tobacco: Never  Vaping Use   Vaping status: Never Used  Substance and Sexual Activity   Alcohol use: No    Alcohol/week: 0.0 standard drinks of alcohol   Drug use: No   Sexual activity: Not on file  Other Topics Concern   Not on file  Social History Narrative   Married   Social Determinants of Health   Financial Resource Strain: Not on file  Food Insecurity: No Food Insecurity (10/02/2022)   Hunger Vital Sign    Worried About Running Out of Food in the Last Year: Never true    Ran Out of Food in the Last Year: Never true  Transportation Needs: No Transportation Needs (10/02/2022)   PRAPARE - Administrator, Civil Service (Medical): No    Lack of Transportation (Non-Medical): No  Physical Activity: Not on file  Stress: Not on file  Social Connections: Not on file  Intimate Partner Violence: Not At Risk (10/02/2022)   Humiliation, Afraid, Rape, and Kick questionnaire    Fear of Current or Ex-Partner: No    Emotionally Abused: No    Physically Abused: No    Sexually Abused: No    Family History: Family History  Problem Relation Age of Onset   Colon cancer Mother    Heart disease Father    Stroke Brother    Healthy Daughter     Current Medications:  Current Outpatient Medications:    acetaminophen (TYLENOL) 500 MG tablet, Take 500 mg by mouth every 6 (six) hours as needed for mild pain or moderate pain., Disp: , Rfl:    CALCIUM PO, Take 300 mg by mouth in the morning and at bedtime., Disp: , Rfl:    carvedilol (COREG) 12.5 MG tablet, Take 1 tablet (12.5 mg total) by mouth 2 (two) times daily., Disp: 60 tablet, Rfl: 0   Cholecalciferol (VITAMIN D3) 50 MCG (2000 UT) TABS, Take 2,000 Units by mouth daily., Disp: , Rfl:    diltiazem (CARDIZEM CD) 180 MG 24 hr capsule, Take 180 mg by mouth 2 (two)  times daily., Disp: , Rfl:    ferrous sulfate (FEROSUL) 325 (65 FE) MG tablet, Take 325 mg by mouth daily with breakfast., Disp: , Rfl:    fluticasone (FLONASE) 50  MCG/ACT nasal spray, INSTILL 2 SPRAYS EVERY DAY, Disp: , Rfl:    furosemide (LASIX) 40 MG tablet, Take 1 tablet (40 mg total) by mouth daily as needed (leg swelling). 03/20/2023 NEW, Disp: 30 tablet, Rfl: 3   levothyroxine (SYNTHROID, LEVOTHROID) 75 MCG tablet, Take 75 mcg by mouth daily before breakfast., Disp: , Rfl:    magnesium oxide (MAG-OX) 400 (240 Mg) MG tablet, Take 400 mg by mouth 2 (two) times daily., Disp: , Rfl:    Melatonin 10 MG TABS, Take 10 mg by mouth at bedtime., Disp: , Rfl:    nitroGLYCERIN (NITROSTAT) 0.4 MG SL tablet, Place 0.4 mg under the tongue every 5 (five) minutes as needed. For chest pains. May repeat for up to 3 doses., Disp: , Rfl:    omeprazole (PRILOSEC) 40 MG capsule, Take 1 capsule (40 mg total) by mouth daily., Disp: 90 capsule, Rfl: 1   Probiotic Product (ALIGN) 4 MG CAPS, Take 4 mg by mouth in the morning., Disp: , Rfl:    traZODone (DESYREL) 50 MG tablet, Take 50 mg by mouth at bedtime., Disp: , Rfl:    warfarin (COUMADIN) 2.5 MG tablet, TAKE 1 TABLET DAILY EXCEPT 1/2 TABLET ON TUESDAYS OR AS DIRECTED, Disp: 30 tablet, Rfl: 5   Allergies: No Known Allergies  REVIEW OF SYSTEMS:   Review of Systems  Constitutional:  Negative for chills, fatigue and fever.  HENT:   Negative for lump/mass, mouth sores, nosebleeds, sore throat and trouble swallowing.   Eyes:  Negative for eye problems.  Respiratory:  Negative for cough and shortness of breath.   Cardiovascular:  Negative for chest pain, leg swelling and palpitations.  Gastrointestinal:  Negative for abdominal pain, constipation, diarrhea, nausea and vomiting.  Genitourinary:  Negative for bladder incontinence, difficulty urinating, dysuria, frequency, hematuria and nocturia.   Musculoskeletal:  Negative for arthralgias, back pain, flank pain,  myalgias and neck pain.       +arthritis pain, 6/10 severity  Skin:  Negative for itching and rash.  Neurological:  Negative for dizziness, headaches and numbness.  Hematological:  Does not bruise/bleed easily.  Psychiatric/Behavioral:  Negative for depression, sleep disturbance and suicidal ideas. The patient is not nervous/anxious.   All other systems reviewed and are negative.    VITALS:   Blood pressure (!) 154/68, pulse 74, temperature 98.1 F (36.7 C), temperature source Oral, resp. rate 18, height 5\' 8"  (1.727 m), weight 155 lb 9.6 oz (70.6 kg), SpO2 100%.  Wt Readings from Last 3 Encounters:  06/07/23 155 lb 9.6 oz (70.6 kg)  05/30/23 156 lb 9.6 oz (71 kg)  05/28/23 155 lb (70.3 kg)    Body mass index is 23.66 kg/m.   PHYSICAL EXAM:   Physical Exam Vitals and nursing note reviewed. Exam conducted with a chaperone present.  Constitutional:      Appearance: Normal appearance.  Cardiovascular:     Rate and Rhythm: Normal rate and regular rhythm.     Pulses: Normal pulses.     Heart sounds: Normal heart sounds.  Pulmonary:     Effort: Pulmonary effort is normal.     Breath sounds: Normal breath sounds.  Abdominal:     Palpations: Abdomen is soft. There is no hepatomegaly, splenomegaly or mass.     Tenderness: There is no abdominal tenderness.  Musculoskeletal:     Right lower leg: No edema.     Left lower leg: No edema.  Lymphadenopathy:     Cervical: No cervical adenopathy.  Right cervical: No superficial, deep or posterior cervical adenopathy.    Left cervical: No superficial, deep or posterior cervical adenopathy.     Upper Body:     Right upper body: No supraclavicular or axillary adenopathy.     Left upper body: No supraclavicular or axillary adenopathy.  Neurological:     General: No focal deficit present.     Mental Status: He is alert and oriented to person, place, and time.  Psychiatric:        Mood and Affect: Mood normal.        Behavior:  Behavior normal.     LABS:      Latest Ref Rng & Units 05/22/2023   10:11 AM 05/09/2023    8:58 AM 05/03/2023    8:44 AM  CBC  WBC 4.0 - 10.5 K/uL 5.9   6.1   Hemoglobin 13.0 - 17.0 g/dL 16.1   09.6   Hematocrit 39.0 - 52.0 % 43.1   38.7   Platelets 150 - 400 K/uL 186  67  CANCELED       Latest Ref Rng & Units 04/03/2023    8:28 AM 01/14/2023    9:58 AM 12/29/2022   10:57 AM  CMP  Glucose 65 - 99 mg/dL 96  045  409   BUN 7 - 25 mg/dL 22  19  16    Creatinine 0.70 - 1.22 mg/dL 8.11  9.14  7.82   Sodium 135 - 146 mmol/L 139  136  137   Potassium 3.5 - 5.3 mmol/L 4.3  3.4  3.3   Chloride 98 - 110 mmol/L 103  105  104   CO2 20 - 32 mmol/L 28  23  23    Calcium 8.6 - 10.3 mg/dL 9.6  8.4  8.5      No results found for: "CEA1", "CEA" / No results found for: "CEA1", "CEA" No results found for: "PSA1" No results found for: "CAN199" No results found for: "CAN125"  Lab Results  Component Value Date   TOTALPROTELP 7.1 05/22/2023   TOTALPROTELP 7.5 05/22/2023   ALBUMINELP 4.0 05/22/2023   A1GS 0.2 05/22/2023   A2GS 0.7 05/22/2023   BETS 1.0 05/22/2023   GAMS 1.1 05/22/2023   MSPIKE Not Observed 05/22/2023   SPEI Comment 05/22/2023   Lab Results  Component Value Date   TIBC 349 05/22/2023   FERRITIN 96 05/22/2023   IRONPCTSAT 23 05/22/2023   Lab Results  Component Value Date   LDH 145 05/22/2023     STUDIES:   US SPLEEN (ABDOMEN LIMITED)  Result Date: 05/31/2023 CLINICAL DATA:  Thrombocytopenia. EXAM: ULTRASOUND ABDOMEN LIMITED COMPARISON:  CT abdomen pelvis March 8th 2024 FINDINGS: The spleen measures 9.9 x 6 x 10.6 cm with volume of 330.2. There is a cyst in the upper spleen measuring 2 x 2.3 x 2.1 cm unchanged compared to prior CT. IMPRESSION: 1. No acute abnormality.  Normal spleen size. 2. 2.3 cm cyst in the upper spleen. Electronically Signed   By: Sherian Rein M.D.   On: 05/31/2023 11:28

## 2023-06-07 ENCOUNTER — Inpatient Hospital Stay: Payer: PPO

## 2023-06-07 ENCOUNTER — Inpatient Hospital Stay: Payer: PPO | Admitting: Hematology

## 2023-06-07 VITALS — BP 154/68 | HR 74 | Temp 98.1°F | Resp 18 | Ht 68.0 in | Wt 155.6 lb

## 2023-06-07 DIAGNOSIS — D696 Thrombocytopenia, unspecified: Secondary | ICD-10-CM

## 2023-06-07 DIAGNOSIS — E538 Deficiency of other specified B group vitamins: Secondary | ICD-10-CM

## 2023-06-07 MED ORDER — CYANOCOBALAMIN 1000 MCG/ML IJ SOLN
1000.0000 ug | Freq: Once | INTRAMUSCULAR | Status: AC
  Administered 2023-06-07: 1000 ug via INTRAMUSCULAR
  Filled 2023-06-07: qty 1

## 2023-06-07 NOTE — Patient Instructions (Signed)
[facility]  Discharge Instructions  You were seen and examined today by Dr. Ellin Saba.  Dr. Ellin Saba discussed your most recent lab work which revealed normalized platelets. Your Vitamin B12 is low, please start taking 1000 mcg (1mg ) of Vitamin B12 over the counter.  You will get a Vitamin B12 injection today.  Follow-up as scheduled.  Thank you for choosing [facility] to provide your oncology and hematology care.   To afford each patient quality time with our provider, please arrive at least 15 minutes before your scheduled appointment time. You may need to reschedule your appointment if you arrive late (10 or more minutes). Arriving late affects you and other patients whose appointments are after yours.  Also, if you miss three or more appointments without notifying the office, you may be dismissed from the clinic at the provider's discretion.    Again, thank you for choosing [facility].  Our hope is that these requests will decrease the amount of time that you wait before being seen by our physicians.   If you have a lab appointment with the Cancer Center - please note that after April 8th, all labs will be drawn in the cancer center.  You do not have to check in or register with the main entrance as you have in the past but will complete your check-in at the cancer center.            _____________________________________________________________  Should you have questions after your visit to [facility], please contact our office at [phone] and follow the prompts.  Our office hours are 8:00 a.m. to 4:30 p.m. Monday - Thursday and 8:00 a.m. to 2:30 p.m. Friday.  Please note that voicemails left after 4:00 p.m. may not be returned until the following business day.  We are closed weekends and all major holidays.  You do have access to a nurse 24-7, just call the main number to the clinic [phone]  and do not press any options, hold on the line and a nurse will answer the phone.    For prescription refill requests, have your pharmacy contact our office and allow 72 hours.    Masks are no longer required in the cancer centers. If you would like for your care team to wear a mask while they are taking care of you, please let them know. You may have one support person who is at least 87 years old accompany you for your appointments.

## 2023-06-07 NOTE — Progress Notes (Signed)
B12 injection given per order in right deltoid without incident.  Patient tolerated well.  Site CDI.  Discharged ambulatory in stable condition with wife.

## 2023-06-07 NOTE — Telephone Encounter (Signed)
2/2 entyvio infusion scheduled 9/3. Will need to submit auth for injections after he competes infusion.

## 2023-06-08 ENCOUNTER — Other Ambulatory Visit: Payer: Self-pay | Admitting: Anesthesiology

## 2023-06-08 DIAGNOSIS — M545 Low back pain, unspecified: Secondary | ICD-10-CM

## 2023-06-10 ENCOUNTER — Other Ambulatory Visit: Payer: Self-pay

## 2023-06-10 ENCOUNTER — Encounter (HOSPITAL_COMMUNITY): Payer: Self-pay

## 2023-06-10 ENCOUNTER — Emergency Department (HOSPITAL_COMMUNITY)
Admission: EM | Admit: 2023-06-10 | Discharge: 2023-06-10 | Disposition: A | Discharge status: Home / Self Care | Payer: PPO | Attending: Emergency Medicine | Admitting: Emergency Medicine

## 2023-06-10 ENCOUNTER — Emergency Department (HOSPITAL_COMMUNITY): Payer: PPO

## 2023-06-10 DIAGNOSIS — R110 Nausea: Secondary | ICD-10-CM | POA: Insufficient documentation

## 2023-06-10 DIAGNOSIS — R0602 Shortness of breath: Secondary | ICD-10-CM

## 2023-06-10 DIAGNOSIS — Z7901 Long term (current) use of anticoagulants: Secondary | ICD-10-CM | POA: Diagnosis not present

## 2023-06-10 DIAGNOSIS — I4891 Unspecified atrial fibrillation: Secondary | ICD-10-CM | POA: Insufficient documentation

## 2023-06-10 DIAGNOSIS — I10 Essential (primary) hypertension: Secondary | ICD-10-CM | POA: Insufficient documentation

## 2023-06-10 DIAGNOSIS — J029 Acute pharyngitis, unspecified: Secondary | ICD-10-CM | POA: Diagnosis not present

## 2023-06-10 DIAGNOSIS — R519 Headache, unspecified: Secondary | ICD-10-CM | POA: Diagnosis not present

## 2023-06-10 DIAGNOSIS — Z79899 Other long term (current) drug therapy: Secondary | ICD-10-CM | POA: Diagnosis not present

## 2023-06-10 DIAGNOSIS — Z1152 Encounter for screening for COVID-19: Secondary | ICD-10-CM | POA: Diagnosis not present

## 2023-06-10 LAB — CBC WITH DIFFERENTIAL/PLATELET
Abs Immature Granulocytes: 0.03 10*3/uL (ref 0.00–0.07)
Basophils Absolute: 0.1 10*3/uL (ref 0.0–0.1)
Basophils Relative: 1 %
Eosinophils Absolute: 0.1 10*3/uL (ref 0.0–0.5)
Eosinophils Relative: 2 %
HCT: 41.6 % (ref 39.0–52.0)
Hemoglobin: 14 g/dL (ref 13.0–17.0)
Immature Granulocytes: 1 %
Lymphocytes Relative: 23 %
Lymphs Abs: 1.5 10*3/uL (ref 0.7–4.0)
MCH: 30.4 pg (ref 26.0–34.0)
MCHC: 33.7 g/dL (ref 30.0–36.0)
MCV: 90.2 fL (ref 80.0–100.0)
Monocytes Absolute: 0.4 10*3/uL (ref 0.1–1.0)
Monocytes Relative: 6 %
Neutro Abs: 4.3 10*3/uL (ref 1.7–7.7)
Neutrophils Relative %: 67 %
Platelets: 143 10*3/uL — ABNORMAL LOW (ref 150–400)
RBC: 4.61 MIL/uL (ref 4.22–5.81)
RDW: 14 % (ref 11.5–15.5)
WBC: 6.4 10*3/uL (ref 4.0–10.5)
nRBC: 0 % (ref 0.0–0.2)

## 2023-06-10 LAB — COMPREHENSIVE METABOLIC PANEL
ALT: 13 U/L (ref 0–44)
AST: 19 U/L (ref 15–41)
Albumin: 4.1 g/dL (ref 3.5–5.0)
Alkaline Phosphatase: 57 U/L (ref 38–126)
Anion gap: 9 (ref 5–15)
BUN: 15 mg/dL (ref 8–23)
CO2: 26 mmol/L (ref 22–32)
Calcium: 9.3 mg/dL (ref 8.9–10.3)
Chloride: 102 mmol/L (ref 98–111)
Creatinine, Ser: 1.43 mg/dL — ABNORMAL HIGH (ref 0.61–1.24)
GFR, Estimated: 47 mL/min — ABNORMAL LOW (ref >60)
Glucose, Bld: 108 mg/dL — ABNORMAL HIGH (ref 70–99)
Potassium: 4 mmol/L (ref 3.5–5.1)
Sodium: 137 mmol/L (ref 135–145)
Total Bilirubin: 0.9 mg/dL (ref 0.3–1.2)
Total Protein: 7.4 g/dL (ref 6.5–8.1)

## 2023-06-10 LAB — RESP PANEL BY RT-PCR (RSV, FLU A&B, COVID)  RVPGX2
Influenza A by PCR: NEGATIVE
Influenza B by PCR: NEGATIVE
Resp Syncytial Virus by PCR: NEGATIVE
SARS Coronavirus 2 by RT PCR: NEGATIVE

## 2023-06-10 NOTE — Discharge Instructions (Signed)
As discussed, your evaluation today has been largely reassuring.  But, it is important that you monitor your condition carefully, and do not hesitate to return to the ED if you develop new, or concerning changes in your condition. ? ?Otherwise, please follow-up with your physician for appropriate ongoing care. ? ?

## 2023-06-10 NOTE — ED Triage Notes (Signed)
Pt complains of SOB, sore throat, headache, back pain,nausea, difficulty swallowing and "irregular heartbeat". Pt states symptoms started this morning. Pt had first infusion of entyvio last week for Crohn's.

## 2023-06-10 NOTE — ED Provider Notes (Signed)
[place] EMERGENCY DEPARTMENT AT [facility] Provider Note   CSN: [id number] Arrival date & time: 06/10/23  2725     History  Chief Complaint  Patient presents with   Shortness of Breath   Sore Throat   Headache   Irregular Heart Beat    [name] is a 89 y.o. male.  HPI Patient presents with his wife who assists with the history.  Patient has multiple medical problems including A-fib, is anticoagulated, pneumonia earlier this year, Crohn disease, now presents with sore throat, slightly worsening dyspnea, generalized sense of illness and nausea.  Onset may have been a few days ago, though symptoms were more noticeable today prompting evaluation.  No focal pain according to the patient.  He did change one of his infusion medications last week otherwise no other recent lifestyle, medication, diet changes.    Home Medications Prior to Admission medications   Medication Sig Start Date End Date Taking? Authorizing Provider  acetaminophen (TYLENOL) 500 MG tablet Take 500 mg by mouth every 6 (six) hours as needed for mild pain or moderate pain.    [provider]  CALCIUM PO Take 300 mg by mouth in the morning and at bedtime.    [provider]  carvedilol (COREG) 12.5 MG tablet Take 1 tablet (12.5 mg total) by mouth 2 (two) times daily. 10/10/22 03/18/89  Sherryll Burger, Pratik D, DO  Cholecalciferol (VITAMIN D3) 50 MCG (2000 UT) TABS Take 2,000 Units by mouth daily.    [provider]  diltiazem (CARDIZEM CD) 180 MG 24 hr capsule Take 180 mg by mouth 2 (two) times daily.    [provider]  ferrous sulfate (FEROSUL) 325 (65 FE) MG tablet Take 325 mg by mouth daily with breakfast.    [provider]  fluticasone (FLONASE) 50 MCG/ACT nasal spray INSTILL 2 SPRAYS EVERY DAY 05/19/23   [provider]  furosemide (LASIX) 40 MG tablet Take 1 tablet (40 mg total) by mouth daily as needed (leg swelling). 03/20/2023 NEW 03/20/23   Jonelle Sidle, MD  levothyroxine (SYNTHROID, LEVOTHROID) 75 MCG tablet Take 75 mcg by mouth daily before breakfast.    [provider]  magnesium oxide (MAG-OX) 400 (240 Mg) MG tablet Take 400 mg by mouth 2 (two) times daily.    [provider]  Melatonin 10 MG TABS Take 10 mg by mouth at bedtime.    [provider]  nitroGLYCERIN (NITROSTAT) 0.4 MG SL tablet Place 0.4 mg under the tongue every 5 (five) minutes as needed. For chest pains. May repeat for up to 3 doses.    [provider]  omeprazole (PRILOSEC) 40 MG capsule Take 1 capsule (40 mg total) by mouth daily. 05/28/23   Carlan, Jeral Pinch, NP  Probiotic Product (ALIGN) 4 MG CAPS Take 4 mg by mouth in the morning.    [provider]  traZODone (DESYREL) 50 MG tablet Take 50 mg by mouth at bedtime.    [provider]  warfarin (COUMADIN) 2.5 MG tablet TAKE 1 TABLET DAILY EXCEPT 1/2 TABLET ON TUESDAYS OR AS DIRECTED 03/19/23   Jonelle Sidle, MD      Allergies    Patient has no known allergies.    Review of Systems   Review of Systems  All other systems reviewed and are negative.   Physical Exam Updated Vital Signs BP (!) 158/93   Pulse 81   Temp 98.2 F (36.8 C) (Oral)   Resp Marland Kitchen)  24   Ht 5\' 8"  (1.727 m)   Wt 70.6 kg   SpO2 97%   BMI 23.67 kg/m  Physical Exam Vitals and nursing note reviewed.  Constitutional:      General: He is not in acute distress.    Appearance: He is well-developed.  HENT:     Head: Normocephalic and atraumatic.  Eyes:     Conjunctiva/sclera: Conjunctivae normal.  Cardiovascular:     Rate and Rhythm: Normal rate. Rhythm irregular.  Pulmonary:     Effort: Pulmonary effort is normal. No respiratory distress.     Breath sounds: No stridor.  Abdominal:     General: There is no distension.  Skin:    General: Skin is warm and dry.  Neurological:     Mental Status: He is alert and oriented to person, place, and time.     ED Results / Procedures  / Treatments   Labs (all labs ordered are listed, but only abnormal results are displayed) Labs Reviewed  COMPREHENSIVE METABOLIC PANEL - Abnormal; Notable for the following components:      Result Value   Glucose, Bld 108 (*)    Creatinine, Ser 1.43 (*)    GFR, Estimated 47 (*)    All other components within normal limits  CBC WITH DIFFERENTIAL/PLATELET - Abnormal; Notable for the following components:   Platelets 143 (*)    All other components within normal limits  RESP PANEL BY RT-PCR (RSV, FLU A&B, COVID)  [id number]    EKG EKG Interpretation Date/Time:  Sunday June 10 2023 08:03:07 EDT Ventricular Rate:  73 PR Interval:    QRS Duration:  83 QT Interval:  370 QTC Calculation: 408 R Axis:   50  Text Interpretation: Atrial fibrillation Low voltage, extremity leads Probable anteroseptal infarct, old Confirmed by Gerhard Munch [phone]) on 06/10/2023 8:08:14 AM  Radiology DG Chest Port 1 View  Result Date: 06/10/2023 CLINICAL DATA:  89 year old male with history of shortness of breath. EXAM: PORTABLE CHEST 1 VIEW COMPARISON:  Chest x-ray 03/16/2023. FINDINGS: Lung volumes are normal. No consolidative airspace disease. No pleural effusions. No pneumothorax. No pulmonary nodule or mass noted. Pulmonary vasculature and the cardiomediastinal silhouette are within normal limits. IMPRESSION: No radiographic evidence of acute cardiopulmonary disease. Electronically Signed   By: Trudie Reed M.D.   On: 06/10/2023 08:48    Procedures Procedures    Medications Ordered in ED Medications - No data to display  ED Course/ Medical Decision Making/ A&P                                 Medical Decision Making Elderly male with multiple medical problems including thrombocytopenia, A-fib, anticoagulated, prior pneumonia now presents with generalized illness.  Patient is awake, alert, not hypotensive, or hypoxic.  Patient is mildly hypertensive, and broad differential including electrolyte  abnormalities, anemia, pneumonia, viral process considered. Cardiac 80s A-fib abnormal Pulse ox 97% room air normal   Amount and/or Complexity of Data Reviewed Independent Historian: spouse External Data Reviewed: notes. Labs: ordered. Decision-making details documented in ED Course. Radiology: ordered and independent interpretation performed. Decision-making details documented in ED Course. ECG/medicine tests: ordered and independent interpretation performed. Decision-making details documented in ED Course.  Risk Decision regarding hospitalization. Diagnosis or treatment significantly limited by social determinants of health.   9:42 AM On repeat exam the patient is awake, alert, in no distress, stating that he feels fine.  No hypoxia, increased  work of breathing, charted tachypnea is not present.  Labs reviewed, discussed x-ray reviewed, discussed, no pneumonia, bacteremia, sepsis, consideration of possible medication effect given his new infusion medication, with no evidence for other acute new pathology in the patient's description of feeling fine on repeat exam is reassuring.  Patient, wife will follow-up with his GI office tomorrow.        Final Clinical Impression(s) / ED Diagnoses Final diagnoses:  SOB (shortness of breath)    Rx / DC Orders ED Discharge Orders     None         Gerhard Munch, MD 06/10/23 [phone]

## 2023-06-11 ENCOUNTER — Telehealth (INDEPENDENT_AMBULATORY_CARE_PROVIDER_SITE_OTHER): Payer: Self-pay

## 2023-06-11 NOTE — Telephone Encounter (Signed)
Spoke to patient today, he presented shortness of breath, headache, sneezing and tachycardia 5 days after receiving Entyvio infusion.  The symptoms resolved on its own.  Unclear if they were related to the medication, but I think this is less likely as he was asymptomatic for 4 days.  He will proceed with his second dose of Entyvio and we will assess if he tolerates this medication.

## 2023-06-11 NOTE — Telephone Encounter (Signed)
Patient wife Kathie Rhodes called to report the patient went to [facility] Ed yesterday due to Shortness of breath, nausea, headache,sore throat, and irregular heart beat. Per the Ed doctor he thought this was a side effect from the Entyvio infusion the patient had on 06/05/2023. Patient is weak today and they are due to have another infusion on June 19, 2023. Patient is in [place] now and is able to come into the office today if needed. Please advise.

## 2023-06-12 DIAGNOSIS — R42 Dizziness and giddiness: Secondary | ICD-10-CM | POA: Diagnosis not present

## 2023-06-12 DIAGNOSIS — H6982 Other specified disorders of Eustachian tube, left ear: Secondary | ICD-10-CM | POA: Diagnosis not present

## 2023-06-12 DIAGNOSIS — H903 Sensorineural hearing loss, bilateral: Secondary | ICD-10-CM | POA: Diagnosis not present

## 2023-06-12 NOTE — Telephone Encounter (Signed)
Noted  

## 2023-06-13 DIAGNOSIS — L110 Acquired keratosis follicularis: Secondary | ICD-10-CM | POA: Diagnosis not present

## 2023-06-13 DIAGNOSIS — M6281 Muscle weakness (generalized): Secondary | ICD-10-CM | POA: Diagnosis not present

## 2023-06-13 DIAGNOSIS — M79675 Pain in left toe(s): Secondary | ICD-10-CM | POA: Diagnosis not present

## 2023-06-13 DIAGNOSIS — M25551 Pain in right hip: Secondary | ICD-10-CM | POA: Diagnosis not present

## 2023-06-13 DIAGNOSIS — M545 Low back pain, unspecified: Secondary | ICD-10-CM | POA: Diagnosis not present

## 2023-06-13 DIAGNOSIS — I739 Peripheral vascular disease, unspecified: Secondary | ICD-10-CM | POA: Diagnosis not present

## 2023-06-13 DIAGNOSIS — M79671 Pain in right foot: Secondary | ICD-10-CM | POA: Diagnosis not present

## 2023-06-13 DIAGNOSIS — R2681 Unsteadiness on feet: Secondary | ICD-10-CM | POA: Diagnosis not present

## 2023-06-13 DIAGNOSIS — M79674 Pain in right toe(s): Secondary | ICD-10-CM | POA: Diagnosis not present

## 2023-06-13 DIAGNOSIS — M79672 Pain in left foot: Secondary | ICD-10-CM | POA: Diagnosis not present

## 2023-06-13 NOTE — Discharge Instructions (Signed)

## 2023-06-14 ENCOUNTER — Ambulatory Visit
Admission: RE | Admit: 2023-06-14 | Discharge: 2023-06-14 | Disposition: A | Discharge status: Home / Self Care | Payer: PPO | Source: Ambulatory Visit | Attending: Anesthesiology | Admitting: Anesthesiology

## 2023-06-14 DIAGNOSIS — M545 Low back pain, unspecified: Secondary | ICD-10-CM

## 2023-06-14 DIAGNOSIS — M4316 Spondylolisthesis, lumbar region: Secondary | ICD-10-CM | POA: Diagnosis not present

## 2023-06-14 DIAGNOSIS — M5136 Other intervertebral disc degeneration, lumbar region: Secondary | ICD-10-CM | POA: Diagnosis not present

## 2023-06-14 DIAGNOSIS — M47896 Other spondylosis, lumbar region: Secondary | ICD-10-CM | POA: Diagnosis not present

## 2023-06-14 MED ORDER — ONDANSETRON HCL 4 MG/2ML IJ SOLN: 4.0000 mg | Freq: Once | INTRAMUSCULAR | Status: DC | PRN

## 2023-06-14 MED ORDER — MEPERIDINE HCL 50 MG/ML IJ SOLN: 50.0000 mg | Freq: Once | INTRAMUSCULAR | Status: DC | PRN

## 2023-06-14 MED ORDER — DIAZEPAM 5 MG PO TABS: 5.0000 mg | ORAL_TABLET | Freq: Once | ORAL | Status: DC

## 2023-06-14 MED ORDER — IOPAMIDOL (ISOVUE-M 200) INJECTION 41%
20.0000 mL | Freq: Once | INTRAMUSCULAR | Status: AC
  Administered 2023-06-14: 20 mL via INTRATHECAL

## 2023-06-19 ENCOUNTER — Encounter: Payer: PPO | Attending: Gastroenterology | Admitting: Internal Medicine

## 2023-06-19 ENCOUNTER — Telehealth (INDEPENDENT_AMBULATORY_CARE_PROVIDER_SITE_OTHER): Payer: Self-pay | Admitting: *Deleted

## 2023-06-19 VITALS — BP 149/82 | HR 69 | Temp 97.8°F | Resp 18

## 2023-06-19 DIAGNOSIS — K50018 Crohn's disease of small intestine with other complication: Secondary | ICD-10-CM | POA: Diagnosis not present

## 2023-06-19 DIAGNOSIS — Z5181 Encounter for therapeutic drug level monitoring: Secondary | ICD-10-CM | POA: Insufficient documentation

## 2023-06-19 DIAGNOSIS — I4891 Unspecified atrial fibrillation: Secondary | ICD-10-CM | POA: Insufficient documentation

## 2023-06-19 MED ORDER — VEDOLIZUMAB 300 MG IV SOLR
300.0000 mg | Freq: Once | INTRAVENOUS | Status: AC
  Administered 2023-06-19: 300 mg via INTRAVENOUS
  Filled 2023-06-19: qty 5

## 2023-06-19 NOTE — Telephone Encounter (Signed)
Pt dropped off labs for your to review. Placed on your desk.

## 2023-06-20 ENCOUNTER — Other Ambulatory Visit (INDEPENDENT_AMBULATORY_CARE_PROVIDER_SITE_OTHER): Payer: Self-pay | Admitting: *Deleted

## 2023-06-20 MED ORDER — ENTYVIO 108 MG/0.68ML ~~LOC~~ SOPN: 108.0000 mg | PEN_INJECTOR | SUBCUTANEOUS | 11 refills | Status: DC

## 2023-06-20 NOTE — Telephone Encounter (Signed)
Rx and forms sent to [employer]. Await approval.

## 2023-06-21 DIAGNOSIS — K5090 Crohn's disease, unspecified, without complications: Secondary | ICD-10-CM | POA: Diagnosis not present

## 2023-06-21 DIAGNOSIS — N1831 Chronic kidney disease, stage 3a: Secondary | ICD-10-CM | POA: Diagnosis not present

## 2023-06-21 DIAGNOSIS — I5032 Chronic diastolic (congestive) heart failure: Secondary | ICD-10-CM | POA: Diagnosis not present

## 2023-06-21 DIAGNOSIS — I130 Hypertensive heart and chronic kidney disease with heart failure and stage 1 through stage 4 chronic kidney disease, or unspecified chronic kidney disease: Secondary | ICD-10-CM | POA: Diagnosis not present

## 2023-06-21 NOTE — Telephone Encounter (Signed)
Discussed with patient per [facility] - Reviewed, labs overall look good, plt count now 143 on most recent labs, which is almost back to normal. He should continue to follow with hematology about low platelet count but appears this is improving since being off stelara.  Patient verbalized understanding.

## 2023-06-22 ENCOUNTER — Other Ambulatory Visit (INDEPENDENT_AMBULATORY_CARE_PROVIDER_SITE_OTHER): Payer: Self-pay | Admitting: *Deleted

## 2023-06-22 MED ORDER — ENTYVIO 108 MG/0.68ML ~~LOC~~ SOPN: 108.0000 mg | PEN_INJECTOR | SUBCUTANEOUS | 11 refills | Status: DC

## 2023-06-22 NOTE — Telephone Encounter (Signed)
Pt called and said [facility] and gave [facility] for [facility] [phone]. Will call pharmacy on Monday to see what cost will be for pt

## 2023-06-25 ENCOUNTER — Ambulatory Visit: Payer: PPO | Attending: Cardiology | Admitting: *Deleted

## 2023-06-25 DIAGNOSIS — I4891 Unspecified atrial fibrillation: Secondary | ICD-10-CM | POA: Diagnosis not present

## 2023-06-25 DIAGNOSIS — Z5181 Encounter for therapeutic drug level monitoring: Secondary | ICD-10-CM

## 2023-06-25 LAB — POCT INR: INR: 1.9 — AB (ref 2.0–3.0)

## 2023-06-25 NOTE — Patient Instructions (Signed)
Take warfarin 1 1/2 tablets today then resume 1 tablet daily except 1/2 tablet on Tuesdays Recheck in 6 weeks

## 2023-06-27 ENCOUNTER — Telehealth: Payer: Self-pay | Admitting: Cardiology

## 2023-06-27 DIAGNOSIS — H6990 Unspecified Eustachian tube disorder, unspecified ear: Secondary | ICD-10-CM | POA: Diagnosis not present

## 2023-06-27 DIAGNOSIS — R519 Headache, unspecified: Secondary | ICD-10-CM | POA: Diagnosis not present

## 2023-06-27 DIAGNOSIS — M25551 Pain in right hip: Secondary | ICD-10-CM | POA: Diagnosis not present

## 2023-06-27 DIAGNOSIS — M6281 Muscle weakness (generalized): Secondary | ICD-10-CM | POA: Diagnosis not present

## 2023-06-27 DIAGNOSIS — R2681 Unsteadiness on feet: Secondary | ICD-10-CM | POA: Diagnosis not present

## 2023-06-27 DIAGNOSIS — K5090 Crohn's disease, unspecified, without complications: Secondary | ICD-10-CM | POA: Diagnosis not present

## 2023-06-27 DIAGNOSIS — M545 Low back pain, unspecified: Secondary | ICD-10-CM | POA: Diagnosis not present

## 2023-06-27 DIAGNOSIS — I4891 Unspecified atrial fibrillation: Secondary | ICD-10-CM | POA: Diagnosis not present

## 2023-06-27 NOTE — Telephone Encounter (Signed)
Pt called in stating one of his doctor prescribed him prednisone and he thinks this may affect his coumadin but he wants to be sure. Please advise.

## 2023-06-27 NOTE — Telephone Encounter (Signed)
Dr Neita Carp is starting pt on Prednisone 40mg  x 7 days then 20mg  x 7 days for blocked eustachian tube. Told pt to decrease warfarin to 2.5mg  daily except 1.25mg  on Tuesdays and Fridays and recheck INR on 07/09/23.  Pt verbalized understanding.

## 2023-06-28 DIAGNOSIS — M47896 Other spondylosis, lumbar region: Secondary | ICD-10-CM | POA: Diagnosis not present

## 2023-06-28 DIAGNOSIS — M545 Low back pain, unspecified: Secondary | ICD-10-CM | POA: Diagnosis not present

## 2023-06-28 NOTE — Telephone Encounter (Signed)
Called [facility] pharmacy to check status of prescription and was told pharmacist if doing final review and if more info is needed they will reach out to [place].

## 2023-07-03 ENCOUNTER — Telehealth: Payer: Self-pay | Admitting: *Deleted

## 2023-07-03 NOTE — Telephone Encounter (Signed)
Pt due for injection oct 1st. Will call pharmacy when they open to check status

## 2023-07-03 NOTE — Telephone Encounter (Signed)
Pa [facility]ed in from [facility]. Submitted through cover my meds. Await decision.

## 2023-07-03 NOTE — Telephone Encounter (Signed)
Patient with diagnosis of afib on warfarin for anticoagulation.    Procedure: lumbar medial branch block Date of procedure: 07/26/23  CHA2DS2-VASc Score = 4  This indicates a 4.8% annual risk of stroke. The patient's score is based upon: CHF History: 1 HTN History: 0 Diabetes History: 0 Stroke History: 0 Vascular Disease History: 1 Age Score: 2 Gender Score: 0  CrCl 32mL/min Platelet count 143K  Per office protocol, patient can hold warfarin for 5 days prior to procedure. Patient will not need bridging with Lovenox (enoxaparin) around procedure.  **This guidance is not considered finalized until pre-operative APP has relayed final recommendations.**

## 2023-07-03 NOTE — Telephone Encounter (Signed)
Called [facility] and still waiting for pharmacy to do final accuracy check.

## 2023-07-03 NOTE — Telephone Encounter (Signed)
Pre-operative Risk Assessment    Patient Name: [name]  DOB: June 06, 1931 MRN: [id number]      Request for Surgical Clearance    Procedure:   lumbar medial branch block  Date of Surgery:  Clearance 07/26/23                                 Surgeon:  Dr. Drucie Ip Surgeon's Group or Practice Name:  [facility] Phone number:  [phone] Fax number:  [phone]   Type of Clearance Requested:   - Medical  - Pharmacy:  Hold Warfarin (Coumadin) 5 days prior   Type of Anesthesia:  Not Indicated   Additional requests/questions:   ATTNDarl Pikes - fax:  [phone]  SignedHoover Brunette   07/03/2023, 3:09 PM

## 2023-07-04 ENCOUNTER — Telehealth (INDEPENDENT_AMBULATORY_CARE_PROVIDER_SITE_OTHER): Payer: Self-pay

## 2023-07-04 ENCOUNTER — Telehealth (INDEPENDENT_AMBULATORY_CARE_PROVIDER_SITE_OTHER): Payer: Self-pay | Admitting: *Deleted

## 2023-07-04 NOTE — Telephone Encounter (Signed)
Pt called back and said he just wants to switch to injections and he will bring paperwork for patient assistance tomorrow.

## 2023-07-04 NOTE — Telephone Encounter (Signed)
Patient Name: [name]  DOB: [birth year] MRN: [id number]  Primary Cardiologist: Nona Dell, MD  Chart reviewed as part of pre-operative protocol coverage. Pre-op clearance already addressed by colleagues in earlier phone notes. To summarize recommendations:  -There is no need for specific cardiac clearance other than addressing the issue of anticoagulation.  He is undergoing an injection without any anesthesia.  -Dr. Diona Browner  Per office protocol, patient can hold warfarin for 5 days prior to procedure. Patient will not need bridging with Lovenox (enoxaparin) around procedure. Please resume when medically safe to do so.  Will route this bundled recommendation to requesting provider via Epic fax function and remove from pre-op pool. Please call with questions.  Sharlene Dory, PA-C 07/04/2023, 8:20 AM

## 2023-07-04 NOTE — Telephone Encounter (Signed)
Hey yall, can either of you tell me if you all got patient assistance on this guy?  2:55 PM Amalia Greenhouse, CPhT Johnny Gorter,  patient has not been enrolled for patient assistance.  Charmayne Sheer is reaching to [employer] to let them know patient will need assistance.  Once approved we will let you know.  1  KC thanks  5 mins KD KC Hey do you know if possible that will retro if approved?

## 2023-07-04 NOTE — Telephone Encounter (Signed)
Pt called because [facility] pharmacy left message for him to call and set up shipment for entyvio injections. I spoke to patient who said he prefer to continue with infusion because it would be easier than his wife having to give him injections and he said he already had appt at infusion center for 10/1. I let him know that was done because it was recommended to do it that way so he would not miss a dose if there was any issues with getting injections approved. Injections were approved. Co pay is over $700 per [facility] online portal I checked today. Pt would need to do patient assistance. Please advise if he can continue the infusions or if he should change to injections like you had ordered.

## 2023-07-05 ENCOUNTER — Other Ambulatory Visit (INDEPENDENT_AMBULATORY_CARE_PROVIDER_SITE_OTHER): Payer: Self-pay | Admitting: *Deleted

## 2023-07-05 NOTE — Telephone Encounter (Signed)
Patient came by the office today with his income information and signed his Patient assistance forms for Entyvio. Patient wants to continue on the infusions. I have filled out the patient assistance forms had provider sign and have faxed the forms to [facility] with [facility] at [phone] She will forward to [employer], who is whom does their patient assistance there.

## 2023-07-05 NOTE — Telephone Encounter (Signed)
                  -------  Fax Transmission Report-------  To:               Recipient at [phone] Subject:          RE: [name] Patient assistance forms with income info. Please enter the infusion clinic info. Thanks Result:           The transmission was successful. Explanation:      All Pages Ok Pages Sent:       13 Connect Time:     12 minutes, 0 seconds Transmit Time:    07/05/2023 09:59

## 2023-07-05 NOTE — Telephone Encounter (Signed)
unfortunately, it will not back date.  sorry.  He has an upcoming appt on 10/1.  I'm hoping he will be approved by then.  I will f/u once we have a response from [employer].  KC @[place] ,  just to clarify, [employer] was informed correct????  9 mins Patient is coming by our office today to bring in his income, Who do I need to send that information to?  8 mins KC Amalia Greenhouse, CPhT You can fax it to [facility] and she will forward it to [employer]  7 mins KD Wynelle Link, CPhT Yes [employer] was informed  6 mins KC Amalia Greenhouse, CPhT [place] fax: [phone]  6 mins Is there anything he will need to sign? If so, I can get him to sign it while here  5 mins KC Amalia Greenhouse, CPhT Dantae Meunier what is you fax#???  I will send the PAP forms today for MD to sign.    5 mins [phone]  Thanks  4 mins KC [address], CPhT OK, ty.  Hopefully having all the paperwork will assist [employer] in processing it a little faster.  3 mins ok, thanks  1  Ill be looking for the fax, thanks  1  3 mins KC Amalia Greenhouse, CPhT forms has been sent.  Now KC Thanks sooo much!

## 2023-07-05 NOTE — Telephone Encounter (Signed)
Patient came by the office and brought his income information and wanted to stick with the IV infusions and signed the forms for patient assistance. I have faxed these signed forms, which have been signed by the provider and patient to [facility] at the infusion clinic they will forward to [employer] to send information to pap. I faxed to her at [phone].

## 2023-07-09 ENCOUNTER — Encounter: Payer: PPO | Admitting: *Deleted

## 2023-07-09 DIAGNOSIS — I4891 Unspecified atrial fibrillation: Secondary | ICD-10-CM

## 2023-07-09 DIAGNOSIS — Z5181 Encounter for therapeutic drug level monitoring: Secondary | ICD-10-CM | POA: Diagnosis not present

## 2023-07-09 LAB — POCT INR: INR: 2.6 (ref 2.0–3.0)

## 2023-07-09 NOTE — Patient Instructions (Signed)
Finishes prednisone today. Restart warfarin 1 tablet daily except 1/2 tablet on Tuesdays Pending spinal injection on 07/26/23.  Will hold warfarin 5 days before procedure and resume night of procedure if OK with MD Recheck 1 week after procedure

## 2023-07-10 NOTE — Telephone Encounter (Signed)
Patient wanted to keep getting infusions and has appt at infusion center for 07/17/23.

## 2023-07-11 DIAGNOSIS — M6281 Muscle weakness (generalized): Secondary | ICD-10-CM | POA: Diagnosis not present

## 2023-07-11 DIAGNOSIS — M25551 Pain in right hip: Secondary | ICD-10-CM | POA: Diagnosis not present

## 2023-07-11 DIAGNOSIS — R2681 Unsteadiness on feet: Secondary | ICD-10-CM | POA: Diagnosis not present

## 2023-07-11 DIAGNOSIS — M545 Low back pain, unspecified: Secondary | ICD-10-CM | POA: Diagnosis not present

## 2023-07-12 NOTE — Telephone Encounter (Signed)
I called Entyvio connect spoke with [facility], whom says the patient case is still in progress and no turn around time can be given at this time.

## 2023-07-16 DIAGNOSIS — E559 Vitamin D deficiency, unspecified: Secondary | ICD-10-CM | POA: Diagnosis not present

## 2023-07-16 DIAGNOSIS — E782 Mixed hyperlipidemia: Secondary | ICD-10-CM | POA: Diagnosis not present

## 2023-07-16 DIAGNOSIS — G4700 Insomnia, unspecified: Secondary | ICD-10-CM | POA: Diagnosis not present

## 2023-07-16 DIAGNOSIS — E039 Hypothyroidism, unspecified: Secondary | ICD-10-CM | POA: Diagnosis not present

## 2023-07-16 DIAGNOSIS — I10 Essential (primary) hypertension: Secondary | ICD-10-CM | POA: Diagnosis not present

## 2023-07-16 NOTE — Telephone Encounter (Signed)
Per Entyvio connect [phone] the patient is not eligible for help through the patient assistance foundation as his Medicare advantage is covering the Entyvio at 100% as he has met his out of pocket for the year. Patient will need to re-apply at the beginning of the year as he will be responsible for 20% co insurance at that time when his plan renews. Need to get paperwork together around the last two weeks of December 2024, per [facility] connect.

## 2023-07-17 ENCOUNTER — Encounter: Payer: PPO | Attending: Gastroenterology | Admitting: Emergency Medicine

## 2023-07-17 VITALS — BP 158/82 | HR 69 | Temp 98.1°F | Resp 18

## 2023-07-17 DIAGNOSIS — K50018 Crohn's disease of small intestine with other complication: Secondary | ICD-10-CM | POA: Diagnosis not present

## 2023-07-17 MED ORDER — VEDOLIZUMAB 300 MG IV SOLR
300.0000 mg | Freq: Once | INTRAVENOUS | Status: AC
  Administered 2023-07-17: 300 mg via INTRAVENOUS
  Filled 2023-07-17: qty 5

## 2023-07-17 NOTE — Telephone Encounter (Signed)
I spoke with the patient he is aware his insurance is currently paying at 100 % and we will need to re apply at the end of the year. Patient states understanding. I have placed this in the reminders to resubmit to the Pap at the end of the year.

## 2023-07-17 NOTE — Progress Notes (Signed)
Diagnosis: Crohn's Disease  Provider:  Doylene Bode NP  Procedure: IV Infusion  IV Type: Peripheral, IV Location: L Antecubital  Entyvio (Vedolizumab), Dose: 300 mg  Infusion Start Time: 1334  Infusion Stop Time: 1410  Post Infusion IV Care: Peripheral IV Discontinued  Discharge: Condition: Good, Destination: Home . AVS Provided  Performed by:  Arrie Senate, RN

## 2023-07-18 DIAGNOSIS — M545 Low back pain, unspecified: Secondary | ICD-10-CM | POA: Diagnosis not present

## 2023-07-18 DIAGNOSIS — M25551 Pain in right hip: Secondary | ICD-10-CM | POA: Diagnosis not present

## 2023-07-18 DIAGNOSIS — R2681 Unsteadiness on feet: Secondary | ICD-10-CM | POA: Diagnosis not present

## 2023-07-18 DIAGNOSIS — M6281 Muscle weakness (generalized): Secondary | ICD-10-CM | POA: Diagnosis not present

## 2023-07-19 ENCOUNTER — Ambulatory Visit (INDEPENDENT_AMBULATORY_CARE_PROVIDER_SITE_OTHER): Payer: PPO | Admitting: Gastroenterology

## 2023-07-19 ENCOUNTER — Encounter (INDEPENDENT_AMBULATORY_CARE_PROVIDER_SITE_OTHER): Payer: Self-pay | Admitting: Gastroenterology

## 2023-07-19 VITALS — BP 138/75 | HR 60 | Temp 98.0°F | Ht 68.0 in | Wt 153.6 lb

## 2023-07-19 DIAGNOSIS — K50018 Crohn's disease of small intestine with other complication: Secondary | ICD-10-CM

## 2023-07-19 DIAGNOSIS — K219 Gastro-esophageal reflux disease without esophagitis: Secondary | ICD-10-CM | POA: Diagnosis not present

## 2023-07-19 DIAGNOSIS — K5000 Crohn's disease of small intestine without complications: Secondary | ICD-10-CM

## 2023-07-19 DIAGNOSIS — K2100 Gastro-esophageal reflux disease with esophagitis, without bleeding: Secondary | ICD-10-CM

## 2023-07-19 NOTE — Progress Notes (Addendum)
Referring Provider: Estanislado Pandy, MD Primary Care Physician:  Estanislado Pandy, MD Primary GI Physician: Dr. Levon Hedger   Chief Complaint  Patient presents with   Crohn's Disease    Follow up on Crohn's. Has had three entyvio infusions. Wanted to continue with infusions. States doing well and no concerns.    HPI:   [name] is a 89 y.o. male with past medical history of small bowel Crohn's disease status post resection complicated by recurrent stricture on Humira, C, Stelara induced thrombocytopenia, C difficile colitis, BPH, coronary artery disease, atrial fibrillation, GERD, hypothyroidism   Patient presenting today for follow up of Crohn's disease and GERD.  History: June 2024, at that time doing well on stelara which he started in January. Labs following his visit showed thrombocytopenia which persisted. He was advised to follow with hematology and stop his Stelara as there was concern thrombotypenia was secondary to hyperspeenism from the stelara   Last seen August 12th, he had been recommended to start Entyvio once plt count returned to normal  Plt count on 8/6 was 186k, doing well at last visit. Having some occasional RQL pressure. 1 Bm per day solid to loose in consistency. No BRBPR, darker stools on PO iron. Having more belching, some occasional sore throat  Recommended to start Entyvio 300mg  IV x1 at week 0/2, then [place] q2w at week 6, stop protonix and start omeprazole daily.  Present:  Entyvio started on 06/05/23, 2nd dose 9/3, 3rd dose 10/1   Most recent labs on 8/25 with normal CBC other than plt count of 143k, CMP stable with creatnine 1.43  Today, states he has had 3 infusions of entyvio. He is having a BM maybe every other day, stools are soft but solid. Denies abdominal pain, rectal bleeding, stools remain dark on iron pills. He notes some gas at times as well, takes gas x his PCP recommended which seems to help. He wishes to continue with entyvio via  infusion. He is feeling well.   Appetite is good. No weight loss  He is doing better on omeprazole 40mg , he is doing much better since starting this. Belching has decreased quite a bit.   He has upcoming labs with Dr. Kirtland Bouchard at the end of this month. He was also started on B12 since his last visit here.   TB testing in April was negative, Hepatitis B testing in August was negative   CT A/P with contrast 12/22/22: No acute intra-abdominal or pelvic pathology. 2. No hydronephrosis or nephrolithiasis. 3. Sigmoid diverticulosis. No bowel obstruction. Normal appendix. 4. Partially visualized small bilateral pleural effusions with associated minimal compressive atelectasis of the lower lobes.   Last EGD: 01/02/23 - Discolored mucosa in the esophagus. Biopsied.                           - 2 cm hiatal hernia.                           - Normal stomach.                           - Submucosal nodule - NET found in the duodenum.                            Slightly larger in size. Biopsied.                           -  Buried metallic clip in between layers of second                            portion of small bowel. Last Colonoscopy: 03/23/2016 - Two small polyps at the recto-sigmoid colon and in the cecum, removed with a cold snare. Resected and retrieved. - Mild diverticulosis in the sigmoid colon. - External and internal hemorrhoids   Past Medical History:  Diagnosis Date   Anemia    Atrial fibrillation (HCC)    BPH (benign prostatic hyperplasia)    CAD (coronary artery disease)    Catheterization 2004, mild/moderate nonobstructive disease  /   nuclear, 2007, small inferior scar // no ischemia   Crohn's disease (HCC)    Elevated PSA    GERD (gastroesophageal reflux disease)    History of kidney stones    History of pneumonia    Hypothyroidism    Mitral regurgitation    Pneumonia    SBO (small bowel obstruction) (HCC)    Urinary retention    Wears glasses     Past Surgical History:   Procedure Laterality Date   AGILE CAPSULE  10/18/2011   Procedure: AGILE CAPSULE;  Surgeon: Malissa Hippo, MD;  [facility];  Service: Endoscopy;  Laterality: N/A;  730   BIOPSY  10/05/2022   Procedure: BIOPSY;  Surgeon: Dolores Frame, MD;  [facility];  Service: Gastroenterology;;   BIOPSY  01/02/2023   Procedure: BIOPSY;  Surgeon: Dolores Frame, MD;  [facility];  Service: Gastroenterology;;   CARDIAC CATHETERIZATION  2004   CATARACT EXTRACTION W/PHACO Right 06/27/2021   Procedure: CATARACT EXTRACTION PHACO AND INTRAOCULAR LENS PLACEMENT (IOC);  Surgeon: Fabio Pierce, MD;  Location: [facility];  Service: Ophthalmology;  Laterality: Right;  CDE 21.98   CATARACT EXTRACTION W/PHACO Left 07/11/2021   Procedure: CATARACT EXTRACTION PHACO AND INTRAOCULAR LENS PLACEMENT LEFT EYE;  Surgeon: Fabio Pierce, MD;  Location: [facility];  Service: Ophthalmology;  Laterality: Left;  CDE=10.42   CHOLECYSTECTOMY  2010   Dr. Gabriel Cirri   COLONOSCOPY  2008   [facility]   COLONOSCOPY N/A 03/23/2016   Procedure: COLONOSCOPY;  Surgeon: Malissa Hippo, MD;  [facility];  Service: Endoscopy;  Laterality: N/A;  1:00   CYSTOSCOPY WITH INSERTION OF UROLIFT     ESOPHAGEAL BRUSHING  10/05/2022   Procedure: ESOPHAGEAL BRUSHING;  Surgeon: Dolores Frame, MD;  [facility];  Service: Gastroenterology;;   ESOPHAGOGASTRODUODENOSCOPY  11/08/2011   Procedure: ESOPHAGOGASTRODUODENOSCOPY (EGD);  Surgeon: Malissa Hippo, MD;  [facility];  Service: Endoscopy;  Laterality: N/A;  300   ESOPHAGOGASTRODUODENOSCOPY (EGD) WITH PROPOFOL N/A 10/05/2022   Procedure: ESOPHAGOGASTRODUODENOSCOPY (EGD) WITH PROPOFOL;  Surgeon: Dolores Frame, MD;  [facility];  Service: Gastroenterology;  Laterality: N/A;   ESOPHAGOGASTRODUODENOSCOPY (EGD) WITH PROPOFOL N/A 01/02/2023   Procedure: ESOPHAGOGASTRODUODENOSCOPY (EGD) WITH  PROPOFOL;  Surgeon: Dolores Frame, MD;  [facility];  Service: Gastroenterology;  Laterality: N/A;  1:30 pm, asa 3   HEMOSTASIS CLIP PLACEMENT  10/05/2022   Procedure: HEMOSTASIS CLIP PLACEMENT;  Surgeon: Dolores Frame, MD;  [facility];  Service: Gastroenterology;;   POLYPECTOMY  03/23/2016   Procedure: POLYPECTOMY;  Surgeon: Malissa Hippo, MD;  [facility];  Service: Endoscopy;;  Cecal polyp removed via cold forceps recto-sigmoid polyp removed via cold snare   POLYPECTOMY  10/05/2022  Procedure: POLYPECTOMY;  Surgeon: Dolores Frame, MD;  [facility];  Service: Gastroenterology;;   TRANSURETHRAL RESECTION OF PROSTATE N/A 04/29/2020   Procedure: TRANSURETHRAL RESECTION OF THE PROSTATE (TURP);  Surgeon: Marcine Matar, MD;  Location: [facility];  Service: Urology;  Laterality: N/A;  90 MINS    Current Outpatient Medications  Medication Sig Dispense Refill   acetaminophen (TYLENOL) 500 MG tablet Take 500 mg by mouth every 6 (six) hours as needed for mild pain or moderate pain.     CALCIUM PO Take 300 mg by mouth in the morning and at bedtime.     carvedilol (COREG) 12.5 MG tablet Take 1 tablet (12.5 mg total) by mouth 2 (two) times daily. 60 tablet 0   Cholecalciferol (VITAMIN D3) 50 MCG (2000 UT) TABS Take 2,000 Units by mouth daily.     cyanocobalamin (VITAMIN B12) 1000 MCG tablet Take 1,000 mcg by mouth daily.     diltiazem (CARDIZEM CD) 180 MG 24 hr capsule Take 180 mg by mouth 2 (two) times daily.     ferrous sulfate (FEROSUL) 325 (65 FE) MG tablet Take 325 mg by mouth daily with breakfast.     fluticasone (FLONASE) 50 MCG/ACT nasal spray INSTILL 2 SPRAYS EVERY DAY     furosemide (LASIX) 40 MG tablet Take 1 tablet (40 mg total) by mouth daily as needed (leg swelling). 03/20/2023 NEW 30 tablet 3   levothyroxine (SYNTHROID, LEVOTHROID) 75 MCG tablet Take 75 mcg by mouth daily before  breakfast.     magnesium oxide (MAG-OX) 400 (240 Mg) MG tablet Take 400 mg by mouth 2 (two) times daily.     Melatonin 10 MG TABS Take 10 mg by mouth at bedtime.     nitroGLYCERIN (NITROSTAT) 0.4 MG SL tablet Place 0.4 mg under the tongue every 5 (five) minutes as needed. For chest pains. May repeat for up to 3 doses.     omeprazole (PRILOSEC) 40 MG capsule Take 1 capsule (40 mg total) by mouth daily. 90 capsule 1   Probiotic Product (ALIGN) 4 MG CAPS Take 4 mg by mouth in the morning.     traZODone (DESYREL) 50 MG tablet Take 50 mg by mouth at bedtime.     warfarin (COUMADIN) 2.5 MG tablet TAKE 1 TABLET DAILY EXCEPT 1/2 TABLET ON TUESDAYS OR AS DIRECTED 30 tablet 5   No current facility-administered medications for this visit.    Allergies as of 07/19/2023   (No Known Allergies)    Family History  Problem Relation Age of Onset   Colon cancer Mother    Heart disease Father    Stroke Brother    Healthy Daughter     Social History   Socioeconomic History   Marital status: Married    Spouse name: Not on file   Number of children: Not on file   Years of education: Not on file   Highest education level: Not on file  Occupational History   Occupation: Public house manager: RETIRED  Tobacco Use   Smoking status: Never    Passive exposure: Never   Smokeless tobacco: Never  Vaping Use   Vaping status: Never Used  Substance and Sexual Activity   Alcohol use: No    Alcohol/week: 0.0 standard drinks of alcohol   Drug use: No   Sexual activity: Not on file  Other Topics Concern   Not on file  Social History Narrative   Married   Social Determinants of  Health   Financial Resource Strain: Not on file  Food Insecurity: No Food Insecurity (10/02/2022)   Hunger Vital Sign    Worried About Running Out of Food in the Last Year: Never true    Ran Out of Food in the Last Year: Never true  Transportation Needs: No Transportation Needs (10/02/2022)   PRAPARE -  Administrator, Civil Service (Medical): No    Lack of Transportation (Non-Medical): No  Physical Activity: Not on file  Stress: Not on file  Social Connections: Not on file    Review of systems General: negative for malaise, night sweats, fever, chills, weight loss Neck: Negative for lumps, goiter, pain and significant neck swelling Resp: Negative for cough, wheezing, dyspnea at rest CV: Negative for chest pain, leg swelling, palpitations, orthopnea GI: denies melena, hematochezia, nausea, vomiting, diarrhea, constipation, dysphagia, odyonophagia, early satiety or unintentional weight loss.  MSK: Negative for joint pain or swelling, back pain, and muscle pain. Derm: Negative for itching or rash Psych: Denies depression, anxiety, memory loss, confusion. No homicidal or suicidal ideation.  Heme: Negative for prolonged bleeding, bruising easily, and swollen nodes. Endocrine: Negative for cold or heat intolerance, polyuria, polydipsia and goiter. Neuro: negative for tremor, gait imbalance, syncope and seizures. The remainder of the review of systems is noncontributory.  Physical Exam: There were no vitals taken for this visit. General:   Alert and oriented. No distress noted. Pleasant and cooperative.  Head:  Normocephalic and atraumatic. Eyes:  Conjuctiva clear without scleral icterus. Mouth:  Oral mucosa pink and moist. Good dentition. No lesions. Heart: Normal rate and rhythm, s1 and s2 heart sounds present.  Lungs: Clear lung sounds in all lobes. Respirations equal and unlabored. Abdomen:  +BS, soft, non-tender and non-distended. No rebound or guarding. No HSM or masses noted. Derm: No palmar erythema or jaundice Msk:  Symmetrical without gross deformities. Normal posture. Extremities:  Without edema. Neurologic:  Alert and  oriented x4 Psych:  Alert and cooperative. Normal mood and affect.  Invalid input(s): "6 MONTHS"   ASSESSMENT: [name] is a 89 y.o. male  presenting today for follow up of Crohn's disease, now on Entyvio since August  Crohn's disease: previously on stelara that he started in January 2024, developed hrombocytopenia that persisted over further repeat lab testing. stelara was stopped and he was referred to Hematology. As platelet count returned to normal, he was started on Entyvio on 8/20, he has had  3 induction infusions, next maintenance infusion in november. He is feeling well. Having 1 soft but solid BM every other day without abdominal pain, rectal bleeding or melena.  He had recent CMP and CBC, though notably, slight drop in plt count to 143k in August. He has upcoming labs with Hematology at the end of this month to follow up on thrombocytopenia. Will continue with entyvio at this time. He prefers to continue with infusion vs.  injections. Will update CRP and Fecal calprotectin.   GERD: well controlled on omeprazole 40mg  daily. Denies any GERD symptoms. Will continue with current regimen   PLAN:  Continue Entyvio Infusion q8w 2. CRP, fecal calprotectin  3. Continue to follow with hematology 4. Continue omeprazole 40mg  daily   All questions were answered, patient verbalized understanding and is in agreement with plan as outlined above.    Follow Up: 2 months   Brittinee Risk L. Jeanmarie Hubert, MSN, APRN, AGNP-C Adult-Gerontology Nurse Practitioner [facility]  I have reviewed the note and agree with the  APP's assessment as described in this progress note  Katrinka Blazing, MD Gastroenterology and Hepatology [facility]

## 2023-07-19 NOTE — Patient Instructions (Signed)
We will continue entyvio infusions Continue omeprazole 40mg  daily We will check inflammatory markers in blood/stool  Follow up 2 months  It was a pleasure to see you today. I want to create trusting relationships with patients and provide genuine, compassionate, and quality care. I truly value your feedback! please be on the lookout for a survey regarding your visit with me today. I appreciate your input about our visit and your time in completing this!    Quashon Jesus L. Jeanmarie Hubert, MSN, APRN, AGNP-C Adult-Gerontology Nurse Practitioner [facility]

## 2023-07-23 DIAGNOSIS — K50018 Crohn's disease of small intestine with other complication: Secondary | ICD-10-CM | POA: Diagnosis not present

## 2023-07-24 ENCOUNTER — Other Ambulatory Visit (INDEPENDENT_AMBULATORY_CARE_PROVIDER_SITE_OTHER): Payer: Self-pay | Admitting: Gastroenterology

## 2023-07-24 DIAGNOSIS — K50018 Crohn's disease of small intestine with other complication: Secondary | ICD-10-CM | POA: Diagnosis not present

## 2023-07-24 LAB — C-REACTIVE PROTEIN: CRP: 2 mg/L (ref 0–10)

## 2023-07-25 DIAGNOSIS — R2681 Unsteadiness on feet: Secondary | ICD-10-CM | POA: Diagnosis not present

## 2023-07-25 DIAGNOSIS — M6281 Muscle weakness (generalized): Secondary | ICD-10-CM | POA: Diagnosis not present

## 2023-07-25 DIAGNOSIS — M545 Low back pain, unspecified: Secondary | ICD-10-CM | POA: Diagnosis not present

## 2023-07-25 DIAGNOSIS — M25551 Pain in right hip: Secondary | ICD-10-CM | POA: Diagnosis not present

## 2023-07-26 DIAGNOSIS — M47816 Spondylosis without myelopathy or radiculopathy, lumbar region: Secondary | ICD-10-CM | POA: Diagnosis not present

## 2023-07-27 LAB — CALPROTECTIN, FECAL: Calprotectin, Fecal: 205 ug/g — ABNORMAL HIGH (ref 0–120)

## 2023-08-01 DIAGNOSIS — M6281 Muscle weakness (generalized): Secondary | ICD-10-CM | POA: Diagnosis not present

## 2023-08-01 DIAGNOSIS — R2681 Unsteadiness on feet: Secondary | ICD-10-CM | POA: Diagnosis not present

## 2023-08-01 DIAGNOSIS — M545 Low back pain, unspecified: Secondary | ICD-10-CM | POA: Diagnosis not present

## 2023-08-01 DIAGNOSIS — M25551 Pain in right hip: Secondary | ICD-10-CM | POA: Diagnosis not present

## 2023-08-02 DIAGNOSIS — Z23 Encounter for immunization: Secondary | ICD-10-CM | POA: Diagnosis not present

## 2023-08-06 ENCOUNTER — Ambulatory Visit: Payer: PPO | Attending: Cardiology | Admitting: *Deleted

## 2023-08-06 DIAGNOSIS — R030 Elevated blood-pressure reading, without diagnosis of hypertension: Secondary | ICD-10-CM | POA: Diagnosis not present

## 2023-08-06 DIAGNOSIS — Z5181 Encounter for therapeutic drug level monitoring: Secondary | ICD-10-CM

## 2023-08-06 DIAGNOSIS — M545 Low back pain, unspecified: Secondary | ICD-10-CM | POA: Diagnosis not present

## 2023-08-06 DIAGNOSIS — Z6823 Body mass index (BMI) 23.0-23.9, adult: Secondary | ICD-10-CM | POA: Diagnosis not present

## 2023-08-06 DIAGNOSIS — M546 Pain in thoracic spine: Secondary | ICD-10-CM | POA: Diagnosis not present

## 2023-08-06 DIAGNOSIS — I4891 Unspecified atrial fibrillation: Secondary | ICD-10-CM

## 2023-08-06 LAB — POCT INR: INR: 1.9 — AB (ref 2.0–3.0)

## 2023-08-06 NOTE — Patient Instructions (Signed)
Take warfarin 1 1/2 tablets tonight then resume 1 tablet daily except 1/2 tablet on Tuesdays Recheck 3 weeks

## 2023-08-08 ENCOUNTER — Telehealth: Payer: Self-pay

## 2023-08-08 NOTE — Telephone Encounter (Signed)
Patient called today states he has had some issues with headache since June 27, 2023. He is also having left should and back pain. Says he has had two steroid shots for his arthritis and pcp gave the patient prednisone to help with the headaches.  I advised that they should go to the hospital to be evaluated, they says they do not think cardiac related, I gave them an appointment for tomorrow at 11:30 with Dr. Levon Hedger and made sure they understand if worsening symptoms to go to the ED. Patient states understanding.

## 2023-08-09 ENCOUNTER — Encounter (INDEPENDENT_AMBULATORY_CARE_PROVIDER_SITE_OTHER): Payer: Self-pay

## 2023-08-09 ENCOUNTER — Encounter (INDEPENDENT_AMBULATORY_CARE_PROVIDER_SITE_OTHER): Payer: Self-pay | Admitting: Gastroenterology

## 2023-08-09 ENCOUNTER — Ambulatory Visit (INDEPENDENT_AMBULATORY_CARE_PROVIDER_SITE_OTHER): Payer: PPO | Admitting: Gastroenterology

## 2023-08-09 VITALS — BP 139/85 | HR 59 | Temp 97.5°F | Ht 68.0 in | Wt 151.3 lb

## 2023-08-09 DIAGNOSIS — K50018 Crohn's disease of small intestine with other complication: Secondary | ICD-10-CM | POA: Diagnosis not present

## 2023-08-09 NOTE — Progress Notes (Signed)
Katrinka Blazing, M.D. Gastroenterology & Hepatology [facility] Gastroenterology [address] [place], [place] [address]  Primary Care Physician: Estanislado Pandy, MD [address] [place] [address]  I will communicate my assessment and recommendations to the referring MD via EMR.  Problems: Crohn's disease status post resection complicated by recurrent stricture  History of Present Illness: [name] is a 89 y.o. male male with past medical history of small bowel Crohn's disease status post resection complicated by recurrent stricture , C, Stelara induced thrombocytopenia, C difficile colitis, BPH, coronary artery disease, atrial fibrillation, GERD, hypothyroidism , who presents for follow up of Crohn's disease.  The patient was last seen on 07/19/2023. At that time, the patient was endorsing symptom improvement.  He was advised to continue Entyvio every 8 weeks and was ordered to have CRP and fecal calprotectin checked.  Patient reports that over the weekend he presented new onset of pain in his L shoulder. He did heating pads and topical medications. He went to the ER for evaluation and was told that he may have had pain in his shoulder as a side effect of some steroids shots given by orthopedics doctor.  The patient became concerned as he read that the issues with anterior may have headache and joint pain.  Patient reports that he has presented intermittent headache  since June 27 2023. Has used Tylenol intermittently.  Was given prednisone briefly by his PCP but he did not continue this medication as he did not feel any symptom improvement with it.  Has Entyvio infusion coming in November 2024.  Patient has been following with hematology for thrombocytopenia, which was thought to be related to the use of Stelara.  It has progressively improved upon discontinuation of the medication.  Last EGD: 01/02/23 - Discolored mucosa in the esophagus.  Biopsied.                           - 2 cm hiatal hernia.                           - Normal stomach.                           - Submucosal nodule - NET found in the duodenum.                            Slightly larger in size. Biopsied.                           - Buried metallic clip in between layers of second                            portion of small bowel. Last Colonoscopy: 03/23/2016 - Two small polyps at the recto-sigmoid colon and in the cecum, removed with a cold snare. Resected and retrieved. - Mild diverticulosis in the sigmoid colon. - External and internal hemorrhoids  Past Medical History: Past Medical History:  Diagnosis Date   Anemia    Atrial fibrillation (HCC)    BPH (benign prostatic hyperplasia)    CAD (coronary artery disease)    Catheterization 2004, mild/moderate nonobstructive disease  /   nuclear, 2007, small inferior scar //  no ischemia   Crohn's disease (HCC)    Elevated PSA    GERD (gastroesophageal reflux disease)    History of kidney stones    History of pneumonia    Hypothyroidism    Mitral regurgitation    Pneumonia    SBO (small bowel obstruction) (HCC)    Urinary retention    Wears glasses     Past Surgical History: Past Surgical History:  Procedure Laterality Date   AGILE CAPSULE  10/18/2011   Procedure: AGILE CAPSULE;  Surgeon: Malissa Hippo, MD;  [facility];  Service: Endoscopy;  Laterality: N/A;  730   BIOPSY  10/05/2022   Procedure: BIOPSY;  Surgeon: Dolores Frame, MD;  [facility];  Service: Gastroenterology;;   BIOPSY  01/02/2023   Procedure: BIOPSY;  Surgeon: Dolores Frame, MD;  [facility];  Service: Gastroenterology;;   CARDIAC CATHETERIZATION  2004   CATARACT EXTRACTION W/PHACO Right 06/27/2021   Procedure: CATARACT EXTRACTION PHACO AND INTRAOCULAR LENS PLACEMENT (IOC);  Surgeon: Fabio Pierce, MD;  Location: [facility];  Service: Ophthalmology;  Laterality: Right;  CDE  21.98   CATARACT EXTRACTION W/PHACO Left 07/11/2021   Procedure: CATARACT EXTRACTION PHACO AND INTRAOCULAR LENS PLACEMENT LEFT EYE;  Surgeon: Fabio Pierce, MD;  Location: [facility];  Service: Ophthalmology;  Laterality: Left;  CDE=10.42   CHOLECYSTECTOMY  2010   Dr. Gabriel Cirri   COLONOSCOPY  2008   [facility]   COLONOSCOPY N/A 03/23/2016   Procedure: COLONOSCOPY;  Surgeon: Malissa Hippo, MD;  [facility];  Service: Endoscopy;  Laterality: N/A;  1:00   CYSTOSCOPY WITH INSERTION OF UROLIFT     ESOPHAGEAL BRUSHING  10/05/2022   Procedure: ESOPHAGEAL BRUSHING;  Surgeon: Dolores Frame, MD;  [facility];  Service: Gastroenterology;;   ESOPHAGOGASTRODUODENOSCOPY  11/08/2011   Procedure: ESOPHAGOGASTRODUODENOSCOPY (EGD);  Surgeon: Malissa Hippo, MD;  [facility];  Service: Endoscopy;  Laterality: N/A;  300   ESOPHAGOGASTRODUODENOSCOPY (EGD) WITH PROPOFOL N/A 10/05/2022   Procedure: ESOPHAGOGASTRODUODENOSCOPY (EGD) WITH PROPOFOL;  Surgeon: Dolores Frame, MD;  [facility];  Service: Gastroenterology;  Laterality: N/A;   ESOPHAGOGASTRODUODENOSCOPY (EGD) WITH PROPOFOL N/A 01/02/2023   Procedure: ESOPHAGOGASTRODUODENOSCOPY (EGD) WITH PROPOFOL;  Surgeon: Dolores Frame, MD;  [facility];  Service: Gastroenterology;  Laterality: N/A;  1:30 pm, asa 3   HEMOSTASIS CLIP PLACEMENT  10/05/2022   Procedure: HEMOSTASIS CLIP PLACEMENT;  Surgeon: Dolores Frame, MD;  [facility];  Service: Gastroenterology;;   POLYPECTOMY  03/23/2016   Procedure: POLYPECTOMY;  Surgeon: Malissa Hippo, MD;  [facility];  Service: Endoscopy;;  Cecal polyp removed via cold forceps recto-sigmoid polyp removed via cold snare   POLYPECTOMY  10/05/2022   Procedure: POLYPECTOMY;  Surgeon: Dolores Frame, MD;  [facility];  Service: Gastroenterology;;   TRANSURETHRAL RESECTION OF PROSTATE N/A  04/29/2020   Procedure: TRANSURETHRAL RESECTION OF THE PROSTATE (TURP);  Surgeon: Marcine Matar, MD;  Location: [facility];  Service: Urology;  Laterality: N/A;  90 MINS    Family History: Family History  Problem Relation Age of Onset   Colon cancer Mother    Heart disease Father    Stroke Brother    Healthy Daughter     Social History: Social History   Tobacco Use  Smoking Status Never   Passive exposure: Never  Smokeless Tobacco Never   Social History   Substance and  Sexual Activity  Alcohol Use No   Alcohol/week: 0.0 standard drinks of alcohol   Social History   Substance and Sexual Activity  Drug Use No    Allergies: No Known Allergies  Medications: Current Outpatient Medications  Medication Sig Dispense Refill   acetaminophen (TYLENOL) 500 MG tablet Take 500 mg by mouth every 6 (six) hours as needed for mild pain or moderate pain.     CALCIUM PO Take 300 mg by mouth in the morning and at bedtime.     carvedilol (COREG) 12.5 MG tablet Take 1 tablet (12.5 mg total) by mouth 2 (two) times daily. 60 tablet 0   Cholecalciferol (VITAMIN D3) 50 MCG (2000 UT) TABS Take 2,000 Units by mouth daily.     cyanocobalamin (VITAMIN B12) 1000 MCG tablet Take 1,000 mcg by mouth daily.     diltiazem (CARDIZEM CD) 180 MG 24 hr capsule Take 180 mg by mouth 2 (two) times daily.     ferrous sulfate (FEROSUL) 325 (65 FE) MG tablet Take 325 mg by mouth daily with breakfast.     fluticasone (FLONASE) 50 MCG/ACT nasal spray Place 1 spray into both nostrils daily.     levothyroxine (SYNTHROID, LEVOTHROID) 75 MCG tablet Take 75 mcg by mouth daily before breakfast.     magnesium oxide (MAG-OX) 400 (240 Mg) MG tablet Take 400 mg by mouth 2 (two) times daily.     Melatonin 10 MG TABS Take 10 mg by mouth at bedtime.     nitroGLYCERIN (NITROSTAT) 0.4 MG SL tablet Place 0.4 mg under the tongue every 5 (five) minutes as needed. For chest pains. May repeat for up to 3 doses.      omeprazole (PRILOSEC) 40 MG capsule Take 1 capsule (40 mg total) by mouth daily. 90 capsule 1   Probiotic Product (ALIGN) 4 MG CAPS Take 4 mg by mouth in the morning.     traZODone (DESYREL) 50 MG tablet Take 50 mg by mouth at bedtime.     Vedolizumab (ENTYVIO IV) Inject into the vein. Every 8 weeks.     warfarin (COUMADIN) 2.5 MG tablet TAKE 1 TABLET DAILY EXCEPT 1/2 TABLET ON TUESDAYS OR AS DIRECTED 30 tablet 5   furosemide (LASIX) 40 MG tablet Take 1 tablet (40 mg total) by mouth daily as needed (leg swelling). 03/20/2023 NEW (Patient not taking: Reported on 08/09/2023) 30 tablet 3   No current facility-administered medications for this visit.    Review of Systems: GENERAL: negative for malaise, night sweats HEENT: No changes in hearing or vision, no nose bleeds or other nasal problems. NECK: Negative for lumps, goiter, pain and significant neck swelling RESPIRATORY: Negative for cough, wheezing CARDIOVASCULAR: Negative for chest pain, leg swelling, palpitations, orthopnea GI: SEE HPI MUSCULOSKELETAL: Negative for joint pain or swelling, back pain, and muscle pain. SKIN: Negative for lesions, rash PSYCH: Negative for sleep disturbance, mood disorder and recent psychosocial stressors. HEMATOLOGY Negative for prolonged bleeding, bruising easily, and swollen nodes. ENDOCRINE: Negative for cold or heat intolerance, polyuria, polydipsia and goiter. NEURO: negative for tremor, gait imbalance, syncope and seizures. The remainder of the review of systems is noncontributory.   Physical Exam: BP 139/85 (BP Location: Left Arm, Patient Position: Sitting, Cuff Size: Normal)   Pulse (!) 59   Temp (!) 97.5 F (36.4 C) (Temporal)   Ht 5\' 8"  (1.727 m)   Wt 151 lb 4.8 oz (68.6 kg)   BMI 23.01 kg/m  GENERAL: The patient is AO x3, in no acute distress.  HEENT: Head is normocephalic and atraumatic. EOMI are intact. Mouth is well hydrated and without lesions. NECK: Supple. No masses LUNGS: Clear  to auscultation. No presence of rhonchi/wheezing/rales. Adequate chest expansion HEART: RRR, normal s1 and s2. ABDOMEN: mildly tender to palpation in RLQ, no guarding, no peritoneal signs, and nondistended. BS +. No masses. EXTREMITIES: Without any cyanosis, clubbing, rash, lesions or edema. NEUROLOGIC: AOx3, no focal motor deficit. SKIN: no jaundice, no rashes  Imaging/Labs: as above  I personally reviewed and interpreted the available labs, imaging and endoscopic files.  Impression and Plan: [name] is a 89 y.o. male male with past medical history of small bowel Crohn's disease status post resection complicated by recurrent stricture , C, Stelara induced thrombocytopenia, C difficile colitis, BPH, coronary artery disease, atrial fibrillation, GERD, hypothyroidism , who presents for follow up of Crohn's disease.  The patient has presented improvement of his gastrointestinal symptoms with Entyvio every 8 weeks.  However, he has been concerned about potential side effects of this medication as he has presented some headache intermittently and joint pain.  Notably, the symptoms have improved and have not been associated to the time of his infusions.  We discussed in detail the possibility of Entyvio causing some of his symptoms and the fact that the only way to determine if it is related to this is by discontinuing the medication.  For now, both the patient and the wife would like to continue the medicine and they would like to establish if there is any relation between the administration of the medication and the recurrence of symptoms.  -Continue Entyvio every 8 weeks -Patient should let us know if he develops recurrent headache or shoulder pain with next dose of Entyvio  All questions were answered.      Katrinka Blazing, MD Gastroenterology and Hepatology [facility] Gastroenterology

## 2023-08-09 NOTE — Patient Instructions (Signed)
Continue Entyvio every 8 weeks Please let us know if you develop recurrent headache or shoulder pain with next dose of Entyvio

## 2023-08-13 ENCOUNTER — Inpatient Hospital Stay: Payer: PPO | Attending: Hematology

## 2023-08-13 DIAGNOSIS — D696 Thrombocytopenia, unspecified: Secondary | ICD-10-CM | POA: Insufficient documentation

## 2023-08-13 DIAGNOSIS — E538 Deficiency of other specified B group vitamins: Secondary | ICD-10-CM

## 2023-08-13 LAB — CBC WITH DIFFERENTIAL/PLATELET
Abs Immature Granulocytes: 0.02 10*3/uL (ref 0.00–0.07)
Basophils Absolute: 0.1 10*3/uL (ref 0.0–0.1)
Basophils Relative: 1 %
Eosinophils Absolute: 0.1 10*3/uL (ref 0.0–0.5)
Eosinophils Relative: 1 %
HCT: 41 % (ref 39.0–52.0)
Hemoglobin: 13.9 g/dL (ref 13.0–17.0)
Immature Granulocytes: 0 %
Lymphocytes Relative: 22 %
Lymphs Abs: 1.7 10*3/uL (ref 0.7–4.0)
MCH: 31.2 pg (ref 26.0–34.0)
MCHC: 33.9 g/dL (ref 30.0–36.0)
MCV: 92.1 fL (ref 80.0–100.0)
Monocytes Absolute: 0.7 10*3/uL (ref 0.1–1.0)
Monocytes Relative: 8 %
Neutro Abs: 5.5 10*3/uL (ref 1.7–7.7)
Neutrophils Relative %: 68 %
Platelets: 162 10*3/uL (ref 150–400)
RBC: 4.45 MIL/uL (ref 4.22–5.81)
RDW: 13.6 % (ref 11.5–15.5)
WBC: 8.1 10*3/uL (ref 4.0–10.5)
nRBC: 0 % (ref 0.0–0.2)

## 2023-08-13 LAB — VITAMIN B12: Vitamin B-12: 473 pg/mL (ref 180–914)

## 2023-08-15 DIAGNOSIS — M545 Low back pain, unspecified: Secondary | ICD-10-CM | POA: Diagnosis not present

## 2023-08-15 DIAGNOSIS — M6281 Muscle weakness (generalized): Secondary | ICD-10-CM | POA: Diagnosis not present

## 2023-08-15 DIAGNOSIS — M25551 Pain in right hip: Secondary | ICD-10-CM | POA: Diagnosis not present

## 2023-08-15 DIAGNOSIS — R2681 Unsteadiness on feet: Secondary | ICD-10-CM | POA: Diagnosis not present

## 2023-08-16 ENCOUNTER — Other Ambulatory Visit: Payer: Self-pay

## 2023-08-16 ENCOUNTER — Encounter (HOSPITAL_COMMUNITY): Payer: Self-pay

## 2023-08-16 ENCOUNTER — Emergency Department (HOSPITAL_COMMUNITY)
Admission: EM | Admit: 2023-08-16 | Discharge: 2023-08-16 | Disposition: A | Discharge status: Home / Self Care | Payer: PPO | Attending: Emergency Medicine | Admitting: Emergency Medicine

## 2023-08-16 ENCOUNTER — Emergency Department (HOSPITAL_COMMUNITY): Payer: PPO

## 2023-08-16 DIAGNOSIS — R9431 Abnormal electrocardiogram [ECG] [EKG]: Secondary | ICD-10-CM | POA: Diagnosis not present

## 2023-08-16 DIAGNOSIS — M25512 Pain in left shoulder: Secondary | ICD-10-CM | POA: Insufficient documentation

## 2023-08-16 DIAGNOSIS — R0602 Shortness of breath: Secondary | ICD-10-CM | POA: Diagnosis not present

## 2023-08-16 DIAGNOSIS — R109 Unspecified abdominal pain: Secondary | ICD-10-CM | POA: Diagnosis not present

## 2023-08-16 LAB — CBC WITH DIFFERENTIAL/PLATELET
Abs Immature Granulocytes: 0.02 10*3/uL (ref 0.00–0.07)
Basophils Absolute: 0.1 10*3/uL (ref 0.0–0.1)
Basophils Relative: 1 %
Eosinophils Absolute: 0.1 10*3/uL (ref 0.0–0.5)
Eosinophils Relative: 1 %
HCT: 42.5 % (ref 39.0–52.0)
Hemoglobin: 14.5 g/dL (ref 13.0–17.0)
Immature Granulocytes: 0 %
Lymphocytes Relative: 22 %
Lymphs Abs: 1.7 10*3/uL (ref 0.7–4.0)
MCH: 31 pg (ref 26.0–34.0)
MCHC: 34.1 g/dL (ref 30.0–36.0)
MCV: 90.8 fL (ref 80.0–100.0)
Monocytes Absolute: 0.7 10*3/uL (ref 0.1–1.0)
Monocytes Relative: 9 %
Neutro Abs: 5.3 10*3/uL (ref 1.7–7.7)
Neutrophils Relative %: 67 %
Platelets: 144 10*3/uL — ABNORMAL LOW (ref 150–400)
RBC: 4.68 MIL/uL (ref 4.22–5.81)
RDW: 13.7 % (ref 11.5–15.5)
WBC: 7.8 10*3/uL (ref 4.0–10.5)
nRBC: 0 % (ref 0.0–0.2)

## 2023-08-16 LAB — BASIC METABOLIC PANEL
Anion gap: 11 (ref 5–15)
BUN: 16 mg/dL (ref 8–23)
CO2: 23 mmol/L (ref 22–32)
Calcium: 9.3 mg/dL (ref 8.9–10.3)
Chloride: 105 mmol/L (ref 98–111)
Creatinine, Ser: 1.36 mg/dL — ABNORMAL HIGH (ref 0.61–1.24)
GFR, Estimated: 49 mL/min — ABNORMAL LOW (ref >60)
Glucose, Bld: 107 mg/dL — ABNORMAL HIGH (ref 70–99)
Potassium: 3.8 mmol/L (ref 3.5–5.1)
Sodium: 139 mmol/L (ref 135–145)

## 2023-08-16 LAB — PROTIME-INR
INR: 1.9 — ABNORMAL HIGH (ref 0.8–1.2)
Prothrombin Time: 21.6 s — ABNORMAL HIGH (ref 11.4–15.2)

## 2023-08-16 LAB — TROPONIN I (HIGH SENSITIVITY)
Troponin I (High Sensitivity): 4 ng/L (ref <18)
Troponin I (High Sensitivity): 4 ng/L (ref <18)

## 2023-08-16 NOTE — ED Triage Notes (Signed)
Pt was brought in by wife, for reported left shoulder and back pain that started roughly 0815 this morning. Pain rated 4/10. Pt reported taking one extra strength tylenol this morning.

## 2023-08-16 NOTE — ED Provider Notes (Signed)
[facility] EMERGENCY DEPARTMENT AT [facility] Provider Note   CSN: [id number] Arrival date & time: 08/16/23  1013     History Chief Complaint  Patient presents with   Shoulder Pain   Flank Pain    [name] is a 89 y.o. male atrial fibrillation on Coumadin, coronary disease, Crohn's disease who presents to the emergency department today for further evaluation of left posterior shoulder pain.  Pain started this morning when he woke up.  Wife is at bedside provides some the history.  She states that the patient was at PT yesterday and was pushing a heavy object.  Patient's left shoulder pain did resolve while he was waiting in the emergency room lobby but want to make sure it was not coming from his heart given his history.  He currently denies chest pain, shortness of breath, dizziness, nausea, vomiting, diarrhea, diaphoresis.   Shoulder Pain Flank Pain       Home Medications Prior to Admission medications   Medication Sig Start Date End Date Taking? Authorizing Provider  acetaminophen (TYLENOL) 500 MG tablet Take 500 mg by mouth every 6 (six) hours as needed for mild pain or moderate pain.    [provider]  CALCIUM PO Take 300 mg by mouth in the morning and at bedtime.    [provider]  carvedilol (COREG) 12.5 MG tablet Take 1 tablet (12.5 mg total) by mouth 2 (two) times daily. 10/10/22 03/18/89  Sherryll Burger, Pratik D, DO  Cholecalciferol (VITAMIN D3) 50 MCG (2000 UT) TABS Take 2,000 Units by mouth daily.    [provider]  cyanocobalamin (VITAMIN B12) 1000 MCG tablet Take 1,000 mcg by mouth daily.    [provider]  diltiazem (CARDIZEM CD) 180 MG 24 hr capsule Take 180 mg by mouth 2 (two) times daily.    [provider]  ferrous sulfate (FEROSUL) 325 (65 FE) MG tablet Take 325 mg by mouth daily with breakfast.    [provider]  fluticasone (FLONASE) 50 MCG/ACT nasal spray Place 1 spray into both nostrils daily.  05/19/23   [provider]  furosemide (LASIX) 40 MG tablet Take 1 tablet (40 mg total) by mouth daily as needed (leg swelling). 03/20/2023 NEW Patient not taking: Reported on 08/09/2023 03/20/23   Jonelle Sidle, MD  levothyroxine (SYNTHROID, LEVOTHROID) 75 MCG tablet Take 75 mcg by mouth daily before breakfast.    [provider]  magnesium oxide (MAG-OX) 400 (240 Mg) MG tablet Take 400 mg by mouth 2 (two) times daily.    [provider]  Melatonin 10 MG TABS Take 10 mg by mouth at bedtime.    [provider]  nitroGLYCERIN (NITROSTAT) 0.4 MG SL tablet Place 0.4 mg under the tongue every 5 (five) minutes as needed. For chest pains. May repeat for up to 3 doses.    [provider]  omeprazole (PRILOSEC) 40 MG capsule Take 1 capsule (40 mg total) by mouth daily. 05/28/23   Carlan, Jeral Pinch, NP  Probiotic Product (ALIGN) 4 MG CAPS Take 4 mg by mouth in the morning.    [provider]  traZODone (DESYREL) 50 MG tablet Take 50 mg by mouth at bedtime.    [provider]  Vedolizumab (ENTYVIO IV) Inject into the vein. Every 8 weeks.    [provider]  warfarin (COUMADIN) 2.5 MG tablet TAKE 1 TABLET DAILY EXCEPT 1/2 TABLET ON TUESDAYS OR AS DIRECTED 03/19/23   Jonelle Sidle, MD  Allergies    Patient has no known allergies.    Review of Systems   Review of Systems  Genitourinary:  Positive for flank pain.  All other systems reviewed and are negative.   Physical Exam Updated Vital Signs BP 135/89   Pulse 75   Temp 98.1 F (36.7 C) (Oral)   Resp 17   Ht 5\' 8"  (1.727 m)   Wt 68.6 kg   SpO2 95%   BMI 23.00 kg/m  Physical Exam Vitals and nursing note reviewed.  Constitutional:      General: He is not in acute distress.    Appearance: Normal appearance.  HENT:     Head: Normocephalic and atraumatic.  Eyes:     General:        Right eye: No discharge.        Left eye: No discharge.  Cardiovascular:      Comments: Regular rate and rhythm.  S1/S2 are distinct without any evidence of murmur, rubs, or gallops.  Radial pulses are 2+ bilaterally.  Dorsalis pedis pulses are 2+ bilaterally.  No evidence of pedal edema. Pulmonary:     Comments: Clear to auscultation bilaterally.  Normal effort.  No respiratory distress.  No evidence of wheezes, rales, or rhonchi heard throughout. Abdominal:     General: Abdomen is flat. Bowel sounds are normal. There is no distension.     Tenderness: There is no abdominal tenderness. There is no guarding or rebound.  Musculoskeletal:        General: Normal range of motion.     Cervical back: Neck supple.     Comments: Mild left posterior scapular tenderness.  Patient has full range of motion in the left shoulder.  There is no deltoid tenderness or trapezius tenderness.  Skin:    General: Skin is warm and dry.     Findings: No rash.  Neurological:     General: No focal deficit present.     Mental Status: He is alert.  Psychiatric:        Mood and Affect: Mood normal.        Behavior: Behavior normal.     ED Results / Procedures / Treatments   Labs (all labs ordered are listed, but only abnormal results are displayed) Labs Reviewed  CBC WITH DIFFERENTIAL/PLATELET - Abnormal; Notable for the following components:      Result Value   Platelets 144 (*)    All other components within normal limits  BASIC METABOLIC PANEL - Abnormal; Notable for the following components:   Glucose, Bld 107 (*)    Creatinine, Ser 1.36 (*)    GFR, Estimated 49 (*)    All other components within normal limits  PROTIME-INR - Abnormal; Notable for the following components:   Prothrombin Time 21.6 (*)    INR 1.9 (*)    All other components within normal limits  TROPONIN I (HIGH SENSITIVITY)  TROPONIN I (HIGH SENSITIVITY)    EKG EKG Interpretation Date/Time:  Thursday August 16 2023 11:24:22 EDT Ventricular Rate:  82 PR Interval:    QRS Duration:  88 QT Interval:  374 QTC  Calculation: 437 R Axis:   22  Text Interpretation: Atrial fibrillation Anterior infarct, old Confirmed by Edwin Dada ([id number]) on 08/16/2023 1:24:00 PM  Radiology DG Chest Portable 1 View  Result Date: 08/16/2023 CLINICAL DATA:  Shortness of breath.  Left shoulder pain. EXAM: PORTABLE CHEST 1 VIEW COMPARISON:  Chest radiograph dated June 10, 2023. FINDINGS: The heart size and mediastinal  contours are within normal limits. Both lungs are clear. No pneumothorax or pleural effusion. Degenerative changes of the bilateral shoulders and acromioclavicular joints. No acute osseous abnormality. IMPRESSION: 1. No acute cardiopulmonary findings. 2. Degenerative changes of the bilateral shoulders and acromioclavicular joints. Electronically Signed   By: Hart Robinsons M.D.   On: 08/16/2023 13:50    Procedures Procedures    Medications Ordered in ED Medications - No data to display  ED Course/ Medical Decision Making/ A&P Clinical Course as of 08/16/23 1535  Thu Aug 16, 2023  1319 CBC with Differential(!) Normal.  [CF]  1418 Basic metabolic panel(!) Mildly elevated creatinine. This seems to be at the patient's baseline. [CF]  1419 Troponin I (High Sensitivity) Initial troponin is normal.  [CF]  1519 Protime-INR(!) Prolonged in the setting of Coumadin.  [CF]  1520 Troponin I (High Sensitivity) Delta troponin is normal.  [CF]  1533 DG Chest Portable 1 View I personally ordered and interpreted the study and do not see any evidence of cardiomegaly, pneumonia.  I do agree with radiologist interpretation. [CF]    Clinical Course User Index [CF] Teressa Lower, PA-C   {   Click here for ABCD2, HEART and other calculators  Medical Decision Making SACHIN FERENCZ is a 89 y.o. male patient who presents to the emergency department today for further evaluation of left posterior scapular pain.  This seems to be more muscular given that the patient was lifting heavy objects and pushing heavy  objects in PT yesterday.  However, given the patient's age and cardiac risk factors we will evaluate for any signs of ACS today.  He currently denies chest pain.  On reevaluation, patient is still asymptomatic.  I went over all labs and imaging with him at the bedside.  I feel this is likely muscular from the intense PT work that he did the day prior.  Will treat conservatively.  He is safe for discharge at this time.  I will have him follow-up with his primary care doctor for further evaluation.  Amount and/or Complexity of Data Reviewed Labs: ordered. Decision-making details documented in ED Course. Radiology: ordered. Decision-making details documented in ED Course.    Final Clinical Impression(s) / ED Diagnoses Final diagnoses:  Acute pain of left shoulder    Rx / DC Orders ED Discharge Orders     None         Teressa Lower, PA-C 08/16/23 1535    Edwin Dada P, DO 08/17/23 2257

## 2023-08-16 NOTE — Discharge Instructions (Signed)
As we discussed, I feel this is likely muscular likely from PT yesterday.  There is no evidence of heart attack or any damage to your heart today that we can see.  This is great news.  I would like for you to follow-up with your primary care doctor for further evaluation.  You may return to the emergency department for any worsening symptoms.

## 2023-08-19 NOTE — Progress Notes (Unsigned)
VIRTUAL VISIT via TELEPHONE NOTE [facility]   I connected with [name]  on 08/20/23 at  1:30 PM by telephone and verified that I am speaking with the correct person using two identifiers.  Location: Patient: Home Provider: [facility]   I discussed the limitations, risks, security and privacy concerns of performing an evaluation and management service by telephone and the availability of in person appointments. I also discussed with the patient that there may be a patient responsible charge related to this service. The patient expressed understanding and agreed to proceed.  REASON FOR VISIT:  Follow-up for thrombocytopenia  CURRENT THERAPY: Surveillance  INTERVAL HISTORY:   [name] 89 y.o. male returns for routine follow-up of thrombocytopenia.  He was last seen by Dr. Ellin Saba on 06/07/2023.  At today's visit, he reports feeling somewhat poorly due cough, fatigue, malaise, and headaches in the setting of acute COVID-19 infection.  Otherwise, no recent hospitalizations, surgeries, or changes in baseline health status.  He was previously taking Stelara for his Crohn's disease, but switched to Entyvio infusions every 8 weeks due to his thrombocytopenia.  He is tolerating Entyvio well and continues to follow closely with gastroenterology.  He denies any abnormal bleeding such as gum bleeding, epistaxis, hematemesis, hematochezia, melena, or hematuria.  He reports easy bruising secondary to warfarin.  No petechial rash.  Apart from COVID infection, he denies any unexplained fevers or chills.  He denies any unintentional weight loss.  He denies any other infections within the past 6 months.  He is taking vitamin B12 1000 mcg tablets daily for the past 2 months.    He has 25% energy and 50% appetite. He endorses that he is maintaining a stable weight.  REVIEW OF SYSTEMS:   Review of Systems  Constitutional:  Positive for malaise/fatigue (COVID).  Negative for chills, diaphoresis, fever and weight loss.  HENT:  Positive for sore throat (COVID).   Respiratory:  Positive for cough (COVID). Negative for shortness of breath.   Cardiovascular:  Negative for chest pain and palpitations.  Gastrointestinal:  Negative for abdominal pain, blood in stool, melena, nausea and vomiting.  Neurological:  Positive for headaches (COVID). Negative for dizziness.  Psychiatric/Behavioral:  The patient has insomnia.      PHYSICAL EXAM: (per limitations of virtual telephone visit)  The patient is alert and oriented x 3, exhibiting adequate mentation, good mood, and ability to speak in full sentences and execute sound judgement.  ASSESSMENT & PLAN:  1.  Thrombocytopenia: - Patient seen at the request of Dr. Levon Hedger for thrombocytopenia. - He was started on Stelara infusion in January 2024, subsequently received subcutaneous injection on 12/25/2022 on 02/19/2023. - Platelet count on 01/14/2023 was normal at 154.  On 03/27/2023 and 04/03/2023, it was 41 and 43 respectively.  Platelet count on 04/30/2023 on 05/03/2023 showed platelet clumps.  It improved to 67K on 05/09/2023.  However there was a note about aggregation. - Stelara discontinued and switched to [facility] in August 2024 - Platelets have significantly improved following discontinuation of Stelara.   - Connective tissue disorder workup and infectious etiologies was negative.  SPEP panel was negative. - Ultrasound spleen (05/31/2023): Spleen measures 9.9 x 6 x 10.6 cm with volume 330 cc.  Overall normal in size. - He reports easy bruising secondary to warfarin (atrial fibrillation).  He denies any abnormal bleeding.   - Most recent labs (08/13/2023): Platelets 162, normal - Reviewed labs from 05/22/2023: Repeat CBC showed platelet  count improved to 186.  - PLAN: Labs and RTC in 6 months.    2.   Vitamin B12 deficiency: - B12 level was normal at 269.  However methylmalonic acid was elevated at 1641. - Received  vitamin B12 injection on 06/07/2023, and started taking vitamin B12 1000 mcg daily - Most recent labs (08/13/2023): Vitamin B12 improved at 473, MMA normalized at 303 (down from 1641) - PLAN: Continue vitamin B12 1000 mcg daily.  Will recheck B12/MMA in 6 months.  3.  Social/family history: - He lives at home with his wife.  Independent of ADLs and IADLs.  He worked for [employer] and [employer].  Non-smoker. - No family history of thrombocytopenia or leukemias.  Mother had colon cancer.  PLAN SUMMARY: >> Labs in 6 months - CBC/D, B12, MMA >> OFFICE visit in 6 months (1 week after labs)     I discussed the assessment and treatment plan with the patient. The patient was provided an opportunity to ask questions and all were answered. The patient agreed with the plan and demonstrated an understanding of the instructions.   The patient was advised to call back or seek an in-person evaluation if the symptoms worsen or if the condition fails to improve as anticipated.  I provided 16 minutes of non-face-to-face time during this encounter.  Carnella Guadalajara, PA-C 08/20/23 1:46 PM

## 2023-08-20 ENCOUNTER — Inpatient Hospital Stay: Payer: PPO | Attending: Hematology | Admitting: Physician Assistant

## 2023-08-20 DIAGNOSIS — E538 Deficiency of other specified B group vitamins: Secondary | ICD-10-CM | POA: Diagnosis not present

## 2023-08-20 DIAGNOSIS — D696 Thrombocytopenia, unspecified: Secondary | ICD-10-CM | POA: Diagnosis not present

## 2023-08-20 LAB — METHYLMALONIC ACID, SERUM: Methylmalonic Acid, Quantitative: 303 nmol/L (ref 0–378)

## 2023-08-27 ENCOUNTER — Ambulatory Visit: Payer: PPO

## 2023-08-29 ENCOUNTER — Telehealth (INDEPENDENT_AMBULATORY_CARE_PROVIDER_SITE_OTHER): Payer: Self-pay

## 2023-08-29 NOTE — Telephone Encounter (Signed)
08/29/2023: I spoke with the patient regarding the re applying for Patient assistance on his Entyvio for the year 2025. I have filled the forms out and have the patient income information. He will come by the office 09/11/2023 to sign the forms and I will forward to [employer] to see if patient qualifies for patient assistance for year 2025.    Apply. Send what we have already the last two weeks in December to Apply for assistance for the Entyvio at [facility] as the patient is currently met his out of pocket and insurance is covering at 100 % now,but will need to reapply when insurance renews again as the patient will be responsible for 20%. Will need to change the dates on current application and re submit .

## 2023-08-31 ENCOUNTER — Telehealth: Payer: Self-pay

## 2023-08-31 NOTE — Telephone Encounter (Signed)
Error

## 2023-09-03 ENCOUNTER — Ambulatory Visit: Payer: PPO | Attending: Cardiology | Admitting: *Deleted

## 2023-09-03 DIAGNOSIS — Z5181 Encounter for therapeutic drug level monitoring: Secondary | ICD-10-CM

## 2023-09-03 DIAGNOSIS — I4891 Unspecified atrial fibrillation: Secondary | ICD-10-CM

## 2023-09-03 LAB — POCT INR: INR: 1.4 — AB (ref 2.0–3.0)

## 2023-09-03 NOTE — Patient Instructions (Signed)
Been on Paxlovid for Covid Take warfarin 1 1/2 tablets tonight then increase dose to 1 tablet daily Recheck 2 weeks

## 2023-09-07 DIAGNOSIS — M47896 Other spondylosis, lumbar region: Secondary | ICD-10-CM | POA: Diagnosis not present

## 2023-09-07 DIAGNOSIS — M545 Low back pain, unspecified: Secondary | ICD-10-CM | POA: Diagnosis not present

## 2023-09-11 ENCOUNTER — Encounter: Payer: PPO | Attending: Gastroenterology | Admitting: Internal Medicine

## 2023-09-11 ENCOUNTER — Ambulatory Visit (INDEPENDENT_AMBULATORY_CARE_PROVIDER_SITE_OTHER): Payer: PPO | Admitting: Gastroenterology

## 2023-09-11 VITALS — BP 143/71 | HR 52 | Temp 97.6°F | Resp 16

## 2023-09-11 DIAGNOSIS — K50018 Crohn's disease of small intestine with other complication: Secondary | ICD-10-CM | POA: Insufficient documentation

## 2023-09-11 MED ORDER — VEDOLIZUMAB 300 MG IV SOLR
300.0000 mg | Freq: Once | INTRAVENOUS | Status: AC
Start: 2023-09-11 — End: 2023-09-11
  Administered 2023-09-11: 300 mg via INTRAVENOUS
  Filled 2023-09-11: qty 5

## 2023-09-11 NOTE — Progress Notes (Signed)
Diagnosis: Crohn's Disease  Provider:  Doylene Bode NP  Procedure: IV Infusion  IV Type: Peripheral, IV Location: L Antecubital  Entyvio (Vedolizumab), Dose: 300 mg  Infusion Start Time: 1332  Infusion Stop Time: 1418  Post Infusion IV Care: Observation period completed  Discharge: Condition: Good, Destination: Home . AVS Provided  Performed by:  Cleotilde Neer, LPN

## 2023-09-12 NOTE — Telephone Encounter (Signed)
  This message was sent via FAXCOM, a product from [employer]. [email or web address].biscom.com/                    -------Fax Transmission Report-------  To:               Recipient at [phone] Subject:          2025 Enrollment Entyvio Connect [employer] Result:           The transmission was successful. Explanation:      All Pages Ok Pages Sent:       19 Connect Time:     11 minutes, 38 seconds Transmit Time:    09/12/2023 13:30 Transfer Rate:    14400 Status Code:      0000 Retry Count:      0 Job Id:           [id number] Unique Id:        [id number] Fax Line:         7 Fax Server:       [id number]    Faxed patient assistance information with the patient income for assistance with Entyvio for 2025.

## 2023-09-17 ENCOUNTER — Ambulatory Visit: Payer: PPO | Attending: Cardiology | Admitting: *Deleted

## 2023-09-17 DIAGNOSIS — I4891 Unspecified atrial fibrillation: Secondary | ICD-10-CM | POA: Diagnosis not present

## 2023-09-17 DIAGNOSIS — Z5181 Encounter for therapeutic drug level monitoring: Secondary | ICD-10-CM | POA: Diagnosis not present

## 2023-09-17 DIAGNOSIS — L570 Actinic keratosis: Secondary | ICD-10-CM | POA: Diagnosis not present

## 2023-09-17 DIAGNOSIS — L821 Other seborrheic keratosis: Secondary | ICD-10-CM | POA: Diagnosis not present

## 2023-09-17 DIAGNOSIS — Z85828 Personal history of other malignant neoplasm of skin: Secondary | ICD-10-CM | POA: Diagnosis not present

## 2023-09-17 LAB — POCT INR: INR: 2 (ref 2.0–3.0)

## 2023-09-17 NOTE — Patient Instructions (Signed)
Increase warfarin to 1 tablet daily except 1 1/2 tablets on Mondays Pending 2nd back injection.  Date TBD.  Will need to hold warfarin x 5 days before procedure. Recheck 4 weeks

## 2023-09-18 NOTE — Telephone Encounter (Signed)
Alexis from [employer] called today [phone] ext [phone], she says she is working on the patient 2025 patient assistance forms and wanted to check with [place] and the patient to see if patient was interested in the injection verses the infusion. I advised that the patient did not feel comfortable having his wife administer the injection and felt more comfortable with having the infusions locally. Jon Gills says she will see if she can take a verbal from the patient and if so she will reach out to the patient, if no verbal allowed she will need a letter stating why the patient does not want this. I have called the patient and spoken with his wife Kathie Rhodes, I made her aware that they may be reaching out to him to get a verbal that he wants to continue on the Infusions, and does not want to go the injection route. She says she will be on the look out for calls and be sure to answer them so as to make sure she can answer any questions they may have with the [facility] connect program.

## 2023-09-19 DIAGNOSIS — M25551 Pain in right hip: Secondary | ICD-10-CM | POA: Diagnosis not present

## 2023-09-19 DIAGNOSIS — M6281 Muscle weakness (generalized): Secondary | ICD-10-CM | POA: Diagnosis not present

## 2023-09-19 DIAGNOSIS — R2681 Unsteadiness on feet: Secondary | ICD-10-CM | POA: Diagnosis not present

## 2023-09-19 DIAGNOSIS — M545 Low back pain, unspecified: Secondary | ICD-10-CM | POA: Diagnosis not present

## 2023-09-20 ENCOUNTER — Encounter (INDEPENDENT_AMBULATORY_CARE_PROVIDER_SITE_OTHER): Payer: Self-pay | Admitting: Gastroenterology

## 2023-09-20 ENCOUNTER — Ambulatory Visit (INDEPENDENT_AMBULATORY_CARE_PROVIDER_SITE_OTHER): Payer: PPO | Admitting: Gastroenterology

## 2023-09-20 ENCOUNTER — Encounter (INDEPENDENT_AMBULATORY_CARE_PROVIDER_SITE_OTHER): Payer: Self-pay

## 2023-09-20 VITALS — BP 157/76 | HR 66 | Temp 98.0°F | Ht 68.0 in | Wt 153.1 lb

## 2023-09-20 DIAGNOSIS — C7A092 Malignant carcinoid tumor of the stomach: Secondary | ICD-10-CM | POA: Diagnosis not present

## 2023-09-20 DIAGNOSIS — K50018 Crohn's disease of small intestine with other complication: Secondary | ICD-10-CM | POA: Diagnosis not present

## 2023-09-20 DIAGNOSIS — E3409 Other carcinoid syndrome: Secondary | ICD-10-CM | POA: Diagnosis not present

## 2023-09-20 NOTE — Progress Notes (Signed)
[name], M.D. Gastroenterology & Hepatology [facility] [address] [place], [place] [address]  Primary Care Physician: Estanislado Pandy, MD [address] [place] [address]  I will communicate my assessment and recommendations to the referring MD via EMR.  Problems: Crohn's disease status post resection complicated by recurrent stricture   History of Present Illness: [name] is a 89 y.o. male male with past medical history of small bowel Crohn's disease status post resection complicated by recurrent stricture , C, Stelara induced thrombocytopenia, C difficile colitis, BPH, coronary artery disease, atrial fibrillation, GERD, hypothyroidism , who presents for follow up of Crohn's disease.  The patient was last seen on 08/09/2019 for. At that time, the patient was continued on Entyvio every 8 weeks.  He was advised to monitor for recurrence of headache or shoulder pain with next dose of Entyvio.  Has felt well. States his stools are dark due to iron intake. He has received 4 infusions of Entyvio, next infusion will be in January 2025. The patient denies having any nausea, vomiting, fever, chills, hematochezia, melena, hematemesis, abdominal distention, abdominal pain, diarrhea, jaundice, pruritus or weight loss.  He reports that his shoulder pain has resolved. He believes it was related to PT therapy he had the day before his pain started. Has had very occasional headaches but they are very mild and tolerable.  Patient had COVID since the last appointment and took Paxlovid. He finished this 3 weeks ago. He had some mild respiratory symptoms and had positive home testing. Did not require hospitalization.  Most recent labs from 06/10/2023 showed a creatinine of 1.43, BUN 15, sodium 137, potassium 4.0, alkaline phosphatase 57, albumin 4.1, AST 19, ALT 13, total bilirubin 0.9, CBC with WBC 6.4, hemoglobin 14.0, platelets 143.  Repeat  labs on 08/16/2023 showed BMP with creatinine 1.36, BUN 16, platelets 144.  Last flu shot:2024 Last pneumonia shot:possibly in the past, may get it in 10/2023 Last evaluation by dermatology: saw him on 09/17/2023 COVID-19 shot: last booster in 2024  Last EGD: 01/02/23 - Discolored mucosa in the esophagus. Biopsied.                           - 2 cm hiatal hernia.                           - Normal stomach.                           - Submucosal nodule - NET found in the duodenum.                            Slightly larger in size. Biopsied.                           - Buried metallic clip in between layers of second                            portion of small bowel.  Pathology showed presence of healed distal esophagus, also presence of a well-differentiated neuroendocrine tumor similar in size as in previous endoscopy,.  Recommended repeat EGD in 1 year, we will also need to repeat a CT of the abdomen  and pelvis with IV contrast and chromogranin A levels at that time.   Last Colonoscopy: 03/23/2016 - Two small polyps at the recto-sigmoid colon and in the cecum, removed with a cold snare. Resected and retrieved. - Mild diverticulosis in the sigmoid colon. - External and internal hemorrhoids  Past Medical History: Past Medical History:  Diagnosis Date   Anemia    Atrial fibrillation (HCC)    BPH (benign prostatic hyperplasia)    CAD (coronary artery disease)    Catheterization 2004, mild/moderate nonobstructive disease  /   nuclear, 2007, small inferior scar // no ischemia   Crohn's disease (HCC)    Elevated PSA    GERD (gastroesophageal reflux disease)    History of kidney stones    History of pneumonia    Hypothyroidism    Mitral regurgitation    Pneumonia    SBO (small bowel obstruction) (HCC)    Urinary retention    Wears glasses     Past Surgical History: Past Surgical History:  Procedure Laterality Date   AGILE CAPSULE  10/18/2011   Procedure: AGILE CAPSULE;  Surgeon:  Malissa Hippo, MD;  [facility];  Service: Endoscopy;  Laterality: N/A;  730   BIOPSY  10/05/2022   Procedure: BIOPSY;  Surgeon: Dolores Frame, MD;  [facility];  Service: Gastroenterology;;   BIOPSY  01/02/2023   Procedure: BIOPSY;  Surgeon: Dolores Frame, MD;  [facility];  Service: Gastroenterology;;   CARDIAC CATHETERIZATION  2004   CATARACT EXTRACTION W/PHACO Right 06/27/2021   Procedure: CATARACT EXTRACTION PHACO AND INTRAOCULAR LENS PLACEMENT (IOC);  Surgeon: Fabio Pierce, MD;  Location: [facility];  Service: Ophthalmology;  Laterality: Right;  CDE 21.98   CATARACT EXTRACTION W/PHACO Left 07/11/2021   Procedure: CATARACT EXTRACTION PHACO AND INTRAOCULAR LENS PLACEMENT LEFT EYE;  Surgeon: Fabio Pierce, MD;  Location: [facility];  Service: Ophthalmology;  Laterality: Left;  CDE=10.42   CHOLECYSTECTOMY  2010   Dr. Gabriel Cirri   COLONOSCOPY  2008   [facility]   COLONOSCOPY N/A 03/23/2016   Procedure: COLONOSCOPY;  Surgeon: Malissa Hippo, MD;  [facility];  Service: Endoscopy;  Laterality: N/A;  1:00   CYSTOSCOPY WITH INSERTION OF UROLIFT     ESOPHAGEAL BRUSHING  10/05/2022   Procedure: ESOPHAGEAL BRUSHING;  Surgeon: Dolores Frame, MD;  [facility];  Service: Gastroenterology;;   ESOPHAGOGASTRODUODENOSCOPY  11/08/2011   Procedure: ESOPHAGOGASTRODUODENOSCOPY (EGD);  Surgeon: Malissa Hippo, MD;  [facility];  Service: Endoscopy;  Laterality: N/A;  300   ESOPHAGOGASTRODUODENOSCOPY (EGD) WITH PROPOFOL N/A 10/05/2022   Procedure: ESOPHAGOGASTRODUODENOSCOPY (EGD) WITH PROPOFOL;  Surgeon: Dolores Frame, MD;  [facility];  Service: Gastroenterology;  Laterality: N/A;   ESOPHAGOGASTRODUODENOSCOPY (EGD) WITH PROPOFOL N/A 01/02/2023   Procedure: ESOPHAGOGASTRODUODENOSCOPY (EGD) WITH PROPOFOL;  Surgeon: Dolores Frame, MD;  [facility];  Service:  Gastroenterology;  Laterality: N/A;  1:30 pm, asa 3   HEMOSTASIS CLIP PLACEMENT  10/05/2022   Procedure: HEMOSTASIS CLIP PLACEMENT;  Surgeon: Dolores Frame, MD;  [facility];  Service: Gastroenterology;;   POLYPECTOMY  03/23/2016   Procedure: POLYPECTOMY;  Surgeon: Malissa Hippo, MD;  [facility];  Service: Endoscopy;;  Cecal polyp removed via cold forceps recto-sigmoid polyp removed via cold snare   POLYPECTOMY  10/05/2022   Procedure: POLYPECTOMY;  Surgeon: Dolores Frame, MD;  [facility];  Service: Gastroenterology;;   TRANSURETHRAL RESECTION  OF PROSTATE N/A 04/29/2020   Procedure: TRANSURETHRAL RESECTION OF THE PROSTATE (TURP);  Surgeon: Marcine Matar, MD;  Location: [facility];  Service: Urology;  Laterality: N/A;  90 MINS    Family History: Family History  Problem Relation Age of Onset   Colon cancer Mother    Heart disease Father    Stroke Brother    Healthy Daughter     Social History: Social History   Tobacco Use  Smoking Status Never   Passive exposure: Never  Smokeless Tobacco Never   Social History   Substance and Sexual Activity  Alcohol Use No   Alcohol/week: 0.0 standard drinks of alcohol   Social History   Substance and Sexual Activity  Drug Use No    Allergies: No Known Allergies  Medications: Current Outpatient Medications  Medication Sig Dispense Refill   acetaminophen (TYLENOL) 500 MG tablet Take 500 mg by mouth every 6 (six) hours as needed for mild pain or moderate pain.     CALCIUM PO Take 300 mg by mouth in the morning and at bedtime.     carvedilol (COREG) 12.5 MG tablet Take 1 tablet (12.5 mg total) by mouth 2 (two) times daily. 60 tablet 0   Cholecalciferol (VITAMIN D3) 50 MCG (2000 UT) TABS Take 2,000 Units by mouth daily.     cyanocobalamin (VITAMIN B12) 1000 MCG tablet Take 1,000 mcg by mouth daily.     diltiazem (CARDIZEM CD) 180 MG 24 hr capsule Take 180  mg by mouth 2 (two) times daily.     ferrous sulfate (FEROSUL) 325 (65 FE) MG tablet Take 325 mg by mouth daily with breakfast.     fluticasone (FLONASE) 50 MCG/ACT nasal spray Place 1 spray into both nostrils daily.     levothyroxine (SYNTHROID, LEVOTHROID) 75 MCG tablet Take 75 mcg by mouth daily before breakfast.     magnesium oxide (MAG-OX) 400 (240 Mg) MG tablet Take 400 mg by mouth 2 (two) times daily.     Melatonin 10 MG TABS Take 10 mg by mouth at bedtime.     nitroGLYCERIN (NITROSTAT) 0.4 MG SL tablet Place 0.4 mg under the tongue every 5 (five) minutes as needed. For chest pains. May repeat for up to 3 doses.     omeprazole (PRILOSEC) 40 MG capsule Take 1 capsule (40 mg total) by mouth daily. 90 capsule 1   Probiotic Product (ALIGN) 4 MG CAPS Take 4 mg by mouth in the morning.     traZODone (DESYREL) 50 MG tablet Take 50 mg by mouth at bedtime.     Vedolizumab (ENTYVIO IV) Inject into the vein. Every 8 weeks.     warfarin (COUMADIN) 2.5 MG tablet TAKE 1 TABLET DAILY EXCEPT 1/2 TABLET ON TUESDAYS OR AS DIRECTED (Patient taking differently: On Monday 3.75 mg and 2.5 mg All other days.) 30 tablet 5   No current facility-administered medications for this visit.    Review of Systems: GENERAL: negative for malaise, night sweats HEENT: No changes in hearing or vision, no nose bleeds or other nasal problems. NECK: Negative for lumps, goiter, pain and significant neck swelling RESPIRATORY: Negative for cough, wheezing CARDIOVASCULAR: Negative for chest pain, leg swelling, palpitations, orthopnea GI: SEE HPI MUSCULOSKELETAL: Negative for joint pain or swelling, back pain, and muscle pain. SKIN: Negative for lesions, rash PSYCH: Negative for sleep disturbance, mood disorder and recent psychosocial stressors. HEMATOLOGY Negative for prolonged bleeding, bruising easily, and swollen nodes. ENDOCRINE: Negative for cold or heat intolerance, polyuria, polydipsia  and goiter. NEURO: negative for  tremor, gait imbalance, syncope and seizures. The remainder of the review of systems is noncontributory.   Physical Exam: BP (!) 157/76 (BP Location: Left Arm, Patient Position: Sitting, Cuff Size: Normal)   Pulse 66   Temp 98 F (36.7 C) (Temporal)   Ht 5\' 8"  (1.727 m)   Wt 153 lb 1.6 oz (69.4 kg)   BMI 23.28 kg/m  GENERAL: The patient is AO x3, in no acute distress. HEENT: Head is normocephalic and atraumatic. EOMI are intact. Mouth is well hydrated and without lesions. NECK: Supple. No masses LUNGS: Clear to auscultation. No presence of rhonchi/wheezing/rales. Adequate chest expansion HEART: RRR, normal s1 and s2. ABDOMEN: Soft, nontender, no guarding, no peritoneal signs, and nondistended. BS +. No masses. EXTREMITIES: Without any cyanosis, clubbing, rash, lesions or edema. NEUROLOGIC: AOx3, no focal motor deficit. SKIN: no jaundice, no rashes  Imaging/Labs: as above  I personally reviewed and interpreted the available labs, imaging and endoscopic files.  Impression and Plan: JATERRIUS GERLITZ is a 89 y.o. male male with past medical history of small bowel Crohn's disease status post resection complicated by recurrent stricture , C, Stelara induced thrombocytopenia, C difficile colitis, BPH, coronary artery disease, atrial fibrillation, GERD, hypothyroidism , who presents for follow up of Crohn's disease.  Patient has presented adequate control of symptoms while on Entyvio after receiving 4 doses of the medication.  I believe he is in clinical remission at this moment.  He had some mild headache with this medication but has been able to tolerate it better now and would like to continue with it.  I think this is reasonable as he has presented significant reactions to Stelara in the past and unfortunately he has an active duodenal neuroendocrine tumor that precludes the use of Humira.  I asked the patient to fax the labs he will have drawn next week with his PCP.  He is up-to-date on his  preventative measures.  Finally, regarding his duodenal neuroendocrine tumor, we are currently performing expectant observation of this.  Will repeat CT of the abdomen pelvis with IV contrast and chromogranin A levels in March 2025.  -Continue Entyvio every 8 weeks -Patient should ask PCP to fax to my office labs that will be drawn next week -Follow-up with dermatology -Will repeat CT of the pelvis with IV contrast repeat and chromogranin A in March 2025  All questions were answered.      Anthony Blazing, MD Gastroenterology and Hepatology [facility]

## 2023-09-20 NOTE — Patient Instructions (Addendum)
Continue Entyvio every 8 weeks Please ask PCP to fax to my office labs that will be drawn next week Follow-up with dermatology Will repeat CT of the pelvis with IV contrast repeat chromogranin A in March 2025

## 2023-09-20 NOTE — Telephone Encounter (Signed)
I spoke with Jon Gills with [employer] while the patient was here in the office. I advised that I had a formal letter written out detailing the patient preference of doing the infusions verses the injections. I had the patient sign this and I have faxed this letter to [facility]. While patient here Jon Gills told the patient and myself that the patient looks like he qualifies for the program,but will be unable to process the application until 10/18/2023, as of right now 2024 the patient has met his deductible and is not currently qualified. Per Jon Gills, she has in a reminder file to process the application on 10/18/2023, she also made the patient aware of this. Jon Gills also gave the patient her direct phone number with extension to call if any issues.

## 2023-09-20 NOTE — Telephone Encounter (Signed)
This message was sent via FAXCOM, a product from [employer]. [email or web address].biscom.com/                    -------Fax Transmission Report-------  To:               Recipient at [phone] Subject:          Letter to Jon Gills stating the patient prefers infusions verse Injections. Result:           The transmission was successful. Explanation:      All Pages Ok Pages Sent:       3 Connect Time:     1 minutes, 33 seconds Transmit Time:    09/20/2023 16:00 Transfer Rate:    14400 Status Code:      0000 Retry Count:      0 Job Id:           [id number] Unique Id:        [id number] Fax Line:         17 Fax Server:       [employer]

## 2023-09-27 DIAGNOSIS — L110 Acquired keratosis follicularis: Secondary | ICD-10-CM | POA: Diagnosis not present

## 2023-09-27 DIAGNOSIS — M79671 Pain in right foot: Secondary | ICD-10-CM | POA: Diagnosis not present

## 2023-09-27 DIAGNOSIS — M79672 Pain in left foot: Secondary | ICD-10-CM | POA: Diagnosis not present

## 2023-09-27 DIAGNOSIS — E7801 Familial hypercholesterolemia: Secondary | ICD-10-CM | POA: Diagnosis not present

## 2023-09-27 DIAGNOSIS — I739 Peripheral vascular disease, unspecified: Secondary | ICD-10-CM | POA: Diagnosis not present

## 2023-09-27 DIAGNOSIS — M79674 Pain in right toe(s): Secondary | ICD-10-CM | POA: Diagnosis not present

## 2023-09-27 DIAGNOSIS — E782 Mixed hyperlipidemia: Secondary | ICD-10-CM | POA: Diagnosis not present

## 2023-09-27 DIAGNOSIS — I10 Essential (primary) hypertension: Secondary | ICD-10-CM | POA: Diagnosis not present

## 2023-09-27 DIAGNOSIS — M79675 Pain in left toe(s): Secondary | ICD-10-CM | POA: Diagnosis not present

## 2023-09-27 DIAGNOSIS — N183 Chronic kidney disease, stage 3 unspecified: Secondary | ICD-10-CM | POA: Diagnosis not present

## 2023-09-27 DIAGNOSIS — E7800 Pure hypercholesterolemia, unspecified: Secondary | ICD-10-CM | POA: Diagnosis not present

## 2023-09-27 DIAGNOSIS — E7849 Other hyperlipidemia: Secondary | ICD-10-CM | POA: Diagnosis not present

## 2023-09-27 DIAGNOSIS — E039 Hypothyroidism, unspecified: Secondary | ICD-10-CM | POA: Diagnosis not present

## 2023-09-28 ENCOUNTER — Encounter (INDEPENDENT_AMBULATORY_CARE_PROVIDER_SITE_OTHER): Payer: Self-pay

## 2023-10-01 ENCOUNTER — Telehealth: Payer: Self-pay | Admitting: Cardiology

## 2023-10-01 NOTE — Telephone Encounter (Signed)
ERROR

## 2023-10-03 DIAGNOSIS — E7801 Familial hypercholesterolemia: Secondary | ICD-10-CM | POA: Diagnosis not present

## 2023-10-03 DIAGNOSIS — E039 Hypothyroidism, unspecified: Secondary | ICD-10-CM | POA: Diagnosis not present

## 2023-10-03 DIAGNOSIS — R42 Dizziness and giddiness: Secondary | ICD-10-CM | POA: Diagnosis not present

## 2023-10-03 DIAGNOSIS — K56600 Partial intestinal obstruction, unspecified as to cause: Secondary | ICD-10-CM | POA: Diagnosis not present

## 2023-10-03 DIAGNOSIS — R7301 Impaired fasting glucose: Secondary | ICD-10-CM | POA: Diagnosis not present

## 2023-10-03 DIAGNOSIS — R531 Weakness: Secondary | ICD-10-CM | POA: Diagnosis not present

## 2023-10-03 DIAGNOSIS — D649 Anemia, unspecified: Secondary | ICD-10-CM | POA: Diagnosis not present

## 2023-10-03 DIAGNOSIS — Z23 Encounter for immunization: Secondary | ICD-10-CM | POA: Diagnosis not present

## 2023-10-03 DIAGNOSIS — N1330 Unspecified hydronephrosis: Secondary | ICD-10-CM | POA: Diagnosis not present

## 2023-10-03 DIAGNOSIS — G2581 Restless legs syndrome: Secondary | ICD-10-CM | POA: Diagnosis not present

## 2023-10-03 DIAGNOSIS — I10 Essential (primary) hypertension: Secondary | ICD-10-CM | POA: Diagnosis not present

## 2023-10-10 ENCOUNTER — Other Ambulatory Visit: Payer: Self-pay | Admitting: Cardiology

## 2023-10-10 DIAGNOSIS — I4891 Unspecified atrial fibrillation: Secondary | ICD-10-CM

## 2023-10-11 NOTE — Telephone Encounter (Signed)
Prescription refill request received for warfarin Lov: 05/30/23 Diona Browner)  Next INR check: 10/19/23 Warfarin tablet strength: 2.5mg   Appropriate dose. Refill sent.

## 2023-10-12 ENCOUNTER — Telehealth (INDEPENDENT_AMBULATORY_CARE_PROVIDER_SITE_OTHER): Payer: Self-pay | Admitting: Gastroenterology

## 2023-10-12 NOTE — Telephone Encounter (Signed)
I received the results of the most recent blood workup performed on 09/27/2023 which showed a CBC with WBC 6.8, hemoglobin 13.5, platelets 182, CMP with send 136, potassium 4.1, albumin 4.3, AST 11, alkaline phosphatase 65, ALT 9, total bilirubin 0.9, creatinine 1.35, TSH 2.6 and magnesium 1.5.

## 2023-10-15 ENCOUNTER — Encounter (INDEPENDENT_AMBULATORY_CARE_PROVIDER_SITE_OTHER): Payer: Self-pay

## 2023-10-15 ENCOUNTER — Encounter (INDEPENDENT_AMBULATORY_CARE_PROVIDER_SITE_OTHER): Payer: Self-pay | Admitting: *Deleted

## 2023-10-19 NOTE — Telephone Encounter (Signed)
 I spoke with [name] with [employer] [phone] she says she ran patient information yesterday 10/18/2023 and She will follow up on his eligibility today and will let me and the patient know if approved or not later today 10/19/2023.

## 2023-10-22 ENCOUNTER — Ambulatory Visit: Payer: PPO | Attending: Cardiology | Admitting: *Deleted

## 2023-10-22 ENCOUNTER — Other Ambulatory Visit: Payer: Self-pay | Admitting: Gastroenterology

## 2023-10-22 DIAGNOSIS — I4891 Unspecified atrial fibrillation: Secondary | ICD-10-CM | POA: Diagnosis not present

## 2023-10-22 DIAGNOSIS — Z5181 Encounter for therapeutic drug level monitoring: Secondary | ICD-10-CM

## 2023-10-22 LAB — POCT INR: INR: 1.3 — AB (ref 2.0–3.0)

## 2023-10-22 NOTE — Telephone Encounter (Signed)
 I called left a vm for Alexis, that if she needed anything to call me back.   Alexis from [employer] called on Friday 10/18/2022 says the patient was approved for patient assistance for his Entyvio for 2025, to call her back.

## 2023-10-22 NOTE — Telephone Encounter (Signed)
 Alexis from [employer] called me today to find out when and where to ship the patient medication. I gave her the number to Wynelle Link, with [facility] to discuss this. The patient was made aware he was approved.

## 2023-10-22 NOTE — Patient Instructions (Signed)
 Pending 2nd back injection tomorrow 10/23/23.  Has been off warfarin x 5 days Starting 10/23/23 take extra 1/2 tablet of warfarin x 2 days then resume 1 tablet daily except 1 1/2 tablets on Mondays Recheck 2 weeks

## 2023-10-23 ENCOUNTER — Telehealth: Payer: Self-pay

## 2023-10-23 ENCOUNTER — Telehealth (INDEPENDENT_AMBULATORY_CARE_PROVIDER_SITE_OTHER): Payer: Self-pay

## 2023-10-23 DIAGNOSIS — M5416 Radiculopathy, lumbar region: Secondary | ICD-10-CM | POA: Diagnosis not present

## 2023-10-23 NOTE — Telephone Encounter (Signed)
 Alexis from [employer] called on Friday 10/18/2022 says the patient was approved for patient assistance for his IV Entyvio  for 2025.Please see under media tab scanned document on what is needed from you all fromEntyvio Connect. Thanks  Mon 14:05 KD Wanetta Barrack, CPhT Thank you!  1  Mon 14:08 thank you!  Mon 14:08 Hey I have entyvio  connect on the phone and they need information from you like when and where to ship his medication  What is your direct number I can try to transfer the call.  Mon 14:28 KD Wanetta Barrack, CPhT [phone], my cell. Working from home today  Mon 14:28 Is it ok to give her that number or do you want her number and ext  Mon 14:28 KD Wanetta Barrack, CPhT she can call that number  Mon 14:29 her name is Thersia [phone] ext [phone]  Mon 14:29 KD Wanetta Barrack, CPhT thank you!  Mon 14:29 Ok, she will be giving you a call. Thanks.  1  Mon 14:30 Good morning, did you speak with Thersia at Enty[employer]io  connect yesterday? I saw you had done a prior authorization on Mr. Rohrman this morning.  12 mins KD Wanetta Barrack, CPhT Yes I did, we set up delivery  Now KD ok, thanks

## 2023-10-23 NOTE — Telephone Encounter (Signed)
 Entyvio IV approved through 10/15/2024. Please see media tab dated 10/22/2023.

## 2023-10-23 NOTE — Telephone Encounter (Signed)
 Auth Submission: NO AUTH NEEDED Site of care: Site of care: [facility] Payer: [employer] PPO Medication & CPT/J Code(s) submitted: Entyvio  (Vedolizumab ) J3380 Route of submission (phone, fax, portal): phone Phone # Fax # Auth type: Buy/Bill PB Units/visits requested: 300mg , q8weeks Reference number: [id number] Approval from: 10/23/23 to 10/15/24

## 2023-11-05 ENCOUNTER — Ambulatory Visit: Payer: PPO | Attending: Cardiology | Admitting: *Deleted

## 2023-11-05 DIAGNOSIS — Z5181 Encounter for therapeutic drug level monitoring: Secondary | ICD-10-CM | POA: Diagnosis not present

## 2023-11-05 DIAGNOSIS — I4891 Unspecified atrial fibrillation: Secondary | ICD-10-CM | POA: Diagnosis not present

## 2023-11-05 LAB — POCT INR: INR: 2 (ref 2.0–3.0)

## 2023-11-05 NOTE — Patient Instructions (Signed)
Continue warfarin 1 tablet daily except 1 1/2 tablets on Mondays Recheck 3 weeks

## 2023-11-06 ENCOUNTER — Encounter: Payer: PPO | Attending: Gastroenterology | Admitting: *Deleted

## 2023-11-06 VITALS — BP 162/67 | HR 70 | Temp 97.7°F | Resp 18 | Ht 68.0 in | Wt 155.0 lb

## 2023-11-06 DIAGNOSIS — K50018 Crohn's disease of small intestine with other complication: Secondary | ICD-10-CM | POA: Insufficient documentation

## 2023-11-06 MED ORDER — SODIUM CHLORIDE 0.9 % IV SOLN
300.0000 mg | Freq: Once | INTRAVENOUS | Status: AC
  Administered 2023-11-06: 300 mg via INTRAVENOUS
  Filled 2023-11-06: qty 5

## 2023-11-06 NOTE — Progress Notes (Signed)
Diagnosis: Crohn's Disease  Provider:  Doylene Bode NP  Procedure: IV Infusion  IV Type: Peripheral, IV Location: L Antecubital  Entyvio (Vedolizumab), Dose: 300 mg  Infusion Start Time: 1332  Infusion Stop Time: 1411  Post Infusion IV Care: Observation period completed and Peripheral IV Discontinued  Discharge: Condition: Good, Destination: Home . AVS Provided  Performed by:  Tonye Pearson, RN

## 2023-11-11 ENCOUNTER — Other Ambulatory Visit (INDEPENDENT_AMBULATORY_CARE_PROVIDER_SITE_OTHER): Payer: Self-pay | Admitting: Gastroenterology

## 2023-11-16 DIAGNOSIS — M545 Low back pain, unspecified: Secondary | ICD-10-CM | POA: Diagnosis not present

## 2023-11-16 DIAGNOSIS — G4700 Insomnia, unspecified: Secondary | ICD-10-CM | POA: Diagnosis not present

## 2023-11-16 DIAGNOSIS — M47816 Spondylosis without myelopathy or radiculopathy, lumbar region: Secondary | ICD-10-CM | POA: Diagnosis not present

## 2023-11-16 DIAGNOSIS — I10 Essential (primary) hypertension: Secondary | ICD-10-CM | POA: Diagnosis not present

## 2023-11-16 DIAGNOSIS — I482 Chronic atrial fibrillation, unspecified: Secondary | ICD-10-CM | POA: Diagnosis not present

## 2023-11-16 DIAGNOSIS — E039 Hypothyroidism, unspecified: Secondary | ICD-10-CM | POA: Diagnosis not present

## 2023-11-16 DIAGNOSIS — E782 Mixed hyperlipidemia: Secondary | ICD-10-CM | POA: Diagnosis not present

## 2023-11-26 ENCOUNTER — Ambulatory Visit: Payer: PPO | Attending: Cardiology | Admitting: *Deleted

## 2023-11-26 DIAGNOSIS — I4891 Unspecified atrial fibrillation: Secondary | ICD-10-CM

## 2023-11-26 DIAGNOSIS — Z5181 Encounter for therapeutic drug level monitoring: Secondary | ICD-10-CM

## 2023-11-26 LAB — POCT INR: INR: 2.2 (ref 2.0–3.0)

## 2023-11-26 NOTE — Patient Instructions (Signed)
 Continue warfarin 1 tablet daily except 1 1/2 tablets on Mondays Recheck 4 weeks

## 2023-11-30 ENCOUNTER — Encounter (INDEPENDENT_AMBULATORY_CARE_PROVIDER_SITE_OTHER): Payer: Self-pay | Admitting: *Deleted

## 2023-12-06 ENCOUNTER — Emergency Department (HOSPITAL_COMMUNITY): Payer: PPO

## 2023-12-06 ENCOUNTER — Ambulatory Visit: Payer: PPO | Admitting: Cardiology

## 2023-12-06 ENCOUNTER — Other Ambulatory Visit: Payer: Self-pay

## 2023-12-06 ENCOUNTER — Encounter (HOSPITAL_COMMUNITY): Payer: Self-pay | Admitting: Emergency Medicine

## 2023-12-06 ENCOUNTER — Emergency Department (HOSPITAL_COMMUNITY)
Admission: EM | Admit: 2023-12-06 | Discharge: 2023-12-06 | Disposition: A | Discharge status: Home / Self Care | Payer: PPO | Attending: Emergency Medicine | Admitting: Emergency Medicine

## 2023-12-06 DIAGNOSIS — M546 Pain in thoracic spine: Secondary | ICD-10-CM | POA: Insufficient documentation

## 2023-12-06 DIAGNOSIS — R109 Unspecified abdominal pain: Secondary | ICD-10-CM | POA: Insufficient documentation

## 2023-12-06 DIAGNOSIS — Z7901 Long term (current) use of anticoagulants: Secondary | ICD-10-CM | POA: Insufficient documentation

## 2023-12-06 DIAGNOSIS — R0602 Shortness of breath: Secondary | ICD-10-CM | POA: Diagnosis not present

## 2023-12-06 DIAGNOSIS — M549 Dorsalgia, unspecified: Secondary | ICD-10-CM | POA: Diagnosis present

## 2023-12-06 DIAGNOSIS — J984 Other disorders of lung: Secondary | ICD-10-CM | POA: Diagnosis not present

## 2023-12-06 DIAGNOSIS — K449 Diaphragmatic hernia without obstruction or gangrene: Secondary | ICD-10-CM | POA: Diagnosis not present

## 2023-12-06 DIAGNOSIS — K5730 Diverticulosis of large intestine without perforation or abscess without bleeding: Secondary | ICD-10-CM | POA: Diagnosis not present

## 2023-12-06 DIAGNOSIS — I10 Essential (primary) hypertension: Secondary | ICD-10-CM | POA: Diagnosis not present

## 2023-12-06 DIAGNOSIS — R918 Other nonspecific abnormal finding of lung field: Secondary | ICD-10-CM | POA: Diagnosis not present

## 2023-12-06 DIAGNOSIS — N281 Cyst of kidney, acquired: Secondary | ICD-10-CM | POA: Diagnosis not present

## 2023-12-06 LAB — COMPREHENSIVE METABOLIC PANEL
ALT: 13 U/L (ref 0–44)
AST: 19 U/L (ref 15–41)
Albumin: 3.9 g/dL (ref 3.5–5.0)
Alkaline Phosphatase: 59 U/L (ref 38–126)
Anion gap: 10 (ref 5–15)
BUN: 19 mg/dL (ref 8–23)
CO2: 26 mmol/L (ref 22–32)
Calcium: 9.4 mg/dL (ref 8.9–10.3)
Chloride: 103 mmol/L (ref 98–111)
Creatinine, Ser: 1.3 mg/dL — ABNORMAL HIGH (ref 0.61–1.24)
GFR, Estimated: 52 mL/min — ABNORMAL LOW (ref >60)
Glucose, Bld: 111 mg/dL — ABNORMAL HIGH (ref 70–99)
Potassium: 4.2 mmol/L (ref 3.5–5.1)
Sodium: 139 mmol/L (ref 135–145)
Total Bilirubin: 0.8 mg/dL (ref 0.0–1.2)
Total Protein: 6.8 g/dL (ref 6.5–8.1)

## 2023-12-06 LAB — URINALYSIS, ROUTINE W REFLEX MICROSCOPIC
Bacteria, UA: NONE SEEN
Bilirubin Urine: NEGATIVE
Glucose, UA: NEGATIVE mg/dL
Ketones, ur: NEGATIVE mg/dL
Leukocytes,Ua: NEGATIVE
Nitrite: NEGATIVE
Protein, ur: NEGATIVE mg/dL
Specific Gravity, Urine: 1.015 (ref 1.005–1.030)
pH: 6 (ref 5.0–8.0)

## 2023-12-06 LAB — CBC WITH DIFFERENTIAL/PLATELET
Abs Immature Granulocytes: 0.02 10*3/uL (ref 0.00–0.07)
Basophils Absolute: 0.1 10*3/uL (ref 0.0–0.1)
Basophils Relative: 1 %
Eosinophils Absolute: 0.1 10*3/uL (ref 0.0–0.5)
Eosinophils Relative: 1 %
HCT: 42.5 % (ref 39.0–52.0)
Hemoglobin: 14.4 g/dL (ref 13.0–17.0)
Immature Granulocytes: 0 %
Lymphocytes Relative: 21 %
Lymphs Abs: 1.6 10*3/uL (ref 0.7–4.0)
MCH: 30.6 pg (ref 26.0–34.0)
MCHC: 33.9 g/dL (ref 30.0–36.0)
MCV: 90.2 fL (ref 80.0–100.0)
Monocytes Absolute: 0.4 10*3/uL (ref 0.1–1.0)
Monocytes Relative: 5 %
Neutro Abs: 5.3 10*3/uL (ref 1.7–7.7)
Neutrophils Relative %: 72 %
Platelets: 173 10*3/uL (ref 150–400)
RBC: 4.71 MIL/uL (ref 4.22–5.81)
RDW: 13.8 % (ref 11.5–15.5)
WBC: 7.4 10*3/uL (ref 4.0–10.5)
nRBC: 0 % (ref 0.0–0.2)

## 2023-12-06 LAB — TROPONIN I (HIGH SENSITIVITY): Troponin I (High Sensitivity): 3 ng/L (ref <18)

## 2023-12-06 LAB — PROTIME-INR
INR: 2.2 — ABNORMAL HIGH (ref 0.8–1.2)
Prothrombin Time: 24.9 s — ABNORMAL HIGH (ref 11.4–15.2)

## 2023-12-06 LAB — LIPASE, BLOOD: Lipase: 49 U/L (ref 11–51)

## 2023-12-06 MED ORDER — LIDOCAINE 5 % EX PTCH
1.0000 | MEDICATED_PATCH | Freq: Once | CUTANEOUS | Status: DC
  Administered 2023-12-06: 1 via TRANSDERMAL
  Filled 2023-12-06: qty 1

## 2023-12-06 MED ORDER — LIDOCAINE 5 % EX PTCH: 1.0000 | MEDICATED_PATCH | CUTANEOUS | 0 refills | Status: DC

## 2023-12-06 MED ORDER — IOHEXOL 300 MG/ML  SOLN
100.0000 mL | Freq: Once | INTRAMUSCULAR | Status: AC | PRN
  Administered 2023-12-06: 100 mL via INTRAVENOUS

## 2023-12-06 NOTE — ED Notes (Signed)
 Patient transported to CT

## 2023-12-06 NOTE — ED Triage Notes (Signed)
Pt added he has had some weakness as well

## 2023-12-06 NOTE — ED Triage Notes (Signed)
Pt complains of pain on his left lower back and side. Pt denies any cp, or dysuria. Pt endorses sob. Pt has hx of afib

## 2023-12-06 NOTE — Discharge Instructions (Signed)
If you develop worsening, recurrent, or continued back pain, numbness or weakness in the legs, incontinence of your bowels or bladders, numbness of your buttocks, fever, abdominal pain, or any other new/concerning symptoms then return to the ER for evaluation.  

## 2023-12-06 NOTE — ED Provider Notes (Signed)
[place] EMERGENCY DEPARTMENT AT [facility] Provider Note   CSN: [id number] Arrival date & time: 12/06/23  4098     History  Chief Complaint  Patient presents with   Back Pain   Flank Pain    [name] is a 89 y.o. male.  HPI 89 year old male presents with left-sided back pain.  Is been ongoing for couple weeks.  Typically his worst in the morning and sometimes throughout the day.  Sometimes gets worse with certain movements or when he exercises.  It was worse than typical this morning which prompted his ED evaluation.  He has felt a little short of breath over these last couple weeks as well.  No fevers, cough, chest pain, abdominal pain.  No weakness in his legs, numbness in his legs or incontinence.  He took some Tylenol this morning and his pain is pretty minimal right now.  The pain does not radiate.  Home Medications Prior to Admission medications   Medication Sig Start Date End Date Taking? Authorizing Provider  acetaminophen (TYLENOL) 500 MG tablet Take 1,000 mg by mouth every 6 (six) hours as needed for mild pain (pain score 1-3) or moderate pain (pain score 4-6).   Yes [provider]  CALCIUM PO Take 300 mg by mouth in the morning and at bedtime.   Yes [provider]  carvedilol (COREG) 12.5 MG tablet Take 1 tablet (12.5 mg total) by mouth 2 (two) times daily. 10/10/22 03/18/89 Yes Shah, Pratik D, DO  Cholecalciferol (VITAMIN D3) 50 MCG (2000 UT) TABS Take 2,000 Units by mouth daily.   Yes [provider]  cyanocobalamin (VITAMIN B12) 1000 MCG tablet Take 1,000 mcg by mouth daily.   Yes [provider]  diltiazem (CARDIZEM CD) 180 MG 24 hr capsule Take 180 mg by mouth 2 (two) times daily.   Yes [provider]  levothyroxine (SYNTHROID, LEVOTHROID) 75 MCG tablet Take 75 mcg by mouth daily before breakfast.   Yes [provider]  lidocaine (LIDODERM) 5 % Place 1 patch onto the skin daily. Remove & Discard  patch within 12 hours or as directed by MD 12/06/23  Yes Pricilla Loveless, MD  magnesium oxide (MAG-OX) 400 (240 Mg) MG tablet Take 400 mg by mouth 2 (two) times daily.   Yes [provider]  Melatonin 10 MG TABS Take 10 mg by mouth at bedtime.   Yes [provider]  nitroGLYCERIN (NITROSTAT) 0.4 MG SL tablet Place 0.4 mg under the tongue every 5 (five) minutes as needed. For chest pains. May repeat for up to 3 doses.   Yes [provider]  omeprazole (PRILOSEC) 40 MG capsule TAKE ONE CAPSULE BY MOUTH EVERY DAY 11/12/23  Yes Marguerita Merles, Reuel Boom, MD  Probiotic Product (ALIGN) 4 MG CAPS Take 4 mg by mouth in the morning.   Yes [provider]  traZODone (DESYREL) 50 MG tablet Take 50 mg by mouth at bedtime.   Yes [provider]  Vedolizumab (ENTYVIO IV) Inject into the vein. Every 8 weeks.   Yes [provider]  warfarin (COUMADIN) 2.5 MG tablet TAKE 1 TABLET TO 1 1/2 TABLETS BY MOUTH DAILY OR AS DIRECTED BY COUMADIN CLINIC Patient taking differently: Take 2.5 mg by mouth See admin instructions. TAKE 1 TABLET TO 1 1/2 TABLETS BY MOUTH DAILY OR AS DIRECTED BY COUMADIN CLINIC 10/11/23  Yes Jonelle Sidle, MD      Allergies    Patient has no known allergies.  Review of Systems   Review of Systems  Constitutional:  Negative for fever.  Respiratory:  Positive for shortness of breath.   Cardiovascular:  Negative for chest pain and leg swelling.  Gastrointestinal:  Negative for abdominal pain.  Genitourinary:  Negative for dysuria.  Musculoskeletal:  Positive for back pain.  Neurological:  Negative for weakness and numbness.    Physical Exam Updated Vital Signs BP (!) 157/82   Pulse 78   Temp 98 F (36.7 C) (Oral)   Resp (!) 22   Ht 5\' 8"  (1.727 m)   Wt 70 kg   SpO2 97%   BMI 23.46 kg/m  Physical Exam Vitals and nursing note reviewed.  Constitutional:      General: He is not in acute distress.    Appearance: He is  well-developed. He is not ill-appearing or diaphoretic.  HENT:     Head: Normocephalic and atraumatic.  Cardiovascular:     Rate and Rhythm: Normal rate. Rhythm irregular.     Heart sounds: Normal heart sounds.  Pulmonary:     Effort: Pulmonary effort is normal.     Breath sounds: Normal breath sounds.  Abdominal:     General: There is no distension.     Palpations: Abdomen is soft.     Tenderness: There is no abdominal tenderness.  Musculoskeletal:     Thoracic back: Tenderness (mild) present.       Back:  Skin:    General: Skin is warm and dry.  Neurological:     Mental Status: He is alert.     Comments: 5/5 strength in BLE. Grossly normal sensation     ED Results / Procedures / Treatments   Labs (all labs ordered are listed, but only abnormal results are displayed) Labs Reviewed  URINALYSIS, ROUTINE W REFLEX MICROSCOPIC - Abnormal; Notable for the following components:      Result Value   Hgb urine dipstick SMALL (*)    All other components within normal limits  PROTIME-INR - Abnormal; Notable for the following components:   Prothrombin Time 24.9 (*)    INR 2.2 (*)    All other components within normal limits  COMPREHENSIVE METABOLIC PANEL - Abnormal; Notable for the following components:   Glucose, Bld 111 (*)    Creatinine, Ser 1.30 (*)    GFR, Estimated 52 (*)    All other components within normal limits  CBC WITH DIFFERENTIAL/PLATELET  LIPASE, BLOOD  TROPONIN I (HIGH SENSITIVITY)    EKG EKG Interpretation Date/Time:  Thursday December 06 2023 11:07:25 EST Ventricular Rate:  57 PR Interval:    QRS Duration:  94 QT Interval:  398 QTC Calculation: 388 R Axis:   36  Text Interpretation: Atrial fibrillation Ventricular premature complex Borderline low voltage, extremity leads overall similar to Oct 2024 Confirmed by Pricilla Loveless [id number] on 12/06/2023 12:14:34 PM  Radiology CT ABDOMEN PELVIS W CONTRAST Result Date: 12/06/2023 CLINICAL DATA:   Abdominal/flank pain.  Concern for kidney stone. EXAM: CT ABDOMEN AND PELVIS WITH CONTRAST TECHNIQUE: Multidetector CT imaging of the abdomen and pelvis was performed using the standard protocol following bolus administration of intravenous contrast. RADIATION DOSE REDUCTION: This exam was performed according to the departmental dose-optimization program which includes automated exposure control, adjustment of the mA and/or kV according to patient size and/or use of iterative reconstruction technique. CONTRAST:  OMNIPAQUE IOHEXOL 300 MG/ML  SOLN COMPARISON:  CT of the abdomen dated 12/22/2022. FINDINGS: Lower chest: The visualized lung bases are clear. There is  coronary vascular calcification. There is mild biatrial dilatation. No intra-abdominal free air or free fluid. Hepatobiliary: Subcentimeter hypodense focus in the right lobe of the liver is too small to characterize. The liver is otherwise unremarkable. No biliary dilatation. Cholecystectomy. Pancreas: Unremarkable. No pancreatic ductal dilatation or surrounding inflammatory changes. Spleen: A 2 cm splenic cyst.  The spleen is otherwise unremarkable. Adrenals/Urinary Tract: The adrenal glands unremarkable. Moderate bilateral renal parenchyma atrophy. Small bilateral renal cysts. There is no hydronephrosis on either side. There is symmetric enhancement and excretion of contrast by both kidneys. Evaluation for kidney stones is limited due to presence of intravenous contrast. The visualized ureters appear unremarkable. There is mild trabeculated appearance of the bladder wall likely related to chronic bladder outlet obstruction. Stomach/Bowel: Small hiatal hernia. There is postsurgical changes of bowel with anastomotic staple line in the right upper abdomen. There is colonic diverticulosis. No bowel obstruction or active inflammation. The appendix is normal. Vascular/Lymphatic: Moderate aortoiliac atherosclerotic disease. A 2.5 cm infrarenal aortic  ectasia. The IVC is unremarkable. No portal venous gas. There is no adenopathy. Reproductive: Postsurgical changes of TURP. Other: None Musculoskeletal: Osteopenia with degenerative changes of the spine. No acute osseous pathology. IMPRESSION: 1. No acute intra-abdominal or pelvic pathology. 2. Colonic diverticulosis. No bowel obstruction. Normal appendix. 3.  Aortic Atherosclerosis (ICD10-I70.0). Electronically Signed   By: Elgie Collard M.D.   On: 12/06/2023 12:33   DG Chest 2 View Result Date: 12/06/2023 CLINICAL DATA:  Left flank pain. EXAM: CHEST - 2 VIEW COMPARISON:  Chest radiograph dated 08/16/2023. FINDINGS: Stable cardiomediastinal silhouette. Subtle increased density at the left lung base could represent atelectasis or infiltrate. The right lung is clear. No pleural effusion or pneumothorax. No acute osseous abnormality. IMPRESSION: Subtle increased density at the left lung base could represent atelectasis or infiltrate. Electronically Signed   By: Hart Robinsons M.D.   On: 12/06/2023 09:13    Procedures Procedures    Medications Ordered in ED Medications  lidocaine (LIDODERM) 5 % 1 patch (1 patch Transdermal Patch Applied 12/06/23 1347)  iohexol (OMNIPAQUE) 300 MG/ML solution 100 mL (100 mLs Intravenous Contrast Given 12/06/23 1148)    ED Course/ Medical Decision Making/ A&P                                 Medical Decision Making Amount and/or Complexity of Data Reviewed Labs: ordered.    Details: Troponin negative Radiology: ordered and independent interpretation performed.    Details: No ureteral stone ECG/medicine tests: ordered and independent interpretation performed.    Details: Afib, no ischemia  Risk Prescription drug management.   Patient's back pain is probably muscular. Seems to hurt more with certain movements/positions.  However, he has an extensive past medical history including CAD, A-fib, diastolic heart failure, chronically on warfarin, and other  comorbidities.  Chest x-ray shows questionable infiltrate but the lower lung fields on the CT does not show any obvious pneumonia.  He does not have a cough.  He did have some worsening of his symptoms about 4 hours prior to arrival but with negative troponin and symptoms essentially ongoing for weeks, I highly doubt ACS and do not think a second troponin is necessary.  The pain is reproducible and was worse when he laid flat on the CT scanner.  CT is reassuring.  Urine with some hematuria but no signs of a UTI.  He is feeling well right now.  Will give a  lighted Derm patch and prescription and have him follow-up with PCP.  Given return precautions.        Final Clinical Impression(s) / ED Diagnoses Final diagnoses:  Acute left-sided thoracic back pain    Rx / DC Orders ED Discharge Orders          Ordered    lidocaine (LIDODERM) 5 %  Every 24 hours        12/06/23 1331              Pricilla Loveless, MD 12/06/23 1426

## 2023-12-06 NOTE — ED Notes (Signed)
Pt made aware need of urine sample and given a urinal.

## 2023-12-10 ENCOUNTER — Telehealth (INDEPENDENT_AMBULATORY_CARE_PROVIDER_SITE_OTHER): Payer: Self-pay

## 2023-12-10 ENCOUNTER — Other Ambulatory Visit (INDEPENDENT_AMBULATORY_CARE_PROVIDER_SITE_OTHER): Payer: Self-pay

## 2023-12-10 DIAGNOSIS — K50012 Crohn's disease of small intestine with intestinal obstruction: Secondary | ICD-10-CM

## 2023-12-10 DIAGNOSIS — I4891 Unspecified atrial fibrillation: Secondary | ICD-10-CM

## 2023-12-10 DIAGNOSIS — Z9289 Personal history of other medical treatment: Secondary | ICD-10-CM

## 2023-12-10 DIAGNOSIS — R634 Abnormal weight loss: Secondary | ICD-10-CM

## 2023-12-10 DIAGNOSIS — K56699 Other intestinal obstruction unspecified as to partial versus complete obstruction: Secondary | ICD-10-CM

## 2023-12-10 DIAGNOSIS — A0472 Enterocolitis due to Clostridium difficile, not specified as recurrent: Secondary | ICD-10-CM

## 2023-12-10 DIAGNOSIS — I5032 Chronic diastolic (congestive) heart failure: Secondary | ICD-10-CM

## 2023-12-10 NOTE — Telephone Encounter (Signed)
 I spoke with the patient and made him aware of the situation. I advised that they must have billed the patient for the medication and they will investigate at [facility]. (As per instant message with the [facility] infusion clinic).

## 2023-12-10 NOTE — Telephone Encounter (Signed)
 There is no need to perform CT of the abdomen and pelvis with IV contrast.  Please send him an order for chromogranin A level. [name], please cancel his CT in March. [name], please update his recall for CT abdomen and pelvis with IV contrast in 1 year.

## 2023-12-10 NOTE — Telephone Encounter (Signed)
 I have reached out to the [facility], and they state the patient may have gotten billed for the drug, as they did not know that the patient was on the patient assistance program. They will investigate and get back to me with a solution to this billing error.    Patient calling because he received a bill from [facility] for [phone] for the [place]. Even though he is supposed to receive free drug through [employer] see 10/22/2023 Letter from [employer]  stating approved for free drug through them.

## 2023-12-10 NOTE — Telephone Encounter (Signed)
 Pt does not have any scheduled procedures in March

## 2023-12-10 NOTE — Telephone Encounter (Signed)
 I spoke with the patient and made him aware he did not need to proceed with the ct scan we ordered. I made him aware he will need labs drawn at [facility] . Patient states understanding and will have his lab drawn some time this week at [facility].

## 2023-12-10 NOTE — Telephone Encounter (Signed)
 Patient called today to see if he still needs the CT scan you had ordered for him coming up in March. He says he had one on 12/11/2023, (in Epic)while at the hospital. Please advise.

## 2023-12-10 NOTE — Telephone Encounter (Signed)
 1 yr CT noted in recall

## 2023-12-11 DIAGNOSIS — K50012 Crohn's disease of small intestine with intestinal obstruction: Secondary | ICD-10-CM | POA: Diagnosis not present

## 2023-12-11 DIAGNOSIS — Z9289 Personal history of other medical treatment: Secondary | ICD-10-CM | POA: Diagnosis not present

## 2023-12-11 DIAGNOSIS — A0472 Enterocolitis due to Clostridium difficile, not specified as recurrent: Secondary | ICD-10-CM | POA: Diagnosis not present

## 2023-12-11 DIAGNOSIS — R634 Abnormal weight loss: Secondary | ICD-10-CM | POA: Diagnosis not present

## 2023-12-11 DIAGNOSIS — I5032 Chronic diastolic (congestive) heart failure: Secondary | ICD-10-CM | POA: Diagnosis not present

## 2023-12-11 DIAGNOSIS — I4891 Unspecified atrial fibrillation: Secondary | ICD-10-CM | POA: Diagnosis not present

## 2023-12-11 DIAGNOSIS — K56699 Other intestinal obstruction unspecified as to partial versus complete obstruction: Secondary | ICD-10-CM | POA: Diagnosis not present

## 2023-12-12 NOTE — Telephone Encounter (Signed)
 I reached out to [employer] regarding if there was any news regarding having this patient's bill corrected?  Per Selena Batten, we are working on this. Charmayne Sheer will f/u with you once we have a response from billing.

## 2023-12-13 LAB — CHROMOGRANIN A: Chromogranin A (ng/mL): 932.1 ng/mL — ABNORMAL HIGH (ref 0.0–101.8)

## 2023-12-13 NOTE — Telephone Encounter (Signed)
 I made The patient aware that the infusion clinic was working on resolving this billing error.

## 2023-12-14 ENCOUNTER — Encounter (INDEPENDENT_AMBULATORY_CARE_PROVIDER_SITE_OTHER): Payer: Self-pay

## 2023-12-14 DIAGNOSIS — I10 Essential (primary) hypertension: Secondary | ICD-10-CM | POA: Diagnosis not present

## 2023-12-14 DIAGNOSIS — I482 Chronic atrial fibrillation, unspecified: Secondary | ICD-10-CM | POA: Diagnosis not present

## 2023-12-14 DIAGNOSIS — M545 Low back pain, unspecified: Secondary | ICD-10-CM | POA: Diagnosis not present

## 2023-12-14 DIAGNOSIS — E039 Hypothyroidism, unspecified: Secondary | ICD-10-CM | POA: Diagnosis not present

## 2023-12-14 DIAGNOSIS — G4700 Insomnia, unspecified: Secondary | ICD-10-CM | POA: Diagnosis not present

## 2023-12-14 DIAGNOSIS — E782 Mixed hyperlipidemia: Secondary | ICD-10-CM | POA: Diagnosis not present

## 2023-12-18 DIAGNOSIS — M545 Low back pain, unspecified: Secondary | ICD-10-CM | POA: Diagnosis not present

## 2023-12-21 NOTE — Telephone Encounter (Signed)
 I spoke with [name] patient's wife made her aware the billing issue should be resolved per [facility] at the infusion clinic and also per [facility] the patient's shipment of his patient assistance [name] has already been received by them. I told [name] to check with them again, about the billing issue to make sure all was resolved when the patient goes for his next infusion. [name] states understanding.

## 2023-12-21 NOTE — Telephone Encounter (Signed)
 I spoke with [place] at the infusion clinic and asked if they have gotten the billing mistake taken care of for this patient. She said Yes, It has been sent to billing and they should be working on it, if it's not already fixed. His delivery is now set up and they have already sent over his next dose.

## 2023-12-24 ENCOUNTER — Emergency Department (HOSPITAL_COMMUNITY)
Admission: EM | Admit: 2023-12-24 | Discharge: 2023-12-24 | Disposition: A | Discharge status: Home / Self Care | Attending: Emergency Medicine | Admitting: Emergency Medicine

## 2023-12-24 ENCOUNTER — Other Ambulatory Visit: Payer: Self-pay

## 2023-12-24 ENCOUNTER — Emergency Department (HOSPITAL_COMMUNITY)

## 2023-12-24 ENCOUNTER — Encounter (HOSPITAL_COMMUNITY): Payer: Self-pay

## 2023-12-24 DIAGNOSIS — I4891 Unspecified atrial fibrillation: Secondary | ICD-10-CM | POA: Diagnosis not present

## 2023-12-24 DIAGNOSIS — R2689 Other abnormalities of gait and mobility: Secondary | ICD-10-CM | POA: Diagnosis not present

## 2023-12-24 DIAGNOSIS — I493 Ventricular premature depolarization: Secondary | ICD-10-CM | POA: Diagnosis not present

## 2023-12-24 DIAGNOSIS — M791 Myalgia, unspecified site: Secondary | ICD-10-CM | POA: Insufficient documentation

## 2023-12-24 DIAGNOSIS — R531 Weakness: Secondary | ICD-10-CM | POA: Diagnosis not present

## 2023-12-24 DIAGNOSIS — R42 Dizziness and giddiness: Secondary | ICD-10-CM | POA: Diagnosis not present

## 2023-12-24 DIAGNOSIS — R519 Headache, unspecified: Secondary | ICD-10-CM | POA: Diagnosis not present

## 2023-12-24 DIAGNOSIS — I672 Cerebral atherosclerosis: Secondary | ICD-10-CM | POA: Diagnosis not present

## 2023-12-24 DIAGNOSIS — G8929 Other chronic pain: Secondary | ICD-10-CM | POA: Insufficient documentation

## 2023-12-24 DIAGNOSIS — M25552 Pain in left hip: Secondary | ICD-10-CM | POA: Insufficient documentation

## 2023-12-24 DIAGNOSIS — I6523 Occlusion and stenosis of bilateral carotid arteries: Secondary | ICD-10-CM | POA: Diagnosis not present

## 2023-12-24 LAB — CBC WITH DIFFERENTIAL/PLATELET
Abs Immature Granulocytes: 0.05 10*3/uL (ref 0.00–0.07)
Basophils Absolute: 0 10*3/uL (ref 0.0–0.1)
Basophils Relative: 0 %
Eosinophils Absolute: 0.2 10*3/uL (ref 0.0–0.5)
Eosinophils Relative: 2 %
HCT: 43.7 % (ref 39.0–52.0)
Hemoglobin: 14.5 g/dL (ref 13.0–17.0)
Immature Granulocytes: 1 %
Lymphocytes Relative: 19 %
Lymphs Abs: 1.9 10*3/uL (ref 0.7–4.0)
MCH: 29.7 pg (ref 26.0–34.0)
MCHC: 33.2 g/dL (ref 30.0–36.0)
MCV: 89.5 fL (ref 80.0–100.0)
Monocytes Absolute: 0.7 10*3/uL (ref 0.1–1.0)
Monocytes Relative: 8 %
Neutro Abs: 7 10*3/uL (ref 1.7–7.7)
Neutrophils Relative %: 70 %
Platelets: 182 10*3/uL (ref 150–400)
RBC: 4.88 MIL/uL (ref 4.22–5.81)
RDW: 13.8 % (ref 11.5–15.5)
WBC: 9.9 10*3/uL (ref 4.0–10.5)
nRBC: 0 % (ref 0.0–0.2)

## 2023-12-24 LAB — COMPREHENSIVE METABOLIC PANEL
ALT: 19 U/L (ref 0–44)
AST: 19 U/L (ref 15–41)
Albumin: 3.6 g/dL (ref 3.5–5.0)
Alkaline Phosphatase: 61 U/L (ref 38–126)
Anion gap: 11 (ref 5–15)
BUN: 17 mg/dL (ref 8–23)
CO2: 26 mmol/L (ref 22–32)
Calcium: 9.2 mg/dL (ref 8.9–10.3)
Chloride: 100 mmol/L (ref 98–111)
Creatinine, Ser: 1.24 mg/dL (ref 0.61–1.24)
GFR, Estimated: 55 mL/min — ABNORMAL LOW (ref >60)
Glucose, Bld: 119 mg/dL — ABNORMAL HIGH (ref 70–99)
Potassium: 3.8 mmol/L (ref 3.5–5.1)
Sodium: 137 mmol/L (ref 135–145)
Total Bilirubin: 1 mg/dL (ref 0.0–1.2)
Total Protein: 6.6 g/dL (ref 6.5–8.1)

## 2023-12-24 LAB — PROTIME-INR
INR: 2.2 — ABNORMAL HIGH (ref 0.8–1.2)
Prothrombin Time: 25 s — ABNORMAL HIGH (ref 11.4–15.2)

## 2023-12-24 LAB — BRAIN NATRIURETIC PEPTIDE: B Natriuretic Peptide: 67 pg/mL (ref 0.0–100.0)

## 2023-12-24 LAB — RESP PANEL BY RT-PCR (RSV, FLU A&B, COVID)  RVPGX2
Influenza A by PCR: NEGATIVE
Influenza B by PCR: NEGATIVE
Resp Syncytial Virus by PCR: NEGATIVE
SARS Coronavirus 2 by RT PCR: NEGATIVE

## 2023-12-24 MED ORDER — IOHEXOL 350 MG/ML SOLN
75.0000 mL | Freq: Once | INTRAVENOUS | Status: AC | PRN
  Administered 2023-12-24: 75 mL via INTRAVENOUS

## 2023-12-24 MED ORDER — SODIUM CHLORIDE 0.9 % IV BOLUS
500.0000 mL | Freq: Once | INTRAVENOUS | Status: AC
  Administered 2023-12-24: 500 mL via INTRAVENOUS

## 2023-12-24 MED ORDER — ACETAMINOPHEN 500 MG PO TABS
1000.0000 mg | ORAL_TABLET | Freq: Once | ORAL | Status: AC
  Administered 2023-12-24: 1000 mg via ORAL
  Filled 2023-12-24: qty 2

## 2023-12-24 NOTE — ED Notes (Signed)
 Patient transported to MRI

## 2023-12-24 NOTE — ED Provider Notes (Signed)
 Patient's CTA does not show any obvious stenosis or significant abnormality.  He is feeling a lot better after the fluid bolus that have been given and is now ambulating without difficulty with a cane which is his baseline.  He feels well enough for discharge.  Given the improvement with a 500 cc bag of IV fluid I highly doubt that he had stroke.  Discharged home with return precautions.   Pricilla Loveless, MD 12/24/23 [phone]

## 2023-12-24 NOTE — ED Triage Notes (Signed)
 Pt arrived via POV c/o generalized body aches, headache, and reports "feeling off balance." Pt reports taking 2 Tylenol this morning PTA w/o improvement. Pt concerned he may be dehydrated.

## 2023-12-24 NOTE — ED Provider Notes (Signed)
 [place] EMERGENCY DEPARTMENT AT [facility] Provider Note   CSN: [id number] Arrival date & time: 12/24/23  1324     History {Add pertinent medical, surgical, social history, OB history to HPI:1} Chief Complaint  Patient presents with   Generalized Body Aches    [name] is a 89 y.o. male.  HPI Patient presents with concern of gait difficulty, unsteadiness.  Patient also has generalized weakness, but no focality.  Onset of this illness was about 4 days ago.  2 days prior he did receive a injection into his left hip due to chronic pain.  He has had multiple prior injections, notes no new pain in that area, though no resolution of his pain either.     Home Medications Prior to Admission medications   Medication Sig Start Date End Date Taking? Authorizing Provider  acetaminophen (TYLENOL) 500 MG tablet Take 1,000 mg by mouth every 6 (six) hours as needed for mild pain (pain score 1-3) or moderate pain (pain score 4-6).    [provider]  CALCIUM PO Take 300 mg by mouth in the morning and at bedtime.    [provider]  carvedilol (COREG) 12.5 MG tablet Take 1 tablet (12.5 mg total) by mouth 2 (two) times daily. 10/10/22 03/18/89  Sherryll Burger, Pratik D, DO  Cholecalciferol (VITAMIN D3) 50 MCG (2000 UT) TABS Take 2,000 Units by mouth daily.    [provider]  cyanocobalamin (VITAMIN B12) 1000 MCG tablet Take 1,000 mcg by mouth daily.    [provider]  diltiazem (CARDIZEM CD) 180 MG 24 hr capsule Take 180 mg by mouth 2 (two) times daily.    [provider]  levothyroxine (SYNTHROID, LEVOTHROID) 75 MCG tablet Take 75 mcg by mouth daily before breakfast.    [provider]  lidocaine (LIDODERM) 5 % Place 1 patch onto the skin daily. Remove & Discard patch within 12 hours or as directed by MD 12/06/23   Pricilla Loveless, MD  magnesium oxide (MAG-OX) 400 (240 Mg) MG tablet Take 400 mg by mouth 2 (two) times daily.    [provider]  Melatonin 10 MG TABS Take 10 mg by mouth at bedtime.    [provider]  nitroGLYCERIN (NITROSTAT) 0.4 MG SL tablet Place 0.4 mg under the tongue every 5 (five) minutes as needed. For chest pains. May repeat for up to 3 doses.    [provider]  omeprazole (PRILOSEC) 40 MG capsule TAKE ONE CAPSULE BY MOUTH EVERY DAY 11/12/23   Marguerita Merles, Reuel Boom, MD  Probiotic Product (ALIGN) 4 MG CAPS Take 4 mg by mouth in the morning.    [provider]  traZODone (DESYREL) 50 MG tablet Take 50 mg by mouth at bedtime.    [provider]  Vedolizumab (ENTYVIO IV) Inject into the vein. Every 8 weeks.    [provider]  warfarin (COUMADIN) 2.5 MG tablet TAKE 1 TABLET TO 1 1/2 TABLETS BY MOUTH DAILY OR AS DIRECTED BY COUMADIN CLINIC Patient taking differently: Take 2.5 mg by mouth See admin instructions. TAKE 1 TABLET TO 1 1/2 TABLETS BY MOUTH DAILY OR AS DIRECTED BY COUMADIN CLINIC 10/11/23   Jonelle Sidle, MD      Allergies    Patient has no known allergies.    Review of Systems   Review of Systems  Physical Exam Updated Vital Signs BP (!) 138/94 (BP Location: Right Arm)   Pulse 94   Temp 99.2 F (37.3  C) (Temporal)   Resp 20   Ht 5\' 8"  (1.727 m)   Wt 70 kg   SpO2 100%   BMI 23.46 kg/m  Physical Exam Vitals and nursing note reviewed.  Constitutional:      General: He is not in acute distress.    Appearance: He is well-developed.  HENT:     Head: Normocephalic and atraumatic.  Eyes:     Conjunctiva/sclera: Conjunctivae normal.  Cardiovascular:     Rate and Rhythm: Normal rate and regular rhythm.  Pulmonary:     Effort: Pulmonary effort is normal. No respiratory distress.     Breath sounds: No stridor.  Abdominal:     General: There is no distension.  Skin:    General: Skin is warm and dry.  Neurological:     Mental Status: He is alert and oriented to person, place, and time.     Comments: Age-appropriate atrophy.   Strength 4/5 bilateral lower extremities, upper extremities symmetrically. Speech is clear, direct, face is symmetric.  Patient states that he is unable to stand secondary to dizziness     ED Results / Procedures / Treatments   Labs (all labs ordered are listed, but only abnormal results are displayed) Labs Reviewed  PROTIME-INR  CBC WITH DIFFERENTIAL/PLATELET  COMPREHENSIVE METABOLIC PANEL    EKG None  Radiology No results found.  Procedures Procedures  {Document cardiac monitor, telemetry assessment procedure when appropriate:1}  Medications Ordered in ED Medications  sodium chloride 0.9 % bolus 500 mL (has no administration in time range)    ED Course/ Medical Decision Making/ A&P   {   Click here for ABCD2, HEART and other calculatorsREFRESH Note before signing :1}                              Medical Decision Making Elderly male with multiple medical problems including A-fib, chronic left hip pain presents with weakness, gait difficulty, fatigue.  Broad differential including infection, stroke, dehydration. Cardiac 95 irregular.  Pulse ox 100% room air normal  Amount and/or Complexity of Data Reviewed Independent Historian: spouse External Data Reviewed: notes. Labs: ordered. Decision-making details documented in ED Course. Radiology: ordered and independent interpretation performed. Decision-making details documented in ED Course. ECG/medicine tests: ordered and independent interpretation performed. Decision-making details documented in ED Course.  Risk Prescription drug management. Decision regarding hospitalization. Diagnosis or treatment significantly limited by social determinants of health.   ***  {Document critical care time when appropriate:1} {Document review of labs and clinical decision tools ie heart score, Chads2Vasc2 etc:1}  {Document your independent review of radiology images, and any outside records:1} {Document your discussion with  family members, caretakers, and with consultants:1} {Document social determinants of health affecting pt's care:1} {Document your decision making why or why not admission, treatments were needed:1} Final Clinical Impression(s) / ED Diagnoses Final diagnoses:  None    Rx / DC Orders ED Discharge Orders     None

## 2023-12-24 NOTE — Discharge Instructions (Addendum)
 Be sure to increase your fluid intake at home.  Follow-up closely with your primary care physician.  If you develop new or worsening dizziness, weakness, or any other new/concerning symptoms then return to the ER.

## 2023-12-24 NOTE — ED Notes (Signed)
 Patient Alert and oriented to baseline. Stable and ambulatory to baseline. Patient verbalized understanding of the discharge instructions.  Patient belongings were taken by the patient.

## 2023-12-24 NOTE — ED Notes (Signed)
 Not able to be transported at this tme

## 2023-12-27 ENCOUNTER — Telehealth (INDEPENDENT_AMBULATORY_CARE_PROVIDER_SITE_OTHER): Payer: Self-pay | Admitting: Gastroenterology

## 2023-12-27 NOTE — Telephone Encounter (Signed)
 Room 3, clearance to hold warfarin Thanks

## 2023-12-27 NOTE — Telephone Encounter (Signed)
 Who is your primary care physician: Fara Chute  Are you diabetic? If yes, Type 1 or Type 2?    no  Do you have a prosthetic or mechanical heart valve? no  Do you have a pacemaker/defibrillator?   no  Have you had endocarditis/atrial fibrillation? yes  Have you had joint replacement within the last 12 months?  no  Do you tend to be constipated or have to use laxatives? no  Do you have any history of drugs or alchohol?  no  Do you use supplemental oxygen?  no  Have you had a stroke or heart attack within the last 6 months? no  Do you take weight loss medication?  no     Do you take any blood-thinning medications such as: (aspirin, warfarin, Plavix, Aggrenox)  yes  If yes we need the name, milligram, dosage and who is prescribing doctor Warfarin 2.5mg  Current Outpatient Medications on File Prior to Visit  Medication Sig Dispense Refill   acetaminophen (TYLENOL) 500 MG tablet Take 1,000 mg by mouth every 6 (six) hours as needed for mild pain (pain score 1-3) or moderate pain (pain score 4-6).     CALCIUM PO Take 300 mg by mouth in the morning and at bedtime.     carvedilol (COREG) 12.5 MG tablet Take 1 tablet (12.5 mg total) by mouth 2 (two) times daily. 60 tablet 0   Cholecalciferol (VITAMIN D3) 50 MCG (2000 UT) TABS Take 2,000 Units by mouth daily.     cyanocobalamin (VITAMIN B12) 1000 MCG tablet Take 1,000 mcg by mouth daily.     diltiazem (CARDIZEM CD) 180 MG 24 hr capsule Take 180 mg by mouth 2 (two) times daily.     levothyroxine (SYNTHROID, LEVOTHROID) 75 MCG tablet Take 75 mcg by mouth daily before breakfast.     lidocaine (LIDODERM) 5 % Place 1 patch onto the skin daily. Remove & Discard patch within 12 hours or as directed by MD 5 patch 0   magnesium oxide (MAG-OX) 400 (240 Mg) MG tablet Take 400 mg by mouth 2 (two) times daily.     Melatonin 10 MG TABS Take 10 mg by mouth at bedtime.     nitroGLYCERIN (NITROSTAT) 0.4 MG SL tablet Place 0.4 mg under the tongue every 5  (five) minutes as needed. For chest pains. May repeat for up to 3 doses.     omeprazole (PRILOSEC) 40 MG capsule TAKE ONE CAPSULE BY MOUTH EVERY DAY 90 capsule 1   Probiotic Product (ALIGN) 4 MG CAPS Take 4 mg by mouth in the morning.     traZODone (DESYREL) 50 MG tablet Take 50 mg by mouth at bedtime.     Vedolizumab (ENTYVIO IV) Inject into the vein. Every 8 weeks.     warfarin (COUMADIN) 2.5 MG tablet TAKE 1 TABLET TO 1 1/2 TABLETS BY MOUTH DAILY OR AS DIRECTED BY COUMADIN CLINIC (Patient taking differently: Take 2.5 mg by mouth See admin instructions. TAKE 1 TABLET TO 1 1/2 TABLETS BY MOUTH DAILY OR AS DIRECTED BY COUMADIN CLINIC) 45 tablet 5   No current facility-administered medications on file prior to visit.    No Known Allergies   Pharmacy:   Primary Insurance Name: [employer]  Best number where you can be reached: [phone]

## 2023-12-28 DIAGNOSIS — M25532 Pain in left wrist: Secondary | ICD-10-CM | POA: Diagnosis not present

## 2023-12-28 DIAGNOSIS — Z6823 Body mass index (BMI) 23.0-23.9, adult: Secondary | ICD-10-CM | POA: Diagnosis not present

## 2023-12-28 DIAGNOSIS — M8580 Other specified disorders of bone density and structure, unspecified site: Secondary | ICD-10-CM | POA: Diagnosis not present

## 2023-12-31 ENCOUNTER — Telehealth (INDEPENDENT_AMBULATORY_CARE_PROVIDER_SITE_OTHER): Payer: Self-pay | Admitting: Gastroenterology

## 2023-12-31 DIAGNOSIS — M47896 Other spondylosis, lumbar region: Secondary | ICD-10-CM | POA: Diagnosis not present

## 2023-12-31 DIAGNOSIS — M545 Low back pain, unspecified: Secondary | ICD-10-CM | POA: Diagnosis not present

## 2023-12-31 NOTE — Telephone Encounter (Signed)
    12/31/23  [name] 11/30/1930  What type of surgery is being performed? EGD   When is surgery scheduled? TBD  What type of clearance is required (medical or pharmacy to hold medication or both? Pharmacy to hold med  Are there any medications that need to be held prior to surgery and how long? Clearance to hold Warfarin for 5 days prior   Name of physician performing surgery?  Dr. Katrinka Blazing [facility] Phone: [phone] Fax: [phone]  Anethesia type (none, local, MAC, general)? Choice

## 2023-12-31 NOTE — Telephone Encounter (Signed)
Clearance sent to cardiology pool

## 2024-01-01 ENCOUNTER — Encounter: Payer: PPO | Attending: Gastroenterology | Admitting: Internal Medicine

## 2024-01-01 VITALS — BP 149/70 | HR 56 | Temp 97.9°F | Resp 14

## 2024-01-01 DIAGNOSIS — K50018 Crohn's disease of small intestine with other complication: Secondary | ICD-10-CM | POA: Diagnosis not present

## 2024-01-01 MED ORDER — VEDOLIZUMAB 300 MG IV SOLR
300.0000 mg | Freq: Once | INTRAVENOUS | Status: AC
  Administered 2024-01-01: 300 mg via INTRAVENOUS
  Filled 2024-01-01: qty 5

## 2024-01-01 NOTE — Telephone Encounter (Signed)
 Patient with diagnosis of atrial fibrillation on warfarin for anticoagulation.    What type of surgery is being performed? EGD    When is surgery scheduled? TBD   CHA2DS2-VASc Score = 3   This indicates a 3.2% annual risk of stroke. The patient's score is based upon: CHF History: 1 HTN History: 0 Diabetes History: 0 Stroke History: 0 Vascular Disease History: 0 Age Score: 2 Gender Score: 0   CrCl 39 Platelet count 182  Per office protocol, patient can hold warfarin for 5 days prior to procedure.   Patient will not need bridging with Lovenox (enoxaparin) around procedure.  **This guidance is not considered finalized until pre-operative APP has relayed final recommendations.**

## 2024-01-01 NOTE — Telephone Encounter (Signed)
     Primary Cardiologist: Nona Dell, MD  Clinical pharmacist have reviewed chart as part of pre-operative protocol coverage.  The following recommendations have been provided for , [name] :  CHA2DS2-VASc Score = 3   This indicates a 3.2% annual risk of stroke. The patient's score is based upon: CHF History: 1 HTN History: 0 Diabetes History: 0 Stroke History: 0 Vascular Disease History: 0 Age Score: 2 Gender Score: 0   CrCl 39 Platelet count 182   Per office protocol, patient can hold warfarin for 5 days prior to procedure.   Patient will not need bridging with Lovenox (enoxaparin) around procedure.  I will route this recommendation to the requesting party via Epic fax function and remove from pre-op pool.  Please call with questions.  Thomasene Ripple. [name] NP-C     01/01/2024, 10:31 AM [facility] [address] Office [phone] Fax [phone]

## 2024-01-01 NOTE — Progress Notes (Signed)
 Diagnosis: Crohn's Disease  Provider:  Doylene Bode NP  Procedure: IV Infusion  IV Type: Peripheral, IV Location: L Antecubital  Entyvio (Vedolizumab), Dose: 300 mg  Infusion Start Time: 1332  Infusion Stop Time: 1409  Post Infusion IV Care: Observation period completed  Discharge: Condition: Good, Destination: Home . AVS Provided  Performed by:  Cleotilde Neer, LPN

## 2024-01-02 ENCOUNTER — Ambulatory Visit: Attending: Cardiology | Admitting: *Deleted

## 2024-01-02 DIAGNOSIS — Z5181 Encounter for therapeutic drug level monitoring: Secondary | ICD-10-CM

## 2024-01-02 DIAGNOSIS — I4891 Unspecified atrial fibrillation: Secondary | ICD-10-CM | POA: Diagnosis not present

## 2024-01-02 LAB — POCT INR: INR: 2.3 (ref 2.0–3.0)

## 2024-01-02 NOTE — Telephone Encounter (Signed)
 Clearance received. Called pt and spoke with pt and wife. Pt and wife state May be better. Will call pt back with May schedule.

## 2024-01-02 NOTE — Patient Instructions (Signed)
 Continue warfarin 1 tablet daily except 1 1/2 tablets on Mondays Recheck 4 weeks

## 2024-01-08 DIAGNOSIS — R262 Difficulty in walking, not elsewhere classified: Secondary | ICD-10-CM | POA: Diagnosis not present

## 2024-01-08 DIAGNOSIS — M545 Low back pain, unspecified: Secondary | ICD-10-CM | POA: Diagnosis not present

## 2024-01-08 DIAGNOSIS — M6281 Muscle weakness (generalized): Secondary | ICD-10-CM | POA: Diagnosis not present

## 2024-01-10 DIAGNOSIS — M545 Low back pain, unspecified: Secondary | ICD-10-CM | POA: Diagnosis not present

## 2024-01-10 DIAGNOSIS — R262 Difficulty in walking, not elsewhere classified: Secondary | ICD-10-CM | POA: Diagnosis not present

## 2024-01-10 DIAGNOSIS — M6281 Muscle weakness (generalized): Secondary | ICD-10-CM | POA: Diagnosis not present

## 2024-01-14 DIAGNOSIS — E039 Hypothyroidism, unspecified: Secondary | ICD-10-CM | POA: Diagnosis not present

## 2024-01-14 DIAGNOSIS — I482 Chronic atrial fibrillation, unspecified: Secondary | ICD-10-CM | POA: Diagnosis not present

## 2024-01-14 DIAGNOSIS — G4700 Insomnia, unspecified: Secondary | ICD-10-CM | POA: Diagnosis not present

## 2024-01-14 DIAGNOSIS — I10 Essential (primary) hypertension: Secondary | ICD-10-CM | POA: Diagnosis not present

## 2024-01-14 DIAGNOSIS — E782 Mixed hyperlipidemia: Secondary | ICD-10-CM | POA: Diagnosis not present

## 2024-01-14 DIAGNOSIS — M545 Low back pain, unspecified: Secondary | ICD-10-CM | POA: Diagnosis not present

## 2024-01-15 DIAGNOSIS — R262 Difficulty in walking, not elsewhere classified: Secondary | ICD-10-CM | POA: Diagnosis not present

## 2024-01-15 DIAGNOSIS — M6281 Muscle weakness (generalized): Secondary | ICD-10-CM | POA: Diagnosis not present

## 2024-01-15 DIAGNOSIS — M545 Low back pain, unspecified: Secondary | ICD-10-CM | POA: Diagnosis not present

## 2024-01-17 DIAGNOSIS — M545 Low back pain, unspecified: Secondary | ICD-10-CM | POA: Diagnosis not present

## 2024-01-17 DIAGNOSIS — R262 Difficulty in walking, not elsewhere classified: Secondary | ICD-10-CM | POA: Diagnosis not present

## 2024-01-17 DIAGNOSIS — M6281 Muscle weakness (generalized): Secondary | ICD-10-CM | POA: Diagnosis not present

## 2024-01-22 DIAGNOSIS — R262 Difficulty in walking, not elsewhere classified: Secondary | ICD-10-CM | POA: Diagnosis not present

## 2024-01-22 DIAGNOSIS — M6281 Muscle weakness (generalized): Secondary | ICD-10-CM | POA: Diagnosis not present

## 2024-01-22 DIAGNOSIS — M545 Low back pain, unspecified: Secondary | ICD-10-CM | POA: Diagnosis not present

## 2024-01-29 DIAGNOSIS — E7800 Pure hypercholesterolemia, unspecified: Secondary | ICD-10-CM | POA: Diagnosis not present

## 2024-01-29 DIAGNOSIS — M6281 Muscle weakness (generalized): Secondary | ICD-10-CM | POA: Diagnosis not present

## 2024-01-29 DIAGNOSIS — K5090 Crohn's disease, unspecified, without complications: Secondary | ICD-10-CM | POA: Diagnosis not present

## 2024-01-29 DIAGNOSIS — E7849 Other hyperlipidemia: Secondary | ICD-10-CM | POA: Diagnosis not present

## 2024-01-29 DIAGNOSIS — M545 Low back pain, unspecified: Secondary | ICD-10-CM | POA: Diagnosis not present

## 2024-01-29 DIAGNOSIS — R262 Difficulty in walking, not elsewhere classified: Secondary | ICD-10-CM | POA: Diagnosis not present

## 2024-01-29 DIAGNOSIS — E782 Mixed hyperlipidemia: Secondary | ICD-10-CM | POA: Diagnosis not present

## 2024-01-29 DIAGNOSIS — E039 Hypothyroidism, unspecified: Secondary | ICD-10-CM | POA: Diagnosis not present

## 2024-01-29 LAB — LAB REPORT - SCANNED
EGFR: 44.8
TSH: 2.92 (ref 0.41–5.90)

## 2024-01-30 ENCOUNTER — Ambulatory Visit: Attending: Cardiology | Admitting: *Deleted

## 2024-01-30 DIAGNOSIS — I4891 Unspecified atrial fibrillation: Secondary | ICD-10-CM

## 2024-01-30 DIAGNOSIS — Z5181 Encounter for therapeutic drug level monitoring: Secondary | ICD-10-CM

## 2024-01-30 LAB — POCT INR: INR: 2.2 (ref 2.0–3.0)

## 2024-01-30 NOTE — Patient Instructions (Signed)
 Continue warfarin 1 tablet daily except 1 1/2 tablets on Mondays Recheck 4 weeks

## 2024-01-31 DIAGNOSIS — M6281 Muscle weakness (generalized): Secondary | ICD-10-CM | POA: Diagnosis not present

## 2024-01-31 DIAGNOSIS — R262 Difficulty in walking, not elsewhere classified: Secondary | ICD-10-CM | POA: Diagnosis not present

## 2024-01-31 DIAGNOSIS — M545 Low back pain, unspecified: Secondary | ICD-10-CM | POA: Diagnosis not present

## 2024-01-31 NOTE — Telephone Encounter (Signed)
 Questionnaire from recall, no referral needed

## 2024-01-31 NOTE — Telephone Encounter (Signed)
 Pt contacted and scheduled. Instructions will be mailed to pt.

## 2024-02-04 DIAGNOSIS — M79672 Pain in left foot: Secondary | ICD-10-CM | POA: Diagnosis not present

## 2024-02-04 DIAGNOSIS — M79671 Pain in right foot: Secondary | ICD-10-CM | POA: Diagnosis not present

## 2024-02-04 DIAGNOSIS — I739 Peripheral vascular disease, unspecified: Secondary | ICD-10-CM | POA: Diagnosis not present

## 2024-02-04 DIAGNOSIS — M79674 Pain in right toe(s): Secondary | ICD-10-CM | POA: Diagnosis not present

## 2024-02-04 DIAGNOSIS — L110 Acquired keratosis follicularis: Secondary | ICD-10-CM | POA: Diagnosis not present

## 2024-02-04 DIAGNOSIS — M79675 Pain in left toe(s): Secondary | ICD-10-CM | POA: Diagnosis not present

## 2024-02-05 ENCOUNTER — Telehealth: Payer: Self-pay | Admitting: Cardiology

## 2024-02-05 NOTE — Telephone Encounter (Signed)
 Spoke with pt.  He is scheduled for EGD on 02/15/24.  Has been cleared to hold warfarin 5 days before procedure.  He will take last dose of warfarin 4/26 and resume warfarin night of procedure if of with MD.  He will take extra 1/2 tablet of warfarin x 2 days the resume 1 tablet daily except 1 1/2 tablets on Mondays.  INR appt moved up to 02/20/24.  Pt verbalized understanding.

## 2024-02-05 NOTE — Telephone Encounter (Signed)
 Patient wants a call back from RN Edwina Gram regarding when he should have his next check after his procedure.

## 2024-02-06 DIAGNOSIS — K5090 Crohn's disease, unspecified, without complications: Secondary | ICD-10-CM | POA: Diagnosis not present

## 2024-02-06 DIAGNOSIS — Z6823 Body mass index (BMI) 23.0-23.9, adult: Secondary | ICD-10-CM | POA: Diagnosis not present

## 2024-02-06 DIAGNOSIS — E039 Hypothyroidism, unspecified: Secondary | ICD-10-CM | POA: Diagnosis not present

## 2024-02-06 DIAGNOSIS — E782 Mixed hyperlipidemia: Secondary | ICD-10-CM | POA: Diagnosis not present

## 2024-02-08 ENCOUNTER — Encounter: Payer: Self-pay | Admitting: Cardiology

## 2024-02-08 ENCOUNTER — Encounter: Payer: PPO | Attending: Gastroenterology | Admitting: Cardiology

## 2024-02-08 VITALS — BP 138/72 | HR 85 | Ht 68.0 in | Wt 157.2 lb

## 2024-02-08 DIAGNOSIS — I2510 Atherosclerotic heart disease of native coronary artery without angina pectoris: Secondary | ICD-10-CM | POA: Insufficient documentation

## 2024-02-08 DIAGNOSIS — I4821 Permanent atrial fibrillation: Secondary | ICD-10-CM | POA: Diagnosis not present

## 2024-02-08 NOTE — Progress Notes (Signed)
    Cardiology Office Note  Date: 02/08/2024   ID: [name], DOB August 12, 1931, MRN [id number]  History of Present Illness: [name] is a 89 y.o. male last seen in August 2024.  He is here today with his wife for a follow-up visit.  Reports no sense of palpitations, no increasing dyspnea with typical activities.  Occasionally has an atypical lateral thoracic discomfort that is nonexertional.  He is preparing to undergo surveillance EGD and will be temporarily off Coumadin .  He is on Coumadin  with follow-up in the anticoagulation clinic.  We reviewed the remainder of his medications as well.  He reports compliance with current regimen.  I reviewed his recent lab work which is noted below.  He had an ECG done in March which I also reviewed.  Physical Exam: VS:  BP 138/72   Pulse 85   Ht 5\' 8"  (1.727 m)   Wt 157 lb 3.2 oz (71.3 kg)   SpO2 99%   BMI 23.90 kg/m , BMI Body mass index is 23.9 kg/m.  Wt Readings from Last 3 Encounters:  02/08/24 157 lb 3.2 oz (71.3 kg)  12/24/23 154 lb 5.2 oz (70 kg)  12/06/23 154 lb 5.2 oz (70 kg)    General: Patient appears comfortable at rest. HEENT: Conjunctiva and lids normal. Neck: Supple, no elevated JVP or carotid bruits. Lungs: Clear to auscultation, nonlabored breathing at rest. Cardiac: Irregular irregular, 1/6 systolic murmur, no gallop.  ECG:  An ECG dated 12/24/2023 was personally reviewed today and demonstrated:  Rate controlled atrial fibrillation with ventricular couplet, nonspecific T wave changes.  Labwork: 12/24/2023: ALT 19; AST 19; B Natriuretic Peptide 67.0; BUN 17; Creatinine, Ser 1.24; Hemoglobin 14.5; Platelets 182; Potassium 3.8; Sodium 137  April 2025: Hemoglobin 13.8, platelets 162, potassium 3.9, BUN 14, creatinine 1.47, GFR 45, AST 14, ALT 6, cholesterol 175, HDL 46, triglycerides 120, LDL 105, TSH 2.921  Other Studies Reviewed Today:  No interval cardiac testing for review today.  Assessment and Plan:  1.   Permanent atrial fibrillation with CHA2DS2-VASc score of 3.  Asymptomatic with no sense of palpitations.  Continue Coreg  12.5 mg twice daily, Cardizem  CD100 and 80 mg daily, and Coumadin  with follow-up in the anticoagulation clinic.   2.  Nonobstructive CAD documented by cardiac catheterization in 2004.  Follow-up Myoview  in 2018 was low risk without active ischemia.  Echocardiogram in December 2023 revealed LVEF 50 to 55%.  He does not report any clear-cut angina, does have as needed nitroglycerin  available.  Disposition:  Follow up  6 months.  Signed, Gerard Knight, M.D., F.A.C.C. [facility]

## 2024-02-08 NOTE — Patient Instructions (Addendum)

## 2024-02-13 ENCOUNTER — Encounter (HOSPITAL_COMMUNITY)
Admission: RE | Admit: 2024-02-13 | Discharge: 2024-02-13 | Disposition: A | Discharge status: Home / Self Care | Source: Ambulatory Visit | Attending: Gastroenterology | Admitting: Gastroenterology

## 2024-02-13 DIAGNOSIS — G4700 Insomnia, unspecified: Secondary | ICD-10-CM | POA: Diagnosis not present

## 2024-02-13 DIAGNOSIS — E782 Mixed hyperlipidemia: Secondary | ICD-10-CM | POA: Diagnosis not present

## 2024-02-13 DIAGNOSIS — M545 Low back pain, unspecified: Secondary | ICD-10-CM | POA: Diagnosis not present

## 2024-02-13 DIAGNOSIS — E039 Hypothyroidism, unspecified: Secondary | ICD-10-CM | POA: Diagnosis not present

## 2024-02-13 DIAGNOSIS — I482 Chronic atrial fibrillation, unspecified: Secondary | ICD-10-CM | POA: Diagnosis not present

## 2024-02-13 DIAGNOSIS — I10 Essential (primary) hypertension: Secondary | ICD-10-CM | POA: Diagnosis not present

## 2024-02-15 ENCOUNTER — Ambulatory Visit (HOSPITAL_COMMUNITY)
Admission: RE | Admit: 2024-02-15 | Discharge: 2024-02-15 | Disposition: A | Discharge status: Home / Self Care | Attending: Gastroenterology | Admitting: Gastroenterology

## 2024-02-15 ENCOUNTER — Ambulatory Visit (HOSPITAL_COMMUNITY): Admitting: Anesthesiology

## 2024-02-15 ENCOUNTER — Encounter (HOSPITAL_COMMUNITY)
Admission: RE | Disposition: A | Discharge status: Home / Self Care | Payer: Self-pay | Source: Home / Self Care | Attending: Gastroenterology

## 2024-02-15 ENCOUNTER — Other Ambulatory Visit: Payer: Self-pay

## 2024-02-15 ENCOUNTER — Encounter (HOSPITAL_COMMUNITY): Payer: Self-pay | Admitting: Gastroenterology

## 2024-02-15 ENCOUNTER — Ambulatory Visit (HOSPITAL_BASED_OUTPATIENT_CLINIC_OR_DEPARTMENT_OTHER): Admitting: Anesthesiology

## 2024-02-15 DIAGNOSIS — K5000 Crohn's disease of small intestine without complications: Secondary | ICD-10-CM | POA: Diagnosis not present

## 2024-02-15 DIAGNOSIS — C49A3 Gastrointestinal stromal tumor of small intestine: Secondary | ICD-10-CM

## 2024-02-15 DIAGNOSIS — I2510 Atherosclerotic heart disease of native coronary artery without angina pectoris: Secondary | ICD-10-CM | POA: Diagnosis not present

## 2024-02-15 DIAGNOSIS — K219 Gastro-esophageal reflux disease without esophagitis: Secondary | ICD-10-CM | POA: Insufficient documentation

## 2024-02-15 DIAGNOSIS — K449 Diaphragmatic hernia without obstruction or gangrene: Secondary | ICD-10-CM

## 2024-02-15 DIAGNOSIS — Z79890 Hormone replacement therapy (postmenopausal): Secondary | ICD-10-CM | POA: Diagnosis not present

## 2024-02-15 DIAGNOSIS — D132 Benign neoplasm of duodenum: Secondary | ICD-10-CM

## 2024-02-15 DIAGNOSIS — E039 Hypothyroidism, unspecified: Secondary | ICD-10-CM | POA: Diagnosis not present

## 2024-02-15 DIAGNOSIS — K3189 Other diseases of stomach and duodenum: Secondary | ICD-10-CM | POA: Insufficient documentation

## 2024-02-15 DIAGNOSIS — I4891 Unspecified atrial fibrillation: Secondary | ICD-10-CM | POA: Diagnosis not present

## 2024-02-15 HISTORY — PX: ESOPHAGOGASTRODUODENOSCOPY: SHX5428

## 2024-02-15 SURGERY — EGD (ESOPHAGOGASTRODUODENOSCOPY)
Anesthesia: General

## 2024-02-15 MED ORDER — PHENYLEPHRINE 80 MCG/ML (10ML) SYRINGE FOR IV PUSH (FOR BLOOD PRESSURE SUPPORT)
PREFILLED_SYRINGE | INTRAVENOUS | Status: DC | PRN
  Administered 2024-02-15: 160 ug via INTRAVENOUS
  Administered 2024-02-15: 80 ug via INTRAVENOUS

## 2024-02-15 MED ORDER — LACTATED RINGERS IV SOLN: INTRAVENOUS | Status: DC | PRN

## 2024-02-15 MED ORDER — LIDOCAINE 2% (20 MG/ML) 5 ML SYRINGE
INTRAMUSCULAR | Status: DC | PRN
  Administered 2024-02-15: 80 mg via INTRAVENOUS

## 2024-02-15 MED ORDER — PROPOFOL 500 MG/50ML IV EMUL
INTRAVENOUS | Status: DC | PRN
  Administered 2024-02-15: 100 ug/kg/min via INTRAVENOUS

## 2024-02-15 MED ORDER — PROPOFOL 10 MG/ML IV BOLUS
INTRAVENOUS | Status: DC | PRN
  Administered 2024-02-15: 70 mg via INTRAVENOUS

## 2024-02-15 NOTE — Op Note (Signed)
 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 Estimated Blood Loss:     Estimated blood loss: none. Procedure:                Pre-Anesthesia Assessment:                           - Prior to the procedure, a History and Physical                            was performed, and patient medications, allergies                            and sensitivities were reviewed. The patient's                            tolerance of previous anesthesia was reviewed.                           - The risks and benefits of the procedure and the                            sedation options and risks were discussed with the                            patient. All questions were answered and informed                            consent was obtained.                           - ASA Grade Assessment: III - A patient with severe                            systemic disease.                           After obtaining informed consent, the endoscope was  passed under direct vision. Throughout the                            procedure, the patient's blood pressure, pulse, and                            oxygen  saturations were monitored continuously. The                            [id number]-[id number] ([id number]) scope was introduced through the                            mouth, and advanced to the second part of duodenum.                             The upper GI endoscopy was accomplished without                            difficulty. The patient tolerated the procedure                            well. Scope In: 1:59:47 PM Scope Out: 2:05:34 PM Total Procedure Duration: 0 hours 5 minutes 47 seconds  Findings:      A 2 cm hiatal hernia was present.      The stomach was normal.      A single 15 mm submucosal nodule was found in the second portion of the       duodenum. The nodule was Paris classification Is (protruding, sessile).       This is the site of previously known NET. Size appears to be similar but       surface appears to be eroded. Impression:               - 2 cm hiatal hernia.                           - Normal stomach.                           - Submucosal nodule found in the duodenum - Eroded                            NET.                           - No specimens collected. Moderate Sedation:      Per Anesthesia Care Recommendation:           - Discharge patient to home (ambulatory).                           - Resume previous diet.                           - Will discuss with patient and family in  interested in possible EMR by Dr. Brice Campi. If                            interested, will need to discuss with Dr.                            Brice Campi if he would be a reasonable candidate. Procedure Code(s):        --- Professional ---                           [phone], Esophagogastroduodenoscopy, flexible,                            transoral; diagnostic, including collection of                            specimen(s) by brushing or washing, when performed                            (separate procedure) Diagnosis Code(s):        --- Professional ---                           K44.9, Diaphragmatic hernia without obstruction or                            gangrene                           K31.89, Other diseases of stomach and duodenum                           D13.2, Benign  neoplasm of duodenum CPT copyright 2022 American Medical Association. All rights reserved. The codes documented in this report are preliminary and upon coder review may  be revised to meet current compliance requirements. Samantha Cress, MD Samantha Cress,  02/15/2024 2:16:22 PM This report has been signed electronically. Number of Addenda: 0

## 2024-02-15 NOTE — Discharge Instructions (Addendum)
 You are being discharged to home.  Resume your previous diet.  Restart coumadin  tonight.

## 2024-02-15 NOTE — Anesthesia Postprocedure Evaluation (Signed)
 Anesthesia Post Note  Patient: [name]  Procedure(s) Performed: EGD (ESOPHAGOGASTRODUODENOSCOPY)  Patient location during evaluation: Phase II Anesthesia Type: General Level of consciousness: awake and alert Pain management: pain level controlled Vital Signs Assessment: post-procedure vital signs reviewed and stable Respiratory status: spontaneous breathing, nonlabored ventilation and respiratory function stable Cardiovascular status: blood pressure returned to baseline and stable Postop Assessment: no apparent nausea or vomiting Anesthetic complications: no   There were no known notable events for this encounter.   Last Vitals:  Vitals:   02/15/24 1241 02/15/24 1412  BP: (!) 172/88 110/83  Pulse: 74 75  Resp: 16 17  Temp: 37.2 C   SpO2: 100% 97%    Last Pain:  Vitals:   02/15/24 1412  TempSrc: Oral  PainSc: 0-No pain                 Janis Cuffe L Briggette Najarian

## 2024-02-15 NOTE — Anesthesia Preprocedure Evaluation (Signed)
 Anesthesia Evaluation  Patient identified by MRN, date of birth, ID band Patient awake    Reviewed: Allergy  & Precautions, NPO status , Patient's Chart, lab work & pertinent test results, reviewed documented beta blocker date and time   Airway Mallampati: III  TM Distance: >3 FB Neck ROM: Full    Dental  (+) Dental Advisory Given, Missing   Pulmonary shortness of breath and with exertion, pneumonia, resolved   Pulmonary exam normal breath sounds clear to auscultation       Cardiovascular (-) hypertension+ CAD  Normal cardiovascular exam+ dysrhythmias Atrial Fibrillation + Valvular Problems/Murmurs MR  Rhythm:Irregular Rate:Normal  1. Left ventricular ejection fraction, by estimation, is 50 to 55%. The  left ventricle has low normal function. The left ventricle has no regional  wall motion abnormalities. Left ventricular diastolic parameters are  indeterminate.   2. Right ventricular systolic function is normal. The right ventricular  size is normal. There is normal pulmonary artery systolic pressure.   3. Large pleural effusion in the left lateral region.   4. The mitral valve is grossly normal. Mild mitral valve regurgitation.  No evidence of mitral stenosis.   5. The aortic valve is tricuspid. There is mild calcification of the  aortic valve. There is mild thickening of the aortic valve. Aortic valve  regurgitation is trivial. No aortic stenosis is present.   6. The inferior vena cava is dilated in size with >50% respiratory  variability, suggesting right atrial pressure of 8 mmHg.   Comparison(s): Large left sided pleural effusion is new.      Neuro/Psych negative neurological ROS  negative psych ROS   GI/Hepatic Neg liver ROS,GERD  Medicated,,Crohn's disease (HCC) Crohn's disease of small intestine (HCC) Duodenal carcinoid syndrome (HCC)     Endo/Other  Hypothyroidism    Renal/GU negative Renal ROS      Musculoskeletal negative musculoskeletal ROS (+)    Abdominal   Peds  Hematology  (+) Blood dyscrasia, anemia   Anesthesia Other Findings   Reproductive/Obstetrics negative OB ROS                             Anesthesia Physical Anesthesia Plan  ASA: 3  Anesthesia Plan: General   Post-op Pain Management: Minimal or no pain anticipated   Induction: Intravenous  PONV Risk Score and Plan: Propofol  infusion  Airway Management Planned: Nasal Cannula and Natural Airway  Additional Equipment:   Intra-op Plan:   Post-operative Plan:   Informed Consent: I have reviewed the patients History and Physical, chart, labs and discussed the procedure including the risks, benefits and alternatives for the proposed anesthesia with the patient or authorized representative who has indicated his/her understanding and acceptance.     Dental advisory given  Plan Discussed with: CRNA and Surgeon  Anesthesia Plan Comments:         Anesthesia Quick Evaluation

## 2024-02-15 NOTE — Anesthesia Procedure Notes (Signed)
 Date/Time: 02/15/2024 1:55 PM  Performed by: Sherwin Donate, CRNAPre-anesthesia Checklist: Patient identified, Emergency Drugs available, Suction available and Patient being monitored Patient Re-evaluated:Patient Re-evaluated prior to induction Oxygen  Delivery Method: Nasal cannula Induction Type: IV induction Placement Confirmation: positive ETCO2 Comments: Optiflow High Flow [place] O2 used.

## 2024-02-15 NOTE — Transfer of Care (Signed)
 Immediate Anesthesia Transfer of Care Note  Patient: UWE KOIS  Procedure(s) Performed: EGD (ESOPHAGOGASTRODUODENOSCOPY)  Patient Location: Short Stay  Anesthesia Type:General  Level of Consciousness: drowsy  Airway & Oxygen  Therapy: Patient Spontanous Breathing  Post-op Assessment: Report given to RN and Post -op Vital signs reviewed and stable  Post vital signs: Reviewed and stable  Last Vitals:  Vitals Value Taken Time  BP    Temp    Pulse    Resp    SpO2      Last Pain:  Vitals:   02/15/24 1356  TempSrc:   PainSc: 0-No pain      Patients Stated Pain Goal: 4 (02/15/24 1235)  Complications: No notable events documented.

## 2024-02-15 NOTE — H&P (Signed)
 [name] is an 89 y.o. male.   Chief Complaint: Duodenal neuroendocrine tumor HPI: [name] is a 89 y.o. male male with past medical history of small bowel Crohn's disease status post resection complicated by recurrent stricture , C, Stelara  induced thrombocytopenia, C difficile colitis, BPH, coronary artery disease, atrial fibrillation, GERD, hypothyroidism , who presents for follow up of Duodenal neuroendocrine tumor.  The patient denies having any nausea, vomiting, fever, chills, hematochezia, melena, hematemesis, abdominal distention, abdominal pain, diarrhea, jaundice, pruritus or weight loss.  Past Medical History:  Diagnosis Date   Anemia    Atrial fibrillation (HCC)    BPH (benign prostatic hyperplasia)    CAD (coronary artery disease)    Catheterization 2004, mild/moderate nonobstructive disease  /   nuclear, 2007, small inferior scar // no ischemia   Crohn's disease (HCC)    Elevated PSA    GERD (gastroesophageal reflux disease)    History of kidney stones    History of pneumonia    Hypothyroidism    Mitral regurgitation    Pneumonia    SBO (small bowel obstruction) (HCC)    Urinary retention    Wears glasses     Past Surgical History:  Procedure Laterality Date   AGILE CAPSULE  10/18/2011   Procedure: AGILE CAPSULE;  Surgeon: Ruby Corporal, MD;  [facility];  Service: Endoscopy;  Laterality: N/A;  730   BIOPSY  10/05/2022   Procedure: BIOPSY;  Surgeon: Urban Garden, MD;  [facility];  Service: Gastroenterology;;   BIOPSY  01/02/2023   Procedure: BIOPSY;  Surgeon: Urban Garden, MD;  [facility];  Service: Gastroenterology;;   CARDIAC CATHETERIZATION  2004   CATARACT EXTRACTION W/PHACO Right 06/27/2021   Procedure: CATARACT EXTRACTION PHACO AND INTRAOCULAR LENS PLACEMENT (IOC);  Surgeon: Tarri Farm, MD;  Location: [facility];  Service: Ophthalmology;  Laterality: Right;  CDE 21.98   CATARACT EXTRACTION  W/PHACO Left 07/11/2021   Procedure: CATARACT EXTRACTION PHACO AND INTRAOCULAR LENS PLACEMENT LEFT EYE;  Surgeon: Tarri Farm, MD;  Location: [facility];  Service: Ophthalmology;  Laterality: Left;  CDE=10.42   CHOLECYSTECTOMY  2010   Dr. DeMason   COLONOSCOPY  2008   DeMason   COLONOSCOPY N/A 03/23/2016   Procedure: COLONOSCOPY;  Surgeon: Ruby Corporal, MD;  [facility];  Service: Endoscopy;  Laterality: N/A;  1:00   CYSTOSCOPY WITH INSERTION OF UROLIFT     ESOPHAGEAL BRUSHING  10/05/2022   Procedure: ESOPHAGEAL BRUSHING;  Surgeon: Urban Garden, MD;  [facility];  Service: Gastroenterology;;   ESOPHAGOGASTRODUODENOSCOPY  11/08/2011   Procedure: ESOPHAGOGASTRODUODENOSCOPY (EGD);  Surgeon: Ruby Corporal, MD;  [facility];  Service: Endoscopy;  Laterality: N/A;  300   ESOPHAGOGASTRODUODENOSCOPY (EGD) WITH PROPOFOL  N/A 10/05/2022   Procedure: ESOPHAGOGASTRODUODENOSCOPY (EGD) WITH PROPOFOL ;  Surgeon: Urban Garden, MD;  [facility];  Service: Gastroenterology;  Laterality: N/A;   ESOPHAGOGASTRODUODENOSCOPY (EGD) WITH PROPOFOL  N/A 01/02/2023   Procedure: ESOPHAGOGASTRODUODENOSCOPY (EGD) WITH PROPOFOL ;  Surgeon: Urban Garden, MD;  [facility];  Service: Gastroenterology;  Laterality: N/A;  1:30 pm, asa 3   HEMOSTASIS CLIP PLACEMENT  10/05/2022   Procedure: HEMOSTASIS CLIP PLACEMENT;  Surgeon: Urban Garden, MD;  [facility];  Service: Gastroenterology;;   POLYPECTOMY  03/23/2016   Procedure: POLYPECTOMY;  Surgeon: Ruby Corporal, MD;  [facility];  Service: Endoscopy;;  Cecal polyp removed via cold  forceps recto-sigmoid polyp removed via cold snare   POLYPECTOMY  10/05/2022   Procedure: POLYPECTOMY;  Surgeon: Urban Garden, MD;  [facility];  Service: Gastroenterology;;   TRANSURETHRAL RESECTION OF PROSTATE N/A 04/29/2020   Procedure:  TRANSURETHRAL RESECTION OF THE PROSTATE (TURP);  Surgeon: Trent Frizzle, MD;  Location: [facility];  Service: Urology;  Laterality: N/A;  90 MINS    Family History  Problem Relation Age of Onset   Colon cancer Mother    Heart disease Father    Stroke Brother    Healthy Daughter    Social History:  reports that he has never smoked. He has never been exposed to tobacco smoke. He has never used smokeless tobacco. He reports that he does not drink alcohol  and does not use drugs.  Allergies: No Known Allergies  Medications Prior to Admission  Medication Sig Dispense Refill   CALCIUM PO Take 300 mg by mouth in the morning and at bedtime.     carvedilol  (COREG ) 12.5 MG tablet Take 1 tablet (12.5 mg total) by mouth 2 (two) times daily. 60 tablet 0   Cholecalciferol (VITAMIN D3) 50 MCG (2000 UT) TABS Take 2,000 Units by mouth daily.     cyanocobalamin  (VITAMIN B12) 1000 MCG tablet Take 1,000 mcg by mouth daily.     diltiazem  (CARDIZEM  CD) 180 MG 24 hr capsule Take 180 mg by mouth 2 (two) times daily.     levothyroxine  (SYNTHROID , LEVOTHROID) 75 MCG tablet Take 75 mcg by mouth daily before breakfast.     magnesium  oxide (MAG-OX) 400 (240 Mg) MG tablet Take 400 mg by mouth 2 (two) times daily.     Melatonin 10 MG TABS Take 10 mg by mouth at bedtime.     omeprazole  (PRILOSEC) 40 MG capsule TAKE ONE CAPSULE BY MOUTH EVERY DAY 90 capsule 1   Probiotic Product (ALIGN) 4 MG CAPS Take 4 mg by mouth in the morning.     traZODone  (DESYREL ) 50 MG tablet Take 50 mg by mouth at bedtime.     Vedolizumab  (ENTYVIO  IV) Inject into the vein. Every 8 weeks.     acetaminophen  (TYLENOL ) 500 MG tablet Take 1,000 mg by mouth every 6 (six) hours as needed for mild pain (pain score 1-3) or moderate pain (pain score 4-6).     lidocaine  (LIDODERM ) 5 % Place 1 patch onto the skin daily. Remove & Discard patch within 12 hours or as directed by MD 5 patch 0   nitroGLYCERIN  (NITROSTAT ) 0.4 MG SL tablet  Place 0.4 mg under the tongue every 5 (five) minutes as needed. For chest pains. May repeat for up to 3 doses.     warfarin (COUMADIN ) 2.5 MG tablet TAKE 1 TABLET TO 1 1/2 TABLETS BY MOUTH DAILY OR AS DIRECTED BY COUMADIN  CLINIC (Patient taking differently: Take 2.5 mg by mouth See admin instructions. TAKE 1 TABLET TO 1 1/2 TABLETS BY MOUTH DAILY OR AS DIRECTED BY COUMADIN  CLINIC) 45 tablet 5    No results found for this or any previous visit (from the past 48 hours). No results found.  Review of Systems  All other systems reviewed and are negative.   Blood pressure (!) 172/88, pulse 74, temperature 98.9 F (37.2 C), temperature source Oral, resp. rate 16, SpO2 100%. Physical Exam  GENERAL: The patient is AO x3, in no acute distress. HEENT: Head is normocephalic and atraumatic. EOMI are intact. Mouth is well hydrated and without lesions. NECK: Supple. No masses LUNGS:  Clear to auscultation. No presence of rhonchi/wheezing/rales. Adequate chest expansion HEART: RRR, normal s1 and s2. ABDOMEN: Soft, nontender, no guarding, no peritoneal signs, and nondistended. BS +. No masses. EXTREMITIES: Without any cyanosis, clubbing, rash, lesions or edema. NEUROLOGIC: AOx3, no focal motor deficit. SKIN: no jaundice, no rashes  Assessment/Plan KAYLA MERTEL is a 89 y.o. male male with past medical history of small bowel Crohn's disease status post resection complicated by recurrent stricture , C, Stelara  induced thrombocytopenia, C difficile colitis, BPH, coronary artery disease, atrial fibrillation, GERD, hypothyroidism , who presents for follow up of Duodenal neuroendocrine tumor.  Will proceed with EGD.  Urban Garden, MD 02/15/2024, 12:48 PM

## 2024-02-18 ENCOUNTER — Encounter (HOSPITAL_COMMUNITY): Payer: Self-pay | Admitting: Gastroenterology

## 2024-02-18 ENCOUNTER — Inpatient Hospital Stay: Payer: PPO | Attending: Hematology

## 2024-02-18 DIAGNOSIS — D696 Thrombocytopenia, unspecified: Secondary | ICD-10-CM | POA: Insufficient documentation

## 2024-02-18 DIAGNOSIS — Z79899 Other long term (current) drug therapy: Secondary | ICD-10-CM | POA: Diagnosis not present

## 2024-02-18 DIAGNOSIS — E538 Deficiency of other specified B group vitamins: Secondary | ICD-10-CM | POA: Diagnosis not present

## 2024-02-18 DIAGNOSIS — C7A010 Malignant carcinoid tumor of the duodenum: Secondary | ICD-10-CM | POA: Insufficient documentation

## 2024-02-18 LAB — CBC WITH DIFFERENTIAL/PLATELET
Abs Immature Granulocytes: 0.02 10*3/uL (ref 0.00–0.07)
Basophils Absolute: 0.1 10*3/uL (ref 0.0–0.1)
Basophils Relative: 1 %
Eosinophils Absolute: 0.2 10*3/uL (ref 0.0–0.5)
Eosinophils Relative: 2 %
HCT: 43.7 % (ref 39.0–52.0)
Hemoglobin: 14.5 g/dL (ref 13.0–17.0)
Immature Granulocytes: 0 %
Lymphocytes Relative: 26 %
Lymphs Abs: 1.9 10*3/uL (ref 0.7–4.0)
MCH: 30.3 pg (ref 26.0–34.0)
MCHC: 33.2 g/dL (ref 30.0–36.0)
MCV: 91.2 fL (ref 80.0–100.0)
Monocytes Absolute: 0.6 10*3/uL (ref 0.1–1.0)
Monocytes Relative: 8 %
Neutro Abs: 4.5 10*3/uL (ref 1.7–7.7)
Neutrophils Relative %: 63 %
Platelets: 170 10*3/uL (ref 150–400)
RBC: 4.79 MIL/uL (ref 4.22–5.81)
RDW: 13.5 % (ref 11.5–15.5)
WBC: 7.2 10*3/uL (ref 4.0–10.5)
nRBC: 0 % (ref 0.0–0.2)

## 2024-02-18 LAB — VITAMIN B12: Vitamin B-12: 1145 pg/mL — ABNORMAL HIGH (ref 180–914)

## 2024-02-19 DIAGNOSIS — H43393 Other vitreous opacities, bilateral: Secondary | ICD-10-CM | POA: Diagnosis not present

## 2024-02-20 ENCOUNTER — Encounter: Attending: Gastroenterology | Admitting: *Deleted

## 2024-02-20 DIAGNOSIS — Z5181 Encounter for therapeutic drug level monitoring: Secondary | ICD-10-CM | POA: Diagnosis not present

## 2024-02-20 DIAGNOSIS — I4891 Unspecified atrial fibrillation: Secondary | ICD-10-CM | POA: Insufficient documentation

## 2024-02-20 DIAGNOSIS — K50018 Crohn's disease of small intestine with other complication: Secondary | ICD-10-CM | POA: Insufficient documentation

## 2024-02-20 LAB — POCT INR: INR: 1.3 — AB (ref 2.0–3.0)

## 2024-02-20 NOTE — Patient Instructions (Signed)
 S/P EGD on 5/2.  Was off warfarin 5 days Take warfarin 1 1/2 tablets tonight and tomorrow night then resume 1 tablet daily except 1 1/2 tablets on Mondays Recheck 1 week

## 2024-02-24 LAB — METHYLMALONIC ACID, SERUM: Methylmalonic Acid, Quantitative: 215 nmol/L (ref 0–378)

## 2024-02-25 ENCOUNTER — Inpatient Hospital Stay: Admitting: Hematology

## 2024-02-25 ENCOUNTER — Inpatient Hospital Stay

## 2024-02-25 ENCOUNTER — Inpatient Hospital Stay: Payer: PPO | Admitting: Physician Assistant

## 2024-02-25 VITALS — BP 142/69 | HR 69 | Temp 97.9°F | Resp 18 | Ht 68.0 in | Wt 160.3 lb

## 2024-02-25 DIAGNOSIS — D3A8 Other benign neuroendocrine tumors: Secondary | ICD-10-CM

## 2024-02-25 DIAGNOSIS — C7A010 Malignant carcinoid tumor of the duodenum: Secondary | ICD-10-CM | POA: Diagnosis not present

## 2024-02-25 DIAGNOSIS — K50012 Crohn's disease of small intestine with intestinal obstruction: Secondary | ICD-10-CM | POA: Diagnosis not present

## 2024-02-25 DIAGNOSIS — R109 Unspecified abdominal pain: Secondary | ICD-10-CM | POA: Diagnosis not present

## 2024-02-25 LAB — HEPATIC FUNCTION PANEL
ALT: 16 U/L (ref 0–44)
AST: 20 U/L (ref 15–41)
Albumin: 4 g/dL (ref 3.5–5.0)
Alkaline Phosphatase: 59 U/L (ref 38–126)
Bilirubin, Direct: 0.1 mg/dL (ref 0.0–0.2)
Indirect Bilirubin: 0.5 mg/dL (ref 0.3–0.9)
Total Bilirubin: 0.6 mg/dL (ref 0.0–1.2)
Total Protein: 7 g/dL (ref 6.5–8.1)

## 2024-02-25 NOTE — Patient Instructions (Addendum)
 You were seen and examined today by Dr. Cheree Cords. Dr. Katragadda is a medical oncologist, meaning that he specializes in the treatment of cancer diagnoses. Dr. Cheree Cords discussed your past medical history, family history of cancers, and the events that led to you being here today.  We will do lab work today. We will also get a PET scan. We will see you back after the scan to talk to about the results.   Return as scheduled.

## 2024-02-25 NOTE — Progress Notes (Signed)
 [facility] [address] [place] [address]   Clinic Day:  02/25/2024  Referring physician: Orest Bio, MD  Patient Care Team: Anthony Bio, MD as PCP - General (Cardiology) Anthony Knight, MD as PCP - Cardiology (Cardiology)   ASSESSMENT & PLAN:   Assessment:  1.  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 Sammi [name] - EGD (10/05/2022): Nodular mucosa in the second part of the duodenum, measured close to 1 cm. - Pathology: Well-differentiated neuroendocrine tumor, present in submucosal margin, negative for mitotic activity (Ki 67 <1%).  Tumor invades the lamina propria and diffusely involves muscularis propria extending into the submucosa. - EGD (01/02/2023): 15 mm submucosal nodule found in the first part of the duodenum. - Pathology: Well-differentiated neuroendocrine tumor, WHO grade 1, negative for mitotic activity (Ki-67 <1%).  Tumor is diffusely and strongly positive for neuroendocrine marker synaptophysin and shows positivity for chromogranin. - CTAP (12/06/2023): Colonic diverticulosis. - EGD (02/15/2024): 1.5 cm Sub mucosal nodule in the second portion of the duodenum.  Size appears to be similar but surface appears to be eroded.  2.  Social/family history: - He lives at home with his wife.  He is independent of ADLs and ADLs.  Worked for [employer] and [employer] in the past.  Non-smoker. - Mother had colon cancer.  Plan:  1.  Well-differentiated carcinoid tumor of the second part of the duodenum: - He does not report any signs or symptoms of carcinoid syndrome.  No weight loss reported.  Recently finished physical therapy for right hip pain. - Recommend further workup with 24-hour urine for 5-HIAA.  Will also check chromogranin level and LFTs. - Recommend dotatate PET scan. - RTC 1 week after the PET scan to discuss options including lanreotide.  2.  Vitamin B12 deficiency: - MMA  was normal at 215 and B12 normal at 1145. - Continue B12 1 mg tablet daily.  3.  Thrombocytopenia: - Previous workup for thrombocytopenia was negative.  Thought to be secondary to [name] .  Latest CBC on 02/18/2024 shows platelet count 170.  Continue [name]  every 8 weeks per Dr. Sammi [name].   No orders of the defined types were placed in this encounter.     [name],acting as a Neurosurgeon for Anthony Boros, MD.,have documented all relevant documentation on the behalf of Anthony Boros, MD,as directed by  Anthony Boros, MD while in the presence of Anthony Boros, MD.   I, Anthony Boros MD, have reviewed the above documentation for accuracy and completeness, and I agree with the above.   Anthony Boros, MD   5/12/20251:44 PM  CHIEF COMPLAINT/PURPOSE OF CONSULT:   Diagnosis: Duodenal carcinoid   Cancer Staging  No matching staging information was found for the patient.    Prior Therapy: None  Current Therapy: Under workup   HISTORY OF PRESENT ILLNESS:   Oncology History   No history exists.      [name] is a 89 y.o. male presenting to clinic today for evaluation of malignant GIST of small bowel at the request of Anthony Garden, MD.  Patient has a medical history of duodenal carcinoid, small bowel Crohn's disease s/p resection, C. Diff colitis, BPH, CAD, A-fib, chronic diastolic heart failure, GERD, hypothyroidism, anemia and thrombocytopenia.   [name] underwent an EGD on 02/15/24 for follow-up of duodenal NET with Dr. Sammi [name]. A 15 mm protruding, sessile, submucosa nodule was found in the second portion of the  duodenum.   Today, he states that he is doing well overall. His appetite level is at 100%. His energy level is at 50%.  PAST MEDICAL HISTORY:   Past Medical History: Past Medical History:  Diagnosis Date   Anemia    Atrial fibrillation (HCC)    BPH (benign prostatic hyperplasia)    CAD (coronary artery disease)     Catheterization 2004, mild/moderate nonobstructive disease  /   nuclear, 2007, small inferior scar // no ischemia   Crohn's disease (HCC)    Elevated PSA    GERD (gastroesophageal reflux disease)    History of kidney stones    History of pneumonia    Hypothyroidism    Mitral regurgitation    Pneumonia    SBO (small bowel obstruction) (HCC)    Urinary retention    Wears glasses     Surgical History: Past Surgical History:  Procedure Laterality Date   AGILE CAPSULE  10/18/2011   Procedure: AGILE CAPSULE;  Surgeon: Anthony Corporal, MD;  [facility];  Service: Endoscopy;  Laterality: N/A;  730   BIOPSY  10/05/2022   Procedure: BIOPSY;  Surgeon: Anthony Garden, MD;  [facility];  Service: Gastroenterology;;   BIOPSY  01/02/2023   Procedure: BIOPSY;  Surgeon: Anthony Garden, MD;  [facility];  Service: Gastroenterology;;   CARDIAC CATHETERIZATION  2004   CATARACT EXTRACTION W/PHACO Right 06/27/2021   Procedure: CATARACT EXTRACTION PHACO AND INTRAOCULAR LENS PLACEMENT (IOC);  Surgeon: Anthony Farm, MD;  [facility];  Service: Ophthalmology;  Laterality: Right;  CDE 21.98   CATARACT EXTRACTION W/PHACO Left 07/11/2021   Procedure: CATARACT EXTRACTION PHACO AND INTRAOCULAR LENS PLACEMENT LEFT EYE;  Surgeon: Anthony Farm, MD;  [facility]: [facility];  Service: Ophthalmology;  Laterality: Left;  CDE=10.42   CHOLECYSTECTOMY  2010   [name]   COLONOSCOPY  2008   [name]   COLONOSCOPY N/A 03/23/2016   Procedure: COLONOSCOPY;  Surgeon: Anthony Corporal, MD;  [facility];  Service: Endoscopy;  Laterality: N/A;  1:00   CYSTOSCOPY WITH INSERTION OF UROLIFT     ESOPHAGEAL BRUSHING  10/05/2022   Procedure: ESOPHAGEAL BRUSHING;  Surgeon: Anthony Garden, MD;  [facility];  Service: Gastroenterology;;   ESOPHAGOGASTRODUODENOSCOPY  11/08/2011   Procedure: ESOPHAGOGASTRODUODENOSCOPY (EGD);  Surgeon: Anthony Corporal, MD;  [facility];  Service: Endoscopy;  Laterality: N/A;  300   ESOPHAGOGASTRODUODENOSCOPY N/A 02/15/2024   Procedure: EGD (ESOPHAGOGASTRODUODENOSCOPY);  Surgeon: [name], Anthony Limes, MD;  [facility];  Service: Gastroenterology;  Laterality: N/A;  2:00PM;ASA 3   ESOPHAGOGASTRODUODENOSCOPY (EGD) WITH PROPOFOL  N/A 10/05/2022   Procedure: ESOPHAGOGASTRODUODENOSCOPY (EGD) WITH PROPOFOL ;  Surgeon: Anthony Garden, MD;  [facility];  Service: Gastroenterology;  Laterality: N/A;   ESOPHAGOGASTRODUODENOSCOPY (EGD) WITH PROPOFOL  N/A 01/02/2023   Procedure: ESOPHAGOGASTRODUODENOSCOPY (EGD) WITH PROPOFOL ;  Surgeon: Anthony Garden, MD;  [facility];  Service: Gastroenterology;  Laterality: N/A;  1:30 pm, asa 3   HEMOSTASIS CLIP PLACEMENT  10/05/2022   Procedure: HEMOSTASIS CLIP PLACEMENT;  Surgeon: Anthony Garden, MD;  [facility];  Service: Gastroenterology;;   POLYPECTOMY  03/23/2016   Procedure: POLYPECTOMY;  Surgeon: Anthony Corporal, MD;  [facility];  Service: Endoscopy;;  Cecal polyp removed via cold forceps recto-sigmoid polyp removed via cold snare   POLYPECTOMY  10/05/2022   Procedure: POLYPECTOMY;  Surgeon: Anthony Garden, MD;  [facility];  Service: Gastroenterology;;   TRANSURETHRAL RESECTION OF PROSTATE N/A 04/29/2020   Procedure: TRANSURETHRAL RESECTION OF THE PROSTATE (TURP);  Surgeon: Trent Frizzle, MD;  Location: [facility];  Service: Urology;  Laterality: N/A;  39 MINS    Social History: Social History   Socioeconomic History   Marital status: Married    Spouse name: Not on file   Number of children: Not on file   Years of education: Not on file   Highest education level: Not on file  Occupational History   Occupation: Retired-Hospital Radio broadcast assistant: RETIRED  Tobacco Use   Smoking status: Never    Passive exposure:  Never   Smokeless tobacco: Never  Vaping Use   Vaping status: Never Used  Substance and Sexual Activity   Alcohol  use: No    Alcohol /week: 0.0 standard drinks of alcohol    Drug use: No   Sexual activity: Not on file  Other Topics Concern   Not on file  Social History Narrative   Married   Social Drivers of Health   Financial Resource Strain: Not on file  Food Insecurity: No Food Insecurity (10/02/2022)   Hunger Vital Sign    Worried About Running Out of Food in the Last Year: Never true    Ran Out of Food in the Last Year: Never true  Transportation Needs: No Transportation Needs (10/02/2022)   PRAPARE - Administrator, Civil Service (Medical): No    Lack of Transportation (Non-Medical): No  Physical Activity: Not on file  Stress: Not on file  Social Connections: Not on file  Intimate Partner Violence: Not At Risk (10/02/2022)   Humiliation, Afraid, Rape, and Kick questionnaire    Fear of Current or Ex-Partner: No    Emotionally Abused: No    Physically Abused: No    Sexually Abused: No    Family History: Family History  Problem Relation Age of Onset   Colon cancer Mother    Heart disease Father    Stroke Brother    Healthy Daughter     Current Medications:  Current Outpatient Medications:    acetaminophen  (TYLENOL ) 500 MG tablet, Take 1,000 mg by mouth every 6 (six) hours as needed for mild pain (pain score 1-3) or moderate pain (pain score 4-6)., Disp: , Rfl:    CALCIUM PO, Take 300 mg by mouth in the morning and at bedtime., Disp: , Rfl:    carvedilol  (COREG ) 12.5 MG tablet, Take 1 tablet (12.5 mg total) by mouth 2 (two) times daily., Disp: 60 tablet, Rfl: 0   Cholecalciferol (VITAMIN D3) 50 MCG (2000 UT) TABS, Take 2,000 Units by mouth daily., Disp: , Rfl:    cyanocobalamin  (VITAMIN B12) 1000 MCG tablet, Take 1,000 mcg by mouth daily., Disp: , Rfl:    diltiazem  (CARDIZEM  CD) 180 MG 24 hr capsule, Take 180 mg by mouth 2 (two) times daily., Disp: ,  Rfl:    levothyroxine  (SYNTHROID , LEVOTHROID) 75 MCG tablet, Take 75 mcg by mouth daily before breakfast., Disp: , Rfl:    lidocaine  (LIDODERM ) 5 %, Place 1 patch onto the skin daily. Remove & Discard patch within 12 hours or as directed by MD, Disp: 5 patch, Rfl: 0   magnesium  oxide (MAG-OX) 400 (240 Mg) MG tablet, Take 400 mg by mouth 2 (two) times daily., Disp: , Rfl:    Melatonin 10 MG TABS, Take 10 mg by mouth at bedtime., Disp: , Rfl:    nitroGLYCERIN  (NITROSTAT )  0.4 MG SL tablet, Place 0.4 mg under the tongue every 5 (five) minutes as needed. For chest pains. May repeat for up to 3 doses., Disp: , Rfl:    omeprazole  (PRILOSEC) 40 MG capsule, TAKE ONE CAPSULE BY MOUTH EVERY DAY, Disp: 90 capsule, Rfl: 1   Probiotic Product (ALIGN) 4 MG CAPS, Take 4 mg by mouth in the morning., Disp: , Rfl:    traZODone  (DESYREL ) 50 MG tablet, Take 50 mg by mouth at bedtime., Disp: , Rfl:    Vedolizumab  ([name]  IV), Inject into the vein. Every 8 weeks., Disp: , Rfl:    warfarin (COUMADIN ) 2.5 MG tablet, TAKE 1 TABLET TO 1 1/2 TABLETS BY MOUTH DAILY OR AS DIRECTED BY COUMADIN  CLINIC (Patient taking differently: Take 2.5 mg by mouth See admin instructions. TAKE 1 TABLET TO 1 1/2 TABLETS BY MOUTH DAILY OR AS DIRECTED BY COUMADIN  CLINIC), Disp: 45 tablet, Rfl: 5   Allergies: No Known Allergies  REVIEW OF SYSTEMS:   Review of Systems  Constitutional:  Negative for chills, fatigue and fever.  HENT:   Negative for lump/mass, mouth sores, nosebleeds, sore throat and trouble swallowing.   Eyes:  Negative for eye problems.  Respiratory:  Negative for cough and shortness of breath.   Cardiovascular:  Negative for chest pain, leg swelling and palpitations.  Gastrointestinal:  Negative for abdominal pain, constipation, diarrhea, nausea and vomiting.  Genitourinary:  Negative for bladder incontinence, difficulty urinating, dysuria, frequency, hematuria and nocturia.   Musculoskeletal:  Positive for arthralgias  (Right hip pain). Negative for back pain, flank pain, myalgias and neck pain.  Skin:  Negative for itching and rash.  Neurological:  Positive for dizziness. Negative for headaches and numbness.  Hematological:  Does not bruise/bleed easily.  Psychiatric/Behavioral:  Positive for sleep disturbance. Negative for depression and suicidal ideas. The patient is not nervous/anxious.   All other systems reviewed and are negative.    VITALS:   Blood pressure (!) 151/73, pulse 69, temperature 97.9 F (36.6 C), temperature source Oral, resp. rate 18, height 5\' 8"  (1.727 m), weight 160 lb 4.4 oz (72.7 kg), SpO2 96%.  Wt Readings from Last 3 Encounters:  02/25/24 160 lb 4.4 oz (72.7 kg)  02/08/24 157 lb 3.2 oz (71.3 kg)  12/24/23 154 lb 5.2 oz (70 kg)    Body mass index is 24.37 kg/m.  Performance status (ECOG): 1 - Symptomatic but completely ambulatory  PHYSICAL EXAM:   Physical Exam Vitals and nursing note reviewed. Exam conducted with a chaperone present.  Constitutional:      Appearance: Normal appearance.  Cardiovascular:     Rate and Rhythm: Normal rate and regular rhythm.     Pulses: Normal pulses.     Heart sounds: Normal heart sounds.  Pulmonary:     Effort: Pulmonary effort is normal.     Breath sounds: Normal breath sounds.  Abdominal:     Palpations: Abdomen is soft. There is no hepatomegaly, splenomegaly or mass.     Tenderness: There is no abdominal tenderness.  Musculoskeletal:     Right lower leg: No edema.     Left lower leg: No edema.  Lymphadenopathy:     Cervical: No cervical adenopathy.     Right cervical: No superficial, deep or posterior cervical adenopathy.    Left cervical: No superficial, deep or posterior cervical adenopathy.     Upper Body:     Right upper body: No supraclavicular or axillary adenopathy.     Left upper body: No  supraclavicular or axillary adenopathy.  Neurological:     General: No focal deficit present.     Mental Status: He is alert  and oriented to person, place, and time.  Psychiatric:        Mood and Affect: Mood normal.        Behavior: Behavior normal.     LABS:   CBC    Component Value Date/Time   WBC 7.2 02/18/2024 1042   RBC 4.79 02/18/2024 1042   HGB 14.5 02/18/2024 1042   HGB 12.9 (L) 05/03/2023 0844   HCT 43.7 02/18/2024 1042   HCT 38.7 05/03/2023 0844   PLT 170 02/18/2024 1042   PLT 67 (LL) 05/09/2023 0858   MCV 91.2 02/18/2024 1042   MCV 91 05/03/2023 0844   MCH 30.3 02/18/2024 1042   MCHC 33.2 02/18/2024 1042   RDW 13.5 02/18/2024 1042   RDW 14.2 05/03/2023 0844   LYMPHSABS 1.9 02/18/2024 1042   LYMPHSABS 2.1 05/03/2023 0844   MONOABS 0.6 02/18/2024 1042   EOSABS 0.2 02/18/2024 1042   EOSABS 0.2 05/03/2023 0844   BASOSABS 0.1 02/18/2024 1042   BASOSABS 0.1 05/03/2023 0844    CMP    Component Value Date/Time   NA 137 12/24/2023 1004   K 3.8 12/24/2023 1004   CL 100 12/24/2023 1004   CO2 26 12/24/2023 1004   GLUCOSE 119 (H) 12/24/2023 1004   BUN 17 12/24/2023 1004   CREATININE 1.24 12/24/2023 1004   CREATININE 1.76 (H) 04/03/2023 0828   CALCIUM 9.2 12/24/2023 1004   PROT 6.6 12/24/2023 1004   ALBUMIN 3.6 12/24/2023 1004   AST 19 12/24/2023 1004   ALT 19 12/24/2023 1004   ALKPHOS 61 12/24/2023 1004   BILITOT 1.0 12/24/2023 1004   GFRNONAA 55 (L) 12/24/2023 1004     No results found for: "CEA1", "CEA" / No results found for: "CEA1", "CEA" No results found for: "PSA1" No results found for: "CAN199" No results found for: "CAN125"  Lab Results  Component Value Date   TOTALPROTELP 7.1 05/22/2023   TOTALPROTELP 7.5 05/22/2023   ALBUMINELP 4.0 05/22/2023   A1GS 0.2 05/22/2023   A2GS 0.7 05/22/2023   BETS 1.0 05/22/2023   GAMS 1.1 05/22/2023   MSPIKE Not Observed 05/22/2023   SPEI Comment 05/22/2023   Lab Results  Component Value Date   TIBC 349 05/22/2023   FERRITIN 96 05/22/2023   IRONPCTSAT 23 05/22/2023   Lab Results  Component Value Date   LDH 145 05/22/2023      STUDIES:   No results found.

## 2024-02-26 ENCOUNTER — Ambulatory Visit

## 2024-02-27 ENCOUNTER — Inpatient Hospital Stay (HOSPITAL_COMMUNITY)

## 2024-02-27 ENCOUNTER — Other Ambulatory Visit: Payer: Self-pay

## 2024-02-27 ENCOUNTER — Ambulatory Visit

## 2024-02-27 ENCOUNTER — Inpatient Hospital Stay (HOSPITAL_COMMUNITY)
Admission: EM | Admit: 2024-02-27 | Discharge: 2024-03-03 | DRG: 386 | Disposition: A | Discharge status: Home / Self Care | Attending: Internal Medicine | Admitting: Internal Medicine

## 2024-02-27 ENCOUNTER — Emergency Department (HOSPITAL_COMMUNITY)

## 2024-02-27 ENCOUNTER — Encounter (HOSPITAL_COMMUNITY): Payer: Self-pay | Admitting: *Deleted

## 2024-02-27 ENCOUNTER — Encounter

## 2024-02-27 DIAGNOSIS — R110 Nausea: Secondary | ICD-10-CM | POA: Diagnosis not present

## 2024-02-27 DIAGNOSIS — K50018 Crohn's disease of small intestine with other complication: Secondary | ICD-10-CM | POA: Diagnosis not present

## 2024-02-27 DIAGNOSIS — I10 Essential (primary) hypertension: Secondary | ICD-10-CM | POA: Diagnosis not present

## 2024-02-27 DIAGNOSIS — I4821 Permanent atrial fibrillation: Secondary | ICD-10-CM | POA: Diagnosis not present

## 2024-02-27 DIAGNOSIS — K50012 Crohn's disease of small intestine with intestinal obstruction: Principal | ICD-10-CM | POA: Diagnosis present

## 2024-02-27 DIAGNOSIS — D696 Thrombocytopenia, unspecified: Secondary | ICD-10-CM | POA: Diagnosis not present

## 2024-02-27 DIAGNOSIS — Z79899 Other long term (current) drug therapy: Secondary | ICD-10-CM

## 2024-02-27 DIAGNOSIS — I129 Hypertensive chronic kidney disease with stage 1 through stage 4 chronic kidney disease, or unspecified chronic kidney disease: Secondary | ICD-10-CM | POA: Diagnosis present

## 2024-02-27 DIAGNOSIS — Z9079 Acquired absence of other genital organ(s): Secondary | ICD-10-CM

## 2024-02-27 DIAGNOSIS — K219 Gastro-esophageal reflux disease without esophagitis: Secondary | ICD-10-CM | POA: Diagnosis present

## 2024-02-27 DIAGNOSIS — N1832 Chronic kidney disease, stage 3b: Secondary | ICD-10-CM | POA: Diagnosis present

## 2024-02-27 DIAGNOSIS — K449 Diaphragmatic hernia without obstruction or gangrene: Secondary | ICD-10-CM | POA: Diagnosis not present

## 2024-02-27 DIAGNOSIS — Z8249 Family history of ischemic heart disease and other diseases of the circulatory system: Secondary | ICD-10-CM | POA: Diagnosis not present

## 2024-02-27 DIAGNOSIS — E876 Hypokalemia: Secondary | ICD-10-CM | POA: Diagnosis not present

## 2024-02-27 DIAGNOSIS — Z800 Family history of malignant neoplasm of digestive organs: Secondary | ICD-10-CM | POA: Diagnosis not present

## 2024-02-27 DIAGNOSIS — C7A019 Malignant carcinoid tumor of the small intestine, unspecified portion: Secondary | ICD-10-CM | POA: Diagnosis not present

## 2024-02-27 DIAGNOSIS — N400 Enlarged prostate without lower urinary tract symptoms: Secondary | ICD-10-CM | POA: Diagnosis not present

## 2024-02-27 DIAGNOSIS — Z79890 Hormone replacement therapy (postmenopausal): Secondary | ICD-10-CM | POA: Diagnosis not present

## 2024-02-27 DIAGNOSIS — K56609 Unspecified intestinal obstruction, unspecified as to partial versus complete obstruction: Secondary | ICD-10-CM | POA: Diagnosis not present

## 2024-02-27 DIAGNOSIS — Z9049 Acquired absence of other specified parts of digestive tract: Secondary | ICD-10-CM | POA: Diagnosis not present

## 2024-02-27 DIAGNOSIS — R531 Weakness: Secondary | ICD-10-CM | POA: Diagnosis not present

## 2024-02-27 DIAGNOSIS — Z7901 Long term (current) use of anticoagulants: Secondary | ICD-10-CM

## 2024-02-27 DIAGNOSIS — R197 Diarrhea, unspecified: Secondary | ICD-10-CM | POA: Diagnosis not present

## 2024-02-27 DIAGNOSIS — K56699 Other intestinal obstruction unspecified as to partial versus complete obstruction: Secondary | ICD-10-CM | POA: Diagnosis not present

## 2024-02-27 DIAGNOSIS — Z9889 Other specified postprocedural states: Secondary | ICD-10-CM | POA: Diagnosis not present

## 2024-02-27 DIAGNOSIS — K5000 Crohn's disease of small intestine without complications: Secondary | ICD-10-CM | POA: Diagnosis not present

## 2024-02-27 DIAGNOSIS — E039 Hypothyroidism, unspecified: Secondary | ICD-10-CM | POA: Diagnosis present

## 2024-02-27 DIAGNOSIS — Z823 Family history of stroke: Secondary | ICD-10-CM

## 2024-02-27 DIAGNOSIS — R109 Unspecified abdominal pain: Secondary | ICD-10-CM | POA: Diagnosis present

## 2024-02-27 DIAGNOSIS — Z4682 Encounter for fitting and adjustment of non-vascular catheter: Secondary | ICD-10-CM | POA: Diagnosis not present

## 2024-02-27 DIAGNOSIS — I2510 Atherosclerotic heart disease of native coronary artery without angina pectoris: Secondary | ICD-10-CM | POA: Diagnosis not present

## 2024-02-27 DIAGNOSIS — K4020 Bilateral inguinal hernia, without obstruction or gangrene, not specified as recurrent: Secondary | ICD-10-CM | POA: Diagnosis not present

## 2024-02-27 DIAGNOSIS — K5790 Diverticulosis of intestine, part unspecified, without perforation or abscess without bleeding: Secondary | ICD-10-CM | POA: Diagnosis not present

## 2024-02-27 LAB — COMPREHENSIVE METABOLIC PANEL WITH GFR
ALT: 15 U/L (ref 0–44)
AST: 20 U/L (ref 15–41)
Albumin: 3.5 g/dL (ref 3.5–5.0)
Alkaline Phosphatase: 49 U/L (ref 38–126)
Anion gap: 8 (ref 5–15)
BUN: 19 mg/dL (ref 8–23)
CO2: 24 mmol/L (ref 22–32)
Calcium: 9 mg/dL (ref 8.9–10.3)
Chloride: 106 mmol/L (ref 98–111)
Creatinine, Ser: 1.57 mg/dL — ABNORMAL HIGH (ref 0.61–1.24)
GFR, Estimated: 42 mL/min — ABNORMAL LOW (ref >60)
Glucose, Bld: 122 mg/dL — ABNORMAL HIGH (ref 70–99)
Potassium: 3.9 mmol/L (ref 3.5–5.1)
Sodium: 138 mmol/L (ref 135–145)
Total Bilirubin: 1 mg/dL (ref 0.0–1.2)
Total Protein: 6 g/dL — ABNORMAL LOW (ref 6.5–8.1)

## 2024-02-27 LAB — CBC WITH DIFFERENTIAL/PLATELET
Abs Immature Granulocytes: 0.03 10*3/uL (ref 0.00–0.07)
Basophils Absolute: 0.1 10*3/uL (ref 0.0–0.1)
Basophils Relative: 1 %
Eosinophils Absolute: 0.1 10*3/uL (ref 0.0–0.5)
Eosinophils Relative: 2 %
HCT: 37.5 % — ABNORMAL LOW (ref 39.0–52.0)
Hemoglobin: 13.2 g/dL (ref 13.0–17.0)
Immature Granulocytes: 0 %
Lymphocytes Relative: 18 %
Lymphs Abs: 1.5 10*3/uL (ref 0.7–4.0)
MCH: 31.4 pg (ref 26.0–34.0)
MCHC: 35.2 g/dL (ref 30.0–36.0)
MCV: 89.3 fL (ref 80.0–100.0)
Monocytes Absolute: 0.6 10*3/uL (ref 0.1–1.0)
Monocytes Relative: 7 %
Neutro Abs: 6 10*3/uL (ref 1.7–7.7)
Neutrophils Relative %: 72 %
Platelets: 159 10*3/uL (ref 150–400)
RBC: 4.2 MIL/uL — ABNORMAL LOW (ref 4.22–5.81)
RDW: 13.8 % (ref 11.5–15.5)
WBC: 8.3 10*3/uL (ref 4.0–10.5)
nRBC: 0 % (ref 0.0–0.2)

## 2024-02-27 LAB — I-STAT CHEM 8, ED
BUN: 20 mg/dL (ref 8–23)
Calcium, Ion: 1.15 mmol/L (ref 1.15–1.40)
Chloride: 104 mmol/L (ref 98–111)
Creatinine, Ser: 1.5 mg/dL — ABNORMAL HIGH (ref 0.61–1.24)
Glucose, Bld: 120 mg/dL — ABNORMAL HIGH (ref 70–99)
HCT: 37 % — ABNORMAL LOW (ref 39.0–52.0)
Hemoglobin: 12.6 g/dL — ABNORMAL LOW (ref 13.0–17.0)
Potassium: 3.8 mmol/L (ref 3.5–5.1)
Sodium: 139 mmol/L (ref 135–145)
TCO2: 22 mmol/L (ref 22–32)

## 2024-02-27 LAB — LACTIC ACID, PLASMA
Lactic Acid, Venous: 1.5 mmol/L (ref 0.5–1.9)
Lactic Acid, Venous: 1 mmol/L (ref 0.5–1.9)

## 2024-02-27 LAB — CHROMOGRANIN A: Chromogranin A (ng/mL): 1265 ng/mL — ABNORMAL HIGH (ref 0.0–101.8)

## 2024-02-27 LAB — LIPASE, BLOOD: Lipase: 38 U/L (ref 11–51)

## 2024-02-27 LAB — PROTIME-INR
INR: 2.8 — ABNORMAL HIGH (ref 0.8–1.2)
Prothrombin Time: 29.5 s — ABNORMAL HIGH (ref 11.4–15.2)

## 2024-02-27 MED ORDER — IOHEXOL 300 MG/ML  SOLN
100.0000 mL | Freq: Once | INTRAMUSCULAR | Status: AC | PRN
Start: 2024-02-27 — End: 2024-02-27
  Administered 2024-02-27: 100 mL via INTRAVENOUS

## 2024-02-27 MED ORDER — LACTATED RINGERS IV BOLUS
1000.0000 mL | Freq: Once | INTRAVENOUS | Status: AC
  Administered 2024-02-27: 1000 mL via INTRAVENOUS

## 2024-02-27 MED ORDER — ONDANSETRON HCL 4 MG/2ML IJ SOLN: 4.0000 mg | Freq: Four times a day (QID) | INTRAMUSCULAR | Status: DC | PRN

## 2024-02-27 MED ORDER — LACTATED RINGERS IV SOLN: INTRAVENOUS | Status: AC

## 2024-02-27 MED ORDER — ONDANSETRON HCL 4 MG/2ML IJ SOLN
4.0000 mg | Freq: Four times a day (QID) | INTRAMUSCULAR | Status: DC | PRN
  Administered 2024-02-27: 4 mg via INTRAVENOUS
  Filled 2024-02-27: qty 2

## 2024-02-27 MED ORDER — ONDANSETRON HCL 4 MG PO TABS: 4.0000 mg | ORAL_TABLET | Freq: Four times a day (QID) | ORAL | Status: DC | PRN

## 2024-02-27 MED ORDER — PANTOPRAZOLE SODIUM 40 MG IV SOLR
40.0000 mg | Freq: Two times a day (BID) | INTRAVENOUS | Status: DC
  Administered 2024-02-27 – 2024-03-02 (×9): 40 mg via INTRAVENOUS
  Filled 2024-02-27 (×9): qty 10

## 2024-02-27 MED ORDER — LACTATED RINGERS IV SOLN: INTRAVENOUS | Status: DC

## 2024-02-27 MED ORDER — ACETAMINOPHEN 650 MG RE SUPP: 650.0000 mg | Freq: Four times a day (QID) | RECTAL | Status: DC | PRN

## 2024-02-27 MED ORDER — BISACODYL 10 MG RE SUPP
10.0000 mg | Freq: Every day | RECTAL | Status: DC
  Administered 2024-02-27 – 2024-02-29 (×3): 10 mg via RECTAL
  Filled 2024-02-27 (×3): qty 1

## 2024-02-27 MED ORDER — METOPROLOL TARTRATE 5 MG/5ML IV SOLN
2.5000 mg | Freq: Four times a day (QID) | INTRAVENOUS | Status: DC
  Administered 2024-02-27 – 2024-02-28 (×6): 2.5 mg via INTRAVENOUS
  Filled 2024-02-27 (×6): qty 5

## 2024-02-27 MED ORDER — ACETAMINOPHEN 160 MG/5ML PO SOLN
650.0000 mg | Freq: Four times a day (QID) | ORAL | Status: DC | PRN
  Administered 2024-02-28: 650 mg
  Filled 2024-02-27: qty 20.3

## 2024-02-27 MED ORDER — ONDANSETRON HCL 4 MG/2ML IJ SOLN
4.0000 mg | Freq: Once | INTRAMUSCULAR | Status: AC
  Administered 2024-02-27: 4 mg via INTRAVENOUS
  Filled 2024-02-27: qty 2

## 2024-02-27 NOTE — ED Provider Notes (Signed)
 [place] EMERGENCY DEPARTMENT AT [facility] Provider Note   CSN: [id number] Arrival date & time: 02/27/24  1308     Histor Chief Complaint  Patient presents with   Diarrhea    [name] is a 89 y.o. male.  89 yo M w/ h/o afib on coumadin , CAD, hypothyroid, crohns s/p small bowel resection about 20 years ago in [place] and recently diagnosed small bowel carcinoid tumor pending PET and treatment options here with abdominal pain. Had some nausea last couple days, some diarrhea and subsequently some abdominal discomfort. Is now not having BM or passing gas. No fever. No suspicious food intake. No sick contacts.    Diarrhea      Home Medications Prior to Admission medications   Medication Sig Start Date End Date Taking? Authorizing Provider  acetaminophen  (TYLENOL ) 500 MG tablet Take 1,000 mg by mouth every 6 (six) hours as needed for mild pain (pain score 1-3) or moderate pain (pain score 4-6).    [provider]  CALCIUM PO Take 300 mg by mouth in the morning and at bedtime.    [provider]  carvedilol  (COREG ) 12.5 MG tablet Take 1 tablet (12.5 mg total) by mouth 2 (two) times daily. 10/10/22 03/18/89  Mason Sole, Pratik D, DO  Cholecalciferol (VITAMIN D3) 50 MCG (2000 UT) TABS Take 2,000 Units by mouth daily.    [provider]  cyanocobalamin  (VITAMIN B12) 1000 MCG tablet Take 1,000 mcg by mouth daily.    [provider]  diltiazem  (CARDIZEM  CD) 180 MG 24 hr capsule Take 180 mg by mouth 2 (two) times daily.    [provider]  levothyroxine  (SYNTHROID , LEVOTHROID) 75 MCG tablet Take 75 mcg by mouth daily before breakfast.    [provider]  lidocaine  (LIDODERM ) 5 % Place 1 patch onto the skin daily. Remove & Discard patch within 12 hours or as directed by MD 12/06/23   Jerilynn Montenegro, MD  magnesium  oxide (MAG-OX) 400 (240 Mg) MG tablet Take 400 mg by mouth 2 (two) times daily.    [provider]  Melatonin 10  MG TABS Take 10 mg by mouth at bedtime.    [provider]  nitroGLYCERIN  (NITROSTAT ) 0.4 MG SL tablet Place 0.4 mg under the tongue every 5 (five) minutes as needed. For chest pains. May repeat for up to 3 doses.    [provider]  omeprazole  (PRILOSEC) 40 MG capsule TAKE ONE CAPSULE BY MOUTH EVERY DAY 11/12/23   Umberto Ganong, Bearl Limes, MD  Probiotic Product (ALIGN) 4 MG CAPS Take 4 mg by mouth in the morning.    [provider]  traZODone  (DESYREL ) 50 MG tablet Take 50 mg by mouth at bedtime.    [provider]  Vedolizumab  (ENTYVIO  IV) Inject into the vein. Every 8 weeks.    [provider]  warfarin (COUMADIN ) 2.5 MG tablet TAKE 1 TABLET TO 1 1/2 TABLETS BY MOUTH DAILY OR AS DIRECTED BY COUMADIN  CLINIC Patient taking differently: Take 2.5 mg by mouth See admin instructions. TAKE 1 TABLET TO 1 1/2 TABLETS BY MOUTH DAILY OR AS DIRECTED BY COUMADIN  CLINIC 10/11/23   Gerard Knight, MD      Allergies    Patient has no known allergies.    Review of Systems   Review of Systems  Gastrointestinal:  Positive for diarrhea.    Physical Exam Updated Vital Signs BP (!) 125/50   Pulse 61   Temp 98.1 F (36.7 C) (Oral)  Resp 20   Ht 5\' 8"  (1.727 m)   Wt 72.7 kg   SpO2 98%   BMI 24.37 kg/m  Physical Exam Vitals and nursing note reviewed.  Constitutional:      Appearance: He is well-developed.  HENT:     Head: Normocephalic and atraumatic.  Cardiovascular:     Rate and Rhythm: Normal rate.  Pulmonary:     Effort: Pulmonary effort is normal. No respiratory distress.  Abdominal:     General: There is no distension.     Tenderness: There is abdominal tenderness (RUQ/RLQ).     Comments: Diminished frequency but some high pitched bowel sounds.  Mild distension  Musculoskeletal:        General: Normal range of motion.     Cervical back: Normal range of motion.  Neurological:     Mental Status: He is alert.     ED Results /  Procedures / Treatments   Labs (all labs ordered are listed, but only abnormal results are displayed) Labs Reviewed  CBC WITH DIFFERENTIAL/PLATELET - Abnormal; Notable for the following components:      Result Value   RBC 4.20 (*)    HCT 37.5 (*)    All other components within normal limits  I-STAT CHEM 8, ED - Abnormal; Notable for the following components:   Creatinine, Ser 1.50 (*)    Glucose, Bld 120 (*)    Hemoglobin 12.6 (*)    HCT 37.0 (*)    All other components within normal limits  GASTROINTESTINAL PANEL BY PCR, STOOL (REPLACES STOOL CULTURE)  C DIFFICILE QUICK SCREEN W PCR REFLEX    COMPREHENSIVE METABOLIC PANEL WITH GFR  LIPASE, BLOOD  PROTIME-INR  LACTIC ACID, PLASMA  LACTIC ACID, PLASMA    EKG EKG Interpretation Date/Time:  Wednesday Feb 27 2024 05:26:36 EDT Ventricular Rate:  68 PR Interval:    QRS Duration:  86 QT Interval:  423 QTC Calculation: 460 R Axis:   -85  Text Interpretation: Atrial fibrillation Left anterior fascicular block Low voltage, extremity and precordial leads Confirmed by Eve Hinders [phone]) on 02/27/2024 5:57:53 AM  Radiology CT ABDOMEN PELVIS W CONTRAST Result Date: 02/27/2024 CLINICAL DATA:  Abdominal pain, diarrhea, and weakness. EXAM: CT ABDOMEN AND PELVIS WITH CONTRAST TECHNIQUE: Multidetector CT imaging of the abdomen and pelvis was performed using the standard protocol following bolus administration of intravenous contrast. RADIATION DOSE REDUCTION: This exam was performed according to the departmental dose-optimization program which includes automated exposure control, adjustment of the mA and/or kV according to patient size and/or use of iterative reconstruction technique. CONTRAST:  100mL OMNIPAQUE  IOHEXOL  300 MG/ML  SOLN COMPARISON:  Numerous prior CTs back to 2009. The 2 most recent are both with contrast and dated 12/06/2023 and 12/22/2022. FINDINGS: Lower chest: There is scarring in the medial right lung base, linear atelectasis  in the posterior left lower lobe. Lung bases are clear of infiltrates. Hepatobiliary: Chronic 8 mm too small to characterize hypodensity is unchanged in hepatic segment 6, probable small cyst. Remainder of liver is unremarkable. Gallbladder is absent without bile duct dilatation. Pancreas: No abnormality. Spleen: Stable 2.4 cm splenic cyst.  Otherwise unremarkable spleen. Adrenals/Urinary Tract: No adrenal mass. There is bilateral renal cortical thinning, scattered scarring on the left. There are few small bilateral renal cysts requiring no follow-up imaging. No mass enhancement is seen. There is no urinary stone or obstruction. There is mild chronic bladder thickening and trabeculation, small scattered diverticula. Findings likely due to chronic bladder outlet obstruction. No  bladder mass is seen. Stomach/Bowel: Small hiatal hernia. Unremarkable contracted stomach. Beginning at the ligament of Treitz, the small bowel is diffusely dilated up to 4 cm down to a distal ileal surgical anastomotic staple line in the anterior right upper to mid abdomen, past which the the more distal terminal ileum is collapsed. I do not see further areas of transition. Some of the right lower abdominal segments demonstrate mild fold thickening and adjacent edema but do not show pneumatosis. There are stones within a normal caliber appendix. The colon wall is normal in thickness. There is left-sided diverticulosis with no evidence of diverticulitis. Vascular/Lymphatic: Moderate aortoiliac calcific plaques. Infrarenal aortic ectasia again measures 2.5 cm. No other significant vascular findings.  No adenopathy. Reproductive: Slightly prominent prostate with TURP defect. Other: Bilateral inguinal fat hernias. Trace ascites in the right abdominal mesenteric folds. No free air. Musculoskeletal: Osteopenia with degenerative changes of the spine. Bridging osteophytes left SI joint. Bilateral hip DJD. No acute or other significant osseous  findings. IMPRESSION: 1. Diffusely dilated small bowel up to 4 cm down to a distal ileal surgical anastomotic staple line in the anterior right upper to mid abdomen, past which the more distal terminal ileum is collapsed. Findings are consistent with small bowel obstruction. 2. Some of the right lower abdominal segments demonstrate mild fold thickening and adjacent edema but do not show pneumatosis. Early ischemia not excluded. 3. Trace ascites in the right abdominal mesenteric folds. No free air. 4. Diverticulosis without evidence of diverticulitis. 5. Aortic and coronary artery atherosclerosis. 6. Chronic bladder thickening and trabeculation with small diverticula, likely due to chronic bladder outlet obstruction. Query underlying cystitis. 7. Small hiatal hernia.  Bilateral inguinal fat hernias. 8. Osteopenia and degenerative change. Aortic Atherosclerosis (ICD10-I70.0). Electronically Signed   By: Denman Fischer M.D.   On: 02/27/2024 06:42    Procedures Procedures    Medications Ordered in ED Medications  lactated ringers  infusion (has no administration in time range)  lactated ringers  bolus 1,000 mL (0 mLs Intravenous Stopped 02/27/24 0651)  ondansetron  (ZOFRAN ) injection 4 mg (4 mg Intravenous Given 02/27/24 0550)  iohexol  (OMNIPAQUE ) 300 MG/ML solution 100 mL (100 mLs Intravenous Contrast Given 02/27/24 0606)    ED Course/ Medical Decision Making/ A&P                                 Medical Decision Making Amount and/or Complexity of Data Reviewed Labs: ordered. Radiology: ordered.  Risk Prescription drug management. Decision regarding hospitalization.   CT scan with e/o SBO at anastomosis site. Non peritonitic. La ok. D/w Dr. Cherilyn Corn given our current limitations on OR and feels unlikely surgical at this point, recommends holding coumadin , NGT and admit to hospitalist here for co management. Patient and spouse aware.   Final Clinical Impression(s) / ED Diagnoses Final  diagnoses:  Small bowel obstruction [facility])    Rx / DC Orders ED Discharge Orders     None         Talayla Doyel, Reymundo Caulk, MD 02/27/24 [phone]

## 2024-02-27 NOTE — ED Notes (Signed)
 ED Provider at bedside.

## 2024-02-27 NOTE — Consult Note (Addendum)
 [facility] Consult  Reason for Consult: Small bowel obstruction Referring Physician: Dr. Luberta Ruse  Chief Complaint   Diarrhea     HPI: [name] is a 89 y.o. male who presents with a 1 day history of nausea, vomiting, and diarrhea.  He states that this episode started yesterday.  He was still not feeling well this morning, which prompted him to present to the emergency department.  He denies any abdominal pain associated with this episode.  He last had a bowel movement early this morning, and last passed flatus in the emergency department.  His past medical history significant for atrial fibrillation on warfarin with his last dose being yesterday, coronary artery disease, Crohn's on Entyvio , GERD, and small bowel obstruction.  Patient states that he was last in the hospital for small bowel obstruction several years ago at [facility].  They placed an NG tube, and patient improved, though he is unsure of the full details of this admission.  His surgical history significant for laparoscopic cholecystectomy many years ago and an exploratory laparotomy with small bowel resection and anastomosis related to his Crohn's diagnosis.  Per EMR, the surgery may have been related to a small bowel obstruction.  He follows with Dr. 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  He has not started treatments for this as he was supposed to undergo a PET scan.  Upon evaluation in the ED, the patient was noted to be hemodynamically stable.  He had no leukocytosis, and his INR was 2.8.  He underwent a CT of the abdomen and pelvis which demonstrated diffusely dilated small bowel up to 4 cm down to the distal ileal surgical anastomosis staple line in the anterior right upper to mid abdomen, past which the more distal terminal ileum is collapsed consistent with small bowel obstruction, in addition to some right lower abdominal segments with  mild fold thickening and adjacent edema.  Past Medical History:  Diagnosis Date   Anemia    Atrial fibrillation (HCC)    BPH (benign prostatic hyperplasia)    CAD (coronary artery disease)    Catheterization 2004, mild/moderate nonobstructive disease  /   nuclear, 2007, small inferior scar // no ischemia   Crohn's disease (HCC)    Elevated PSA    GERD (gastroesophageal reflux disease)    History of kidney stones    History of pneumonia    Hypothyroidism    Mitral regurgitation    Pneumonia    SBO (small bowel obstruction) (HCC)    Urinary retention    Wears glasses     Past Surgical History:  Procedure Laterality Date   AGILE CAPSULE  10/18/2011   Procedure: AGILE CAPSULE;  Surgeon: Ruby Corporal, MD;  [facility];  Service: Endoscopy;  Laterality: N/A;  730   BIOPSY  10/05/2022   Procedure: BIOPSY;  Surgeon: Urban Garden, MD;  [facility];  Service: Gastroenterology;;   BIOPSY  01/02/2023   Procedure: BIOPSY;  Surgeon: Urban Garden, MD;  [facility];  Service: Gastroenterology;;   CARDIAC CATHETERIZATION  2004   CATARACT EXTRACTION W/PHACO Right 06/27/2021   Procedure: CATARACT EXTRACTION PHACO AND INTRAOCULAR LENS PLACEMENT (IOC);  Surgeon: Tarri Farm, MD;  Location: [facility];  Service: Ophthalmology;  Laterality: Right;  CDE 21.98   CATARACT EXTRACTION W/PHACO Left 07/11/2021   Procedure: CATARACT EXTRACTION PHACO AND INTRAOCULAR LENS PLACEMENT LEFT EYE;  Surgeon: Adora Hoover,  Royston Cornea, MD;  Location: [facility];  Service: Ophthalmology;  Laterality: Left;  CDE=10.42   CHOLECYSTECTOMY  2010   Dr. DeMason   COLONOSCOPY  2008   DeMason   COLONOSCOPY N/A 03/23/2016   Procedure: COLONOSCOPY;  Surgeon: Ruby Corporal, MD;  [facility];  Service: Endoscopy;  Laterality: N/A;  1:00   CYSTOSCOPY WITH INSERTION OF UROLIFT     ESOPHAGEAL BRUSHING  10/05/2022   Procedure: ESOPHAGEAL BRUSHING;  Surgeon: Urban Garden, MD;  [facility];  Service: Gastroenterology;;   ESOPHAGOGASTRODUODENOSCOPY  11/08/2011   Procedure: ESOPHAGOGASTRODUODENOSCOPY (EGD);  Surgeon: Ruby Corporal, MD;  [facility];  Service: Endoscopy;  Laterality: N/A;  300   ESOPHAGOGASTRODUODENOSCOPY N/A 02/15/2024   Procedure: EGD (ESOPHAGOGASTRODUODENOSCOPY);  Surgeon: Umberto Ganong, Bearl Limes, MD;  [facility];  Service: Gastroenterology;  Laterality: N/A;  2:00PM;ASA 3   ESOPHAGOGASTRODUODENOSCOPY (EGD) WITH PROPOFOL  N/A 10/05/2022   Procedure: ESOPHAGOGASTRODUODENOSCOPY (EGD) WITH PROPOFOL ;  Surgeon: Urban Garden, MD;  [facility];  Service: Gastroenterology;  Laterality: N/A;   ESOPHAGOGASTRODUODENOSCOPY (EGD) WITH PROPOFOL  N/A 01/02/2023   Procedure: ESOPHAGOGASTRODUODENOSCOPY (EGD) WITH PROPOFOL ;  Surgeon: Urban Garden, MD;  [facility];  Service: Gastroenterology;  Laterality: N/A;  1:30 pm, asa 3   HEMOSTASIS CLIP PLACEMENT  10/05/2022   Procedure: HEMOSTASIS CLIP PLACEMENT;  Surgeon: Urban Garden, MD;  [facility];  Service: Gastroenterology;;   POLYPECTOMY  03/23/2016   Procedure: POLYPECTOMY;  Surgeon: Ruby Corporal, MD;  [facility];  Service: Endoscopy;;  Cecal polyp removed via cold forceps recto-sigmoid polyp removed via cold snare   POLYPECTOMY  10/05/2022   Procedure: POLYPECTOMY;  Surgeon: Urban Garden, MD;  [facility];  Service: Gastroenterology;;   TRANSURETHRAL RESECTION OF PROSTATE N/A 04/29/2020   Procedure: TRANSURETHRAL RESECTION OF THE PROSTATE (TURP);  Surgeon: Trent Frizzle, MD;  Location: [facility];  Service: Urology;  Laterality: N/A;  90 MINS    Family History  Problem Relation Age of Onset   Colon cancer Mother    Heart disease Father    Stroke Brother    Healthy Daughter     Social History   Tobacco Use   Smoking status: Never     Passive exposure: Never   Smokeless tobacco: Never  Vaping Use   Vaping status: Never Used  Substance Use Topics   Alcohol  use: No    Alcohol /week: 0.0 standard drinks of alcohol    Drug use: No    Medications: I have reviewed the patient's current medications.  No Known Allergies   ROS:  Pertinent items are noted in HPI.  Blood pressure 132/72, pulse 92, temperature 98.2 F (36.8 C), resp. rate 17, height 5\' 8"  (1.727 m), weight 72.7 kg, SpO2 95%. Physical Exam Vitals reviewed.  Constitutional:      Appearance: Normal appearance.  HENT:     Head: Normocephalic and atraumatic.     Nose:     Comments: NG tube in place with minimal gastric contents in canister Eyes:     Extraocular Movements: Extraocular movements intact.     Pupils: Pupils are equal, round, and reactive to light.  Cardiovascular:     Rate and Rhythm: Normal rate.  Pulmonary:     Effort: Pulmonary effort is normal.  Abdominal:     Comments: Abdomen soft, mild distention, no percussion tenderness, nontender palpation; no rigidity, guarding, rebound tenderness  Musculoskeletal:  General: Normal range of motion.     Cervical back: Normal range of motion.  Skin:    General: Skin is warm and dry.  Neurological:     General: No focal deficit present.     Mental Status: He is alert and oriented to person, place, and time.  Psychiatric:        Behavior: Behavior normal.     Results: Results for orders placed or performed during the hospital encounter of 02/27/24 (from the past 48 hours)  CBC with Differential     Status: Abnormal   Collection Time: 02/27/24  5:37 AM  Result Value Ref Range   WBC 8.3 4.0 - 10.5 K/uL   RBC 4.20 (L) 4.22 - 5.81 MIL/uL   Hemoglobin 13.2 13.0 - 17.0 g/dL   HCT 16.1 (L) 09.6 - 04.5 %   MCV 89.3 80.0 - 100.0 fL   MCH 31.4 26.0 - 34.0 pg   MCHC 35.2 30.0 - 36.0 g/dL   RDW 40.9 81.1 - 91.4 %   Platelets 159 150 - 400 K/uL   nRBC 0.0 0.0 - 0.2 %   Neutrophils  Relative % 72 %   Neutro Abs 6.0 1.7 - 7.7 K/uL   Lymphocytes Relative 18 %   Lymphs Abs 1.5 0.7 - 4.0 K/uL   Monocytes Relative 7 %   Monocytes Absolute 0.6 0.1 - 1.0 K/uL   Eosinophils Relative 2 %   Eosinophils Absolute 0.1 0.0 - 0.5 K/uL   Basophils Relative 1 %   Basophils Absolute 0.1 0.0 - 0.1 K/uL   Immature Granulocytes 0 %   Abs Immature Granulocytes 0.03 0.00 - 0.07 K/uL    Comment: Performed at [facility], [address] [place], [place] [address]  Comprehensive metabolic panel     Status: Abnormal   Collection Time: 02/27/24  5:37 AM  Result Value Ref Range   Sodium 138 135 - 145 mmol/L   Potassium 3.9 3.5 - 5.1 mmol/L   Chloride 106 98 - 111 mmol/L   CO2 24 22 - 32 mmol/L   Glucose, Bld 122 (H) 70 - 99 mg/dL    Comment: Glucose reference range applies only to samples taken after fasting for at least 8 hours.   BUN 19 8 - 23 mg/dL   Creatinine, Ser 6.21 (H) 0.61 - 1.24 mg/dL   Calcium 9.0 8.9 - 30.8 mg/dL   Total Protein 6.0 (L) 6.5 - 8.1 g/dL   Albumin 3.5 3.5 - 5.0 g/dL   AST 20 15 - 41 U/L   ALT 15 0 - 44 U/L   Alkaline Phosphatase 49 38 - 126 U/L   Total Bilirubin 1.0 0.0 - 1.2 mg/dL   GFR, Estimated 42 (L) >60 mL/min    Comment: (NOTE) Calculated using the CKD-EPI Creatinine Equation (2021)    Anion gap 8 5 - 15    Comment: Performed at [facility], [address], [place], [place] [address]  Lipase, blood     Status: None   Collection Time: 02/27/24  5:37 AM  Result Value Ref Range   Lipase 38 11 - 51 U/L    Comment: Performed at [facility], [address] [place], [place] [address]  I-stat chem 8, ED (not at [facility], [facility] or [facility])     Status: Abnormal   Collection Time: 02/27/24  5:55 AM  Result Value Ref Range   Sodium 139 135 - 145 mmol/L   Potassium 3.8 3.5 - 5.1  mmol/L   Chloride 104 98 - 111 mmol/L   BUN 20 8 - 23 mg/dL   Creatinine, Ser 1.61 (H) 0.61 - 1.24 mg/dL   Glucose, Bld 096 (H) 70 - 99 mg/dL    Comment: Glucose  reference range applies only to samples taken after fasting for at least 8 hours.   Calcium, Ion 1.15 1.15 - 1.40 mmol/L   TCO2 22 22 - 32 mmol/L   Hemoglobin 12.6 (L) 13.0 - 17.0 g/dL   HCT 04.5 (L) 40.9 - 81.1 %  Protime-INR     Status: Abnormal   Collection Time: 02/27/24  7:08 AM  Result Value Ref Range   Prothrombin Time 29.5 (H) 11.4 - 15.2 seconds   INR 2.8 (H) 0.8 - 1.2    Comment: (NOTE) INR goal varies based on device and disease states. Performed at [facility], [address], [place], [place] [address]   Lactic acid, plasma     Status: None   Collection Time: 02/27/24  7:08 AM  Result Value Ref Range   Lactic Acid, Venous 1.5 0.5 - 1.9 mmol/L    Comment: Performed at [facility], [address] [place], [place] [address]  Lactic acid, plasma     Status: None   Collection Time: 02/27/24  8:43 AM  Result Value Ref Range   Lactic Acid, Venous 1.0 0.5 - 1.9 mmol/L    Comment: Performed at [facility], [address], [place], [place] [address]    DG Chest Portable 1 View Result Date: 02/27/2024 EXAM: 1 VIEW XRAY OF THE CHEST 02/27/2024 08:02:00 AM COMPARISON: 12/06/2023 chest radiograph. CLINICAL HISTORY: NG placement. Pt states he had multiple episodes of diarrhea yesterday and today; pt states he feels very weak and is unable to walk; Pt c/o nausea but no vomiting; Pt had 2 doses of pepto bismol yesterday. Hx of afib, CAD, pneumonia. Non smoker. FINDINGS: LUNGS AND PLEURA: No consolidation. No pulmonary edema. No pleural effusion. No pneumothorax. HEART AND MEDIASTINUM: No acute abnormality of the cardiac and mediastinal silhouettes. BONES AND SOFT TISSUES: No acute osseous abnormality. LINES AND TUBES: Enteric tube tip just below the esophagogastric junction with side port in the lower thoracic esophagus. Recommended advancing 8 cm. SOFT TISSUES: Surgical clips in the right upper quadrant of the abdomen. IMPRESSION: 1. Enteric tube tip just below the  esophagogastric junction with side port in the lower thoracic esophagus. Recommend advancing 8 cm. 2. No active cardiopulmonary disease. Electronically signed by: Karlyn Overman MD 02/27/2024 08:20 AM EDT RP Workstation: [id number]   CT ABDOMEN PELVIS W CONTRAST Result Date: 02/27/2024 CLINICAL DATA:  Abdominal pain, diarrhea, and weakness. EXAM: CT ABDOMEN AND PELVIS WITH CONTRAST TECHNIQUE: Multidetector CT imaging of the abdomen and pelvis was performed using the standard protocol following bolus administration of intravenous contrast. RADIATION DOSE REDUCTION: This exam was performed according to the departmental dose-optimization program which includes automated exposure control, adjustment of the mA and/or kV according to patient size and/or use of iterative reconstruction technique. CONTRAST:  100mL OMNIPAQUE  IOHEXOL  300 MG/ML  SOLN COMPARISON:  Numerous prior CTs back to 2009. The 2 most recent are both with contrast and dated 12/06/2023 and 12/22/2022. FINDINGS: Lower chest: There is scarring in the medial right lung base, linear atelectasis in the posterior left lower lobe. Lung bases are clear of infiltrates. Hepatobiliary: Chronic 8 mm too small to characterize hypodensity is unchanged in hepatic segment 6, probable small cyst. Remainder of liver is unremarkable. Gallbladder is  absent without bile duct dilatation. Pancreas: No abnormality. Spleen: Stable 2.4 cm splenic cyst.  Otherwise unremarkable spleen. Adrenals/Urinary Tract: No adrenal mass. There is bilateral renal cortical thinning, scattered scarring on the left. There are few small bilateral renal cysts requiring no follow-up imaging. No mass enhancement is seen. There is no urinary stone or obstruction. There is mild chronic bladder thickening and trabeculation, small scattered diverticula. Findings likely due to chronic bladder outlet obstruction. No bladder mass is seen. Stomach/Bowel: Small hiatal hernia. Unremarkable contracted stomach.  Beginning at the ligament of Treitz, the small bowel is diffusely dilated up to 4 cm down to a distal ileal surgical anastomotic staple line in the anterior right upper to mid abdomen, past which the the more distal terminal ileum is collapsed. I do not see further areas of transition. Some of the right lower abdominal segments demonstrate mild fold thickening and adjacent edema but do not show pneumatosis. There are stones within a normal caliber appendix. The colon wall is normal in thickness. There is left-sided diverticulosis with no evidence of diverticulitis. Vascular/Lymphatic: Moderate aortoiliac calcific plaques. Infrarenal aortic ectasia again measures 2.5 cm. No other significant vascular findings.  No adenopathy. Reproductive: Slightly prominent prostate with TURP defect. Other: Bilateral inguinal fat hernias. Trace ascites in the right abdominal mesenteric folds. No free air. Musculoskeletal: Osteopenia with degenerative changes of the spine. Bridging osteophytes left SI joint. Bilateral hip DJD. No acute or other significant osseous findings. IMPRESSION: 1. Diffusely dilated small bowel up to 4 cm down to a distal ileal surgical anastomotic staple line in the anterior right upper to mid abdomen, past which the more distal terminal ileum is collapsed. Findings are consistent with small bowel obstruction. 2. Some of the right lower abdominal segments demonstrate mild fold thickening and adjacent edema but do not show pneumatosis. Early ischemia not excluded. 3. Trace ascites in the right abdominal mesenteric folds. No free air. 4. Diverticulosis without evidence of diverticulitis. 5. Aortic and coronary artery atherosclerosis. 6. Chronic bladder thickening and trabeculation with small diverticula, likely due to chronic bladder outlet obstruction. Query underlying cystitis. 7. Small hiatal hernia.  Bilateral inguinal fat hernias. 8. Osteopenia and degenerative change. Aortic Atherosclerosis (ICD10-I70.0).  Electronically Signed   By: Denman Fischer M.D.   On: 02/27/2024 06:42     Assessment & Plan:  [name] is a 89 y.o. male who was admitted with a small bowel obstruction.  CT is demonstrating concern for small bowel obstruction with transition point at his previous anastomotic site.  Imaging and blood work evaluated by myself.  -Upon review the imaging, I do see the transition at the previous anastomotic staple line.  Given that he had a small bowel movement this morning and was still passing a little bit of flatus, will attempt conservative measures.  Also would like to attempt conservative measures given the patient's age, and his other medical comorbidities such as his recently diagnosed carcinoid of the duodenum and A-fib on warfarin -NPO, NG to LIS -IVF per primary team -Monitor for return of bowel function -Dulcolax suppository ordered -Hold warfarin.  Will need to check daily INRs -Will likely plan for small bowel obstruction protocol in the next couple of days pending how his INR -No acute surgical intervention at this time.  Discussed with the patient that he may need to undergo surgery pending how he progresses - Appreciate hospitalist and GI recommendations  All questions were answered to the satisfaction of the patient.  Note: Portions of this report  may have been transcribed using voice recognition software. Every effort has been made to ensure accuracy; however, inadvertent computerized transcription errors may still be present.   -- Lidia Reels, DO [facility] [address] [place], [place] [address] [phone] (office)

## 2024-02-27 NOTE — Progress Notes (Signed)
   02/27/24 1820  TOC Brief Assessment  Insurance and Status Reviewed  Patient has primary care physician Yes  Home environment has been reviewed From home c/wife  Prior level of function: Independent  Prior/Current Home Services No current home services  Social Drivers of Health Review SDOH reviewed no interventions necessary  Readmission risk has been reviewed Yes  Transition of care needs no transition of care needs at this time   Pt independent at home, drives himself, has cane, walker, rollator, grab bars.   Transition of Care Department [facility]) has reviewed patient and no other TOC needs have been identified at this time. We will continue to monitor patient advancement through interdisciplinary progression rounds. If new patient needs arise, please place a TOC consult.

## 2024-02-27 NOTE — H&P (Signed)
 History and Physical    Patient: [name] GUY:[id number] DOB: 1931-03-18 DOA: 02/27/2024 DOS: the patient was seen and examined on 02/27/2024 PCP: Orest Bio, MD  Patient coming from: Home  Chief Complaint:  Chief Complaint  Patient presents with   Diarrhea   HPI: [name] is a 89 year old male with a history of permanent atrial fibrillation on warfarin, nonobstructive CAD, chronic disease with chronic stricture, carcinoid tumor, GERD, hypothyroidism, and B12 deficiency presenting with nausea, abdominal pain, and diarrhea.  The patient had been in his usual state of health before developing nausea and diarrhea on 02/26/2024.  He had 5 loose bowel movements on 02/25/2024 without any blood.  After he took Pepto-Bismol x 2, he noted black stools.  He had 1 bowel movement that was loose prior to coming to the emergency room in the early morning 02/27/2024.  He is passing flatus presently.  He denies any new medications.  He denies any fevers but has some subjective chills.  He denies any headache, neck pain, chest pain, shortness of breath, coughing, hemoptysis.  There is no hematochezia.  There is no dysuria or hematuria. Notably, the patient had a recent EGD performed on 02/15/2024 which showed a 2 cm hiatal hernia, and a submucosal nodule in the duodenum NET.  Since then, he has restarted his warfarin.  His last warfarin dose was on the evening of 02/26/2024.  Because of worsening abdominal pain and distention and loose stool, he presented for further evaluation and treatment. In the ED, the patient was afebrile hemodynamically stable with oxygen  saturation 97% room air.  WBC 8.3, hemoglobin 13.2, platelets 159.  Sodium 139, potassium 3.8, bicarbonate 22, serum creatinine 1.50.  General surgery was consulted.  They recommended NG tube and holding warfarin for now.   Review of Systems: As mentioned in the history of present illness. All other systems reviewed and are negative. Past Medical  History:  Diagnosis Date   Anemia    Atrial fibrillation (HCC)    BPH (benign prostatic hyperplasia)    CAD (coronary artery disease)    Catheterization 2004, mild/moderate nonobstructive disease  /   nuclear, 2007, small inferior scar // no ischemia   Crohn's disease (HCC)    Elevated PSA    GERD (gastroesophageal reflux disease)    History of kidney stones    History of pneumonia    Hypothyroidism    Mitral regurgitation    Pneumonia    SBO (small bowel obstruction) (HCC)    Urinary retention    Wears glasses    Past Surgical History:  Procedure Laterality Date   AGILE CAPSULE  10/18/2011   Procedure: AGILE CAPSULE;  Surgeon: Ruby Corporal, MD;  [facility];  Service: Endoscopy;  Laterality: N/A;  730   BIOPSY  10/05/2022   Procedure: BIOPSY;  Surgeon: Urban Garden, MD;  [facility];  Service: Gastroenterology;;   BIOPSY  01/02/2023   Procedure: BIOPSY;  Surgeon: Urban Garden, MD;  [facility]: [facility];  Service: Gastroenterology;;   CARDIAC CATHETERIZATION  2004   CATARACT EXTRACTION W/PHACO Right 06/27/2021   Procedure: CATARACT EXTRACTION PHACO AND INTRAOCULAR LENS PLACEMENT (IOC);  Surgeon: Tarri Farm, MD;  Location: [facility];  Service: Ophthalmology;  Laterality: Right;  CDE 21.98   CATARACT EXTRACTION W/PHACO Left 07/11/2021   Procedure: CATARACT EXTRACTION PHACO AND INTRAOCULAR LENS PLACEMENT LEFT EYE;  Surgeon: Tarri Farm, MD;  Location: [facility];  Service: Ophthalmology;  Laterality: Left;  CDE=10.42   CHOLECYSTECTOMY  2010   Dr. DeMason   COLONOSCOPY  2008   DeMason   COLONOSCOPY N/A 03/23/2016   Procedure: COLONOSCOPY;  Surgeon: Ruby Corporal, MD;  [facility];  Service: Endoscopy;  Laterality: N/A;  1:00   CYSTOSCOPY WITH INSERTION OF UROLIFT     ESOPHAGEAL BRUSHING  10/05/2022   Procedure: ESOPHAGEAL BRUSHING;  Surgeon: Urban Garden, MD;  [facility]: [facility];  Service:  Gastroenterology;;   ESOPHAGOGASTRODUODENOSCOPY  11/08/2011   Procedure: ESOPHAGOGASTRODUODENOSCOPY (EGD);  Surgeon: Ruby Corporal, MD;  [facility];  Service: Endoscopy;  Laterality: N/A;  300   ESOPHAGOGASTRODUODENOSCOPY N/A 02/15/2024   Procedure: EGD (ESOPHAGOGASTRODUODENOSCOPY);  Surgeon: Umberto Ganong, Bearl Limes, MD;  [facility];  Service: Gastroenterology;  Laterality: N/A;  2:00PM;ASA 3   ESOPHAGOGASTRODUODENOSCOPY (EGD) WITH PROPOFOL  N/A 10/05/2022   Procedure: ESOPHAGOGASTRODUODENOSCOPY (EGD) WITH PROPOFOL ;  Surgeon: Urban Garden, MD;  [facility];  Service: Gastroenterology;  Laterality: N/A;   ESOPHAGOGASTRODUODENOSCOPY (EGD) WITH PROPOFOL  N/A 01/02/2023   Procedure: ESOPHAGOGASTRODUODENOSCOPY (EGD) WITH PROPOFOL ;  Surgeon: Urban Garden, MD;  [facility];  Service: Gastroenterology;  Laterality: N/A;  1:30 pm, asa 3   HEMOSTASIS CLIP PLACEMENT  10/05/2022   Procedure: HEMOSTASIS CLIP PLACEMENT;  Surgeon: Urban Garden, MD;  [facility];  Service: Gastroenterology;;   POLYPECTOMY  03/23/2016   Procedure: POLYPECTOMY;  Surgeon: Ruby Corporal, MD;  [facility];  Service: Endoscopy;;  Cecal polyp removed via cold forceps recto-sigmoid polyp removed via cold snare   POLYPECTOMY  10/05/2022   Procedure: POLYPECTOMY;  Surgeon: Urban Garden, MD;  [facility];  Service: Gastroenterology;;   TRANSURETHRAL RESECTION OF PROSTATE N/A 04/29/2020   Procedure: TRANSURETHRAL RESECTION OF THE PROSTATE (TURP);  Surgeon: Trent Frizzle, MD;  Location: [facility];  Service: Urology;  Laterality: N/A;  57 MINS   Social History:  reports that he has never smoked. He has never been exposed to tobacco smoke. He has never used smokeless tobacco. He reports that he does not drink alcohol  and does not use drugs.  No Known Allergies  Family History  Problem  Relation Age of Onset   Colon cancer Mother    Heart disease Father    Stroke Brother    Healthy Daughter     Prior to Admission medications   Medication Sig Start Date End Date Taking? Authorizing Provider  acetaminophen  (TYLENOL ) 500 MG tablet Take 1,000 mg by mouth every 6 (six) hours as needed for mild pain (pain score 1-3) or moderate pain (pain score 4-6).    [provider]  CALCIUM PO Take 300 mg by mouth in the morning and at bedtime.    [provider]  carvedilol  (COREG ) 12.5 MG tablet Take 1 tablet (12.5 mg total) by mouth 2 (two) times daily. 10/10/22 03/18/89  Mason Sole, Pratik D, DO  Cholecalciferol (VITAMIN D3) 50 MCG (2000 UT) TABS Take 2,000 Units by mouth daily.    [provider]  cyanocobalamin  (VITAMIN B12) 1000 MCG tablet Take 1,000 mcg by mouth daily.    [provider]  diltiazem  (CARDIZEM  CD) 180 MG 24 hr capsule Take 180 mg by mouth 2 (two) times daily.    [provider]  levothyroxine  (SYNTHROID , LEVOTHROID) 75 MCG tablet Take 75 mcg by mouth daily before breakfast.    [provider]  lidocaine  (LIDODERM ) 5 % Place 1 patch onto the skin  daily. Remove & Discard patch within 12 hours or as directed by MD 12/06/23   Jerilynn Montenegro, MD  magnesium  oxide (MAG-OX) 400 (240 Mg) MG tablet Take 400 mg by mouth 2 (two) times daily.    [provider]  Melatonin 10 MG TABS Take 10 mg by mouth at bedtime.    [provider]  nitroGLYCERIN  (NITROSTAT ) 0.4 MG SL tablet Place 0.4 mg under the tongue every 5 (five) minutes as needed. For chest pains. May repeat for up to 3 doses.    [provider]  omeprazole  (PRILOSEC) 40 MG capsule TAKE ONE CAPSULE BY MOUTH EVERY DAY 11/12/23   Umberto Ganong, Bearl Limes, MD  Probiotic Product (ALIGN) 4 MG CAPS Take 4 mg by mouth in the morning.    [provider]  traZODone  (DESYREL ) 50 MG tablet Take 50 mg by mouth at bedtime.    [provider]   Vedolizumab  (ENTYVIO  IV) Inject into the vein. Every 8 weeks.    [provider]  warfarin (COUMADIN ) 2.5 MG tablet TAKE 1 TABLET TO 1 1/2 TABLETS BY MOUTH DAILY OR AS DIRECTED BY COUMADIN  CLINIC Patient taking differently: Take 2.5 mg by mouth See admin instructions. TAKE 1 TABLET TO 1 1/2 TABLETS BY MOUTH DAILY OR AS DIRECTED BY COUMADIN  CLINIC 10/11/23   Gerard Knight, MD    Physical Exam: Vitals:   02/27/24 0523 02/27/24 0525 02/27/24 0525 02/27/24 0600  BP:  (!) 147/66 (!) 147/66 (!) 125/50  Pulse:  72 68 61  Resp:  (!) 24 20 20   Temp:  98.1 F (36.7 C)    TempSrc:  Oral    SpO2:  97% 96% 98%  Weight: 72.7 kg     Height: 5\' 8"  (1.727 m)      GENERAL:  A&O x 3, NAD, well developed, cooperative, follows commands HEENT: [place]/AT, No thrush, No icterus, No oral ulcers Neck:  No neck mass, No meningismus, soft, supple CV: RRR, no S3, no S4, no rub, no JVD Lungs: Bibasilar crackles but no wheezing Abd: soft/NT +BS, mildly distended Ext: No edema, no lymphangitis, no cyanosis, no rashes Neuro:  CN II-XII intact, strength 4/5 in RUE, RLE, strength 4/5 LUE, LLE; sensation intact bilateral; no dysmetria; babinski equivocal  Data Reviewed: Data reviewed above in the history  Assessment and Plan: Partial small bowel obstruction - Patient is passing flatus - He had 1 loose bowel movement prior to coming to the emergency department in the early a.m. 02/27/2024 -02/27/2024 CT [facility]--diffusely dilated small bowel up to 4 cm down to the distal ileal surgical anastomosis; diverticulosis; chronic bladder wall thickening; mild focal thickening and adjacent edema in the right upper quadrant - General Surgery consult>> recommends NG tube decompression - Optimize electrolytes - Change medications to IV if possible  Carcinoid tumor - Patient is scheduled for PET scan - Follow-up with Dr. Cheree Cords  Permanent atrial fibrillation - Currently rate controlled - Start IV metoprolol  until able to tolerate p.o. - Holding warfarin -INR 2.8 at time of admission  CKD 3b -baseline creatinine 1.2-1.5  Crohn's disease - The patient's next dose of Entyvio  is due on 03/03/2024 - Follow-up Dr. Sammi Crick  Nonobstructive CAD - No chest pain presently  Hypothyroidism - Plan to start IV Synthroid  if remains n.p.o. for prolonged period of time  GERD - Start IV pantoprazole    Advance Care Planning: FULL  Consults: general surgery  Family Communication: spouse updated 5/14  Severity of Illness: The appropriate patient status for  this patient is INPATIENT. Inpatient status is judged to be reasonable and necessary in order to provide the required intensity of service to ensure the patient's safety. The patient's presenting symptoms, physical exam findings, and initial radiographic and laboratory data in the context of their chronic comorbidities is felt to place them at high risk for further clinical deterioration. Furthermore, it is not anticipated that the patient will be medically stable for discharge from the hospital within 2 midnights of admission.   * I certify that at the point of admission it is my clinical judgment that the patient will require inpatient hospital care spanning beyond 2 midnights from the point of admission due to high intensity of service, high risk for further deterioration and high frequency of surveillance required.*  Author: Demaris Fillers, MD 02/27/2024 7:47 AM  For on call review [email or web address].

## 2024-02-27 NOTE — Hospital Course (Addendum)
 89 year old male with a history of permanent atrial fibrillation on warfarin, nonobstructive CAD, chronic disease with chronic stricture, carcinoid tumor, GERD, hypothyroidism, and B12 deficiency presenting with nausea, abdominal pain, and diarrhea.  The patient had been in his usual state of health before developing nausea and diarrhea on 02/26/2024.  He had 5 loose bowel movements on 02/25/2024 without any blood.  After he took Pepto-Bismol x 2, he noted black stools.  He had 1 bowel movement that was loose prior to coming to the emergency room in the early morning 02/27/2024.  He is passing flatus presently.  He denies any new medications.  He denies any fevers but has some subjective chills.  He denies any headache, neck pain, chest pain, shortness of breath, coughing, hemoptysis.  There is no hematochezia.  There is no dysuria or hematuria. Notably, the patient had a recent EGD performed on 02/15/2024 which showed a 2 cm hiatal hernia, and a submucosal nodule in the duodenum NET.  Since then, he has restarted his warfarin.  His last warfarin dose was on the evening of 02/26/2024.  Because of worsening abdominal pain and distention and loose stool, he presented for further evaluation and treatment. In the ED, the patient was afebrile hemodynamically stable with oxygen  saturation 97% room air.  WBC 8.3, hemoglobin 13.2, platelets 159.  Sodium 139, potassium 3.8, bicarbonate 22, serum creatinine 1.50.  General surgery was consulted.  They recommended NG tube and holding warfarin for now.  The patient gradually improved with NG decompression.  SBFT confirmed improving SBO.  NG was removed and diet was advanced which patient tolerated.  His hospitalization was prolonged due to afib with RVR.  With adjustment of his metoprolol  and diltiazem , HR became controlled.

## 2024-02-27 NOTE — ED Triage Notes (Signed)
 Pt states he had multiple episodes of diarrhea yesterday and today; pt states he feels very weak and is unable to walk  Pt c/o nausea but no vomiting  Pt had 2 doses of pepto bismol yesterday

## 2024-02-27 NOTE — Consult Note (Signed)
 Gastroenterology Consult   Referring Provider: No ref. provider found Primary Care Physician:  Orest Bio, MD Primary Gastroenterologist:  Dr. Sammi Crick   Patient ID: [name]; [id number]; 1931-07-01   Admit date: 02/27/2024  LOS: 0 days   Date of Consultation: 02/27/2024  Reason for Consultation:  Crohn's disease with SBO   History of Present Illness   [name] is a very pleasant 89 y.o. 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 GI consulted for Further evaluation   ED course:  Afebrile, VSS WBC 8.3, Hgb 13.2 Stool studies ordered but not yet collected  CT A/P with contrast Diffusely dilated small bowel up to 4 cm down to a distal ileal surgical anastomotic staple line in the anterior right upper to mid abdomen, past which the more distal terminal ileum is collapsed. Findings are consistent with small bowel obstruction. Some of the right lower abdominal segments demonstrate mild fold thickening and adjacent edema but do not show pneumatosis. Early ischemia not excluded. Trace ascites in the right abdominal mesenteric folds. No free air. Diverticulosis without evidence of diverticulitis.  General surgery consulted and recommended NG tube placement, hold coumadin   Consult:  Patient well known to our GI team. History of SBO though last was many years ago. He reports looser stools and nausea that began on 5/13 for which he took pepto x2 with subsequent black stools. Notes he felt his usual state of health prior to that. No rectal bleeding. No real abdominal pain. Had a mushy stool this morning, very small in volume. He is Passing some flatus. No  fevers, but endorses chills. No rectal bleeding. He denies feeling bloated or like he has abdominal distention. Wife endorses he looked more bloated yesterday, slept all morning long.   Reports 1 solid BM per day at baseline. Denies episodes of constipation. No new medications for anything.   Calprotectin in October was 205, CRP 2  Last dose of Coumadin  on evenign of 5/13  Last Entyvio  dose was 3/18, was due for 7th dose yesterday which he did not get due to acute illness.   Seeing Dr. Cheree Cords regarding duodenal NET with plans for upcoming PET scan Chromagranin A on 5/12 was 1265  Last EGD: 02/2024 - 2 cm hiatal hernia.                           - Normal stomach.                           - Submucosal nodule found in the duodenum - Eroded                            NET.  Last Colonoscopy: 03/23/2016 - Two small polyps at the recto-sigmoid colon and in the cecum, removed with a cold snare. Resected and retrieved. - Mild diverticulosis in the sigmoid colon. - External and internal hemorrhoids  Past Medical History:  Diagnosis Date   Anemia    Atrial fibrillation (HCC)    BPH (benign prostatic hyperplasia)    CAD (coronary artery disease)  Catheterization 2004, mild/moderate nonobstructive disease  /   nuclear, 2007, small inferior scar // no ischemia   Crohn's disease (HCC)    Elevated PSA    GERD (gastroesophageal reflux disease)    History of kidney stones    History of pneumonia    Hypothyroidism    Mitral regurgitation    Pneumonia    SBO (small bowel obstruction) (HCC)    Urinary retention    Wears glasses     Past Surgical History:  Procedure Laterality Date   AGILE CAPSULE  10/18/2011   Procedure: AGILE CAPSULE;  Surgeon: Ruby Corporal, MD;  [facility]: [facility];  Service: Endoscopy;  Laterality: N/A;  730   BIOPSY  10/05/2022   Procedure: BIOPSY;  Surgeon: Urban Garden, MD;  [facility]: [facility];  Service: Gastroenterology;;   BIOPSY   01/02/2023   Procedure: BIOPSY;  Surgeon: Urban Garden, MD;  [facility]: [facility];  Service: Gastroenterology;;   CARDIAC CATHETERIZATION  2004   CATARACT EXTRACTION W/PHACO Right 06/27/2021   Procedure: CATARACT EXTRACTION PHACO AND INTRAOCULAR LENS PLACEMENT (IOC);  Surgeon: Tarri Farm, MD;  Location: [facility];  Service: Ophthalmology;  Laterality: Right;  CDE 21.98   CATARACT EXTRACTION W/PHACO Left 07/11/2021   Procedure: CATARACT EXTRACTION PHACO AND INTRAOCULAR LENS PLACEMENT LEFT EYE;  Surgeon: Tarri Farm, MD;  Location: [facility];  Service: Ophthalmology;  Laterality: Left;  CDE=10.42   CHOLECYSTECTOMY  2010   Dr. DeMason   COLONOSCOPY  2008   DeMason   COLONOSCOPY N/A 03/23/2016   Procedure: COLONOSCOPY;  Surgeon: Ruby Corporal, MD;  [facility]: [facility];  Service: Endoscopy;  Laterality: N/A;  1:00   CYSTOSCOPY WITH INSERTION OF UROLIFT     ESOPHAGEAL BRUSHING  10/05/2022   Procedure: ESOPHAGEAL BRUSHING;  Surgeon: Urban Garden, MD;  [facility]: [facility];  Service: Gastroenterology;;   ESOPHAGOGASTRODUODENOSCOPY  11/08/2011   Procedure: ESOPHAGOGASTRODUODENOSCOPY (EGD);  Surgeon: Ruby Corporal, MD;  [facility];  Service: Endoscopy;  Laterality: N/A;  300   ESOPHAGOGASTRODUODENOSCOPY N/A 02/15/2024   Procedure: EGD (ESOPHAGOGASTRODUODENOSCOPY);  Surgeon: Umberto Ganong, Bearl Limes, MD;  [facility];  Service: Gastroenterology;  Laterality: N/A;  2:00PM;ASA 3   ESOPHAGOGASTRODUODENOSCOPY (EGD) WITH PROPOFOL  N/A 10/05/2022   Procedure: ESOPHAGOGASTRODUODENOSCOPY (EGD) WITH PROPOFOL ;  Surgeon: Urban Garden, MD;  [facility];  Service: Gastroenterology;  Laterality: N/A;   ESOPHAGOGASTRODUODENOSCOPY (EGD) WITH PROPOFOL  N/A 01/02/2023   Procedure: ESOPHAGOGASTRODUODENOSCOPY (EGD) WITH PROPOFOL ;  Surgeon: Urban Garden, MD;  [facility];  Service: Gastroenterology;  Laterality: N/A;   1:30 pm, asa 3   HEMOSTASIS CLIP PLACEMENT  10/05/2022   Procedure: HEMOSTASIS CLIP PLACEMENT;  Surgeon: Urban Garden, MD;  [facility];  Service: Gastroenterology;;   POLYPECTOMY  03/23/2016   Procedure: POLYPECTOMY;  Surgeon: Ruby Corporal, MD;  [facility];  Service: Endoscopy;;  Cecal polyp removed via cold forceps recto-sigmoid polyp removed via cold snare   POLYPECTOMY  10/05/2022   Procedure: POLYPECTOMY;  Surgeon: Urban Garden, MD;  [facility];  Service: Gastroenterology;;   TRANSURETHRAL RESECTION OF PROSTATE N/A 04/29/2020   Procedure: TRANSURETHRAL RESECTION OF THE PROSTATE (TURP);  Surgeon: Trent Frizzle, MD;  Location: [facility];  Service: Urology;  Laterality: N/A;  90 MINS    Prior to Admission medications   Medication Sig Start Date End Date Taking? Authorizing Provider  acetaminophen  (TYLENOL ) 500 MG tablet Take  1,000 mg by mouth every 6 (six) hours as needed for mild pain (pain score 1-3) or moderate pain (pain score 4-6).    [provider]  CALCIUM PO Take 300 mg by mouth in the morning and at bedtime.    [provider]  carvedilol  (COREG ) 12.5 MG tablet Take 1 tablet (12.5 mg total) by mouth 2 (two) times daily. 10/10/22 03/18/89  Mason Sole, Pratik D, DO  Cholecalciferol (VITAMIN D3) 50 MCG (2000 UT) TABS Take 2,000 Units by mouth daily.    [provider]  cyanocobalamin  (VITAMIN B12) 1000 MCG tablet Take 1,000 mcg by mouth daily.    [provider]  diltiazem  (CARDIZEM  CD) 180 MG 24 hr capsule Take 180 mg by mouth 2 (two) times daily.    [provider]  levothyroxine  (SYNTHROID , LEVOTHROID) 75 MCG tablet Take 75 mcg by mouth daily before breakfast.    [provider]  lidocaine  (LIDODERM ) 5 % Place 1 patch onto the skin daily. Remove & Discard patch within 12 hours or as directed by MD 12/06/23   Jerilynn Montenegro, MD  magnesium  oxide (MAG-OX)  400 (240 Mg) MG tablet Take 400 mg by mouth 2 (two) times daily.    [provider]  Melatonin 10 MG TABS Take 10 mg by mouth at bedtime.    [provider]  nitroGLYCERIN  (NITROSTAT ) 0.4 MG SL tablet Place 0.4 mg under the tongue every 5 (five) minutes as needed. For chest pains. May repeat for up to 3 doses.    [provider]  omeprazole  (PRILOSEC) 40 MG capsule TAKE ONE CAPSULE BY MOUTH EVERY DAY 11/12/23   Umberto Ganong, Bearl Limes, MD  Probiotic Product (ALIGN) 4 MG CAPS Take 4 mg by mouth in the morning.    [provider]  traZODone  (DESYREL ) 50 MG tablet Take 50 mg by mouth at bedtime.    [provider]  Vedolizumab  (ENTYVIO  IV) Inject into the vein. Every 8 weeks.    [provider]  warfarin (COUMADIN ) 2.5 MG tablet TAKE 1 TABLET TO 1 1/2 TABLETS BY MOUTH DAILY OR AS DIRECTED BY COUMADIN  CLINIC Patient taking differently: Take 2.5 mg by mouth See admin instructions. TAKE 1 TABLET TO 1 1/2 TABLETS BY MOUTH DAILY OR AS DIRECTED BY COUMADIN  CLINIC 10/11/23   Gerard Knight, MD    Current Facility-Administered Medications  Medication Dose Route Frequency Provider Last Rate Last Admin   acetaminophen  (TYLENOL ) 160 MG/5ML solution 650 mg  650 mg Per Tube Q6H PRN Tat, Myrtie Atkinson, MD       Or   acetaminophen  (TYLENOL ) suppository 650 mg  650 mg Rectal Q6H PRN Demaris Fillers, MD       lactated ringers  infusion   Intravenous Continuous Mesner, Reymundo Caulk, MD 75 mL/hr at 02/27/24 0923 New Bag at 02/27/24 0923   metoprolol tartrate (LOPRESSOR) injection 2.5 mg  2.5 mg Intravenous Q6H Tat, Myrtie Atkinson, MD   2.5 mg at 02/27/24 7829   ondansetron  (ZOFRAN ) tablet 4 mg  4 mg Per Tube Q6H PRN Tat, Myrtie Atkinson, MD       Or   ondansetron  (ZOFRAN ) injection 4 mg  4 mg Intravenous Q6H PRN Tat, Myrtie Atkinson, MD       pantoprazole  (PROTONIX ) injection 40 mg  40 mg Intravenous Q12H Tat, David, MD   40 mg at 02/27/24 0941    Allergies as of 02/27/2024   (No Known Allergies)     Review of Systems   Gen: Denies any fever, chills, loss of  appetite, change in weight or weight loss CV: Denies chest pain, heart palpitations, syncope, edema  Resp: Denies shortness of breath with rest, cough, wheezing, coughing up blood, and pleurisy. GI: +looser stools, +nausea GU : Denies urinary burning, blood in urine, urinary frequency, and urinary incontinence. MS: Denies joint pain, limitation of movement, swelling, cramps, and atrophy.  Derm: Denies rash, itching, dry skin, hives. Psych: Denies depression, anxiety, memory loss, hallucinations, and confusion. Heme: Denies bruising or bleeding Neuro:  Denies any headaches, dizziness, paresthesias, shaking  Physical Exam   Vital Signs in last 24 hours: Temp:  [97.9 F (36.6 C)-98.1 F (36.7 C)] 97.9 F (36.6 C) (05/14 0904) Pulse Rate:  [61-90] 83 (05/14 0904) Resp:  [19-24] 20 (05/14 0904) BP: (125-147)/(50-83) 142/83 (05/14 0904) SpO2:  [92 %-98 %] 97 % (05/14 0904) Weight:  [72.7 kg] 72.7 kg (05/14 0523) Last BM Date : 02/27/24  General:   Alert,  Well-developed, well-nourished, pleasant and cooperative in NAD Head:  Normocephalic and atraumatic. Eyes:  Sclera clear, no icterus.   Conjunctiva pink. Ears:  Normal auditory acuity. Mouth:  No deformity or lesions, dentition normal. Neck:  Supple; no masses Lungs:  Clear throughout to auscultation.   No wheezes, crackles, or rhonchi. No acute distress. Heart:  Regular rate and rhythm; no murmurs, clicks, rubs,  or gallops. Abdomen:  Soft, mildly distended. Bowel sounds are hypoactive. Mild TTP of RLQ Msk:  Symmetrical without gross deformities. Normal posture. Extremities:  Without clubbing or edema. Neurologic:  Alert and  oriented x4. Skin:  Intact without significant lesions or rashes. Psych:  Alert and cooperative. Normal mood and affect.  Intake/Output from previous day: 05/13 0701 - 05/14 0700 In: 1000 [IV Piggyback:1000] Out: -  Intake/Output this  shift: Total I/O In: -  Out: 500 [Urine:500]   Labs/Studies   Recent Labs Recent Labs    02/27/24 0537 02/27/24 0555  WBC 8.3  --   HGB 13.2 12.6*  HCT 37.5* 37.0*  PLT 159  --    BMET Recent Labs    02/27/24 0537 02/27/24 0555  NA 138 139  K 3.9 3.8  CL 106 104  CO2 24  --   GLUCOSE 122* 120*  BUN 19 20  CREATININE 1.57* 1.50*  CALCIUM 9.0  --    LFT Recent Labs    02/25/24 1416 02/27/24 0537  PROT 7.0 6.0*  ALBUMIN 4.0 3.5  AST 20 20  ALT 16 15  ALKPHOS 59 49  BILITOT 0.6 1.0  BILIDIR 0.1  --   IBILI 0.5  --    PT/INR Recent Labs    02/27/24 0708  LABPROT 29.5*  INR 2.8*    Radiology/Studies DG Chest Portable 1 View Result Date: 02/27/2024 EXAM: 1 VIEW XRAY OF THE CHEST 02/27/2024 08:02:00 AM COMPARISON: 12/06/2023 chest radiograph. CLINICAL HISTORY: NG placement. Pt states he had multiple episodes of diarrhea yesterday and today; pt states he feels very weak and is unable to walk; Pt c/o nausea but no vomiting; Pt had 2 doses of pepto bismol yesterday. Hx of afib, CAD, pneumonia. Non smoker. FINDINGS: LUNGS AND PLEURA: No consolidation. No pulmonary edema. No pleural effusion. No pneumothorax. HEART AND MEDIASTINUM: No acute abnormality of the cardiac and mediastinal silhouettes. BONES AND SOFT TISSUES: No acute osseous abnormality. LINES AND TUBES: Enteric tube tip just below the esophagogastric junction with side port in the lower thoracic esophagus. Recommended advancing 8 cm. SOFT TISSUES: Surgical clips in the right upper quadrant of the abdomen. IMPRESSION:  1. Enteric tube tip just below the esophagogastric junction with side port in the lower thoracic esophagus. Recommend advancing 8 cm. 2. No active cardiopulmonary disease. Electronically signed by: Karlyn Overman MD 02/27/2024 08:20 AM EDT RP Workstation: [id number]   CT ABDOMEN PELVIS W CONTRAST Result Date: 02/27/2024 CLINICAL DATA:  Abdominal pain, diarrhea, and weakness. EXAM: CT ABDOMEN AND  PELVIS WITH CONTRAST TECHNIQUE: Multidetector CT imaging of the abdomen and pelvis was performed using the standard protocol following bolus administration of intravenous contrast. RADIATION DOSE REDUCTION: This exam was performed according to the departmental dose-optimization program which includes automated exposure control, adjustment of the mA and/or kV according to patient size and/or use of iterative reconstruction technique. CONTRAST:  OMNIPAQUE  IOHEXOL  300 MG/ML  SOLN COMPARISON:  Numerous prior CTs back to 2009. The 2 most recent are both with contrast and dated 12/06/2023 and 12/22/2022. FINDINGS: Lower chest: There is scarring in the medial right lung base, linear atelectasis in the posterior left lower lobe. Lung bases are clear of infiltrates. Hepatobiliary: Chronic 8 mm too small to characterize hypodensity is unchanged in hepatic segment 6, probable small cyst. Remainder of liver is unremarkable. Gallbladder is absent without bile duct dilatation. Pancreas: No abnormality. Spleen: Stable 2.4 cm splenic cyst.  Otherwise unremarkable spleen. Adrenals/Urinary Tract: No adrenal mass. There is bilateral renal cortical thinning, scattered scarring on the left. There are few small bilateral renal cysts requiring no follow-up imaging. No mass enhancement is seen. There is no urinary stone or obstruction. There is mild chronic bladder thickening and trabeculation, small scattered diverticula. Findings likely due to chronic bladder outlet obstruction. No bladder mass is seen. Stomach/Bowel: Small hiatal hernia. Unremarkable contracted stomach. Beginning at the ligament of Treitz, the small bowel is diffusely dilated up to 4 cm down to a distal ileal surgical anastomotic staple line in the anterior right upper to mid abdomen, past which the the more distal terminal ileum is collapsed. I do not see further areas of transition. Some of the right lower abdominal segments demonstrate mild fold thickening and  adjacent edema but do not show pneumatosis. There are stones within a normal caliber appendix. The colon wall is normal in thickness. There is left-sided diverticulosis with no evidence of diverticulitis. Vascular/Lymphatic: Moderate aortoiliac calcific plaques. Infrarenal aortic ectasia again measures 2.5 cm. No other significant vascular findings.  No adenopathy. Reproductive: Slightly prominent prostate with TURP defect. Other: Bilateral inguinal fat hernias. Trace ascites in the right abdominal mesenteric folds. No free air. Musculoskeletal: Osteopenia with degenerative changes of the spine. Bridging osteophytes left SI joint. Bilateral hip DJD. No acute or other significant osseous findings. IMPRESSION: 1. Diffusely dilated small bowel up to 4 cm down to a distal ileal surgical anastomotic staple line in the anterior right upper to mid abdomen, past which the more distal terminal ileum is collapsed. Findings are consistent with small bowel obstruction. 2. Some of the right lower abdominal segments demonstrate mild fold thickening and adjacent edema but do not show pneumatosis. Early ischemia not excluded. 3. Trace ascites in the right abdominal mesenteric folds. No free air. 4. Diverticulosis without evidence of diverticulitis. 5. Aortic and coronary artery atherosclerosis. 6. Chronic bladder thickening and trabeculation with small diverticula, likely due to chronic bladder outlet obstruction. Query underlying cystitis. 7. Small hiatal hernia.  Bilateral inguinal fat hernias. 8. Osteopenia and degenerative change. Aortic Atherosclerosis (ICD10-I70.0). Electronically Signed   By: Denman Fischer M.D.   On: 02/27/2024 06:42    Assessment   [name]  is a very pleasant 89 y.o. 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 GI consulted for Further evaluation   Crohn's disease with SBO:  maintained on Entyvio  for Crohn's disease since August 2024 and appeared to be in clinical remission at last GI outpatient follow up in December. Notes to be in normal state of health until onset of looser stools and nausea yesterday. No vomiting. No rectal bleeding, darker stools only after taking pepto bismol. Was due for Entyvio  dose yesterday which he did not get due to acute illness but compliant with all other doses, last in march. Minimal yellow/tan liquid output from NG tube with a few flecks of blood present in canister, likely secondary to insertion trauma. He is passing flatus and had a small BM this morning. Bowel sounds are hypoactive but present with only mild distention of his abdomen. General surgery on board as well, appreciate their input.   Of note patient had SBO back in 2020 where he underwent exploratory laparotomy for adhesions, underwent small bowel resection at that time. Unclear if current SBO is related to ongoing adhesions or sequela of his Crohn's disease. Needs Entyvio  dose as soon as he is able to get this as he was due yesterday. Could consider ileocolonoscopy in the future for further evaluation as an outpatient as long as he clinically improves this admission.    Plan / Recommendations   Maintain NG tube placement Appreciate general surgery input  3.  Up OOB as tolerated 4. NPO 5. Consider ileocolonoscopy as outpatient pending clinical improvement 6. Next Entyvio  dose as soon as he is able to obtain this    02/27/2024, 10:49 AM  Maziyah Vessel L. Sharlon Pfohl, MSN, APRN, AGNP-C Adult-Gerontology Nurse Practitioner [facility]

## 2024-02-28 DIAGNOSIS — K50012 Crohn's disease of small intestine with intestinal obstruction: Secondary | ICD-10-CM | POA: Diagnosis not present

## 2024-02-28 DIAGNOSIS — K56609 Unspecified intestinal obstruction, unspecified as to partial versus complete obstruction: Secondary | ICD-10-CM | POA: Diagnosis not present

## 2024-02-28 DIAGNOSIS — K56699 Other intestinal obstruction unspecified as to partial versus complete obstruction: Secondary | ICD-10-CM

## 2024-02-28 DIAGNOSIS — D696 Thrombocytopenia, unspecified: Secondary | ICD-10-CM | POA: Diagnosis not present

## 2024-02-28 LAB — COMPREHENSIVE METABOLIC PANEL WITH GFR
ALT: 15 U/L (ref 0–44)
AST: 18 U/L (ref 15–41)
Albumin: 3.4 g/dL — ABNORMAL LOW (ref 3.5–5.0)
Alkaline Phosphatase: 60 U/L (ref 38–126)
Anion gap: 13 (ref 5–15)
BUN: 16 mg/dL (ref 8–23)
CO2: 21 mmol/L — ABNORMAL LOW (ref 22–32)
Calcium: 8.8 mg/dL — ABNORMAL LOW (ref 8.9–10.3)
Chloride: 105 mmol/L (ref 98–111)
Creatinine, Ser: 1.53 mg/dL — ABNORMAL HIGH (ref 0.61–1.24)
GFR, Estimated: 43 mL/min — ABNORMAL LOW (ref >60)
Glucose, Bld: 82 mg/dL (ref 70–99)
Potassium: 4 mmol/L (ref 3.5–5.1)
Sodium: 139 mmol/L (ref 135–145)
Total Bilirubin: 1.7 mg/dL — ABNORMAL HIGH (ref 0.0–1.2)
Total Protein: 6.3 g/dL — ABNORMAL LOW (ref 6.5–8.1)

## 2024-02-28 LAB — CBC
HCT: 41.4 % (ref 39.0–52.0)
Hemoglobin: 14 g/dL (ref 13.0–17.0)
MCH: 30.8 pg (ref 26.0–34.0)
MCHC: 33.8 g/dL (ref 30.0–36.0)
MCV: 91 fL (ref 80.0–100.0)
Platelets: 193 10*3/uL (ref 150–400)
RBC: 4.55 MIL/uL (ref 4.22–5.81)
RDW: 13.5 % (ref 11.5–15.5)
WBC: 10.9 10*3/uL — ABNORMAL HIGH (ref 4.0–10.5)
nRBC: 0 % (ref 0.0–0.2)

## 2024-02-28 LAB — C DIFFICILE QUICK SCREEN W PCR REFLEX
C Diff antigen: NEGATIVE
C Diff interpretation: NOT DETECTED
C Diff toxin: NEGATIVE

## 2024-02-28 LAB — PROTIME-INR
INR: 2.2 — ABNORMAL HIGH (ref 0.8–1.2)
Prothrombin Time: 24.2 s — ABNORMAL HIGH (ref 11.4–15.2)

## 2024-02-28 LAB — MAGNESIUM: Magnesium: 1.7 mg/dL (ref 1.7–2.4)

## 2024-02-28 LAB — PHOSPHORUS: Phosphorus: 3.3 mg/dL (ref 2.5–4.6)

## 2024-02-28 MED ORDER — MAGNESIUM SULFATE 2 GM/50ML IV SOLN: 2.0000 g | Freq: Once | INTRAVENOUS | Status: AC

## 2024-02-28 MED ORDER — MAGNESIUM SULFATE 2 GM/50ML IV SOLN
INTRAVENOUS | Status: AC
  Administered 2024-02-28: 2 g via INTRAVENOUS
  Filled 2024-02-28: qty 50

## 2024-02-28 MED ORDER — LACTATED RINGERS IV SOLN: INTRAVENOUS | Status: AC

## 2024-02-28 MED ORDER — METOPROLOL TARTRATE 5 MG/5ML IV SOLN
5.0000 mg | Freq: Four times a day (QID) | INTRAVENOUS | Status: DC
  Administered 2024-02-28 – 2024-03-02 (×11): 5 mg via INTRAVENOUS
  Filled 2024-02-28 (×10): qty 5

## 2024-02-28 NOTE — Progress Notes (Addendum)
 Gastroenterology Progress Note   Referring Provider: No ref. provider found Primary Care Physician:  Orest Bio, MD Primary Gastroenterologist: Urban Garden, MD  Patient ID: [name]; [id number]; 01-16-1931    Subjective   Feeling okay today, is annoyed by the nasogastric tube.  Wife is at bedside.  She reports that patient told her he had some flatus this morning but no bowel movement.  Did have a bowel movement yesterday morning however has not had any output after suppository last night and this morning.  Nursing at bedside also confirms.  Denies significant abdominal pain at this time, was finally resting well just before I arrived.   Objective   Vital signs in last 24 hours Temp:  [98.2 F (36.8 C)-99 F (37.2 C)] 99 F (37.2 C) (05/15 0426) Pulse Rate:  [72-99] 72 (05/15 0426) Resp:  [17-21] 19 (05/15 0426) BP: (132-150)/(71-103) 146/98 (05/15 0426) SpO2:  [95 %-97 %] 95 % (05/15 0426) Last BM Date : 02/27/24  Physical Exam General: Alert and oriented, pleasant Head:  Normocephalic and atraumatic. Eyes:  No icterus, sclera clear. Conjuctiva pink.  Nose: NG tube in place.  Abdomen:  Bowel sounds present, mild distention, ttp to RLQ.  No HSM or hernias noted. No rebound or guarding. No masses appreciated  Neurologic:  Alert and  oriented x4;  grossly normal neurologically. Skin:  Warm and dry, intact without significant lesions.  Psych:  Alert and cooperative. Normal mood and affect.  Intake/Output from previous day: 05/14 0701 - 05/15 0700 In: 1645.9 [I.V.:1530.9; NG/GT:115] Out: 2100 [Urine:1500; Emesis/NG output:600] Intake/Output this shift: No intake/output data recorded.  Lab Results  Recent Labs    02/27/24 0537 02/27/24 0555 02/28/24 0457  WBC 8.3  --  10.9*  HGB 13.2 12.6* 14.0  HCT 37.5* 37.0* 41.4  PLT 159  --  193   BMET Recent Labs    02/27/24 0537 02/27/24 0555 02/28/24 0457  NA 138 139 139  K 3.9 3.8 4.0  CL 106  104 105  CO2 24  --  21*  GLUCOSE 122* 120* 82  BUN 19 20 16   CREATININE 1.57* 1.50* 1.53*  CALCIUM 9.0  --  8.8*   LFT Recent Labs    02/25/24 1416 02/27/24 0537 02/28/24 0457  PROT 7.0 6.0* 6.3*  ALBUMIN 4.0 3.5 3.4*  AST 20 20 18   ALT 16 15 15   ALKPHOS 59 49 60  BILITOT 0.6 1.0 1.7*  BILIDIR 0.1  --   --   IBILI 0.5  --   --    PT/INR Recent Labs    02/27/24 0708 02/28/24 0457  LABPROT 29.5* 24.2*  INR 2.8* 2.2*   Hepatitis Panel No results for input(s): "HEPBSAG", "HCVAB", "HEPAIGM", "HEPBIGM" in the last 72 hours.  Studies/Results DG Chest Portable 1 View Result Date: 02/27/2024 EXAM: 1 VIEW XRAY OF THE CHEST 02/27/2024 08:02:00 AM COMPARISON: 12/06/2023 chest radiograph. CLINICAL HISTORY: NG placement. Pt states he had multiple episodes of diarrhea yesterday and today; pt states he feels very weak and is unable to walk; Pt c/o nausea but no vomiting; Pt had 2 doses of pepto bismol yesterday. Hx of afib, CAD, pneumonia. Non smoker. FINDINGS: LUNGS AND PLEURA: No consolidation. No pulmonary edema. No pleural effusion. No pneumothorax. HEART AND MEDIASTINUM: No acute abnormality of the cardiac and mediastinal silhouettes. BONES AND SOFT TISSUES: No acute osseous abnormality. LINES AND TUBES: Enteric tube tip just below the esophagogastric junction with side port in the lower thoracic esophagus.  Recommended advancing 8 cm. SOFT TISSUES: Surgical clips in the right upper quadrant of the abdomen. IMPRESSION: 1. Enteric tube tip just below the esophagogastric junction with side port in the lower thoracic esophagus. Recommend advancing 8 cm. 2. No active cardiopulmonary disease. Electronically signed by: Karlyn Overman MD 02/27/2024 08:20 AM EDT RP Workstation: [id number]   CT ABDOMEN PELVIS W CONTRAST Result Date: 02/27/2024 CLINICAL DATA:  Abdominal pain, diarrhea, and weakness. EXAM: CT ABDOMEN AND PELVIS WITH CONTRAST TECHNIQUE: Multidetector CT imaging of the abdomen and pelvis  was performed using the standard protocol following bolus administration of intravenous contrast. RADIATION DOSE REDUCTION: This exam was performed according to the departmental dose-optimization program which includes automated exposure control, adjustment of the mA and/or kV according to patient size and/or use of iterative reconstruction technique. CONTRAST:  100mL OMNIPAQUE  IOHEXOL  300 MG/ML  SOLN COMPARISON:  Numerous prior CTs back to 2009. The 2 most recent are both with contrast and dated 12/06/2023 and 12/22/2022. FINDINGS: Lower chest: There is scarring in the medial right lung base, linear atelectasis in the posterior left lower lobe. Lung bases are clear of infiltrates. Hepatobiliary: Chronic 8 mm too small to characterize hypodensity is unchanged in hepatic segment 6, probable small cyst. Remainder of liver is unremarkable. Gallbladder is absent without bile duct dilatation. Pancreas: No abnormality. Spleen: Stable 2.4 cm splenic cyst.  Otherwise unremarkable spleen. Adrenals/Urinary Tract: No adrenal mass. There is bilateral renal cortical thinning, scattered scarring on the left. There are few small bilateral renal cysts requiring no follow-up imaging. No mass enhancement is seen. There is no urinary stone or obstruction. There is mild chronic bladder thickening and trabeculation, small scattered diverticula. Findings likely due to chronic bladder outlet obstruction. No bladder mass is seen. Stomach/Bowel: Small hiatal hernia. Unremarkable contracted stomach. Beginning at the ligament of Treitz, the small bowel is diffusely dilated up to 4 cm down to a distal ileal surgical anastomotic staple line in the anterior right upper to mid abdomen, past which the the more distal terminal ileum is collapsed. I do not see further areas of transition. Some of the right lower abdominal segments demonstrate mild fold thickening and adjacent edema but do not show pneumatosis. There are stones within a normal  caliber appendix. The colon wall is normal in thickness. There is left-sided diverticulosis with no evidence of diverticulitis. Vascular/Lymphatic: Moderate aortoiliac calcific plaques. Infrarenal aortic ectasia again measures 2.5 cm. No other significant vascular findings.  No adenopathy. Reproductive: Slightly prominent prostate with TURP defect. Other: Bilateral inguinal fat hernias. Trace ascites in the right abdominal mesenteric folds. No free air. Musculoskeletal: Osteopenia with degenerative changes of the spine. Bridging osteophytes left SI joint. Bilateral hip DJD. No acute or other significant osseous findings. IMPRESSION: 1. Diffusely dilated small bowel up to 4 cm down to a distal ileal surgical anastomotic staple line in the anterior right upper to mid abdomen, past which the more distal terminal ileum is collapsed. Findings are consistent with small bowel obstruction. 2. Some of the right lower abdominal segments demonstrate mild fold thickening and adjacent edema but do not show pneumatosis. Early ischemia not excluded. 3. Trace ascites in the right abdominal mesenteric folds. No free air. 4. Diverticulosis without evidence of diverticulitis. 5. Aortic and coronary artery atherosclerosis. 6. Chronic bladder thickening and trabeculation with small diverticula, likely due to chronic bladder outlet obstruction. Query underlying cystitis. 7. Small hiatal hernia.  Bilateral inguinal fat hernias. 8. Osteopenia and degenerative change. Aortic Atherosclerosis (ICD10-I70.0). Electronically Signed   By: Sylvester Evert  Chesser M.D.   On: 02/27/2024 06:42    Assessment  89 y.o. male with a history of Crohn's disease s/p small bowel resection 20 years ago currently on Entyvio , A-fib on Coumadin , CAD, GERD, B12 deficiency, hypothyroidism, and recently diagnosed small bowel carcinoid tumor pending PET scan and treatment options who presented to the ED with complaint of abdominal pain, nausea, and diarrhea with  subsequent halted defecation.  GI consulted for further evaluation.  Crohn's disease with SBO: Recently maintained on Entyvio , was due for his seventh dose on 5/13.  Had appeared to be in clinical remission at his last outpatient follow-up in December.  Day prior to presentation he was feeling well overall until he began having looser stools and nausea without vomiting.  Had reported some darker stools but only after taking Pepto-Bismol.  Has history of SBO in 2020 s/p exploratory laparotomy for lysis of adhesions and underwent small bowel resection.  NG tube output has been minimal overnight and is a light brown/tan color.  CT this admission with diffusely dilated small bowel up to 4 cm down to the distal ileal surgical anastomotic staple line.  NG tube was inserted for conservative management and has been having yellow/tan liquid output.  Yesterday he reportedly was passing flatus and had a small bowel movement.  General surgery following as well.  Unclear etiology for this SBO however it could be sequelae of Crohn's disease, infection, or recurrent adhesions.  He was given Dulcolax suppository yesterday and this morning without bowel movement.  General surgery has plans for small bowel obstruction protocol within the next several days.  They discussed with him that he may need surgery pending clinical course.  Plans to get him back on his Entyvio  dose as soon as possible  -if he is discharged from the hospital by Monday he has plans to get his infusion on Monday.  Plan / Recommendations  Continue NG tube to low remittent wall suction Out of bed as tolerated Keep n.p.o. continue IV fluids Appreciate ongoing general surgery input Continue to hold Coumadin  in the event that surgical intervention is needed. Plans for small bowel follow-through tomorrow May need to consider ileocolonoscopy outpatient pending his clinical course Administer Entyvio  at earliest possibility - Monday if discharged.  Agree  with continuing suppositories at this time    LOS: 1 day    02/28/2024, 9:48 AM   Julian Obey, MSN, FNP-BC, AGACNP-BC [facility]

## 2024-02-28 NOTE — Progress Notes (Signed)
 [facility] Progress Note     Subjective: Patient seen and examined.  He is resting comfortably in bed.  He felt a little bit nauseous last night, but denies any nausea or vomiting this morning.  He had 600 cc of gastric contents from the NG tube overnight.  Patient denies any bowel movements, but has been passing some gas today.  Objective: Vital signs in last 24 hours: Temp:  [98.2 F (36.8 C)-99 F (37.2 C)] 99 F (37.2 C) (05/15 0426) Pulse Rate:  [72-99] 91 (05/15 1100) Resp:  [17-21] 19 (05/15 0426) BP: (132-153)/(71-103) 153/99 (05/15 1100) SpO2:  [95 %-97 %] 95 % (05/15 0426) Last BM Date : 02/27/24  Intake/Output from previous day: 05/14 0701 - 05/15 0700 In: 1645.9 [I.V.:1530.9; NG/GT:115] Out: 2100 [Urine:1500; Emesis/NG output:600] Intake/Output this shift: Total I/O In: 30 [NG/GT:30] Out: -   General appearance: alert, cooperative, and no distress GI: Abdomen soft, mild distention, no percussion tenderness, nontender to palpation; no rigidity, guarding, rebound tenderness  Lab Results:  Recent Labs    02/27/24 0537 02/27/24 0555 02/28/24 0457  WBC 8.3  --  10.9*  HGB 13.2 12.6* 14.0  HCT 37.5* 37.0* 41.4  PLT 159  --  193   BMET Recent Labs    02/27/24 0537 02/27/24 0555 02/28/24 0457  NA 138 139 139  K 3.9 3.8 4.0  CL 106 104 105  CO2 24  --  21*  GLUCOSE 122* 120* 82  BUN 19 20 16   CREATININE 1.57* 1.50* 1.53*  CALCIUM 9.0  --  8.8*   PT/INR Recent Labs    02/27/24 0708 02/28/24 0457  LABPROT 29.5* 24.2*  INR 2.8* 2.2*    Studies/Results: DG Chest Portable 1 View Result Date: 02/27/2024 EXAM: 1 VIEW XRAY OF THE CHEST 02/27/2024 08:02:00 AM COMPARISON: 12/06/2023 chest radiograph. CLINICAL HISTORY: NG placement. Pt states he had multiple episodes of diarrhea yesterday and today; pt states he feels very weak and is unable to walk; Pt c/o nausea but no vomiting; Pt had 2 doses of pepto bismol yesterday. Hx of afib, CAD,  pneumonia. Non smoker. FINDINGS: LUNGS AND PLEURA: No consolidation. No pulmonary edema. No pleural effusion. No pneumothorax. HEART AND MEDIASTINUM: No acute abnormality of the cardiac and mediastinal silhouettes. BONES AND SOFT TISSUES: No acute osseous abnormality. LINES AND TUBES: Enteric tube tip just below the esophagogastric junction with side port in the lower thoracic esophagus. Recommended advancing 8 cm. SOFT TISSUES: Surgical clips in the right upper quadrant of the abdomen. IMPRESSION: 1. Enteric tube tip just below the esophagogastric junction with side port in the lower thoracic esophagus. Recommend advancing 8 cm. 2. No active cardiopulmonary disease. Electronically signed by: Karlyn Overman MD 02/27/2024 08:20 AM EDT RP Workstation: [id number]   CT ABDOMEN PELVIS W CONTRAST Result Date: 02/27/2024 CLINICAL DATA:  Abdominal pain, diarrhea, and weakness. EXAM: CT ABDOMEN AND PELVIS WITH CONTRAST TECHNIQUE: Multidetector CT imaging of the abdomen and pelvis was performed using the standard protocol following bolus administration of intravenous contrast. RADIATION DOSE REDUCTION: This exam was performed according to the departmental dose-optimization program which includes automated exposure control, adjustment of the mA and/or kV according to patient size and/or use of iterative reconstruction technique. CONTRAST:  OMNIPAQUE  IOHEXOL  300 MG/ML  SOLN COMPARISON:  Numerous prior CTs back to 2009. The 2 most recent are both with contrast and dated 12/06/2023 and 12/22/2022. FINDINGS: Lower chest: There is scarring in the medial right lung base, linear atelectasis in the posterior  left lower lobe. Lung bases are clear of infiltrates. Hepatobiliary: Chronic 8 mm too small to characterize hypodensity is unchanged in hepatic segment 6, probable small cyst. Remainder of liver is unremarkable. Gallbladder is absent without bile duct dilatation. Pancreas: No abnormality. Spleen: Stable 2.4 cm splenic cyst.   Otherwise unremarkable spleen. Adrenals/Urinary Tract: No adrenal mass. There is bilateral renal cortical thinning, scattered scarring on the left. There are few small bilateral renal cysts requiring no follow-up imaging. No mass enhancement is seen. There is no urinary stone or obstruction. There is mild chronic bladder thickening and trabeculation, small scattered diverticula. Findings likely due to chronic bladder outlet obstruction. No bladder mass is seen. Stomach/Bowel: Small hiatal hernia. Unremarkable contracted stomach. Beginning at the ligament of Treitz, the small bowel is diffusely dilated up to 4 cm down to a distal ileal surgical anastomotic staple line in the anterior right upper to mid abdomen, past which the the more distal terminal ileum is collapsed. I do not see further areas of transition. Some of the right lower abdominal segments demonstrate mild fold thickening and adjacent edema but do not show pneumatosis. There are stones within a normal caliber appendix. The colon wall is normal in thickness. There is left-sided diverticulosis with no evidence of diverticulitis. Vascular/Lymphatic: Moderate aortoiliac calcific plaques. Infrarenal aortic ectasia again measures 2.5 cm. No other significant vascular findings.  No adenopathy. Reproductive: Slightly prominent prostate with TURP defect. Other: Bilateral inguinal fat hernias. Trace ascites in the right abdominal mesenteric folds. No free air. Musculoskeletal: Osteopenia with degenerative changes of the spine. Bridging osteophytes left SI joint. Bilateral hip DJD. No acute or other significant osseous findings. IMPRESSION: 1. Diffusely dilated small bowel up to 4 cm down to a distal ileal surgical anastomotic staple line in the anterior right upper to mid abdomen, past which the more distal terminal ileum is collapsed. Findings are consistent with small bowel obstruction. 2. Some of the right lower abdominal segments demonstrate mild fold  thickening and adjacent edema but do not show pneumatosis. Early ischemia not excluded. 3. Trace ascites in the right abdominal mesenteric folds. No free air. 4. Diverticulosis without evidence of diverticulitis. 5. Aortic and coronary artery atherosclerosis. 6. Chronic bladder thickening and trabeculation with small diverticula, likely due to chronic bladder outlet obstruction. Query underlying cystitis. 7. Small hiatal hernia.  Bilateral inguinal fat hernias. 8. Osteopenia and degenerative change. Aortic Atherosclerosis (ICD10-I70.0). Electronically Signed   By: Denman Fischer M.D.   On: 02/27/2024 06:42    Anti-infectives: Anti-infectives (From admission, onward)    None       Assessment/Plan:  Patient is a 89 year old male who was admitted with a small bowel obstruction.  - Discussed with the patient that we are going to go slow and give his bowels time to respond and see if the NG tube and other conservative measures alleviate his current obstructive symptoms - Patient likely has a partial obstruction given that he is still passing flatus - Continue NPO, NG to LIS - IV fluids per primary team - Monitor for return of bowel function - Continue Dulcolax suppositories - Continue to hold warfarin, INR slowly decreasing, 2.2 from 2.8 yesterday - Will plan for small bowel obstruction protocol likely tomorrow versus Saturday pending patient's clinical progression and INR - No acute surgical intervention at this time.  Again discussed with the patient and his wife that he may need surgery pending how he progresses - Appreciate hospitalist and GI recommendations   LOS: 1 day  Darrall Strey A Blanchie Zeleznik 02/28/2024  Note: Portions of this report may have been transcribed using voice recognition software. Every effort has been made to ensure accuracy; however, inadvertent computerized transcription errors may still be present.

## 2024-02-28 NOTE — Plan of Care (Signed)

## 2024-02-28 NOTE — Progress Notes (Addendum)
 PROGRESS NOTE  [name] ZOX:[id number] DOB: [birth year] DOA: 02/27/2024 PCP: Orest Bio, MD  Brief History:  89 year old male with a history of permanent atrial fibrillation on warfarin, nonobstructive CAD, chronic disease with chronic stricture, carcinoid tumor, GERD, hypothyroidism, and B12 deficiency presenting with nausea, abdominal pain, and diarrhea.  The patient had been in his usual state of health before developing nausea and diarrhea on 02/26/2024.  He had 5 loose bowel movements on 02/25/2024 without any blood.  After he took Pepto-Bismol x 2, he noted black stools.  He had 1 bowel movement that was loose prior to coming to the emergency room in the early morning 02/27/2024.  He is passing flatus presently.  He denies any new medications.  He denies any fevers but has some subjective chills.  He denies any headache, neck pain, chest pain, shortness of breath, coughing, hemoptysis.  There is no hematochezia.  There is no dysuria or hematuria. Notably, the patient had a recent EGD performed on 02/15/2024 which showed a 2 cm hiatal hernia, and a submucosal nodule in the duodenum NET.  Since then, he has restarted his warfarin.  His last warfarin dose was on the evening of 02/26/2024.  Because of worsening abdominal pain and distention and loose stool, he presented for further evaluation and treatment. In the ED, the patient was afebrile hemodynamically stable with oxygen  saturation 97% room air.  WBC 8.3, hemoglobin 13.2, platelets 159.  Sodium 139, potassium 3.8, bicarbonate 22, serum creatinine 1.50.  General surgery was consulted.  They recommended NG tube and holding warfarin for now.    Assessment/Plan: Partial small bowel obstruction - Patient is passing flatus - He had 1 loose bowel movement prior to coming to the emergency department in the early a.m. 02/27/2024 -02/27/2024 CT AP--diffusely dilated small bowel up to 4 cm down to the distal ileal surgical anastomosis;  diverticulosis; chronic bladder wall thickening; mild focal thickening and adjacent edema in the right upper quadrant - General Surgery consult appreciated - 02/28/24--had one BM in afternoon - Optimize electrolytes - Change medications to IV if possible - appreciate GI consult - continue IVF   Carcinoid tumor - Patient is scheduled for PET scan in near future - Follow-up with Dr. Cheree Cords   Permanent atrial fibrillation - Currently rate controlled - continue IV metoprolol until able to tolerate p.o. - Holding warfarin -INR 2.8>>2.3  Essential HTN -increase lopressor to 5 mg IV q 6 hrs   CKD 3b -baseline creatinine 1.2-1.5   Crohn's disease - The patient's next dose of Entyvio  is due on 03/03/2024 - Follow-up Dr. Sammi Crick   Nonobstructive CAD - No chest pain presently   Hypothyroidism - Plan to start IV Synthroid  if remains n.p.o. for prolonged period of time   GERD - Continue IV pantoprazole        Family Communication:   spouse at bedside 5/15  Consultants:  GI, gen surgery Code Status:  FULL   DVT Prophylaxis:  warfarin   Procedures: As Listed in Progress Note Above  Antibiotics: None       Subjective: Pt still has some abd pain, but had BM this afternoon.  Passing flatus.  Denies cp, sob, n/v/  Objective: Vitals:   02/27/24 2323 02/28/24 0426 02/28/24 1100 02/28/24 1245  BP: (!) 150/103 (!) 146/98 (!) 153/99 (!) 161/93  Pulse: 97 72 91 83  Resp: 19 19    Temp: 99 F (37.2 C) 99 F (37.2 C)  98.2 F (36.8 C)  TempSrc: Oral Oral  Oral  SpO2: 95% 95%  97%  Weight:      Height:        Intake/Output Summary (Last 24 hours) at 02/28/2024 1804 Last data filed at 02/28/2024 0730 Gross per 24 hour  Intake 1660.91 ml  Output 1200 ml  Net 460.91 ml   Weight change:  Exam:  General:  Pt is alert, follows commands appropriately, not in acute distress HEENT: No icterus, No thrush, No neck mass, [place]/AT Cardiovascular: RRR, S1/S2, no rubs,  no gallops Respiratory: CTA bilaterally, no wheezing, no crackles, no rhonchi Abdomen: Soft/+BS, mild diffuse tender, non distended, no guarding Extremities: No edema, No lymphangitis, No petechiae, No rashes, no synovitis   Data Reviewed: I have personally reviewed following labs and imaging studies Basic Metabolic Panel: Recent Labs  Lab 02/27/24 0537 02/27/24 0555 02/28/24 0457  NA 138 139 139  K 3.9 3.8 4.0  CL 106 104 105  CO2 24  --  21*  GLUCOSE 122* 120* 82  BUN 19 20 16   CREATININE 1.57* 1.50* 1.53*  CALCIUM 9.0  --  8.8*  MG  --   --  1.7  PHOS  --   --  3.3   Liver Function Tests: Recent Labs  Lab 02/25/24 1416 02/27/24 0537 02/28/24 0457  AST 20 20 18   ALT 16 15 15   ALKPHOS 59 49 60  BILITOT 0.6 1.0 1.7*  PROT 7.0 6.0* 6.3*  ALBUMIN 4.0 3.5 3.4*   Recent Labs  Lab 02/27/24 0537  LIPASE 38   No results for input(s): "AMMONIA" in the last 168 hours. Coagulation Profile: Recent Labs  Lab 02/27/24 0708 02/28/24 0457  INR 2.8* 2.2*   CBC: Recent Labs  Lab 02/27/24 0537 02/27/24 0555 02/28/24 0457  WBC 8.3  --  10.9*  NEUTROABS 6.0  --   --   HGB 13.2 12.6* 14.0  HCT 37.5* 37.0* 41.4  MCV 89.3  --  91.0  PLT 159  --  193   Cardiac Enzymes: No results for input(s): "CKTOTAL", "CKMB", "CKMBINDEX", "TROPONINI" in the last 168 hours. BNP: Invalid input(s): "POCBNP" CBG: No results for input(s): "GLUCAP" in the last 168 hours. HbA1C: No results for input(s): "HGBA1C" in the last 72 hours. Urine analysis:    Component Value Date/Time   COLORURINE YELLOW 12/06/2023 1236   APPEARANCEUR CLEAR 12/06/2023 1236   APPEARANCEUR Clear 03/16/2021 1450   LABSPEC 1.015 12/06/2023 1236   PHURINE 6.0 12/06/2023 1236   GLUCOSEU NEGATIVE 12/06/2023 1236   HGBUR SMALL (A) 12/06/2023 1236   BILIRUBINUR NEGATIVE 12/06/2023 1236   BILIRUBINUR Negative 03/16/2021 1450   KETONESUR NEGATIVE 12/06/2023 1236   PROTEINUR NEGATIVE 12/06/2023 1236    UROBILINOGEN negative (A) 03/23/2020 1506   NITRITE NEGATIVE 12/06/2023 1236   LEUKOCYTESUR NEGATIVE 12/06/2023 1236   Sepsis Labs: @LABRCNTIP (procalcitonin:4,lacticidven:4) ) Recent Results (from the past 240 hours)  C Difficile Quick Screen w PCR reflex     Status: None   Collection Time: 02/28/24 12:45 PM   Specimen: STOOL  Result Value Ref Range Status   C Diff antigen NEGATIVE NEGATIVE Final   C Diff toxin NEGATIVE NEGATIVE Final   C Diff interpretation No C. difficile detected.  Final    Comment: Performed at [facility], [address] [place], [place] [address]     Scheduled Meds:  bisacodyl   10 mg Rectal Daily   metoprolol tartrate  5 mg Intravenous Q6H   pantoprazole  (PROTONIX ) IV  40 mg Intravenous Q12H   Continuous Infusions:  lactated ringers  75 mL/hr at 02/28/24 1214    Procedures/Studies: DG Chest Portable 1 View Result Date: 02/27/2024 EXAM: 1 VIEW XRAY OF THE CHEST 02/27/2024 08:02:00 AM COMPARISON: 12/06/2023 chest radiograph. CLINICAL HISTORY: NG placement. Pt states he had multiple episodes of diarrhea yesterday and today; pt states he feels very weak and is unable to walk; Pt c/o nausea but no vomiting; Pt had 2 doses of pepto bismol yesterday. Hx of afib, CAD, pneumonia. Non smoker. FINDINGS: LUNGS AND PLEURA: No consolidation. No pulmonary edema. No pleural effusion. No pneumothorax. HEART AND MEDIASTINUM: No acute abnormality of the cardiac and mediastinal silhouettes. BONES AND SOFT TISSUES: No acute osseous abnormality. LINES AND TUBES: Enteric tube tip just below the esophagogastric junction with side port in the lower thoracic esophagus. Recommended advancing 8 cm. SOFT TISSUES: Surgical clips in the right upper quadrant of the abdomen. IMPRESSION: 1. Enteric tube tip just below the esophagogastric junction with side port in the lower thoracic esophagus. Recommend advancing 8 cm. 2. No active cardiopulmonary disease. Electronically signed by: Karlyn Overman MD  02/27/2024 08:20 AM EDT RP Workstation: [id number]   CT ABDOMEN PELVIS W CONTRAST Result Date: 02/27/2024 CLINICAL DATA:  Abdominal pain, diarrhea, and weakness. EXAM: CT ABDOMEN AND PELVIS WITH CONTRAST TECHNIQUE: Multidetector CT imaging of the abdomen and pelvis was performed using the standard protocol following bolus administration of intravenous contrast. RADIATION DOSE REDUCTION: This exam was performed according to the departmental dose-optimization program which includes automated exposure control, adjustment of the mA and/or kV according to patient size and/or use of iterative reconstruction technique. CONTRAST:  100mL OMNIPAQUE  IOHEXOL  300 MG/ML  SOLN COMPARISON:  Numerous prior CTs back to 2009. The 2 most recent are both with contrast and dated 12/06/2023 and 12/22/2022. FINDINGS: Lower chest: There is scarring in the medial right lung base, linear atelectasis in the posterior left lower lobe. Lung bases are clear of infiltrates. Hepatobiliary: Chronic 8 mm too small to characterize hypodensity is unchanged in hepatic segment 6, probable small cyst. Remainder of liver is unremarkable. Gallbladder is absent without bile duct dilatation. Pancreas: No abnormality. Spleen: Stable 2.4 cm splenic cyst.  Otherwise unremarkable spleen. Adrenals/Urinary Tract: No adrenal mass. There is bilateral renal cortical thinning, scattered scarring on the left. There are few small bilateral renal cysts requiring no follow-up imaging. No mass enhancement is seen. There is no urinary stone or obstruction. There is mild chronic bladder thickening and trabeculation, small scattered diverticula. Findings likely due to chronic bladder outlet obstruction. No bladder mass is seen. Stomach/Bowel: Small hiatal hernia. Unremarkable contracted stomach. Beginning at the ligament of Treitz, the small bowel is diffusely dilated up to 4 cm down to a distal ileal surgical anastomotic staple line in the anterior right upper to mid  abdomen, past which the the more distal terminal ileum is collapsed. I do not see further areas of transition. Some of the right lower abdominal segments demonstrate mild fold thickening and adjacent edema but do not show pneumatosis. There are stones within a normal caliber appendix. The colon wall is normal in thickness. There is left-sided diverticulosis with no evidence of diverticulitis. Vascular/Lymphatic: Moderate aortoiliac calcific plaques. Infrarenal aortic ectasia again measures 2.5 cm. No other significant vascular findings.  No adenopathy. Reproductive: Slightly prominent prostate with TURP defect. Other: Bilateral inguinal fat hernias. Trace ascites in the right abdominal mesenteric folds. No free air. Musculoskeletal: Osteopenia with degenerative changes of the spine. Bridging osteophytes left SI joint. Bilateral  hip DJD. No acute or other significant osseous findings. IMPRESSION: 1. Diffusely dilated small bowel up to 4 cm down to a distal ileal surgical anastomotic staple line in the anterior right upper to mid abdomen, past which the more distal terminal ileum is collapsed. Findings are consistent with small bowel obstruction. 2. Some of the right lower abdominal segments demonstrate mild fold thickening and adjacent edema but do not show pneumatosis. Early ischemia not excluded. 3. Trace ascites in the right abdominal mesenteric folds. No free air. 4. Diverticulosis without evidence of diverticulitis. 5. Aortic and coronary artery atherosclerosis. 6. Chronic bladder thickening and trabeculation with small diverticula, likely due to chronic bladder outlet obstruction. Query underlying cystitis. 7. Small hiatal hernia.  Bilateral inguinal fat hernias. 8. Osteopenia and degenerative change. Aortic Atherosclerosis (ICD10-I70.0). Electronically Signed   By: Denman Fischer M.D.   On: 02/27/2024 06:42    Demaris Fillers, DO  [facility]  If 7PM-7AM, please contact  night-coverage [email or web address] Password [email or web address] 02/28/2024, 6:04 PM   LOS: 1 day

## 2024-02-29 ENCOUNTER — Inpatient Hospital Stay (HOSPITAL_COMMUNITY)

## 2024-02-29 DIAGNOSIS — D696 Thrombocytopenia, unspecified: Secondary | ICD-10-CM | POA: Diagnosis not present

## 2024-02-29 DIAGNOSIS — Z7901 Long term (current) use of anticoagulants: Secondary | ICD-10-CM | POA: Diagnosis not present

## 2024-02-29 DIAGNOSIS — K56609 Unspecified intestinal obstruction, unspecified as to partial versus complete obstruction: Secondary | ICD-10-CM | POA: Diagnosis not present

## 2024-02-29 DIAGNOSIS — I4821 Permanent atrial fibrillation: Secondary | ICD-10-CM | POA: Diagnosis not present

## 2024-02-29 LAB — PROTIME-INR
INR: 2.6 — ABNORMAL HIGH (ref 0.8–1.2)
Prothrombin Time: 27.8 s — ABNORMAL HIGH (ref 11.4–15.2)

## 2024-02-29 LAB — GASTROINTESTINAL PANEL BY PCR, STOOL (REPLACES STOOL CULTURE)

## 2024-02-29 LAB — BASIC METABOLIC PANEL WITH GFR
Anion gap: 12 (ref 5–15)
BUN: 19 mg/dL (ref 8–23)
CO2: 21 mmol/L — ABNORMAL LOW (ref 22–32)
Calcium: 8.5 mg/dL — ABNORMAL LOW (ref 8.9–10.3)
Chloride: 103 mmol/L (ref 98–111)
Creatinine, Ser: 1.24 mg/dL (ref 0.61–1.24)
GFR, Estimated: 55 mL/min — ABNORMAL LOW (ref >60)
Glucose, Bld: 85 mg/dL (ref 70–99)
Potassium: 3.8 mmol/L (ref 3.5–5.1)
Sodium: 136 mmol/L (ref 135–145)

## 2024-02-29 LAB — CBC
HCT: 41 % (ref 39.0–52.0)
Hemoglobin: 14.2 g/dL (ref 13.0–17.0)
MCH: 30.9 pg (ref 26.0–34.0)
MCHC: 34.6 g/dL (ref 30.0–36.0)
MCV: 89.1 fL (ref 80.0–100.0)
Platelets: 181 10*3/uL (ref 150–400)
RBC: 4.6 MIL/uL (ref 4.22–5.81)
RDW: 13.5 % (ref 11.5–15.5)
WBC: 10.8 10*3/uL — ABNORMAL HIGH (ref 4.0–10.5)
nRBC: 0 % (ref 0.0–0.2)

## 2024-02-29 LAB — PHOSPHORUS: Phosphorus: 2.4 mg/dL — ABNORMAL LOW (ref 2.5–4.6)

## 2024-02-29 LAB — MAGNESIUM: Magnesium: 1.7 mg/dL (ref 1.7–2.4)

## 2024-02-29 MED ORDER — LACTATED RINGERS IV SOLN: INTRAVENOUS | Status: AC

## 2024-02-29 MED ORDER — MAGNESIUM SULFATE 2 GM/50ML IV SOLN
2.0000 g | Freq: Once | INTRAVENOUS | Status: AC
Start: 2024-02-29 — End: 2024-02-29
  Administered 2024-02-29: 2 g via INTRAVENOUS
  Filled 2024-02-29: qty 50

## 2024-02-29 MED ORDER — DIATRIZOATE MEGLUMINE & SODIUM 66-10 % PO SOLN
90.0000 mL | Freq: Once | ORAL | Status: AC
  Administered 2024-02-29: 90 mL via NASOGASTRIC
  Filled 2024-02-29: qty 90

## 2024-02-29 MED ORDER — PHENOL 1.4 % MT LIQD
1.0000 | OROMUCOSAL | Status: DC | PRN
  Administered 2024-02-29: 1 via OROMUCOSAL
  Filled 2024-02-29: qty 177

## 2024-02-29 NOTE — Plan of Care (Signed)

## 2024-02-29 NOTE — Progress Notes (Signed)
 Gastroenterology Progress Note   Referring Provider: No ref. provider found Primary Care Physician:  Orest Bio, MD Primary Gastroenterologist: Urban Garden, MD   Patient ID: [name]; [id number]; 1931-05-12    Subjective   Patient reports sore throat this afternoon secondary to NG tube.  Was having a cough earlier today however improved after throat spray/lozenge given.  Denies any abdominal pain.  Has passed lots of flatus yesterday afternoon and overnight into this morning as well as had a very large bowel movement yesterday, a mushy bowel movement this morning, and some more liquidy brown stool which I observed in the bedside commode this afternoon.  Objective   Vital signs in last 24 hours Temp:  [98.7 F (37.1 C)-99.3 F (37.4 C)] 98.7 F (37.1 C) (05/16 0408) Pulse Rate:  [91-102] 102 (05/16 0408) Resp:  [15-18] 15 (05/16 0408) BP: (168-174)/(86-95) 168/95 (05/16 0408) SpO2:  [95 %-96 %] 96 % (05/16 0408) Last BM Date : 02/27/24  Physical Exam General:   Alert and oriented, pleasant Head:  Normocephalic and atraumatic. Eyes:  No icterus, sclera clear. Conjuctiva pink.  Mouth:  Without lesions, mucosa pink and moist.  Nose: NGT in place - clamped Abdomen:  Bowel sounds present, soft, non-tender, non-distended. No HSM or hernias noted. No rebound or guarding. No masses appreciated  Neurologic:  Alert and  oriented x4;  grossly normal neurologically. Psych:  Alert and cooperative. Normal mood and affect.  Intake/Output from previous day: 05/15 0701 - 05/16 0700 In: 1254.3 [I.V.:1224.3; NG/GT:30] Out: 1250 [Urine:750; Emesis/NG output:500] Intake/Output this shift: Total I/O In: 561.3 [I.V.:561.3] Out: -   Lab Results  Recent Labs    02/27/24 0537 02/27/24 0555 02/28/24 0457 02/29/24 0449  WBC 8.3  --  10.9* 10.8*  HGB 13.2 12.6* 14.0 14.2  HCT 37.5* 37.0* 41.4 41.0  PLT 159  --  193 181   BMET Recent Labs    02/27/24 0537  02/27/24 0555 02/28/24 0457 02/29/24 0449  NA 138 139 139 136  K 3.9 3.8 4.0 3.8  CL 106 104 105 103  CO2 24  --  21* 21*  GLUCOSE 122* 120* 82 85  BUN 19 20 16 19   CREATININE 1.57* 1.50* 1.53* 1.24  CALCIUM 9.0  --  8.8* 8.5*   LFT Recent Labs    02/27/24 0537 02/28/24 0457  PROT 6.0* 6.3*  ALBUMIN 3.5 3.4*  AST 20 18  ALT 15 15  ALKPHOS 49 60  BILITOT 1.0 1.7*   PT/INR Recent Labs    02/28/24 0457 02/29/24 0449  LABPROT 24.2* 27.8*  INR 2.2* 2.6*   Hepatitis Panel No results for input(s): "HEPBSAG", "HCVAB", "HEPAIGM", "HEPBIGM" in the last 72 hours. C-Diff PCR negative  Studies/Results DG Chest Portable 1 View Result Date: 02/27/2024 EXAM: 1 VIEW XRAY OF THE CHEST 02/27/2024 08:02:00 AM COMPARISON: 12/06/2023 chest radiograph. CLINICAL HISTORY: NG placement. Pt states he had multiple episodes of diarrhea yesterday and today; pt states he feels very weak and is unable to walk; Pt c/o nausea but no vomiting; Pt had 2 doses of pepto bismol yesterday. Hx of afib, CAD, pneumonia. Non smoker. FINDINGS: LUNGS AND PLEURA: No consolidation. No pulmonary edema. No pleural effusion. No pneumothorax. HEART AND MEDIASTINUM: No acute abnormality of the cardiac and mediastinal silhouettes. BONES AND SOFT TISSUES: No acute osseous abnormality. LINES AND TUBES: Enteric tube tip just below the esophagogastric junction with side port in the lower thoracic esophagus. Recommended advancing 8 cm. SOFT TISSUES: Surgical  clips in the right upper quadrant of the abdomen. IMPRESSION: 1. Enteric tube tip just below the esophagogastric junction with side port in the lower thoracic esophagus. Recommend advancing 8 cm. 2. No active cardiopulmonary disease. Electronically signed by: Karlyn Overman MD 02/27/2024 08:20 AM EDT RP Workstation: [id number]   CT ABDOMEN PELVIS W CONTRAST Result Date: 02/27/2024 CLINICAL DATA:  Abdominal pain, diarrhea, and weakness. EXAM: CT ABDOMEN AND PELVIS WITH CONTRAST  TECHNIQUE: Multidetector CT imaging of the abdomen and pelvis was performed using the standard protocol following bolus administration of intravenous contrast. RADIATION DOSE REDUCTION: This exam was performed according to the departmental dose-optimization program which includes automated exposure control, adjustment of the mA and/or kV according to patient size and/or use of iterative reconstruction technique. CONTRAST:  100mL OMNIPAQUE  IOHEXOL  300 MG/ML  SOLN COMPARISON:  Numerous prior CTs back to 2009. The 2 most recent are both with contrast and dated 12/06/2023 and 12/22/2022. FINDINGS: Lower chest: There is scarring in the medial right lung base, linear atelectasis in the posterior left lower lobe. Lung bases are clear of infiltrates. Hepatobiliary: Chronic 8 mm too small to characterize hypodensity is unchanged in hepatic segment 6, probable small cyst. Remainder of liver is unremarkable. Gallbladder is absent without bile duct dilatation. Pancreas: No abnormality. Spleen: Stable 2.4 cm splenic cyst.  Otherwise unremarkable spleen. Adrenals/Urinary Tract: No adrenal mass. There is bilateral renal cortical thinning, scattered scarring on the left. There are few small bilateral renal cysts requiring no follow-up imaging. No mass enhancement is seen. There is no urinary stone or obstruction. There is mild chronic bladder thickening and trabeculation, small scattered diverticula. Findings likely due to chronic bladder outlet obstruction. No bladder mass is seen. Stomach/Bowel: Small hiatal hernia. Unremarkable contracted stomach. Beginning at the ligament of Treitz, the small bowel is diffusely dilated up to 4 cm down to a distal ileal surgical anastomotic staple line in the anterior right upper to mid abdomen, past which the the more distal terminal ileum is collapsed. I do not see further areas of transition. Some of the right lower abdominal segments demonstrate mild fold thickening and adjacent edema but do  not show pneumatosis. There are stones within a normal caliber appendix. The colon wall is normal in thickness. There is left-sided diverticulosis with no evidence of diverticulitis. Vascular/Lymphatic: Moderate aortoiliac calcific plaques. Infrarenal aortic ectasia again measures 2.5 cm. No other significant vascular findings.  No adenopathy. Reproductive: Slightly prominent prostate with TURP defect. Other: Bilateral inguinal fat hernias. Trace ascites in the right abdominal mesenteric folds. No free air. Musculoskeletal: Osteopenia with degenerative changes of the spine. Bridging osteophytes left SI joint. Bilateral hip DJD. No acute or other significant osseous findings. IMPRESSION: 1. Diffusely dilated small bowel up to 4 cm down to a distal ileal surgical anastomotic staple line in the anterior right upper to mid abdomen, past which the more distal terminal ileum is collapsed. Findings are consistent with small bowel obstruction. 2. Some of the right lower abdominal segments demonstrate mild fold thickening and adjacent edema but do not show pneumatosis. Early ischemia not excluded. 3. Trace ascites in the right abdominal mesenteric folds. No free air. 4. Diverticulosis without evidence of diverticulitis. 5. Aortic and coronary artery atherosclerosis. 6. Chronic bladder thickening and trabeculation with small diverticula, likely due to chronic bladder outlet obstruction. Query underlying cystitis. 7. Small hiatal hernia.  Bilateral inguinal fat hernias. 8. Osteopenia and degenerative change. Aortic Atherosclerosis (ICD10-I70.0). Electronically Signed   By: Denman Fischer M.D.   On: 02/27/2024  06:87    Assessment  89 y.o. male with a history of Crohn's disease s/p small bowel resection 20 years ago currently on Entyvio , A-fib on Coumadin , CAD, GERD, B12 deficiency, hypothyroidism, and recently diagnosed small bowel carcinoid tumor pending PET scan and treatment options who presented to the ED with complaint  of abdominal pain, nausea, and diarrhea with subsequent halted defecation.  GI consulted for further evaluation.   Chron's disease with SBO: Maintained on Entyvio  since August/September of last year.  Recently due for his seventh dose on 5/13 however given CT reads will warming break and illness this was not performed.  Appear to be in clinical remission in regards to his Crohn's at his last outpatient follow-up in December.  Just prior to presentation he began having looser stools as well as nausea without vomiting, darker stools only noticed after taking Pepto-Bismol.  Has history of SBO s/p exploratory laparotomy for lysis of adhesions in which he underwent small bowel resection.  CT admission with diffusely dilated small bowel up to 4cm down to he distal ileal surgical anastomotic staple line.  NG tube inserted for conservative management with yellow/tan liquid output over the last couple of days, over the last 24 hours he has had about 500 cc output however only 150 overnight.  General surgery following with him and etiology is unclear at this time however could be sequelae of Crohn's disease versus infection versus recurrent adhesions.  He has been receiving Dulcolax suppositories and he has been passing flatus yesterday and today and had a large bowel movement yesterday afternoon.  General surgery following along with him as well.  Small bowel follow through started this afternoon with initial KUB read stating small bowel dilation at 3cm versus prior 4cm.  Given he has had a bowel movement and is passing flatus, will likely continue conservative management for now although and follow up final read for small bowel protocol imaging. Multiple discussions have been held with him in regards to potential possibility for surgical intervention if he does not continue to progress.  Tentative plans for Entyvio  infusion on Monday if he is discharged over the weekend, this may need to be postponed further if not  discharged as unlikely to be able to receive Entyvio  infusion inpatient.  Plan / Recommendations  Appreciate ongoing general surgery recommendations Continue dulcolax suppositories If able to tolerate NG tube clamped, may consider starting further bowel regimen such as MiraLAX  Continue iv hydration until diet initiated Encourage OOB and ambulation as able.  Hopeful to proceed with Entivyo infusion as soon as possible -tentative plan for Monday if discharged    LOS: 2 days    02/29/2024, 1:50 PM   Julian Obey, MSN, FNP-BC, AGACNP-BC [facility]

## 2024-02-29 NOTE — Progress Notes (Signed)
 Mobility Specialist Progress Note:    02/29/24 1220  Mobility  Activity Transferred from bed to chair;Ambulated with assistance in room  Level of Assistance Standby assist, set-up cues, supervision of patient - no hands on  Assistive Device Front wheel walker  Distance Ambulated (ft) 3 ft  Range of Motion/Exercises Active;All extremities  Activity Response Tolerated well  Mobility Referral Yes  Mobility visit 1 Mobility  Mobility Specialist Start Time (ACUTE ONLY) 1220  Mobility Specialist Stop Time (ACUTE ONLY) 1240  Mobility Specialist Time Calculation (min) (ACUTE ONLY) 20 min   Pt received in bed, wife in room. Agreeable to mobility, required SBA to stand and ambulate with RW. Tolerated well,asx throughout. Call bell in reach, all needs met.  Luan Maberry Mobility Specialist Please contact via Special educational needs teacher or  Rehab office at [phone]

## 2024-02-29 NOTE — Progress Notes (Signed)
 [facility] Progress Note     Subjective: Patient seen and examined.  He is resting comfortably in bed.  He denies nausea and vomiting, and confirms a small bowel movement yesterday.  He has also been passing a large amount of flatus today.  He denies abdominal pain.  NG tube with 500 cc of gastric contents out in the last 24 hours.  Objective: Vital signs in last 24 hours: Temp:  [98.2 F (36.8 C)-99.3 F (37.4 C)] 98.7 F (37.1 C) (05/16 0408) Pulse Rate:  [83-102] 102 (05/16 0408) Resp:  [15-18] 15 (05/16 0408) BP: (161-174)/(86-95) 168/95 (05/16 0408) SpO2:  [95 %-97 %] 96 % (05/16 0408) Last BM Date : 02/27/24  Intake/Output from previous day: 05/15 0701 - 05/16 0700 In: 1254.3 [I.V.:1224.3; NG/GT:30] Out: 1250 [Urine:750; Emesis/NG output:500] Intake/Output this shift: No intake/output data recorded.  General appearance: alert, cooperative, and no distress Nose: NG tube in place with minimal gastric contents in canister GI: Abdomen soft, nondistended, no percussion tenderness, nontender to palpation; no rigidity, guarding, rebound tenderness  Lab Results:  Recent Labs    02/28/24 0457 02/29/24 0449  WBC 10.9* 10.8*  HGB 14.0 14.2  HCT 41.4 41.0  PLT 193 181   BMET Recent Labs    02/28/24 0457 02/29/24 0449  NA 139 136  K 4.0 3.8  CL 105 103  CO2 21* 21*  GLUCOSE 82 85  BUN 16 19  CREATININE 1.53* 1.24  CALCIUM 8.8* 8.5*   PT/INR Recent Labs    02/28/24 0457 02/29/24 0449  LABPROT 24.2* 27.8*  INR 2.2* 2.6*    Studies/Results: No results found.  Anti-infectives: Anti-infectives (From admission, onward)    None       Assessment/Plan:  Patient is a 89 year old male who was admitted with a small bowel obstruction.  - Patient with return of bowel function with bowel movement yesterday and passing flatus. - Will obtain a small bowel obstruction protocol today to fully evaluate for resolution of his bowel obstruction -  Continue NPO, and NG to LIS.  NG tube may be clamped for oral medication administration and after administration of Gastrografin - Okay for the patient to have oral lozenges for sore throat related to NG tube - IV fluids per primary team - Monitor for return of bowel function - Continue Dulcolax suppositories - Continue to hold warfarin.  INR 2.6 from 2.2 yesterday.  Continue to monitor with daily labs - No acute surgical intervention at this time.  Hopefully patient is having resolution of his bowel obstruction and will not require surgical intervention during this admission - Appreciate hospitalist and GI recommendations   LOS: 2 days    Kyri Shader A Breylen Agyeman 02/29/2024  Note: Portions of this report may have been transcribed using voice recognition software. Every effort has been made to ensure accuracy; however, inadvertent computerized transcription errors may still be present.

## 2024-02-29 NOTE — Progress Notes (Addendum)
 PROGRESS NOTE  [name] WUX:[id number] DOB: 1931-09-22 DOA: 02/27/2024 PCP: Orest Bio, MD  Brief History:  89 year old male with a history of permanent atrial fibrillation on warfarin, nonobstructive CAD, chronic disease with chronic stricture, carcinoid tumor, GERD, hypothyroidism, and B12 deficiency presenting with nausea, abdominal pain, and diarrhea.  The patient had been in his usual state of health before developing nausea and diarrhea on 02/26/2024.  He had 5 loose bowel movements on 02/25/2024 without any blood.  After he took Pepto-Bismol x 2, he noted black stools.  He had 1 bowel movement that was loose prior to coming to the emergency room in the early morning 02/27/2024.  He is passing flatus presently.  He denies any new medications.  He denies any fevers but has some subjective chills.  He denies any headache, neck pain, chest pain, shortness of breath, coughing, hemoptysis.  There is no hematochezia.  There is no dysuria or hematuria. Notably, the patient had a recent EGD performed on 02/15/2024 which showed a 2 cm hiatal hernia, and a submucosal nodule in the duodenum NET.  Since then, he has restarted his warfarin.  His last warfarin dose was on the evening of 02/26/2024.  Because of worsening abdominal pain and distention and loose stool, he presented for further evaluation and treatment. In the ED, the patient was afebrile hemodynamically stable with oxygen  saturation 97% room air.  WBC 8.3, hemoglobin 13.2, platelets 159.  Sodium 139, potassium 3.8, bicarbonate 22, serum creatinine 1.50.  General surgery was consulted.  They recommended NG tube and holding warfarin for now.    Assessment/Plan: Partial small bowel obstruction - Patient is passing flatus - He had 1 loose bowel movement prior to coming to the emergency department in the early a.m. 02/27/2024 -02/27/2024 CT AP--diffusely dilated small bowel up to 4 cm down to the distal ileal surgical anastomosis;  diverticulosis; chronic bladder wall thickening; mild focal thickening and adjacent edema in the right upper quadrant - General Surgery consult appreciated - discussed with Dr. Leonce Ralph - 02/28/24--had one BM in afternoon -02/29/24--2 BMs - Optimize electrolytes - Changed medications to IV if possible - appreciate GI consult - continue IVF   Carcinoid tumor - Patient is scheduled for PET scan in near future - Follow-up with Dr. Cheree Cords   Permanent atrial fibrillation - Currently rate controlled - continue IV metoprolol until able to tolerate p.o. - Holding warfarin -INR 2.8>>2.3>>2.6  Hypophosphatemia -replete   Essential HTN -increased lopressor to 5 mg IV q 6 hrs   CKD 3b -baseline creatinine 1.2-1.5   Crohn's disease - The patient's next dose of Entyvio  is due on 03/03/2024 - Follow-up Dr. Sammi Crick   Nonobstructive CAD - No chest pain presently   Hypothyroidism - Plan to start IV Synthroid  if remains n.p.o. for prolonged period of time   GERD - Continue IV pantoprazole            Family Communication:   spouse at bedside 5/16   Consultants:  GI, gen surgery Code Status:  FULL    DVT Prophylaxis:  warfarin     Procedures: As Listed in Progress Note Above   Antibiotics: None        Subjective: Pt complains of occasional cough.  Denies n/v/d, abd pain.  Had 2 BMs.  No f/c  Objective: Vitals:   02/28/24 1245 02/28/24 2021 02/29/24 0408 02/29/24 1551  BP: (!) 161/93 (!) 174/86 (!) 168/95 (!) 161/102  Pulse: 83  91 (!) 102 65  Resp:  18 15   Temp: 98.2 F (36.8 C) 99.3 F (37.4 C) 98.7 F (37.1 C) 98.4 F (36.9 C)  TempSrc: Oral Oral Oral Oral  SpO2: 97% 95% 96% 96%  Weight:      Height:        Intake/Output Summary (Last 24 hours) at 02/29/2024 1753 Last data filed at 02/29/2024 1229 Gross per 24 hour  Intake 1785.55 ml  Output 1250 ml  Net 535.55 ml   Weight change:  Exam:  General:  Pt is alert, follows commands  appropriately, not in acute distress HEENT: No icterus, No thrush, No neck mass, [place]/AT Cardiovascular: RRR, S1/S2, no rubs, no gallops Respiratory: CTA bilaterally, no wheezing, no crackles, no rhonchi Abdomen: Soft/+BS, non tender, non distended, no guarding Extremities: No edema, No lymphangitis, No petechiae, No rashes, no synovitis   Data Reviewed: I have personally reviewed following labs and imaging studies Basic Metabolic Panel: Recent Labs  Lab 02/27/24 0537 02/27/24 0555 02/28/24 0457 02/29/24 0449  NA 138 139 139 136  K 3.9 3.8 4.0 3.8  CL 106 104 105 103  CO2 24  --  21* 21*  GLUCOSE 122* 120* 82 85  BUN 19 20 16 19   CREATININE 1.57* 1.50* 1.53* 1.24  CALCIUM 9.0  --  8.8* 8.5*  MG  --   --  1.7 1.7  PHOS  --   --  3.3 2.4*   Liver Function Tests: Recent Labs  Lab 02/25/24 1416 02/27/24 0537 02/28/24 0457  AST 20 20 18   ALT 16 15 15   ALKPHOS 59 49 60  BILITOT 0.6 1.0 1.7*  PROT 7.0 6.0* 6.3*  ALBUMIN 4.0 3.5 3.4*   Recent Labs  Lab 02/27/24 0537  LIPASE 38   No results for input(s): "AMMONIA" in the last 168 hours. Coagulation Profile: Recent Labs  Lab 02/27/24 0708 02/28/24 0457 02/29/24 0449  INR 2.8* 2.2* 2.6*   CBC: Recent Labs  Lab 02/27/24 0537 02/27/24 0555 02/28/24 0457 02/29/24 0449  WBC 8.3  --  10.9* 10.8*  NEUTROABS 6.0  --   --   --   HGB 13.2 12.6* 14.0 14.2  HCT 37.5* 37.0* 41.4 41.0  MCV 89.3  --  91.0 89.1  PLT 159  --  193 181   Cardiac Enzymes: No results for input(s): "CKTOTAL", "CKMB", "CKMBINDEX", "TROPONINI" in the last 168 hours. BNP: Invalid input(s): "POCBNP" CBG: No results for input(s): "GLUCAP" in the last 168 hours. HbA1C: No results for input(s): "HGBA1C" in the last 72 hours. Urine analysis:    Component Value Date/Time   COLORURINE YELLOW 12/06/2023 1236   APPEARANCEUR CLEAR 12/06/2023 1236   APPEARANCEUR Clear 03/16/2021 1450   LABSPEC 1.015 12/06/2023 1236   PHURINE 6.0 12/06/2023 1236    GLUCOSEU NEGATIVE 12/06/2023 1236   HGBUR SMALL (A) 12/06/2023 1236   BILIRUBINUR NEGATIVE 12/06/2023 1236   BILIRUBINUR Negative 03/16/2021 1450   KETONESUR NEGATIVE 12/06/2023 1236   PROTEINUR NEGATIVE 12/06/2023 1236   UROBILINOGEN negative (A) 03/23/2020 1506   NITRITE NEGATIVE 12/06/2023 1236   LEUKOCYTESUR NEGATIVE 12/06/2023 1236   Sepsis Labs: @LABRCNTIP (procalcitonin:4,lacticidven:4) ) Recent Results (from the past 240 hours)  Gastrointestinal Panel by PCR , Stool     Status: None   Collection Time: 02/28/24 12:45 PM   Specimen: Stool  Result Value Ref Range Status   Campylobacter species NOT DETECTED NOT DETECTED Final   Plesimonas shigelloides NOT DETECTED NOT DETECTED Final   Salmonella species  NOT DETECTED NOT DETECTED Final   Yersinia enterocolitica NOT DETECTED NOT DETECTED Final   Vibrio species NOT DETECTED NOT DETECTED Final   Vibrio cholerae NOT DETECTED NOT DETECTED Final   Enteroaggregative E coli (EAEC) NOT DETECTED NOT DETECTED Final   Enteropathogenic E coli (EPEC) NOT DETECTED NOT DETECTED Final   Enterotoxigenic E coli (ETEC) NOT DETECTED NOT DETECTED Final   Shiga like toxin producing E coli (STEC) NOT DETECTED NOT DETECTED Final   Shigella/Enteroinvasive E coli (EIEC) NOT DETECTED NOT DETECTED Final   Cryptosporidium NOT DETECTED NOT DETECTED Final   Cyclospora cayetanensis NOT DETECTED NOT DETECTED Final   Entamoeba histolytica NOT DETECTED NOT DETECTED Final   Giardia lamblia NOT DETECTED NOT DETECTED Final   Adenovirus F40/41 NOT DETECTED NOT DETECTED Final   Astrovirus NOT DETECTED NOT DETECTED Final   Norovirus GI/GII NOT DETECTED NOT DETECTED Final   Rotavirus A NOT DETECTED NOT DETECTED Final   Sapovirus (I, II, IV, and V) NOT DETECTED NOT DETECTED Final    Comment: Performed at [facility], [address] [place], [place] [address]  C Difficile Quick Screen w PCR reflex     Status: None   Collection Time: 02/28/24 12:45 PM    Specimen: STOOL  Result Value Ref Range Status   C Diff antigen NEGATIVE NEGATIVE Final   C Diff toxin NEGATIVE NEGATIVE Final   C Diff interpretation No C. difficile detected.  Final    Comment: Performed at [facility], [address], [place], [place] [address]     Scheduled Meds:  bisacodyl   10 mg Rectal Daily   metoprolol tartrate  5 mg Intravenous Q6H   pantoprazole  (PROTONIX ) IV  40 mg Intravenous Q12H   Continuous Infusions:  Procedures/Studies: DG Abd Portable 1V-Small Bowel Protocol-Position Verification Result Date: 02/29/2024 CLINICAL DATA:  NG placement. EXAM: PORTABLE ABDOMEN - 1 VIEW COMPARISON:  CT abdomen pelvis dated 02/27/2024. FINDINGS: Partially visualized enteric tube with tip and side port in the upper abdomen, likely in the proximal stomach. Mildly dilated small bowel loops measure up to 3 cm diameter. IMPRESSION: Enteric tube with tip and side port in the proximal stomach. Electronically Signed   By: Angus Bark M.D.   On: 02/29/2024 14:41   DG Chest Portable 1 View Result Date: 02/27/2024 EXAM: 1 VIEW XRAY OF THE CHEST 02/27/2024 08:02:00 AM COMPARISON: 12/06/2023 chest radiograph. CLINICAL HISTORY: NG placement. Pt states he had multiple episodes of diarrhea yesterday and today; pt states he feels very weak and is unable to walk; Pt c/o nausea but no vomiting; Pt had 2 doses of pepto bismol yesterday. Hx of afib, CAD, pneumonia. Non smoker. FINDINGS: LUNGS AND PLEURA: No consolidation. No pulmonary edema. No pleural effusion. No pneumothorax. HEART AND MEDIASTINUM: No acute abnormality of the cardiac and mediastinal silhouettes. BONES AND SOFT TISSUES: No acute osseous abnormality. LINES AND TUBES: Enteric tube tip just below the esophagogastric junction with side port in the lower thoracic esophagus. Recommended advancing 8 cm. SOFT TISSUES: Surgical clips in the right upper quadrant of the abdomen. IMPRESSION: 1. Enteric tube tip just below the esophagogastric  junction with side port in the lower thoracic esophagus. Recommend advancing 8 cm. 2. No active cardiopulmonary disease. Electronically signed by: Karlyn Overman MD 02/27/2024 08:20 AM EDT RP Workstation: [id number]   CT ABDOMEN PELVIS W CONTRAST Result Date: 02/27/2024 CLINICAL DATA:  Abdominal pain, diarrhea, and weakness. EXAM: CT ABDOMEN AND PELVIS WITH CONTRAST TECHNIQUE: Multidetector CT imaging of the abdomen and pelvis  was performed using the standard protocol following bolus administration of intravenous contrast. RADIATION DOSE REDUCTION: This exam was performed according to the departmental dose-optimization program which includes automated exposure control, adjustment of the mA and/or kV according to patient size and/or use of iterative reconstruction technique. CONTRAST:  100mL OMNIPAQUE  IOHEXOL  300 MG/ML  SOLN COMPARISON:  Numerous prior CTs back to 2009. The 2 most recent are both with contrast and dated 12/06/2023 and 12/22/2022. FINDINGS: Lower chest: There is scarring in the medial right lung base, linear atelectasis in the posterior left lower lobe. Lung bases are clear of infiltrates. Hepatobiliary: Chronic 8 mm too small to characterize hypodensity is unchanged in hepatic segment 6, probable small cyst. Remainder of liver is unremarkable. Gallbladder is absent without bile duct dilatation. Pancreas: No abnormality. Spleen: Stable 2.4 cm splenic cyst.  Otherwise unremarkable spleen. Adrenals/Urinary Tract: No adrenal mass. There is bilateral renal cortical thinning, scattered scarring on the left. There are few small bilateral renal cysts requiring no follow-up imaging. No mass enhancement is seen. There is no urinary stone or obstruction. There is mild chronic bladder thickening and trabeculation, small scattered diverticula. Findings likely due to chronic bladder outlet obstruction. No bladder mass is seen. Stomach/Bowel: Small hiatal hernia. Unremarkable contracted stomach. Beginning at the  ligament of [place], the small bowel is diffusely dilated up to 4 cm down to a distal ileal surgical anastomotic staple line in the anterior right upper to mid abdomen, past which the the more distal terminal ileum is collapsed. I do not see further areas of transition. Some of the right lower abdominal segments demonstrate mild fold thickening and adjacent edema but do not show pneumatosis. There are stones within a normal caliber appendix. The colon wall is normal in thickness. There is left-sided diverticulosis with no evidence of diverticulitis. Vascular/Lymphatic: Moderate aortoiliac calcific plaques. Infrarenal aortic ectasia again measures 2.5 cm. No other significant vascular findings.  No adenopathy. Reproductive: Slightly prominent prostate with TURP defect. Other: Bilateral inguinal fat hernias. Trace ascites in the right abdominal mesenteric folds. No free air. Musculoskeletal: Osteopenia with degenerative changes of the spine. Bridging osteophytes left SI joint. Bilateral hip DJD. No acute or other significant osseous findings. IMPRESSION: 1. Diffusely dilated small bowel up to 4 cm down to a distal ileal surgical anastomotic staple line in the anterior right upper to mid abdomen, past which the more distal terminal ileum is collapsed. Findings are consistent with small bowel obstruction. 2. Some of the right lower abdominal segments demonstrate mild fold thickening and adjacent edema but do not show pneumatosis. Early ischemia not excluded. 3. Trace ascites in the right abdominal mesenteric folds. No free air. 4. Diverticulosis without evidence of diverticulitis. 5. Aortic and coronary artery atherosclerosis. 6. Chronic bladder thickening and trabeculation with small diverticula, likely due to chronic bladder outlet obstruction. Query underlying cystitis. 7. Small hiatal hernia.  Bilateral inguinal fat hernias. 8. Osteopenia and degenerative change. Aortic Atherosclerosis (ICD10-I70.0). Electronically  Signed   By: Denman Fischer M.D.   On: 02/27/2024 06:42    Demaris Fillers, DO  [facility]  If 7PM-7AM, please contact night-coverage [email or web address] Password [email or web address] 02/29/2024, 5:53 PM   LOS: 2 days

## 2024-03-01 DIAGNOSIS — I4821 Permanent atrial fibrillation: Secondary | ICD-10-CM | POA: Diagnosis not present

## 2024-03-01 DIAGNOSIS — D696 Thrombocytopenia, unspecified: Secondary | ICD-10-CM | POA: Diagnosis not present

## 2024-03-01 DIAGNOSIS — K5000 Crohn's disease of small intestine without complications: Secondary | ICD-10-CM

## 2024-03-01 DIAGNOSIS — K56609 Unspecified intestinal obstruction, unspecified as to partial versus complete obstruction: Secondary | ICD-10-CM | POA: Diagnosis not present

## 2024-03-01 DIAGNOSIS — Z7901 Long term (current) use of anticoagulants: Secondary | ICD-10-CM | POA: Diagnosis not present

## 2024-03-01 LAB — MAGNESIUM: Magnesium: 2 mg/dL (ref 1.7–2.4)

## 2024-03-01 LAB — BASIC METABOLIC PANEL WITH GFR
Anion gap: 14 (ref 5–15)
BUN: 18 mg/dL (ref 8–23)
CO2: 21 mmol/L — ABNORMAL LOW (ref 22–32)
Calcium: 8.7 mg/dL — ABNORMAL LOW (ref 8.9–10.3)
Chloride: 100 mmol/L (ref 98–111)
Creatinine, Ser: 1.28 mg/dL — ABNORMAL HIGH (ref 0.61–1.24)
GFR, Estimated: 53 mL/min — ABNORMAL LOW (ref >60)
Glucose, Bld: 84 mg/dL (ref 70–99)
Potassium: 3.4 mmol/L — ABNORMAL LOW (ref 3.5–5.1)
Sodium: 135 mmol/L (ref 135–145)

## 2024-03-01 LAB — PROTIME-INR
INR: 2.3 — ABNORMAL HIGH (ref 0.8–1.2)
Prothrombin Time: 25.6 s — ABNORMAL HIGH (ref 11.4–15.2)

## 2024-03-01 LAB — PHOSPHORUS: Phosphorus: 2.3 mg/dL — ABNORMAL LOW (ref 2.5–4.6)

## 2024-03-01 MED ORDER — POLYETHYLENE GLYCOL 3350 17 G PO PACK
17.0000 g | PACK | Freq: Every day | ORAL | Status: DC
  Administered 2024-03-01 – 2024-03-02 (×2): 17 g via ORAL
  Filled 2024-03-01 (×3): qty 1

## 2024-03-01 MED ORDER — DOCUSATE SODIUM 100 MG PO CAPS
100.0000 mg | ORAL_CAPSULE | Freq: Every day | ORAL | Status: DC
  Administered 2024-03-01 – 2024-03-02 (×2): 100 mg via ORAL
  Filled 2024-03-01 (×3): qty 1

## 2024-03-01 MED ORDER — DILTIAZEM HCL 60 MG PO TABS
60.0000 mg | ORAL_TABLET | Freq: Four times a day (QID) | ORAL | Status: DC
  Administered 2024-03-01 – 2024-03-02 (×4): 60 mg via ORAL
  Filled 2024-03-01 (×4): qty 1

## 2024-03-01 MED ORDER — POTASSIUM PHOSPHATES 15 MMOLE/5ML IV SOLN
30.0000 mmol | Freq: Once | INTRAVENOUS | Status: AC
  Administered 2024-03-01: 30 mmol via INTRAVENOUS
  Filled 2024-03-01: qty 10

## 2024-03-01 MED ORDER — POTASSIUM CHLORIDE 10 MEQ/100ML IV SOLN
10.0000 meq | INTRAVENOUS | Status: AC
  Administered 2024-03-01 (×4): 10 meq via INTRAVENOUS
  Filled 2024-03-01 (×4): qty 100

## 2024-03-01 MED ORDER — ORAL CARE MOUTH RINSE: 15.0000 mL | OROMUCOSAL | Status: DC | PRN

## 2024-03-01 MED ORDER — BENZONATATE 100 MG PO CAPS
100.0000 mg | ORAL_CAPSULE | Freq: Three times a day (TID) | ORAL | Status: DC
Start: 2024-03-01 — End: 2024-03-03
  Administered 2024-03-01 – 2024-03-03 (×6): 100 mg via ORAL
  Filled 2024-03-01 (×7): qty 1

## 2024-03-01 NOTE — Progress Notes (Signed)
 [facility] Progress Note     Subjective: Patient seen and examined.  He is resting comfortably in bed.  He denies any abdominal pain, nausea, and vomiting.  He has had multiple loose bowel movements and is passing a large amount of gas.  He tolerated his clear liquids without nausea and vomiting.  Objective: Vital signs in last 24 hours: Temp:  [98.4 F (36.9 C)-99.6 F (37.6 C)] 99.6 F (37.6 C) (05/17 0859) Pulse Rate:  [53-108] 108 (05/17 0859) Resp:  [14] 14 (05/17 0429) BP: (155-174)/(88-118) 160/96 (05/17 0859) SpO2:  [95 %-97 %] 97 % (05/17 0859) Last BM Date : 03/01/24  Intake/Output from previous day: 05/16 0701 - 05/17 0700 In: 571.7 [I.V.:561.3; IV Piggyback:10.4] Out: 800 [Urine:300; Emesis/NG output:500] Intake/Output this shift: Total I/O In: 400 [P.O.:400] Out: -   General appearance: alert, cooperative, and no distress GI: Abdomen soft, nondistended, no percussion tenderness, nontender to palpation; no rigidity, guarding, rebound tenderness  Lab Results:  Recent Labs    02/28/24 0457 02/29/24 0449  WBC 10.9* 10.8*  HGB 14.0 14.2  HCT 41.4 41.0  PLT 193 181   BMET Recent Labs    02/29/24 0449 03/01/24 0421  NA 136 135  K 3.8 3.4*  CL 103 100  CO2 21* 21*  GLUCOSE 85 84  BUN 19 18  CREATININE 1.24 1.28*  CALCIUM 8.5* 8.7*   PT/INR Recent Labs    02/29/24 0449 03/01/24 0421  LABPROT 27.8* 25.6*  INR 2.6* 2.3*    Studies/Results: DG Abd Portable 1V-Small Bowel Obstruction Protocol-initial, 8 hr delay Result Date: 02/29/2024 CLINICAL DATA:  8 hour follow-up EXAM: PORTABLE ABDOMEN - 1 VIEW COMPARISON:  02/29/2024, 11:43 a.m. FINDINGS: The bowel gas pattern is normal. No radio-opaque calculi or other significant radiographic abnormality are seen. Oral contrast in colon and rectosigmoid. IMPRESSION: Oral contrast in colon and rectosigmoid.  No bowel dilatation. Electronically Signed   By: Sydell Eva M.D.   On:  02/29/2024 22:33   DG Abd Portable 1V-Small Bowel Protocol-Position Verification Result Date: 02/29/2024 CLINICAL DATA:  NG placement. EXAM: PORTABLE ABDOMEN - 1 VIEW COMPARISON:  CT abdomen pelvis dated 02/27/2024. FINDINGS: Partially visualized enteric tube with tip and side port in the upper abdomen, likely in the proximal stomach. Mildly dilated small bowel loops measure up to 3 cm diameter. IMPRESSION: Enteric tube with tip and side port in the proximal stomach. Electronically Signed   By: Angus Bark M.D.   On: 02/29/2024 14:41    Anti-infectives: Anti-infectives (From admission, onward)    None       Assessment/Plan:  Patient is a 89 year old male who was admitted with a small bowel obstruction  - Small bowel obstruction protocol yesterday demonstrated contrast throughout the colon and rectum - He tolerated clear liquids this morning - Advance to soft diet - Can likely Hep-Lock IV fluids - Monitor bowel function - Will order a bowel regimen for the patient.  Continue Dulcolax suppositories - Continue to hold warfarin until patient tolerates solid food - Patient will be stable for discharge from surgical standpoint if he tolerates his diet without nausea and vomiting - Appreciate hospitalist and GI recommendations   LOS: 3 days    Tulani Kidney A Treyshaun Keatts 03/01/2024  Note: Portions of this report may have been transcribed using voice recognition software. Every effort has been made to ensure accuracy; however, inadvertent computerized transcription errors may still be present.

## 2024-03-01 NOTE — Plan of Care (Signed)

## 2024-03-01 NOTE — Progress Notes (Signed)
 PROGRESS NOTE  [name] ZOX:[id number] DOB: 01/07/1931 DOA: 02/27/2024 PCP: Orest Bio, MD  Brief History:  89 year old male with a history of permanent atrial fibrillation on warfarin, nonobstructive CAD, chronic disease with chronic stricture, carcinoid tumor, GERD, hypothyroidism, and B12 deficiency presenting with nausea, abdominal pain, and diarrhea.  The patient had been in his usual state of health before developing nausea and diarrhea on 02/26/2024.  He had 5 loose bowel movements on 02/25/2024 without any blood.  After he took Pepto-Bismol x 2, he noted black stools.  He had 1 bowel movement that was loose prior to coming to the emergency room in the early morning 02/27/2024.  He is passing flatus presently.  He denies any new medications.  He denies any fevers but has some subjective chills.  He denies any headache, neck pain, chest pain, shortness of breath, coughing, hemoptysis.  There is no hematochezia.  There is no dysuria or hematuria. Notably, the patient had a recent EGD performed on 02/15/2024 which showed a 2 cm hiatal hernia, and a submucosal nodule in the duodenum NET.  Since then, he has restarted his warfarin.  His last warfarin dose was on the evening of 02/26/2024.  Because of worsening abdominal pain and distention and loose stool, he presented for further evaluation and treatment. In the ED, the patient was afebrile hemodynamically stable with oxygen  saturation 97% room air.  WBC 8.3, hemoglobin 13.2, platelets 159.  Sodium 139, potassium 3.8, bicarbonate 22, serum creatinine 1.50.  General surgery was consulted.  They recommended NG tube and holding warfarin for now.    Assessment/Plan: Partial small bowel obstruction - Patient is passing flatus - He had 1 loose bowel movement prior to coming to the emergency department in the early a.m. 02/27/2024 -02/27/2024 CT AP--diffusely dilated small bowel up to 4 cm down to the distal ileal surgical anastomosis;  diverticulosis; chronic bladder wall thickening; mild focal thickening and adjacent edema in the right upper quadrant - General Surgery consult appreciated - discussed with Dr. Leonce Ralph - 02/28/24--had one BM in afternoon -02/29/24--2 BMs -03/01/24--had one BM; NG removed, started on clears - Optimize electrolytes - Changed medications to IV if possible - appreciate GI consult - continue IVF   Carcinoid tumor - Patient is scheduled for PET scan in near future - Follow-up with Dr. Cheree Cords   Permanent atrial fibrillation with RVR - am 5/17--pt developed mild RVR - start po dilitiazem 60 po q 6 hrs  -if remains in RVR--will transfer to SDU - continue IV metoprolol  until able to tolerate p.o. - Holding warfarin -INR 2.8>>2.3>>2.6>>2.3 -personally reviewed EKG--afib, nonspecific TWI   Hypophosphatemia/Hypokalemia -replete   Essential HTN -continue lopressor  to 5 mg IV q 6 hrs for now until reliably takine po   CKD 3b -baseline creatinine 1.2-1.5   Crohn's disease - The patient's next dose of Entyvio  is due on 03/03/2024 - Follow-up Dr. Sammi Crick   Nonobstructive CAD - No chest pain presently   Hypothyroidism - Plan to start IV Synthroid  if remains n.p.o. for prolonged period of time   GERD - Continue IV pantoprazole            Family Communication:   spouse at bedside 5/17   Consultants:  GI, gen surgery Code Status:  FULL    DVT Prophylaxis:  warfarin     Procedures: As Listed in Progress Note Above   Antibiotics: None         Subjective: Patient  denies fevers, chills, headache, chest pain, dyspnea, nausea, vomiting, diarrhea, abdominal pain, dysuria, hematuria, hematochezia, and melena.   Objective: Vitals:   02/29/24 1551 02/29/24 2013 03/01/24 0429 03/01/24 0859  BP: (!) 161/102 (!) 174/118 (!) 155/88 (!) 160/96  Pulse: 65 (!) 53 84 (!) 108  Resp:   14   Temp: 98.4 F (36.9 C) 98.5 F (36.9 C) 98.8 F (37.1 C) 99.6 F (37.6 C)   TempSrc: Oral Oral Oral Oral  SpO2: 96% 95% 96% 97%  Weight:      Height:        Intake/Output Summary (Last 24 hours) at 03/01/2024 1007 Last data filed at 03/01/2024 0120 Gross per 24 hour  Intake 420.41 ml  Output 500 ml  Net -79.59 ml   Weight change:  Exam:  General:  Pt is alert, follows commands appropriately, not in acute distress HEENT: No icterus, No thrush, No neck mass, [place]/AT Cardiovascular: IRRR, S1/S2, no rubs, no gallops Respiratory: CTA bilaterally, no wheezing, no crackles, no rhonchi Abdomen: Soft/+BS, non tender, non distended, no guarding Extremities: No edema, No lymphangitis, No petechiae, No rashes, no synovitis   Data Reviewed: I have personally reviewed following labs and imaging studies Basic Metabolic Panel: Recent Labs  Lab 02/27/24 0537 02/27/24 0555 02/28/24 0457 02/29/24 0449 03/01/24 0421  NA 138 139 139 136 135  K 3.9 3.8 4.0 3.8 3.4*  CL 106 104 105 103 100  CO2 24  --  21* 21* 21*  GLUCOSE 122* 120* 82 85 84  BUN 19 20 16 19 18   CREATININE 1.57* 1.50* 1.53* 1.24 1.28*  CALCIUM 9.0  --  8.8* 8.5* 8.7*  MG  --   --  1.7 1.7 2.0  PHOS  --   --  3.3 2.4* 2.3*   Liver Function Tests: Recent Labs  Lab 02/25/24 1416 02/27/24 0537 02/28/24 0457  AST 20 20 18   ALT 16 15 15   ALKPHOS 59 49 60  BILITOT 0.6 1.0 1.7*  PROT 7.0 6.0* 6.3*  ALBUMIN 4.0 3.5 3.4*   Recent Labs  Lab 02/27/24 0537  LIPASE 38   No results for input(s): "AMMONIA" in the last 168 hours. Coagulation Profile: Recent Labs  Lab 02/27/24 0708 02/28/24 0457 02/29/24 0449 03/01/24 0421  INR 2.8* 2.2* 2.6* 2.3*   CBC: Recent Labs  Lab 02/27/24 0537 02/27/24 0555 02/28/24 0457 02/29/24 0449  WBC 8.3  --  10.9* 10.8*  NEUTROABS 6.0  --   --   --   HGB 13.2 12.6* 14.0 14.2  HCT 37.5* 37.0* 41.4 41.0  MCV 89.3  --  91.0 89.1  PLT 159  --  193 181   Cardiac Enzymes: No results for input(s): "CKTOTAL", "CKMB", "CKMBINDEX", "TROPONINI" in the last  168 hours. BNP: Invalid input(s): "POCBNP" CBG: No results for input(s): "GLUCAP" in the last 168 hours. HbA1C: No results for input(s): "HGBA1C" in the last 72 hours. Urine analysis:    Component Value Date/Time   COLORURINE YELLOW 12/06/2023 1236   APPEARANCEUR CLEAR 12/06/2023 1236   APPEARANCEUR Clear 03/16/2021 1450   LABSPEC 1.015 12/06/2023 1236   PHURINE 6.0 12/06/2023 1236   GLUCOSEU NEGATIVE 12/06/2023 1236   HGBUR SMALL (A) 12/06/2023 1236   BILIRUBINUR NEGATIVE 12/06/2023 1236   BILIRUBINUR Negative 03/16/2021 1450   KETONESUR NEGATIVE 12/06/2023 1236   PROTEINUR NEGATIVE 12/06/2023 1236   UROBILINOGEN negative (A) 03/23/2020 1506   NITRITE NEGATIVE 12/06/2023 1236   LEUKOCYTESUR NEGATIVE 12/06/2023 1236   Sepsis Labs: @LABRCNTIP (procalcitonin:4,lacticidven:4) )  Recent Results (from the past 240 hours)  Gastrointestinal Panel by PCR , Stool     Status: None   Collection Time: 02/28/24 12:45 PM   Specimen: Stool  Result Value Ref Range Status   Campylobacter species NOT DETECTED NOT DETECTED Final   Plesimonas shigelloides NOT DETECTED NOT DETECTED Final   Salmonella species NOT DETECTED NOT DETECTED Final   Yersinia enterocolitica NOT DETECTED NOT DETECTED Final   Vibrio species NOT DETECTED NOT DETECTED Final   Vibrio cholerae NOT DETECTED NOT DETECTED Final   Enteroaggregative E coli (EAEC) NOT DETECTED NOT DETECTED Final   Enteropathogenic E coli (EPEC) NOT DETECTED NOT DETECTED Final   Enterotoxigenic E coli (ETEC) NOT DETECTED NOT DETECTED Final   Shiga like toxin producing E coli (STEC) NOT DETECTED NOT DETECTED Final   Shigella/Enteroinvasive E coli (EIEC) NOT DETECTED NOT DETECTED Final   Cryptosporidium NOT DETECTED NOT DETECTED Final   Cyclospora cayetanensis NOT DETECTED NOT DETECTED Final   Entamoeba histolytica NOT DETECTED NOT DETECTED Final   Giardia lamblia NOT DETECTED NOT DETECTED Final   Adenovirus F40/41 NOT DETECTED NOT DETECTED Final    Astrovirus NOT DETECTED NOT DETECTED Final   Norovirus GI/GII NOT DETECTED NOT DETECTED Final   Rotavirus A NOT DETECTED NOT DETECTED Final   Sapovirus (I, II, IV, and V) NOT DETECTED NOT DETECTED Final    Comment: Performed at [facility], [address] [place], [place] [address]  C Difficile Quick Screen w PCR reflex     Status: None   Collection Time: 02/28/24 12:45 PM   Specimen: STOOL  Result Value Ref Range Status   C Diff antigen NEGATIVE NEGATIVE Final   C Diff toxin NEGATIVE NEGATIVE Final   C Diff interpretation No C. difficile detected.  Final    Comment: Performed at [facility], [address], [place], [place] [address]     Scheduled Meds:  bisacodyl   10 mg Rectal Daily   diltiazem   60 mg Oral Q6H   metoprolol  tartrate  5 mg Intravenous Q6H   pantoprazole  (PROTONIX ) IV  40 mg Intravenous Q12H   Continuous Infusions:  lactated ringers  Stopped (03/01/24 0509)   potassium chloride  10 mEq (03/01/24 1100)   potassium PHOSPHATE IVPB (in mmol)      Procedures/Studies: DG Abd Portable 1V-Small Bowel Obstruction Protocol-initial, 8 hr delay Result Date: 02/29/2024 CLINICAL DATA:  8 hour follow-up EXAM: PORTABLE ABDOMEN - 1 VIEW COMPARISON:  02/29/2024, 11:43 a.m. FINDINGS: The bowel gas pattern is normal. No radio-opaque calculi or other significant radiographic abnormality are seen. Oral contrast in colon and rectosigmoid. IMPRESSION: Oral contrast in colon and rectosigmoid.  No bowel dilatation. Electronically Signed   By: Sydell Eva M.D.   On: 02/29/2024 22:33   DG Abd Portable 1V-Small Bowel Protocol-Position Verification Result Date: 02/29/2024 CLINICAL DATA:  NG placement. EXAM: PORTABLE ABDOMEN - 1 VIEW COMPARISON:  CT abdomen pelvis dated 02/27/2024. FINDINGS: Partially visualized enteric tube with tip and side port in the upper abdomen, likely in the proximal stomach. Mildly dilated small bowel loops measure up to 3 cm diameter. IMPRESSION: Enteric  tube with tip and side port in the proximal stomach. Electronically Signed   By: Angus Bark M.D.   On: 02/29/2024 14:41   DG Chest Portable 1 View Result Date: 02/27/2024 EXAM: 1 VIEW XRAY OF THE CHEST 02/27/2024 08:02:00 AM COMPARISON: 12/06/2023 chest radiograph. CLINICAL HISTORY: NG placement. Pt states he had multiple episodes of diarrhea yesterday and today; pt states he  feels very weak and is unable to walk; Pt c/o nausea but no vomiting; Pt had 2 doses of pepto bismol yesterday. Hx of afib, CAD, pneumonia. Non smoker. FINDINGS: LUNGS AND PLEURA: No consolidation. No pulmonary edema. No pleural effusion. No pneumothorax. HEART AND MEDIASTINUM: No acute abnormality of the cardiac and mediastinal silhouettes. BONES AND SOFT TISSUES: No acute osseous abnormality. LINES AND TUBES: Enteric tube tip just below the esophagogastric junction with side port in the lower thoracic esophagus. Recommended advancing 8 cm. SOFT TISSUES: Surgical clips in the right upper quadrant of the abdomen. IMPRESSION: 1. Enteric tube tip just below the esophagogastric junction with side port in the lower thoracic esophagus. Recommend advancing 8 cm. 2. No active cardiopulmonary disease. Electronically signed by: Karlyn Overman MD 02/27/2024 08:20 AM EDT RP Workstation: [id number]   CT ABDOMEN PELVIS W CONTRAST Result Date: 02/27/2024 CLINICAL DATA:  Abdominal pain, diarrhea, and weakness. EXAM: CT ABDOMEN AND PELVIS WITH CONTRAST TECHNIQUE: Multidetector CT imaging of the abdomen and pelvis was performed using the standard protocol following bolus administration of intravenous contrast. RADIATION DOSE REDUCTION: This exam was performed according to the departmental dose-optimization program which includes automated exposure control, adjustment of the mA and/or kV according to patient size and/or use of iterative reconstruction technique. CONTRAST:  OMNIPAQUE  IOHEXOL  300 MG/ML  SOLN COMPARISON:  Numerous prior CTs back to  2009. The 2 most recent are both with contrast and dated 12/06/2023 and 12/22/2022. FINDINGS: Lower chest: There is scarring in the medial right lung base, linear atelectasis in the posterior left lower lobe. Lung bases are clear of infiltrates. Hepatobiliary: Chronic 8 mm too small to characterize hypodensity is unchanged in hepatic segment 6, probable small cyst. Remainder of liver is unremarkable. Gallbladder is absent without bile duct dilatation. Pancreas: No abnormality. Spleen: Stable 2.4 cm splenic cyst.  Otherwise unremarkable spleen. Adrenals/Urinary Tract: No adrenal mass. There is bilateral renal cortical thinning, scattered scarring on the left. There are few small bilateral renal cysts requiring no follow-up imaging. No mass enhancement is seen. There is no urinary stone or obstruction. There is mild chronic bladder thickening and trabeculation, small scattered diverticula. Findings likely due to chronic bladder outlet obstruction. No bladder mass is seen. Stomach/Bowel: Small hiatal hernia. Unremarkable contracted stomach. Beginning at the ligament of Treitz, the small bowel is diffusely dilated up to 4 cm down to a distal ileal surgical anastomotic staple line in the anterior right upper to mid abdomen, past which the the more distal terminal ileum is collapsed. I do not see further areas of transition. Some of the right lower abdominal segments demonstrate mild fold thickening and adjacent edema but do not show pneumatosis. There are stones within a normal caliber appendix. The colon wall is normal in thickness. There is left-sided diverticulosis with no evidence of diverticulitis. Vascular/Lymphatic: Moderate aortoiliac calcific plaques. Infrarenal aortic ectasia again measures 2.5 cm. No other significant vascular findings.  No adenopathy. Reproductive: Slightly prominent prostate with TURP defect. Other: Bilateral inguinal fat hernias. Trace ascites in the right abdominal mesenteric folds. No  free air. Musculoskeletal: Osteopenia with degenerative changes of the spine. Bridging osteophytes left SI joint. Bilateral hip DJD. No acute or other significant osseous findings. IMPRESSION: 1. Diffusely dilated small bowel up to 4 cm down to a distal ileal surgical anastomotic staple line in the anterior right upper to mid abdomen, past which the more distal terminal ileum is collapsed. Findings are consistent with small bowel obstruction. 2. Some of the right lower abdominal segments  demonstrate mild fold thickening and adjacent edema but do not show pneumatosis. Early ischemia not excluded. 3. Trace ascites in the right abdominal mesenteric folds. No free air. 4. Diverticulosis without evidence of diverticulitis. 5. Aortic and coronary artery atherosclerosis. 6. Chronic bladder thickening and trabeculation with small diverticula, likely due to chronic bladder outlet obstruction. Query underlying cystitis. 7. Small hiatal hernia.  Bilateral inguinal fat hernias. 8. Osteopenia and degenerative change. Aortic Atherosclerosis (ICD10-I70.0). Electronically Signed   By: Denman Fischer M.D.   On: 02/27/2024 06:42    Demaris Fillers, DO  [facility]  If 7PM-7AM, please contact night-coverage [email or web address] Password [email or web address] 03/01/2024, 10:07 AM   LOS: 3 days

## 2024-03-01 NOTE — Progress Notes (Signed)
 Small bowel protocol plain film yesterday showed contrast in the colon and rectum.  No bowel dilation.    NG out  flatus and BM overnight Tolerating a clear liquid diet.  Vital signs in last 24 hours: Temp:  [98.4 F (36.9 C)-99.6 F (37.6 C)] 99.6 F (37.6 C) (05/17 0859) Pulse Rate:  [53-108] 108 (05/17 0859) Resp:  [14] 14 (05/17 0429) BP: (155-174)/(88-118) 160/96 (05/17 0859) SpO2:  [95 %-97 %] 97 % (05/17 0859) Last BM Date : 03/01/24 General:   Alert,   appears comfortable in no acute distress.  Accompanied by multiple family members. Abdomen:    Nondistended positive bowel sounds soft and nontender     Intake/Output from previous day: 05/16 0701 - 05/17 0700 In: 571.7 [I.V.:561.3; IV Piggyback:10.4] Out: 800 [Urine:300; Emesis/NG output:500] Intake/Output this shift: Total I/O In: 400 [P.O.:400] Out: -   Lab Results: Recent Labs    02/28/24 0457 02/29/24 0449  WBC 10.9* 10.8*  HGB 14.0 14.2  HCT 41.4 41.0  PLT 193 181   BMET Recent Labs    02/28/24 0457 02/29/24 0449 03/01/24 0421  NA 139 136 135  K 4.0 3.8 3.4*  CL 105 103 100  CO2 21* 21* 21*  GLUCOSE 82 85 84  BUN 16 19 18   CREATININE 1.53* 1.24 1.28*  CALCIUM 8.8* 8.5* 8.7*   LFT Recent Labs    02/28/24 0457  PROT 6.3*  ALBUMIN 3.4*  AST 18  ALT 15  ALKPHOS 60  BILITOT 1.7*   PT/INR Recent Labs    02/29/24 0449 03/01/24 0421  LABPROT 27.8* 25.6*  INR 2.6* 2.3*   Hepatitis Panel No results for input(s): "HEPBSAG", "HCVAB", "HEPAIGM", "HEPBIGM" in the last 72 hours. C-Diff Recent Labs    02/28/24 1245  CDIFFTOX NEGATIVE   Impression / Recommendations:    89 year old gentleman with longstanding fibro-stenosing small bowel Crohn's disease presents with small bowel obstruction.  It appears small bowel is now opened up with NG tube decompression. Tolerating clear liquid diet. May be ready to go to a low residue diet later today per  general surgery's recommendations.  -  resume Entyvio  next week  - consider ileocolonoscopy at a later date per Dr. Sammi Crick - further evaluation of small bowel carcinoid as planned

## 2024-03-02 DIAGNOSIS — I4821 Permanent atrial fibrillation: Secondary | ICD-10-CM | POA: Diagnosis not present

## 2024-03-02 DIAGNOSIS — K5000 Crohn's disease of small intestine without complications: Secondary | ICD-10-CM | POA: Diagnosis not present

## 2024-03-02 DIAGNOSIS — D696 Thrombocytopenia, unspecified: Secondary | ICD-10-CM | POA: Diagnosis not present

## 2024-03-02 DIAGNOSIS — K56609 Unspecified intestinal obstruction, unspecified as to partial versus complete obstruction: Secondary | ICD-10-CM | POA: Diagnosis not present

## 2024-03-02 LAB — BASIC METABOLIC PANEL WITH GFR
Anion gap: 8 (ref 5–15)
BUN: 17 mg/dL (ref 8–23)
CO2: 23 mmol/L (ref 22–32)
Calcium: 8.3 mg/dL — ABNORMAL LOW (ref 8.9–10.3)
Chloride: 102 mmol/L (ref 98–111)
Creatinine, Ser: 1.21 mg/dL (ref 0.61–1.24)
GFR, Estimated: 57 mL/min — ABNORMAL LOW (ref >60)
Glucose, Bld: 106 mg/dL — ABNORMAL HIGH (ref 70–99)
Potassium: 3.5 mmol/L (ref 3.5–5.1)
Sodium: 133 mmol/L — ABNORMAL LOW (ref 135–145)

## 2024-03-02 LAB — CBC
HCT: 38.9 % — ABNORMAL LOW (ref 39.0–52.0)
Hemoglobin: 13.7 g/dL (ref 13.0–17.0)
MCH: 31.3 pg (ref 26.0–34.0)
MCHC: 35.2 g/dL (ref 30.0–36.0)
MCV: 88.8 fL (ref 80.0–100.0)
Platelets: 169 10*3/uL (ref 150–400)
RBC: 4.38 MIL/uL (ref 4.22–5.81)
RDW: 13.4 % (ref 11.5–15.5)
WBC: 10 10*3/uL (ref 4.0–10.5)
nRBC: 0 % (ref 0.0–0.2)

## 2024-03-02 LAB — PROTIME-INR
INR: 1.9 — ABNORMAL HIGH (ref 0.8–1.2)
Prothrombin Time: 22.2 s — ABNORMAL HIGH (ref 11.4–15.2)

## 2024-03-02 LAB — MAGNESIUM: Magnesium: 1.5 mg/dL — ABNORMAL LOW (ref 1.7–2.4)

## 2024-03-02 LAB — PHOSPHORUS: Phosphorus: 2.6 mg/dL (ref 2.5–4.6)

## 2024-03-02 MED ORDER — DILTIAZEM HCL ER COATED BEADS 180 MG PO CP24
360.0000 mg | ORAL_CAPSULE | Freq: Every day | ORAL | Status: DC
  Administered 2024-03-02 – 2024-03-03 (×2): 360 mg via ORAL
  Filled 2024-03-02 (×2): qty 2

## 2024-03-02 MED ORDER — METOPROLOL TARTRATE 25 MG PO TABS
25.0000 mg | ORAL_TABLET | Freq: Four times a day (QID) | ORAL | Status: DC
  Administered 2024-03-02 – 2024-03-03 (×4): 25 mg via ORAL
  Filled 2024-03-02 (×4): qty 1

## 2024-03-02 MED ORDER — WARFARIN SODIUM 2.5 MG PO TABS
2.5000 mg | ORAL_TABLET | Freq: Once | ORAL | Status: AC
  Administered 2024-03-02: 2.5 mg via ORAL
  Filled 2024-03-02: qty 1

## 2024-03-02 MED ORDER — WARFARIN - PHARMACIST DOSING INPATIENT: Freq: Every day | Status: DC

## 2024-03-02 MED ORDER — PANTOPRAZOLE SODIUM 40 MG PO TBEC
40.0000 mg | DELAYED_RELEASE_TABLET | Freq: Two times a day (BID) | ORAL | Status: DC
  Administered 2024-03-02 – 2024-03-03 (×2): 40 mg via ORAL
  Filled 2024-03-02 (×3): qty 1

## 2024-03-02 MED ORDER — CALCIUM CARBONATE ANTACID 500 MG PO CHEW
1.0000 | CHEWABLE_TABLET | Freq: Once | ORAL | Status: AC
Start: 2024-03-02 — End: 2024-03-02
  Administered 2024-03-02: 200 mg via ORAL
  Filled 2024-03-02: qty 1

## 2024-03-02 MED ORDER — MAGNESIUM SULFATE 2 GM/50ML IV SOLN
2.0000 g | Freq: Once | INTRAVENOUS | Status: AC
  Administered 2024-03-02: 2 g via INTRAVENOUS
  Filled 2024-03-02: qty 50

## 2024-03-02 MED ORDER — POTASSIUM CHLORIDE CRYS ER 20 MEQ PO TBCR
40.0000 meq | EXTENDED_RELEASE_TABLET | Freq: Once | ORAL | Status: AC
  Administered 2024-03-02: 40 meq via ORAL
  Filled 2024-03-02: qty 2

## 2024-03-02 NOTE — Progress Notes (Signed)
 [facility] Progress Note     Subjective: Patient seen and examined.  He is resting comfortably in bed.  He tolerated his diet without nausea and vomiting, and is moving his bowels without issue.  He also is passing a large amount of flatus.  Objective: Vital signs in last 24 hours: Temp:  [99.5 F (37.5 C)-99.6 F (37.6 C)] 99.5 F (37.5 C) (05/18 0422) Pulse Rate:  [108-109] 108 (05/18 0422) Resp:  [20] 20 (05/18 1111) BP: (118-148)/(67-111) 118/67 (05/18 0422) SpO2:  [96 %-97 %] 96 % (05/18 0422) Last BM Date : 03/02/24  Intake/Output from previous day: 05/17 0701 - 05/18 0700 In: 1879 [P.O.:1080; I.V.:486.2; IV Piggyback:312.9] Out: -  Intake/Output this shift: Total I/O In: 240 [P.O.:240] Out: -   General appearance: alert, cooperative, and no distress GI: Abdomen soft, nondistended, no percussion tenderness, nontender to palpation; no rigidity, guarding, rebound tenderness  Lab Results:  Recent Labs    02/29/24 0449 03/02/24 0513  WBC 10.8* 10.0  HGB 14.2 13.7  HCT 41.0 38.9*  PLT 181 169   BMET Recent Labs    03/01/24 0421 03/02/24 0513  NA 135 133*  K 3.4* 3.5  CL 100 102  CO2 21* 23  GLUCOSE 84 106*  BUN 18 17  CREATININE 1.28* 1.21  CALCIUM 8.7* 8.3*   PT/INR Recent Labs    03/01/24 0421 03/02/24 0513  LABPROT 25.6* 22.2*  INR 2.3* 1.9*    Studies/Results: DG Abd Portable 1V-Small Bowel Obstruction Protocol-initial, 8 hr delay Result Date: 02/29/2024 CLINICAL DATA:  8 hour follow-up EXAM: PORTABLE ABDOMEN - 1 VIEW COMPARISON:  02/29/2024, 11:43 a.m. FINDINGS: The bowel gas pattern is normal. No radio-opaque calculi or other significant radiographic abnormality are seen. Oral contrast in colon and rectosigmoid. IMPRESSION: Oral contrast in colon and rectosigmoid.  No bowel dilatation. Electronically Signed   By: Sydell Eva M.D.   On: 02/29/2024 22:33   DG Abd Portable 1V-Small Bowel Protocol-Position  Verification Result Date: 02/29/2024 CLINICAL DATA:  NG placement. EXAM: PORTABLE ABDOMEN - 1 VIEW COMPARISON:  CT abdomen pelvis dated 02/27/2024. FINDINGS: Partially visualized enteric tube with tip and side port in the upper abdomen, likely in the proximal stomach. Mildly dilated small bowel loops measure up to 3 cm diameter. IMPRESSION: Enteric tube with tip and side port in the proximal stomach. Electronically Signed   By: Angus Bark M.D.   On: 02/29/2024 14:41    Anti-infectives: Anti-infectives (From admission, onward)    None       Assessment/Plan:  Patient is a 89 year old male who was admitted with a small bowel obstruction  - Patient tolerated soft diet without nausea and vomiting - Monitor bowel function - Continue bowel regimen - Okay to restart warfarin from a surgical standpoint - No need for surgical intervention at this time - Patient stable for discharge from general surgery standpoint - Appreciate hospitalist and GI recommendations   LOS: 4 days    Alexys Lobello A Sayda Grable 03/02/2024  Note: Portions of this report may have been transcribed using voice recognition software. Every effort has been made to ensure accuracy; however, inadvertent computerized transcription errors may still be present.

## 2024-03-02 NOTE — Progress Notes (Signed)
 PHARMACY - ANTICOAGULATION CONSULT NOTE  Pharmacy Consult for Warfarin Indication: atrial fibrillation  No Known Allergies  Patient Measurements: Height: 5\' 8"  (172.7 cm) Weight: 72.7 kg (160 lb 4.4 oz) IBW/kg (Calculated) : 68.4 HEPARIN DW (KG): 72.7  Vital Signs: Temp: 97.9 F (36.6 C) (05/18 1500) Temp Source: Oral (05/18 1500) BP: 134/81 (05/18 1500) Pulse Rate: 105 (05/18 1500)  Labs: Recent Labs    02/29/24 0449 03/01/24 0421 03/02/24 0513  HGB 14.2  --  13.7  HCT 41.0  --  38.9*  PLT 181  --  169  LABPROT 27.8* 25.6* 22.2*  INR 2.6* 2.3* 1.9*  CREATININE 1.24 1.28* 1.21    Estimated Creatinine Clearance: 39.3 mL/min (by C-G formula based on SCr of 1.21 mg/dL).   Medical History: Past Medical History:  Diagnosis Date   Anemia    Atrial fibrillation (HCC)    BPH (benign prostatic hyperplasia)    CAD (coronary artery disease)    Catheterization 2004, mild/moderate nonobstructive disease  /   nuclear, 2007, small inferior scar // no ischemia   Crohn's disease (HCC)    Elevated PSA    GERD (gastroesophageal reflux disease)    History of kidney stones    History of pneumonia    Hypothyroidism    Mitral regurgitation    Pneumonia    SBO (small bowel obstruction) (HCC)    Urinary retention    Wears glasses     Medications:  Warfarin 2.5 mg daily except 3.75 mg on Mondays   Assessment: Patient is a 89 year old who presented with nausea, abdominal pain, distension, and diarrhea that started 5/13. General surgery was consulted upon admission and recommended NG tube and patient's home warfarin for Afib due to partial SBO. Patient is now being restarted on home warfarin. Hgb 13.7. PLT 169.  Home warfarin dose: 2.5 mg daily except 3.75 mg on Mondays  Last dose: 5/13 @ 12pm INR goal: 2-3 Baseline INR: 1.9 (5/18)  Date INR Warfarin Dose  5/18 1.9 2.5 mg         Goal of Therapy:  INR 2-3 Monitor platelets by anticoagulation protocol: Yes   Plan:   Baseline INR slightly subtherapeutic, likely due to being held since 5/14 INR was previously in goal on current home regimen  Give warfarin 2.5 mg PO x 1 (home dose) Monitor INR daily  Alice Innocent, PharmD, BCPS 03/02/2024,6:46 PM

## 2024-03-02 NOTE — Progress Notes (Signed)
 Tolerating diet.  Passing gas.  Positive BM. Discussed with Dr. Cherilyn Corn this morning.  Vital signs in last 24 hours: Temp:  [99.5 F (37.5 C)-99.6 F (37.6 C)] 99.5 F (37.5 C) (05/18 0422) Pulse Rate:  [108-109] 108 (05/18 0422) Resp:  [20] 20 (05/18 0422) BP: (118-148)/(67-111) 118/67 (05/18 0422) SpO2:  [96 %-97 %] 96 % (05/18 0422) Last BM Date : 03/01/24 General:   Alert,  Well-developed, well-nourished, pleasant and cooperative in NAD Abdomen: Nondistended.  Positive bowel sounds soft and nontender extremities:  Without clubbing or edema.    Intake/Output from previous day: 05/17 0701 - 05/18 0700 In: 1879 [P.O.:1080; I.V.:486.2; IV Piggyback:312.9] Out: -  Intake/Output this shift: Total I/O In: 240 [P.O.:240] Out: -   Lab Results: Recent Labs    02/29/24 0449 03/02/24 0513  WBC 10.8* 10.0  HGB 14.2 13.7  HCT 41.0 38.9*  PLT 181 169   BMET Recent Labs    02/29/24 0449 03/01/24 0421 03/02/24 0513  NA 136 135 133*  K 3.8 3.4* 3.5  CL 103 100 102  CO2 21* 21* 23  GLUCOSE 85 84 106*  BUN 19 18 17   CREATININE 1.24 1.28* 1.21  CALCIUM 8.5* 8.7* 8.3*   LFT No results for input(s): "PROT", "ALBUMIN", "AST", "ALT", "ALKPHOS", "BILITOT", "BILIDIR", "IBILI" in the last 72 hours. PT/INR Recent Labs    03/01/24 0421 03/02/24 0513  LABPROT 25.6* 22.2*  INR 2.3* 1.9*   Hepatitis Panel No results for input(s): "HEPBSAG", "HCVAB", "HEPAIGM", "HEPBIGM" in the last 72 hours. C-Diff Recent Labs    02/28/24 1245  CDIFFTOX NEGATIVE    Studies/Results: DG Abd Portable 1V-Small Bowel Obstruction Protocol-initial, 8 hr delay Result Date: 02/29/2024 CLINICAL DATA:  8 hour follow-up EXAM: PORTABLE ABDOMEN - 1 VIEW COMPARISON:  02/29/2024, 11:43 a.m. FINDINGS: The bowel gas pattern is normal. No radio-opaque calculi or other significant radiographic abnormality are seen. Oral contrast in colon and rectosigmoid. IMPRESSION: Oral contrast in colon and rectosigmoid.   No bowel dilatation. Electronically Signed   By: Sydell Eva M.D.   On: 02/29/2024 22:33   DG Abd Portable 1V-Small Bowel Protocol-Position Verification Result Date: 02/29/2024 CLINICAL DATA:  NG placement. EXAM: PORTABLE ABDOMEN - 1 VIEW COMPARISON:  CT abdomen pelvis dated 02/27/2024. FINDINGS: Partially visualized enteric tube with tip and side port in the upper abdomen, likely in the proximal stomach. Mildly dilated small bowel loops measure up to 3 cm diameter. IMPRESSION: Enteric tube with tip and side port in the proximal stomach. Electronically Signed   By: Angus Bark M.D.   On: 02/29/2024 14:41    Impression:-89 year-old gentleman with known fiber stenosing Crohn's disease admitted with acute bowel obstruction secondary to same.  May have acute inflammatory component precipitating obstruction.  Clinically, much improved with NG decompression and advancement of diet.   Recommendations:   - Low residue diet  - Keep outpatient appointment tomorrow at 2 PM for Entyvio  infusion  - Follow-up with Dr. Sammi Crick in the coming weeks.  Consider ileocolonoscopy to reassess disease activity as deemed appropriate by Dr. Sammi Crick.  - History of carcinoid tumor; patient urged to keep appointment for upcoming PET.  - Agree he can be discharged later today per hospitalist.

## 2024-03-02 NOTE — Plan of Care (Signed)

## 2024-03-02 NOTE — Progress Notes (Addendum)
 PROGRESS NOTE  DEAKEN JURGENS ZOX:[id number] DOB: 07-30-1931 DOA: 02/27/2024 PCP: Orest Bio, MD  Brief History:  89 year old male with a history of permanent atrial fibrillation on warfarin, nonobstructive CAD, chronic disease with chronic stricture, carcinoid tumor, GERD, hypothyroidism, and B12 deficiency presenting with nausea, abdominal pain, and diarrhea.  The patient had been in his usual state of health before developing nausea and diarrhea on 02/26/2024.  He had 5 loose bowel movements on 02/25/2024 without any blood.  After he took Pepto-Bismol x 2, he noted black stools.  He had 1 bowel movement that was loose prior to coming to the emergency room in the early morning 02/27/2024.  He is passing flatus presently.  He denies any new medications.  He denies any fevers but has some subjective chills.  He denies any headache, neck pain, chest pain, shortness of breath, coughing, hemoptysis.  There is no hematochezia.  There is no dysuria or hematuria. Notably, the patient had a recent EGD performed on 02/15/2024 which showed a 2 cm hiatal hernia, and a submucosal nodule in the duodenum NET.  Since then, he has restarted his warfarin.  His last warfarin dose was on the evening of 02/26/2024.  Because of worsening abdominal pain and distention and loose stool, he presented for further evaluation and treatment. In the ED, the patient was afebrile hemodynamically stable with oxygen  saturation 97% room air.  WBC 8.3, hemoglobin 13.2, platelets 159.  Sodium 139, potassium 3.8, bicarbonate 22, serum creatinine 1.50.  General surgery was consulted.  They recommended NG tube and holding warfarin for now.  The patient gradually improved with NG decompression.  SBFT confirmed improving SBO.  NG was removed and diet was advanced which patient tolerated.  His hospitalization was prolonged due to afib with RVR.    Assessment/Plan: Partial small bowel obstruction - Patient is passing flatus - He had 1  loose bowel movement prior to coming to the emergency department in the early a.m. 02/27/2024 -02/27/2024 CT AP--diffusely dilated small bowel up to 4 cm down to the distal ileal surgical anastomosis; diverticulosis; chronic bladder wall thickening; mild focal thickening and adjacent edema in the right upper quadrant - General Surgery consult appreciated - discussed with Dr. Randi Buster - 02/28/24--had one BM in afternoon -02/29/24--2 BMs -03/01/24--had one BM; NG removed, started on clears - Optimize electrolytes - Changed medications to IV if possible intially - appreciate GI consult - continue IVF   Carcinoid tumor - Patient is scheduled for PET scan in near future - Follow-up with Dr. Cheree Cords   Permanent atrial fibrillation with RVR - am 5/17--pt developed mild RVR - start po dilitiazem 60 po q 6 hrs  -if remains in RVR--will transfer to SDU - continue IV metoprolol >>transition to po - transition to long acting diltiazem  - restart warfarin -INR 2.8>>2.3>>2.6>>2.3>>1.9 -personally reviewed EKG--afib, nonspecific TWI   Hypophosphatemia/Hypokalemia -replete   Essential HTN -continue lopressor  to 5 mg IV q 6 hrs for now until reliably taking po   CKD 3b -baseline creatinine 1.2-1.5   Crohn's disease - The patient's next dose of Entyvio  is due on 03/03/2024 - Follow-up Dr. Sammi Crick   Nonobstructive CAD - No chest pain presently   Hypothyroidism - Plan to start IV Synthroid  if remains n.p.o. for prolonged period of time   GERD - Continue IV pantoprazole            Family Communication:   spouse at bedside 5/18   Consultants:  GI,  gen surgery Code Status:  FULL    DVT Prophylaxis:  warfarin     Procedures: As Listed in Progress Note Above   Antibiotics: None          Subjective: Patient denies fevers, chills, headache, chest pain, dyspnea, nausea, vomiting, diarrhea, abdominal pain, dysuria, hematuria, hematochezia, and melena. Having BMs and  flatus.  Tolerating diet  Objective: Vitals:   03/01/24 1217 03/02/24 0422 03/02/24 1111 03/02/24 1500  BP: (!) 148/111 118/67  134/81  Pulse: (!) 109 (!) 108  (!) 105  Resp:  20 20   Temp: 99.6 F (37.6 C) 99.5 F (37.5 C)  97.9 F (36.6 C)  TempSrc: Oral Oral  Oral  SpO2: 97% 96%  98%  Weight:      Height:        Intake/Output Summary (Last 24 hours) at 03/02/2024 1837 Last data filed at 03/02/2024 1700 Gross per 24 hour  Intake 720 ml  Output --  Net 720 ml   Weight change:  Exam:  General:  Pt is alert, follows commands appropriately, not in acute distress HEENT: No icterus, No thrush, No neck mass, Williams/AT Cardiovascular: IRRR, S1/S2, no rubs, no gallops Respiratory: CTA bilaterally, no wheezing, no crackles, no rhonchi Abdomen: Soft/+BS, non tender, non distended, no guarding Extremities: No edema, No lymphangitis, No petechiae, No rashes, no synovitis   Data Reviewed: I have personally reviewed following labs and imaging studies Basic Metabolic Panel: Recent Labs  Lab 02/27/24 0537 02/27/24 0555 02/28/24 0457 02/29/24 0449 03/01/24 0421 03/02/24 0513  NA 138 139 139 136 135 133*  K 3.9 3.8 4.0 3.8 3.4* 3.5  CL 106 104 105 103 100 102  CO2 24  --  21* 21* 21* 23  GLUCOSE 122* 120* 82 85 84 106*  BUN 19 20 16 19 18 17   CREATININE 1.57* 1.50* 1.53* 1.24 1.28* 1.21  CALCIUM 9.0  --  8.8* 8.5* 8.7* 8.3*  MG  --   --  1.7 1.7 2.0 1.5*  PHOS  --   --  3.3 2.4* 2.3* 2.6   Liver Function Tests: Recent Labs  Lab 02/25/24 1416 02/27/24 0537 02/28/24 0457  AST 20 20 18   ALT 16 15 15   ALKPHOS 59 49 60  BILITOT 0.6 1.0 1.7*  PROT 7.0 6.0* 6.3*  ALBUMIN 4.0 3.5 3.4*   Recent Labs  Lab 02/27/24 0537  LIPASE 38   No results for input(s): "AMMONIA" in the last 168 hours. Coagulation Profile: Recent Labs  Lab 02/27/24 0708 02/28/24 0457 02/29/24 0449 03/01/24 0421 03/02/24 0513  INR 2.8* 2.2* 2.6* 2.3* 1.9*   CBC: Recent Labs  Lab  02/27/24 0537 02/27/24 0555 02/28/24 0457 02/29/24 0449 03/02/24 0513  WBC 8.3  --  10.9* 10.8* 10.0  NEUTROABS 6.0  --   --   --   --   HGB 13.2 12.6* 14.0 14.2 13.7  HCT 37.5* 37.0* 41.4 41.0 38.9*  MCV 89.3  --  91.0 89.1 88.8  PLT 159  --  193 181 169   Cardiac Enzymes: No results for input(s): "CKTOTAL", "CKMB", "CKMBINDEX", "TROPONINI" in the last 168 hours. BNP: Invalid input(s): "POCBNP" CBG: No results for input(s): "GLUCAP" in the last 168 hours. HbA1C: No results for input(s): "HGBA1C" in the last 72 hours. Urine analysis:    Component Value Date/Time   COLORURINE YELLOW 12/06/2023 1236   APPEARANCEUR CLEAR 12/06/2023 1236   APPEARANCEUR Clear 03/16/2021 1450   LABSPEC 1.015 12/06/2023 1236   PHURINE  6.0 12/06/2023 1236   GLUCOSEU NEGATIVE 12/06/2023 1236   HGBUR SMALL (A) 12/06/2023 1236   BILIRUBINUR NEGATIVE 12/06/2023 1236   BILIRUBINUR Negative 03/16/2021 1450   KETONESUR NEGATIVE 12/06/2023 1236   PROTEINUR NEGATIVE 12/06/2023 1236   UROBILINOGEN negative (A) 03/23/2020 1506   NITRITE NEGATIVE 12/06/2023 1236   LEUKOCYTESUR NEGATIVE 12/06/2023 1236   Sepsis Labs: @LABRCNTIP (procalcitonin:4,lacticidven:4) ) Recent Results (from the past 240 hours)  Gastrointestinal Panel by PCR , Stool     Status: None   Collection Time: 02/28/24 12:45 PM   Specimen: Stool  Result Value Ref Range Status   Campylobacter species NOT DETECTED NOT DETECTED Final   Plesimonas shigelloides NOT DETECTED NOT DETECTED Final   Salmonella species NOT DETECTED NOT DETECTED Final   Yersinia enterocolitica NOT DETECTED NOT DETECTED Final   Vibrio species NOT DETECTED NOT DETECTED Final   Vibrio cholerae NOT DETECTED NOT DETECTED Final   Enteroaggregative E coli (EAEC) NOT DETECTED NOT DETECTED Final   Enteropathogenic E coli (EPEC) NOT DETECTED NOT DETECTED Final   Enterotoxigenic E coli (ETEC) NOT DETECTED NOT DETECTED Final   Shiga like toxin producing E coli (STEC) NOT  DETECTED NOT DETECTED Final   Shigella/Enteroinvasive E coli (EIEC) NOT DETECTED NOT DETECTED Final   Cryptosporidium NOT DETECTED NOT DETECTED Final   Cyclospora cayetanensis NOT DETECTED NOT DETECTED Final   Entamoeba histolytica NOT DETECTED NOT DETECTED Final   Giardia lamblia NOT DETECTED NOT DETECTED Final   Adenovirus F40/41 NOT DETECTED NOT DETECTED Final   Astrovirus NOT DETECTED NOT DETECTED Final   Norovirus GI/GII NOT DETECTED NOT DETECTED Final   Rotavirus A NOT DETECTED NOT DETECTED Final   Sapovirus (I, II, IV, and V) NOT DETECTED NOT DETECTED Final    Comment: Performed at [facility], [address] [place], [place] [address]  C Difficile Quick Screen w PCR reflex     Status: None   Collection Time: 02/28/24 12:45 PM   Specimen: STOOL  Result Value Ref Range Status   C Diff antigen NEGATIVE NEGATIVE Final   C Diff toxin NEGATIVE NEGATIVE Final   C Diff interpretation No C. difficile detected.  Final    Comment: Performed at [facility], [address] [place], [place] [address]     Scheduled Meds:  benzonatate  100 mg Oral TID   bisacodyl   10 mg Rectal Daily   diltiazem   360 mg Oral Daily   docusate sodium   100 mg Oral Daily   metoprolol  tartrate  25 mg Oral Q6H   pantoprazole   40 mg Oral BID   polyethylene glycol  17 g Oral Daily   potassium chloride   40 mEq Oral Once   Continuous Infusions:  magnesium  sulfate bolus IVPB      Procedures/Studies: DG Abd Portable 1V-Small Bowel Obstruction Protocol-initial, 8 hr delay Result Date: 02/29/2024 CLINICAL DATA:  8 hour follow-up EXAM: PORTABLE ABDOMEN - 1 VIEW COMPARISON:  02/29/2024, 11:43 a.m. FINDINGS: The bowel gas pattern is normal. No radio-opaque calculi or other significant radiographic abnormality are seen. Oral contrast in colon and rectosigmoid. IMPRESSION: Oral contrast in colon and rectosigmoid.  No bowel dilatation. Electronically Signed   By: Sydell Eva M.D.   On: 02/29/2024 22:33    DG Abd Portable 1V-Small Bowel Protocol-Position Verification Result Date: 02/29/2024 CLINICAL DATA:  NG placement. EXAM: PORTABLE ABDOMEN - 1 VIEW COMPARISON:  CT abdomen pelvis dated 02/27/2024. FINDINGS: Partially visualized enteric tube with tip and side port in the upper abdomen, likely in  the proximal stomach. Mildly dilated small bowel loops measure up to 3 cm diameter. IMPRESSION: Enteric tube with tip and side port in the proximal stomach. Electronically Signed   By: Angus Bark M.D.   On: 02/29/2024 14:41   DG Chest Portable 1 View Result Date: 02/27/2024 EXAM: 1 VIEW XRAY OF THE CHEST 02/27/2024 08:02:00 AM COMPARISON: 12/06/2023 chest radiograph. CLINICAL HISTORY: NG placement. Pt states he had multiple episodes of diarrhea yesterday and today; pt states he feels very weak and is unable to walk; Pt c/o nausea but no vomiting; Pt had 2 doses of pepto bismol yesterday. Hx of afib, CAD, pneumonia. Non smoker. FINDINGS: LUNGS AND PLEURA: No consolidation. No pulmonary edema. No pleural effusion. No pneumothorax. HEART AND MEDIASTINUM: No acute abnormality of the cardiac and mediastinal silhouettes. BONES AND SOFT TISSUES: No acute osseous abnormality. LINES AND TUBES: Enteric tube tip just below the esophagogastric junction with side port in the lower thoracic esophagus. Recommended advancing 8 cm. SOFT TISSUES: Surgical clips in the right upper quadrant of the abdomen. IMPRESSION: 1. Enteric tube tip just below the esophagogastric junction with side port in the lower thoracic esophagus. Recommend advancing 8 cm. 2. No active cardiopulmonary disease. Electronically signed by: Karlyn Overman MD 02/27/2024 08:20 AM EDT RP Workstation: [id number]   CT ABDOMEN PELVIS W CONTRAST Result Date: 02/27/2024 CLINICAL DATA:  Abdominal pain, diarrhea, and weakness. EXAM: CT ABDOMEN AND PELVIS WITH CONTRAST TECHNIQUE: Multidetector CT imaging of the abdomen and pelvis was performed using the standard protocol  following bolus administration of intravenous contrast. RADIATION DOSE REDUCTION: This exam was performed according to the departmental dose-optimization program which includes automated exposure control, adjustment of the mA and/or kV according to patient size and/or use of iterative reconstruction technique. CONTRAST:  OMNIPAQUE  IOHEXOL  300 MG/ML  SOLN COMPARISON:  Numerous prior CTs back to 2009. The 2 most recent are both with contrast and dated 12/06/2023 and 12/22/2022. FINDINGS: Lower chest: There is scarring in the medial right lung base, linear atelectasis in the posterior left lower lobe. Lung bases are clear of infiltrates. Hepatobiliary: Chronic 8 mm too small to characterize hypodensity is unchanged in hepatic segment 6, probable small cyst. Remainder of liver is unremarkable. Gallbladder is absent without bile duct dilatation. Pancreas: No abnormality. Spleen: Stable 2.4 cm splenic cyst.  Otherwise unremarkable spleen. Adrenals/Urinary Tract: No adrenal mass. There is bilateral renal cortical thinning, scattered scarring on the left. There are few small bilateral renal cysts requiring no follow-up imaging. No mass enhancement is seen. There is no urinary stone or obstruction. There is mild chronic bladder thickening and trabeculation, small scattered diverticula. Findings likely due to chronic bladder outlet obstruction. No bladder mass is seen. Stomach/Bowel: Small hiatal hernia. Unremarkable contracted stomach. Beginning at the ligament of Treitz, the small bowel is diffusely dilated up to 4 cm down to a distal ileal surgical anastomotic staple line in the anterior right upper to mid abdomen, past which the the more distal terminal ileum is collapsed. I do not see further areas of transition. Some of the right lower abdominal segments demonstrate mild fold thickening and adjacent edema but do not show pneumatosis. There are stones within a normal caliber appendix. The colon wall is normal in  thickness. There is left-sided diverticulosis with no evidence of diverticulitis. Vascular/Lymphatic: Moderate aortoiliac calcific plaques. Infrarenal aortic ectasia again measures 2.5 cm. No other significant vascular findings.  No adenopathy. Reproductive: Slightly prominent prostate with TURP defect. Other: Bilateral inguinal fat hernias. Trace ascites in  the right abdominal mesenteric folds. No free air. Musculoskeletal: Osteopenia with degenerative changes of the spine. Bridging osteophytes left SI joint. Bilateral hip DJD. No acute or other significant osseous findings. IMPRESSION: 1. Diffusely dilated small bowel up to 4 cm down to a distal ileal surgical anastomotic staple line in the anterior right upper to mid abdomen, past which the more distal terminal ileum is collapsed. Findings are consistent with small bowel obstruction. 2. Some of the right lower abdominal segments demonstrate mild fold thickening and adjacent edema but do not show pneumatosis. Early ischemia not excluded. 3. Trace ascites in the right abdominal mesenteric folds. No free air. 4. Diverticulosis without evidence of diverticulitis. 5. Aortic and coronary artery atherosclerosis. 6. Chronic bladder thickening and trabeculation with small diverticula, likely due to chronic bladder outlet obstruction. Query underlying cystitis. 7. Small hiatal hernia.  Bilateral inguinal fat hernias. 8. Osteopenia and degenerative change. Aortic Atherosclerosis (ICD10-I70.0). Electronically Signed   By: Denman Fischer M.D.   On: 02/27/2024 06:42    Demaris Fillers, DO  [facility]  If 7PM-7AM, please contact night-coverage [email or web address] Password [facility] 03/02/2024, 6:37 PM   LOS: 4 days

## 2024-03-03 ENCOUNTER — Telehealth: Payer: Self-pay | Admitting: Cardiology

## 2024-03-03 ENCOUNTER — Encounter: Admitting: Emergency Medicine

## 2024-03-03 ENCOUNTER — Telehealth: Payer: Self-pay | Admitting: Gastroenterology

## 2024-03-03 ENCOUNTER — Ambulatory Visit

## 2024-03-03 VITALS — BP 123/58 | HR 61 | Temp 97.6°F | Resp 18

## 2024-03-03 DIAGNOSIS — I4821 Permanent atrial fibrillation: Secondary | ICD-10-CM | POA: Diagnosis not present

## 2024-03-03 DIAGNOSIS — K56609 Unspecified intestinal obstruction, unspecified as to partial versus complete obstruction: Secondary | ICD-10-CM | POA: Diagnosis not present

## 2024-03-03 DIAGNOSIS — K56699 Other intestinal obstruction unspecified as to partial versus complete obstruction: Secondary | ICD-10-CM | POA: Diagnosis not present

## 2024-03-03 DIAGNOSIS — D696 Thrombocytopenia, unspecified: Secondary | ICD-10-CM | POA: Diagnosis not present

## 2024-03-03 DIAGNOSIS — K50018 Crohn's disease of small intestine with other complication: Secondary | ICD-10-CM | POA: Diagnosis not present

## 2024-03-03 LAB — CBC
HCT: 40.7 % (ref 39.0–52.0)
Hemoglobin: 13.4 g/dL (ref 13.0–17.0)
MCH: 29.8 pg (ref 26.0–34.0)
MCHC: 32.9 g/dL (ref 30.0–36.0)
MCV: 90.6 fL (ref 80.0–100.0)
Platelets: 212 10*3/uL (ref 150–400)
RBC: 4.49 MIL/uL (ref 4.22–5.81)
RDW: 13.4 % (ref 11.5–15.5)
WBC: 11.4 10*3/uL — ABNORMAL HIGH (ref 4.0–10.5)
nRBC: 0 % (ref 0.0–0.2)

## 2024-03-03 LAB — MAGNESIUM: Magnesium: 1.9 mg/dL (ref 1.7–2.4)

## 2024-03-03 LAB — BASIC METABOLIC PANEL WITH GFR
Anion gap: 6 (ref 5–15)
BUN: 18 mg/dL (ref 8–23)
CO2: 24 mmol/L (ref 22–32)
Calcium: 8.6 mg/dL — ABNORMAL LOW (ref 8.9–10.3)
Chloride: 102 mmol/L (ref 98–111)
Creatinine, Ser: 1.33 mg/dL — ABNORMAL HIGH (ref 0.61–1.24)
GFR, Estimated: 51 mL/min — ABNORMAL LOW (ref >60)
Glucose, Bld: 107 mg/dL — ABNORMAL HIGH (ref 70–99)
Potassium: 4 mmol/L (ref 3.5–5.1)
Sodium: 132 mmol/L — ABNORMAL LOW (ref 135–145)

## 2024-03-03 LAB — PROTIME-INR
INR: 1.3 — ABNORMAL HIGH (ref 0.8–1.2)
Prothrombin Time: 16.6 s — ABNORMAL HIGH (ref 11.4–15.2)

## 2024-03-03 MED ORDER — METOPROLOL SUCCINATE ER 25 MG PO TB24
50.0000 mg | ORAL_TABLET | Freq: Every day | ORAL | Status: DC
  Administered 2024-03-03: 50 mg via ORAL
  Filled 2024-03-03: qty 2

## 2024-03-03 MED ORDER — WARFARIN SODIUM 2 MG PO TABS: 4.0000 mg | ORAL_TABLET | Freq: Once | ORAL | Status: DC

## 2024-03-03 MED ORDER — VEDOLIZUMAB 300 MG IV SOLR
300.0000 mg | Freq: Once | INTRAVENOUS | Status: AC
  Administered 2024-03-03: 300 mg via INTRAVENOUS
  Filled 2024-03-03: qty 5

## 2024-03-03 MED ORDER — METOPROLOL SUCCINATE ER 50 MG PO TB24: 50.0000 mg | ORAL_TABLET | Freq: Every day | ORAL | 1 refills | Status: DC

## 2024-03-03 MED ORDER — MAGNESIUM SULFATE 2 GM/50ML IV SOLN
2.0000 g | Freq: Once | INTRAVENOUS | Status: DC
  Administered 2024-03-03: 2 g via INTRAVENOUS
  Filled 2024-03-03: qty 50

## 2024-03-03 MED ORDER — DILTIAZEM HCL ER COATED BEADS 360 MG PO CP24: 360.0000 mg | ORAL_CAPSULE | Freq: Every day | ORAL | 1 refills | Status: DC

## 2024-03-03 NOTE — Care Management Important Message (Signed)
 Important Message  Patient Details  Name: [name] MRN: [id number] Date of Birth: 26-Jun-1931   Important Message Given:  Yes - Medicare IM     Miriam Liles L Kyriana Yankee 03/03/2024, 11:07 AM

## 2024-03-03 NOTE — Telephone Encounter (Signed)
 Pt c/o medication issue:  1. Name of Medication:     diltiazem  (CARDIZEM  CD) 360 MG 24 hr capsule  carvedilol  (COREG ) tablet 12.5 mg    metoprolol  succinate (TOPROL -XL) 50 MG 24 hr tablet   2. How are you currently taking this medication (dosage and times per day)?   3. Are you having a reaction (difficulty breathing--STAT)?   4. What is your medication issue?   Wife Ninette Basque) stated patient has been in hospital and they have stopped the patient's Carvedilol , increased patient's Diltiazem  and Metoprolol .  Wife wants a call back to discuss these medication changes.

## 2024-03-03 NOTE — Discharge Summary (Addendum)
 Physician Discharge Summary   Patient: [name] MRN: [id number] DOB: 1931/03/16  Admit date:     02/27/2024  Discharge date: 03/03/24  Discharge Physician: Myrtie Atkinson Sencere Symonette   PCP: Orest Bio, MD   Recommendations at discharge:   Please follow up with primary care provider within 1-2 weeks  Please repeat BMP and CBC in one week Please report to GI clinic this afternoon for Entyvio  infusion Please go to warfarin clinic on 5/21 or 5/22 for INR check     Hospital Course: 89 year old male with a history of permanent atrial fibrillation on warfarin, nonobstructive CAD, chronic disease with chronic stricture, carcinoid tumor, GERD, hypothyroidism, and B12 deficiency presenting with nausea, abdominal pain, and diarrhea.  The patient had been in his usual state of health before developing nausea and diarrhea on 02/26/2024.  He had 5 loose bowel movements on 02/25/2024 without any blood.  After he took Pepto-Bismol x 2, he noted black stools.  He had 1 bowel movement that was loose prior to coming to the emergency room in the early morning 02/27/2024.  He is passing flatus presently.  He denies any new medications.  He denies any fevers but has some subjective chills.  He denies any headache, neck pain, chest pain, shortness of breath, coughing, hemoptysis.  There is no hematochezia.  There is no dysuria or hematuria. Notably, the patient had a recent EGD performed on 02/15/2024 which showed a 2 cm hiatal hernia, and a submucosal nodule in the duodenum NET.  Since then, he has restarted his warfarin.  His last warfarin dose was on the evening of 02/26/2024.  Because of worsening abdominal pain and distention and loose stool, he presented for further evaluation and treatment. In the ED, the patient was afebrile hemodynamically stable with oxygen  saturation 97% room air.  WBC 8.3, hemoglobin 13.2, platelets 159.  Sodium 139, potassium 3.8, bicarbonate 22, serum creatinine 1.50.  General surgery was consulted.   They recommended NG tube and holding warfarin for now.  The patient gradually improved with NG decompression.  SBFT confirmed improving SBO.  NG was removed and diet was advanced which patient tolerated.  His hospitalization was prolonged due to afib with RVR.  With adjustment of his metoprolol  and diltiazem , HR became controlled.   Assessment and Plan: Partial small bowel obstruction - Patient is passing flatus and having BMs - He had 1 loose bowel movement prior to coming to the emergency department in the early a.m. 02/27/2024 -02/27/2024 CT AP--diffusely dilated small bowel up to 4 cm down to the distal ileal surgical anastomosis; diverticulosis; chronic bladder wall thickening; mild focal thickening and adjacent edema in the right upper quadrant - General Surgery consult appreciated - discussed with Dr. Randi Buster - 02/28/24--had one BM in afternoon -02/29/24--2 BMs -03/01/24--had one BM; NG removed, started on clears - Optimize electrolytes - Changed medications to IV if possible intially - appreciate GI consult>>next Entyvio  infusion 5/19 - tolerating diet x 36-48 hours prior to dc   Carcinoid tumor - Patient is scheduled for PET scan in near future - Follow-up with Dr. Cheree Cords   Permanent atrial fibrillation with RVR - am 5/17--pt developed mild RVR - start po dilitiazem 60 po q 6 hrs  -if remains in RVR--will transfer to SDU - continue IV metoprolol >>transition to po - transition to long acting diltiazem  - restarted warfarin 5/18 -INR 2.8>>2.3>>2.6>>2.3>>1.9 -personally reviewed EKG--afib, nonspecific TWI   Hypophosphatemia/Hypokalemia -repleted   Essential HTN -continue lopressor  to 5 mg IV q 6 hrs for  now until reliably taking po   CKD 3b -baseline creatinine 1.2-1.5 -serum creatinine 1.33 on day of dc   Crohn's disease - The patient's next dose of Entyvio  is due on 03/03/2024 - Follow-up Dr. Sammi Crick   Nonobstructive CAD - No chest pain presently    Hypothyroidism - Plan to start IV Synthroid  if remains n.p.o. for prolonged period of time   GERD - Continued IV pantoprazole  during hospitalization        Consultants: GI, general surgery Procedures performed: none  Disposition: Home Diet recommendation:  Cardiac diet DISCHARGE MEDICATION: Allergies as of 03/03/2024   No Known Allergies      Medication List     STOP taking these medications    carvedilol  12.5 MG tablet Commonly known as: COREG        TAKE these medications    acetaminophen  500 MG tablet Commonly known as: TYLENOL  Take 1,000 mg by mouth every 6 (six) hours as needed for mild pain (pain score 1-3) or moderate pain (pain score 4-6).   Align 4 MG Caps Take 4 mg by mouth in the morning.   CALCIUM  PO Take 300 mg by mouth in the morning and at bedtime.   cyanocobalamin  1000 MCG tablet Commonly known as: VITAMIN B12 Take 1,000 mcg by mouth daily.   diltiazem  360 MG 24 hr capsule Commonly known as: CARDIZEM  CD Take 1 capsule (360 mg total) by mouth daily. What changed:  medication strength how much to take when to take this   ENTYVIO  IV Inject into the vein. Every 8 weeks.   levothyroxine  75 MCG tablet Commonly known as: SYNTHROID  Take 75 mcg by mouth daily before breakfast.   magnesium  oxide 400 (240 Mg) MG tablet Commonly known as: MAG-OX Take 400 mg by mouth 2 (two) times daily.   Melatonin 10 MG Tabs Take 10 mg by mouth at bedtime.   metoprolol  succinate 50 MG 24 hr tablet Commonly known as: TOPROL -XL Take 1 tablet (50 mg total) by mouth daily.   nitroGLYCERIN  0.4 MG SL tablet Commonly known as: NITROSTAT  Place 0.4 mg under the tongue every 5 (five) minutes as needed. For chest pains. May repeat for up to 3 doses.   omeprazole  40 MG capsule Commonly known as: PRILOSEC TAKE ONE CAPSULE BY MOUTH EVERY DAY   traZODone  50 MG tablet Commonly known as: DESYREL  Take 50 mg by mouth at bedtime.   Vitamin D3 50 MCG (2000 UT)  Tabs Take 2,000 Units by mouth daily.   warfarin 2.5 MG tablet Commonly known as: COUMADIN  Take as directed. If you are unsure how to take this medication, talk to your nurse or doctor. Original instructions: TAKE 1 TABLET TO 1 1/2 TABLETS BY MOUTH DAILY OR AS DIRECTED BY COUMADIN  CLINIC What changed:  how much to take how to take this when to take this        Discharge Exam: Filed Weights   02/27/24 0523  Weight: 72.7 kg   HEENT:  [place]/AT, No thrush, no icterus CV:  RRR, no rub, no S3, no S4 Lung:  CTA, no wheeze, no rhonchi Abd:  soft/+BS, NT Ext:  No edema, no lymphangitis, no synovitis, no rash   Condition at discharge: stable  The results of significant diagnostics from this hospitalization (including imaging, microbiology, ancillary and laboratory) are listed below for reference.   Imaging Studies: DG Abd Portable 1V-Small Bowel Obstruction Protocol-initial, 8 hr delay Result Date: 02/29/2024 CLINICAL DATA:  8 hour follow-up EXAM: PORTABLE ABDOMEN - 1 VIEW  COMPARISON:  02/29/2024, 11:43 a.m. FINDINGS: The bowel gas pattern is normal. No radio-opaque calculi or other significant radiographic abnormality are seen. Oral contrast in colon and rectosigmoid. IMPRESSION: Oral contrast in colon and rectosigmoid.  No bowel dilatation. Electronically Signed   By: Sydell Eva M.D.   On: 02/29/2024 22:33   DG Abd Portable 1V-Small Bowel Protocol-Position Verification Result Date: 02/29/2024 CLINICAL DATA:  NG placement. EXAM: PORTABLE ABDOMEN - 1 VIEW COMPARISON:  CT abdomen pelvis dated 02/27/2024. FINDINGS: Partially visualized enteric tube with tip and side port in the upper abdomen, likely in the proximal stomach. Mildly dilated small bowel loops measure up to 3 cm diameter. IMPRESSION: Enteric tube with tip and side port in the proximal stomach. Electronically Signed   By: Angus Bark M.D.   On: 02/29/2024 14:41   DG Chest Portable 1 View Result Date: 02/27/2024 EXAM: 1  VIEW XRAY OF THE CHEST 02/27/2024 08:02:00 AM COMPARISON: 12/06/2023 chest radiograph. CLINICAL HISTORY: NG placement. Pt states he had multiple episodes of diarrhea yesterday and today; pt states he feels very weak and is unable to walk; Pt c/o nausea but no vomiting; Pt had 2 doses of pepto bismol yesterday. Hx of afib, CAD, pneumonia. Non smoker. FINDINGS: LUNGS AND PLEURA: No consolidation. No pulmonary edema. No pleural effusion. No pneumothorax. HEART AND MEDIASTINUM: No acute abnormality of the cardiac and mediastinal silhouettes. BONES AND SOFT TISSUES: No acute osseous abnormality. LINES AND TUBES: Enteric tube tip just below the esophagogastric junction with side port in the lower thoracic esophagus. Recommended advancing 8 cm. SOFT TISSUES: Surgical clips in the right upper quadrant of the abdomen. IMPRESSION: 1. Enteric tube tip just below the esophagogastric junction with side port in the lower thoracic esophagus. Recommend advancing 8 cm. 2. No active cardiopulmonary disease. Electronically signed by: Karlyn Overman MD 02/27/2024 08:20 AM EDT RP Workstation: [id number]   CT ABDOMEN PELVIS W CONTRAST Result Date: 02/27/2024 CLINICAL DATA:  Abdominal pain, diarrhea, and weakness. EXAM: CT ABDOMEN AND PELVIS WITH CONTRAST TECHNIQUE: Multidetector CT imaging of the abdomen and pelvis was performed using the standard protocol following bolus administration of intravenous contrast. RADIATION DOSE REDUCTION: This exam was performed according to the departmental dose-optimization program which includes automated exposure control, adjustment of the mA and/or kV according to patient size and/or use of iterative reconstruction technique. CONTRAST:  100mL OMNIPAQUE  IOHEXOL  300 MG/ML  SOLN COMPARISON:  Numerous prior CTs back to 2009. The 2 most recent are both with contrast and dated 12/06/2023 and 12/22/2022. FINDINGS: Lower chest: There is scarring in the medial right lung base, linear atelectasis in the  posterior left lower lobe. Lung bases are clear of infiltrates. Hepatobiliary: Chronic 8 mm too small to characterize hypodensity is unchanged in hepatic segment 6, probable small cyst. Remainder of liver is unremarkable. Gallbladder is absent without bile duct dilatation. Pancreas: No abnormality. Spleen: Stable 2.4 cm splenic cyst.  Otherwise unremarkable spleen. Adrenals/Urinary Tract: No adrenal mass. There is bilateral renal cortical thinning, scattered scarring on the left. There are few small bilateral renal cysts requiring no follow-up imaging. No mass enhancement is seen. There is no urinary stone or obstruction. There is mild chronic bladder thickening and trabeculation, small scattered diverticula. Findings likely due to chronic bladder outlet obstruction. No bladder mass is seen. Stomach/Bowel: Small hiatal hernia. Unremarkable contracted stomach. Beginning at the ligament of Treitz, the small bowel is diffusely dilated up to 4 cm down to a distal ileal surgical anastomotic staple line in the anterior right upper  to mid abdomen, past which the the more distal terminal ileum is collapsed. I do not see further areas of transition. Some of the right lower abdominal segments demonstrate mild fold thickening and adjacent edema but do not show pneumatosis. There are stones within a normal caliber appendix. The colon wall is normal in thickness. There is left-sided diverticulosis with no evidence of diverticulitis. Vascular/Lymphatic: Moderate aortoiliac calcific plaques. Infrarenal aortic ectasia again measures 2.5 cm. No other significant vascular findings.  No adenopathy. Reproductive: Slightly prominent prostate with TURP defect. Other: Bilateral inguinal fat hernias. Trace ascites in the right abdominal mesenteric folds. No free air. Musculoskeletal: Osteopenia with degenerative changes of the spine. Bridging osteophytes left SI joint. Bilateral hip DJD. No acute or other significant osseous findings.  IMPRESSION: 1. Diffusely dilated small bowel up to 4 cm down to a distal ileal surgical anastomotic staple line in the anterior right upper to mid abdomen, past which the more distal terminal ileum is collapsed. Findings are consistent with small bowel obstruction. 2. Some of the right lower abdominal segments demonstrate mild fold thickening and adjacent edema but do not show pneumatosis. Early ischemia not excluded. 3. Trace ascites in the right abdominal mesenteric folds. No free air. 4. Diverticulosis without evidence of diverticulitis. 5. Aortic and coronary artery atherosclerosis. 6. Chronic bladder thickening and trabeculation with small diverticula, likely due to chronic bladder outlet obstruction. Query underlying cystitis. 7. Small hiatal hernia.  Bilateral inguinal fat hernias. 8. Osteopenia and degenerative change. Aortic Atherosclerosis (ICD10-I70.0). Electronically Signed   By: Denman Fischer M.D.   On: 02/27/2024 06:42    Microbiology: Results for orders placed or performed during the hospital encounter of 02/27/24  Gastrointestinal Panel by PCR , Stool     Status: None   Collection Time: 02/28/24 12:45 PM   Specimen: Stool  Result Value Ref Range Status   Campylobacter species NOT DETECTED NOT DETECTED Final   Plesimonas shigelloides NOT DETECTED NOT DETECTED Final   Salmonella species NOT DETECTED NOT DETECTED Final   Yersinia enterocolitica NOT DETECTED NOT DETECTED Final   Vibrio species NOT DETECTED NOT DETECTED Final   Vibrio cholerae NOT DETECTED NOT DETECTED Final   Enteroaggregative E coli (EAEC) NOT DETECTED NOT DETECTED Final   Enteropathogenic E coli (EPEC) NOT DETECTED NOT DETECTED Final   Enterotoxigenic E coli (ETEC) NOT DETECTED NOT DETECTED Final   Shiga like toxin producing E coli (STEC) NOT DETECTED NOT DETECTED Final   Shigella/Enteroinvasive E coli (EIEC) NOT DETECTED NOT DETECTED Final   Cryptosporidium NOT DETECTED NOT DETECTED Final   Cyclospora  cayetanensis NOT DETECTED NOT DETECTED Final   Entamoeba histolytica NOT DETECTED NOT DETECTED Final   Giardia lamblia NOT DETECTED NOT DETECTED Final   Adenovirus F40/41 NOT DETECTED NOT DETECTED Final   Astrovirus NOT DETECTED NOT DETECTED Final   Norovirus GI/GII NOT DETECTED NOT DETECTED Final   Rotavirus A NOT DETECTED NOT DETECTED Final   Sapovirus (I, II, IV, and V) NOT DETECTED NOT DETECTED Final    Comment: Performed at [facility], [address] [place], [place] [address]  C Difficile Quick Screen w PCR reflex     Status: None   Collection Time: 02/28/24 12:45 PM   Specimen: STOOL  Result Value Ref Range Status   C Diff antigen NEGATIVE NEGATIVE Final   C Diff toxin NEGATIVE NEGATIVE Final   C Diff interpretation No C. difficile detected.  Final    Comment: Performed at [facility], [address],  [place], [place] [address]    Labs: CBC: Recent Labs  Lab 02/27/24 0537 02/27/24 0555 02/28/24 0457 02/29/24 0449 03/02/24 0513 03/03/24 0455  WBC 8.3  --  10.9* 10.8* 10.0 11.4*  NEUTROABS 6.0  --   --   --   --   --   HGB 13.2 12.6* 14.0 14.2 13.7 13.4  HCT 37.5* 37.0* 41.4 41.0 38.9* 40.7  MCV 89.3  --  91.0 89.1 88.8 90.6  PLT 159  --  193 181 169 212   Basic Metabolic Panel: Recent Labs  Lab 02/28/24 0457 02/29/24 0449 03/01/24 0421 03/02/24 0513 03/03/24 0455  NA 139 136 135 133* 132*  K 4.0 3.8 3.4* 3.5 4.0  CL 105 103 100 102 102  CO2 21* 21* 21* 23 24  GLUCOSE 82 85 84 106* 107*  BUN 16 19 18 17 18   CREATININE 1.53* 1.24 1.28* 1.21 1.33*  CALCIUM  8.8* 8.5* 8.7* 8.3* 8.6*  MG 1.7 1.7 2.0 1.5* 1.9  PHOS 3.3 2.4* 2.3* 2.6  --    Liver Function Tests: Recent Labs  Lab 02/25/24 1416 02/27/24 0537 02/28/24 0457  AST 20 20 18   ALT 16 15 15   ALKPHOS 59 49 60  BILITOT 0.6 1.0 1.7*  PROT 7.0 6.0* 6.3*  ALBUMIN 4.0 3.5 3.4*   CBG: No results for input(s): "GLUCAP" in the last 168 hours.  Discharge time spent: greater than 30  minutes.  Signed: Demaris Fillers, MD [facility] 03/03/2024

## 2024-03-03 NOTE — Progress Notes (Signed)
 Patient to be discharged home today. Entyvio  outpatient infusion today. Will request outpatient follow-up with Dr. Sammi Crick preferably. If no availability, [facility], or [facility], as he was seen inpatient by them as well.

## 2024-03-03 NOTE — Progress Notes (Signed)
 Diagnosis: Crohn's Disease  Provider:  Gayle Kava NP  Procedure: IV Infusion  IV Type: Peripheral, IV Location: L Antecubital  Entyvio  (Vedolizumab ), Dose: 300 mg  Infusion Start Time: 1404  Infusion Stop Time: 1442  Post Infusion IV Care: Peripheral IV Discontinued  Discharge: Condition: Good, Destination: Home . AVS Provided  Performed by:  Arlina Benjamin, RN

## 2024-03-03 NOTE — Telephone Encounter (Signed)
 If Home Health RN is calling please get Coumadin  Nurse on the phone STAT  1.  Are you calling in regards to an appointment? No  2.  Are you calling for a refill ? No  3.  Are you having bleeding issues? No  4.  Do you need clearance to hold Coumadin ? Has been off due to possible surg but did not have to have surg and wants to know when to restart. Requesting cb from [place]   Please route to the Coumadin  Clinic Pool

## 2024-03-03 NOTE — Progress Notes (Addendum)
 PHARMACY - ANTICOAGULATION CONSULT NOTE  Pharmacy Consult for Warfarin Indication: atrial fibrillation  No Known Allergies  Patient Measurements: Height: 5\' 8"  (172.7 cm) Weight: 72.7 kg (160 lb 4.4 oz) IBW/kg (Calculated) : 68.4 HEPARIN DW (KG): 72.7  Vital Signs: Temp: 98.8 F (37.1 C) (05/19 0905) Temp Source: Oral (05/19 0905) BP: 123/52 (05/19 0905) Pulse Rate: 63 (05/19 0905)  Labs: Recent Labs    03/01/24 0421 03/02/24 0513 03/03/24 0455  HGB  --  13.7 13.4  HCT  --  38.9* 40.7  PLT  --  169 212  LABPROT 25.6* 22.2* 16.6*  INR 2.3* 1.9* 1.3*  CREATININE 1.28* 1.21 1.33*    Estimated Creatinine Clearance: 35.7 mL/min (A) (by C-G formula based on SCr of 1.33 mg/dL (H)).   Medical History: Past Medical History:  Diagnosis Date   Anemia    Atrial fibrillation (HCC)    BPH (benign prostatic hyperplasia)    CAD (coronary artery disease)    Catheterization 2004, mild/moderate nonobstructive disease  /   nuclear, 2007, small inferior scar // no ischemia   Crohn's disease (HCC)    Elevated PSA    GERD (gastroesophageal reflux disease)    History of kidney stones    History of pneumonia    Hypothyroidism    Mitral regurgitation    Pneumonia    SBO (small bowel obstruction) (HCC)    Urinary retention    Wears glasses     Medications:  Warfarin 2.5 mg daily except 3.75 mg on Mondays   Assessment: Patient is a 89 year old who presented with nausea, abdominal pain, distension, and diarrhea that started 5/13. General surgery was consulted upon admission and recommended NG tube and patient's home warfarin for Afib due to partial SBO. Patient is now being restarted on home warfarin. Hgb 13.7. PLT 169.  Home warfarin dose: 2.5 mg daily except 3.75 mg on Mondays  Last dose: 5/13 @ 12pm INR goal: 2-3 Baseline INR: 1.9 (5/18)   Baseline INR slightly subtherapeutic, likely due to being held since 5/14  INR 1.9 >> 1.3  Goal of Therapy:  INR 2-3 Monitor  platelets by anticoagulation protocol: Yes   Plan:  Give warfarin 4 mg PO x 1  Monitor INR daily  Cliffton Dama, PharmD Clinical Pharmacist 03/03/2024 9:19 AM

## 2024-03-03 NOTE — Progress Notes (Signed)
 Discharge instructions provided to patient and wife. Daughter to transport. No concerns verbalized.

## 2024-03-03 NOTE — Telephone Encounter (Signed)
 Pt d/c from hospital today.  Coumadin  was stopped for possible surgery for small bowel obstruction but surgery was not needed.Told him to restart warfarin at previous home dose.  (It was restarted in hospital yesterday  INR was 1.3)  INR appt made doe 03/11/24.  He/wife verbalized understanding.

## 2024-03-03 NOTE — Telephone Encounter (Signed)
 [name]:  Patient needs appt with Dr. Sammi Crick preferably in the next 2-4 weeks. If he is unavailable, patient was also seen by Jearlean Mince and [facility] while inpatient. Thanks!

## 2024-03-04 NOTE — Telephone Encounter (Signed)
 Discharge summary and med changes reviewed.  Please arrange for cardiology f/u to re-evaluate Afib mgmt and medications.  It's possible that some of his medicines may be able to be consolidated.

## 2024-03-06 ENCOUNTER — Encounter (HOSPITAL_COMMUNITY)

## 2024-03-07 ENCOUNTER — Encounter (HOSPITAL_COMMUNITY): Admission: RE | Admit: 2024-03-07 | Source: Ambulatory Visit

## 2024-03-11 ENCOUNTER — Encounter: Admitting: *Deleted

## 2024-03-11 DIAGNOSIS — I4891 Unspecified atrial fibrillation: Secondary | ICD-10-CM

## 2024-03-11 DIAGNOSIS — Z5181 Encounter for therapeutic drug level monitoring: Secondary | ICD-10-CM

## 2024-03-11 LAB — POCT INR: INR: 1.9 — AB (ref 2.0–3.0)

## 2024-03-11 NOTE — Patient Instructions (Signed)
 S/P EGD on 5/2.  Was off warfarin 5 days Take warfarin 1 1/2 tablets tonight then resume 1 tablet daily except 1 1/2 tablets on Mondays Recheck 1 week

## 2024-03-12 ENCOUNTER — Other Ambulatory Visit: Payer: Self-pay

## 2024-03-12 DIAGNOSIS — E039 Hypothyroidism, unspecified: Secondary | ICD-10-CM | POA: Diagnosis not present

## 2024-03-12 DIAGNOSIS — E538 Deficiency of other specified B group vitamins: Secondary | ICD-10-CM | POA: Diagnosis not present

## 2024-03-12 DIAGNOSIS — D3A8 Other benign neuroendocrine tumors: Secondary | ICD-10-CM

## 2024-03-12 DIAGNOSIS — D72828 Other elevated white blood cell count: Secondary | ICD-10-CM | POA: Diagnosis not present

## 2024-03-12 DIAGNOSIS — K5090 Crohn's disease, unspecified, without complications: Secondary | ICD-10-CM | POA: Diagnosis not present

## 2024-03-12 DIAGNOSIS — D696 Thrombocytopenia, unspecified: Secondary | ICD-10-CM | POA: Diagnosis not present

## 2024-03-12 DIAGNOSIS — C7A010 Malignant carcinoid tumor of the duodenum: Secondary | ICD-10-CM | POA: Diagnosis not present

## 2024-03-12 DIAGNOSIS — I4891 Unspecified atrial fibrillation: Secondary | ICD-10-CM | POA: Diagnosis not present

## 2024-03-12 DIAGNOSIS — E871 Hypo-osmolality and hyponatremia: Secondary | ICD-10-CM | POA: Diagnosis not present

## 2024-03-12 DIAGNOSIS — Z79899 Other long term (current) drug therapy: Secondary | ICD-10-CM | POA: Diagnosis not present

## 2024-03-13 ENCOUNTER — Ambulatory Visit: Admitting: Hematology

## 2024-03-14 ENCOUNTER — Ambulatory Visit (HOSPITAL_COMMUNITY)

## 2024-03-14 DIAGNOSIS — M545 Low back pain, unspecified: Secondary | ICD-10-CM | POA: Diagnosis not present

## 2024-03-14 DIAGNOSIS — I10 Essential (primary) hypertension: Secondary | ICD-10-CM | POA: Diagnosis not present

## 2024-03-14 DIAGNOSIS — G4700 Insomnia, unspecified: Secondary | ICD-10-CM | POA: Diagnosis not present

## 2024-03-14 DIAGNOSIS — E782 Mixed hyperlipidemia: Secondary | ICD-10-CM | POA: Diagnosis not present

## 2024-03-14 DIAGNOSIS — E039 Hypothyroidism, unspecified: Secondary | ICD-10-CM | POA: Diagnosis not present

## 2024-03-16 LAB — 5 HIAA, QUANTITATIVE, URINE, 24 HOUR
5-HIAA, Ur: 2.1 mg/L
5-HIAA,Quant.,24 Hr Urine: 3.5 mg/(24.h) (ref 0.0–14.9)
Total Volume: 1650

## 2024-03-18 ENCOUNTER — Encounter (HOSPITAL_COMMUNITY)
Admission: RE | Admit: 2024-03-18 | Discharge: 2024-03-18 | Disposition: A | Discharge status: Home / Self Care | Source: Ambulatory Visit | Attending: Hematology | Admitting: Hematology

## 2024-03-18 DIAGNOSIS — D3A8 Other benign neuroendocrine tumors: Secondary | ICD-10-CM | POA: Insufficient documentation

## 2024-03-18 DIAGNOSIS — C7A8 Other malignant neuroendocrine tumors: Secondary | ICD-10-CM | POA: Diagnosis not present

## 2024-03-18 MED ORDER — COPPER CU 64 DOTATATE 1 MCI/ML IV SOLN
4.0000 | Freq: Once | INTRAVENOUS | Status: AC
  Administered 2024-03-18: 4 via INTRAVENOUS

## 2024-03-19 ENCOUNTER — Ambulatory Visit (INDEPENDENT_AMBULATORY_CARE_PROVIDER_SITE_OTHER): Admitting: *Deleted

## 2024-03-19 ENCOUNTER — Encounter: Payer: Self-pay | Admitting: Cardiology

## 2024-03-19 ENCOUNTER — Ambulatory Visit: Attending: Cardiology | Admitting: Cardiology

## 2024-03-19 VITALS — BP 122/70 | HR 70 | Ht 68.0 in | Wt 154.4 lb

## 2024-03-19 DIAGNOSIS — I4891 Unspecified atrial fibrillation: Secondary | ICD-10-CM | POA: Diagnosis not present

## 2024-03-19 DIAGNOSIS — Z5181 Encounter for therapeutic drug level monitoring: Secondary | ICD-10-CM | POA: Diagnosis not present

## 2024-03-19 DIAGNOSIS — I4821 Permanent atrial fibrillation: Secondary | ICD-10-CM | POA: Diagnosis not present

## 2024-03-19 DIAGNOSIS — I2510 Atherosclerotic heart disease of native coronary artery without angina pectoris: Secondary | ICD-10-CM

## 2024-03-19 LAB — POCT INR: INR: 2.2 (ref 2.0–3.0)

## 2024-03-19 NOTE — Patient Instructions (Signed)
 Continue warfarin 1 tablet daily except 1 1/2 tablets on Mondays Recheck 3 weeks

## 2024-03-19 NOTE — Progress Notes (Signed)
    Cardiology Office Note  Date: 03/19/2024   ID: [name], DOB Nov 20, 1930, MRN [id number]  History of Present Illness: [name] is a 89 y.o. male last seen in April.  He is here today with his wife for a follow-up visit.  Interval records reviewed including hospitalization in May.  He was hospitalized with partial small bowel obstruction which improved with conservative measures, did not require surgery.  States that he is feeling better.  No palpitations or chest pain.  Also undergoing follow-up imaging of carcinoid tumor.  He had a PET scan done with follow-up per oncology next week.  We went over his medications, regimen was adjusted during his recent hospitalization.  I agree with the changes that were made, Coreg  was switched to Toprol -XL 50 mg daily and his Cardizem  CD will ultimately be 360 mg once in the evening.  He is finishing up Cardizem  CD 180 mg tablets twice daily for now.  Also back on Coumadin  with INR 2.2.  I reviewed his ECG today which shows rate controlled atrial fibrillation.  Physical Exam: VS:  BP 122/70 (BP Location: Left Arm)   Pulse 70   Ht 5\' 8"  (1.727 m)   Wt 154 lb 6.4 oz (70 kg)   SpO2 98%   BMI 23.48 kg/m , BMI Body mass index is 23.48 kg/m.  Wt Readings from Last 3 Encounters:  03/19/24 154 lb 6.4 oz (70 kg)  02/27/24 160 lb 4.4 oz (72.7 kg)  02/25/24 160 lb 4.4 oz (72.7 kg)    General: Patient appears comfortable at rest. HEENT: Conjunctiva and lids normal. Neck: Supple, no elevated JVP or carotid bruits. Lungs: Clear to auscultation, nonlabored breathing at rest. Cardiac: Irregularly irregular, 1/6 systolic murmur.  ECG:  An ECG dated 03/01/2024 was personally reviewed today and demonstrated:  Atrial fibrillation with RVR and PVC.  Labwork: 12/24/2023: B Natriuretic Peptide 67.0 01/29/2024: TSH 2.92 02/28/2024: ALT 15; AST 18 03/03/2024: BUN 18; Creatinine, Ser 1.33; Hemoglobin 13.4; Magnesium  1.9; Platelets 212; Potassium 4.0; Sodium  132   Other Studies Reviewed Today:  No interval cardiac testing for review today.  Assessment and Plan:  1.  Permanent atrial fibrillation with CHA2DS2-VASc score of 3.  Heart rate control regimen has been adjusted as discussed above, I agree with current medications.  ECG shows good heart rate control and he does not report any significant palpitations.  Continue Toprol -XL 50 mg in the morning, Cardizem  CD 360 mg in the evening, and maintain follow-up in anticoagulation clinic on Coumadin .   2.  Nonobstructive CAD documented by cardiac catheterization in 2004.  Follow-up Myoview  in 2018 was low risk without active ischemia.  Echocardiogram in December 2023 revealed LVEF 50 to 55%.  Disposition:  Follow up 6 months.  Signed, Gerard Knight, M.D., F.A.C.C. [facility]

## 2024-03-19 NOTE — Patient Instructions (Addendum)

## 2024-03-20 ENCOUNTER — Ambulatory Visit (INDEPENDENT_AMBULATORY_CARE_PROVIDER_SITE_OTHER): Payer: PPO | Admitting: Gastroenterology

## 2024-03-20 ENCOUNTER — Encounter (INDEPENDENT_AMBULATORY_CARE_PROVIDER_SITE_OTHER): Payer: Self-pay | Admitting: Gastroenterology

## 2024-03-20 VITALS — BP 158/73 | HR 73 | Temp 97.3°F | Ht 68.0 in | Wt 153.3 lb

## 2024-03-20 DIAGNOSIS — K50012 Crohn's disease of small intestine with intestinal obstruction: Secondary | ICD-10-CM

## 2024-03-20 NOTE — Progress Notes (Signed)
 Referring Provider: Orest Bio, MD Primary Care Physician:  Orest Bio, MD Primary GI Physician: Dr. Sammi Crick   Chief Complaint  Patient presents with   Crohn's Disease    Patient here today for a follow up on Crohn's patient receives Entyvio  every eight weeks. Next infusion is scheduled 04/29/2024 at [facility]. Patient says he still has some pressure in the lower right abdomen. Patient had PET scan last Tuesday ordered by Dr. Linnell Richardson with the cancer center. He has not been told the results of this as of yet. Has an upcoming appointment with Dr. Linnell Richardson on Monday 03/24/2024.   HPI:   [name] is a 89 y.o. 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  CT A/P with contrast showed Diffusely dilated small bowel up to 4 cm down to a distal ileal surgical anastomotic staple line in the anterior right upper to mid abdomen, past which the more distal terminal ileum is collapsed. Findings are consistent with small bowel obstruction. Some of the right lower abdominal segments demonstrate mild fold thickening and adjacent edema but do not show pneumatosis. Early ischemia not excluded.Trace ascites in the right abdominal mesenteric folds. No free air.  NG tube was placed which provided adequate decompression Query Crohn's vs. Small bowel adhesions Small bowel follow through on 5/16 with contrast in colon and rectosigmoid, no bowel dilation  Last Entyvio  dose was 5/19  PET done 6/3, has follow up with Dr Linnell Richardson on 6/9 5 HIAA on 5/28 was 2.1  Chromagranin A on 5/12 was 1265  Present: States he is feeling much  better since his admission in May. He is having some ongoing LLQ pressure. He denies any pain. He states sensation is present fairly often. Unsure of precipitating factors but feels that having a BM improves the pressure sensation. Having 1-2 solid stools per day. He denies any rectal bleeding or melena. Appetite is good. No nausea or vomiting   Has next Entyvio  on July 15th, this will be his 8th dose.  Has follow up on Monday to discuss PET scan with Dr. Cheree Cords  PET scan: 03/18/24 Focal activity in the second portion duodenum consistent with Well differentiated neuroendocrine tumor. 2. Moderate radiotracer activity in tail the pancreas is also concerning for neuroendocrine tumor. Activity is slightly less than the duodenal lesion. No clear lesion identified on CT. 3. No metastatic adenopathy or mesenteric metastasis. 4. No liver metastasis Last EGD: 02/2024 - 2 cm hiatal hernia.                           - Normal stomach.                           - Submucosal nodule found in the duodenum - Eroded                            NET. Last Colonoscopy: 03/23/2016 - Two small polyps at the recto-sigmoid colon and in the cecum, removed with a cold snare. Resected and retrieved. -  Mild diverticulosis in the sigmoid colon. - External and internal hemorrhoids    Past Medical History:  Diagnosis Date   Anemia    Atrial fibrillation (HCC)    BPH (benign prostatic hyperplasia)    CAD (coronary artery disease)    Catheterization 2004, mild/moderate nonobstructive disease  /   nuclear, 2007, small inferior scar // no ischemia   Crohn's disease (HCC)    Elevated PSA    GERD (gastroesophageal reflux disease)    History of kidney stones    History of pneumonia    Hypothyroidism    Mitral regurgitation    Pneumonia    SBO (small bowel obstruction) (HCC)    Urinary retention    Wears glasses     Past Surgical History:  Procedure Laterality Date   AGILE CAPSULE  10/18/2011   Procedure: AGILE  CAPSULE;  Surgeon: Ruby Corporal, MD;  [facility];  Service: Endoscopy;  Laterality: N/A;  730   BIOPSY  10/05/2022   Procedure: BIOPSY;  Surgeon: Urban Garden, MD;  [facility];  Service: Gastroenterology;;   BIOPSY  01/02/2023   Procedure: BIOPSY;  Surgeon: Urban Garden, MD;  [facility];  Service: Gastroenterology;;   CARDIAC CATHETERIZATION  2004   CATARACT EXTRACTION W/PHACO Right 06/27/2021   Procedure: CATARACT EXTRACTION PHACO AND INTRAOCULAR LENS PLACEMENT (IOC);  Surgeon: Tarri Farm, MD;  Location: [facility];  Service: Ophthalmology;  Laterality: Right;  CDE 21.98   CATARACT EXTRACTION W/PHACO Left 07/11/2021   Procedure: CATARACT EXTRACTION PHACO AND INTRAOCULAR LENS PLACEMENT LEFT EYE;  Surgeon: Tarri Farm, MD;  Location: [facility];  Service: Ophthalmology;  Laterality: Left;  CDE=10.42   CHOLECYSTECTOMY  2010   Dr. DeMason   COLONOSCOPY  2008   DeMason   COLONOSCOPY N/A 03/23/2016   Procedure: COLONOSCOPY;  Surgeon: Ruby Corporal, MD;  [facility];  Service: Endoscopy;  Laterality: N/A;  1:00   CYSTOSCOPY WITH INSERTION OF UROLIFT     ESOPHAGEAL BRUSHING  10/05/2022   Procedure: ESOPHAGEAL BRUSHING;  Surgeon: Urban Garden, MD;  [facility];  Service: Gastroenterology;;   ESOPHAGOGASTRODUODENOSCOPY  11/08/2011   Procedure: ESOPHAGOGASTRODUODENOSCOPY (EGD);  Surgeon: Ruby Corporal, MD;  [facility];  Service: Endoscopy;  Laterality: N/A;  300   ESOPHAGOGASTRODUODENOSCOPY N/A 02/15/2024   Procedure: EGD (ESOPHAGOGASTRODUODENOSCOPY);  Surgeon: Umberto Ganong, Bearl Limes, MD;  [facility];  Service: Gastroenterology;  Laterality: N/A;  2:00PM;ASA 3   ESOPHAGOGASTRODUODENOSCOPY (EGD) WITH PROPOFOL  N/A 10/05/2022   Procedure: ESOPHAGOGASTRODUODENOSCOPY (EGD) WITH PROPOFOL ;  Surgeon: Urban Garden, MD;  [facility];  Service: Gastroenterology;   Laterality: N/A;   ESOPHAGOGASTRODUODENOSCOPY (EGD) WITH PROPOFOL  N/A 01/02/2023   Procedure: ESOPHAGOGASTRODUODENOSCOPY (EGD) WITH PROPOFOL ;  Surgeon: Urban Garden, MD;  [facility];  Service: Gastroenterology;  Laterality: N/A;  1:30 pm, asa 3   HEMOSTASIS CLIP PLACEMENT  10/05/2022   Procedure: HEMOSTASIS CLIP PLACEMENT;  Surgeon: Urban Garden, MD;  [facility];  Service: Gastroenterology;;   POLYPECTOMY  03/23/2016   Procedure: POLYPECTOMY;  Surgeon: Ruby Corporal, MD;  [facility];  Service: Endoscopy;;  Cecal polyp removed via cold forceps recto-sigmoid polyp removed via cold snare   POLYPECTOMY  10/05/2022   Procedure: POLYPECTOMY;  Surgeon: Urban Garden, MD;  [facility];  Service: Gastroenterology;;   TRANSURETHRAL RESECTION OF PROSTATE N/A 04/29/2020   Procedure: TRANSURETHRAL RESECTION OF THE PROSTATE (  TURP);  Surgeon: Trent Frizzle, MD;  Location: [facility];  Service: Urology;  Laterality: N/A;  90 MINS    Current Outpatient Medications  Medication Sig Dispense Refill   acetaminophen  (TYLENOL ) 500 MG tablet Take 1,000 mg by mouth every 6 (six) hours as needed for mild pain (pain score 1-3) or moderate pain (pain score 4-6).     CALCIUM  PO Take 300 mg by mouth in the morning and at bedtime.     Cholecalciferol (VITAMIN D3) 50 MCG (2000 UT) TABS Take 2,000 Units by mouth daily.     cyanocobalamin  (VITAMIN B12) 1000 MCG tablet Take 1,000 mcg by mouth daily.     diltiazem  (CARDIZEM  CD) 360 MG 24 hr capsule Take 1 capsule (360 mg total) by mouth daily. 30 capsule 1   levothyroxine  (SYNTHROID , LEVOTHROID) 75 MCG tablet Take 75 mcg by mouth daily before breakfast.     magnesium  oxide (MAG-OX) 400 (240 Mg) MG tablet Take 400 mg by mouth 2 (two) times daily.     Melatonin 10 MG TABS Take 10 mg by mouth at bedtime.     metoprolol  succinate (TOPROL -XL) 50 MG 24 hr tablet Take 1 tablet  (50 mg total) by mouth daily. 30 tablet 1   nitroGLYCERIN  (NITROSTAT ) 0.4 MG SL tablet Place 0.4 mg under the tongue every 5 (five) minutes as needed. For chest pains. May repeat for up to 3 doses.     omeprazole  (PRILOSEC) 40 MG capsule TAKE ONE CAPSULE BY MOUTH EVERY DAY 90 capsule 1   Probiotic Product (ALIGN) 4 MG CAPS Take 4 mg by mouth in the morning.     traZODone  (DESYREL ) 50 MG tablet Take 50 mg by mouth at bedtime.     Vedolizumab  (ENTYVIO  IV) Inject into the vein. Every 8 weeks.     warfarin (COUMADIN ) 2.5 MG tablet TAKE 1 TABLET TO 1 1/2 TABLETS BY MOUTH DAILY OR AS DIRECTED BY COUMADIN  CLINIC (Patient taking differently: Take 2.5-3.75 mg by mouth daily at 4 PM. TAKE 1 TABLET TO 1 1/2 TABLETS BY MOUTH DAILY OR AS DIRECTED BY COUMADIN  CLINIC) 45 tablet 5   No current facility-administered medications for this visit.    Allergies as of 03/20/2024   (No Known Allergies)    Social History   Socioeconomic History   Marital status: Married    Spouse name: Not on file   Number of children: Not on file   Years of education: Not on file   Highest education level: Not on file  Occupational History   Occupation: Retired-Hospital Radio broadcast assistant: RETIRED  Tobacco Use   Smoking status: Never    Passive exposure: Never   Smokeless tobacco: Never  Vaping Use   Vaping status: Never Used  Substance and Sexual Activity   Alcohol  use: No    Alcohol /week: 0.0 standard drinks of alcohol    Drug use: No   Sexual activity: Not on file  Other Topics Concern   Not on file  Social History Narrative   Married   Social Drivers of Health   Financial Resource Strain: Not on file  Food Insecurity: No Food Insecurity (02/27/2024)   Hunger Vital Sign    Worried About Running Out of Food in the Last Year: Never true    Ran Out of Food in the Last Year: Never true  Transportation Needs: No Transportation Needs (02/27/2024)   PRAPARE - Transportation    Lack of Transportation (Medical):  No    Lack  of Transportation (Non-Medical): No  Physical Activity: Not on file  Stress: Not on file  Social Connections: Socially Integrated (02/27/2024)   Social Connection and Isolation Panel [NHANES]    Frequency of Communication with Friends and Family: More than three times a week    Frequency of Social Gatherings with Friends and Family: More than three times a week    Attends Religious Services: More than 4 times per year    Active Member of [employer] or Organizations: Yes    Attends Engineer, structural: More than 4 times per year    Marital Status: Married    Review of systems General: negative for malaise, night sweats, fever, chills, weight loss Neck: Negative for lumps, goiter, pain and significant neck swelling Resp: Negative for cough, wheezing, dyspnea at rest CV: Negative for chest pain, leg swelling, palpitations, orthopnea GI: denies melena, hematochezia, nausea, vomiting, diarrhea, constipation, dysphagia, odyonophagia, early satiety or unintentional weight loss. +LLQ pressure  MSK: Negative for joint pain or swelling, back pain, and muscle pain. Derm: Negative for itching or rash Psych: Denies depression, anxiety, memory loss, confusion. No homicidal or suicidal ideation.  Heme: Negative for prolonged bleeding, bruising easily, and swollen nodes. Endocrine: Negative for cold or heat intolerance, polyuria, polydipsia and goiter. Neuro: negative for tremor, gait imbalance, syncope and seizures. The remainder of the review of systems is noncontributory.  Physical Exam: BP (!) 160/66 (BP Location: Left Arm, Patient Position: Sitting, Cuff Size: Normal)   Pulse 73   Temp (!) 97.3 F (36.3 C) (Temporal)   Ht 5\' 8"  (1.727 m)   Wt 153 lb 4.8 oz (69.5 kg)   BMI 23.31 kg/m  General:   Alert and oriented. No distress noted. Pleasant and cooperative.  Head:  Normocephalic and atraumatic. Eyes:  Conjuctiva clear without scleral icterus. Mouth:  Oral mucosa pink and  moist. Good dentition. No lesions. Heart: Normal rate and rhythm, s1 and s2 heart sounds present.  Lungs: Clear lung sounds in all lobes. Respirations equal and unlabored. Abdomen:  +BS, soft, non-tender and non-distended. No rebound or guarding. No HSM or masses noted. Derm: No palmar erythema or jaundice Msk:  Symmetrical without gross deformities. Normal posture. Extremities:  Without edema. Neurologic:  Alert and  oriented x4 Psych:  Alert and cooperative. Normal mood and affect.  Invalid input(s): "6 MONTHS"   ASSESSMENT: [name] is a 89 y.o. male presenting today for hospital follow up of SBO in setting of Crohn's disease on Entyvio   Recent admission for SBO in May, as above, unclear if this is secondary to sequela of Crohn's disease or his history of small bowel adhesions. He has done well on Entyvio  thus far and is feeling well currently with 1-2 solid stools per day without rectal bleeding or overt abdominal pain, only some ongoing LLQ pressure that is relieved with a BM. I discussed proceeding with MR enterography to further evaluate his small bowel given recent obstruction. Patient and wife are concerned about having MRI due to previously being told he had a metal clip placed during an endoscopy that would contraindicate MRI exams in the future. I did discuss this with Dr. Sammi Crick who advised it is okay to proceed with MRI despite endo clip placement in the past. Will check fecal calprotectin and also check Entyvio  levels just prior to next dose to ensure drug levels adequate and no presence of antibodies. No colonoscopy recommended at this time due to advanced age.    PLAN:  -check fecal  Calprotectin -MRE abdomen/pelvis w wo contrast  -continue Entyvio  every 8 week -check entyvio  levels just prior to next Entyvio  dose  All questions were answered, patient verbalized understanding and is in agreement with plan as outlined above.   Follow Up: 2 months   Tyliek Timberman L.  Sally Reimers, MSN, APRN, AGNP-C Adult-Gerontology Nurse Practitioner [facility]

## 2024-03-20 NOTE — Patient Instructions (Addendum)
 We will continue with Entyvio  every 8 weeks for now You can do calprotectin (inflammatory stool marker test) at your earliest convenience Please have vedozulimab (entyvio  levels) drawn just prior to next entyvio  infusion I will discuss MRI of the bowels vs. Colonoscopy with Dr. Sammi Crick and be in touch regarding this Please let me know if you have any new or worsening GI symptoms  Follow up 2 months  It was a pleasure to see you today. I want to create trusting relationships with patients and provide genuine, compassionate, and quality care. I truly value your feedback! please be on the lookout for a survey regarding your visit with me today. I appreciate your input about our visit and your time in completing this!    Braylynn Ghan L. Erminia Mcnew, MSN, APRN, AGNP-C Adult-Gerontology Nurse Practitioner [facility]

## 2024-03-24 ENCOUNTER — Telehealth: Payer: Self-pay | Admitting: *Deleted

## 2024-03-24 ENCOUNTER — Inpatient Hospital Stay: Attending: Hematology | Admitting: Hematology

## 2024-03-24 VITALS — BP 143/64 | HR 85 | Temp 98.6°F | Resp 20 | Wt 154.3 lb

## 2024-03-24 DIAGNOSIS — C7A010 Malignant carcinoid tumor of the duodenum: Secondary | ICD-10-CM | POA: Insufficient documentation

## 2024-03-24 DIAGNOSIS — E538 Deficiency of other specified B group vitamins: Secondary | ICD-10-CM | POA: Insufficient documentation

## 2024-03-24 DIAGNOSIS — D696 Thrombocytopenia, unspecified: Secondary | ICD-10-CM | POA: Diagnosis not present

## 2024-03-24 DIAGNOSIS — K50018 Crohn's disease of small intestine with other complication: Secondary | ICD-10-CM | POA: Diagnosis not present

## 2024-03-24 DIAGNOSIS — E3409 Other carcinoid syndrome: Secondary | ICD-10-CM

## 2024-03-24 DIAGNOSIS — E3400 Carcinoid syndrome, unspecified: Secondary | ICD-10-CM | POA: Diagnosis not present

## 2024-03-24 NOTE — Progress Notes (Signed)
 [facility] [address], [place] [address]   Clinic Day:  03/24/2024  Referring physician: Orest Bio, MD  Patient Care Team: Orest Bio, MD as PCP - General (Cardiology) Gerard Knight, MD as PCP - Cardiology (Cardiology)   ASSESSMENT & PLAN:   Assessment:  1.  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 Sammi Crick - EGD (10/05/2022): Nodular mucosa in the second part of the duodenum, measured close to 1 cm. - Pathology: Well-differentiated neuroendocrine tumor, present in submucosal margin, negative for mitotic activity (Ki 67 <1%).  Tumor invades the lamina propria and diffusely involves muscularis propria extending into the submucosa. - EGD (01/02/2023): 15 mm submucosal nodule found in the first part of the duodenum. - Pathology: Well-differentiated neuroendocrine tumor, WHO grade 1, negative for mitotic activity (Ki-67 <1%).  Tumor is diffusely and strongly positive for neuroendocrine marker synaptophysin and shows positivity for chromogranin. - CTAP (12/06/2023): Colonic diverticulosis. - EGD (02/15/2024): 1.5 cm Sub mucosal nodule in the second portion of the duodenum.  Size appears to be similar but surface appears to be eroded. - Dotatate PET scan (03/18/2024): Focal activity in the second portion of duodenum, SUV 19.7.  No clear lesion on CT.  Moderate activity in the tail of the pancreas with SUV 12.4 with no clear lesion on the CT scan. - 24-hour urine 5-HIAA: Normal. - Lanreotide every 4 weeks started on 03/26/2024  2.  Social/family history: - He lives at home with his wife.  He is independent of ADLs and ADLs.  Worked for [employer] and [employer] in the past.  Non-smoker. - Mother had colon cancer.  Plan:  1.  Well-differentiated carcinoid tumor of the second part of the duodenum: - Since last time I have seen him, he was admitted to the hospital with small bowel obstruction.  I  have reviewed records. - He does not report any signs or symptoms of carcinoid syndrome. - We reviewed chromogranin level which was elevated.  We also reviewed 24-hour urine 5-HIAA from 03/12/2024: Normal. - We reviewed dotatate PET scan images from 03/18/2024.  There was focal activity in the second part of the duodenum as well as in the tail of the pancreas. - We talked about controlling both the lesions with somatostatin analog lanreotide every 4 weeks.  We discussed side effects in detail.  He is agreeable.  He will start later this week.  I will see him back prior to start of cycle 2 to see how he is tolerating.  2.  Vitamin B12 deficiency: - Continue B12 1 mg tablet daily.  3.  Thrombocytopenia: - Previous workup for thrombocytopenia was negative.  Thought to be secondary to Entyvio . - Last CBC on 03/03/2024 with platelet count normal.  Continue Entyvio  every 8 weeks.   No orders of the defined types were placed in this encounter.     [name],acting as a Neurosurgeon for Paulett Boros, MD.,have documented all relevant documentation on the behalf of Paulett Boros, MD,as directed by  Paulett Boros, MD while in the presence of Paulett Boros, MD.  I, Paulett Boros MD, have reviewed the above documentation for accuracy and completeness, and I agree with the above.    Paulett Boros, MD   6/9/20252:45 PM  CHIEF COMPLAINT/PURPOSE OF CONSULT:   Diagnosis: Duodenal carcinoid   Cancer Staging  Duodenal carcinoid syndrome Staging form: Small Intestine - Other Histologies, AJCC 8th Edition -  Clinical stage from 02/25/2024: cT3, cN0, cM0 - Unsigned    Prior Therapy: None  Current Therapy: Under workup   HISTORY OF PRESENT ILLNESS:   Oncology History   No history exists.      [name] is a 89 y.o. male presenting to clinic today for evaluation of malignant GIST of small bowel at the request of Urban Garden, MD.  Patient has a  medical history of duodenal carcinoid, small bowel Crohn's disease s/p resection, C. Diff colitis, BPH, CAD, A-fib, chronic diastolic heart failure, GERD, hypothyroidism, anemia and thrombocytopenia.   [name] an EGD on 02/15/24 for follow-up of duodenal NET with Dr. Sammi Crick. A 15 mm protruding, sessile, submucosa nodule was found in the second portion of the duodenum.   Today, he states that he is doing well overall. His appetite level is at 100%. His energy level is at 50%.  INTERVAL HISTORY:   [name] is a 89 y.o. male presenting to the clinic today for follow-up of duodenal carcinoid. He was last seen by me on 02/25/24 in consultation.  Since his last visit, he underwent NM PET dotatate on 03/18/24 that found: ocal activity in the second portion duodenum consistent with Well differentiated neuroendocrine tumor. Moderate radiotracer activity in tail the pancreas is also concerning for neuroendocrine tumor. Activity is slightly less than the duodenal lesion. No clear lesion identified on CT. No metastatic adenopathy or mesenteric metastasis. No liver metastasis  [name] was admitted to the hospital from 02/27/24 to 03/03/24 for a SBO.  Today, he states that he is doing well overall. His appetite level is at 100%. His energy level is at 75%.   PAST MEDICAL HISTORY:   Past Medical History: Past Medical History:  Diagnosis Date   Anemia    Atrial fibrillation (HCC)    BPH (benign prostatic hyperplasia)    CAD (coronary artery disease)    Catheterization 2004, mild/moderate nonobstructive disease  /   nuclear, 2007, small inferior scar // no ischemia   Crohn's disease (HCC)    Elevated PSA    GERD (gastroesophageal reflux disease)    History of kidney stones    History of pneumonia    Hypothyroidism    Mitral regurgitation    Pneumonia    SBO (small bowel obstruction) (HCC)    Urinary retention    Wears glasses     Surgical History: Past Surgical History:  Procedure  Laterality Date   AGILE CAPSULE  10/18/2011   Procedure: AGILE CAPSULE;  Surgeon: Ruby Corporal, MD;  [facility];  Service: Endoscopy;  Laterality: N/A;  730   BIOPSY  10/05/2022   Procedure: BIOPSY;  Surgeon: Urban Garden, MD;  [facility];  Service: Gastroenterology;;   BIOPSY  01/02/2023   Procedure: BIOPSY;  Surgeon: Urban Garden, MD;  [facility];  Service: Gastroenterology;;   CARDIAC CATHETERIZATION  2004   CATARACT EXTRACTION W/PHACO Right 06/27/2021   Procedure: CATARACT EXTRACTION PHACO AND INTRAOCULAR LENS PLACEMENT (IOC);  Surgeon: Tarri Farm, MD;  Location: [facility];  Service: Ophthalmology;  Laterality: Right;  CDE 21.98   CATARACT EXTRACTION W/PHACO Left 07/11/2021   Procedure: CATARACT EXTRACTION PHACO AND INTRAOCULAR LENS PLACEMENT LEFT EYE;  Surgeon: Tarri Farm, MD;  Location: [facility];  Service: Ophthalmology;  Laterality: Left;  CDE=10.42   CHOLECYSTECTOMY  2010   Dr. DeMason   COLONOSCOPY  2008   DeMason   COLONOSCOPY N/A 03/23/2016   Procedure: COLONOSCOPY;  Surgeon:  Ruby Corporal, MD;  [facility]: [facility];  Service: Endoscopy;  Laterality: N/A;  1:00   CYSTOSCOPY WITH INSERTION OF UROLIFT     ESOPHAGEAL BRUSHING  10/05/2022   Procedure: ESOPHAGEAL BRUSHING;  Surgeon: Urban Garden, MD;  [facility];  Service: Gastroenterology;;   ESOPHAGOGASTRODUODENOSCOPY  11/08/2011   Procedure: ESOPHAGOGASTRODUODENOSCOPY (EGD);  Surgeon: Ruby Corporal, MD;  [facility];  Service: Endoscopy;  Laterality: N/A;  300   ESOPHAGOGASTRODUODENOSCOPY N/A 02/15/2024   Procedure: EGD (ESOPHAGOGASTRODUODENOSCOPY);  Surgeon: Umberto Ganong, Bearl Limes, MD;  [facility];  Service: Gastroenterology;  Laterality: N/A;  2:00PM;ASA 3   ESOPHAGOGASTRODUODENOSCOPY (EGD) WITH PROPOFOL  N/A 10/05/2022   Procedure: ESOPHAGOGASTRODUODENOSCOPY (EGD) WITH PROPOFOL ;  Surgeon: Urban Garden, MD;  [facility];  Service: Gastroenterology;  Laterality: N/A;   ESOPHAGOGASTRODUODENOSCOPY (EGD) WITH PROPOFOL  N/A 01/02/2023   Procedure: ESOPHAGOGASTRODUODENOSCOPY (EGD) WITH PROPOFOL ;  Surgeon: Urban Garden, MD;  [facility];  Service: Gastroenterology;  Laterality: N/A;  1:30 pm, asa 3   HEMOSTASIS CLIP PLACEMENT  10/05/2022   Procedure: HEMOSTASIS CLIP PLACEMENT;  Surgeon: Urban Garden, MD;  [facility];  Service: Gastroenterology;;   POLYPECTOMY  03/23/2016   Procedure: POLYPECTOMY;  Surgeon: Ruby Corporal, MD;  [facility];  Service: Endoscopy;;  Cecal polyp removed via cold forceps recto-sigmoid polyp removed via cold snare   POLYPECTOMY  10/05/2022   Procedure: POLYPECTOMY;  Surgeon: Urban Garden, MD;  [facility];  Service: Gastroenterology;;   TRANSURETHRAL RESECTION OF PROSTATE N/A 04/29/2020   Procedure: TRANSURETHRAL RESECTION OF THE PROSTATE (TURP);  Surgeon: Trent Frizzle, MD;  Location: [facility];  Service: Urology;  Laterality: N/A;  31 MINS    Social History: Social History   Socioeconomic History   Marital status: Married    Spouse name: Not on file   Number of children: Not on file   Years of education: Not on file   Highest education level: Not on file  Occupational History   Occupation: Retired-Hospital Radio broadcast assistant: RETIRED  Tobacco Use   Smoking status: Never    Passive exposure: Never   Smokeless tobacco: Never  Vaping Use   Vaping status: Never Used  Substance and Sexual Activity   Alcohol  use: No    Alcohol /week: 0.0 standard drinks of alcohol    Drug use: No   Sexual activity: Not on file  Other Topics Concern   Not on file  Social History Narrative   Married   Social Drivers of Health   Financial Resource Strain: Not on file  Food Insecurity: No Food Insecurity (02/27/2024)   Hunger Vital Sign    Worried  About Running Out of Food in the Last Year: Never true    Ran Out of Food in the Last Year: Never true  Transportation Needs: No Transportation Needs (02/27/2024)   PRAPARE - Administrator, Civil Service (Medical): No    Lack of Transportation (Non-Medical): No  Physical Activity: Not on file  Stress: Not on file  Social Connections: Socially Integrated (02/27/2024)   Social Connection and Isolation Panel [NHANES]    Frequency of Communication with Friends and Family: More than three times a week    Frequency of Social Gatherings with Friends and Family: More than three times a week    Attends Religious Services: More than 4 times per year    Active Member of [employer]  or Organizations: Yes    Attends Banker Meetings: More than 4 times per year    Marital Status: Married  Catering manager Violence: Not At Risk (02/27/2024)   Humiliation, Afraid, Rape, and Kick questionnaire    Fear of Current or Ex-Partner: No    Emotionally Abused: No    Physically Abused: No    Sexually Abused: No    Family History: Family History  Problem Relation Age of Onset   Colon cancer Mother    Heart disease Father    Stroke Brother    Healthy Daughter     Current Medications:  Current Outpatient Medications:    acetaminophen  (TYLENOL ) 500 MG tablet, Take 1,000 mg by mouth every 6 (six) hours as needed for mild pain (pain score 1-3) or moderate pain (pain score 4-6)., Disp: , Rfl:    CALCIUM  PO, Take 300 mg by mouth in the morning and at bedtime., Disp: , Rfl:    Cholecalciferol (VITAMIN D3) 50 MCG (2000 UT) TABS, Take 2,000 Units by mouth daily., Disp: , Rfl:    cyanocobalamin  (VITAMIN B12) 1000 MCG tablet, Take 1,000 mcg by mouth daily., Disp: , Rfl:    diltiazem  (CARDIZEM  CD) 360 MG 24 hr capsule, Take 1 capsule (360 mg total) by mouth daily., Disp: 30 capsule, Rfl: 1   levothyroxine  (SYNTHROID , LEVOTHROID) 75 MCG tablet, Take 75 mcg by mouth daily before breakfast., Disp: ,  Rfl:    magnesium  oxide (MAG-OX) 400 (240 Mg) MG tablet, Take 400 mg by mouth 2 (two) times daily., Disp: , Rfl:    Melatonin 10 MG TABS, Take 10 mg by mouth at bedtime., Disp: , Rfl:    metoprolol  succinate (TOPROL -XL) 50 MG 24 hr tablet, Take 1 tablet (50 mg total) by mouth daily., Disp: 30 tablet, Rfl: 1   nitroGLYCERIN  (NITROSTAT ) 0.4 MG SL tablet, Place 0.4 mg under the tongue every 5 (five) minutes as needed. For chest pains. May repeat for up to 3 doses., Disp: , Rfl:    omeprazole  (PRILOSEC) 40 MG capsule, TAKE ONE CAPSULE BY MOUTH EVERY DAY, Disp: 90 capsule, Rfl: 1   Probiotic Product (ALIGN) 4 MG CAPS, Take 4 mg by mouth in the morning., Disp: , Rfl:    traZODone  (DESYREL ) 50 MG tablet, Take 50 mg by mouth at bedtime., Disp: , Rfl:    Vedolizumab  (ENTYVIO  IV), Inject into the vein. Every 8 weeks., Disp: , Rfl:    warfarin (COUMADIN ) 2.5 MG tablet, TAKE 1 TABLET TO 1 1/2 TABLETS BY MOUTH DAILY OR AS DIRECTED BY COUMADIN  CLINIC (Patient taking differently: Take 2.5-3.75 mg by mouth daily at 4 PM. TAKE 1 TABLET TO 1 1/2 TABLETS BY MOUTH DAILY OR AS DIRECTED BY COUMADIN  CLINIC), Disp: 45 tablet, Rfl: 5   Allergies: No Known Allergies  REVIEW OF SYSTEMS:   Review of Systems  Constitutional:  Negative for chills, fatigue and fever.  HENT:   Negative for lump/mass, mouth sores, nosebleeds, sore throat and trouble swallowing.   Eyes:  Negative for eye problems.  Respiratory:  Negative for cough and shortness of breath.   Cardiovascular:  Negative for chest pain, leg swelling and palpitations.  Gastrointestinal:  Negative for abdominal pain, constipation, diarrhea, nausea and vomiting.  Genitourinary:  Negative for bladder incontinence, difficulty urinating, dysuria, frequency, hematuria and nocturia.   Musculoskeletal:  Negative for arthralgias, back pain, flank pain, myalgias and neck pain.  Skin:  Negative for itching and rash.  Neurological:  Negative for dizziness, headaches and  numbness.  Hematological:  Does not bruise/bleed easily.  Psychiatric/Behavioral:  Negative for depression, sleep disturbance and suicidal ideas. The patient is not nervous/anxious.   All other systems reviewed and are negative.    VITALS:   Blood pressure (!) 143/64, pulse 85, temperature 98.6 F (37 C), temperature source Tympanic, resp. rate 20, weight 154 lb 5.2 oz (70 kg), SpO2 100%.  Wt Readings from Last 3 Encounters:  03/24/24 154 lb 5.2 oz (70 kg)  03/20/24 153 lb 4.8 oz (69.5 kg)  03/19/24 154 lb 6.4 oz (70 kg)    Body mass index is 23.46 kg/m.  Performance status (ECOG): 1 - Symptomatic but completely ambulatory  PHYSICAL EXAM:   Physical Exam Vitals and nursing note reviewed. Exam conducted with a chaperone present.  Constitutional:      Appearance: Normal appearance.  Cardiovascular:     Rate and Rhythm: Normal rate and regular rhythm.     Pulses: Normal pulses.     Heart sounds: Normal heart sounds.  Pulmonary:     Effort: Pulmonary effort is normal.     Breath sounds: Normal breath sounds.  Abdominal:     Palpations: Abdomen is soft. There is no hepatomegaly, splenomegaly or mass.     Tenderness: There is no abdominal tenderness.  Musculoskeletal:     Right lower leg: No edema.     Left lower leg: No edema.  Lymphadenopathy:     Cervical: No cervical adenopathy.     Right cervical: No superficial, deep or posterior cervical adenopathy.    Left cervical: No superficial, deep or posterior cervical adenopathy.     Upper Body:     Right upper body: No supraclavicular or axillary adenopathy.     Left upper body: No supraclavicular or axillary adenopathy.  Neurological:     General: No focal deficit present.     Mental Status: He is alert and oriented to person, place, and time.  Psychiatric:        Mood and Affect: Mood normal.        Behavior: Behavior normal.     LABS:   CBC    Component Value Date/Time   WBC 11.4 (H) 03/03/2024 0455   RBC 4.49  03/03/2024 0455   HGB 13.4 03/03/2024 0455   HGB 12.9 (L) 05/03/2023 0844   HCT 40.7 03/03/2024 0455   HCT 38.7 05/03/2023 0844   PLT 212 03/03/2024 0455   PLT 67 (LL) 05/09/2023 0858   MCV 90.6 03/03/2024 0455   MCV 91 05/03/2023 0844   MCH 29.8 03/03/2024 0455   MCHC 32.9 03/03/2024 0455   RDW 13.4 03/03/2024 0455   RDW 14.2 05/03/2023 0844   LYMPHSABS 1.5 02/27/2024 0537   LYMPHSABS 2.1 05/03/2023 0844   MONOABS 0.6 02/27/2024 0537   EOSABS 0.1 02/27/2024 0537   EOSABS 0.2 05/03/2023 0844   BASOSABS 0.1 02/27/2024 0537   BASOSABS 0.1 05/03/2023 0844    CMP    Component Value Date/Time   NA 132 (L) 03/03/2024 0455   K 4.0 03/03/2024 0455   CL 102 03/03/2024 0455   CO2 24 03/03/2024 0455   GLUCOSE 107 (H) 03/03/2024 0455   BUN 18 03/03/2024 0455   CREATININE 1.33 (H) 03/03/2024 0455   CREATININE 1.76 (H) 04/03/2023 0828   CALCIUM  8.6 (L) 03/03/2024 0455   PROT 6.3 (L) 02/28/2024 0457   ALBUMIN 3.4 (L) 02/28/2024 0457   AST 18 02/28/2024 0457   ALT 15 02/28/2024 0457   ALKPHOS 60 02/28/2024  0457   BILITOT 1.7 (H) 02/28/2024 0457   GFRNONAA 51 (L) 03/03/2024 0455     No results found for: "CEA1", "CEA" / No results found for: "CEA1", "CEA" No results found for: "PSA1" No results found for: "CAN199" No results found for: "CAN125"  Lab Results  Component Value Date   TOTALPROTELP 7.1 05/22/2023   TOTALPROTELP 7.5 05/22/2023   ALBUMINELP 4.0 05/22/2023   A1GS 0.2 05/22/2023   A2GS 0.7 05/22/2023   BETS 1.0 05/22/2023   GAMS 1.1 05/22/2023   MSPIKE Not Observed 05/22/2023   SPEI Comment 05/22/2023   Lab Results  Component Value Date   TIBC 349 05/22/2023   FERRITIN 96 05/22/2023   IRONPCTSAT 23 05/22/2023   Lab Results  Component Value Date   LDH 145 05/22/2023     STUDIES:   NM PET DOTATATE SKULL BASE TO MID THIGH Result Date: 03/18/2024 CLINICAL DATA:  Well differentiated neuroendocrine tumor. EXAM: NUCLEAR MEDICINE PET SKULL BASE TO THIGH  TECHNIQUE: 4.1 mCi copper  64 DOTATATE was injected intravenously. Full-ring PET imaging was performed from the skull base to thigh after the radiotracer. CT data was obtained and used for attenuation correction and anatomic localization. COMPARISON:  None Available. FINDINGS: NECK No radiotracer activity in neck lymph nodes. Incidental CT findings: None CHEST No radiotracer accumulation within mediastinal or hilar lymph nodes. No suspicious pulmonary nodules on the CT scan. Incidental CT finding:None ABDOMEN/PELVIS Focus intense radiotracer activity along the wall of the second portion duodenum opposite the pancreas with SUV max equal 19.7 on image 116. No CT correlation on noncontrast exam. No clear lesion on comparison contrast CT 02/27/2024. No additional foci of abnormal radiotracer within bowel. Patient status post partial small bowel resection anastomosis in the RIGHT abdomen. Colon normal. There is moderate radiotracer activity in the tail the pancreas with SUV max equal 12.4. No clear lesion on comparison contrast CT at this site. Physiologic activity in the adrenal glands. Mesenteric implants. No adenopathy. No liver metastasis Physiologic activity noted in the liver, spleen, adrenal glands and kidneys. Incidental CT findings:None SKELETON No focal activity to suggest skeletal metastasis. Incidental CT findings:None IMPRESSION: 1. Focal activity in the second portion duodenum consistent with Well differentiated neuroendocrine tumor. 2. Moderate radiotracer activity in tail the pancreas is also concerning for neuroendocrine tumor. Activity is slightly less than the duodenal lesion. No clear lesion identified on CT. 3. No metastatic adenopathy or mesenteric metastasis. 4. No liver metastasis Electronically Signed   By: Deboraha Fallow M.D.   On: 03/18/2024 15:29   DG Abd Portable 1V-Small Bowel Obstruction Protocol-initial, 8 hr delay Result Date: 02/29/2024 CLINICAL DATA:  8 hour follow-up EXAM: PORTABLE  ABDOMEN - 1 VIEW COMPARISON:  02/29/2024, 11:43 a.m. FINDINGS: The bowel gas pattern is normal. No radio-opaque calculi or other significant radiographic abnormality are seen. Oral contrast in colon and rectosigmoid. IMPRESSION: Oral contrast in colon and rectosigmoid.  No bowel dilatation. Electronically Signed   By: Sydell Eva M.D.   On: 02/29/2024 22:33   DG Abd Portable 1V-Small Bowel Protocol-Position Verification Result Date: 02/29/2024 CLINICAL DATA:  NG placement. EXAM: PORTABLE ABDOMEN - 1 VIEW COMPARISON:  CT abdomen pelvis dated 02/27/2024. FINDINGS: Partially visualized enteric tube with tip and side port in the upper abdomen, likely in the proximal stomach. Mildly dilated small bowel loops measure up to 3 cm diameter. IMPRESSION: Enteric tube with tip and side port in the proximal stomach. Electronically Signed   By: Angus Bark M.D.   On: 02/29/2024  14:41   DG Chest Portable 1 View Result Date: 02/27/2024 EXAM: 1 VIEW XRAY OF THE CHEST 02/27/2024 08:02:00 AM COMPARISON: 12/06/2023 chest radiograph. CLINICAL HISTORY: NG placement. Pt states he had multiple episodes of diarrhea yesterday and today; pt states he feels very weak and is unable to walk; Pt c/o nausea but no vomiting; Pt had 2 doses of pepto bismol yesterday. Hx of afib, CAD, pneumonia. Non smoker. FINDINGS: LUNGS AND PLEURA: No consolidation. No pulmonary edema. No pleural effusion. No pneumothorax. HEART AND MEDIASTINUM: No acute abnormality of the cardiac and mediastinal silhouettes. BONES AND SOFT TISSUES: No acute osseous abnormality. LINES AND TUBES: Enteric tube tip just below the esophagogastric junction with side port in the lower thoracic esophagus. Recommended advancing 8 cm. SOFT TISSUES: Surgical clips in the right upper quadrant of the abdomen. IMPRESSION: 1. Enteric tube tip just below the esophagogastric junction with side port in the lower thoracic esophagus. Recommend advancing 8 cm. 2. No active  cardiopulmonary disease. Electronically signed by: Karlyn Overman MD 02/27/2024 08:20 AM EDT RP Workstation: [id number]   CT ABDOMEN PELVIS W CONTRAST Result Date: 02/27/2024 CLINICAL DATA:  Abdominal pain, diarrhea, and weakness. EXAM: CT ABDOMEN AND PELVIS WITH CONTRAST TECHNIQUE: Multidetector CT imaging of the abdomen and pelvis was performed using the standard protocol following bolus administration of intravenous contrast. RADIATION DOSE REDUCTION: This exam was performed according to the departmental dose-optimization program which includes automated exposure control, adjustment of the mA and/or kV according to patient size and/or use of iterative reconstruction technique. CONTRAST:  OMNIPAQUE  IOHEXOL  300 MG/ML  SOLN COMPARISON:  Numerous prior CTs back to 2009. The 2 most recent are both with contrast and dated 12/06/2023 and 12/22/2022. FINDINGS: Lower chest: There is scarring in the medial right lung base, linear atelectasis in the posterior left lower lobe. Lung bases are clear of infiltrates. Hepatobiliary: Chronic 8 mm too small to characterize hypodensity is unchanged in hepatic segment 6, probable small cyst. Remainder of liver is unremarkable. Gallbladder is absent without bile duct dilatation. Pancreas: No abnormality. Spleen: Stable 2.4 cm splenic cyst.  Otherwise unremarkable spleen. Adrenals/Urinary Tract: No adrenal mass. There is bilateral renal cortical thinning, scattered scarring on the left. There are few small bilateral renal cysts requiring no follow-up imaging. No mass enhancement is seen. There is no urinary stone or obstruction. There is mild chronic bladder thickening and trabeculation, small scattered diverticula. Findings likely due to chronic bladder outlet obstruction. No bladder mass is seen. Stomach/Bowel: Small hiatal hernia. Unremarkable contracted stomach. Beginning at the ligament of Treitz, the small bowel is diffusely dilated up to 4 cm down to a distal ileal surgical  anastomotic staple line in the anterior right upper to mid abdomen, past which the the more distal terminal ileum is collapsed. I do not see further areas of transition. Some of the right lower abdominal segments demonstrate mild fold thickening and adjacent edema but do not show pneumatosis. There are stones within a normal caliber appendix. The colon wall is normal in thickness. There is left-sided diverticulosis with no evidence of diverticulitis. Vascular/Lymphatic: Moderate aortoiliac calcific plaques. Infrarenal aortic ectasia again measures 2.5 cm. No other significant vascular findings.  No adenopathy. Reproductive: Slightly prominent prostate with TURP defect. Other: Bilateral inguinal fat hernias. Trace ascites in the right abdominal mesenteric folds. No free air. Musculoskeletal: Osteopenia with degenerative changes of the spine. Bridging osteophytes left SI joint. Bilateral hip DJD. No acute or other significant osseous findings. IMPRESSION: 1. Diffusely dilated small bowel up to  4 cm down to a distal ileal surgical anastomotic staple line in the anterior right upper to mid abdomen, past which the more distal terminal ileum is collapsed. Findings are consistent with small bowel obstruction. 2. Some of the right lower abdominal segments demonstrate mild fold thickening and adjacent edema but do not show pneumatosis. Early ischemia not excluded. 3. Trace ascites in the right abdominal mesenteric folds. No free air. 4. Diverticulosis without evidence of diverticulitis. 5. Aortic and coronary artery atherosclerosis. 6. Chronic bladder thickening and trabeculation with small diverticula, likely due to chronic bladder outlet obstruction. Query underlying cystitis. 7. Small hiatal hernia.  Bilateral inguinal fat hernias. 8. Osteopenia and degenerative change. Aortic Atherosclerosis (ICD10-I70.0). Electronically Signed   By: Denman Fischer M.D.   On: 02/27/2024 06:42

## 2024-03-24 NOTE — Telephone Encounter (Signed)
 Carlan, Chelsea L, NP  Umberto Ganong, Daniel, MD; Feliz Hosteller, CMA Anneth Brunell, can we get the patient set up for MR Enterography Abd/Pelvis w and wo contrast, diagnosis Crohn's disease of small bowel, Small bowel obstruction

## 2024-03-24 NOTE — Patient Instructions (Addendum)
 [facility] at [facility] Discharge Instructions   You were seen and examined today by Dr. Cheree Cords.  He reviewed the results of your lab work which are normal/stable.   He reviewed the results of your PET scan. There are several spots that are lighting up in the pancreas, suspicious for cancer. 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   Dr. Linnell Richardson would like to start you on an injection called Lanreotide. It is given once a month. This is given to control the tumor from growing and spreading.   We will see you back in .   Return as scheduled.    Thank you for choosing [facility] at [facility] to provide your oncology and hematology care.  To afford each patient quality time with our provider, please arrive at least 15 minutes before your scheduled appointment time.   If you have a lab appointment with the Cancer Center please come in thru the Main Entrance and check in at the main information desk.  You need to re-schedule your appointment should you arrive 10 or more minutes late.  We strive to give you quality time with our providers, and arriving late affects you and other patients whose appointments are after yours.  Also, if you no show three or more times for appointments you may be dismissed from the clinic at the providers discretion.     Again, thank you for choosing [facility].  Our hope is that these requests will decrease the amount of time that you wait before being seen by our physicians.       _____________________________________________________________  Should you have questions after your visit to [facility], please contact our office at [phone] and follow the prompts.  Our office hours are 8:00 a.m. and 4:30 p.m. Monday - Friday.  Please note that voicemails left after 4:00 p.m. may not be returned until the following business day.  We are closed weekends and major holidays.  You  do have access to a nurse 24-7, just call the main number to the clinic [phone] and do not press any options, hold on the line and a nurse will answer the phone.    For prescription refill requests, have your pharmacy contact our office and allow 72 hours.    Due to Covid, you will need to wear a mask upon entering the hospital. If you do not have a mask, a mask will be given to you at the Main Entrance upon arrival. For doctor visits, patients may have 1 support person age 88 or older with them. For treatment visits, patients can not have anyone with them due to social distancing guidelines and our immunocompromised population.

## 2024-03-24 NOTE — Telephone Encounter (Signed)
 Called pt, LMOVM and also advised will send mychart message

## 2024-03-25 DIAGNOSIS — K50012 Crohn's disease of small intestine with intestinal obstruction: Secondary | ICD-10-CM | POA: Diagnosis not present

## 2024-03-27 ENCOUNTER — Ambulatory Visit (INDEPENDENT_AMBULATORY_CARE_PROVIDER_SITE_OTHER): Payer: Self-pay | Admitting: Gastroenterology

## 2024-03-27 ENCOUNTER — Inpatient Hospital Stay

## 2024-03-27 VITALS — BP 163/62 | HR 78 | Temp 97.8°F | Resp 18

## 2024-03-27 DIAGNOSIS — E3409 Other carcinoid syndrome: Secondary | ICD-10-CM

## 2024-03-27 DIAGNOSIS — C7A010 Malignant carcinoid tumor of the duodenum: Secondary | ICD-10-CM | POA: Diagnosis not present

## 2024-03-27 LAB — CALPROTECTIN, FECAL: Calprotectin, Fecal: 325 ug/g — ABNORMAL HIGH (ref 0–120)

## 2024-03-27 MED ORDER — LANREOTIDE ACETATE 120 MG/0.5ML ~~LOC~~ SOLN
120.0000 mg | Freq: Once | SUBCUTANEOUS | Status: AC
  Administered 2024-03-27: 120 mg via SUBCUTANEOUS
  Filled 2024-03-27: qty 120

## 2024-03-27 NOTE — Progress Notes (Signed)
 [name] presents today for injection per the provider's orders.  Lanreotide administration without incident; injection site WNL; see MAR for injection details.  Patient tolerated procedure well and without incident.  No complaints at this time. Discharged from clinic ambulatory in stable condition. Alert and oriented x 3. F/U with [facility] as scheduled.

## 2024-03-27 NOTE — Patient Instructions (Signed)
 [facility] [place] - A DEPT OF [facility]  Discharge Instructions: Thank you for choosing [facility] to provide your oncology and hematology care.  If you have a lab appointment with the Cancer Center - please note that after April 8th, 2024, all labs will be drawn in the cancer center.  You do not have to check in or register with the main entrance as you have in the past but will complete your check-in in the cancer center.  Wear comfortable clothing and clothing appropriate for easy access to any Portacath or PICC line.   We strive to give you quality time with your provider. You may need to reschedule your appointment if you arrive late (15 or more minutes).  Arriving late affects you and other patients whose appointments are after yours.  Also, if you miss three or more appointments without notifying the office, you may be dismissed from the clinic at the provider's discretion.      For prescription refill requests, have your pharmacy contact our office and allow 72 hours for refills to be completed.    Today you received the following chemotherapy and/or immunotherapy agents Lanreotide.  Lanreotide Injection What is this medication? LANREOTIDE (lan REE oh tide) treats high levels of growth hormone (acromegaly). It is used when other therapies have not worked well enough or cannot be tolerated. It works by reducing the amount of growth hormone your body makes. This reduces symptoms and the risk of health problems caused by too much growth hormone, such as diabetes and heart disease. It may also be used to treat neuroendocrine tumors, a cancer of the cells that release hormones and other substances in your body. It works by slowing down the release of these substances from the cells. This slows tumor growth. It also decreases the symptoms of carcinoid syndrome, such as flushing or diarrhea. This medicine may be used for other purposes; ask your health care  provider or pharmacist if you have questions. COMMON BRAND NAME(S): Somatuline Depot What should I tell my care team before I take this medication? They need to know if you have any of these conditions: Diabetes Gallbladder disease Heart disease Kidney disease Liver disease Pancreatic disease Thyroid disease An unusual or allergic reaction to lanreotide, other medications, foods, dyes, or preservatives Pregnant or trying to get pregnant Breastfeeding How should I use this medication? This medication is injected under the skin. It is given by your care team in a hospital or clinic setting. Talk to your care team about the use of this medication in children. Special care may be needed. Overdosage: If you think you have taken too much of this medicine contact a poison control center or emergency room at once. NOTE: This medicine is only for you. Do not share this medicine with others. What if I miss a dose? Keep appointments for follow-up doses. It is important not to miss your dose. Call your care team if you are unable to keep an appointment. What may interact with this medication? Bromocriptine Cyclosporine Certain medications for blood pressure, heart disease, irregular heartbeat Certain medications for diabetes Quinidine Terfenadine This list may not describe all possible interactions. Give your health care provider a list of all the medicines, herbs, non-prescription drugs, or dietary supplements you use. Also tell them if you smoke, drink alcohol, or use illegal drugs. Some items may interact with your medicine. What should I watch for while using this medication? Visit your care team  for regular checks on your progress. Tell your care team if your symptoms do not start to get better or if they get worse. Your condition will be monitored carefully while you are receiving this medication. You may need blood work while you are taking this medication. This medication may increase  blood sugar. The risk may be higher in patients who already have diabetes. Ask your care team what you can do to lower your risk of diabetes while taking this medication. Talk to your care team if you wish to become pregnant or think you may be pregnant. This medication can cause serious birth defects. Do not breast-feed while taking this medication and for 6 months after stopping therapy. This medication may cause infertility. Talk to your care team if you are concerned about your fertility. What side effects may I notice from receiving this medication? Side effects that you should report to your care team as soon as possible: Allergic reactions--skin rash, itching, hives, swelling of the face, lips, tongue, or throat Gallbladder problems--severe stomach pain, nausea, vomiting, fever High blood sugar (hyperglycemia)--increased thirst or amount of urine, unusual weakness or fatigue, blurry vision Increase in blood pressure Low blood sugar (hypoglycemia)--pale, blue or purple skin or lips, sweating, fussiness, rapid heartbeat, poor feeding, low body temperature Low thyroid levels (hypothyroidism)--unusual weakness or fatigue, increased sensitivity to cold, constipation, hair loss, dry skin, weight gain, feelings of depression Oily or light-colored stools, diarrhea, bloating, weight loss Slow heartbeat--dizziness, feeling faint or lightheaded, confusion, trouble breathing, unusual weakness or fatigue Side effects that usually do not require medical attention (report these to your care team if they continue or are bothersome): Diarrhea Dizziness Headache Muscle spasms Nausea Pain, redness, or irritation at injection site Stomach pain This list may not describe all possible side effects. Call your doctor for medical advice about side effects. You may report side effects to FDA at 1-800-FDA-1088. Where should I keep my medication? This medication is given in a hospital or clinic. It will not be  stored at home. NOTE: This sheet is a summary. It may not cover all possible information. If you have questions about this medicine, talk to your doctor, pharmacist, or health care provider.  2024 Elsevier/Gold Standard (2023-09-14 00:00:00)       To help prevent nausea and vomiting after your treatment, we encourage you to take your nausea medication as directed.  BELOW ARE SYMPTOMS THAT SHOULD BE REPORTED IMMEDIATELY: *FEVER GREATER THAN 100.4 F (38 C) OR HIGHER *CHILLS OR SWEATING *NAUSEA AND VOMITING THAT IS NOT CONTROLLED WITH YOUR NAUSEA MEDICATION *UNUSUAL SHORTNESS OF BREATH *UNUSUAL BRUISING OR BLEEDING *URINARY PROBLEMS (pain or burning when urinating, or frequent urination) *BOWEL PROBLEMS (unusual diarrhea, constipation, pain near the anus) TENDERNESS IN MOUTH AND THROAT WITH OR WITHOUT PRESENCE OF ULCERS (sore throat, sores in mouth, or a toothache) UNUSUAL RASH, SWELLING OR PAIN  UNUSUAL VAGINAL DISCHARGE OR ITCHING   Items with * indicate a potential emergency and should be followed up as soon as possible or go to the Emergency Department if any problems should occur.  Please show the CHEMOTHERAPY ALERT CARD or IMMUNOTHERAPY ALERT CARD at check-in to the Emergency Department and triage nurse.  Should you have questions after your visit or need to cancel or reschedule your appointment, please contact [facility]  - A DEPT OF [facility] [phone]  and follow the prompts.  Office hours are 8:00 a.m. to 4:30 p.m. Monday - Friday. Please note that voicemails  left after 4:00 p.m. may not be returned until the following business day.  We are closed weekends and major holidays. You have access to a nurse at all times for urgent questions. Please call the main number to the clinic [phone] and follow the prompts.  For any non-urgent questions, you may also contact your provider using MyChart. We now offer e-Visits for anyone 63 and older to  request care online for non-urgent symptoms. For details visit [email or web address].   Also download the MyChart app! Go to the app store, search "MyChart", open the app, select [place], and log in with your MyChart username and password.

## 2024-03-29 ENCOUNTER — Ambulatory Visit (HOSPITAL_COMMUNITY)
Admission: RE | Admit: 2024-03-29 | Discharge: 2024-03-29 | Disposition: A | Discharge status: Home / Self Care | Source: Ambulatory Visit | Attending: Gastroenterology | Admitting: Gastroenterology

## 2024-03-29 DIAGNOSIS — K50012 Crohn's disease of small intestine with intestinal obstruction: Secondary | ICD-10-CM | POA: Diagnosis not present

## 2024-03-29 DIAGNOSIS — K4090 Unilateral inguinal hernia, without obstruction or gangrene, not specified as recurrent: Secondary | ICD-10-CM | POA: Diagnosis not present

## 2024-03-29 DIAGNOSIS — C7A8 Other malignant neuroendocrine tumors: Secondary | ICD-10-CM | POA: Diagnosis not present

## 2024-03-29 DIAGNOSIS — K449 Diaphragmatic hernia without obstruction or gangrene: Secondary | ICD-10-CM | POA: Diagnosis not present

## 2024-03-29 DIAGNOSIS — K5730 Diverticulosis of large intestine without perforation or abscess without bleeding: Secondary | ICD-10-CM | POA: Diagnosis not present

## 2024-03-29 MED ORDER — GADOBUTROL 1 MMOL/ML IV SOLN
7.0000 mL | Freq: Once | INTRAVENOUS | Status: AC | PRN
  Administered 2024-03-29: 7 mL via INTRAVENOUS

## 2024-04-01 DIAGNOSIS — L570 Actinic keratosis: Secondary | ICD-10-CM | POA: Diagnosis not present

## 2024-04-02 DIAGNOSIS — I4891 Unspecified atrial fibrillation: Secondary | ICD-10-CM | POA: Diagnosis not present

## 2024-04-02 DIAGNOSIS — K5090 Crohn's disease, unspecified, without complications: Secondary | ICD-10-CM | POA: Diagnosis not present

## 2024-04-02 DIAGNOSIS — E039 Hypothyroidism, unspecified: Secondary | ICD-10-CM | POA: Diagnosis not present

## 2024-04-09 ENCOUNTER — Other Ambulatory Visit: Payer: Self-pay | Admitting: Cardiology

## 2024-04-09 ENCOUNTER — Encounter: Attending: Gastroenterology | Admitting: *Deleted

## 2024-04-09 DIAGNOSIS — I4891 Unspecified atrial fibrillation: Secondary | ICD-10-CM | POA: Diagnosis not present

## 2024-04-09 DIAGNOSIS — Z5181 Encounter for therapeutic drug level monitoring: Secondary | ICD-10-CM | POA: Insufficient documentation

## 2024-04-09 LAB — POCT INR: INR: 3.4 — AB (ref 2.0–3.0)

## 2024-04-09 NOTE — Progress Notes (Signed)
Please see anticoagulation encounter.

## 2024-04-09 NOTE — Patient Instructions (Signed)
 Hold warfarin tonight then resume 1 tablet daily except 1 1/2 tablets on Mondays Recheck 2 weeks

## 2024-04-09 NOTE — Telephone Encounter (Signed)
 Refill request for warfarin:  Last INR was 2.2 on 03/19/24 Next INR due 04/09/24 LOV was 03/19/24  Refill approved.

## 2024-04-14 DIAGNOSIS — E039 Hypothyroidism, unspecified: Secondary | ICD-10-CM | POA: Diagnosis not present

## 2024-04-14 DIAGNOSIS — G4700 Insomnia, unspecified: Secondary | ICD-10-CM | POA: Diagnosis not present

## 2024-04-14 DIAGNOSIS — I10 Essential (primary) hypertension: Secondary | ICD-10-CM | POA: Diagnosis not present

## 2024-04-14 DIAGNOSIS — M545 Low back pain, unspecified: Secondary | ICD-10-CM | POA: Diagnosis not present

## 2024-04-14 DIAGNOSIS — E782 Mixed hyperlipidemia: Secondary | ICD-10-CM | POA: Diagnosis not present

## 2024-04-14 DIAGNOSIS — I482 Chronic atrial fibrillation, unspecified: Secondary | ICD-10-CM | POA: Diagnosis not present

## 2024-04-23 ENCOUNTER — Other Ambulatory Visit: Payer: Self-pay

## 2024-04-23 ENCOUNTER — Encounter: Attending: Gastroenterology | Admitting: *Deleted

## 2024-04-23 DIAGNOSIS — D3A8 Other benign neuroendocrine tumors: Secondary | ICD-10-CM

## 2024-04-23 DIAGNOSIS — K50018 Crohn's disease of small intestine with other complication: Secondary | ICD-10-CM | POA: Insufficient documentation

## 2024-04-23 DIAGNOSIS — I4891 Unspecified atrial fibrillation: Secondary | ICD-10-CM | POA: Diagnosis not present

## 2024-04-23 DIAGNOSIS — E538 Deficiency of other specified B group vitamins: Secondary | ICD-10-CM

## 2024-04-23 DIAGNOSIS — Z5181 Encounter for therapeutic drug level monitoring: Secondary | ICD-10-CM | POA: Insufficient documentation

## 2024-04-23 DIAGNOSIS — D696 Thrombocytopenia, unspecified: Secondary | ICD-10-CM

## 2024-04-23 DIAGNOSIS — E3409 Other carcinoid syndrome: Secondary | ICD-10-CM

## 2024-04-23 LAB — POCT INR: INR: 3 (ref 2.0–3.0)

## 2024-04-23 NOTE — Progress Notes (Signed)
Please see anticoagulation encounter.

## 2024-04-23 NOTE — Patient Instructions (Signed)
 Continue warfarin 1 tablet daily except 1 1/2 tablets on Mondays Recheck 3 weeks

## 2024-04-24 ENCOUNTER — Inpatient Hospital Stay

## 2024-04-24 ENCOUNTER — Inpatient Hospital Stay: Attending: Hematology | Admitting: Hematology

## 2024-04-24 VITALS — BP 132/76 | HR 74 | Temp 97.5°F | Resp 18 | Ht 68.0 in | Wt 154.0 lb

## 2024-04-24 DIAGNOSIS — I4891 Unspecified atrial fibrillation: Secondary | ICD-10-CM | POA: Diagnosis not present

## 2024-04-24 DIAGNOSIS — Z800 Family history of malignant neoplasm of digestive organs: Secondary | ICD-10-CM | POA: Insufficient documentation

## 2024-04-24 DIAGNOSIS — E538 Deficiency of other specified B group vitamins: Secondary | ICD-10-CM

## 2024-04-24 DIAGNOSIS — K50018 Crohn's disease of small intestine with other complication: Secondary | ICD-10-CM

## 2024-04-24 DIAGNOSIS — E3409 Other carcinoid syndrome: Secondary | ICD-10-CM

## 2024-04-24 DIAGNOSIS — D696 Thrombocytopenia, unspecified: Secondary | ICD-10-CM

## 2024-04-24 DIAGNOSIS — D3A8 Other benign neuroendocrine tumors: Secondary | ICD-10-CM

## 2024-04-24 DIAGNOSIS — C7A010 Malignant carcinoid tumor of the duodenum: Secondary | ICD-10-CM | POA: Insufficient documentation

## 2024-04-24 DIAGNOSIS — Z79899 Other long term (current) drug therapy: Secondary | ICD-10-CM | POA: Insufficient documentation

## 2024-04-24 LAB — COMPREHENSIVE METABOLIC PANEL WITH GFR
ALT: 21 U/L (ref 0–44)
AST: 26 U/L (ref 15–41)
Albumin: 4 g/dL (ref 3.5–5.0)
Alkaline Phosphatase: 87 U/L (ref 38–126)
Anion gap: 14 (ref 5–15)
BUN: 17 mg/dL (ref 8–23)
CO2: 23 mmol/L (ref 22–32)
Calcium: 9.3 mg/dL (ref 8.9–10.3)
Chloride: 101 mmol/L (ref 98–111)
Creatinine, Ser: 1.43 mg/dL — ABNORMAL HIGH (ref 0.61–1.24)
GFR, Estimated: 47 mL/min — ABNORMAL LOW (ref >60)
Glucose, Bld: 178 mg/dL — ABNORMAL HIGH (ref 70–99)
Potassium: 3.9 mmol/L (ref 3.5–5.1)
Sodium: 138 mmol/L (ref 135–145)
Total Bilirubin: 0.8 mg/dL (ref 0.0–1.2)
Total Protein: 7.7 g/dL (ref 6.5–8.1)

## 2024-04-24 LAB — CBC WITH DIFFERENTIAL/PLATELET
Abs Immature Granulocytes: 0.02 K/uL (ref 0.00–0.07)
Basophils Absolute: 0.1 K/uL (ref 0.0–0.1)
Basophils Relative: 1 %
Eosinophils Absolute: 0.1 K/uL (ref 0.0–0.5)
Eosinophils Relative: 1 %
HCT: 44.8 % (ref 39.0–52.0)
Hemoglobin: 14.9 g/dL (ref 13.0–17.0)
Immature Granulocytes: 0 %
Lymphocytes Relative: 21 %
Lymphs Abs: 1.7 K/uL (ref 0.7–4.0)
MCH: 30.4 pg (ref 26.0–34.0)
MCHC: 33.3 g/dL (ref 30.0–36.0)
MCV: 91.4 fL (ref 80.0–100.0)
Monocytes Absolute: 0.4 K/uL (ref 0.1–1.0)
Monocytes Relative: 5 %
Neutro Abs: 5.8 K/uL (ref 1.7–7.7)
Neutrophils Relative %: 72 %
Platelets: 207 K/uL (ref 150–400)
RBC: 4.9 MIL/uL (ref 4.22–5.81)
RDW: 13.6 % (ref 11.5–15.5)
WBC: 8.1 K/uL (ref 4.0–10.5)
nRBC: 0 % (ref 0.0–0.2)

## 2024-04-24 LAB — VITAMIN B12: Vitamin B-12: 1724 pg/mL — ABNORMAL HIGH (ref 180–914)

## 2024-04-24 MED ORDER — LANREOTIDE ACETATE 120 MG/0.5ML ~~LOC~~ SOLN
120.0000 mg | Freq: Once | SUBCUTANEOUS | Status: AC
  Administered 2024-04-24: 120 mg via SUBCUTANEOUS

## 2024-04-24 NOTE — Progress Notes (Signed)
 Patient tolerated Lanreotide injection with no complaints voiced.  Site clean and dry with no bruising or swelling noted.  No complaints of pain.  Discharged with vital signs stable and no signs or symptoms of distress noted.

## 2024-04-24 NOTE — Progress Notes (Signed)
 [facility] [address], [place] [address]   Clinic Day:  04/24/2024  Referring physician: Atilano Deward ORN, MD  Patient Care Team: Atilano Deward ORN, MD as PCP - General (Cardiology) Debera Jayson MATSU, MD as PCP - Cardiology (Cardiology)   ASSESSMENT & PLAN:   Assessment:  1.  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 Castaneda - EGD (10/05/2022): Nodular mucosa in the second part of the duodenum, measured close to 1 cm. - Pathology: Well-differentiated neuroendocrine tumor, present in submucosal margin, negative for mitotic activity (Ki 67 <1%).  Tumor invades the lamina propria and diffusely involves muscularis propria extending into the submucosa. - EGD (01/02/2023): 15 mm submucosal nodule found in the first part of the duodenum. - Pathology: Well-differentiated neuroendocrine tumor, WHO grade 1, negative for mitotic activity (Ki-67 <1%).  Tumor is diffusely and strongly positive for neuroendocrine marker synaptophysin and shows positivity for chromogranin. - CTAP (12/06/2023): Colonic diverticulosis. - EGD (02/15/2024): 1.5 cm Sub mucosal nodule in the second portion of the duodenum.  Size appears to be similar but surface appears to be eroded. - Dotatate PET scan (03/18/2024): Focal activity in the second portion of duodenum, SUV 19.7.  No clear lesion on CT.  Moderate activity in the tail of the pancreas with SUV 12.4 with no clear lesion on the CT scan. - 24-hour urine 5-HIAA: Normal. - Lanreotide was recommended as the duodenal lesion was appearing eroded and he is not a surgical candidate. - Lanreotide every 4 weeks started on 03/27/2024  2.  Social/family history: - He lives at home with his wife.  He is independent of ADLs and ADLs.  Worked for [employer] and [employer] in the past.  Non-smoker. - Mother had colon cancer.  Plan:  1.  Well-differentiated carcinoid tumor of the second part of the  duodenum: - Lanreotide monthly injection was started on 03/27/2024.  He has tolerated it very well. - Labs today: Creatinine 1.43 and normal LFTs.  CBC grossly normal. - We reviewed MRI of the abdomen from 03/29/2024: No MR correlate for abnormal PET activity in the descending duodenum or pancreas. - He will continue lanreotide monthly.  RTC 3 months for follow-up with repeat labs. - He will have repeat EGD done by Dr. Eartha in the future.  If the duodenal lesion is completely resolved, we may stop lanreotide.  2.  Vitamin B12 deficiency: - Continue B12 1 mg tablet daily.  3.  Thrombocytopenia: - Previous workup for thrombocytopenia was negative.  Thought to be secondary to Entyvio . - Last few platelet counts have been remaining normal.   Orders Placed This Encounter  Procedures   CBC with Differential    Standing Status:   Future    Expected Date:   07/21/2024    Expiration Date:   10/19/2024   Comprehensive metabolic panel    Standing Status:   Future    Expected Date:   07/21/2024    Expiration Date:   10/19/2024   Chromogranin A    Standing Status:   Future    Expected Date:   07/21/2024    Expiration Date:   10/19/2024   [facility] COMMUNICATION INJECTION    Schedule 1 hour injection appointment   Treatment conditions    Provider to add parameters.    Standing Status:   Standing    Number of Occurrences:   1    Did you provide treatment parameters in  the comments section?:   No      I,Helena R Teague,acting as a scribe for Alean Stands, MD.,have documented all relevant documentation on the behalf of Alean Stands, MD,as directed by  Alean Stands, MD while in the presence of Alean Stands, MD.  I, Alean Stands MD, have reviewed the above documentation for accuracy and completeness, and I agree with the above.     Alean Stands, MD   7/10/20254:53 PM  CHIEF COMPLAINT/PURPOSE OF CONSULT:   Diagnosis: Duodenal carcinoid   Cancer  Staging  Duodenal carcinoid syndrome Staging form: Small Intestine - Other Histologies, AJCC 8th Edition - Clinical stage from 02/25/2024: cT3, cN0, cM0 - Unsigned    Prior Therapy: None  Current Therapy: Under workup   HISTORY OF PRESENT ILLNESS:   Oncology History   No history exists.      [name] is a 89 y.o. male presenting to clinic today for evaluation of malignant GIST of small bowel at the request of Eartha Angelia Sieving, MD.  Patient has a medical history of duodenal carcinoid, small bowel Crohn's disease s/p resection, C. Diff colitis, BPH, CAD, A-fib, chronic diastolic heart failure, GERD, hypothyroidism, anemia and thrombocytopenia.   [name] underwent an EGD on 02/15/24 for follow-up of duodenal NET with Dr. Eartha. A 15 mm protruding, sessile, submucosa nodule was found in the second portion of the duodenum.   Today, he states that he is doing well overall. His appetite level is at 100%. His energy level is at 50%.  INTERVAL HISTORY:   [name] is a 89 y.o. male presenting to the clinic today for follow-up of duodenal carcinoid. He was last seen by me on 03/24/24.  Today, he states that he is doing well overall. His appetite level is at 100%. His energy level is at 70%. [name] is accompanied by his wife.   He is tolerating lanreotide injections well and denies any side effects. He does not have follow-up endoscopy scheduled with GI. [place] continues to have Entyvio  injections.   PAST MEDICAL HISTORY:   Past Medical History: Past Medical History:  Diagnosis Date   Anemia    Atrial fibrillation (HCC)    BPH (benign prostatic hyperplasia)    CAD (coronary artery disease)    Catheterization 2004, mild/moderate nonobstructive disease  /   nuclear, 2007, small inferior scar // no ischemia   Crohn's disease (HCC)    Elevated PSA    GERD (gastroesophageal reflux disease)    History of kidney stones    History of pneumonia    Hypothyroidism    Mitral regurgitation     Pneumonia    SBO (small bowel obstruction) (HCC)    Urinary retention    Wears glasses     Surgical History: Past Surgical History:  Procedure Laterality Date   AGILE CAPSULE  10/18/2011   Procedure: AGILE CAPSULE;  Surgeon: Claudis RAYMOND Rivet, MD;  [facility]: [facility];  Service: Endoscopy;  Laterality: N/A;  730   BIOPSY  10/05/2022   Procedure: BIOPSY;  Surgeon: Eartha Angelia Sieving, MD;  [facility]: [facility];  Service: Gastroenterology;;   BIOPSY  01/02/2023   Procedure: BIOPSY;  Surgeon: Eartha Angelia Sieving, MD;  [facility]: [facility];  Service: Gastroenterology;;   CARDIAC CATHETERIZATION  2004   CATARACT EXTRACTION W/PHACO Right 06/27/2021   Procedure: CATARACT EXTRACTION PHACO AND INTRAOCULAR LENS PLACEMENT (IOC);  Surgeon: Harrie Agent, MD;  Location: [facility];  Service: Ophthalmology;  Laterality: Right;  CDE 21.98   CATARACT  EXTRACTION W/PHACO Left 07/11/2021   Procedure: CATARACT EXTRACTION PHACO AND INTRAOCULAR LENS PLACEMENT LEFT EYE;  Surgeon: Harrie Agent, MD;  Location: [facility];  Service: Ophthalmology;  Laterality: Left;  CDE=10.42   CHOLECYSTECTOMY  2010   Dr. DeMason   COLONOSCOPY  2008   DeMason   COLONOSCOPY N/A 03/23/2016   Procedure: COLONOSCOPY;  Surgeon: Claudis RAYMOND Rivet, MD;  [facility];  Service: Endoscopy;  Laterality: N/A;  1:00   CYSTOSCOPY WITH INSERTION OF UROLIFT     ESOPHAGEAL BRUSHING  10/05/2022   Procedure: ESOPHAGEAL BRUSHING;  Surgeon: Eartha Angelia Sieving, MD;  [facility];  Service: Gastroenterology;;   ESOPHAGOGASTRODUODENOSCOPY  11/08/2011   Procedure: ESOPHAGOGASTRODUODENOSCOPY (EGD);  Surgeon: Claudis RAYMOND Rivet, MD;  [facility]: [facility];  Service: Endoscopy;  Laterality: N/A;  300   ESOPHAGOGASTRODUODENOSCOPY N/A 02/15/2024   Procedure: EGD (ESOPHAGOGASTRODUODENOSCOPY);  Surgeon: Eartha Angelia, Sieving, MD;  [facility]: [facility];  Service: Gastroenterology;  Laterality: N/A;  2:00PM;ASA 3    ESOPHAGOGASTRODUODENOSCOPY (EGD) WITH PROPOFOL  N/A 10/05/2022   Procedure: ESOPHAGOGASTRODUODENOSCOPY (EGD) WITH PROPOFOL ;  Surgeon: Eartha Angelia Sieving, MD;  [facility];  Service: Gastroenterology;  Laterality: N/A;   ESOPHAGOGASTRODUODENOSCOPY (EGD) WITH PROPOFOL  N/A 01/02/2023   Procedure: ESOPHAGOGASTRODUODENOSCOPY (EGD) WITH PROPOFOL ;  Surgeon: Eartha Angelia Sieving, MD;  [facility]: [facility];  Service: Gastroenterology;  Laterality: N/A;  1:30 pm, asa 3   HEMOSTASIS CLIP PLACEMENT  10/05/2022   Procedure: HEMOSTASIS CLIP PLACEMENT;  Surgeon: Eartha Angelia Sieving, MD;  [facility];  Service: Gastroenterology;;   POLYPECTOMY  03/23/2016   Procedure: POLYPECTOMY;  Surgeon: Claudis RAYMOND Rivet, MD;  [facility];  Service: Endoscopy;;  Cecal polyp removed via cold forceps recto-sigmoid polyp removed via cold snare   POLYPECTOMY  10/05/2022   Procedure: POLYPECTOMY;  Surgeon: Eartha Angelia Sieving, MD;  [facility];  Service: Gastroenterology;;   TRANSURETHRAL RESECTION OF PROSTATE N/A 04/29/2020   Procedure: TRANSURETHRAL RESECTION OF THE PROSTATE (TURP);  Surgeon: Matilda Senior, MD;  Location: [facility];  Service: Urology;  Laterality: N/A;  73 MINS    Social History: Social History   Socioeconomic History   Marital status: Married    Spouse name: Not on file   Number of children: Not on file   Years of education: Not on file   Highest education level: Not on file  Occupational History   Occupation: Retired-Hospital Radio broadcast assistant: RETIRED  Tobacco Use   Smoking status: Never    Passive exposure: Never   Smokeless tobacco: Never  Vaping Use   Vaping status: Never Used  Substance and Sexual Activity   Alcohol  use: No    Alcohol /week: 0.0 standard drinks of alcohol    Drug use: No   Sexual activity: Not on file  Other Topics Concern   Not on file  Social History Narrative   Married    Social Drivers of Health   Financial Resource Strain: Not on file  Food Insecurity: No Food Insecurity (02/27/2024)   Hunger Vital Sign    Worried About Running Out of Food in the Last Year: Never true    Ran Out of Food in the Last Year: Never true  Transportation Needs: No Transportation Needs (02/27/2024)   PRAPARE - Administrator, Civil Service (Medical): No    Lack of Transportation (Non-Medical): No  Physical Activity: Not on file  Stress: Not on file  Social Connections: Socially Integrated (  02/27/2024)   Social Connection and Isolation Panel    Frequency of Communication with Friends and Family: More than three times a week    Frequency of Social Gatherings with Friends and Family: More than three times a week    Attends Religious Services: More than 4 times per year    Active Member of [employer] or Organizations: Yes    Attends Engineer, structural: More than 4 times per year    Marital Status: Married  Catering manager Violence: Not At Risk (02/27/2024)   Humiliation, Afraid, Rape, and Kick questionnaire    Fear of Current or Ex-Partner: No    Emotionally Abused: No    Physically Abused: No    Sexually Abused: No    Family History: Family History  Problem Relation Age of Onset   Colon cancer Mother    Heart disease Father    Stroke Brother    Healthy Daughter     Current Medications:  Current Outpatient Medications:    acetaminophen  (TYLENOL ) 500 MG tablet, Take 1,000 mg by mouth every 6 (six) hours as needed for mild pain (pain score 1-3) or moderate pain (pain score 4-6)., Disp: , Rfl:    CALCIUM  PO, Take 300 mg by mouth in the morning and at bedtime., Disp: , Rfl:    Cholecalciferol (VITAMIN D3) 50 MCG (2000 UT) TABS, Take 2,000 Units by mouth daily., Disp: , Rfl:    cyanocobalamin  (VITAMIN B12) 1000 MCG tablet, Take 1,000 mcg by mouth daily., Disp: , Rfl:    diltiazem  (CARDIZEM  CD) 360 MG 24 hr capsule, Take 1 capsule (360 mg total) by mouth  daily., Disp: 30 capsule, Rfl: 1   levothyroxine  (SYNTHROID , LEVOTHROID) 75 MCG tablet, Take 75 mcg by mouth daily before breakfast., Disp: , Rfl:    magnesium  oxide (MAG-OX) 400 (240 Mg) MG tablet, Take 400 mg by mouth 2 (two) times daily., Disp: , Rfl:    Melatonin 10 MG TABS, Take 10 mg by mouth at bedtime., Disp: , Rfl:    metoprolol  succinate (TOPROL -XL) 50 MG 24 hr tablet, Take 1 tablet (50 mg total) by mouth daily., Disp: 30 tablet, Rfl: 1   nitroGLYCERIN  (NITROSTAT ) 0.4 MG SL tablet, Place 0.4 mg under the tongue every 5 (five) minutes as needed. For chest pains. May repeat for up to 3 doses., Disp: , Rfl:    omeprazole  (PRILOSEC) 40 MG capsule, TAKE ONE CAPSULE BY MOUTH EVERY DAY, Disp: 90 capsule, Rfl: 1   Probiotic Product (ALIGN) 4 MG CAPS, Take 4 mg by mouth in the morning., Disp: , Rfl:    traZODone  (DESYREL ) 50 MG tablet, Take 50 mg by mouth at bedtime., Disp: , Rfl:    Vedolizumab  (ENTYVIO  IV), Inject into the vein. Every 8 weeks., Disp: , Rfl:    warfarin (COUMADIN ) 2.5 MG tablet, TAKE 1 TO 1 & 1/2 TABLETS BY MOUTH DAILY OR AS DIRECTED by coumadin  clinic, Disp: 45 tablet, Rfl: 5   Allergies: No Known Allergies  REVIEW OF SYSTEMS:   Review of Systems  Constitutional:  Negative for chills, fatigue and fever.  HENT:   Negative for lump/mass, mouth sores, nosebleeds, sore throat and trouble swallowing.   Eyes:  Negative for eye problems.  Respiratory:  Negative for cough and shortness of breath.   Cardiovascular:  Negative for chest pain, leg swelling and palpitations.  Gastrointestinal:  Positive for abdominal pain (right side, 5/10 severity). Negative for constipation, diarrhea, nausea and vomiting.  Genitourinary:  Negative for bladder incontinence,  difficulty urinating, dysuria, frequency, hematuria and nocturia.   Musculoskeletal:  Negative for arthralgias, back pain, flank pain, myalgias and neck pain.  Skin:  Negative for itching and rash.  Neurological:  Positive for  headaches (occasional). Negative for dizziness and numbness.  Hematological:  Does not bruise/bleed easily.  Psychiatric/Behavioral:  Negative for depression, sleep disturbance and suicidal ideas. The patient is not nervous/anxious.   All other systems reviewed and are negative.    VITALS:   Blood pressure 132/76, pulse 74, temperature (!) 97.5 F (36.4 C), temperature source Tympanic, resp. rate 18, height 5' 8 (1.727 m), weight 154 lb (69.9 kg), SpO2 99%.  Wt Readings from Last 3 Encounters:  04/24/24 154 lb (69.9 kg)  03/24/24 154 lb 5.2 oz (70 kg)  03/20/24 153 lb 4.8 oz (69.5 kg)    Body mass index is 23.42 kg/m.  Performance status (ECOG): 1 - Symptomatic but completely ambulatory  PHYSICAL EXAM:   Physical Exam Vitals and nursing note reviewed. Exam conducted with a chaperone present.  Constitutional:      Appearance: Normal appearance.  Cardiovascular:     Rate and Rhythm: Normal rate and regular rhythm.     Pulses: Normal pulses.     Heart sounds: Normal heart sounds.  Pulmonary:     Effort: Pulmonary effort is normal.     Breath sounds: Normal breath sounds.  Abdominal:     Palpations: Abdomen is soft. There is no hepatomegaly, splenomegaly or mass.     Tenderness: There is no abdominal tenderness.  Musculoskeletal:     Right lower leg: No edema.     Left lower leg: No edema.  Lymphadenopathy:     Cervical: No cervical adenopathy.     Right cervical: No superficial, deep or posterior cervical adenopathy.    Left cervical: No superficial, deep or posterior cervical adenopathy.     Upper Body:     Right upper body: No supraclavicular or axillary adenopathy.     Left upper body: No supraclavicular or axillary adenopathy.  Neurological:     General: No focal deficit present.     Mental Status: He is alert and oriented to person, place, and time.  Psychiatric:        Mood and Affect: Mood normal.        Behavior: Behavior normal.     LABS:   CBC     Component Value Date/Time   WBC 8.1 04/24/2024 0924   RBC 4.90 04/24/2024 0924   HGB 14.9 04/24/2024 0924   HGB 12.9 (L) 05/03/2023 0844   HCT 44.8 04/24/2024 0924   HCT 38.7 05/03/2023 0844   PLT 207 04/24/2024 0924   PLT 67 (LL) 05/09/2023 0858   MCV 91.4 04/24/2024 0924   MCV 91 05/03/2023 0844   MCH 30.4 04/24/2024 0924   MCHC 33.3 04/24/2024 0924   RDW 13.6 04/24/2024 0924   RDW 14.2 05/03/2023 0844   LYMPHSABS 1.7 04/24/2024 0924   LYMPHSABS 2.1 05/03/2023 0844   MONOABS 0.4 04/24/2024 0924   EOSABS 0.1 04/24/2024 0924   EOSABS 0.2 05/03/2023 0844   BASOSABS 0.1 04/24/2024 0924   BASOSABS 0.1 05/03/2023 0844    CMP    Component Value Date/Time   NA 138 04/24/2024 0924   K 3.9 04/24/2024 0924   CL 101 04/24/2024 0924   CO2 23 04/24/2024 0924   GLUCOSE 178 (H) 04/24/2024 0924   BUN 17 04/24/2024 0924   CREATININE 1.43 (H) 04/24/2024 0924   CREATININE 1.76 (  H) 04/03/2023 0828   CALCIUM  9.3 04/24/2024 0924   PROT 7.7 04/24/2024 0924   ALBUMIN 4.0 04/24/2024 0924   AST 26 04/24/2024 0924   ALT 21 04/24/2024 0924   ALKPHOS 87 04/24/2024 0924   BILITOT 0.8 04/24/2024 0924   GFRNONAA 47 (L) 04/24/2024 0924     No results found for: CEA1, CEA / No results found for: CEA1, CEA No results found for: PSA1 No results found for: CAN199 No results found for: RJW874  Lab Results  Component Value Date   TOTALPROTELP 7.1 05/22/2023   TOTALPROTELP 7.5 05/22/2023   ALBUMINELP 4.0 05/22/2023   A1GS 0.2 05/22/2023   A2GS 0.7 05/22/2023   BETS 1.0 05/22/2023   GAMS 1.1 05/22/2023   MSPIKE Not Observed 05/22/2023   SPEI Comment 05/22/2023   Lab Results  Component Value Date   TIBC 349 05/22/2023   FERRITIN 96 05/22/2023   IRONPCTSAT 23 05/22/2023   Lab Results  Component Value Date   LDH 145 05/22/2023     STUDIES:   MR ENTERO PELVIS W WO CONTRAST Result Date: 03/29/2024 CLINICAL DATA:  History of small-bowel Crohn's disease, small-bowel  adhesions, recent obstruction, recent diagnosis duodenal neuroendocrine tumor EXAM: MR ABDOMEN AND PELVIS WITHOUT AND WITH CONTRAST (MR ENTEROGRAPHY) TECHNIQUE: Multiplanar, multisequence MRI of the abdomen and pelvis was performed both before and during bolus administration of intravenous contrast. Negative oral contrast VoLumen was given. CONTRAST:  7mL GADAVIST  GADOBUTROL  1 MMOL/ML IV SOLN COMPARISON:  None Available. FINDINGS: COMBINED FINDINGS FOR BOTH MR ABDOMEN AND PELVIS Lower chest: No acute abnormality.  Small hiatal hernia. Hepatobiliary: No focal liver abnormality is seen. Status post cholecystectomy. No biliary dilatation. Pancreas: Unremarkable. No pancreatic ductal dilatation or surrounding inflammatory changes. Spleen: Normal in size without significant abnormality. Adrenals/Urinary Tract: Adrenal glands are unremarkable. Kidneys are normal, without renal calculi, solid lesion, or hydronephrosis. Trabeculated urinary bladder, consistent with chronic bladder outlet obstruction. Stomach/Bowel: Stomach is within normal limits. No MR correlate for abnormal PET Dotatate avidity in the descending duodenal (series 4, image 22, series 7, image 19). Appendix appears normal. Specifically, no evidence of obstruction in the vicinity of a small bowel anastomosis in the ventral right hemiabdomen (series 7, image 71). No inflammatory findings or abnormal mucosal contrast enhancement. Colonic diverticulosis, particularly involving the descending and sigmoid colon. Vascular/Lymphatic: Aortic atherosclerosis. No enlarged abdominal or pelvic lymph nodes. Reproductive: No mass or other significant abnormality. Other: Small fat containing left inguinal hernia (series 7, image 54). No ascites. Musculoskeletal: No acute or significant osseous findings. IMPRESSION: 1. No MR correlate for abnormal PET Dotatate avidity in the descending duodenum. 2. No evidence of obstruction in the vicinity of a small bowel anastomosis in  the ventral right hemiabdomen. No inflammatory findings or abnormal mucosal contrast enhancement. 3. Colonic diverticulosis, particularly involving the descending and sigmoid colon. No evidence of acute diverticulitis. 4. Trabeculated urinary bladder, consistent with chronic bladder outlet obstruction. 5. Small hiatal hernia. Electronically Signed   By: Marolyn JONETTA Jaksch M.D.   On: 03/29/2024 21:22   MR ENTERO ABDOMEN W WO CONTRAST Result Date: 03/29/2024 CLINICAL DATA:  History of small-bowel Crohn's disease, small-bowel adhesions, recent obstruction, recent diagnosis duodenal neuroendocrine tumor EXAM: MR ABDOMEN AND PELVIS WITHOUT AND WITH CONTRAST (MR ENTEROGRAPHY) TECHNIQUE: Multiplanar, multisequence MRI of the abdomen and pelvis was performed both before and during bolus administration of intravenous contrast. Negative oral contrast VoLumen was given. CONTRAST:  7mL GADAVIST  GADOBUTROL  1 MMOL/ML IV SOLN COMPARISON:  None Available. FINDINGS: COMBINED FINDINGS  FOR BOTH MR ABDOMEN AND PELVIS Lower chest: No acute abnormality.  Small hiatal hernia. Hepatobiliary: No focal liver abnormality is seen. Status post cholecystectomy. No biliary dilatation. Pancreas: Unremarkable. No pancreatic ductal dilatation or surrounding inflammatory changes. Spleen: Normal in size without significant abnormality. Adrenals/Urinary Tract: Adrenal glands are unremarkable. Kidneys are normal, without renal calculi, solid lesion, or hydronephrosis. Trabeculated urinary bladder, consistent with chronic bladder outlet obstruction. Stomach/Bowel: Stomach is within normal limits. No MR correlate for abnormal PET Dotatate avidity in the descending duodenal (series 4, image 22, series 7, image 19). Appendix appears normal. Specifically, no evidence of obstruction in the vicinity of a small bowel anastomosis in the ventral right hemiabdomen (series 7, image 71). No inflammatory findings or abnormal mucosal contrast enhancement. Colonic  diverticulosis, particularly involving the descending and sigmoid colon. Vascular/Lymphatic: Aortic atherosclerosis. No enlarged abdominal or pelvic lymph nodes. Reproductive: No mass or other significant abnormality. Other: Small fat containing left inguinal hernia (series 7, image 54). No ascites. Musculoskeletal: No acute or significant osseous findings. IMPRESSION: 1. No MR correlate for abnormal PET Dotatate avidity in the descending duodenum. 2. No evidence of obstruction in the vicinity of a small bowel anastomosis in the ventral right hemiabdomen. No inflammatory findings or abnormal mucosal contrast enhancement. 3. Colonic diverticulosis, particularly involving the descending and sigmoid colon. No evidence of acute diverticulitis. 4. Trabeculated urinary bladder, consistent with chronic bladder outlet obstruction. 5. Small hiatal hernia. Electronically Signed   By: Marolyn JONETTA Jaksch M.D.   On: 03/29/2024 21:22

## 2024-04-24 NOTE — Patient Instructions (Addendum)
 [facility] at [facility] Discharge Instructions   You were seen and examined today by Dr. Ellin Saba.  He reviewed the results of your lab work which are normal/stable.   We will proceed with your injection today.   Return as scheduled.      Thank you for choosing [facility] at [facility] to provide your oncology and hematology care.  To afford each patient quality time with our provider, please arrive at least 15 minutes before your scheduled appointment time.   If you have a lab appointment with the Cancer Center please come in thru the Main Entrance and check in at the main information desk.  You need to re-schedule your appointment should you arrive 10 or more minutes late.  We strive to give you quality time with our providers, and arriving late affects you and other patients whose appointments are after yours.  Also, if you no show three or more times for appointments you may be dismissed from the clinic at the providers discretion.     Again, thank you for choosing [facility].  Our hope is that these requests will decrease the amount of time that you wait before being seen by our physicians.       _____________________________________________________________  Should you have questions after your visit to [facility], please contact our office at [phone] and follow the prompts.  Our office hours are 8:00 a.m. and 4:30 p.m. Monday - Friday.  Please note that voicemails left after 4:00 p.m. may not be returned until the following business day.  We are closed weekends and major holidays.  You do have access to a nurse 24-7, just call the main number to the clinic [phone] and do not press any options, hold on the line and a nurse will answer the phone.    For prescription refill requests, have your pharmacy contact our office and allow 72 hours.    Due to Covid, you will need to wear a mask upon  entering the hospital. If you do not have a mask, a mask will be given to you at the Main Entrance upon arrival. For doctor visits, patients may have 1 support person age 24 or older with them. For treatment visits, patients can not have anyone with them due to social distancing guidelines and our immunocompromised population.

## 2024-04-24 NOTE — Patient Instructions (Signed)
 [facility] [place] - A DEPT OF [facility]  Discharge Instructions: Thank you for choosing [facility] to provide your oncology and hematology care.  If you have a lab appointment with the Cancer Center - please note that after April 8th, 2024, all labs will be drawn in the cancer center.  You do not have to check in or register with the main entrance as you have in the past but will complete your check-in in the cancer center.  Wear comfortable clothing and clothing appropriate for easy access to any Portacath or PICC line.   We strive to give you quality time with your provider. You may need to reschedule your appointment if you arrive late (15 or more minutes).  Arriving late affects you and other patients whose appointments are after yours.  Also, if you miss three or more appointments without notifying the office, you may be dismissed from the clinic at the provider's discretion.      For prescription refill requests, have your pharmacy contact our office and allow 72 hours for refills to be completed.    Today you received the following chemotherapy and/or immunotherapy agents Lanreotide.  Lanreotide Injection What is this medication? LANREOTIDE (lan REE oh tide) treats high levels of growth hormone (acromegaly). It is used when other therapies have not worked well enough or cannot be tolerated. It works by reducing the amount of growth hormone your body makes. This reduces symptoms and the risk of health problems caused by too much growth hormone, such as diabetes and heart disease. It may also be used to treat neuroendocrine tumors, a cancer of the cells that release hormones and other substances in your body. It works by slowing down the release of these substances from the cells. This slows tumor growth. It also decreases the symptoms of carcinoid syndrome, such as flushing or diarrhea. This medicine may be used for other purposes; ask your health care  provider or pharmacist if you have questions. COMMON BRAND NAME(S): Somatuline Depot What should I tell my care team before I take this medication? They need to know if you have any of these conditions: Diabetes Gallbladder disease Heart disease Kidney disease Liver disease Pancreatic disease Thyroid disease An unusual or allergic reaction to lanreotide, other medications, foods, dyes, or preservatives Pregnant or trying to get pregnant Breastfeeding How should I use this medication? This medication is injected under the skin. It is given by your care team in a hospital or clinic setting. Talk to your care team about the use of this medication in children. Special care may be needed. Overdosage: If you think you have taken too much of this medicine contact a poison control center or emergency room at once. NOTE: This medicine is only for you. Do not share this medicine with others. What if I miss a dose? Keep appointments for follow-up doses. It is important not to miss your dose. Call your care team if you are unable to keep an appointment. What may interact with this medication? Bromocriptine Cyclosporine Certain medications for blood pressure, heart disease, irregular heartbeat Certain medications for diabetes Quinidine Terfenadine This list may not describe all possible interactions. Give your health care provider a list of all the medicines, herbs, non-prescription drugs, or dietary supplements you use. Also tell them if you smoke, drink alcohol, or use illegal drugs. Some items may interact with your medicine. What should I watch for while using this medication? Visit your care team  for regular checks on your progress. Tell your care team if your symptoms do not start to get better or if they get worse. Your condition will be monitored carefully while you are receiving this medication. You may need blood work while you are taking this medication. This medication may increase  blood sugar. The risk may be higher in patients who already have diabetes. Ask your care team what you can do to lower your risk of diabetes while taking this medication. Talk to your care team if you wish to become pregnant or think you may be pregnant. This medication can cause serious birth defects. Do not breast-feed while taking this medication and for 6 months after stopping therapy. This medication may cause infertility. Talk to your care team if you are concerned about your fertility. What side effects may I notice from receiving this medication? Side effects that you should report to your care team as soon as possible: Allergic reactions--skin rash, itching, hives, swelling of the face, lips, tongue, or throat Gallbladder problems--severe stomach pain, nausea, vomiting, fever High blood sugar (hyperglycemia)--increased thirst or amount of urine, unusual weakness or fatigue, blurry vision Increase in blood pressure Low blood sugar (hypoglycemia)--pale, blue or purple skin or lips, sweating, fussiness, rapid heartbeat, poor feeding, low body temperature Low thyroid levels (hypothyroidism)--unusual weakness or fatigue, increased sensitivity to cold, constipation, hair loss, dry skin, weight gain, feelings of depression Oily or light-colored stools, diarrhea, bloating, weight loss Slow heartbeat--dizziness, feeling faint or lightheaded, confusion, trouble breathing, unusual weakness or fatigue Side effects that usually do not require medical attention (report these to your care team if they continue or are bothersome): Diarrhea Dizziness Headache Muscle spasms Nausea Pain, redness, or irritation at injection site Stomach pain This list may not describe all possible side effects. Call your doctor for medical advice about side effects. You may report side effects to FDA at 1-800-FDA-1088. Where should I keep my medication? This medication is given in a hospital or clinic. It will not be  stored at home. NOTE: This sheet is a summary. It may not cover all possible information. If you have questions about this medicine, talk to your doctor, pharmacist, or health care provider.  2024 Elsevier/Gold Standard (2023-09-14 00:00:00)       To help prevent nausea and vomiting after your treatment, we encourage you to take your nausea medication as directed.  BELOW ARE SYMPTOMS THAT SHOULD BE REPORTED IMMEDIATELY: *FEVER GREATER THAN 100.4 F (38 C) OR HIGHER *CHILLS OR SWEATING *NAUSEA AND VOMITING THAT IS NOT CONTROLLED WITH YOUR NAUSEA MEDICATION *UNUSUAL SHORTNESS OF BREATH *UNUSUAL BRUISING OR BLEEDING *URINARY PROBLEMS (pain or burning when urinating, or frequent urination) *BOWEL PROBLEMS (unusual diarrhea, constipation, pain near the anus) TENDERNESS IN MOUTH AND THROAT WITH OR WITHOUT PRESENCE OF ULCERS (sore throat, sores in mouth, or a toothache) UNUSUAL RASH, SWELLING OR PAIN  UNUSUAL VAGINAL DISCHARGE OR ITCHING   Items with * indicate a potential emergency and should be followed up as soon as possible or go to the Emergency Department if any problems should occur.  Please show the CHEMOTHERAPY ALERT CARD or IMMUNOTHERAPY ALERT CARD at check-in to the Emergency Department and triage nurse.  Should you have questions after your visit or need to cancel or reschedule your appointment, please contact [facility]  - A DEPT OF [facility] [phone]  and follow the prompts.  Office hours are 8:00 a.m. to 4:30 p.m. Monday - Friday. Please note that voicemails  left after 4:00 p.m. may not be returned until the following business day.  We are closed weekends and major holidays. You have access to a nurse at all times for urgent questions. Please call the main number to the clinic [phone] and follow the prompts.  For any non-urgent questions, you may also contact your provider using MyChart. We now offer e-Visits for anyone 63 and older to  request care online for non-urgent symptoms. For details visit [email or web address].   Also download the MyChart app! Go to the app store, search "MyChart", open the app, select [place], and log in with your MyChart username and password.

## 2024-04-25 LAB — CHROMOGRANIN A: Chromogranin A (ng/mL): 215.4 ng/mL — ABNORMAL HIGH (ref 0.0–101.8)

## 2024-04-28 ENCOUNTER — Other Ambulatory Visit (INDEPENDENT_AMBULATORY_CARE_PROVIDER_SITE_OTHER): Payer: Self-pay

## 2024-04-28 DIAGNOSIS — K50012 Crohn's disease of small intestine with intestinal obstruction: Secondary | ICD-10-CM

## 2024-04-29 ENCOUNTER — Encounter: Admitting: Emergency Medicine

## 2024-04-29 ENCOUNTER — Other Ambulatory Visit (HOSPITAL_COMMUNITY)
Admission: RE | Admit: 2024-04-29 | Discharge: 2024-04-29 | Disposition: A | Discharge status: Home / Self Care | Source: Other Acute Inpatient Hospital | Attending: Gastroenterology | Admitting: Gastroenterology

## 2024-04-29 VITALS — BP 171/84 | HR 51 | Temp 97.9°F | Resp 16

## 2024-04-29 DIAGNOSIS — K50018 Crohn's disease of small intestine with other complication: Secondary | ICD-10-CM

## 2024-04-29 DIAGNOSIS — K50012 Crohn's disease of small intestine with intestinal obstruction: Secondary | ICD-10-CM | POA: Diagnosis not present

## 2024-04-29 MED ORDER — VEDOLIZUMAB 300 MG IV SOLR
300.0000 mg | Freq: Once | INTRAVENOUS | Status: AC
  Administered 2024-04-29: 300 mg via INTRAVENOUS
  Filled 2024-04-29: qty 5

## 2024-04-29 NOTE — Progress Notes (Signed)
 Diagnosis: Crohn's Disease  Provider:  Mariette Cree NP  Procedure: IV Infusion  IV Type: Peripheral, IV Location: L Antecubital  Entyvio  (Vedolizumab ), Dose: 300 mg  Infusion Start Time: 1338  Infusion Stop Time: 1410  Post Infusion IV Care: Observation period completed  Discharge: Condition: Good, Destination: Home . AVS Provided  Performed by:  Vernell JINNY Fitting, RN

## 2024-05-02 ENCOUNTER — Encounter (INDEPENDENT_AMBULATORY_CARE_PROVIDER_SITE_OTHER): Payer: Self-pay | Admitting: Gastroenterology

## 2024-05-03 ENCOUNTER — Other Ambulatory Visit: Payer: Self-pay

## 2024-05-03 ENCOUNTER — Emergency Department (HOSPITAL_COMMUNITY)
Admission: EM | Admit: 2024-05-03 | Discharge: 2024-05-03 | Disposition: A | Discharge status: Home / Self Care | Attending: Emergency Medicine | Admitting: Emergency Medicine

## 2024-05-03 ENCOUNTER — Emergency Department (HOSPITAL_COMMUNITY)

## 2024-05-03 ENCOUNTER — Encounter (HOSPITAL_COMMUNITY): Payer: Self-pay | Admitting: *Deleted

## 2024-05-03 DIAGNOSIS — K4090 Unilateral inguinal hernia, without obstruction or gangrene, not specified as recurrent: Secondary | ICD-10-CM | POA: Diagnosis not present

## 2024-05-03 DIAGNOSIS — R9431 Abnormal electrocardiogram [ECG] [EKG]: Secondary | ICD-10-CM | POA: Diagnosis not present

## 2024-05-03 DIAGNOSIS — Z7901 Long term (current) use of anticoagulants: Secondary | ICD-10-CM | POA: Diagnosis not present

## 2024-05-03 DIAGNOSIS — D72829 Elevated white blood cell count, unspecified: Secondary | ICD-10-CM | POA: Diagnosis not present

## 2024-05-03 DIAGNOSIS — M47812 Spondylosis without myelopathy or radiculopathy, cervical region: Secondary | ICD-10-CM | POA: Diagnosis not present

## 2024-05-03 DIAGNOSIS — I700 Atherosclerosis of aorta: Secondary | ICD-10-CM | POA: Diagnosis not present

## 2024-05-03 DIAGNOSIS — R270 Ataxia, unspecified: Secondary | ICD-10-CM | POA: Diagnosis not present

## 2024-05-03 DIAGNOSIS — I6782 Cerebral ischemia: Secondary | ICD-10-CM | POA: Diagnosis not present

## 2024-05-03 DIAGNOSIS — K449 Diaphragmatic hernia without obstruction or gangrene: Secondary | ICD-10-CM | POA: Diagnosis not present

## 2024-05-03 DIAGNOSIS — M549 Dorsalgia, unspecified: Secondary | ICD-10-CM | POA: Insufficient documentation

## 2024-05-03 DIAGNOSIS — R109 Unspecified abdominal pain: Secondary | ICD-10-CM | POA: Diagnosis not present

## 2024-05-03 DIAGNOSIS — M160 Bilateral primary osteoarthritis of hip: Secondary | ICD-10-CM | POA: Diagnosis not present

## 2024-05-03 DIAGNOSIS — W19XXXA Unspecified fall, initial encounter: Secondary | ICD-10-CM

## 2024-05-03 DIAGNOSIS — I672 Cerebral atherosclerosis: Secondary | ICD-10-CM | POA: Diagnosis not present

## 2024-05-03 DIAGNOSIS — S7001XA Contusion of right hip, initial encounter: Secondary | ICD-10-CM | POA: Diagnosis not present

## 2024-05-03 DIAGNOSIS — R6883 Chills (without fever): Secondary | ICD-10-CM | POA: Insufficient documentation

## 2024-05-03 DIAGNOSIS — M25551 Pain in right hip: Secondary | ICD-10-CM | POA: Diagnosis not present

## 2024-05-03 DIAGNOSIS — W1830XA Fall on same level, unspecified, initial encounter: Secondary | ICD-10-CM | POA: Insufficient documentation

## 2024-05-03 DIAGNOSIS — R110 Nausea: Secondary | ICD-10-CM | POA: Insufficient documentation

## 2024-05-03 DIAGNOSIS — S0990XA Unspecified injury of head, initial encounter: Secondary | ICD-10-CM | POA: Diagnosis not present

## 2024-05-03 LAB — COMPREHENSIVE METABOLIC PANEL WITH GFR
ALT: 19 U/L (ref 0–44)
AST: 27 U/L (ref 15–41)
Albumin: 4.2 g/dL (ref 3.5–5.0)
Alkaline Phosphatase: 88 U/L (ref 38–126)
Anion gap: 13 (ref 5–15)
BUN: 16 mg/dL (ref 8–23)
CO2: 25 mmol/L (ref 22–32)
Calcium: 9.1 mg/dL (ref 8.9–10.3)
Chloride: 101 mmol/L (ref 98–111)
Creatinine, Ser: 1.37 mg/dL — ABNORMAL HIGH (ref 0.61–1.24)
GFR, Estimated: 49 mL/min — ABNORMAL LOW (ref >60)
Glucose, Bld: 123 mg/dL — ABNORMAL HIGH (ref 70–99)
Potassium: 4.1 mmol/L (ref 3.5–5.1)
Sodium: 139 mmol/L (ref 135–145)
Total Bilirubin: 0.8 mg/dL (ref 0.0–1.2)
Total Protein: 7.5 g/dL (ref 6.5–8.1)

## 2024-05-03 LAB — URINALYSIS, ROUTINE W REFLEX MICROSCOPIC
Bacteria, UA: NONE SEEN
Bilirubin Urine: NEGATIVE
Glucose, UA: NEGATIVE mg/dL
Hgb urine dipstick: NEGATIVE
Ketones, ur: NEGATIVE mg/dL
Leukocytes,Ua: NEGATIVE
Nitrite: NEGATIVE
Protein, ur: 30 mg/dL — AB
Specific Gravity, Urine: 1.016 (ref 1.005–1.030)
pH: 6 (ref 5.0–8.0)

## 2024-05-03 LAB — CBC WITH DIFFERENTIAL/PLATELET
Abs Immature Granulocytes: 0.05 K/uL (ref 0.00–0.07)
Basophils Absolute: 0.1 K/uL (ref 0.0–0.1)
Basophils Relative: 1 %
Eosinophils Absolute: 0.1 K/uL (ref 0.0–0.5)
Eosinophils Relative: 1 %
HCT: 44.1 % (ref 39.0–52.0)
Hemoglobin: 15.2 g/dL (ref 13.0–17.0)
Immature Granulocytes: 0 %
Lymphocytes Relative: 15 %
Lymphs Abs: 1.6 K/uL (ref 0.7–4.0)
MCH: 30.6 pg (ref 26.0–34.0)
MCHC: 34.5 g/dL (ref 30.0–36.0)
MCV: 88.7 fL (ref 80.0–100.0)
Monocytes Absolute: 0.8 K/uL (ref 0.1–1.0)
Monocytes Relative: 7 %
Neutro Abs: 8.5 K/uL — ABNORMAL HIGH (ref 1.7–7.7)
Neutrophils Relative %: 76 %
Platelets: 214 K/uL (ref 150–400)
RBC: 4.97 MIL/uL (ref 4.22–5.81)
RDW: 13.8 % (ref 11.5–15.5)
WBC: 11.1 K/uL — ABNORMAL HIGH (ref 4.0–10.5)
nRBC: 0 % (ref 0.0–0.2)

## 2024-05-03 LAB — RESP PANEL BY RT-PCR (RSV, FLU A&B, COVID)  RVPGX2
Influenza A by PCR: NEGATIVE
Influenza B by PCR: NEGATIVE
Resp Syncytial Virus by PCR: NEGATIVE
SARS Coronavirus 2 by RT PCR: NEGATIVE

## 2024-05-03 LAB — PROTIME-INR
INR: 2.5 — ABNORMAL HIGH (ref 0.8–1.2)
Prothrombin Time: 28.2 s — ABNORMAL HIGH (ref 11.4–15.2)

## 2024-05-03 LAB — LACTIC ACID, PLASMA: Lactic Acid, Venous: 2 mmol/L (ref 0.5–1.9)

## 2024-05-03 MED ORDER — SODIUM CHLORIDE 0.9 % IV BOLUS: 1000.0000 mL | Freq: Once | INTRAVENOUS | Status: DC

## 2024-05-03 MED ORDER — IOHEXOL 300 MG/ML  SOLN
80.0000 mL | Freq: Once | INTRAMUSCULAR | Status: AC | PRN
  Administered 2024-05-03: 80 mL via INTRAVENOUS

## 2024-05-03 MED ORDER — SODIUM CHLORIDE 0.9 % IV BOLUS
500.0000 mL | Freq: Once | INTRAVENOUS | Status: AC
  Administered 2024-05-03: 500 mL via INTRAVENOUS

## 2024-05-03 NOTE — ED Triage Notes (Signed)
 Pt fell after pulling electric blanket cord and pt lost his balance causing him to fall backwards.  Denies LOC. Landed on bottom and lightly hit back of head on carpeted floor.  Occurred this morning at 1000.  + nausea, denies emesis. Pt is on blood thinner Warfarin.

## 2024-05-03 NOTE — Discharge Instructions (Addendum)
 Please follow-up closely with your primary care doctor on an outpatient basis.  Follow-up closely with the surgeon who performed your bowel resection for reevaluation as you do have a small stricture within your small bowel.  There is no signs of any obstruction at this time.  Please continue good oral hydration at home.  Return to the emergency department immediately for any new or worsening symptoms.

## 2024-05-03 NOTE — ED Notes (Signed)
 Patient taken to CT at this time.

## 2024-05-03 NOTE — ED Provider Notes (Signed)
 [place] EMERGENCY DEPARTMENT AT [facility] Provider Note   CSN: [id number] Arrival date & time: 05/03/24  1813     Patient presents with: [name] Denice is a 89 y.o. male.   Patient is a 89 year old male who presents emergency department the chief complaint of pain to his right hip, back.  He notes that he was pulling an electric cord at an electric blanket earlier this morning when he had a mechanical fall backwards onto his bottom and back.  He did strike his head at that time.  He is currently on warfarin at this time.  Patient does note that since the fall he has been experiencing chills as well as intermittent shaking.  He has had associated nausea without vomiting.  He does admit to some right-sided abdominal and flank pain.  He denies any associated chest pain, shortness of breath.  He denies any pain to his neck.  He denies any abnormal headaches.  There is been no associated dizziness, lightheadedness or syncope.  Patient denies any vomiting, diarrhea, dysuria or hematuria.   Fall Associated symptoms include abdominal pain.       Prior to Admission medications   Medication Sig Start Date End Date Taking? Authorizing Provider  acetaminophen  (TYLENOL ) 500 MG tablet Take 1,000 mg by mouth every 6 (six) hours as needed for mild pain (pain score 1-3) or moderate pain (pain score 4-6).    [provider]  CALCIUM  PO Take 300 mg by mouth in the morning and at bedtime.    [provider]  Cholecalciferol (VITAMIN D3) 50 MCG (2000 UT) TABS Take 2,000 Units by mouth daily.    [provider]  cyanocobalamin  (VITAMIN B12) 1000 MCG tablet Take 1,000 mcg by mouth daily.    [provider]  diltiazem  (CARDIZEM  CD) 360 MG 24 hr capsule Take 1 capsule (360 mg total) by mouth daily. 03/03/24   Evonnie Lenis, MD  levothyroxine  (SYNTHROID , LEVOTHROID) 75 MCG tablet Take 75 mcg by mouth daily before breakfast.    [provider]   magnesium  oxide (MAG-OX) 400 (240 Mg) MG tablet Take 400 mg by mouth 2 (two) times daily.    [provider]  Melatonin 10 MG TABS Take 10 mg by mouth at bedtime.    [provider]  metoprolol  succinate (TOPROL -XL) 50 MG 24 hr tablet Take 1 tablet (50 mg total) by mouth daily. 03/03/24   Evonnie Lenis, MD  nitroGLYCERIN  (NITROSTAT ) 0.4 MG SL tablet Place 0.4 mg under the tongue every 5 (five) minutes as needed. For chest pains. May repeat for up to 3 doses.    [provider]  omeprazole  (PRILOSEC) 40 MG capsule TAKE ONE CAPSULE BY MOUTH EVERY DAY 11/12/23   Eartha Flavors, Toribio, MD  Probiotic Product (ALIGN) 4 MG CAPS Take 4 mg by mouth in the morning.    [provider]  traZODone  (DESYREL ) 50 MG tablet Take 50 mg by mouth at bedtime.    [provider]  Vedolizumab  (ENTYVIO  IV) Inject into the vein. Every 8 weeks.    [provider]  warfarin (COUMADIN ) 2.5 MG tablet TAKE 1 TO 1 & 1/2 TABLETS BY MOUTH DAILY OR AS DIRECTED by coumadin  clinic 04/09/24   Debera Jayson MATSU, MD    Allergies: Patient has no known allergies.    Review of Systems  Constitutional:  Positive for chills.  Gastrointestinal:  Positive for abdominal pain.  Musculoskeletal:  Positive for back pain.  All other systems reviewed and are negative.   Updated Vital Signs BP (!) 158/79 (BP Location: Right Arm)   Pulse (!) 59   Temp 98.1 F (36.7 C)   Resp (!) 21   Ht 5' 8 (1.727 m)   Wt 69.9 kg   SpO2 97%   BMI 23.42 kg/m   Physical Exam Vitals and nursing note reviewed.  Constitutional:      General: He is not in acute distress.    Appearance: Normal appearance. He is not ill-appearing.  HENT:     Head: Normocephalic and atraumatic.     Nose: Nose normal.     Mouth/Throat:     Mouth: Mucous membranes are moist.  Eyes:     Extraocular Movements: Extraocular movements intact.     Conjunctiva/sclera: Conjunctivae normal.     Pupils: Pupils are equal,  round, and reactive to light.  Cardiovascular:     Rate and Rhythm: Normal rate and regular rhythm.     Pulses: Normal pulses.     Heart sounds: Normal heart sounds. No murmur heard.    No gallop.  Pulmonary:     Effort: Pulmonary effort is normal. No respiratory distress.     Breath sounds: Normal breath sounds. No stridor. No wheezing, rhonchi or rales.  Chest:     Chest wall: No tenderness.  Abdominal:     General: Abdomen is flat. Bowel sounds are normal. There is no distension.     Palpations: Abdomen is soft.     Tenderness: There is no guarding.     Comments: Tender to palpation along right side of abdomen and right flank  Musculoskeletal:        General: Normal range of motion.     Cervical back: Normal range of motion and neck supple. No rigidity or tenderness.     Comments: Tenderness palpation noted over right hip, nontender palpation remainder bilateral lower extremities, pelvis stable to AP and lateral compression, nontender to palpation over bilateral upper extremities, peripheral pulses 2+ throughout, cap refill less than 2 seconds distally, sensation intact distally, full range of motion noted throughout, no obvious deformity or bruising, no skin breakdown or ulceration, no lacerations or abrasions, tenderness palpation noted over thoracic spine with no step-off or deformity, nontender palpation of the lumbar spine  Skin:    General: Skin is warm and dry.     Findings: No bruising or rash.  Neurological:     General: No focal deficit present.     Mental Status: He is alert and oriented to person, place, and time. Mental status is at baseline.     Cranial Nerves: No cranial nerve deficit.     Sensory: No sensory deficit.     Motor: No weakness.     Coordination: Coordination normal.     Gait: Gait normal.  Psychiatric:        Mood and Affect: Mood normal.        Behavior: Behavior normal.        Thought Content: Thought content normal.        Judgment: Judgment  normal.     (all labs ordered are listed, but only abnormal results are displayed) Labs Reviewed  RESP PANEL BY RT-PCR (RSV, FLU A&B, COVID)  [id number]  COMPREHENSIVE METABOLIC PANEL WITH GFR  LACTIC ACID, PLASMA  LACTIC ACID, PLASMA  CBC WITH DIFFERENTIAL/PLATELET  URINALYSIS, ROUTINE W REFLEX MICROSCOPIC    EKG: None  Radiology: No results found.   Procedures  Medications Ordered in the ED - No data to display                                  Medical Decision Making Amount and/or Complexity of Data Reviewed Labs: ordered. Radiology: ordered.  Risk Prescription drug management.   This patient presents to the ED for concern of fall, back pain, hip pain, chills differential diagnosis includes long bone or joint fracture, vertebral fracture, intracranial hemorrhage, contusion, sprain, strain, intra-abdominal hemorrhage, urinary tract infection, viral syndrome    Additional history obtained:  Additional history obtained from family External records from outside source obtained and reviewed including medical records   Lab Tests:  I Ordered, and personally interpreted labs.  The pertinent results include: Mild leukocytosis but appears to be at baseline, no anemia, creatinine at baseline, normal liver function, normal electrolytes, unremarkable urinalysis, lactic acid of 2, INR within acceptable limits, negative viral swab   Imaging Studies ordered:  I ordered imaging studies including CT scan of head, cervical spine, thoracic spine, chest abdomen and pelvis, right hip x-ray I independently visualized and interpreted imaging which showed no acute intracranial hemorrhage, no vertebral fracture, no acute traumatic process within the abdomen and pelvis, anastomotic stricture noted within the small bowel with no signs of obstruction, no acute traumatic process of the right hip I agree with the radiologist interpretation   Medicines ordered and prescription drug  management:  I ordered medication including IV fluids for dehydration Reevaluation of the patient after these medicines showed that the patient improved I have reviewed the patients home medicines and have made adjustments as needed   Problem List / ED Course:  Patient is doing very well at this time and is stable for discharge home.  Patient is asymptomatic at this time with no acute complaints.  Did discuss continued treatment of his hip contusion at home.  All imaging demonstrated no signs of acute traumatic process.  Did discuss with patient and family the findings of the anastomotic stricture within the small bowel and did recommend close follow-up with the surgeon who performed the previous surgery.  They note that they will schedule an appointment.  He has no indication for bowel obstruction at this time.  Urinalysis demonstrates no indication of urinary tract infection.  He has a negative viral swab.  Blood work is otherwise unremarkable at this point.  Do not suspect that repeat lactic acid is warranted at this time.  Patient does not actively meet sepsis criteria at this point.  Close follow-up with PCP was discussed as well as strict turn precautions for any new or worsening symptoms.  Patient and family voiced understanding and had no additional questions.   Social Determinants of Health:  None        Final diagnoses:  None    ED Discharge Orders     None          Daralene Lonni [name] DEVONNA 05/03/24 2058    Suzette Pac, MD 05/06/24 1208

## 2024-05-05 ENCOUNTER — Telehealth (INDEPENDENT_AMBULATORY_CARE_PROVIDER_SITE_OTHER): Payer: Self-pay

## 2024-05-05 DIAGNOSIS — R5383 Other fatigue: Secondary | ICD-10-CM | POA: Diagnosis not present

## 2024-05-05 DIAGNOSIS — W19XXXA Unspecified fall, initial encounter: Secondary | ICD-10-CM | POA: Diagnosis not present

## 2024-05-05 DIAGNOSIS — Z6822 Body mass index (BMI) 22.0-22.9, adult: Secondary | ICD-10-CM | POA: Diagnosis not present

## 2024-05-05 LAB — MISC LABCORP TEST (SEND OUT): Labcorp test code: 504567

## 2024-05-05 NOTE — Telephone Encounter (Signed)
 I spoke with [name] (patient spouse) and made her aware per Dr.Castaneda, Regarding his fall, he should follow closely with his PCP. However, if he is not having any abdominal complaints at the moment, he can keep his current appointment unless he develops new symptoms. She states understanding.

## 2024-05-05 NOTE — Telephone Encounter (Signed)
 Regarding his fall, he should follow closely with his PCP.  However, if he is not having any abdominal complaints at the moment, he can keep his current appointment unless he develops new symptoms.

## 2024-05-05 NOTE — Telephone Encounter (Signed)
 Patient wife called today to report the patient fell last Saturday 05/03/2024 and was taken to the Ed.Patient just not feeling well since. Patient saw Dr. Atilano this am and advised that he increase the Tylenol  to two TID and rest. Per Dickey patient is not having any GI related issues but has an appointment scheduled here on 06/05/2024 at 9:50 am. They wanted to know if he should be seen any sooner here than that appointment date and time. Please advise.

## 2024-05-06 ENCOUNTER — Emergency Department (HOSPITAL_COMMUNITY)

## 2024-05-06 ENCOUNTER — Emergency Department (HOSPITAL_COMMUNITY)
Admission: EM | Admit: 2024-05-06 | Discharge: 2024-05-06 | Disposition: A | Discharge status: Home / Self Care | Attending: Emergency Medicine | Admitting: Emergency Medicine

## 2024-05-06 ENCOUNTER — Encounter (HOSPITAL_COMMUNITY): Payer: Self-pay | Admitting: *Deleted

## 2024-05-06 ENCOUNTER — Other Ambulatory Visit: Payer: Self-pay

## 2024-05-06 ENCOUNTER — Other Ambulatory Visit (INDEPENDENT_AMBULATORY_CARE_PROVIDER_SITE_OTHER): Payer: Self-pay | Admitting: Gastroenterology

## 2024-05-06 DIAGNOSIS — I2510 Atherosclerotic heart disease of native coronary artery without angina pectoris: Secondary | ICD-10-CM | POA: Insufficient documentation

## 2024-05-06 DIAGNOSIS — I672 Cerebral atherosclerosis: Secondary | ICD-10-CM | POA: Diagnosis not present

## 2024-05-06 DIAGNOSIS — I480 Paroxysmal atrial fibrillation: Secondary | ICD-10-CM | POA: Insufficient documentation

## 2024-05-06 DIAGNOSIS — K5730 Diverticulosis of large intestine without perforation or abscess without bleeding: Secondary | ICD-10-CM | POA: Diagnosis not present

## 2024-05-06 DIAGNOSIS — R0602 Shortness of breath: Secondary | ICD-10-CM | POA: Insufficient documentation

## 2024-05-06 DIAGNOSIS — S32009A Unspecified fracture of unspecified lumbar vertebra, initial encounter for closed fracture: Secondary | ICD-10-CM | POA: Diagnosis not present

## 2024-05-06 DIAGNOSIS — Z7901 Long term (current) use of anticoagulants: Secondary | ICD-10-CM | POA: Diagnosis not present

## 2024-05-06 DIAGNOSIS — K4090 Unilateral inguinal hernia, without obstruction or gangrene, not specified as recurrent: Secondary | ICD-10-CM | POA: Diagnosis not present

## 2024-05-06 DIAGNOSIS — S32010A Wedge compression fracture of first lumbar vertebra, initial encounter for closed fracture: Secondary | ICD-10-CM | POA: Diagnosis not present

## 2024-05-06 DIAGNOSIS — J90 Pleural effusion, not elsewhere classified: Secondary | ICD-10-CM | POA: Diagnosis not present

## 2024-05-06 DIAGNOSIS — N281 Cyst of kidney, acquired: Secondary | ICD-10-CM | POA: Diagnosis not present

## 2024-05-06 DIAGNOSIS — R519 Headache, unspecified: Secondary | ICD-10-CM | POA: Diagnosis not present

## 2024-05-06 DIAGNOSIS — M8588 Other specified disorders of bone density and structure, other site: Secondary | ICD-10-CM | POA: Diagnosis not present

## 2024-05-06 DIAGNOSIS — M47816 Spondylosis without myelopathy or radiculopathy, lumbar region: Secondary | ICD-10-CM | POA: Diagnosis not present

## 2024-05-06 DIAGNOSIS — W19XXXA Unspecified fall, initial encounter: Secondary | ICD-10-CM | POA: Diagnosis not present

## 2024-05-06 DIAGNOSIS — S0990XA Unspecified injury of head, initial encounter: Secondary | ICD-10-CM | POA: Diagnosis not present

## 2024-05-06 DIAGNOSIS — S3992XA Unspecified injury of lower back, initial encounter: Secondary | ICD-10-CM | POA: Diagnosis present

## 2024-05-06 LAB — CBC WITH DIFFERENTIAL/PLATELET
Abs Immature Granulocytes: 0.04 K/uL (ref 0.00–0.07)
Basophils Absolute: 0.1 K/uL (ref 0.0–0.1)
Basophils Relative: 1 %
Eosinophils Absolute: 0.2 K/uL (ref 0.0–0.5)
Eosinophils Relative: 2 %
HCT: 37.7 % — ABNORMAL LOW (ref 39.0–52.0)
Hemoglobin: 12.9 g/dL — ABNORMAL LOW (ref 13.0–17.0)
Immature Granulocytes: 0 %
Lymphocytes Relative: 14 %
Lymphs Abs: 1.3 K/uL (ref 0.7–4.0)
MCH: 31 pg (ref 26.0–34.0)
MCHC: 34.2 g/dL (ref 30.0–36.0)
MCV: 90.6 fL (ref 80.0–100.0)
Monocytes Absolute: 0.9 K/uL (ref 0.1–1.0)
Monocytes Relative: 10 %
Neutro Abs: 7.1 K/uL (ref 1.7–7.7)
Neutrophils Relative %: 73 %
Platelets: 170 K/uL (ref 150–400)
RBC: 4.16 MIL/uL — ABNORMAL LOW (ref 4.22–5.81)
RDW: 13.9 % (ref 11.5–15.5)
WBC: 9.6 K/uL (ref 4.0–10.5)
nRBC: 0 % (ref 0.0–0.2)

## 2024-05-06 LAB — BASIC METABOLIC PANEL WITH GFR
Anion gap: 13 (ref 5–15)
BUN: 18 mg/dL (ref 8–23)
CO2: 22 mmol/L (ref 22–32)
Calcium: 8.5 mg/dL — ABNORMAL LOW (ref 8.9–10.3)
Chloride: 101 mmol/L (ref 98–111)
Creatinine, Ser: 1.56 mg/dL — ABNORMAL HIGH (ref 0.61–1.24)
GFR, Estimated: 42 mL/min — ABNORMAL LOW (ref >60)
Glucose, Bld: 162 mg/dL — ABNORMAL HIGH (ref 70–99)
Potassium: 3.4 mmol/L — ABNORMAL LOW (ref 3.5–5.1)
Sodium: 136 mmol/L (ref 135–145)

## 2024-05-06 LAB — RESP PANEL BY RT-PCR (RSV, FLU A&B, COVID)  RVPGX2
Influenza A by PCR: NEGATIVE
Influenza B by PCR: NEGATIVE
Resp Syncytial Virus by PCR: NEGATIVE
SARS Coronavirus 2 by RT PCR: NEGATIVE

## 2024-05-06 LAB — BRAIN NATRIURETIC PEPTIDE: B Natriuretic Peptide: 91 pg/mL (ref 0.0–100.0)

## 2024-05-06 LAB — TROPONIN I (HIGH SENSITIVITY)
Troponin I (High Sensitivity): 5 ng/L (ref <18)
Troponin I (High Sensitivity): 6 ng/L (ref <18)

## 2024-05-06 LAB — PROTIME-INR
INR: 2.9 — ABNORMAL HIGH (ref 0.8–1.2)
Prothrombin Time: 31.8 s — ABNORMAL HIGH (ref 11.4–15.2)

## 2024-05-06 MED ORDER — HYDROCODONE-ACETAMINOPHEN 5-325 MG PO TABS
1.0000 | ORAL_TABLET | ORAL | 0 refills | Status: DC | PRN
Start: 2024-05-06 — End: 2024-06-04

## 2024-05-06 MED ORDER — IOHEXOL 300 MG/ML  SOLN
100.0000 mL | Freq: Once | INTRAMUSCULAR | Status: AC | PRN
  Administered 2024-05-06: 100 mL via INTRAVENOUS

## 2024-05-06 MED ORDER — ONDANSETRON HCL 4 MG/2ML IJ SOLN
4.0000 mg | Freq: Once | INTRAMUSCULAR | Status: AC
  Administered 2024-05-06: 4 mg via INTRAVENOUS
  Filled 2024-05-06: qty 2

## 2024-05-06 MED ORDER — MORPHINE SULFATE (PF) 4 MG/ML IV SOLN
4.0000 mg | Freq: Once | INTRAVENOUS | Status: AC
  Administered 2024-05-06: 4 mg via INTRAVENOUS
  Filled 2024-05-06: qty 1

## 2024-05-06 MED ORDER — SODIUM CHLORIDE 0.9 % IV BOLUS
500.0000 mL | Freq: Once | INTRAVENOUS | Status: AC
  Administered 2024-05-06: 500 mL via INTRAVENOUS

## 2024-05-06 NOTE — ED Triage Notes (Signed)
 Pt states he has been having SOB since discharge on 7/19.

## 2024-05-06 NOTE — ED Notes (Signed)
 ED Provider at bedside.

## 2024-05-06 NOTE — ED Notes (Signed)
 Pt a/w TLSO brace from outside vendor

## 2024-05-06 NOTE — ED Notes (Signed)
 Pt able to ambulate with no problems

## 2024-05-06 NOTE — ED Notes (Addendum)
 Paged [facility] at this time for tlso brace DX: L1 fracture. [name]

## 2024-05-06 NOTE — ED Provider Notes (Signed)
 [place] EMERGENCY DEPARTMENT AT [facility] Provider Note   CSN: [id number] Arrival date & time: 05/06/24  1558     Patient presents with: Shortness of Breath   [name] is a 89 y.o. male.   Pt is a 89 yo male with pmhx significant for afib (on coumadin ), presbycusis (r ear better), CAD, GERD, MR, hx SBO, Crohn's disease, kidney stones, BPH.  Pt fell on 7/19.  He was seen in the ED and scans were obtained.  He had no internal injury and was d/c home.  Pt said he's been feeling sob and has been feeling nauseous since d/c.  He has not felt himself.  He has been awake and alert and has been ambulatory.         Prior to Admission medications   Medication Sig Start Date End Date Taking? Authorizing Provider  acetaminophen  (TYLENOL ) 500 MG tablet Take 1,000 mg by mouth every 6 (six) hours as needed for mild pain (pain score 1-3) or moderate pain (pain score 4-6).   Yes [provider]  CALCIUM  PO Take 300 mg by mouth daily after lunch.   Yes [provider]  Cholecalciferol (VITAMIN D3) 50 MCG (2000 UT) TABS Take 2,000 Units by mouth daily.   Yes [provider]  cyanocobalamin  (VITAMIN B12) 1000 MCG tablet Take 1,000 mcg by mouth daily.   Yes [provider]  diltiazem  (CARDIZEM  CD) 360 MG 24 hr capsule Take 1 capsule (360 mg total) by mouth daily. 03/03/24  Yes Tat, Alm, MD  HYDROcodone -acetaminophen  (NORCO/VICODIN) 5-325 MG tablet Take 1 tablet by mouth every 4 (four) hours as needed. 05/06/24  Yes Dean Clarity, MD  levothyroxine  (SYNTHROID , LEVOTHROID) 75 MCG tablet Take 75 mcg by mouth daily before breakfast.   Yes [provider]  magnesium  oxide (MAG-OX) 400 (240 Mg) MG tablet Take 400 mg by mouth 2 (two) times daily.   Yes [provider]  melatonin 3 MG TABS tablet Take 6 mg by mouth at bedtime.   Yes [provider]  metoprolol  succinate (TOPROL -XL) 50 MG 24 hr tablet Take 1 tablet (50 mg total) by  mouth daily. 03/03/24  Yes Tat, Alm, MD  nitroGLYCERIN  (NITROSTAT ) 0.4 MG SL tablet Place 0.4 mg under the tongue every 5 (five) minutes as needed for chest pain. May repeat for up to 3 doses.   Yes [provider]  omeprazole  (PRILOSEC) 40 MG capsule TAKE ONE CAPSULE BY MOUTH EVERY DAY 05/06/24  Yes Carlan, Chelsea L, NP  Probiotic Product (ALIGN) 4 MG CAPS Take 4 mg by mouth in the morning.   Yes [provider]  traZODone  (DESYREL ) 50 MG tablet Take 50 mg by mouth at bedtime.   Yes [provider]  Vedolizumab  (ENTYVIO  IV) Inject into the vein. Every 8 weeks.   Yes [provider]  warfarin (COUMADIN ) 2.5 MG tablet TAKE 1 TO 1 & 1/2 TABLETS BY MOUTH DAILY OR AS DIRECTED by coumadin  clinic Patient taking differently: Take 2.5 mg by mouth See admin instructions. 1.5 tablets on Mondays. 1 tablet on all other days 04/09/24  Yes Debera Jayson MATSU, MD    Allergies: Patient has no known allergies.    Review of Systems  Respiratory:  Positive for shortness of breath.   Gastrointestinal:  Positive for nausea.  All other systems reviewed and are negative.   Updated Vital Signs BP (!) 151/88   Pulse 75   Temp 98 F (36.7 C)   Resp  19   Ht 5' 8 (1.727 m)   Wt 69.9 kg   SpO2 95%   BMI 23.43 kg/m   Physical Exam Vitals and nursing note reviewed.  Constitutional:      Appearance: He is well-developed.  HENT:     Head: Normocephalic and atraumatic.     Mouth/Throat:     Mouth: Mucous membranes are moist.     Pharynx: Oropharynx is clear.  Eyes:     Extraocular Movements: Extraocular movements intact.     Pupils: Pupils are equal, round, and reactive to light.  Cardiovascular:     Rate and Rhythm: Normal rate. Rhythm irregular.  Pulmonary:     Effort: Pulmonary effort is normal.     Breath sounds: Normal breath sounds.  Abdominal:     General: Bowel sounds are normal.     Palpations: Abdomen is soft.  Genitourinary:    Rectum: Guaiac result  negative.  Musculoskeletal:        General: Normal range of motion.     Cervical back: Normal range of motion and neck supple.       Back:  Skin:    General: Skin is warm.     Capillary Refill: Capillary refill takes less than 2 seconds.  Neurological:     General: No focal deficit present.     Mental Status: He is alert and oriented to person, place, and time.  Psychiatric:        Mood and Affect: Mood normal.        Behavior: Behavior normal.     (all labs ordered are listed, but only abnormal results are displayed) Labs Reviewed  BASIC METABOLIC PANEL WITH GFR - Abnormal; Notable for the following components:      Result Value   Potassium 3.4 (*)    Glucose, Bld 162 (*)    Creatinine, Ser 1.56 (*)    Calcium  8.5 (*)    GFR, Estimated 42 (*)    All other components within normal limits  PROTIME-INR - Abnormal; Notable for the following components:   Prothrombin Time 31.8 (*)    INR 2.9 (*)    All other components within normal limits  CBC WITH DIFFERENTIAL/PLATELET - Abnormal; Notable for the following components:   RBC 4.16 (*)    Hemoglobin 12.9 (*)    HCT 37.7 (*)    All other components within normal limits  RESP PANEL BY RT-PCR (RSV, FLU A&B, COVID)  [id number]  BRAIN NATRIURETIC PEPTIDE  CBC WITH DIFFERENTIAL/PLATELET  POC OCCULT BLOOD, ED  TROPONIN I (HIGH SENSITIVITY)  TROPONIN I (HIGH SENSITIVITY)    EKG: EKG Interpretation Date/Time:  Tuesday May 06 2024 16:10:40 EDT Ventricular Rate:  73 PR Interval:    QRS Duration:  100 QT Interval:  400 QTC Calculation: 441 R Axis:   31  Text Interpretation: Atrial fibrillation Low voltage, extremity and precordial leads Probable anteroseptal infarct, old No significant change since last tracing Confirmed by Dean Clarity [phone]) on 05/06/2024 5:01:50 PM  Radiology: CT CHEST ABDOMEN PELVIS W CONTRAST Result Date: 05/06/2024 CLINICAL DATA:  Polytrauma, blunt Pt states he has been having SOB since discharge on  7/19. EXAM: CT CHEST, ABDOMEN, AND PELVIS WITH CONTRAST TECHNIQUE: Multidetector CT imaging of the chest, abdomen and pelvis was performed following the standard protocol during bolus administration of intravenous contrast. RADIATION DOSE REDUCTION: This exam was performed according to the departmental dose-optimization program which includes automated exposure control, adjustment of the mA and/or kV according to patient  size and/or use of iterative reconstruction technique. CONTRAST:  OMNIPAQUE  IOHEXOL  300 MG/ML  SOLN COMPARISON:  CT chest abdomen pelvis 05/03/2024, CT abdomen pelvis 12/06/2023, CT abdomen pelvis 08/10/2017 FINDINGS: CHEST: Cardiovascular: No aortic injury. The thoracic aorta is normal in caliber. The heart is prominent in size. No significant pericardial effusion. Mild atherosclerotic plaque. At least 3 vessel coronary calcification. The main pulmonary artery is normal in caliber. No central pulmonary embolus. Unable to evaluate more distally due to timing of contrast. Mediastinum/Nodes: No pneumomediastinum. No mediastinal hematoma. The esophagus is unremarkable.  Small hiatal hernia. The thyroid  is unremarkable. The central airways are patent. No mediastinal, hilar, or axillary lymphadenopathy. Lungs/Pleura: Trace biapical pleural/pulmonary scarring. Redemonstration of right middle lobe cluster of micronodules. No pulmonary mass. No pulmonary contusion or laceration. No pneumatocele formation. Interval development of trace right pleural effusion. No pneumothorax. No hemothorax. Musculoskeletal/Chest wall: No chest wall mass. Diffusely decreased bone density. No acute rib or sternal fracture. No spinal fracture. ABDOMEN / PELVIS: Hepatobiliary: Not enlarged. No focal lesion. No laceration or subcapsular hematoma. Status post cholecystectomy. No biliary ductal dilatation. Pancreas: Normal pancreatic contour. No main pancreatic duct dilatation. Spleen: Not enlarged. Chronic benign cystic  lesion within the spleen. No laceration, subcapsular hematoma, or vascular injury. Adrenals/Urinary Tract: No nodularity bilaterally. Bilateral kidneys enhance symmetrically. No hydronephrosis. No contusion, laceration, or subcapsular hematoma. Fluid density lesion within left kidney likely represents a simple renal cyst. Simple renal cysts, in the absence of clinically indicated signs/symptoms, require no independent follow-up. No injury to the vascular structures or collecting systems. No hydroureter. The urinary bladder is unremarkable. On delayed imaging, there is no urothelial wall thickening and there are no filling defects in the opacified portions of the bilateral collecting systems or ureters. Stomach/Bowel: Terminal ileum resection. No small or large bowel wall thickening or dilatation. Colonic diverticulosis. Multiple ingested indication or candies within the lumen of the large bowel. Likely appendectomy. Vasculature/Lymphatics: Severe atherosclerotic plaque. No abdominal aorta or iliac aneurysm. No active contrast extravasation or pseudoaneurysm. No abdominal, pelvic, inguinal lymphadenopathy. Reproductive: Prostate is unremarkable. Other: No simple free fluid ascites. No pneumoperitoneum. No hemoperitoneum. No mesenteric hematoma identified. No organized fluid collection. Musculoskeletal: No significant soft tissue hematoma. Small fat containing left inguinal hernia. Diffusely decreased bone density. No acute pelvic fracture. Age-indeterminate L1 fracture. Please see separately dictated CT lumbar spine 05/06/2024. Other ports and devices: None. IMPRESSION: 1. Redemonstration of right middle lobe cluster of micronodules-likely inflammatory infectious in etiology. Interval development of trace right pleural effusion. 2. No acute intrathoracic, intra-abdominal, intrapelvic traumatic injury. 3. No acute fracture or traumatic malalignment of the thoracic spine. 4. Please see separately dictated CT lumbar  spine 05/06/2024. 5. Other imaging findings of potential clinical significance: Cardiomegaly. No central pulmonary embolus - unable to evaluate more distally due to timing of contrast. Small hiatal hernia. Multiple ingested indication or candies within the lumen of the large bowel. Colonic diverticulosis with no acute diverticulitis. Small fat containing left inguinal hernia. Aortic Atherosclerosis (ICD10-I70.0). Electronically Signed   By: Morgane  Naveau M.D.   On: 05/06/2024 18:30   CT L-SPINE NO CHARGE Result Date: 05/06/2024 CLINICAL DATA:  Fall and back pain. EXAM: CT LUMBAR SPINE WITHOUT CONTRAST TECHNIQUE: Multidetector CT imaging of the lumbar spine was performed without intravenous contrast administration. Multiplanar CT image reconstructions were also generated. RADIATION DOSE REDUCTION: This exam was performed according to the departmental dose-optimization program which includes automated exposure control, adjustment of the mA and/or kV according to patient size and/or use  of iterative reconstruction technique. COMPARISON:  CT abdomen pelvis dated 02/27/2024. FINDINGS: Segmentation: 5 lumbar type vertebrae. Alignment: No acute subluxation. Vertebrae: There is a nondisplaced fracture through the superior aspect of the L1 vertebra extending into the anterior vertebral body cortex. No significant compression. No retropulsion. The bones are osteopenic. Paraspinal and other soft tissues: Mild stranding of the anterior paraspinal tissue head L1. No significant fluid collection or hematoma. Disc levels: Multilevel degenerative changes. There is disc desiccation and vacuum phenomena primarily at L2-L3 and L4-L5. IMPRESSION: Nondisplaced fracture through the superior aspect of the L1 vertebra extending into the anterior vertebral body cortex. No significant compression. No retropulsion. Electronically Signed   By: Vanetta Chou M.D.   On: 05/06/2024 18:27   CT Head Wo Contrast Result Date:  05/06/2024 CLINICAL DATA:  Polytrauma, blunt Pt states he has been having SOB since discharge on 7/19. EXAM: CT HEAD WITHOUT CONTRAST TECHNIQUE: Contiguous axial images were obtained from the base of the skull through the vertex without intravenous contrast. RADIATION DOSE REDUCTION: This exam was performed according to the departmental dose-optimization program which includes automated exposure control, adjustment of the mA and/or kV according to patient size and/or use of iterative reconstruction technique. COMPARISON:  CT head 05/03/2024 FINDINGS: Brain: Patchy and confluent areas of decreased attenuation are noted throughout the deep and periventricular white matter of the cerebral hemispheres bilaterally, compatible with chronic microvascular ischemic disease. No evidence of large-territorial acute infarction. No parenchymal hemorrhage. No mass lesion. No extra-axial collection. No mass effect or midline shift. No hydrocephalus. Basilar cisterns are patent. Vascular: No hyperdense vessel. Atherosclerotic calcifications are present within the cavernous internal carotid and vertebral arteries. Skull: No acute fracture or focal lesion. Sinuses/Orbits: Right sphenoid sinus mucosal thickening. Paranasal sinuses and mastoid air cells are clear. The orbits are unremarkable. Other: None. IMPRESSION: No acute intracranial abnormality. Electronically Signed   By: Morgane  Naveau M.D.   On: 05/06/2024 18:03     Procedures   Medications Ordered in the ED  ondansetron  (ZOFRAN ) injection 4 mg (4 mg Intravenous Given 05/06/24 1639)  sodium chloride  0.9 % bolus 500 mL (0 mLs Intravenous Stopped 05/06/24 1755)  iohexol  (OMNIPAQUE ) 300 MG/ML solution 100 mL (100 mLs Intravenous Contrast Given 05/06/24 1742)  morphine  (PF) 4 MG/ML injection 4 mg (4 mg Intravenous Given 05/06/24 1811)                                    Medical Decision Making Amount and/or Complexity of Data Reviewed Labs: ordered. Radiology:  ordered.  Risk Prescription drug management.   This patient presents to the ED for concern of sob and nausea, this involves an extensive number of treatment options, and is a complaint that carries with it a high risk of complications and morbidity.  The differential diagnosis includes delayed head bleed, pulmonary contusion, internal trauma, viral syn   Co morbidities that complicate the patient evaluation  afib (on coumadin ), presbycusis (r ear better), CAD, GERD, MR, hx SBO, Crohn's disease, kidney stones, BPH   Additional history obtained:  Additional history obtained from epic chart review External records from outside source obtained and reviewed including wife   Lab Tests:  I Ordered, and personally interpreted labs.  The pertinent results include:  cbc with hgb 12.9 (hgb 15.2 on 7/19); bmp with cr sl elevated at 1.56 (1.37 on 7/19); inr 2.9; trop nl; covid/flu/rsv neg   Imaging Studies ordered:  I ordered imaging studies including ct head, ct chest/abd/pelvis, ct lumbar  I independently visualized and interpreted imaging which showed  CT head: No acute intracranial abnormality.  CT chest/abd/pelvis: . Redemonstration of right middle lobe cluster of  micronodules-likely inflammatory infectious in etiology. Interval  development of trace right pleural effusion.  2. No acute intrathoracic, intra-abdominal, intrapelvic traumatic  injury.  3. No acute fracture or traumatic malalignment of the thoracic  spine.  4. Please see separately dictated CT lumbar spine 05/06/2024.  5. Other imaging findings of potential clinical significance:  Cardiomegaly. No central pulmonary embolus - unable to evaluate more  distally due to timing of contrast. Small hiatal hernia. Multiple  ingested indication or candies within the lumen of the large bowel.  Colonic diverticulosis with no acute diverticulitis. Small fat  containing left inguinal hernia. Aortic Atherosclerosis   CT  lumbar: Nondisplaced fracture through the superior aspect of the L1 vertebra  extending into the anterior vertebral body cortex. No significant  compression. No retropulsion.   I agree with the radiologist interpretation   Cardiac Monitoring:  The patient was maintained on a cardiac monitor.  I personally viewed and interpreted the cardiac monitored which showed an underlying rhythm of: afib   Medicines ordered and prescription drug management:  I ordered medication including morphine   for pain  Reevaluation of the patient after these medicines showed that the patient improved I have reviewed the patients home medicines and have made adjustments as needed   Test Considered:  ct   Critical Interventions:  Pain control/brace   Problem List / ED Course:  L1 compression fracture:  pt placed in a TLSO brace and was able to ambulate without difficulties.  He is feeling better after pain meds.  He is stable for d/c.  He has seen [facility] in the past and is instructed to f/u with them per pt request.  Return if worse.    Reevaluation:  After the interventions noted above, I reevaluated the patient and found that they have :improved   Social Determinants of Health:  Lives at home   Dispostion:  After consideration of the diagnostic results and the patients response to treatment, I feel that the patent would benefit from discharge with outpatient f/u.       Final diagnoses:  Closed compression fracture of body of L1 vertebra (HCC)  Fall, initial encounter    ED Discharge Orders          Ordered    HYDROcodone -acetaminophen  (NORCO/VICODIN) 5-325 MG tablet  Every 4 hours PRN        05/06/24 2031               Dean Clarity, MD 05/06/24 2115

## 2024-05-06 NOTE — Progress Notes (Signed)
 Orthopedic Tech Progress Note Patient Details:  [name] 1930/12/29 [id number]  Patient ID: Marinell JONETTA Para, male   DOB: 03-25-1931, 89 y.o.   MRN: [id number] Back brace ordered from [facility]. Laymon DELENA Munroe 05/06/2024, 7:19 PM

## 2024-05-07 LAB — POC OCCULT BLOOD, ED: Fecal Occult Bld: NEGATIVE

## 2024-05-09 DIAGNOSIS — S32000A Wedge compression fracture of unspecified lumbar vertebra, initial encounter for closed fracture: Secondary | ICD-10-CM | POA: Insufficient documentation

## 2024-05-09 DIAGNOSIS — S32010A Wedge compression fracture of first lumbar vertebra, initial encounter for closed fracture: Secondary | ICD-10-CM | POA: Diagnosis not present

## 2024-05-12 ENCOUNTER — Other Ambulatory Visit: Payer: Self-pay | Admitting: Otolaryngology

## 2024-05-12 DIAGNOSIS — S32010A Wedge compression fracture of first lumbar vertebra, initial encounter for closed fracture: Secondary | ICD-10-CM

## 2024-05-14 ENCOUNTER — Encounter (INDEPENDENT_AMBULATORY_CARE_PROVIDER_SITE_OTHER): Admitting: *Deleted

## 2024-05-14 ENCOUNTER — Inpatient Hospital Stay
Admission: RE | Admit: 2024-05-14 | Discharge: 2024-05-14 | Discharge status: Home / Self Care | Source: Ambulatory Visit | Attending: Otolaryngology | Admitting: Otolaryngology

## 2024-05-14 ENCOUNTER — Telehealth: Payer: Self-pay | Admitting: Cardiology

## 2024-05-14 DIAGNOSIS — I4891 Unspecified atrial fibrillation: Secondary | ICD-10-CM

## 2024-05-14 DIAGNOSIS — Z5181 Encounter for therapeutic drug level monitoring: Secondary | ICD-10-CM

## 2024-05-14 DIAGNOSIS — S32010A Wedge compression fracture of first lumbar vertebra, initial encounter for closed fracture: Secondary | ICD-10-CM | POA: Diagnosis not present

## 2024-05-14 DIAGNOSIS — I2510 Atherosclerotic heart disease of native coronary artery without angina pectoris: Secondary | ICD-10-CM | POA: Diagnosis not present

## 2024-05-14 DIAGNOSIS — K219 Gastro-esophageal reflux disease without esophagitis: Secondary | ICD-10-CM | POA: Diagnosis not present

## 2024-05-14 HISTORY — PX: IR RADIOLOGIST EVAL & MGMT: IMG5224

## 2024-05-14 LAB — POCT INR: INR: 4.2 — AB (ref 2.0–3.0)

## 2024-05-14 NOTE — Consult Note (Signed)
 Chief Complaint: Patient was seen in consultation today for subacute vertebral compression fracture at the request of Strader,Christine  Referring Physician(s): Strader,Christine  History of Present Illness:  89 yo male with   afib (on coumadin ), presbycusis (r ear better), CAD, GERD, MR, hx SBO, Crohn's disease, kidney stones, BPH.  Pt fell on 7/19.  He was seen in the ED and scans were obtained.  He had no internal injury evident and was d/c home.   05/06/24 returned to ED feeling sob and  nauseous since d/c.  He has not felt himself.  He has been awake and alert and has been ambulatory.  CT demonstrated Nondisplaced fracture through the superior aspect of the L1 vertebra extending into the anterior vertebral body cortex. No significant compression. No retropulsion.  05/09/2024 Subsequent outpatient films showed progressive loss of height L1 vertebral body. No new bowel or bladder  control issues.  Taking Tylenol  for pain, incomplete relief. Used a Norco this morning with improved relief, but he is very anxious about the warnings of abuse potential.  Previously ambulated with cane/walker, now moves more slowly. No lower extremity tingling, pain, or weakness. Rates pain 8/10 on VAS. SCores 23/24 on [employer] Disability Questionnaire    Past Medical History:  Diagnosis Date   Anemia    Atrial fibrillation (HCC)    BPH (benign prostatic hyperplasia)    CAD (coronary artery disease)    Catheterization 2004, mild/moderate nonobstructive disease  /   nuclear, 2007, small inferior scar // no ischemia   Crohn's disease (HCC)    Elevated PSA    GERD (gastroesophageal reflux disease)    History of kidney stones    History of pneumonia    Hypothyroidism    Mitral regurgitation    Pneumonia    SBO (small bowel obstruction) (HCC)    Urinary retention    Wears glasses     Past Surgical History:  Procedure Laterality Date   AGILE CAPSULE  10/18/2011   Procedure: AGILE  CAPSULE;  Surgeon: Claudis RAYMOND Rivet, MD;  Location: [facility];  Service: Endoscopy;  Laterality: N/A;  730   BIOPSY  10/05/2022   Procedure: BIOPSY;  Surgeon: Eartha Angelia Sieving, MD;  Location: [facility];  Service: Gastroenterology;;   BIOPSY  01/02/2023   Procedure: BIOPSY;  Surgeon: Eartha Angelia Sieving, MD;  [facility]: [facility];  Service: Gastroenterology;;   CARDIAC CATHETERIZATION  2004   CATARACT EXTRACTION W/PHACO Right 06/27/2021   Procedure: CATARACT EXTRACTION PHACO AND INTRAOCULAR LENS PLACEMENT (IOC);  Surgeon: Harrie Agent, MD;  Location: [facility];  Service: Ophthalmology;  Laterality: Right;  CDE 21.98   CATARACT EXTRACTION W/PHACO Left 07/11/2021   Procedure: CATARACT EXTRACTION PHACO AND INTRAOCULAR LENS PLACEMENT LEFT EYE;  Surgeon: Harrie Agent, MD;  Location: [facility];  Service: Ophthalmology;  Laterality: Left;  CDE=10.42   CHOLECYSTECTOMY  2010   Dr. DeMason   COLONOSCOPY  2008   DeMason   COLONOSCOPY N/A 03/23/2016   Procedure: COLONOSCOPY;  Surgeon: Claudis RAYMOND Rivet, MD;  [facility]: [facility];  Service: Endoscopy;  Laterality: N/A;  1:00   CYSTOSCOPY WITH INSERTION OF UROLIFT     ESOPHAGEAL BRUSHING  10/05/2022   Procedure: ESOPHAGEAL BRUSHING;  Surgeon: Eartha Angelia Sieving, MD;  [facility]: [facility];  Service: Gastroenterology;;   ESOPHAGOGASTRODUODENOSCOPY  11/08/2011   Procedure: ESOPHAGOGASTRODUODENOSCOPY (EGD);  Surgeon: Claudis RAYMOND Rivet, MD;  [facility];  Service: Endoscopy;  Laterality: N/A;  300   ESOPHAGOGASTRODUODENOSCOPY N/A  02/15/2024   Procedure: EGD (ESOPHAGOGASTRODUODENOSCOPY);  Surgeon: Eartha Flavors, Toribio, MD;  [facility]: [facility];  Service: Gastroenterology;  Laterality: N/A;  2:00PM;ASA 3   ESOPHAGOGASTRODUODENOSCOPY (EGD) WITH PROPOFOL  N/A 10/05/2022   Procedure: ESOPHAGOGASTRODUODENOSCOPY (EGD) WITH PROPOFOL ;  Surgeon: Eartha Flavors Toribio, MD;  [facility]: [facility];  Service: Gastroenterology;   Laterality: N/A;   ESOPHAGOGASTRODUODENOSCOPY (EGD) WITH PROPOFOL  N/A 01/02/2023   Procedure: ESOPHAGOGASTRODUODENOSCOPY (EGD) WITH PROPOFOL ;  Surgeon: Eartha Flavors Toribio, MD;  [facility];  Service: Gastroenterology;  Laterality: N/A;  1:30 pm, asa 3   HEMOSTASIS CLIP PLACEMENT  10/05/2022   Procedure: HEMOSTASIS CLIP PLACEMENT;  Surgeon: Eartha Flavors Toribio, MD;  [facility]: [facility];  Service: Gastroenterology;;   POLYPECTOMY  03/23/2016   Procedure: POLYPECTOMY;  Surgeon: Claudis RAYMOND Rivet, MD;  [facility];  Service: Endoscopy;;  Cecal polyp removed via cold forceps recto-sigmoid polyp removed via cold snare   POLYPECTOMY  10/05/2022   Procedure: POLYPECTOMY;  Surgeon: Eartha Flavors Toribio, MD;  [facility];  Service: Gastroenterology;;   TRANSURETHRAL RESECTION OF PROSTATE N/A 04/29/2020   Procedure: TRANSURETHRAL RESECTION OF THE PROSTATE (TURP);  Surgeon: Matilda Senior, MD;  Location: [facility];  Service: Urology;  Laterality: N/A;  90 MINS    Allergies: Patient has no known allergies.  Medications: Prior to Admission medications   Medication Sig Start Date End Date Taking? Authorizing Provider  acetaminophen  (TYLENOL ) 500 MG tablet Take 1,000 mg by mouth every 6 (six) hours as needed for mild pain (pain score 1-3) or moderate pain (pain score 4-6).    [provider]  CALCIUM  PO Take 300 mg by mouth daily after lunch.    [provider]  Cholecalciferol (VITAMIN D3) 50 MCG (2000 UT) TABS Take 2,000 Units by mouth daily.    [provider]  cyanocobalamin  (VITAMIN B12) 1000 MCG tablet Take 1,000 mcg by mouth daily.    [provider]  diltiazem  (CARDIZEM  CD) 360 MG 24 hr capsule Take 1 capsule (360 mg total) by mouth daily. 03/03/24   Evonnie Lenis, MD  HYDROcodone -acetaminophen  (NORCO/VICODIN) 5-325 MG tablet Take 1 tablet by mouth every 4 (four) hours as needed. 05/06/24    Dean Clarity, MD  levothyroxine  (SYNTHROID , LEVOTHROID) 75 MCG tablet Take 75 mcg by mouth daily before breakfast.    [provider]  magnesium  oxide (MAG-OX) 400 (240 Mg) MG tablet Take 400 mg by mouth 2 (two) times daily.    [provider]  melatonin 3 MG TABS tablet Take 6 mg by mouth at bedtime.    [provider]  metoprolol  succinate (TOPROL -XL) 50 MG 24 hr tablet Take 1 tablet (50 mg total) by mouth daily. 03/03/24   Evonnie Lenis, MD  nitroGLYCERIN  (NITROSTAT ) 0.4 MG SL tablet Place 0.4 mg under the tongue every 5 (five) minutes as needed for chest pain. May repeat for up to 3 doses.    [provider]  omeprazole  (PRILOSEC) 40 MG capsule TAKE ONE CAPSULE BY MOUTH EVERY DAY 05/06/24   Carlan, Chelsea L, NP  Probiotic Product (ALIGN) 4 MG CAPS Take 4 mg by mouth in the morning.    [provider]  traZODone  (DESYREL ) 50 MG tablet Take 50 mg by mouth at bedtime.    [provider]  Vedolizumab  (ENTYVIO  IV) Inject into the vein. Every 8 weeks.    [provider]  warfarin (COUMADIN ) 2.5 MG tablet TAKE 1 TO 1 & 1/2 TABLETS BY MOUTH  DAILY OR AS DIRECTED by coumadin  clinic Patient taking differently: Take 2.5 mg by mouth See admin instructions. 1.5 tablets on Mondays. 1 tablet on all other days 04/09/24   Debera Jayson MATSU, MD     Family History  Problem Relation Age of Onset   Colon cancer Mother    Heart disease Father    Stroke Brother    Healthy Daughter     Social History   Socioeconomic History   Marital status: Married    Spouse name: Not on file   Number of children: Not on file   Years of education: Not on file   Highest education level: Not on file  Occupational History   Occupation: Retired-Hospital Radio broadcast assistant: RETIRED  Tobacco Use   Smoking status: Never    Passive exposure: Never   Smokeless tobacco: Never  Vaping Use   Vaping status: Never Used  Substance and Sexual Activity   Alcohol   use: No    Alcohol /week: 0.0 standard drinks of alcohol    Drug use: No   Sexual activity: Not on file  Other Topics Concern   Not on file  Social History Narrative   Married   Social Drivers of Health   Financial Resource Strain: Not on file  Food Insecurity: No Food Insecurity (02/27/2024)   Hunger Vital Sign    Worried About Running Out of Food in the Last Year: Never true    Ran Out of Food in the Last Year: Never true  Transportation Needs: No Transportation Needs (02/27/2024)   PRAPARE - Administrator, Civil Service (Medical): No    Lack of Transportation (Non-Medical): No  Physical Activity: Not on file  Stress: Not on file  Social Connections: Socially Integrated (02/27/2024)   Social Connection and Isolation Panel    Frequency of Communication with Friends and Family: More than three times a week    Frequency of Social Gatherings with Friends and Family: More than three times a week    Attends Religious Services: More than 4 times per year    Active Member of [employer] or Organizations: Yes    Attends Engineer, structural: More than 4 times per year    Marital Status: Married    ECOG Status: 2 - Symptomatic, <50% confined to bed  Review of Systems: A 12 point ROS discussed and pertinent positives are indicated in the HPI above.  All other systems are negative.  Review of Systems  Vital Signs: BP 130/70   Pulse 83   Temp 98.1 F (36.7 C)   Resp 16   SpO2 97%    Physical Exam Constitutional: Oriented to person, place, and time. Well-developed and well-nourished. No distress.   HENT:  Head: Normocephalic and atraumatic.  Eyes: Conjunctivae and EOM are normal. Right eye exhibits no discharge. Left eye exhibits no discharge. No scleral icterus.  Neck: No JVD present.  Pulmonary/Chest: Effort normal. No stridor. No respiratory distress.  Abdomen: soft, non distended Neurological:  alert and oriented to person, place, and time.  Skin: Skin is  warm and dry.  not diaphoretic.  Psychiatric:   normal mood and affect.   behavior is normal. Judgment and thought content normal.       Imaging: CT CHEST ABDOMEN PELVIS W CONTRAST Result Date: 05/06/2024 CLINICAL DATA:  Polytrauma, blunt Pt states he has been having SOB since discharge on 7/19. EXAM: CT CHEST, ABDOMEN, AND PELVIS WITH CONTRAST TECHNIQUE: Multidetector CT imaging of the chest,  abdomen and pelvis was performed following the standard protocol during bolus administration of intravenous contrast. RADIATION DOSE REDUCTION: This exam was performed according to the departmental dose-optimization program which includes automated exposure control, adjustment of the mA and/or kV according to patient size and/or use of iterative reconstruction technique. CONTRAST:  OMNIPAQUE  IOHEXOL  300 MG/ML  SOLN COMPARISON:  CT chest abdomen pelvis 05/03/2024, CT abdomen pelvis 12/06/2023, CT abdomen pelvis 08/10/2017 FINDINGS: CHEST: Cardiovascular: No aortic injury. The thoracic aorta is normal in caliber. The heart is prominent in size. No significant pericardial effusion. Mild atherosclerotic plaque. At least 3 vessel coronary calcification. The main pulmonary artery is normal in caliber. No central pulmonary embolus. Unable to evaluate more distally due to timing of contrast. Mediastinum/Nodes: No pneumomediastinum. No mediastinal hematoma. The esophagus is unremarkable.  Small hiatal hernia. The thyroid  is unremarkable. The central airways are patent. No mediastinal, hilar, or axillary lymphadenopathy. Lungs/Pleura: Trace biapical pleural/pulmonary scarring. Redemonstration of right middle lobe cluster of micronodules. No pulmonary mass. No pulmonary contusion or laceration. No pneumatocele formation. Interval development of trace right pleural effusion. No pneumothorax. No hemothorax. Musculoskeletal/Chest wall: No chest wall mass. Diffusely decreased bone density. No acute rib or sternal fracture. No  spinal fracture. ABDOMEN / PELVIS: Hepatobiliary: Not enlarged. No focal lesion. No laceration or subcapsular hematoma. Status post cholecystectomy. No biliary ductal dilatation. Pancreas: Normal pancreatic contour. No main pancreatic duct dilatation. Spleen: Not enlarged. Chronic benign cystic lesion within the spleen. No laceration, subcapsular hematoma, or vascular injury. Adrenals/Urinary Tract: No nodularity bilaterally. Bilateral kidneys enhance symmetrically. No hydronephrosis. No contusion, laceration, or subcapsular hematoma. Fluid density lesion within left kidney likely represents a simple renal cyst. Simple renal cysts, in the absence of clinically indicated signs/symptoms, require no independent follow-up. No injury to the vascular structures or collecting systems. No hydroureter. The urinary bladder is unremarkable. On delayed imaging, there is no urothelial wall thickening and there are no filling defects in the opacified portions of the bilateral collecting systems or ureters. Stomach/Bowel: Terminal ileum resection. No small or large bowel wall thickening or dilatation. Colonic diverticulosis. Multiple ingested indication or candies within the lumen of the large bowel. Likely appendectomy. Vasculature/Lymphatics: Severe atherosclerotic plaque. No abdominal aorta or iliac aneurysm. No active contrast extravasation or pseudoaneurysm. No abdominal, pelvic, inguinal lymphadenopathy. Reproductive: Prostate is unremarkable. Other: No simple free fluid ascites. No pneumoperitoneum. No hemoperitoneum. No mesenteric hematoma identified. No organized fluid collection. Musculoskeletal: No significant soft tissue hematoma. Small fat containing left inguinal hernia. Diffusely decreased bone density. No acute pelvic fracture. Age-indeterminate L1 fracture. Please see separately dictated CT lumbar spine 05/06/2024. Other ports and devices: None. IMPRESSION: 1. Redemonstration of right middle lobe cluster of  micronodules-likely inflammatory infectious in etiology. Interval development of trace right pleural effusion. 2. No acute intrathoracic, intra-abdominal, intrapelvic traumatic injury. 3. No acute fracture or traumatic malalignment of the thoracic spine. 4. Please see separately dictated CT lumbar spine 05/06/2024. 5. Other imaging findings of potential clinical significance: Cardiomegaly. No central pulmonary embolus - unable to evaluate more distally due to timing of contrast. Small hiatal hernia. Multiple ingested indication or candies within the lumen of the large bowel. Colonic diverticulosis with no acute diverticulitis. Small fat containing left inguinal hernia. Aortic Atherosclerosis (ICD10-I70.0). Electronically Signed   By: Morgane  Naveau M.D.   On: 05/06/2024 18:30   CT L-SPINE NO CHARGE Result Date: 05/06/2024 CLINICAL DATA:  Fall and back pain. EXAM: CT LUMBAR SPINE WITHOUT CONTRAST TECHNIQUE: Multidetector CT imaging of the lumbar spine was  performed without intravenous contrast administration. Multiplanar CT image reconstructions were also generated. RADIATION DOSE REDUCTION: This exam was performed according to the departmental dose-optimization program which includes automated exposure control, adjustment of the mA and/or kV according to patient size and/or use of iterative reconstruction technique. COMPARISON:  CT abdomen pelvis dated 02/27/2024. FINDINGS: Segmentation: 5 lumbar type vertebrae. Alignment: No acute subluxation. Vertebrae: There is a nondisplaced fracture through the superior aspect of the L1 vertebra extending into the anterior vertebral body cortex. No significant compression. No retropulsion. The bones are osteopenic. Paraspinal and other soft tissues: Mild stranding of the anterior paraspinal tissue head L1. No significant fluid collection or hematoma. Disc levels: Multilevel degenerative changes. There is disc desiccation and vacuum phenomena primarily at L2-L3 and L4-L5.  IMPRESSION: Nondisplaced fracture through the superior aspect of the L1 vertebra extending into the anterior vertebral body cortex. No significant compression. No retropulsion. Electronically Signed   By: Vanetta Chou M.D.   On: 05/06/2024 18:27   CT Head Wo Contrast Result Date: 05/06/2024 CLINICAL DATA:  Polytrauma, blunt Pt states he has been having SOB since discharge on 7/19. EXAM: CT HEAD WITHOUT CONTRAST TECHNIQUE: Contiguous axial images were obtained from the base of the skull through the vertex without intravenous contrast. RADIATION DOSE REDUCTION: This exam was performed according to the departmental dose-optimization program which includes automated exposure control, adjustment of the mA and/or kV according to patient size and/or use of iterative reconstruction technique. COMPARISON:  CT head 05/03/2024 FINDINGS: Brain: Patchy and confluent areas of decreased attenuation are noted throughout the deep and periventricular white matter of the cerebral hemispheres bilaterally, compatible with chronic microvascular ischemic disease. No evidence of large-territorial acute infarction. No parenchymal hemorrhage. No mass lesion. No extra-axial collection. No mass effect or midline shift. No hydrocephalus. Basilar cisterns are patent. Vascular: No hyperdense vessel. Atherosclerotic calcifications are present within the cavernous internal carotid and vertebral arteries. Skull: No acute fracture or focal lesion. Sinuses/Orbits: Right sphenoid sinus mucosal thickening. Paranasal sinuses and mastoid air cells are clear. The orbits are unremarkable. Other: None. IMPRESSION: No acute intracranial abnormality. Electronically Signed   By: Morgane  Naveau M.D.   On: 05/06/2024 18:03   CT T-SPINE NO CHARGE Result Date: 05/03/2024 EXAM: CT THORACIC SPINE WITHOUT CONTRAST 05/03/2024 08:21:18 PM TECHNIQUE: CT of the thoracic spine was performed without the administration of intravenous contrast. Multiplanar  reformatted images are provided for review. Automated exposure control, iterative reconstruction, and/or weight based adjustment of the mA/kV was utilized to reduce the radiation dose to as low as reasonably achievable. COMPARISON: None available. CLINICAL HISTORY: Ataxia, thoracic trauma. Pt fell after pulling electric blanket cord and pt lost his balance causing him to fall backwards. Denies LOC. Landed on bottom and lightly hit back of head on carpeted floor. Occurred this morning at 1000. + nausea, denies emesis. Pt is on blood thinner Warfarin. FINDINGS: BONES AND ALIGNMENT: Normal vertebral body heights. No acute fracture or suspicious bone lesion. Normal alignment. DEGENERATIVE CHANGES: Mild degenerative changes of the mid to lower thoracic spine. SOFT TISSUES: Evaluated on dedicated CT chest. IMPRESSION: 1. No traumatic injury to the thoracic spine. Electronically signed by: Pinkie Pebbles MD 05/03/2024 08:41 PM EDT RP Workstation: [id number]   CT Cervical Spine Wo Contrast Result Date: 05/03/2024 EXAM: CT CERVICAL SPINE WITHOUT CONTRAST 05/03/2024 08:21:18 PM TECHNIQUE: CT of the cervical spine was performed without the administration of intravenous contrast. Multiplanar reformatted images are provided for review. Automated exposure control, iterative reconstruction, and/or weight based adjustment of  the mA/kV was utilized to reduce the radiation dose to as low as reasonably achievable. COMPARISON: None available. CLINICAL HISTORY: Ataxia, cervical trauma. Pt fell after pulling electric blanket cord and pt lost his balance causing him to fall backwards. Denies LOC. Landed on bottom and lightly hit back of head on carpeted floor. Occurred this morning at 1000. + nausea, denies emesis. Pt is on blood thinner Warfarin. FINDINGS: CERVICAL SPINE: BONES AND ALIGNMENT: No acute fracture or traumatic malalignment. DEGENERATIVE CHANGES: Mild degenerative changes of the mid cervical spine. SOFT TISSUES: No  prevertebral soft tissue swelling. IMPRESSION: 1. No traumatic injury to the cervical spine. Electronically signed by: Pinkie Pebbles MD 05/03/2024 08:40 PM EDT RP Workstation: [id number]   CT CHEST ABDOMEN PELVIS W CONTRAST Result Date: 05/03/2024 EXAM: CT CHEST, ABDOMEN AND PELVIS WITH CONTRAST 05/03/2024 08:21:18 PM TECHNIQUE: CT of the chest, abdomen and pelvis was performed with the administration of intravenous contrast. Multiplanar reformatted images are provided for review. Automated exposure control, iterative reconstruction, and/or weight based adjustment of the mA/kV was utilized to reduce the radiation dose to as low as reasonably achievable. COMPARISON: CT abdomen and pelvis dated 02/27/2024. CLINICAL HISTORY: Right sided abdominal and flank pain. Patient fell after pulling electric blanket cord and lost balance causing a fall backwards. Denies loss of consciousness. Landed on bottom and lightly hit back of head on carpeted floor. Occurred this morning at 1000. Positive nausea, denies emesis. Patient is on blood thinner Warfarin. FINDINGS: CHEST: MEDIASTINUM: Heart and pericardium are unremarkable. The central airways are clear. Mild 3-vessel coronary atherosclerosis. THORACIC LYMPH NODES: No mediastinal, hilar or axillary lymphadenopathy. LUNGS AND PLEURA: Mild centrilobular emphysematous changes, upper lung predominant. No focal consolidation or pulmonary edema. No pleural effusion or pneumothorax. ABDOMEN AND PELVIS: LIVER: The liver is unremarkable. GALLBLADDER AND BILE DUCTS: Status post cholecystectomy. No biliary ductal dilatation. SPLEEN: 2.7 cm medial splenic cyst (image 86), benign. PANCREAS: No acute abnormality. ADRENAL GLANDS: No acute abnormality. KIDNEYS, URETERS AND BLADDER: 11 mm posterior left lower pole renal cyst (image 94), benign (Bosniak 1). No follow up is recommended. No stones in the kidneys or ureters. No hydronephrosis. No perinephric or periureteral stranding. Urinary  bladder is unremarkable. GI AND BOWEL: Tiny hiatal hernia. Normal appendix (image 86). Small bowel resection with suture line in the right mid abdomen (image 72). There are numerous ingested tablets in this location (image 67), suggesting some degree of anastomotic stricture. No dilated bowel to suggest small bowel obstruction. Mild left colonic diverticulosis, without evidence of diverticulitis. REPRODUCTIVE ORGANS: Prostate is notable for posterior cutaneous related to prior TURP. PERITONEUM AND RETROPERITONEUM: No ascites. No free air. VASCULATURE: Atherosclerotic calcifications of the abdominal aorta and branch vessels, although patent. Mild thoracic aortic atherosclerosis. ABDOMINAL AND PELVIS LYMPH NODES: No lymphadenopathy. REPRODUCTIVE ORGANS: No acute abnormality. BONES AND SOFT TISSUES: Mild degenerative changes of the mid/lower thoracic spine. Moderate degenerative changes of the lumbar spine. Small fat containing left inguinal hernia. No acute osseous abnormality. No focal soft tissue abnormality. IMPRESSION: 1. Small bowel resection with suture line in the right mid abdomen. Numerous ingested tablets in this location suggest some degree of anastomotic stricture. No small bowel obstruction. 2. No acute findings in the chest. Electronically signed by: Pinkie Pebbles MD 05/03/2024 08:39 PM EDT RP Workstation: [id number]   CT Head Wo Contrast Result Date: 05/03/2024 EXAM: CT HEAD WITHOUT CONTRAST 05/03/2024 08:21:18 PM TECHNIQUE: CT of the head was performed without the administration of intravenous contrast. Automated exposure control, iterative reconstruction, and/or weight based  adjustment of the mA/kV was utilized to reduce the radiation dose to as low as reasonably achievable. COMPARISON: 12/24/2023 CLINICAL HISTORY: Head trauma, minor (Age >= 65y). Pt fell after pulling electric blanket cord and pt lost his balance causing him to fall backwards. Denies LOC. Landed on bottom and lightly hit back  of head on carpeted floor. Occurred this morning at 1000. + nausea, denies emesis. Pt is on blood thinner Warfarin. FINDINGS: BRAIN AND VENTRICLES: No acute hemorrhage. Gray-white differentiation is preserved. No hydrocephalus. No extra-axial collection. No mass effect or midline shift. Global cortical and central atrophy. Subcortical and periventricular small vessel ischemic changes. ORBITS: No acute abnormality. SINUSES: No acute abnormality. SOFT TISSUES AND SKULL: No acute soft tissue abnormality. No skull fracture. VASCULATURE: Intracranial atherosclerosis. IMPRESSION: 1. No acute intracranial abnormality. 2. Atrophy with small vessel ischemic changes. Electronically signed by: Pinkie Pebbles MD 05/03/2024 08:27 PM EDT RP Workstation: [id number]   DG Hip Unilat W or Wo Pelvis 2-3 Views Right Result Date: 05/03/2024 CLINICAL DATA:  Lateral pain after fall. EXAM: DG HIP (WITH OR WITHOUT PELVIS) 2-3V RIGHT COMPARISON:  None Available. FINDINGS: No evidence of acute fracture of the pelvis or right hip. No hip dislocation. Mild bilateral hip osteoarthritis. Pubic rami are intact. Pubic symphysis and sacroiliac joints are congruent. IMPRESSION: 1. No acute fracture or dislocation of the pelvis or right hip. 2. Mild bilateral hip osteoarthritis. Electronically Signed   By: Andrea Gasman M.D.   On: 05/03/2024 19:36    Labs:  CBC: Recent Labs    03/03/24 0455 04/24/24 0924 05/03/24 1922 05/06/24 1655  WBC 11.4* 8.1 11.1* 9.6  HGB 13.4 14.9 15.2 12.9*  HCT 40.7 44.8 44.1 37.7*  PLT 212 207 214 170    COAGS: Recent Labs    04/23/24 1115 05/03/24 1922 05/06/24 1630 05/14/24 1119  INR 3.0 2.5* 2.9* 4.2*    BMP: Recent Labs    03/03/24 0455 04/24/24 0924 05/03/24 1922 05/06/24 1630  NA 132* 138 139 136  K 4.0 3.9 4.1 3.4*  CL 102 101 101 101  CO2 24 23 25 22   GLUCOSE 107* 178* 123* 162*  BUN 18 17 16 18   CALCIUM  8.6* 9.3 9.1 8.5*  CREATININE 1.33* 1.43* 1.37* 1.56*  GFRNONAA  51* 47* 49* 42*    LIVER FUNCTION TESTS: Recent Labs    02/27/24 0537 02/28/24 0457 04/24/24 0924 05/03/24 1922  BILITOT 1.0 1.7* 0.8 0.8  AST 20 18 26 27   ALT 15 15 21 19   ALKPHOS 49 60 87 88  PROT 6.0* 6.3* 7.7 7.5  ALBUMIN 3.5 3.4* 4.0 4.2    TUMOR MARKERS: No results for input(s): AFPTM, CEA, CA199, CHROMGRNA in the last 8760 hours.  Assessment and Plan: My impression is that this patient has subacute lumbar L1 compression fracture deformity which likely contributes or accounts for most of the low back pain.  Based on cross-sectional imaging, this would be anatomically approachable for percutaneous intervention.  No associated spinal stenosis or other contraindications.  No suggestion of metastatic disease or other pathologic findings to indicate a need for concomitant core biopsy. Given the  lack of adequate symptom relief with time and a fairly aggressive pain medication regimen, and   limitations of activities of daily living, the patient  is clinically an appropriate candidate for consideration of vertebral augmentation. I discussed with the patient  and spouse the pathophysiology of vertebral compression fracture deformities reviewing his images and spine model; the stable nature of these which does  not require emergent treatment; natural history which includes healing over some unpredictable number of months.  We discussed treatment options including watchful waiting, surgical fixation, and percutaneous kyphoplasty/vertebroplasty.  We discussed in detail the percutaneous kyphoplasty technique, anticipated benefits, time course to symptom resolution, possible risks and side effects.  We discussed his elevated risk of additional level fractures with or without vertebral augmentation.  We discussed the long-term need for continued bone building therapy managed by the patient's PCP.   They seemed to understand, and did ask appropriate questions. The patient is motivated to  proceed with treatment ASAP.  Accordingly, we schedule percutaneous lumbar L1 kyphoplasty under moderate sedation  as an outpatient at the patient's convenience, pending carrier approval if needed. Will consult cardiology re coumadin  management.   Thank you for this interesting consult.  I greatly enjoyed meeting [name] and look forward to participating in their care.  A copy of this report was sent to the requesting provider on this date.  Electronically Signed: Dayne Addison Freimuth 05/14/2024, 5:18 PM   I spent a total of  30 Minutes   in face to face in clinical consultation, greater than 50% of which was counseling/coordinating care for subacute painful lumbar L1 compression fracture.

## 2024-05-14 NOTE — Patient Instructions (Signed)
 Hold warfarin tonight then decrease dose to 1 tablet daily except 1/2 tablet on Mondays and Thursdays Recheck 2 weeks Waiting to be scheduled for Kyphoplasty

## 2024-05-14 NOTE — Progress Notes (Signed)
 INR 4.2. Please see anticoagulation encounter

## 2024-05-14 NOTE — Telephone Encounter (Signed)
   Pre-operative Risk Assessment    Patient Name: [name]  DOB: [birth year] MRN: [id number]      Request for Surgical Clearance    Procedure:  Kyphoplasty  Date of Surgery:  Clearance TBD                                 Surgeon:  not indicated Surgeon's Group or Practice Name:  [facility] Phone number:  [phone] Fax number:  [phone]   Type of Clearance Requested:   - Pharmacy:  Hold Warfarin (Coumadin ) hold for 4 days before procedure    Type of Anesthesia:  Not Indicated   Additional requests/questions:  Please fax a copy of clearance to the surgeon's office.  Signed, Darryle GORMAN Glance   05/14/2024, 3:24 PM

## 2024-05-14 NOTE — Telephone Encounter (Signed)
 Pharmacy please advise on holding warfarin prior to Kyphoplasty  scheduled for TBD. Last labs 05/06/2024. Thank you.

## 2024-05-15 DIAGNOSIS — I10 Essential (primary) hypertension: Secondary | ICD-10-CM | POA: Diagnosis not present

## 2024-05-15 DIAGNOSIS — E039 Hypothyroidism, unspecified: Secondary | ICD-10-CM | POA: Diagnosis not present

## 2024-05-15 DIAGNOSIS — M545 Low back pain, unspecified: Secondary | ICD-10-CM | POA: Diagnosis not present

## 2024-05-15 DIAGNOSIS — I482 Chronic atrial fibrillation, unspecified: Secondary | ICD-10-CM | POA: Diagnosis not present

## 2024-05-15 DIAGNOSIS — G4700 Insomnia, unspecified: Secondary | ICD-10-CM | POA: Diagnosis not present

## 2024-05-15 DIAGNOSIS — E782 Mixed hyperlipidemia: Secondary | ICD-10-CM | POA: Diagnosis not present

## 2024-05-19 ENCOUNTER — Telehealth: Payer: Self-pay | Admitting: Cardiology

## 2024-05-19 NOTE — Telephone Encounter (Signed)
 Called and spoke with wife [name].  Informed her we have received surgical clearance request from [facility].  Awaiting response from Pharmacy about hold coumadin .  Will let [name] know something so procedure can be scheduled.  She verbalized understanding.

## 2024-05-19 NOTE — Telephone Encounter (Signed)
Pharmacy please advise  

## 2024-05-19 NOTE — Telephone Encounter (Signed)
 Called pt to let him know from a cardiology standpoint he has been cleared for procedure.  He will hold warfarin 5 days before Kyphoplasty and resume night of procedure if OK with IR.  Pt to let me know when procedure date is scheduled.

## 2024-05-19 NOTE — Telephone Encounter (Signed)
     Primary Cardiologist: Jayson Sierras, MD  Clinical pharmacist have reviewed patient's chart and provided the following recommendations for, MALIK PAAR :  Patient has not had an Afib/aflutter ablation within the last 3 months or DCCV within the last 30 days   Per office protocol, patient can hold warfarin for 5 days prior to procedure.     Patient will not need bridging with Lovenox (enoxaparin) around procedure.  I will route this recommendation to the requesting party via Epic fax function and remove from pre-op pool.  Please call with questions.  Josefa HERO. Jasmyne Lodato NP-C     05/19/2024, 2:19 PM [facility] [address] Office [phone] Fax [phone]

## 2024-05-19 NOTE — Telephone Encounter (Signed)
 Pt's wife [name] is requesting a callback regarding her wanting to check on office receiving a letter and she'd like to discuss his INR with [name]. Please advise

## 2024-05-19 NOTE — Telephone Encounter (Signed)
 Wife [name]) stated she was returning RN's call.

## 2024-05-19 NOTE — Telephone Encounter (Signed)
 Patient with diagnosis of afib on warfarin for anticoagulation.    Procedure: Kyphoplasty  Date of procedure: TBD   CHA2DS2-VASc Score = 5   This indicates a 7.2% annual risk of stroke. The patient's score is based upon: CHF History: 1 HTN History: 1 Diabetes History: 0 Stroke History: 0 Vascular Disease History: 1 Age Score: 2 Gender Score: 0      Patient has not had an Afib/aflutter ablation within the last 3 months or DCCV within the last 30 days  Per office protocol, patient can hold warfarin for 5 days prior to procedure.    Patient will not need bridging with Lovenox (enoxaparin) around procedure.  **This guidance is not considered finalized until pre-operative APP has relayed final recommendations.**

## 2024-05-20 ENCOUNTER — Other Ambulatory Visit: Payer: Self-pay | Admitting: Cardiology

## 2024-05-20 ENCOUNTER — Other Ambulatory Visit: Payer: Self-pay | Admitting: Interventional Radiology

## 2024-05-20 DIAGNOSIS — S32010A Wedge compression fracture of first lumbar vertebra, initial encounter for closed fracture: Secondary | ICD-10-CM

## 2024-05-20 DIAGNOSIS — M8008XA Age-related osteoporosis with current pathological fracture, vertebra(e), initial encounter for fracture: Secondary | ICD-10-CM

## 2024-05-20 NOTE — Telephone Encounter (Signed)
 Spoke with wife Dickey.  States kyphoplasty has been scheduled for 05/27/24. Will hold warfarin 5 days before procedure.Last dose of warfarin will be 05/22/24.  INR appt moved to 05/26/24.  She verbalized understanding.

## 2024-05-22 ENCOUNTER — Telehealth: Payer: Self-pay

## 2024-05-23 ENCOUNTER — Inpatient Hospital Stay: Attending: Hematology

## 2024-05-23 VITALS — BP 130/72 | HR 80 | Temp 97.7°F | Resp 18

## 2024-05-23 DIAGNOSIS — E3409 Other carcinoid syndrome: Secondary | ICD-10-CM | POA: Diagnosis not present

## 2024-05-23 DIAGNOSIS — C7A010 Malignant carcinoid tumor of the duodenum: Secondary | ICD-10-CM | POA: Insufficient documentation

## 2024-05-23 MED ORDER — LANREOTIDE ACETATE 120 MG/0.5ML ~~LOC~~ SOLN
120.0000 mg | Freq: Once | SUBCUTANEOUS | Status: AC
  Administered 2024-05-23: 120 mg via SUBCUTANEOUS
  Filled 2024-05-23: qty 120

## 2024-05-23 NOTE — Progress Notes (Signed)
 [name] presents today for injection per the provider's orders. Lanreotide    administration without incident; injection site WNL; see MAR for injection details.  Patient tolerated procedure well and without incident.  No questions or complaints noted at this time.   Discharged from clinic via wheelchair in stable condition. Alert and oriented x 3. F/U with [facility] as scheduled.

## 2024-05-23 NOTE — Patient Instructions (Signed)
 [facility] [place] - A DEPT OF [facility]  Discharge Instructions: Thank you for choosing [facility] to provide your oncology and hematology care.  If you have a lab appointment with the Cancer Center - please note that after April 8th, 2024, all labs will be drawn in the cancer center.  You do not have to check in or register with the main entrance as you have in the past but will complete your check-in in the cancer center.  Wear comfortable clothing and clothing appropriate for easy access to any Portacath or PICC line.   We strive to give you quality time with your provider. You may need to reschedule your appointment if you arrive late (15 or more minutes).  Arriving late affects you and other patients whose appointments are after yours.  Also, if you miss three or more appointments without notifying the office, you may be dismissed from the clinic at the provider's discretion.      For prescription refill requests, have your pharmacy contact our office and allow 72 hours for refills to be completed.    Today you received Lanreotide injection.     BELOW ARE SYMPTOMS THAT SHOULD BE REPORTED IMMEDIATELY: *FEVER GREATER THAN 100.4 F (38 C) OR HIGHER *CHILLS OR SWEATING *NAUSEA AND VOMITING THAT IS NOT CONTROLLED WITH YOUR NAUSEA MEDICATION *UNUSUAL SHORTNESS OF BREATH *UNUSUAL BRUISING OR BLEEDING *URINARY PROBLEMS (pain or burning when urinating, or frequent urination) *BOWEL PROBLEMS (unusual diarrhea, constipation, pain near the anus) TENDERNESS IN MOUTH AND THROAT WITH OR WITHOUT PRESENCE OF ULCERS (sore throat, sores in mouth, or a toothache) UNUSUAL RASH, SWELLING OR PAIN  UNUSUAL VAGINAL DISCHARGE OR ITCHING   Items with * indicate a potential emergency and should be followed up as soon as possible or go to the Emergency Department if any problems should occur.  Please show the CHEMOTHERAPY ALERT CARD or IMMUNOTHERAPY ALERT CARD at  check-in to the Emergency Department and triage nurse.  Should you have questions after your visit or need to cancel or reschedule your appointment, please contact [facility] [place] - A DEPT OF Eligha Bridegroom [facility] [phone]  and follow the prompts.  Office hours are 8:00 a.m. to 4:30 p.m. Monday - Friday. Please note that voicemails left after 4:00 p.m. may not be returned until the following business day.  We are closed weekends and major holidays. You have access to a nurse at all times for urgent questions. Please call the main number to the clinic [phone] and follow the prompts.  For any non-urgent questions, you may also contact your provider using MyChart. We now offer e-Visits for anyone 73 and older to request care online for non-urgent symptoms. For details visit [email or web address].   Also download the MyChart app! Go to the app store, search "MyChart", open the app, select [place], and log in with your MyChart username and password.

## 2024-05-26 ENCOUNTER — Telehealth: Payer: Self-pay

## 2024-05-26 ENCOUNTER — Encounter: Attending: Gastroenterology | Admitting: *Deleted

## 2024-05-26 DIAGNOSIS — Z5181 Encounter for therapeutic drug level monitoring: Secondary | ICD-10-CM | POA: Insufficient documentation

## 2024-05-26 DIAGNOSIS — I4891 Unspecified atrial fibrillation: Secondary | ICD-10-CM | POA: Insufficient documentation

## 2024-05-26 LAB — POCT INR: INR: 1.3 — AB (ref 2.0–3.0)

## 2024-05-26 NOTE — Progress Notes (Signed)
 INR 1.3 Please see anticoagulation encounter

## 2024-05-26 NOTE — Patient Instructions (Signed)
 Continue to hold warfarin tonight for kyphoplasty tomorrow. Restart warfarin tomorrow night if OK with IR.  Take an extra 1/2 tablet first 2 night then resume 1 tablet daily except 1/2 tablet on Mondays and Thursdays Recheck in 1 week

## 2024-05-26 NOTE — Discharge Instructions (Signed)
 Kyphoplasty Post Procedure Discharge Instructions  May resume a regular diet and any medications that you routinely take (including pain medications). However, if you are taking Aspirin or an anticoagulant/blood thinner you will be told when you can resume taking these by the healthcare provider. No driving day of procedure. The day of your procedure take it easy. You may use an ice pack as needed to injection sites on back.  Ice to back 30 minutes on and 30 minutes off, as needed. May remove bandaids tomorrow after taking a shower. Replace daily with a clean bandaid until healed.  Do not lift anything heavier than a milk jug for 1-2 weeks or determined by your physician.  Follow up with your physician in 2 weeks.  If you need to speak to someone after hours, please call the on call IR physician at [phone].  Tell them you are a patient of Dr. Marne Sings and that you had a Kyphoplasty today and the issues you are having.   Please contact our office at [phone] for the following symptoms or if you have any questions:  Fever greater than 100 degrees Increased swelling, pain, or redness at injection site. Increased back and/or leg pain New numbness or change in symptoms from before the procedure.    Thank you for visiting [facility]!

## 2024-05-27 ENCOUNTER — Inpatient Hospital Stay
Admission: RE | Admit: 2024-05-27 | Discharge: 2024-05-27 | Disposition: A | Discharge status: Home / Self Care | Source: Ambulatory Visit | Attending: Interventional Radiology

## 2024-05-27 DIAGNOSIS — S32010A Wedge compression fracture of first lumbar vertebra, initial encounter for closed fracture: Secondary | ICD-10-CM

## 2024-05-27 DIAGNOSIS — M8008XA Age-related osteoporosis with current pathological fracture, vertebra(e), initial encounter for fracture: Secondary | ICD-10-CM | POA: Diagnosis not present

## 2024-05-27 HISTORY — PX: IR KYPHO LUMBAR INC FX REDUCE BONE BX UNI/BIL CANNULATION INC/IMAGING: IMG5519

## 2024-05-27 MED ORDER — MIDAZOLAM HCL 2 MG/2ML IJ SOLN
INTRAMUSCULAR | Status: AC | PRN
Start: 2024-05-27 — End: 2024-05-27
  Administered 2024-05-27 (×2): 1 mg via INTRAVENOUS

## 2024-05-27 MED ORDER — SODIUM CHLORIDE 0.9 % IV SOLN: INTRAVENOUS | Status: DC

## 2024-05-27 MED ORDER — LIDOCAINE-EPINEPHRINE 1 %-1:100000 IJ SOLN
10.0000 mL | Freq: Once | INTRAMUSCULAR | Status: AC
  Administered 2024-05-27 (×2): 10 mL via INTRADERMAL

## 2024-05-27 MED ORDER — CEFAZOLIN SODIUM-DEXTROSE 2-4 GM/100ML-% IV SOLN
2.0000 g | INTRAVENOUS | Status: AC
  Administered 2024-05-27 (×2): 2 g via INTRAVENOUS

## 2024-05-27 MED ORDER — FENTANYL CITRATE PF 50 MCG/ML IJ SOSY: 25.0000 ug | PREFILLED_SYRINGE | INTRAMUSCULAR | Status: DC | PRN

## 2024-05-27 MED ORDER — MIDAZOLAM HCL 2 MG/2ML IJ SOLN: 1.0000 mg | INTRAMUSCULAR | Status: DC | PRN

## 2024-05-27 MED ORDER — ACETAMINOPHEN 10 MG/ML IV SOLN
1000.0000 mg | Freq: Once | INTRAVENOUS | Status: AC
  Administered 2024-05-27 (×2): 1000 mg via INTRAVENOUS

## 2024-05-27 MED ORDER — FENTANYL CITRATE (PF) 100 MCG/2ML IJ SOLN
INTRAMUSCULAR | Status: AC | PRN
  Administered 2024-05-27 (×4): 25 ug via INTRAVENOUS

## 2024-05-27 NOTE — Progress Notes (Signed)
 Pt back in nursing recovery area. Pt still drowsy from procedure but will wake up when spoken to. Pt follows commands, talks in complete sentences and has no complaints at this time. Pt will remain in nurses station until discharged by Radiologist.

## 2024-05-28 ENCOUNTER — Encounter

## 2024-05-28 ENCOUNTER — Telehealth: Payer: Self-pay

## 2024-05-30 ENCOUNTER — Other Ambulatory Visit

## 2024-06-03 ENCOUNTER — Other Ambulatory Visit: Payer: Self-pay | Admitting: Interventional Radiology

## 2024-06-03 ENCOUNTER — Telehealth: Payer: Self-pay

## 2024-06-03 DIAGNOSIS — Z9889 Other specified postprocedural states: Secondary | ICD-10-CM

## 2024-06-04 ENCOUNTER — Encounter (INDEPENDENT_AMBULATORY_CARE_PROVIDER_SITE_OTHER): Admitting: *Deleted

## 2024-06-04 ENCOUNTER — Ambulatory Visit
Admission: RE | Admit: 2024-06-04 | Discharge: 2024-06-04 | Disposition: A | Discharge status: Home / Self Care | Source: Ambulatory Visit | Attending: Interventional Radiology | Admitting: Interventional Radiology

## 2024-06-04 DIAGNOSIS — M5127 Other intervertebral disc displacement, lumbosacral region: Secondary | ICD-10-CM | POA: Diagnosis not present

## 2024-06-04 DIAGNOSIS — I4891 Unspecified atrial fibrillation: Secondary | ICD-10-CM | POA: Diagnosis not present

## 2024-06-04 DIAGNOSIS — M47816 Spondylosis without myelopathy or radiculopathy, lumbar region: Secondary | ICD-10-CM | POA: Diagnosis not present

## 2024-06-04 DIAGNOSIS — Z9889 Other specified postprocedural states: Secondary | ICD-10-CM

## 2024-06-04 DIAGNOSIS — Z5181 Encounter for therapeutic drug level monitoring: Secondary | ICD-10-CM | POA: Diagnosis not present

## 2024-06-04 LAB — POCT INR: INR: 1.6 — AB (ref 2.0–3.0)

## 2024-06-04 NOTE — Patient Instructions (Signed)
 Take warfarin 2 tablets tonight then resume 1 tablet daily except 1/2 tablet on Mondays and Thursdays Recheck in 1 week

## 2024-06-04 NOTE — Progress Notes (Signed)
 INR 1.6; Please see anticoagulation encounter

## 2024-06-05 ENCOUNTER — Ambulatory Visit (INDEPENDENT_AMBULATORY_CARE_PROVIDER_SITE_OTHER): Admitting: Gastroenterology

## 2024-06-05 ENCOUNTER — Other Ambulatory Visit: Payer: Self-pay | Admitting: Oncology

## 2024-06-05 ENCOUNTER — Encounter (INDEPENDENT_AMBULATORY_CARE_PROVIDER_SITE_OTHER): Payer: Self-pay | Admitting: Gastroenterology

## 2024-06-05 VITALS — BP 134/72 | HR 78 | Temp 97.5°F | Ht 68.0 in | Wt 145.8 lb

## 2024-06-05 DIAGNOSIS — Z1159 Encounter for screening for other viral diseases: Secondary | ICD-10-CM

## 2024-06-05 DIAGNOSIS — R197 Diarrhea, unspecified: Secondary | ICD-10-CM | POA: Diagnosis not present

## 2024-06-05 DIAGNOSIS — K50019 Crohn's disease of small intestine with unspecified complications: Secondary | ICD-10-CM | POA: Diagnosis not present

## 2024-06-05 DIAGNOSIS — E3409 Other carcinoid syndrome: Secondary | ICD-10-CM | POA: Diagnosis not present

## 2024-06-05 DIAGNOSIS — K50018 Crohn's disease of small intestine with other complication: Secondary | ICD-10-CM

## 2024-06-05 DIAGNOSIS — Z111 Encounter for screening for respiratory tuberculosis: Secondary | ICD-10-CM

## 2024-06-05 DIAGNOSIS — R112 Nausea with vomiting, unspecified: Secondary | ICD-10-CM

## 2024-06-05 MED ORDER — ONDANSETRON HCL 4 MG PO TABS: 4.0000 mg | ORAL_TABLET | Freq: Three times a day (TID) | ORAL | 1 refills | Status: DC | PRN

## 2024-06-05 NOTE — Patient Instructions (Addendum)
 Perform blood and stool workup Continue Entyvio  every 8 weeks Continue with lanreotide injections Continue follow-up with oncology office, I will discuss timing of repeat EGD to assess response to lanreotide

## 2024-06-05 NOTE — H&P (View-Only) (Signed)
 Toribio Fortune, M.D. Gastroenterology & Hepatology [facility] [address] [place], [place] [address]  Primary Care Physician: Atilano Deward ORN, MD [address] [place] [address]  I will communicate my assessment and recommendations to the referring MD via EMR.  Problems: Crohn's disease s/p small bowel resection Well-differentiated duodenal neuroendocrine tumor  History of Present Illness: [name] is a 89 y.o. male with past medical history of Afib on coumadin , CAD, GERD, history of C. difficile, B12 deficiency, hypothyroidism, Crohn's disease s/p small bowel resection, duodenal well-differentiated neuroendocrine tumor, who presents for follow up of Crohn's disease and carcinoid.  The patient was last seen on 03/20/2024. At that time, the patient was scheduled for MR enterography and fecal calprotectin.  Fecal calprotectin was mildly increased at 325.  MR enterography performed 03/29/2024 which did not show any active inflammation at that time.  States he had a recent fall and was found to have an L1 fracture compression. Had a procedure by Dr. Melba recently  - balloon kyphoplasty on 8/12.  Received some antibiotics periprocedurally.  Patient has presented new onset of diarrhea. Stools have been loose  bowel movements, usually early in the morning. He had diarrhea on 05/20/2024, states the day after he took Colace x1 he has presented recurrent diarrhea.  Paitne then had again diarrhea on 8/18, and has had daily diarrhea since then. He is having 2-3 episodes of loose diarrhea since then.    He has had some abdominal soreness after his fall. He has had persistent pressure in his RLQ.  Has had some issues with nausea and would like to have some antiemetic to be prescribed.  The patient denies having any  fever, chills, hematochezia, melena, hematemesis, jaundice, pruritus. Has lost weight since he had a fall.  Most recent  vedolizumab  levels on 04/29/2024 were 17 (adequate), no antibodies were present.  Most recent labs from 05/03/2024 showed CMP within normal limits with creatinine of 1.37 and normal LFTs.  CBC with WBC 11.1 with rest within normal limits.  Previous medications: Currently on Entyvio , Stelara  (thrombocytopenia), Humira  (increased risk of neuroendocrine tumor dissemination).  5 HIAA on 5/28 was 2.1  Chromagranin A on 04/24/2024 was 215 Patient was seen by Dr. Rogers on 04/24/2024.  Patient was started on lanreotide on 03/27/2024 every 4 weeks as the patient is a poor candidate for surgical resection and had eroded surface.  Was recommended to have repeat esophagogastroduodenospy to assess if eroded surface had resolved in the future but timing was difficult to assess.   PET scan: 03/18/24 Focal activity in the second portion duodenum consistent with Well differentiated neuroendocrine tumor. 2. Moderate radiotracer activity in tail the pancreas is also concerning for neuroendocrine tumor. Activity is slightly less than the duodenal lesion. No clear lesion identified on CT. 3. No metastatic adenopathy or mesenteric metastasis. 4. No liver metastasis  Last EGD: 02/2024 - 2 cm hiatal hernia.                           - Normal stomach.                           - Submucosal nodule found in the duodenum - Eroded                            NET.  Last Colonoscopy: 03/23/2016 -  Two small polyps at the recto-sigmoid colon and in the cecum, removed with a cold snare. Resected and retrieved. - Mild diverticulosis in the sigmoid colon. - External and internal hemorrhoids  Past Medical History: Past Medical History:  Diagnosis Date   Anemia    Atrial fibrillation (HCC)    BPH (benign prostatic hyperplasia)    CAD (coronary artery disease)    Catheterization 2004, mild/moderate nonobstructive disease  /   nuclear, 2007, small inferior scar // no ischemia   Crohn's disease (HCC)    Elevated PSA    GERD  (gastroesophageal reflux disease)    History of kidney stones    History of pneumonia    Hypothyroidism    Mitral regurgitation    Pneumonia    SBO (small bowel obstruction) (HCC)    Urinary retention    Wears glasses     Past Surgical History: Past Surgical History:  Procedure Laterality Date   AGILE CAPSULE  10/18/2011   Procedure: AGILE CAPSULE;  Surgeon: Claudis RAYMOND Rivet, MD;  [facility]: [facility];  Service: Endoscopy;  Laterality: N/A;  730   BIOPSY  10/05/2022   Procedure: BIOPSY;  Surgeon: Eartha Angelia Sieving, MD;  [facility]: [facility];  Service: Gastroenterology;;   BIOPSY  01/02/2023   Procedure: BIOPSY;  Surgeon: Eartha Angelia Sieving, MD;  [facility];  Service: Gastroenterology;;   CARDIAC CATHETERIZATION  2004   CATARACT EXTRACTION W/PHACO Right 06/27/2021   Procedure: CATARACT EXTRACTION PHACO AND INTRAOCULAR LENS PLACEMENT (IOC);  Surgeon: Harrie Agent, MD;  Location: [facility];  Service: Ophthalmology;  Laterality: Right;  CDE 21.98   CATARACT EXTRACTION W/PHACO Left 07/11/2021   Procedure: CATARACT EXTRACTION PHACO AND INTRAOCULAR LENS PLACEMENT LEFT EYE;  Surgeon: Harrie Agent, MD;  Location: [facility];  Service: Ophthalmology;  Laterality: Left;  CDE=10.42   CHOLECYSTECTOMY  2010   Dr. DeMason   COLONOSCOPY  2008   DeMason   COLONOSCOPY N/A 03/23/2016   Procedure: COLONOSCOPY;  Surgeon: Claudis RAYMOND Rivet, MD;  [facility]: [facility];  Service: Endoscopy;  Laterality: N/A;  1:00   CYSTOSCOPY WITH INSERTION OF UROLIFT     ESOPHAGEAL BRUSHING  10/05/2022   Procedure: ESOPHAGEAL BRUSHING;  Surgeon: Eartha Angelia Sieving, MD;  [facility];  Service: Gastroenterology;;   ESOPHAGOGASTRODUODENOSCOPY  11/08/2011   Procedure: ESOPHAGOGASTRODUODENOSCOPY (EGD);  Surgeon: Claudis RAYMOND Rivet, MD;  [facility]: [facility];  Service: Endoscopy;  Laterality: N/A;  300   ESOPHAGOGASTRODUODENOSCOPY N/A 02/15/2024   Procedure: EGD  (ESOPHAGOGASTRODUODENOSCOPY);  Surgeon: Eartha Angelia, Sieving, MD;  [facility]: [facility];  Service: Gastroenterology;  Laterality: N/A;  2:00PM;ASA 3   ESOPHAGOGASTRODUODENOSCOPY (EGD) WITH PROPOFOL  N/A 10/05/2022   Procedure: ESOPHAGOGASTRODUODENOSCOPY (EGD) WITH PROPOFOL ;  Surgeon: Eartha Angelia Sieving, MD;  [facility];  Service: Gastroenterology;  Laterality: N/A;   ESOPHAGOGASTRODUODENOSCOPY (EGD) WITH PROPOFOL  N/A 01/02/2023   Procedure: ESOPHAGOGASTRODUODENOSCOPY (EGD) WITH PROPOFOL ;  Surgeon: Eartha Angelia Sieving, MD;  [facility];  Service: Gastroenterology;  Laterality: N/A;  1:30 pm, asa 3   HEMOSTASIS CLIP PLACEMENT  10/05/2022   Procedure: HEMOSTASIS CLIP PLACEMENT;  Surgeon: Eartha Angelia Sieving, MD;  [facility]: [facility];  Service: Gastroenterology;;   IR KYPHO LUMBAR INC FX REDUCE BONE BX UNI/BIL CANNULATION INC/IMAGING  05/27/2024   IR RADIOLOGIST EVAL & MGMT  05/14/2024   POLYPECTOMY  03/23/2016   Procedure: POLYPECTOMY;  Surgeon: Claudis RAYMOND Rivet, MD;  [facility];  Service: Endoscopy;;  Cecal polyp removed  via cold forceps recto-sigmoid polyp removed via cold snare   POLYPECTOMY  10/05/2022   Procedure: POLYPECTOMY;  Surgeon: Eartha Angelia Sieving, MD;  [facility];  Service: Gastroenterology;;   TRANSURETHRAL RESECTION OF PROSTATE N/A 04/29/2020   Procedure: TRANSURETHRAL RESECTION OF THE PROSTATE (TURP);  Surgeon: Matilda Senior, MD;  Location: [facility];  Service: Urology;  Laterality: N/A;  90 MINS    Family History: Family History  Problem Relation Age of Onset   Colon cancer Mother    Heart disease Father    Stroke Brother    Healthy Daughter     Social History: Social History   Tobacco Use  Smoking Status Never   Passive exposure: Never  Smokeless Tobacco Never   Social History   Substance and Sexual Activity  Alcohol  Use No   Alcohol /week: 0.0 standard drinks  of alcohol    Social History   Substance and Sexual Activity  Drug Use No    Allergies: No Known Allergies  Medications: Current Outpatient Medications  Medication Sig Dispense Refill   acetaminophen  (TYLENOL ) 500 MG tablet Take 1,000 mg by mouth every 6 (six) hours as needed for mild pain (pain score 1-3) or moderate pain (pain score 4-6).     CALCIUM  PO Take 300 mg by mouth daily after lunch.     Cholecalciferol (VITAMIN D3) 50 MCG (2000 UT) TABS Take 2,000 Units by mouth daily.     cyanocobalamin  (VITAMIN B12) 1000 MCG tablet Take 1,000 mcg by mouth daily. (Patient taking differently: Take 1,000 mcg by mouth daily. Every other day)     diltiazem  (CARDIZEM  CD) 360 MG 24 hr capsule TAKE ONE CAPSULE BY MOUTH DAILY 90 capsule 1   levothyroxine  (SYNTHROID , LEVOTHROID) 75 MCG tablet Take 75 mcg by mouth daily before breakfast.     magnesium  oxide (MAG-OX) 400 (240 Mg) MG tablet Take 400 mg by mouth 2 (two) times daily.     melatonin 3 MG TABS tablet Take 6 mg by mouth at bedtime.     metoprolol  succinate (TOPROL -XL) 50 MG 24 hr tablet TAKE 1 TABLET BY MOUTH DAILY (EMERGENCY REFILL FAXED DR.) 90 tablet 1   nitroGLYCERIN  (NITROSTAT ) 0.4 MG SL tablet Place 0.4 mg under the tongue every 5 (five) minutes as needed for chest pain. May repeat for up to 3 doses.     omeprazole  (PRILOSEC) 40 MG capsule TAKE ONE CAPSULE BY MOUTH EVERY DAY 90 capsule 1   Probiotic Product (ALIGN) 4 MG CAPS Take 4 mg by mouth in the morning.     traZODone  (DESYREL ) 50 MG tablet Take 50 mg by mouth at bedtime.     Vedolizumab  (ENTYVIO  IV) Inject into the vein. Every 8 weeks.     warfarin (COUMADIN ) 2.5 MG tablet TAKE 1 TO 1 & 1/2 TABLETS BY MOUTH DAILY OR AS DIRECTED by coumadin  clinic (Patient taking differently: Take 2.5 mg by mouth See admin instructions. 1.5 tablets on Mondays. 1 tablet on all other days) 45 tablet 5   No current facility-administered medications for this visit.    Review of Systems: GENERAL:  negative for malaise, night sweats HEENT: No changes in hearing or vision, no nose bleeds or other nasal problems. NECK: Negative for lumps, goiter, pain and significant neck swelling RESPIRATORY: Negative for cough, wheezing CARDIOVASCULAR: Negative for chest pain, leg swelling, palpitations, orthopnea GI: SEE HPI MUSCULOSKELETAL: Negative for joint pain or swelling, back pain, and muscle pain. SKIN: Negative for lesions, rash PSYCH: Negative for sleep disturbance,  mood disorder and recent psychosocial stressors. HEMATOLOGY Negative for prolonged bleeding, bruising easily, and swollen nodes. ENDOCRINE: Negative for cold or heat intolerance, polyuria, polydipsia and goiter. NEURO: negative for tremor, gait imbalance, syncope and seizures. The remainder of the review of systems is noncontributory.   Physical Exam: BP 134/72 (BP Location: Left Arm, Patient Position: Sitting, Cuff Size: Normal)   Pulse 78   Temp (!) 97.5 F (36.4 C) (Temporal)   Ht 5' 8 (1.727 m)   Wt 145 lb 12.8 oz (66.1 kg)   BMI 22.17 kg/m  GENERAL: The patient is AO x3, in no acute distress. Sitting in wheelchair HEENT: Head is normocephalic and atraumatic. EOMI are intact. Mouth is well hydrated and without lesions. NECK: Supple. No masses LUNGS: Clear to auscultation. No presence of rhonchi/wheezing/rales. Adequate chest expansion HEART: RRR, normal s1 and s2. ABDOMEN: Soft, nontender, no guarding, no peritoneal signs, and nondistended. BS +. No masses. EXTREMITIES: Without any cyanosis, clubbing, rash, lesions or edema. NEUROLOGIC: AOx3, no focal motor deficit. SKIN: no jaundice, no rashes  Imaging/Labs: as above  I personally reviewed and interpreted the available labs, imaging and endoscopic files.  Impression and Plan: [name] is a 89 y.o. male with past medical history of Afib on coumadin , CAD, GERD, history of C. difficile, B12 deficiency, hypothyroidism, Crohn's disease s/p small bowel  resection, duodenal well-differentiated neuroendocrine tumor, who presents for follow up of Crohn's disease and carcinoid.  Patient was presented adequate control of his bowel movements with Entyvio  until recently when he presented to the intermittent diarrhea.  Has not presented any other significant associated symptoms.  Discussed the importance of ruling out infectious etiology with stool based testing, We will also check fecal calprotectin today.  I will also obtain surveillance labs for his Crohn's disease.  Regarding his duodenal carcinoid, he is currently receiving lanreotide given presence of eroding mucosa in the duodenum during his last EGD.  I will reach his oncology office to discuss the best timing for repeat EGD.  For now he will need to continue lanreotide injections.  - Check C. Diff, GI path, ova and parasite in stool, hepatitis/tuberculosis screening -Continue Entyvio  every 8 weeks -Continue with lanreotide injections -Continue follow-up with oncology office, I will discuss timing of repeat EGD to assess response to lanreotide  All questions were answered.      Toribio Fortune, MD Gastroenterology and Hepatology [facility]

## 2024-06-05 NOTE — Progress Notes (Signed)
 Toribio Fortune, M.D. Gastroenterology & Hepatology [facility] [address] [place], [place] [address]  Primary Care Physician: Atilano Deward ORN, MD [address] [place] [address]  I will communicate my assessment and recommendations to the referring MD via EMR.  Problems: Crohn's disease s/p small bowel resection Well-differentiated duodenal neuroendocrine tumor  History of Present Illness: [name] is a 89 y.o. male with past medical history of Afib on coumadin , CAD, GERD, history of C. difficile, B12 deficiency, hypothyroidism, Crohn's disease s/p small bowel resection, duodenal well-differentiated neuroendocrine tumor, who presents for follow up of Crohn's disease and carcinoid.  The patient was last seen on 03/20/2024. At that time, the patient was scheduled for MR enterography and fecal calprotectin.  Fecal calprotectin was mildly increased at 325.  MR enterography performed 03/29/2024 which did not show any active inflammation at that time.  States he had a recent fall and was found to have an L1 fracture compression. Had a procedure by Dr. Melba recently  - balloon kyphoplasty on 8/12.  Received some antibiotics periprocedurally.  Patient has presented new onset of diarrhea. Stools have been loose  bowel movements, usually early in the morning. He had diarrhea on 05/20/2024, states the day after he took Colace x1 he has presented recurrent diarrhea.  Paitne then had again diarrhea on 8/18, and has had daily diarrhea since then. He is having 2-3 episodes of loose diarrhea since then.    He has had some abdominal soreness after his fall. He has had persistent pressure in his RLQ.  Has had some issues with nausea and would like to have some antiemetic to be prescribed.  The patient denies having any  fever, chills, hematochezia, melena, hematemesis, jaundice, pruritus. Has lost weight since he had a fall.  Most recent  vedolizumab  levels on 04/29/2024 were 17 (adequate), no antibodies were present.  Most recent labs from 05/03/2024 showed CMP within normal limits with creatinine of 1.37 and normal LFTs.  CBC with WBC 11.1 with rest within normal limits.  Previous medications: Currently on Entyvio , Stelara  (thrombocytopenia), Humira  (increased risk of neuroendocrine tumor dissemination).  5 HIAA on 5/28 was 2.1  Chromagranin A on 04/24/2024 was 215 Patient was seen by Dr. Rogers on 04/24/2024.  Patient was started on lanreotide on 03/27/2024 every 4 weeks as the patient is a poor candidate for surgical resection and had eroded surface.  Was recommended to have repeat esophagogastroduodenospy to assess if eroded surface had resolved in the future but timing was difficult to assess.   PET scan: 03/18/24 Focal activity in the second portion duodenum consistent with Well differentiated neuroendocrine tumor. 2. Moderate radiotracer activity in tail the pancreas is also concerning for neuroendocrine tumor. Activity is slightly less than the duodenal lesion. No clear lesion identified on CT. 3. No metastatic adenopathy or mesenteric metastasis. 4. No liver metastasis  Last EGD: 02/2024 - 2 cm hiatal hernia.                           - Normal stomach.                           - Submucosal nodule found in the duodenum - Eroded                            NET.  Last Colonoscopy: 03/23/2016 -  Two small polyps at the recto-sigmoid colon and in the cecum, removed with a cold snare. Resected and retrieved. - Mild diverticulosis in the sigmoid colon. - External and internal hemorrhoids  Past Medical History: Past Medical History:  Diagnosis Date   Anemia    Atrial fibrillation (HCC)    BPH (benign prostatic hyperplasia)    CAD (coronary artery disease)    Catheterization 2004, mild/moderate nonobstructive disease  /   nuclear, 2007, small inferior scar // no ischemia   Crohn's disease (HCC)    Elevated PSA    GERD  (gastroesophageal reflux disease)    History of kidney stones    History of pneumonia    Hypothyroidism    Mitral regurgitation    Pneumonia    SBO (small bowel obstruction) (HCC)    Urinary retention    Wears glasses     Past Surgical History: Past Surgical History:  Procedure Laterality Date   AGILE CAPSULE  10/18/2011   Procedure: AGILE CAPSULE;  Surgeon: Claudis RAYMOND Rivet, MD;  [facility];  Service: Endoscopy;  Laterality: N/A;  730   BIOPSY  10/05/2022   Procedure: BIOPSY;  Surgeon: Eartha Angelia Sieving, MD;  [facility];  Service: Gastroenterology;;   BIOPSY  01/02/2023   Procedure: BIOPSY;  Surgeon: Eartha Angelia Sieving, MD;  [facility];  Service: Gastroenterology;;   CARDIAC CATHETERIZATION  2004   CATARACT EXTRACTION W/PHACO Right 06/27/2021   Procedure: CATARACT EXTRACTION PHACO AND INTRAOCULAR LENS PLACEMENT (IOC);  Surgeon: Harrie Agent, MD;  Location: [facility];  Service: Ophthalmology;  Laterality: Right;  CDE 21.98   CATARACT EXTRACTION W/PHACO Left 07/11/2021   Procedure: CATARACT EXTRACTION PHACO AND INTRAOCULAR LENS PLACEMENT LEFT EYE;  Surgeon: Harrie Agent, MD;  Location: [facility];  Service: Ophthalmology;  Laterality: Left;  CDE=10.42   CHOLECYSTECTOMY  2010   Dr. DeMason   COLONOSCOPY  2008   DeMason   COLONOSCOPY N/A 03/23/2016   Procedure: COLONOSCOPY;  Surgeon: Claudis RAYMOND Rivet, MD;  [facility];  Service: Endoscopy;  Laterality: N/A;  1:00   CYSTOSCOPY WITH INSERTION OF UROLIFT     ESOPHAGEAL BRUSHING  10/05/2022   Procedure: ESOPHAGEAL BRUSHING;  Surgeon: Eartha Angelia Sieving, MD;  [facility];  Service: Gastroenterology;;   ESOPHAGOGASTRODUODENOSCOPY  11/08/2011   Procedure: ESOPHAGOGASTRODUODENOSCOPY (EGD);  Surgeon: Claudis RAYMOND Rivet, MD;  [facility];  Service: Endoscopy;  Laterality: N/A;  300   ESOPHAGOGASTRODUODENOSCOPY N/A 02/15/2024   Procedure: EGD  (ESOPHAGOGASTRODUODENOSCOPY);  Surgeon: Eartha Angelia, Sieving, MD;  [facility];  Service: Gastroenterology;  Laterality: N/A;  2:00PM;ASA 3   ESOPHAGOGASTRODUODENOSCOPY (EGD) WITH PROPOFOL  N/A 10/05/2022   Procedure: ESOPHAGOGASTRODUODENOSCOPY (EGD) WITH PROPOFOL ;  Surgeon: Eartha Angelia Sieving, MD;  [facility];  Service: Gastroenterology;  Laterality: N/A;   ESOPHAGOGASTRODUODENOSCOPY (EGD) WITH PROPOFOL  N/A 01/02/2023   Procedure: ESOPHAGOGASTRODUODENOSCOPY (EGD) WITH PROPOFOL ;  Surgeon: Eartha Angelia Sieving, MD;  [facility];  Service: Gastroenterology;  Laterality: N/A;  1:30 pm, asa 3   HEMOSTASIS CLIP PLACEMENT  10/05/2022   Procedure: HEMOSTASIS CLIP PLACEMENT;  Surgeon: Eartha Angelia Sieving, MD;  [facility];  Service: Gastroenterology;;   IR KYPHO LUMBAR INC FX REDUCE BONE BX UNI/BIL CANNULATION INC/IMAGING  05/27/2024   IR RADIOLOGIST EVAL & MGMT  05/14/2024   POLYPECTOMY  03/23/2016   Procedure: POLYPECTOMY;  Surgeon: Claudis RAYMOND Rivet, MD;  [facility];  Service: Endoscopy;;  Cecal polyp removed  via cold forceps recto-sigmoid polyp removed via cold snare   POLYPECTOMY  10/05/2022   Procedure: POLYPECTOMY;  Surgeon: Eartha Angelia Sieving, MD;  [facility];  Service: Gastroenterology;;   TRANSURETHRAL RESECTION OF PROSTATE N/A 04/29/2020   Procedure: TRANSURETHRAL RESECTION OF THE PROSTATE (TURP);  Surgeon: Matilda Senior, MD;  Location: [facility];  Service: Urology;  Laterality: N/A;  90 MINS    Family History: Family History  Problem Relation Age of Onset   Colon cancer Mother    Heart disease Father    Stroke Brother    Healthy Daughter     Social History: Social History   Tobacco Use  Smoking Status Never   Passive exposure: Never  Smokeless Tobacco Never   Social History   Substance and Sexual Activity  Alcohol  Use No   Alcohol /week: 0.0 standard drinks  of alcohol    Social History   Substance and Sexual Activity  Drug Use No    Allergies: No Known Allergies  Medications: Current Outpatient Medications  Medication Sig Dispense Refill   acetaminophen  (TYLENOL ) 500 MG tablet Take 1,000 mg by mouth every 6 (six) hours as needed for mild pain (pain score 1-3) or moderate pain (pain score 4-6).     CALCIUM  PO Take 300 mg by mouth daily after lunch.     Cholecalciferol (VITAMIN D3) 50 MCG (2000 UT) TABS Take 2,000 Units by mouth daily.     cyanocobalamin  (VITAMIN B12) 1000 MCG tablet Take 1,000 mcg by mouth daily. (Patient taking differently: Take 1,000 mcg by mouth daily. Every other day)     diltiazem  (CARDIZEM  CD) 360 MG 24 hr capsule TAKE ONE CAPSULE BY MOUTH DAILY 90 capsule 1   levothyroxine  (SYNTHROID , LEVOTHROID) 75 MCG tablet Take 75 mcg by mouth daily before breakfast.     magnesium  oxide (MAG-OX) 400 (240 Mg) MG tablet Take 400 mg by mouth 2 (two) times daily.     melatonin 3 MG TABS tablet Take 6 mg by mouth at bedtime.     metoprolol  succinate (TOPROL -XL) 50 MG 24 hr tablet TAKE 1 TABLET BY MOUTH DAILY (EMERGENCY REFILL FAXED DR.) 90 tablet 1   nitroGLYCERIN  (NITROSTAT ) 0.4 MG SL tablet Place 0.4 mg under the tongue every 5 (five) minutes as needed for chest pain. May repeat for up to 3 doses.     omeprazole  (PRILOSEC) 40 MG capsule TAKE ONE CAPSULE BY MOUTH EVERY DAY 90 capsule 1   Probiotic Product (ALIGN) 4 MG CAPS Take 4 mg by mouth in the morning.     traZODone  (DESYREL ) 50 MG tablet Take 50 mg by mouth at bedtime.     Vedolizumab  (ENTYVIO  IV) Inject into the vein. Every 8 weeks.     warfarin (COUMADIN ) 2.5 MG tablet TAKE 1 TO 1 & 1/2 TABLETS BY MOUTH DAILY OR AS DIRECTED by coumadin  clinic (Patient taking differently: Take 2.5 mg by mouth See admin instructions. 1.5 tablets on Mondays. 1 tablet on all other days) 45 tablet 5   No current facility-administered medications for this visit.    Review of Systems: GENERAL:  negative for malaise, night sweats HEENT: No changes in hearing or vision, no nose bleeds or other nasal problems. NECK: Negative for lumps, goiter, pain and significant neck swelling RESPIRATORY: Negative for cough, wheezing CARDIOVASCULAR: Negative for chest pain, leg swelling, palpitations, orthopnea GI: SEE HPI MUSCULOSKELETAL: Negative for joint pain or swelling, back pain, and muscle pain. SKIN: Negative for lesions, rash PSYCH: Negative for sleep disturbance,  mood disorder and recent psychosocial stressors. HEMATOLOGY Negative for prolonged bleeding, bruising easily, and swollen nodes. ENDOCRINE: Negative for cold or heat intolerance, polyuria, polydipsia and goiter. NEURO: negative for tremor, gait imbalance, syncope and seizures. The remainder of the review of systems is noncontributory.   Physical Exam: BP 134/72 (BP Location: Left Arm, Patient Position: Sitting, Cuff Size: Normal)   Pulse 78   Temp (!) 97.5 F (36.4 C) (Temporal)   Ht 5' 8 (1.727 m)   Wt 145 lb 12.8 oz (66.1 kg)   BMI 22.17 kg/m  GENERAL: The patient is AO x3, in no acute distress. Sitting in wheelchair HEENT: Head is normocephalic and atraumatic. EOMI are intact. Mouth is well hydrated and without lesions. NECK: Supple. No masses LUNGS: Clear to auscultation. No presence of rhonchi/wheezing/rales. Adequate chest expansion HEART: RRR, normal s1 and s2. ABDOMEN: Soft, nontender, no guarding, no peritoneal signs, and nondistended. BS +. No masses. EXTREMITIES: Without any cyanosis, clubbing, rash, lesions or edema. NEUROLOGIC: AOx3, no focal motor deficit. SKIN: no jaundice, no rashes  Imaging/Labs: as above  I personally reviewed and interpreted the available labs, imaging and endoscopic files.  Impression and Plan: [name] is a 89 y.o. male with past medical history of Afib on coumadin , CAD, GERD, history of C. difficile, B12 deficiency, hypothyroidism, Crohn's disease s/p small bowel  resection, duodenal well-differentiated neuroendocrine tumor, who presents for follow up of Crohn's disease and carcinoid.  Patient was presented adequate control of his bowel movements with Entyvio  until recently when he presented to the intermittent diarrhea.  Has not presented any other significant associated symptoms.  Discussed the importance of ruling out infectious etiology with stool based testing, We will also check fecal calprotectin today.  I will also obtain surveillance labs for his Crohn's disease.  Regarding his duodenal carcinoid, he is currently receiving lanreotide given presence of eroding mucosa in the duodenum during his last EGD.  I will reach his oncology office to discuss the best timing for repeat EGD.  For now he will need to continue lanreotide injections.  - Check C. Diff, GI path, ova and parasite in stool, hepatitis/tuberculosis screening -Continue Entyvio  every 8 weeks -Continue with lanreotide injections -Continue follow-up with oncology office, I will discuss timing of repeat EGD to assess response to lanreotide  All questions were answered.      Toribio Fortune, MD Gastroenterology and Hepatology [facility]

## 2024-06-06 ENCOUNTER — Telehealth: Payer: Self-pay | Admitting: Gastroenterology

## 2024-06-06 DIAGNOSIS — K50018 Crohn's disease of small intestine with other complication: Secondary | ICD-10-CM | POA: Diagnosis not present

## 2024-06-06 NOTE — Telephone Encounter (Signed)
 I discussed the case with Dr. Davonna, who is stated that it will be adequate to proceed with a esophagogastroduodenospy to assess response to lanreotide at this moment.  If there was presence of persistent ulceration of the duodenal neuroendocrine tumor, patient will need to continue with lanreotide.  I discussed this with the patient and his wife who agreed and understood.  Will proceed with endoscopy while on anticoagulation.  Hi Mindy/Tammy,   Can you please schedule a esophagogastroduodenospy in next available? Dx: Duodenal neuroendocrine tumor. Room: 3.  No need to stop warfarin.  Thanks,  Toribio Fortune, MD Gastroenterology and Hepatology [facility]

## 2024-06-07 NOTE — Addendum Note (Signed)
 Addended by: CASTANEDA MAYORGA, Yari Szeliga on: 06/07/2024 03:14 PM   Modules accepted: Level of Service

## 2024-06-08 LAB — OVA AND PARASITE EXAMINATION

## 2024-06-09 ENCOUNTER — Encounter: Payer: Self-pay | Admitting: *Deleted

## 2024-06-09 ENCOUNTER — Ambulatory Visit: Payer: Self-pay | Admitting: Gastroenterology

## 2024-06-09 NOTE — Telephone Encounter (Signed)
 Pt has been scheduled for 06/19/24. Instructions sent via mychart.

## 2024-06-10 ENCOUNTER — Other Ambulatory Visit: Payer: Self-pay | Admitting: Interventional Radiology

## 2024-06-10 ENCOUNTER — Ambulatory Visit: Attending: Cardiology | Admitting: *Deleted

## 2024-06-10 DIAGNOSIS — Z5181 Encounter for therapeutic drug level monitoring: Secondary | ICD-10-CM | POA: Diagnosis not present

## 2024-06-10 DIAGNOSIS — I4891 Unspecified atrial fibrillation: Secondary | ICD-10-CM | POA: Diagnosis not present

## 2024-06-10 DIAGNOSIS — Z9889 Other specified postprocedural states: Secondary | ICD-10-CM

## 2024-06-10 LAB — GI PROFILE, STOOL, PCR

## 2024-06-10 LAB — POCT INR: INR: 2.5 (ref 2.0–3.0)

## 2024-06-10 LAB — CALPROTECTIN, FECAL: Calprotectin, Fecal: 179 ug/g — AB (ref 0–120)

## 2024-06-10 NOTE — Progress Notes (Signed)
 INR 2.5. Please see anticoagulation encounter

## 2024-06-10 NOTE — Patient Instructions (Signed)
 Continue warfarin 1 tablet daily except 1/2 tablet on Mondays and Thursdays Recheck in 3 weeks Scheduled for EDG on 06/19/24 on warfarin

## 2024-06-11 DIAGNOSIS — K5090 Crohn's disease, unspecified, without complications: Secondary | ICD-10-CM | POA: Diagnosis not present

## 2024-06-11 DIAGNOSIS — E039 Hypothyroidism, unspecified: Secondary | ICD-10-CM | POA: Diagnosis not present

## 2024-06-11 DIAGNOSIS — E782 Mixed hyperlipidemia: Secondary | ICD-10-CM | POA: Diagnosis not present

## 2024-06-11 DIAGNOSIS — H6123 Impacted cerumen, bilateral: Secondary | ICD-10-CM | POA: Diagnosis not present

## 2024-06-11 DIAGNOSIS — I10 Essential (primary) hypertension: Secondary | ICD-10-CM | POA: Diagnosis not present

## 2024-06-11 DIAGNOSIS — F5104 Psychophysiologic insomnia: Secondary | ICD-10-CM | POA: Diagnosis not present

## 2024-06-11 DIAGNOSIS — W19XXXA Unspecified fall, initial encounter: Secondary | ICD-10-CM | POA: Diagnosis not present

## 2024-06-11 DIAGNOSIS — J309 Allergic rhinitis, unspecified: Secondary | ICD-10-CM | POA: Diagnosis not present

## 2024-06-11 DIAGNOSIS — I4891 Unspecified atrial fibrillation: Secondary | ICD-10-CM | POA: Diagnosis not present

## 2024-06-11 DIAGNOSIS — K2100 Gastro-esophageal reflux disease with esophagitis, without bleeding: Secondary | ICD-10-CM | POA: Diagnosis not present

## 2024-06-11 DIAGNOSIS — S32010S Wedge compression fracture of first lumbar vertebra, sequela: Secondary | ICD-10-CM | POA: Diagnosis not present

## 2024-06-12 ENCOUNTER — Ambulatory Visit
Admission: RE | Admit: 2024-06-12 | Discharge: 2024-06-12 | Disposition: A | Discharge status: Home / Self Care | Source: Ambulatory Visit | Attending: Interventional Radiology | Admitting: Interventional Radiology

## 2024-06-12 DIAGNOSIS — Z9889 Other specified postprocedural states: Secondary | ICD-10-CM | POA: Diagnosis not present

## 2024-06-12 DIAGNOSIS — M47817 Spondylosis without myelopathy or radiculopathy, lumbosacral region: Secondary | ICD-10-CM | POA: Diagnosis not present

## 2024-06-12 HISTORY — PX: IR RADIOLOGIST EVAL & MGMT: IMG5224

## 2024-06-12 NOTE — Progress Notes (Signed)
 Chief Complaint: Patient was consulted remotely today (TeleHealth) for persistent low back pain at the request of Granville Whitefield K.    Referring Physician(s): Reagan Klemz K  History of Present Illness: [name] is a 89 y.o. male  While his fracture pain is significantly better, he continues to have issues with low back and specifically right posterior hip pain particularly when standing.  He can only stand for a few minutes before he has to sit down to rest.  Sitting down completely relieves his symptoms. He notes his symptoms are particularly bad if he tries to do something at the sink or at the table where he has to bend a slightly forward.  MRI of the lumbar spine was obtained on 06/04/2024.  No evidence of new insufficiency fracture.  He does have significant spondylosis and multilevel foraminal stenosis greatest on the left at L4-L5 and bilaterally at L5-S1 including severe stenosis on the right at this level.  Further, he has facet hypertrophy and sclerosis on the right at L5-S1 but no evidence of joint effusion or active edema.  Past Medical History:  Diagnosis Date   Anemia    Atrial fibrillation (HCC)    BPH (benign prostatic hyperplasia)    CAD (coronary artery disease)    Catheterization 2004, mild/moderate nonobstructive disease  /   nuclear, 2007, small inferior scar // no ischemia   Crohn's disease (HCC)    Elevated PSA    GERD (gastroesophageal reflux disease)    History of kidney stones    History of pneumonia    Hypothyroidism    Mitral regurgitation    Pneumonia    SBO (small bowel obstruction) (HCC)    Urinary retention    Wears glasses     Past Surgical History:  Procedure Laterality Date   AGILE CAPSULE  10/18/2011   Procedure: AGILE CAPSULE;  Surgeon: Claudis RAYMOND Rivet, MD;  [facility];  Service: Endoscopy;  Laterality: N/A;  730   BIOPSY  10/05/2022   Procedure: BIOPSY;  Surgeon: Eartha Angelia Sieving, MD;  [facility];  Service: Gastroenterology;;   BIOPSY  01/02/2023   Procedure: BIOPSY;  Surgeon: Eartha Angelia Sieving, MD;  [facility];  Service: Gastroenterology;;   CARDIAC CATHETERIZATION  2004   CATARACT EXTRACTION W/PHACO Right 06/27/2021   Procedure: CATARACT EXTRACTION PHACO AND INTRAOCULAR LENS PLACEMENT (IOC);  Surgeon: Harrie Agent, MD;  Location: [facility];  Service: Ophthalmology;  Laterality: Right;  CDE 21.98   CATARACT EXTRACTION W/PHACO Left 07/11/2021   Procedure: CATARACT EXTRACTION PHACO AND INTRAOCULAR LENS PLACEMENT LEFT EYE;  Surgeon: Harrie Agent, MD;  [facility]: [facility];  Service: Ophthalmology;  Laterality: Left;  CDE=10.42   CHOLECYSTECTOMY  2010   Dr. DeMason   COLONOSCOPY  2008   DeMason   COLONOSCOPY N/A 03/23/2016   Procedure: COLONOSCOPY;  Surgeon: Claudis RAYMOND Rivet, MD;  [facility];  Service: Endoscopy;  Laterality: N/A;  1:00   CYSTOSCOPY WITH INSERTION OF UROLIFT     ESOPHAGEAL BRUSHING  10/05/2022   Procedure: ESOPHAGEAL BRUSHING;  Surgeon: Eartha Angelia Sieving, MD;  [facility];  Service: Gastroenterology;;   ESOPHAGOGASTRODUODENOSCOPY  11/08/2011   Procedure: ESOPHAGOGASTRODUODENOSCOPY (EGD);  Surgeon: Claudis RAYMOND Rivet, MD;  [facility];  Service: Endoscopy;  Laterality: N/A;  300   ESOPHAGOGASTRODUODENOSCOPY N/A 02/15/2024   Procedure: EGD (ESOPHAGOGASTRODUODENOSCOPY);  Surgeon: Eartha Angelia, Sieving, MD;  [facility];  Service: Gastroenterology;  Laterality: N/A;  2:00PM;ASA  3   ESOPHAGOGASTRODUODENOSCOPY (EGD) WITH PROPOFOL  N/A 10/05/2022   Procedure: ESOPHAGOGASTRODUODENOSCOPY (EGD) WITH PROPOFOL ;  Surgeon: Eartha Angelia Sieving, MD;  [facility];  Service: Gastroenterology;  Laterality: N/A;   ESOPHAGOGASTRODUODENOSCOPY (EGD) WITH PROPOFOL  N/A 01/02/2023   Procedure: ESOPHAGOGASTRODUODENOSCOPY (EGD) WITH PROPOFOL ;  Surgeon: Eartha Angelia Sieving, MD;  [facility];  Service: Gastroenterology;  Laterality: N/A;  1:30 pm, asa 3   HEMOSTASIS CLIP PLACEMENT  10/05/2022   Procedure: HEMOSTASIS CLIP PLACEMENT;  Surgeon: Eartha Angelia Sieving, MD;  [facility];  Service: Gastroenterology;;   IR KYPHO LUMBAR INC FX REDUCE BONE BX UNI/BIL CANNULATION INC/IMAGING  05/27/2024   IR RADIOLOGIST EVAL & MGMT  05/14/2024   POLYPECTOMY  03/23/2016   Procedure: POLYPECTOMY;  Surgeon: Claudis RAYMOND Rivet, MD;  [facility];  Service: Endoscopy;;  Cecal polyp removed via cold forceps recto-sigmoid polyp removed via cold snare   POLYPECTOMY  10/05/2022   Procedure: POLYPECTOMY;  Surgeon: Eartha Angelia Sieving, MD;  [facility];  Service: Gastroenterology;;   TRANSURETHRAL RESECTION OF PROSTATE N/A 04/29/2020   Procedure: TRANSURETHRAL RESECTION OF THE PROSTATE (TURP);  Surgeon: Matilda Senior, MD;  Location: [facility];  Service: Urology;  Laterality: N/A;  90 MINS    Allergies: Patient has no known allergies.  Medications: Prior to Admission medications   Medication Sig Start Date End Date Taking? Authorizing Provider  acetaminophen  (TYLENOL ) 500 MG tablet Take 1,000 mg by mouth every 6 (six) hours as needed for mild pain (pain score 1-3) or moderate pain (pain score 4-6).    [provider]  CALCIUM  PO Take 300 mg by mouth daily after lunch.    [provider]  Cholecalciferol (VITAMIN D3) 50 MCG (2000 UT) TABS Take 2,000 Units by mouth daily.    [provider]  cyanocobalamin  (VITAMIN B12) 1000 MCG tablet Take 1,000 mcg by mouth daily. Patient taking differently: Take 1,000 mcg by mouth daily. Every other day    [provider]  diltiazem  (CARDIZEM  CD) 360 MG 24 hr capsule TAKE ONE CAPSULE BY MOUTH DAILY 05/21/24   Debera Jayson MATSU, MD  levothyroxine  (SYNTHROID , LEVOTHROID) 75 MCG tablet Take 75 mcg by mouth daily before breakfast.    [provider]  magnesium   oxide (MAG-OX) 400 (240 Mg) MG tablet Take 400 mg by mouth 2 (two) times daily.    [provider]  melatonin 3 MG TABS tablet Take 6 mg by mouth at bedtime.    [provider]  metoprolol  succinate (TOPROL -XL) 50 MG 24 hr tablet TAKE 1 TABLET BY MOUTH DAILY (EMERGENCY REFILL FAXED DR.) 05/21/24   Debera Jayson MATSU, MD  nitroGLYCERIN  (NITROSTAT ) 0.4 MG SL tablet Place 0.4 mg under the tongue every 5 (five) minutes as needed for chest pain. May repeat for up to 3 doses.    [provider]  omeprazole  (PRILOSEC) 40 MG capsule TAKE ONE CAPSULE BY MOUTH EVERY DAY 05/06/24   Carlan, Chelsea L, NP  ondansetron  (ZOFRAN ) 4 MG tablet Take 1 tablet (4 mg total) by mouth every 8 (eight) hours as needed for nausea or vomiting. 06/05/24   Eartha Angelia, Sieving, MD  Probiotic Product (ALIGN) 4 MG CAPS Take 4 mg by mouth in the morning.    [provider]  traZODone  (DESYREL ) 50 MG tablet Take 50 mg by mouth at bedtime.    [provider]  Vedolizumab  (ENTYVIO  IV) Inject into the vein. Every 8 weeks.  [provider]  warfarin (COUMADIN ) 2.5 MG tablet TAKE 1 TO 1 & 1/2 TABLETS BY MOUTH DAILY OR AS DIRECTED by coumadin  clinic Patient taking differently: Take 2.5 mg by mouth See admin instructions. 1.5 tablets on Mondays. 1 tablet on all other days 04/09/24   Debera Jayson MATSU, MD     Family History  Problem Relation Age of Onset   Colon cancer Mother    Heart disease Father    Stroke Brother    Healthy Daughter     Social History   Socioeconomic History   Marital status: Married    Spouse name: Not on file   Number of children: Not on file   Years of education: Not on file   Highest education level: Not on file  Occupational History   Occupation: Retired-Hospital Radio broadcast assistant: RETIRED  Tobacco Use   Smoking status: Never    Passive exposure: Never   Smokeless tobacco: Never  Vaping Use   Vaping status: Never Used  Substance and  Sexual Activity   Alcohol  use: No    Alcohol /week: 0.0 standard drinks of alcohol    Drug use: No   Sexual activity: Not on file  Other Topics Concern   Not on file  Social History Narrative   Married   Social Drivers of Health   Financial Resource Strain: Not on file  Food Insecurity: No Food Insecurity (02/27/2024)   Hunger Vital Sign    Worried About Running Out of Food in the Last Year: Never true    Ran Out of Food in the Last Year: Never true  Transportation Needs: No Transportation Needs (02/27/2024)   PRAPARE - Administrator, Civil Service (Medical): No    Lack of Transportation (Non-Medical): No  Physical Activity: Not on file  Stress: Not on file  Social Connections: Socially Integrated (02/27/2024)   Social Connection and Isolation Panel    Frequency of Communication with Friends and Family: More than three times a week    Frequency of Social Gatherings with Friends and Family: More than three times a week    Attends Religious Services: More than 4 times per year    Active Member of [employer] or Organizations: Yes    Attends Engineer, structural: More than 4 times per year    Marital Status: Married   Review of Systems  Review of Systems: A 12 point ROS discussed and pertinent positives are indicated in the HPI above.  All other systems are negative.  Physical Exam No direct physical exam was performed (except for noted visual exam findings with Video Visits).    Vital Signs: There were no vitals taken for this visit.  Imaging: MR LUMBAR SPINE WO CONTRAST Result Date: 06/04/2024 CLINICAL DATA:  Provided history: Status post kyphoplasty. Additional history provided: Back pain. EXAM: MRI LUMBAR SPINE WITHOUT CONTRAST TECHNIQUE: Multiplanar, multisequence MR imaging of the lumbar spine was performed. No intravenous contrast was administered. COMPARISON:  Lumbar spine CT 05/06/2024. Report from lumbar spine MRI 03/28/2018 (images currently unavailable).  FINDINGS: Segmentation: 5 lumbar vertebrae. The caudal most well-formed intervertebral disc space is designated L5-S1. Alignment: Dextrocurvature of the lumbar spine. 3 mm bony retropulsion at the level of the L1 superior endplate, new from the prior lumbar spine CT of 05/06/2024. Slight grade 1 retrolisthesis at L1-L2 and L2-L3. Vertebrae: Interval L1 vertebral augmentation. L1 vertebral body height loss has progressed (now 30-40%). Edema throughout the L1 vertebral body. Mild chronic T12 anterior wedge  deformity, unchanged. T11 vertebral body hemangioma. Multilevel ventrolateral osteophytes. Conus medullaris and cauda equina: Conus extends to the L2 level. No signal abnormality identified within the visualized distal spinal cord. Paraspinal and other soft tissues: Homogeneously T2 hyperintense renal lesions bilaterally, most consistent with cysts. No specific follow-up imaging is necessary. No paraspinal mass or collection. Disc levels: Multilevel disc degeneration, greatest at L4-L5 and L5-S1 (moderate at these levels). T11-T12: This level is imaged in the sagittal plane only. Slight disc bulge. No significant spinal canal or foraminal stenosis. T12-L1: 3 mm bony retropulsion at the level of the L1 superior endplate. Minimal effacement of the ventral thecal sac without significant spinal canal stenosis. No significant foraminal stenosis. L1-L2: Slight grade 1 retrolisthesis. Small disc bulge. No significant spinal canal or foraminal stenosis. L2-L3: Slight grade 1 retrolisthesis. Disc bulge. Superimposed small broad-based right foraminal disc protrusion. Mild facet arthropathy. No significant spinal canal stenosis. Mild relative right neural foraminal narrowing. L3-L4: Small disc bulge. Mild facet arthropathy no significant spinal canal or foraminal stenosis. L4-L5: Disc bulge asymmetric to the left. Endplate osteophytes along the left lateral aspect of the disc space. Facet hypertrophy. Ligamentum flavum  hypertrophy (greater on the left). Moderate left subarticular stenosis with crowding of the descending left L5 nerve root (series 106, image 31). No significant central canal stenosis. Bilateral neural foraminal narrowing (mild right, moderate left). L5-S1: Disc bulge with endplate osteophytes. Mild facet arthropathy. Mild ligamentum flavum thickening (greater on the left). No significant spinal canal stenosis. Bilateral neural foraminal narrowing (severe right, mild-to-moderate left). IMPRESSION: 1. Interval L1 vertebral augmentation since the lumbar spine CT of 05/06/2024. Progressive L1 vertebral body height loss (now 30-40%). There is now 3 mm bony retropulsion at the level of the superior endplate, which does not result in significant spinal canal stenosis. Marrow edema throughout the L1 vertebral body. 2. Unchanged mild chronic anterior wedge deformity of the T12 vertebral body. 3. Spondylosis at the lumbar and visible lower thoracic levels, as outlined within the body of the report. No significant spinal canal stenosis. Multilevel foraminal stenosis, greatest on the left at L4-L5 (moderate) and bilaterally at L5-S1 (severe right, mild-to-moderate left). Electronically Signed   By: Rockey Childs D.O.   On: 06/04/2024 12:58   IR KYPHO LUMBAR INC FX REDUCE BONE BX UNI/BIL CANNULATION INC/IMAGING Result Date: 05/27/2024 CLINICAL DATA:  89 year old male with osteoporotic compression fracture of the L1 vertebra. He is highly symptomatic and has failed conservative therapy. Therefore, he presents for cement augmentation with balloon kyphoplasty. EXAM: FLUOROSCOPIC GUIDED KYPHOPLASTY OF THE L1 VERTEBRAL BODY COMPARISON:  None Available. MEDICATIONS: As antibiotic prophylaxis, 2 g Ancef  was ordered pre-procedure and administered intravenously by the Radiology nurse within 1 hour of incision. Additionally, 1 g Tylenol  and 30 mg Toradol  were administered intravenously by the Radiology nurse. ANESTHESIA/SEDATION:  Moderate (conscious) sedation was employed during this procedure. A total of Versed  1 mg and Fentanyl  50 mcg was administered intravenously by the Radiology nurse. Moderate Sedation Time: 20 minutes. The patient's level of consciousness and vital signs were monitored continuously by radiology nursing throughout the procedure under my direct supervision. FLUOROSCOPY TIME:  Radiation exposure index: 29.6 mGy, air kerma COMPLICATIONS: None immediate. PROCEDURE: The procedure, risks (including but not limited to bleeding, infection, organ damage), benefits, and alternatives were explained to the patient. Questions regarding the procedure were encouraged and answered. The patient understands and consents to the procedure. The patient has suffered a fracture of the L1. It is recommended that patients aged 50 years or  older be evaluated for possible testing or treatment of osteoporosis. The patient was placed prone on the fluoroscopic table. The skin overlying the lumbar region was then prepped and draped in the usual sterile fashion. Maximal barrier sterile technique was utilized including caps, mask, sterile gowns, sterile gloves, sterile drape, hand hygiene and skin antiseptic. Intravenous Fentanyl  and Versed  were administered as conscious sedation during continuous cardiorespiratory monitoring by the radiology RN. The left pedicle at L1 was then infiltrated with 1% lidocaine  followed by the advancement of a Kyphon trocar needle through the left pedicle into the posterior one-third of the vertebral body. Subsequently, the osteo drill was advanced to the anterior third of the vertebral body. The osteo drill was retracted. Through the working cannula, a Kyphon inflatable bone tamp 15 x 2.5 was advanced and positioned with the distal marker approximately 5 mm from the anterior aspect of the cortex. Appropriate positioning was confirmed on the AP projection. At this time, the balloon was expanded using contrast via a Kyphon  inflation syringe device via micro tubing. In similar fashion, the right L1 pedicle was infiltrated with 1% lidocaine  followed by the advancement of a second Kyphon trocar needle through the right pedicle into the posterior third of the vertebral body. Subsequently, the osteo drill was coaxially advanced to the anterior right third. The osteo drill was exchanged for a Kyphon inflatable bone tamp 15 x 2.5, advanced to the 5 mm of the anterior aspect of the cortex. The balloon was then expanded using contrast as above. Inflations were continued until there was near apposition with the superior end plate. At this time, methylmethacrylate mixture was reconstituted in the Kyphon bone mixing device system. This was then loaded into the delivery mechanism, attached to Kyphon bone fillers. The balloons were deflated and removed followed by the instillation of methylmethacrylate mixture with excellent filling in the AP and lateral projections. No extravasation was noted in the disk spaces or posteriorly into the spinal canal. No epidural venous contamination was seen. The working cannulae and the bone filler were then retrieved and removed. Hemostasis was achieved with manual compression. The patient tolerated the procedure well without immediate postprocedural complication. IMPRESSION: 1. Technically successful L1 vertebral body augmentation using balloon kyphoplasty. 2. Per CMS PQRS reporting requirements (PQRS Measure 24): Given the patient's age of greater than 50 and the fracture site (hip, distal radius, or spine), the patient should be tested for osteoporosis using DXA, and the appropriate treatment considered based on the DXA results. Electronically Signed   By: Wilkie Lent M.D.   On: 05/27/2024 11:48   IR Radiologist Eval & Mgmt Result Date: 05/23/2024 : Chief Complaint:Patient was seen in consultation today for subacute vertebral compression fracture at the request of Strader,ChristineReferring  Physician(s):Strader,ChristineHistory of Present Illness:89 yo male with afib (on coumadin ), presbycusis (r ear better), CAD, GERD, MR, hx SBO, Crohn's disease, kidney stones, BPH. Pt fell on 7/19. He was seen in the ED and scans were obtained. He had no internal injury evident and was d/c home. 05/06/24 returned to ED feeling sob and nauseous since d/c. He has not felt himself. He has been awake and alert and has been ambulatory. CT demonstrated Nondisplaced fracture through the superior aspect of the L1 vertebra extending into the anterior vertebral body cortex. No significant compression. No retropulsion. 05/09/2024 Subsequent outpatient films showed progressive loss of height L1 vertebral body.No new bowel or bladder control issues. Taking Tylenol  for pain, incomplete relief. Used a Norco this morning with improved relief, but  he is very anxious about the warnings of abuse potential. Previously ambulated with cane/walker, now moves more slowly.No lower extremity tingling, pain, or weakness.Rates pain 8/10 on VAS.SCores 23/24 on [employer] Disability Questionnaire Past Medical History: Diagnosis * : Date . * : Anemia * : . * : Atrial fibrillation (HCC) * : . * : BPH (benign prostatic hyperplasia) * : . * : CAD (coronary artery disease) * : * : Catheterization 2004, mild/moderate nonobstructive disease / nuclear, 2007, small inferior scar // no ischemia . * : Crohn's disease (HCC) * : . * : Elevated PSA * : . * : GERD (gastroesophageal reflux disease) * : . * : History of kidney stones * : . * : History of pneumonia * : . * : Hypothyroidism * : . * : Mitral regurgitation * : . * : Pneumonia * : . * : SBO (small bowel obstruction) (HCC) * : . * : Urinary retention * : . * : Wears glasses * : Past Surgical History: Procedure * : Laterality * : Date . * BETHA BERGAMO CAPSULE * : * : 10/18/2011 * : Procedure: AGILE CAPSULE; Surgeon: Claudis RAYMOND Rivet, MD; [facility]; Service: Endoscopy; Laterality: N/A; 730 . * :  BIOPSY * : * : 10/05/2022 * : Procedure: BIOPSY; Surgeon: Eartha Flavors, Toribio, MD; [facility]; Service: Gastroenterology;; . * : BIOPSY * : * : 01/02/2023 * : Procedure: BIOPSY; Surgeon: Eartha Flavors Toribio, MD; [facility]; Service: Gastroenterology;; . * : CARDIAC CATHETERIZATION * : * : 2004 . * : CATARACT EXTRACTION W/PHACO * : Right * : 06/27/2021 * : Procedure: CATARACT EXTRACTION PHACO AND INTRAOCULAR LENS PLACEMENT (IOC); Surgeon: Harrie Agent, MD; Location: [facility]; Service: Ophthalmology; Laterality: Right; CDE 21.98 . * : CATARACT EXTRACTION W/PHACO * : Left * : 07/11/2021 * : Procedure: CATARACT EXTRACTION PHACO AND INTRAOCULAR LENS PLACEMENT LEFT EYE; Surgeon: Harrie Agent, MD; Location: [facility]; Service: Ophthalmology; Laterality: Left; CDE=10.42 . * : CHOLECYSTECTOMY * : * : 2010 * : Dr. DeMason . * : COLONOSCOPY * : * : 2008 * : DeMason . * : COLONOSCOPY * : N/A * : 03/23/2016 * : Procedure: COLONOSCOPY; Surgeon: Claudis RAYMOND Rivet, MD; [facility]; Service: Endoscopy; Laterality: N/A; 1:00 . * : CYSTOSCOPY WITH INSERTION OF UROLIFT * : * : . * : ESOPHAGEAL BRUSHING * : * : 10/05/2022 * : Procedure: ESOPHAGEAL BRUSHING; Surgeon: Eartha Flavors, Toribio, MD; [facility]; Service: Gastroenterology;; . * : ESOPHAGOGASTRODUODENOSCOPY * : * : 11/08/2011 * : Procedure: ESOPHAGOGASTRODUODENOSCOPY (EGD); Surgeon: Claudis RAYMOND Rivet, MD; [facility]; Service: Endoscopy; Laterality: N/A; 300 . * : ESOPHAGOGASTRODUODENOSCOPY * : N/A * : 02/15/2024 * : Procedure: EGD (ESOPHAGOGASTRODUODENOSCOPY); Surgeon: Eartha Flavors, Toribio, MD; [facility]; Service: Gastroenterology; Laterality: N/A; 2:00PM;ASA 3 . * : ESOPHAGOGASTRODUODENOSCOPY (EGD) WITH PROPOFOL  * : N/A * : 10/05/2022 * : Procedure: ESOPHAGOGASTRODUODENOSCOPY (EGD) WITH PROPOFOL ; Surgeon: Eartha Flavors Toribio, MD; [facility]; Service: Gastroenterology;  Laterality: N/A; . * : ESOPHAGOGASTRODUODENOSCOPY (EGD) WITH PROPOFOL  * : N/A * : 01/02/2023 * : Procedure: ESOPHAGOGASTRODUODENOSCOPY (EGD) WITH PROPOFOL ; Surgeon: Eartha Flavors Toribio, MD; [facility]; Service: Gastroenterology; Laterality: N/A; 1:30 pm, asa 3 . * : HEMOSTASIS CLIP PLACEMENT * : * : 10/05/2022 * : Procedure: HEMOSTASIS CLIP PLACEMENT; Surgeon: Eartha Flavors, Toribio, MD; [facility]; Service: Gastroenterology;; . * : POLYPECTOMY * : * : 03/23/2016 * :  Procedure: POLYPECTOMY; Surgeon: Claudis RAYMOND Rivet, MD; [facility]; Service: Endoscopy;; Cecal polyp removed via cold forcepsrecto-sigmoid polyp removed via cold snare . * : POLYPECTOMY * : * : 10/05/2022 * : Procedure: POLYPECTOMY; Surgeon: Eartha Flavors, Toribio, MD; [facility]; Service: Gastroenterology;; . * : TRANSURETHRAL RESECTION OF PROSTATE * : N/A * : 04/29/2020 * : Procedure: TRANSURETHRAL RESECTION OF THE PROSTATE (TURP); Surgeon: Matilda Senior, MD; [facility]; Service: Urology; Laterality: N/A; 90 MINS Allergies:Patient has no known allergies.Medications: Prior to Admission medications Medication * : Sig * : Start Date * : End Date * : Taking? * : Authorizing Provider acetaminophen  (TYLENOL ) 500 MG tablet * : Take 1,000 mg by mouth every 6 (six) hours as needed for mild pain (pain score 1-3) or moderate pain (pain score 4-6). * : * : * : * : [provider] CALCIUM  PO * : Take 300 mg by mouth daily after lunch. * : * : * : * : [provider] Cholecalciferol (VITAMIN D3) 50 MCG (2000 UT) TABS * : Take 2,000 Units by mouth daily. * : * : * : * : [provider] cyanocobalamin  (VITAMIN B12) 1000 MCG tablet * : Take 1,000 mcg by mouth daily. * : * : * : * : [provider] diltiazem  (CARDIZEM  CD) 360 MG 24 hr capsule * : Take 1 capsule (360 mg total) by mouth daily. * : 03/03/24 * : * : * BETHA Evonnie Lenis, MD  HYDROcodone -acetaminophen  (NORCO/VICODIN) 5-325 MG tablet * : Take 1 tablet by mouth every 4 (four) hours as needed. * : 05/06/24 * : * : * BETHA Dean Clarity, MD levothyroxine  (SYNTHROID , LEVOTHROID) 75 MCG tablet * : Take 75 mcg by mouth daily before breakfast. * : * : * : * : [provider] magnesium  oxide (MAG-OX) 400 (240 Mg) MG tablet * : Take 400 mg by mouth 2 (two) times daily. * : * : * : * : [provider] melatonin 3 MG TABS tablet * : Take 6 mg by mouth at bedtime. * : * : * : * : [provider] metoprolol  succinate (TOPROL -XL) 50 MG 24 hr tablet * : Take 1 tablet (50 mg total) by mouth daily. * : 03/03/24 * : * : * BETHA Evonnie Lenis, MD nitroGLYCERIN  (NITROSTAT ) 0.4 MG SL tablet * : Place 0.4 mg under the tongue every 5 (five) minutes as needed for chest pain. May repeat for up to 3 doses. * : * : * : * : [provider] omeprazole  (PRILOSEC) 40 MG capsule * : TAKE ONE CAPSULE BY MOUTH EVERY DAY * : 05/06/24 * : * : * : Carlan, Chelsea L, NP Probiotic Product (ALIGN) 4 MG CAPS * : Take 4 mg by mouth in the morning. * : * : * : * : [provider] traZODone  (DESYREL ) 50 MG tablet * : Take 50 mg by mouth at bedtime. * : * : * : * : [provider] Vedolizumab  (ENTYVIO  IV) * : Inject into the vein. Every 8 weeks. * : * : * : * : [provider] warfarin (COUMADIN ) 2.5 MG tablet * : TAKE 1 TO 1 & 1/2 TABLETS BY MOUTH DAILY OR AS DIRECTED by coumadin  clinicPatient taking differently: Take 2.5 mg by mouth See admin instructions. 1.5 tablets on Mondays. 1 tablet on all other days * : 04/09/24 * : * : * BETHA Sierras,  Jayson MATSU, MD Family History Problem * : Relation * : Age of Onset . * : Colon cancer * : Mother * : . * : Heart disease * : Father * : . * : Stroke * : Brother * : . * : Healthy * : Daughter * : Socioeconomic History . * : Marital status: * : Married * : * : Spouse name: * : Not on file . * : Number of children: * : Not on file . * : Years of  education: * : Not on file . * : Highest education level: * : Not on file Occupational History . * : Occupation: * : Forensic scientist * : * : Employer: * : RETIRED Tobacco Use . * : Smoking status: * : Never * : * : Passive exposure: * : Never . * : Smokeless tobacco: * : Never Vaping Use . * : Vaping status: * : Never Used Substance and Sexual Activity . * : Alcohol  use: * : No * : * : Alcohol /week: * : 0.0 standard drinks of alcohol  . * : Drug use: * : No . * : Sexual activity: * : Not on file Other Topics * : Concern . 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 * : Worried About Running Out of Food in the Last Year: Never true * : . * : Ran Out of Food in the Last Year: Never true Transportation Needs: No Transportation Needs (02/27/2024) * : PRAPARE - Transportation * : . * : Lack of Transportation (Medical): No * : . * : Lack of Transportation (Non-Medical): No Physical Activity: Not on file Stress: Not on file Social Connections: Socially Integrated (02/27/2024) * : Social Connection and Isolation Panel * : . * : Frequency of Communication with Friends and Family: More than three times a week * : . * : Frequency of Social Gatherings with Friends and Family: More than three times a week * : . * : Attends Religious Services: More than 4 times per year * : . * : Active Member of Clubs or Organizations: Yes * : . * : Attends Club or Organization Meetings: More than 4 times per year * : . * : Marital Status: Married ECOG Status:2 - Symptomatic, <50% confined to bedReview of Systems: A 12 point ROS discussed and pertinent positives are indicated in the HPI above. All other systems are negative.Review of SystemsVital Signs:BP 130/70  Pulse 83  Temp 98.1 F (36.7 C)  Resp 16  SpO2 97% Physical ExamConstitutional: Oriented to person, place, and time. Well-developed and well-nourished.  No distress. ? HENT: Head: Normocephalic and atraumatic. Eyes: Conjunctivae and EOM are normal. Right eye exhibits no discharge. Left eye exhibits no discharge. No scleral icterus. Neck: No JVD present. Pulmonary/Chest: Effort normal. No stridor. No respiratory distress. Abdomen: soft, non distendedNeurological: alert and oriented to person, place, and time. Skin: Skin is warm and dry. not diaphoretic. Psychiatric: normal mood and affect. behavior is normal. Judgment and thought content normal. Imaging: CT CHEST ABDOMEN PELVIS W CONTRASTResult Date: 7/22/2025CLINICAL DATA: Polytrauma, blunt Pt states he has been having SOB since discharge on 7/19. EXAM: CT CHEST, ABDOMEN, AND PELVIS WITH CONTRAST TECHNIQUE: Multidetector CT imaging of the chest, abdomen and pelvis was performed following the standard protocol during bolus administration of intravenous contrast. RADIATION DOSE REDUCTION: This exam  was performed according to the departmental dose-optimization program which includes automated exposure control, adjustment of the mA and/or kV according to patient size and/or use of iterative reconstruction technique. CONTRAST: OMNIPAQUE  IOHEXOL  300 MG/ML SOLN COMPARISON: CT chest abdomen pelvis 05/03/2024, CT abdomen pelvis 12/06/2023, CT abdomen pelvis 08/10/2017 FINDINGS: CHEST: Cardiovascular: No aortic injury. The thoracic aorta is normal in caliber. The heart is prominent in size. No significant pericardial effusion. Mild atherosclerotic plaque. At least 3 vessel coronary calcification. The main pulmonary artery is normal in caliber. No central pulmonary embolus. Unable to evaluate more distally due to timing of contrast. Mediastinum/Nodes: No pneumomediastinum. No mediastinal hematoma. The esophagus is unremarkable. Small hiatal hernia. The thyroid  is unremarkable. The central airways are patent. No mediastinal, hilar, or axillary lymphadenopathy. Lungs/Pleura: Trace biapical pleural/pulmonary scarring.  Redemonstration of right middle lobe cluster of micronodules. No pulmonary mass. No pulmonary contusion or laceration. No pneumatocele formation. Interval development of trace right pleural effusion. No pneumothorax. No hemothorax. Musculoskeletal/Chest wall: No chest wall mass. Diffusely decreased bone density. No acute rib or sternal fracture. No spinal fracture. ABDOMEN / PELVIS: Hepatobiliary: Not enlarged. No focal lesion. No laceration or subcapsular hematoma. Status post cholecystectomy. No biliary ductal dilatation. Pancreas: Normal pancreatic contour. No main pancreatic duct dilatation. Spleen: Not enlarged. Chronic benign cystic lesion within the spleen. No laceration, subcapsular hematoma, or vascular injury. Adrenals/Urinary Tract: No nodularity bilaterally. Bilateral kidneys enhance symmetrically. No hydronephrosis. No contusion, laceration, or subcapsular hematoma. Fluid density lesion within left kidney likely represents a simple renal cyst. Simple renal cysts, in the absence of clinically indicated signs/symptoms, require no independent follow-up. No injury to the vascular structures or collecting systems. No hydroureter. The urinary bladder is unremarkable. On delayed imaging, there is no urothelial wall thickening and there are no filling defects in the opacified portions of the bilateral collecting systems or ureters. Stomach/Bowel: Terminal ileum resection. No small or large bowel wall thickening or dilatation. Colonic diverticulosis. Multiple ingested indication or candies within the lumen of the large bowel. Likely appendectomy. Vasculature/Lymphatics: Severe atherosclerotic plaque. No abdominal aorta or iliac aneurysm. No active contrast extravasation or pseudoaneurysm. No abdominal, pelvic, inguinal lymphadenopathy. Reproductive: Prostate is unremarkable. Other: No simple free fluid ascites. No pneumoperitoneum. No hemoperitoneum. No mesenteric hematoma identified. No organized fluid  collection. Musculoskeletal: No significant soft tissue hematoma. Small fat containing left inguinal hernia. Diffusely decreased bone density. No acute pelvic fracture. Age-indeterminate L1 fracture. Please see separately dictated CT lumbar spine 05/06/2024. Other ports and devices: None. IMPRESSION: 1. Redemonstration of right middle lobe cluster of micronodules-likely inflammatory infectious in etiology. Interval development of trace right pleural effusion. 2. No acute intrathoracic, intra-abdominal, intrapelvic traumatic injury. 3. No acute fracture or traumatic malalignment of the thoracic spine. 4. Please see separately dictated CT lumbar spine 05/06/2024. 5. Other imaging findings of potential clinical significance: Cardiomegaly. No central pulmonary embolus - unable to evaluate more distally due to timing of contrast. Small hiatal hernia. Multiple ingested indication or candies within the lumen of the large bowel. Colonic diverticulosis with no acute diverticulitis. Small fat containing left inguinal hernia. Aortic Atherosclerosis (ICD10-I70.0). Electronically Signed By: Morgane Naveau M.D. On: 05/06/2024 18:30 CT L-SPINE NO CHARGEResult Date: 7/22/2025CLINICAL DATA: Fall and back pain. EXAM: CT LUMBAR SPINE WITHOUT CONTRAST TECHNIQUE: Multidetector CT imaging of the lumbar spine was performed without intravenous contrast administration. Multiplanar CT image reconstructions were also generated. RADIATION DOSE REDUCTION: This exam was performed according to the departmental dose-optimization program which includes automated exposure control, adjustment of the mA  and/or kV according to patient size and/or use of iterative reconstruction technique. COMPARISON: CT abdomen pelvis dated 02/27/2024. FINDINGS: Segmentation: 5 lumbar type vertebrae. Alignment: No acute subluxation. Vertebrae: There is a nondisplaced fracture through the superior aspect of the L1 vertebra extending into the anterior vertebral body  cortex. No significant compression. No retropulsion. The bones are osteopenic. Paraspinal and other soft tissues: Mild stranding of the anterior paraspinal tissue head L1. No significant fluid collection or hematoma. Disc levels: Multilevel degenerative changes. There is disc desiccation and vacuum phenomena primarily at L2-L3 and L4-L5. IMPRESSION: Nondisplaced fracture through the superior aspect of the L1 vertebra extending into the anterior vertebral body cortex. No significant compression. No retropulsion. Electronically Signed By: Vanetta Chou M.D. On: 05/06/2024 18:27 CT Head Wo ContrastResult Date: 7/22/2025CLINICAL DATA: Polytrauma, blunt Pt states he has been having SOB since discharge on 7/19. EXAM: CT HEAD WITHOUT CONTRAST TECHNIQUE: Contiguous axial images were obtained from the base of the skull through the vertex without intravenous contrast. RADIATION DOSE REDUCTION: This exam was performed according to the departmental dose-optimization program which includes automated exposure control, adjustment of the mA and/or kV according to patient size and/or use of iterative reconstruction technique. COMPARISON: CT head 05/03/2024 FINDINGS: Brain: Patchy and confluent areas of decreased attenuation are noted throughout the deep and periventricular white matter of the cerebral hemispheres bilaterally, compatible with chronic microvascular ischemic disease. No evidence of large-territorial acute infarction. No parenchymal hemorrhage. No mass lesion. No extra-axial collection. No mass effect or midline shift. No hydrocephalus. Basilar cisterns are patent. Vascular: No hyperdense vessel. Atherosclerotic calcifications are present within the cavernous internal carotid and vertebral arteries. Skull: No acute fracture or focal lesion. Sinuses/Orbits: Right sphenoid sinus mucosal thickening. Paranasal sinuses and mastoid air cells are clear. The orbits are unremarkable. Other: None. IMPRESSION: No acute  intracranial abnormality. Electronically Signed By: Morgane Naveau M.D. On: 05/06/2024 18:03 CT T-SPINE NO CHARGEResult Date: 7/19/2025EXAM: CT THORACIC SPINE WITHOUT CONTRAST 05/03/2024 08:21:18 PM TECHNIQUE: CT of the thoracic spine was performed without the administration of intravenous contrast. Multiplanar reformatted images are provided for review. Automated exposure control, iterative reconstruction, and/or weight based adjustment of the mA/kV was utilized to reduce the radiation dose to as low as reasonably achievable. COMPARISON: None available. CLINICAL HISTORY: Ataxia, thoracic trauma. Pt fell after pulling electric blanket cord and pt lost his balance causing him to fall backwards. Denies LOC. Landed on bottom and lightly hit back of head on carpeted floor. Occurred this morning at 1000. + nausea, denies emesis. Pt is on blood thinner Warfarin. FINDINGS: BONES AND ALIGNMENT: Normal vertebral body heights. No acute fracture or suspicious bone lesion. Normal alignment. DEGENERATIVE CHANGES: Mild degenerative changes of the mid to lower thoracic spine. SOFT TISSUES: Evaluated on dedicated CT chest. IMPRESSION: 1. No traumatic injury to the thoracic spine. Electronically signed by: Pinkie Pebbles MD 05/03/2024 08:41 PM EDT RP Workstation: [id number] CT Cervical Spine Wo ContrastResult Date: 7/19/2025EXAM: CT CERVICAL SPINE WITHOUT CONTRAST 05/03/2024 08:21:18 PM TECHNIQUE: CT of the cervical spine was performed without the administration of intravenous contrast. Multiplanar reformatted images are provided for review. Automated exposure control, iterative reconstruction, and/or weight based adjustment of the mA/kV was utilized to reduce the radiation dose to as low as reasonably achievable. COMPARISON: None available. CLINICAL HISTORY: Ataxia, cervical trauma. Pt fell after pulling electric blanket cord and pt lost his balance causing him to fall backwards. Denies LOC. Landed on bottom and lightly hit back  of head on carpeted floor. Occurred this morning at  1000. + nausea, denies emesis. Pt is on blood thinner Warfarin. FINDINGS: CERVICAL SPINE: BONES AND ALIGNMENT: No acute fracture or traumatic malalignment. DEGENERATIVE CHANGES: Mild degenerative changes of the mid cervical spine. SOFT TISSUES: No prevertebral soft tissue swelling. IMPRESSION: 1. No traumatic injury to the cervical spine. Electronically signed by: Pinkie Pebbles MD 05/03/2024 08:40 PM EDT RP Workstation: [id number] CT CHEST ABDOMEN PELVIS W CONTRASTResult Date: 7/19/2025EXAM: CT CHEST, ABDOMEN AND PELVIS WITH CONTRAST 05/03/2024 08:21:18 PM TECHNIQUE: CT of the chest, abdomen and pelvis was performed with the administration of intravenous contrast. Multiplanar reformatted images are provided for review. Automated exposure control, iterative reconstruction, and/or weight based adjustment of the mA/kV was utilized to reduce the radiation dose to as low as reasonably achievable. COMPARISON: CT abdomen and pelvis dated 02/27/2024. CLINICAL HISTORY: Right sided abdominal and flank pain. Patient fell after pulling electric blanket cord and lost balance causing a fall backwards. Denies loss of consciousness. Landed on bottom and lightly hit back of head on carpeted floor. Occurred this morning at 1000. Positive nausea, denies emesis. Patient is on blood thinner Warfarin. FINDINGS: CHEST: MEDIASTINUM: Heart and pericardium are unremarkable. The central airways are clear. Mild 3-vessel coronary atherosclerosis. THORACIC LYMPH NODES: No mediastinal, hilar or axillary lymphadenopathy. LUNGS AND PLEURA: Mild centrilobular emphysematous changes, upper lung predominant. No focal consolidation or pulmonary edema. No pleural effusion or pneumothorax. ABDOMEN AND PELVIS: LIVER: The liver is unremarkable. GALLBLADDER AND BILE DUCTS: Status post cholecystectomy. No biliary ductal dilatation. SPLEEN: 2.7 cm medial splenic cyst (image 86), benign. PANCREAS: No acute  abnormality. ADRENAL GLANDS: No acute abnormality. KIDNEYS, URETERS AND BLADDER: 11 mm posterior left lower pole renal cyst (image 94), benign (Bosniak 1). No follow up is recommended. No stones in the kidneys or ureters. No hydronephrosis. No perinephric or periureteral stranding. Urinary bladder is unremarkable. GI AND BOWEL: Tiny hiatal hernia. Normal appendix (image 86). Small bowel resection with suture line in the right mid abdomen (image 72). There are numerous ingested tablets in this location (image 67), suggesting some degree of anastomotic stricture. No dilated bowel to suggest small bowel obstruction. Mild left colonic diverticulosis, without evidence of diverticulitis. REPRODUCTIVE ORGANS: Prostate is notable for posterior cutaneous related to prior TURP. PERITONEUM AND RETROPERITONEUM: No ascites. No free air. VASCULATURE: Atherosclerotic calcifications of the abdominal aorta and branch vessels, although patent. Mild thoracic aortic atherosclerosis. ABDOMINAL AND PELVIS LYMPH NODES: No lymphadenopathy. REPRODUCTIVE ORGANS: No acute abnormality. BONES AND SOFT TISSUES: Mild degenerative changes of the mid/lower thoracic spine. Moderate degenerative changes of the lumbar spine. Small fat containing left inguinal hernia. No acute osseous abnormality. No focal soft tissue abnormality. IMPRESSION: 1. Small bowel resection with suture line in the right mid abdomen. Numerous ingested tablets in this location suggest some degree of anastomotic stricture. No small bowel obstruction. 2. No acute findings in the chest. Electronically signed by: Pinkie Pebbles MD 05/03/2024 08:39 PM EDT RP Workstation: [id number] CT Head Wo ContrastResult Date: 7/19/2025EXAM: CT HEAD WITHOUT CONTRAST 05/03/2024 08:21:18 PM TECHNIQUE: CT of the head was performed without the administration of intravenous contrast. Automated exposure control, iterative reconstruction, and/or weight based adjustment of the mA/kV was utilized to  reduce the radiation dose to as low as reasonably achievable. COMPARISON: 12/24/2023 CLINICAL HISTORY: Head trauma, minor (Age >= 65y). Pt fell after pulling electric blanket cord and pt lost his balance causing him to fall backwards. Denies LOC. Landed on bottom and lightly hit back of head on carpeted floor. Occurred this morning at 1000. + nausea, denies  emesis. Pt is on blood thinner Warfarin. FINDINGS: BRAIN AND VENTRICLES: No acute hemorrhage. Gray-white differentiation is preserved. No hydrocephalus. No extra-axial collection. No mass effect or midline shift. Global cortical and central atrophy. Subcortical and periventricular small vessel ischemic changes. ORBITS: No acute abnormality. SINUSES: No acute abnormality. SOFT TISSUES AND SKULL: No acute soft tissue abnormality. No skull fracture. VASCULATURE: Intracranial atherosclerosis. IMPRESSION: 1. No acute intracranial abnormality. 2. Atrophy with small vessel ischemic changes. Electronically signed by: Pinkie Pebbles MD 05/03/2024 08:27 PM EDT RP Workstation: [id number] DG Hip Unilat W or Wo Pelvis 2-3 Views RightResult Date: 7/19/2025CLINICAL DATA: Lateral pain after fall. EXAM: DG HIP (WITH OR WITHOUT PELVIS) 2-3V RIGHT COMPARISON: None Available. FINDINGS: No evidence of acute fracture of the pelvis or right hip. No hip dislocation. Mild bilateral hip osteoarthritis. Pubic rami are intact. Pubic symphysis and sacroiliac joints are congruent. IMPRESSION: 1. No acute fracture or dislocation of the pelvis or right hip. 2. Mild bilateral hip osteoarthritis. Electronically Signed By: Andrea Gasman M.D. On: 05/03/2024 19:36 Labs:CBC: Recent Labs 05/19/25045507/10/25092407/19/25192207/22/251655 WBC11.4*8.111.1*9.3YHA86.585.084.787.0*YRU59.255.155.862.2*[id number] COAGS: Recent Labs 07/09/25111507/19/25192207/22/25163007/30/251119 INR3.02.5*2.9*4.2* BMP: Recent Labs  05/19/25045507/10/25092407/19/25192207/22/251630 [id number] K4.03.94.13.4*[id number] [id number]*821*876*837*[id number] CALCIUM8.6*9.39.18.5*CREATININE1.33*1.43*1.37*1.56*GFRNONAA51*47*49*42* LIVER FUNCTION TESTS: Recent Labs 05/14/25053705/15/25045707/10/25092407/19/251922 BILITOT1.01.7*[id number] PROT6.0*6.3*7.77.5ALBUMIN3.53.4*4.04.2 TUMOR MARKERS: No results for input(s): AFPTM, CEA, CA199, CHROMGRNA in the last 8760 hours. Assessment and Plan:My impression is that this patient has subacute lumbar L1 compression fracture deformity which likely contributes or accounts for most of the low back pain. Based on cross-sectional imaging, this would be anatomically approachable for percutaneous intervention. No associated spinal stenosis or other contraindications. No suggestion of metastatic disease or other pathologic findings to indicate a need for concomitant core biopsy.Given the lack of adequate symptom relief with time and a fairly aggressive pain medication regimen, and limitations of activities of daily living, the patient is clinically an appropriate candidate for consideration of vertebral augmentation.I discussed with the patient and spouse the pathophysiology of vertebral compression fracture deformities reviewing his images and spine model; the stable nature of these which does not require emergent treatment; natural history which includes healing over some unpredictable number of months. We discussed treatment options including watchful waiting, surgical fixation, and percutaneous kyphoplasty/vertebroplasty. We discussed in detail the percutaneous kyphoplasty technique, anticipated benefits, time course to symptom resolution, possible risks and side effects. We discussed his elevated risk of additional level fractures with or without vertebral augmentation. We discussed the long-term need for continued bone building therapy managed by the patient's  PCP. They seemed to understand, and did ask appropriate questions.The patient is motivated to proceed with treatment ASAP. Accordingly, we schedule percutaneous lumbar L1 kyphoplasty under moderate sedation as an outpatient at the patient's convenience, pending carrier approval if needed.Will consult cardiology re coumadin  management.Thank you for this interesting consult. I greatly enjoyed meeting [name] and look forward to participating in their care. A copy of this report was sent to the requesting provider on this date. Electronically Signed   By: JONETTA Faes M.D.   On: 05/23/2024 07:28    Labs:  CBC: Recent Labs    03/03/24 0455 04/24/24 0924 05/03/24 1922 05/06/24 1655  WBC 11.4* 8.1 11.1* 9.6  HGB 13.4 14.9 15.2 12.9*  HCT 40.7 44.8 44.1 37.7*  PLT 212 207 214 170    COAGS: Recent Labs    05/14/24 1119 05/26/24 1053 06/04/24 1558 06/10/24 1449  INR 4.2* 1.3* 1.6* 2.5    BMP: Recent Labs    03/03/24 0455 04/24/24 0924 05/03/24 1922 05/06/24 1630  NA 132* 138 139 136  K 4.0 3.9 4.1 3.4*  CL 102  101 101 101  CO2 24 23 25 22   GLUCOSE 107* 178* 123* 162*  BUN 18 17 16 18   CALCIUM  8.6* 9.3 9.1 8.5*  CREATININE 1.33* 1.43* 1.37* 1.56*  GFRNONAA 51* 47* 49* 42*    LIVER FUNCTION TESTS: Recent Labs    02/27/24 0537 02/28/24 0457 04/24/24 0924 05/03/24 1922  BILITOT 1.0 1.7* 0.8 0.8  AST 20 18 26 27   ALT 15 15 21 19   ALKPHOS 49 60 87 88  PROT 6.0* 6.3* 7.7 7.5  ALBUMIN 3.5 3.4* 4.0 4.2    TUMOR MARKERS: No results for input(s): AFPTM, CEA, CA199, CHROMGRNA in the last 8760 hours.  Assessment and Plan:  Very pleasant 89 year old gentleman who is now 2 weeks status post L1 kyphoplasty.  While his fracture pain is significantly improved, he continues to have severe right low back/posterior hip pain, particularly when standing.  He had an MRI of the lumbar spine which demonstrates significant spondylosis including at L5-S1 where there is severe  right and mild to moderate left foraminal stenosis.  This could certainly be contributing to his symptoms.  Additionally, he has a fairly significant facet arthropathy on the right at L5-S1 although no increased T2 or STIR signal to suggest active inflammation.  Overall, I feel that his spinal stenosis at L5-S1 and to a lesser extent L4-L5 is contributing to his persistent symptoms.  I believe he would benefit from an epidural steroid injection.  1.) Please schedule for right L5-S1 epidural steroid injection/nerve root block/facet injection (series of 3) at 315 or [facility].  I would start with right L5-S1 ESI and consider right L5-S1 facet injection for the 2nd or 3rd injection if ESI is not effective.  2.) Will need to hold coumadin    Electronically Signed: Wilkie MARLA Lent 06/12/2024, 8:20 AM   I spent a total of  15 Minutes in remote  clinical consultation, greater than 50% of which was counseling/coordinating care for low back pain.    Visit type: Audio only (telephone). Audio (no video) only due to patient's lack of internet/smartphone capability. Alternative for in-person consultation at [facility], [address] [place], [place], [place]. This visit type was conducted due to national recommendations for restrictions regarding the COVID-19 Pandemic (e.g. social distancing).  This format is felt to be most appropriate for this patient at this time.  All issues noted in this document were discussed and addressed.

## 2024-06-13 ENCOUNTER — Other Ambulatory Visit: Payer: Self-pay | Admitting: Interventional Radiology

## 2024-06-13 ENCOUNTER — Telehealth: Payer: Self-pay | Admitting: Cardiology

## 2024-06-13 DIAGNOSIS — K2100 Gastro-esophageal reflux disease with esophagitis, without bleeding: Secondary | ICD-10-CM | POA: Diagnosis not present

## 2024-06-13 DIAGNOSIS — I4891 Unspecified atrial fibrillation: Secondary | ICD-10-CM | POA: Diagnosis not present

## 2024-06-13 DIAGNOSIS — I10 Essential (primary) hypertension: Secondary | ICD-10-CM | POA: Diagnosis not present

## 2024-06-13 DIAGNOSIS — Z9889 Other specified postprocedural states: Secondary | ICD-10-CM

## 2024-06-13 DIAGNOSIS — E782 Mixed hyperlipidemia: Secondary | ICD-10-CM | POA: Diagnosis not present

## 2024-06-13 NOTE — Telephone Encounter (Signed)
 Pharmacy please advise on holding warfarin for 5 days prior to lumbar epideral scheduled for TBD. Last labs (CBC.CMET) 05/06/2024. Thank you.

## 2024-06-13 NOTE — Telephone Encounter (Signed)
   Patient Name: [name]  DOB: December 24, 1930 MRN: [id number]  Primary Cardiologist: Jayson Sierras, MD  Clinical pharmacists have reviewed the patient's past medical history, labs, and current medications as part of preoperative protocol coverage. The following recommendations have been made:  Patient with diagnosis of A Fib on warfarin for anticoagulation.     Procedure: Lumbar Epidural  Date of procedure: TBD     CHA2DS2-VASc Score = 5  This indicates a 7.2% annual risk of stroke. The patient's score is based upon: CHF History: 1 HTN History: 1 Diabetes History: 0 Stroke History: 0 Vascular Disease History: 1 Age Score: 2 Gender Score: 0     CrCl 29 ml/min Platelet count 170K   Patient has not had an Afib/aflutter ablation within the last 3 months or DCCV within the last 30 days   Per office protocol, patient can hold warfarin for 5 days prior to procedure.    I will route this recommendation to the requesting party via Epic fax function and remove from pre-op pool.  Please call with questions.  Lamarr Satterfield, NP 06/13/2024, 3:20 PM

## 2024-06-13 NOTE — Telephone Encounter (Signed)
 Patient with diagnosis of A Fib on warfarin for anticoagulation.    Procedure: Lumbar Epidural  Date of procedure: TBD   CHA2DS2-VASc Score = 5  This indicates a 7.2% annual risk of stroke. The patient's score is based upon: CHF History: 1 HTN History: 1 Diabetes History: 0 Stroke History: 0 Vascular Disease History: 1 Age Score: 2 Gender Score: 0     CrCl 29 ml/min Platelet count 170K  Patient has not had an Afib/aflutter ablation within the last 3 months or DCCV within the last 30 days   Per office protocol, patient can hold warfarin for 5 days prior to procedure.    Patient will not need bridging with Lovenox (enoxaparin) around procedure.  **This guidance is not considered finalized until pre-operative APP has relayed final recommendations.**

## 2024-06-13 NOTE — Telephone Encounter (Signed)
   Pre-operative Risk Assessment    Patient Name: [name]  DOB: 1931-07-04 MRN: [id number]      Request for Surgical Clearance    Procedure:  Lumbar Epidural  Date of Surgery:  Clearance TBD                                 Surgeon:not indicated Surgeon's Group or Practice Name:  [facility] Phone number:  [phone] Fax number:  [phone]   Type of Clearance Requested:   - Pharmacy:  Hold Warfarin (Coumadin ) for 5 days before procedure   Type of Anesthesia:  Not Indicated   Additional requests/questions:  Please advise surgeon/provider what medications should be held.  Signed, Darryle GORMAN Glance   06/13/2024, 2:14 PM

## 2024-06-17 ENCOUNTER — Encounter (HOSPITAL_COMMUNITY)
Admission: RE | Admit: 2024-06-17 | Discharge: 2024-06-17 | Disposition: A | Discharge status: Home / Self Care | Source: Ambulatory Visit | Attending: Gastroenterology | Admitting: Gastroenterology

## 2024-06-17 ENCOUNTER — Encounter (HOSPITAL_COMMUNITY): Payer: Self-pay

## 2024-06-17 DIAGNOSIS — M79671 Pain in right foot: Secondary | ICD-10-CM | POA: Diagnosis not present

## 2024-06-17 DIAGNOSIS — I739 Peripheral vascular disease, unspecified: Secondary | ICD-10-CM | POA: Diagnosis not present

## 2024-06-17 DIAGNOSIS — M79672 Pain in left foot: Secondary | ICD-10-CM | POA: Diagnosis not present

## 2024-06-17 DIAGNOSIS — M79675 Pain in left toe(s): Secondary | ICD-10-CM | POA: Diagnosis not present

## 2024-06-17 DIAGNOSIS — M79674 Pain in right toe(s): Secondary | ICD-10-CM | POA: Diagnosis not present

## 2024-06-17 DIAGNOSIS — L110 Acquired keratosis follicularis: Secondary | ICD-10-CM | POA: Diagnosis not present

## 2024-06-18 ENCOUNTER — Ambulatory Visit (INDEPENDENT_AMBULATORY_CARE_PROVIDER_SITE_OTHER): Payer: PPO | Admitting: Otolaryngology

## 2024-06-18 NOTE — Anesthesia Preprocedure Evaluation (Signed)
 Anesthesia Evaluation  Patient identified by MRN, date of birth, ID band Patient awake    Reviewed: Allergy  & Precautions, NPO status , Patient's Chart, lab work & pertinent test results, reviewed documented beta blocker date and time   Airway Mallampati: III  TM Distance: >3 FB Neck ROM: Full    Dental  (+) Dental Advisory Given, Missing   Pulmonary shortness of breath and with exertion, pneumonia, resolved   Pulmonary exam normal breath sounds clear to auscultation       Cardiovascular (-) hypertension+ CAD  Normal cardiovascular exam+ dysrhythmias Atrial Fibrillation + Valvular Problems/Murmurs MR  Rhythm:Irregular Rate:Normal  1. Left ventricular ejection fraction, by estimation, is 50 to 55%. The  left ventricle has low normal function. The left ventricle has no regional  wall motion abnormalities. Left ventricular diastolic parameters are  indeterminate.   2. Right ventricular systolic function is normal. The right ventricular  size is normal. There is normal pulmonary artery systolic pressure.   3. Large pleural effusion in the left lateral region.   4. The mitral valve is grossly normal. Mild mitral valve regurgitation.  No evidence of mitral stenosis.   5. The aortic valve is tricuspid. There is mild calcification of the  aortic valve. There is mild thickening of the aortic valve. Aortic valve  regurgitation is trivial. No aortic stenosis is present.   6. The inferior vena cava is dilated in size with >50% respiratory  variability, suggesting right atrial pressure of 8 mmHg.   Comparison(s): Large left sided pleural effusion is new.      Neuro/Psych negative neurological ROS  negative psych ROS   GI/Hepatic Neg liver ROS,GERD  Medicated,,Crohn's disease (HCC) Crohn's disease of small intestine (HCC) Duodenal carcinoid syndrome (HCC)     Endo/Other  Hypothyroidism    Renal/GU negative Renal ROS      Musculoskeletal negative musculoskeletal ROS (+)    Abdominal   Peds  Hematology  (+) Blood dyscrasia, anemia   Anesthesia Other Findings   Reproductive/Obstetrics negative OB ROS                             Anesthesia Physical Anesthesia Plan  ASA: 3  Anesthesia Plan: General   Post-op Pain Management: Minimal or no pain anticipated   Induction: Intravenous  PONV Risk Score and Plan: Propofol  infusion  Airway Management Planned: Nasal Cannula and Natural Airway  Additional Equipment:   Intra-op Plan:   Post-operative Plan:   Informed Consent: I have reviewed the patients History and Physical, chart, labs and discussed the procedure including the risks, benefits and alternatives for the proposed anesthesia with the patient or authorized representative who has indicated his/her understanding and acceptance.     Dental advisory given  Plan Discussed with: CRNA and Surgeon  Anesthesia Plan Comments:         Anesthesia Quick Evaluation

## 2024-06-19 ENCOUNTER — Ambulatory Visit (HOSPITAL_COMMUNITY): Payer: Self-pay | Admitting: Anesthesiology

## 2024-06-19 ENCOUNTER — Encounter (HOSPITAL_COMMUNITY): Payer: Self-pay | Admitting: Gastroenterology

## 2024-06-19 ENCOUNTER — Other Ambulatory Visit: Payer: Self-pay

## 2024-06-19 ENCOUNTER — Encounter (HOSPITAL_COMMUNITY): Payer: Self-pay | Admitting: Anesthesiology

## 2024-06-19 ENCOUNTER — Ambulatory Visit (HOSPITAL_COMMUNITY)
Admission: RE | Admit: 2024-06-19 | Discharge: 2024-06-19 | Disposition: A | Discharge status: Home / Self Care | Attending: Gastroenterology | Admitting: Gastroenterology

## 2024-06-19 ENCOUNTER — Encounter (HOSPITAL_COMMUNITY)
Admission: RE | Disposition: A | Discharge status: Home / Self Care | Payer: Self-pay | Source: Home / Self Care | Attending: Gastroenterology

## 2024-06-19 DIAGNOSIS — I4891 Unspecified atrial fibrillation: Secondary | ICD-10-CM | POA: Diagnosis not present

## 2024-06-19 DIAGNOSIS — K3189 Other diseases of stomach and duodenum: Secondary | ICD-10-CM

## 2024-06-19 DIAGNOSIS — Z8619 Personal history of other infectious and parasitic diseases: Secondary | ICD-10-CM | POA: Insufficient documentation

## 2024-06-19 DIAGNOSIS — D3A010 Benign carcinoid tumor of the duodenum: Secondary | ICD-10-CM

## 2024-06-19 DIAGNOSIS — C7A010 Malignant carcinoid tumor of the duodenum: Secondary | ICD-10-CM | POA: Diagnosis not present

## 2024-06-19 DIAGNOSIS — E3409 Other carcinoid syndrome: Secondary | ICD-10-CM | POA: Insufficient documentation

## 2024-06-19 DIAGNOSIS — K449 Diaphragmatic hernia without obstruction or gangrene: Secondary | ICD-10-CM

## 2024-06-19 DIAGNOSIS — Z7901 Long term (current) use of anticoagulants: Secondary | ICD-10-CM | POA: Diagnosis not present

## 2024-06-19 DIAGNOSIS — Z9049 Acquired absence of other specified parts of digestive tract: Secondary | ICD-10-CM | POA: Diagnosis not present

## 2024-06-19 DIAGNOSIS — K5000 Crohn's disease of small intestine without complications: Secondary | ICD-10-CM | POA: Diagnosis not present

## 2024-06-19 DIAGNOSIS — I340 Nonrheumatic mitral (valve) insufficiency: Secondary | ICD-10-CM | POA: Insufficient documentation

## 2024-06-19 DIAGNOSIS — K219 Gastro-esophageal reflux disease without esophagitis: Secondary | ICD-10-CM | POA: Diagnosis not present

## 2024-06-19 DIAGNOSIS — E039 Hypothyroidism, unspecified: Secondary | ICD-10-CM | POA: Diagnosis not present

## 2024-06-19 DIAGNOSIS — I2510 Atherosclerotic heart disease of native coronary artery without angina pectoris: Secondary | ICD-10-CM

## 2024-06-19 DIAGNOSIS — Z08 Encounter for follow-up examination after completed treatment for malignant neoplasm: Secondary | ICD-10-CM | POA: Insufficient documentation

## 2024-06-19 DIAGNOSIS — Z79620 Long term (current) use of immunosuppressive biologic: Secondary | ICD-10-CM | POA: Diagnosis not present

## 2024-06-19 DIAGNOSIS — Z9889 Other specified postprocedural states: Secondary | ICD-10-CM | POA: Insufficient documentation

## 2024-06-19 DIAGNOSIS — Z79899 Other long term (current) drug therapy: Secondary | ICD-10-CM | POA: Diagnosis not present

## 2024-06-19 DIAGNOSIS — D3A8 Other benign neuroendocrine tumors: Secondary | ICD-10-CM

## 2024-06-19 HISTORY — PX: ESOPHAGOGASTRODUODENOSCOPY: SHX5428

## 2024-06-19 SURGERY — EGD (ESOPHAGOGASTRODUODENOSCOPY)
Anesthesia: General

## 2024-06-19 MED ORDER — PROPOFOL 10 MG/ML IV BOLUS
INTRAVENOUS | Status: DC | PRN
  Administered 2024-06-19: 25 mg via INTRAVENOUS
  Administered 2024-06-19: 10 mg via INTRAVENOUS
  Administered 2024-06-19: 70 mg via INTRAVENOUS

## 2024-06-19 MED ORDER — LIDOCAINE HCL 1 % IJ SOLN
INTRAMUSCULAR | Status: DC | PRN
  Administered 2024-06-19: 50 mg via INTRADERMAL

## 2024-06-19 MED ORDER — STERILE WATER FOR IRRIGATION IR SOLN
Status: DC | PRN
  Administered 2024-06-19: 60 mL

## 2024-06-19 MED ORDER — LACTATED RINGERS IV SOLN: INTRAVENOUS | Status: DC

## 2024-06-19 NOTE — Anesthesia Postprocedure Evaluation (Signed)
 Anesthesia Post Note  Patient: [name]  Procedure(s) Performed: EGD (ESOPHAGOGASTRODUODENOSCOPY)  Patient location during evaluation: Short Stay Anesthesia Type: General Level of consciousness: awake and alert Pain management: pain level controlled Vital Signs Assessment: post-procedure vital signs reviewed and stable Respiratory status: spontaneous breathing Cardiovascular status: blood pressure returned to baseline and stable Postop Assessment: no apparent nausea or vomiting Anesthetic complications: no   No notable events documented.   Last Vitals:  Vitals:   06/19/24 0640 06/19/24 0802  BP: (!) 161/95 114/71  Pulse: 61 91  Resp: 20 20  Temp: 36.9 C 36.5 C  SpO2: 99% 100%    Last Pain:  Vitals:   06/19/24 0802  TempSrc: Oral  PainSc: 0-No pain                 Rinaldo Macqueen

## 2024-06-19 NOTE — Discharge Instructions (Signed)
 You are being discharged to home.  Resume your previous diet.  Your physician has recommended a repeat upper endoscopy for surveillance. Timing will depend on clinical progress and further discussion with oncology. Continue lanreotide injections.

## 2024-06-19 NOTE — Transfer of Care (Signed)
 Immediate Anesthesia Transfer of Care Note  Patient: [name]  Procedure(s) Performed: EGD (ESOPHAGOGASTRODUODENOSCOPY)  Patient Location: Short Stay  Anesthesia Type:General  Level of Consciousness: awake  Airway & Oxygen  Therapy: Patient Spontanous Breathing  Post-op Assessment: Report given to RN  Post vital signs: Reviewed  Last Vitals:  Vitals Value Taken Time  BP    Temp    Pulse    Resp    SpO2      Last Pain:  Vitals:   06/19/24 0740  TempSrc:   PainSc: 0-No pain         Complications: No notable events documented.

## 2024-06-19 NOTE — Op Note (Signed)
 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 Estimated Blood Loss:     Estimated blood loss: none. Procedure:                Pre-Anesthesia Assessment:                           - Prior to the procedure, a History and Physical                            was performed, and patient medications, allergies                            and sensitivities were reviewed. The patient's                            tolerance of previous anesthesia was reviewed.                           - The risks and benefits of the procedure and the                            sedation options and risks were discussed with the                            patient. All questions were answered and informed                            consent was obtained.                           - ASA Grade Assessment: III - A patient with severe                            systemic disease.                           After obtaining informed consent, the endoscope was                            passed under direct vision. Throughout the  procedure, the patient's blood pressure, pulse, and                            oxygen  saturations were monitored continuously. The                            [id number] ([id number]) Upper was introduced through                            the mouth, and advanced to the second part of                            duodenum. The upper GI endoscopy was accomplished                             without difficulty. The patient tolerated the                            procedure well. Scope In: 7:43:42 AM Scope Out: 7:52:57 AM Total Procedure Duration: 0 hours 9 minutes 15 seconds  Findings:      A 3 cm hiatal hernia was present.      The stomach was normal.      A single 12 mm submucosal nodule was found in the first portion of the       duodenum. The nodule was Paris classification Is (protruding, sessile).       This nodule correspond with known NET. Surface was slightluy eroded,       improved compared to prior. No active bleeding found. Impression:               - 3 cm hiatal hernia.                           - Normal stomach.                           - Submucosal nodule found in the duodenum,                            consistent with known NET.                           - No specimens collected. Moderate Sedation:      Per Anesthesia Care Recommendation:           - Discharge patient to home (ambulatory).                           - Resume previous diet.                           - Repeat upper endoscopy for surveillance will                            depend on clinical progress and further discussion  with oncology.                           - Continue lanreotide injections. Procedure Code(s):        --- Professional ---                           [phone], Esophagogastroduodenoscopy, flexible,                            transoral; diagnostic, including collection of                            specimen(s) by brushing or washing, when performed                            (separate procedure) Diagnosis Code(s):        --- Professional ---                           K44.9, Diaphragmatic hernia without obstruction or                            gangrene                           K31.89, Other diseases of stomach and duodenum                           D49.0, Neoplasm of unspecified behavior of                            digestive  system CPT copyright 2022 American Medical Association. All rights reserved. The codes documented in this report are preliminary and upon coder review may  be revised to meet current compliance requirements. Toribio Fortune, MD Toribio Fortune,  06/19/2024 8:03:06 AM This report has been signed electronically. Number of Addenda: 0

## 2024-06-19 NOTE — Interval H&P Note (Signed)
 History and Physical Interval Note:  06/19/2024 7:32 AM  [name]  has presented today for surgery, with the diagnosis of Duodenal neuroendocrine tumor.  The various methods of treatment have been discussed with the patient and family. After consideration of risks, benefits and other options for treatment, the patient has consented to  Procedure(s) with comments: EGD (ESOPHAGOGASTRODUODENOSCOPY) (N/A) - 7:30 am, asa 3 as a surgical intervention.  The patient's history has been reviewed, patient examined, no change in status, stable for surgery.  I have reviewed the patient's chart and labs.  Questions were answered to the patient's satisfaction.     [name]

## 2024-06-20 ENCOUNTER — Inpatient Hospital Stay: Attending: Hematology

## 2024-06-20 VITALS — BP 125/71 | HR 84 | Temp 98.2°F | Resp 18

## 2024-06-20 DIAGNOSIS — E3409 Other carcinoid syndrome: Secondary | ICD-10-CM

## 2024-06-20 DIAGNOSIS — C7A010 Malignant carcinoid tumor of the duodenum: Secondary | ICD-10-CM | POA: Diagnosis not present

## 2024-06-20 MED ORDER — LANREOTIDE ACETATE 120 MG/0.5ML ~~LOC~~ SOLN
120.0000 mg | Freq: Once | SUBCUTANEOUS | Status: AC
Start: 2024-06-20 — End: 2024-06-20
  Administered 2024-06-20: 120 mg via SUBCUTANEOUS
  Filled 2024-06-20: qty 120

## 2024-06-20 NOTE — Progress Notes (Signed)
 Patient presents today for Lanreotide. Patient tolerated injection in left gluteal with no complaints voiced.  Site clean and dry with no bruising or swelling noted.  No complaints of pain.  Discharged with vital signs stable and no signs or symptoms of distress noted.

## 2024-06-20 NOTE — Patient Instructions (Signed)
 [facility] - A DEPT OF [facility]  Discharge Instructions: Thank you for choosing [facility] to provide your oncology and hematology care.  If you have a lab appointment with the Cancer Center - please note that after April 8th, 2024, all labs will be drawn in the cancer center.  You do not have to check in or register with the main entrance as you have in the past but will complete your check-in in the cancer center.  Wear comfortable clothing and clothing appropriate for easy access to any Portacath or PICC line.   We strive to give you quality time with your provider. You may need to reschedule your appointment if you arrive late (15 or more minutes).  Arriving late affects you and other patients whose appointments are after yours.  Also, if you miss three or more appointments without notifying the office, you may be dismissed from the clinic at the provider's discretion.      For prescription refill requests, have your pharmacy contact our office and allow 72 hours for refills to be completed.    Today you received the following Lanreotide injection.  Lanreotide Injection What is this medication? LANREOTIDE (lan REE oh tide) treats high levels of growth hormone (acromegaly). It is used when other therapies have not worked well enough or cannot be tolerated. It works by reducing the amount of growth hormone your body makes. This reduces symptoms and the risk of health problems caused by too much growth hormone, such as diabetes and heart disease. It may also be used to treat neuroendocrine tumors, a cancer of the cells that release hormones and other substances in your body. It works by slowing down the release of these substances from the cells. This slows tumor growth. It also decreases the symptoms of carcinoid syndrome, such as flushing or diarrhea. This medicine may be used for other purposes; ask your health care provider or pharmacist if you  have questions. COMMON BRAND NAME(S): Somatuline Depot  What should I tell my care team before I take this medication? They need to know if you have any of these conditions: Diabetes Gallbladder disease Heart disease Kidney disease Liver disease Pancreatic disease Thyroid disease An unusual or allergic reaction to lanreotide, other medications, foods, dyes, or preservatives Pregnant or trying to get pregnant Breastfeeding How should I use this medication? This medication is injected under the skin. It is given by your care team in a hospital or clinic setting. Talk to your care team about the use of this medication in children. Special care may be needed. Overdosage: If you think you have taken too much of this medicine contact a poison control center or emergency room at once. NOTE: This medicine is only for you. Do not share this medicine with others. What if I miss a dose? Keep appointments for follow-up doses. It is important not to miss your dose. Call your care team if you are unable to keep an appointment. What may interact with this medication? Bromocriptine Cyclosporine Certain medications for blood pressure, heart disease, irregular heartbeat Certain medications for diabetes Quinidine Terfenadine This list may not describe all possible interactions. Give your health care provider a list of all the medicines, herbs, non-prescription drugs, or dietary supplements you use. Also tell them if you smoke, drink alcohol, or use illegal drugs. Some items may interact with your medicine. What should I watch for while using this medication? Visit your care team for regular checks  on your progress. Tell your care team if your symptoms do not start to get better or if they get worse. Your condition will be monitored carefully while you are receiving this medication. You may need blood work while you are taking this medication. This medication may increase blood sugar. The risk may be  higher in patients who already have diabetes. Ask your care team what you can do to lower your risk of diabetes while taking this medication. Talk to your care team if you wish to become pregnant or think you may be pregnant. This medication can cause serious birth defects. Do not breast-feed while taking this medication and for 6 months after stopping therapy. This medication may cause infertility. Talk to your care team if you are concerned about your fertility. What side effects may I notice from receiving this medication? Side effects that you should report to your care team as soon as possible: Allergic reactions--skin rash, itching, hives, swelling of the face, lips, tongue, or throat Gallbladder problems--severe stomach pain, nausea, vomiting, fever High blood sugar (hyperglycemia)--increased thirst or amount of urine, unusual weakness or fatigue, blurry vision Increase in blood pressure Low blood sugar (hypoglycemia)--pale, blue or purple skin or lips, sweating, fussiness, rapid heartbeat, poor feeding, low body temperature Low thyroid levels (hypothyroidism)--unusual weakness or fatigue, increased sensitivity to cold, constipation, hair loss, dry skin, weight gain, feelings of depression Oily or light-colored stools, diarrhea, bloating, weight loss Slow heartbeat--dizziness, feeling faint or lightheaded, confusion, trouble breathing, unusual weakness or fatigue Side effects that usually do not require medical attention (report these to your care team if they continue or are bothersome): Diarrhea Dizziness Headache Muscle spasms Nausea Pain, redness, or irritation at injection site Stomach pain This list may not describe all possible side effects. Call your doctor for medical advice about side effects. You may report side effects to FDA at 1-800-FDA-1088. Where should I keep my medication? This medication is given in a hospital or clinic. It will not be stored at home. NOTE: This  sheet is a summary. It may not cover all possible information. If you have questions about this medicine, talk to your doctor, pharmacist, or health care provider.  2024 Elsevier/Gold Standard (2023-09-14 00:00:00)  To help prevent nausea and vomiting after your treatment, we encourage you to take your nausea medication as directed.  BELOW ARE SYMPTOMS THAT SHOULD BE REPORTED IMMEDIATELY: *FEVER GREATER THAN 100.4 F (38 C) OR HIGHER *CHILLS OR SWEATING *NAUSEA AND VOMITING THAT IS NOT CONTROLLED WITH YOUR NAUSEA MEDICATION *UNUSUAL SHORTNESS OF BREATH *UNUSUAL BRUISING OR BLEEDING *URINARY PROBLEMS (pain or burning when urinating, or frequent urination) *BOWEL PROBLEMS (unusual diarrhea, constipation, pain near the anus) TENDERNESS IN MOUTH AND THROAT WITH OR WITHOUT PRESENCE OF ULCERS (sore throat, sores in mouth, or a toothache) UNUSUAL RASH, SWELLING OR PAIN  UNUSUAL VAGINAL DISCHARGE OR ITCHING   Items with * indicate a potential emergency and should be followed up as soon as possible or go to the Emergency Department if any problems should occur.  Please show the CHEMOTHERAPY ALERT CARD or IMMUNOTHERAPY ALERT CARD at check-in to the Emergency Department and triage nurse.  Should you have questions after your visit or need to cancel or reschedule your appointment, please contact [facility] [place] - A DEPT OF [facility] [phone]  and follow the prompts.  Office hours are 8:00 a.m. to 4:30 p.m. Monday - Friday. Please note that voicemails left after 4:00 p.m. may not be returned  until the following business day.  We are closed weekends and major holidays. You have access to a nurse at all times for urgent questions. Please call the main number to the clinic [phone] and follow the prompts.  For any non-urgent questions, you may also contact your provider using MyChart. We now offer e-Visits for anyone 50 and older to request care online for non-urgent  symptoms. For details visit [email or web address].   Also download the MyChart app! Go to the app store, search "MyChart", open the app, select [place], and log in with your MyChart username and password.

## 2024-06-23 ENCOUNTER — Encounter (HOSPITAL_COMMUNITY): Payer: Self-pay | Admitting: Gastroenterology

## 2024-06-24 ENCOUNTER — Encounter: Attending: Gastroenterology | Admitting: *Deleted

## 2024-06-24 VITALS — BP 133/71 | HR 59 | Temp 97.3°F | Resp 17

## 2024-06-24 DIAGNOSIS — K50018 Crohn's disease of small intestine with other complication: Secondary | ICD-10-CM | POA: Diagnosis not present

## 2024-06-24 MED ORDER — VEDOLIZUMAB 300 MG IV SOLR
300.0000 mg | Freq: Once | INTRAVENOUS | Status: AC
  Administered 2024-06-24: 300 mg via INTRAVENOUS
  Filled 2024-06-24: qty 5

## 2024-06-24 NOTE — Progress Notes (Signed)
 Diagnosis: Crohn's Disease  Provider:  Mariette Cree NP  Procedure: IV Infusion  IV Type: Peripheral, IV Location: L Hand  Entyvio  (Vedolizumab ), Dose: 300 mg  Infusion Start Time: 1342  Infusion Stop Time: 1418  Post Infusion IV Care: Observation period completed  Discharge: Condition: Good, Destination: Home . AVS Provided  Performed by:  Aldin Drees Ragsdale, RN

## 2024-06-30 DIAGNOSIS — M545 Low back pain, unspecified: Secondary | ICD-10-CM | POA: Diagnosis not present

## 2024-06-30 DIAGNOSIS — M533 Sacrococcygeal disorders, not elsewhere classified: Secondary | ICD-10-CM | POA: Diagnosis not present

## 2024-06-30 DIAGNOSIS — M47896 Other spondylosis, lumbar region: Secondary | ICD-10-CM | POA: Diagnosis not present

## 2024-06-30 DIAGNOSIS — M47816 Spondylosis without myelopathy or radiculopathy, lumbar region: Secondary | ICD-10-CM | POA: Diagnosis not present

## 2024-07-01 ENCOUNTER — Ambulatory Visit: Attending: Cardiology | Admitting: *Deleted

## 2024-07-01 DIAGNOSIS — F5104 Psychophysiologic insomnia: Secondary | ICD-10-CM | POA: Diagnosis not present

## 2024-07-01 DIAGNOSIS — D3A8 Other benign neuroendocrine tumors: Secondary | ICD-10-CM | POA: Diagnosis not present

## 2024-07-01 DIAGNOSIS — S32010S Wedge compression fracture of first lumbar vertebra, sequela: Secondary | ICD-10-CM | POA: Diagnosis not present

## 2024-07-01 DIAGNOSIS — Z5181 Encounter for therapeutic drug level monitoring: Secondary | ICD-10-CM | POA: Diagnosis not present

## 2024-07-01 DIAGNOSIS — I4891 Unspecified atrial fibrillation: Secondary | ICD-10-CM | POA: Diagnosis not present

## 2024-07-01 DIAGNOSIS — I10 Essential (primary) hypertension: Secondary | ICD-10-CM | POA: Diagnosis not present

## 2024-07-01 DIAGNOSIS — E039 Hypothyroidism, unspecified: Secondary | ICD-10-CM | POA: Diagnosis not present

## 2024-07-01 DIAGNOSIS — J309 Allergic rhinitis, unspecified: Secondary | ICD-10-CM | POA: Diagnosis not present

## 2024-07-01 DIAGNOSIS — W19XXXA Unspecified fall, initial encounter: Secondary | ICD-10-CM | POA: Diagnosis not present

## 2024-07-01 DIAGNOSIS — K2100 Gastro-esophageal reflux disease with esophagitis, without bleeding: Secondary | ICD-10-CM | POA: Diagnosis not present

## 2024-07-01 DIAGNOSIS — K5090 Crohn's disease, unspecified, without complications: Secondary | ICD-10-CM | POA: Diagnosis not present

## 2024-07-01 DIAGNOSIS — E782 Mixed hyperlipidemia: Secondary | ICD-10-CM | POA: Diagnosis not present

## 2024-07-01 LAB — POCT INR: INR: 2 (ref 2.0–3.0)

## 2024-07-01 NOTE — Progress Notes (Signed)
 INR 2.0 Please see anticoagulation encounter.

## 2024-07-01 NOTE — Patient Instructions (Signed)
 Continue warfarin 1 tablet daily except 1/2 tablet on Mondays and Thursdays Recheck in 3 weeks

## 2024-07-09 ENCOUNTER — Ambulatory Visit (INDEPENDENT_AMBULATORY_CARE_PROVIDER_SITE_OTHER): Admitting: Otolaryngology

## 2024-07-09 ENCOUNTER — Encounter (INDEPENDENT_AMBULATORY_CARE_PROVIDER_SITE_OTHER): Payer: Self-pay | Admitting: Otolaryngology

## 2024-07-09 VITALS — BP 121/79 | HR 68 | Temp 98.0°F

## 2024-07-09 DIAGNOSIS — R0981 Nasal congestion: Secondary | ICD-10-CM

## 2024-07-09 DIAGNOSIS — H6980 Other specified disorders of Eustachian tube, unspecified ear: Secondary | ICD-10-CM | POA: Diagnosis not present

## 2024-07-09 DIAGNOSIS — J310 Chronic rhinitis: Secondary | ICD-10-CM

## 2024-07-09 DIAGNOSIS — R42 Dizziness and giddiness: Secondary | ICD-10-CM | POA: Diagnosis not present

## 2024-07-09 DIAGNOSIS — M533 Sacrococcygeal disorders, not elsewhere classified: Secondary | ICD-10-CM | POA: Diagnosis not present

## 2024-07-09 DIAGNOSIS — H6121 Impacted cerumen, right ear: Secondary | ICD-10-CM

## 2024-07-09 DIAGNOSIS — H6983 Other specified disorders of Eustachian tube, bilateral: Secondary | ICD-10-CM

## 2024-07-09 DIAGNOSIS — H903 Sensorineural hearing loss, bilateral: Secondary | ICD-10-CM | POA: Diagnosis not present

## 2024-07-09 NOTE — Progress Notes (Unsigned)
 Patient ID: [name], male   DOB: 26-Oct-1930, 89 y.o.   MRN: [id number]  Follow-up: Chronic dizziness, chronic nasal congestion, eustachian tube dysfunction, bilateral tinnitus, bilateral hearing loss  HPI:  The patient is a 89 year old male who returns today for his follow-up evaluation.  The patient was previously seen for multiple medical issues, including recurrent dizziness, chronic nasal congestion, eustachian tube dysfunction, hearing loss, and tinnitus.  He has been experiencing a constant lightheaded and off-balance sensation. He has a tendency to veer to the right while walking forward. The patient has multiple cardiovascular risk factors, including atrial fibrillation and multiple recent abdominal, urologic, and spine surgeries. The patient underwent vestibular neurodiagnostic testing. He was noted to have bilateral peripheral vestibular weakness in addition to central vestibular dysfunction. He was treated with physical therapy.  The patient reports persistent balance difficulty.  He denies any spinning vertigo.  He is currently using a cane. The patient also complains of clogging and popping sensation in his ears.  He has occasional nasal congestion.  He has been using Flonase  nasal spray.  The patient was previously fitted with bilateral [facility] aids [facility].   He currently denies any otalgia or otorrhea. No other ENT, GI, or respiratory issue noted since the last visit.   Exam: General: Communicates without difficulty, well nourished, no acute distress. Head: Normocephalic, no evidence injury, no tenderness, facial buttresses intact without stepoff. Face/sinus: No tenderness to palpation and percussion. Facial movement is normal and symmetric. Eyes: PERRL, EOMI. No scleral icterus, conjunctivae clear. Neuro: CN II exam reveals vision grossly intact.  No nystagmus at any point of gaze. Ears: Auricles well formed without lesions.  Right ear cerumen impaction.  The left ear canal and  tympanic membrane are normal.  Nose: External evaluation reveals normal support and skin without lesions.  Dorsum is intact.  Anterior rhinoscopy reveals congested mucosa over anterior aspect of inferior turbinates and intact septum.  No purulence noted. Oral:  Oral cavity and oropharynx are intact, symmetric, without erythema or edema.  Mucosa is moist without lesions. Neck: Full range of motion without pain.  There is no significant lymphadenopathy.  No masses palpable.  Thyroid  bed within normal limits to palpation.  Parotid glands and submandibular glands equal bilaterally without mass.  Trachea is midline. Neuro:  CN 2-12 grossly intact.   Procedure: Right ear cerumen disimpaction Anesthesia: None Description: Under the operating microscope, the cerumen is carefully removed with a combination of cerumen currette, alligator forceps, and suction catheters.  After the cerumen is removed, the TMs are noted to be normal.  No mass, erythema, or lesions. The patient tolerated the procedure well.    Assessment: 1.  Progressive bilateral high-frequency sensorineural hearing loss. 2.  Clinical eustachian tube dysfunction. 3.  Right ear cerumen impaction.  After the disimpaction procedure, both tympanic membranes and middle ear spaces are noted to be normal. 4.  The patient's dizziness is likely multifactorial, with possible contribution from his atrial fibrillation and cardiovascular factors.  He was also previously noted to have bilateral peripheral vestibular weakness and central vestibular dysfunction. 5.  Chronic rhinitis with nasal mucosal congestion.  Plan: 1.  The physical exam findings are reviewed with the patient.  2.  Otomicroscopy with right ear cerumen disimpaction. 3.  Outpatient hearing test.  No audiologist is available today. 4.  Continue with Flonase  nasal spray and Valsalva exercise. 5.  The patient is encouraged to continue his vestibular exercises at home on a consistent daily  basis. 6.  Continue the use of his hearing aids. 7.  The patient will return for reevaluation in 1 year.

## 2024-07-10 ENCOUNTER — Ambulatory Visit (INDEPENDENT_AMBULATORY_CARE_PROVIDER_SITE_OTHER): Admitting: Gastroenterology

## 2024-07-10 ENCOUNTER — Encounter (INDEPENDENT_AMBULATORY_CARE_PROVIDER_SITE_OTHER): Payer: Self-pay | Admitting: Gastroenterology

## 2024-07-10 VITALS — BP 143/87 | HR 86 | Temp 97.8°F | Ht 68.0 in | Wt 150.5 lb

## 2024-07-10 DIAGNOSIS — H903 Sensorineural hearing loss, bilateral: Secondary | ICD-10-CM | POA: Insufficient documentation

## 2024-07-10 DIAGNOSIS — J310 Chronic rhinitis: Secondary | ICD-10-CM | POA: Insufficient documentation

## 2024-07-10 DIAGNOSIS — K50018 Crohn's disease of small intestine with other complication: Secondary | ICD-10-CM | POA: Diagnosis not present

## 2024-07-10 DIAGNOSIS — H6983 Other specified disorders of Eustachian tube, bilateral: Secondary | ICD-10-CM | POA: Insufficient documentation

## 2024-07-10 DIAGNOSIS — C7A010 Malignant carcinoid tumor of the duodenum: Secondary | ICD-10-CM

## 2024-07-10 DIAGNOSIS — E3409 Other carcinoid syndrome: Secondary | ICD-10-CM

## 2024-07-10 DIAGNOSIS — H6121 Impacted cerumen, right ear: Secondary | ICD-10-CM | POA: Insufficient documentation

## 2024-07-10 NOTE — Patient Instructions (Addendum)
-  Continue Entyvio  every 8 weeks -Continue with lanreotide injections monthly -Repeat EGD in mid December

## 2024-07-10 NOTE — Progress Notes (Signed)
 Toribio Fortune, M.D. Gastroenterology & Hepatology [facility] Gastroenterology [address] [place], [place] [address]  Primary Care Physician: Atilano Deward ORN, MD [address] [place] [address]  I will communicate my assessment and recommendations to the referring MD via EMR.  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 difficile colonization   History of Present Illness: TYMIR TERRAL is a 89 y.o. male with past medical history of Afib on coumadin , CAD, GERD, history of C. difficile, B12 deficiency, hypothyroidism, Crohn's disease s/p small bowel resection, duodenal well-differentiated neuroendocrine tumor, who presents for follow up of Crohn's disease and carcinoid.  The patient was last seen on 06/05/2024. At that time, the patient was continued on Entyvio  and lanreotide injections.  As he was having some occasional diarrhea, he had stool testing which came back positive for C. difficile but as his diarrhea stopped, it was considered it was a false positive/colonization.  States he is having 2 mushy bowel movements per day. He denies having any other issues. The patient denies having any nausea, vomiting, fever, chills, hematochezia, melena, hematemesis, abdominal distention, abdominal pain, diarrhea, jaundice, pruritus or weight loss.  5 HIAA on 5/28 was 2.1  Chromogranin A on 04/24/2024 was 215 Patient was seen by Dr. Rogers on 04/24/2024.  Patient was started on lanreotide on 03/27/2024 every 4 weeks as the patient is a poor candidate for surgical resection and had eroded surface.  Was recommended to have repeat esophagogastroduodenospy to assess if eroded surface had resolved in the future but timing was difficult to assess.   PET scan: 03/18/24 Focal activity in the second portion duodenum consistent with Well differentiated neuroendocrine tumor. 2. Moderate radiotracer activity  in tail the pancreas is also concerning for neuroendocrine tumor. Activity is slightly less than the duodenal lesion. No clear lesion identified on CT. 3. No metastatic adenopathy or mesenteric metastasis. 4. No liver metastasis  Last EGD: 06/2024  A 3 cm hiatal hernia was present. The stomach was normal. A single 12 mm submucosal nodule was found in the first portion of the duodenum. The nodule was Paris classification Is ( protruding, sessile) . This nodule correspond with known NET.  Last Colonoscopy: 03/23/2016 - Two small polyps at the recto-sigmoid colon and in the cecum, removed with a cold snare. Resected and retrieved. - Mild diverticulosis in the sigmoid colon. - External and internal hemorrhoids  Past Medical History: Past Medical History:  Diagnosis Date   Anemia    Atrial fibrillation (HCC)    BPH (benign prostatic hyperplasia)    CAD (coronary artery disease)    Catheterization 2004, mild/moderate nonobstructive disease  /   nuclear, 2007, small inferior scar // no ischemia   Crohn's disease (HCC)    Elevated PSA    GERD (gastroesophageal reflux disease)    History of kidney stones    History of pneumonia    Hypothyroidism    Mitral regurgitation    Pneumonia    SBO (small bowel obstruction) (HCC)    Urinary retention    Wears glasses     Past Surgical History: Past Surgical History:  Procedure Laterality Date   AGILE CAPSULE  10/18/2011   Procedure: AGILE CAPSULE;  Surgeon: Claudis RAYMOND Rivet, MD;  [facility];  Service: Endoscopy;  Laterality: N/A;  730   BIOPSY  10/05/2022   Procedure: BIOPSY;  Surgeon: Fortune Angelia Toribio, MD;  [facility];  Service: Gastroenterology;;  BIOPSY  01/02/2023   Procedure: BIOPSY;  Surgeon: Eartha Angelia Sieving, MD;  [facility];  Service: Gastroenterology;;   CARDIAC CATHETERIZATION  2004   CATARACT EXTRACTION W/PHACO Right 06/27/2021   Procedure: CATARACT EXTRACTION PHACO AND  INTRAOCULAR LENS PLACEMENT (IOC);  Surgeon: Harrie Agent, MD;  Location: [facility];  Service: Ophthalmology;  Laterality: Right;  CDE 21.98   CATARACT EXTRACTION W/PHACO Left 07/11/2021   Procedure: CATARACT EXTRACTION PHACO AND INTRAOCULAR LENS PLACEMENT LEFT EYE;  Surgeon: Harrie Agent, MD;  Location: [facility];  Service: Ophthalmology;  Laterality: Left;  CDE=10.42   CHOLECYSTECTOMY  2010   Dr. DeMason   COLONOSCOPY  2008   DeMason   COLONOSCOPY N/A 03/23/2016   Procedure: COLONOSCOPY;  Surgeon: Claudis RAYMOND Rivet, MD;  [facility];  Service: Endoscopy;  Laterality: N/A;  1:00   CYSTOSCOPY WITH INSERTION OF UROLIFT     ESOPHAGEAL BRUSHING  10/05/2022   Procedure: ESOPHAGEAL BRUSHING;  Surgeon: Eartha Angelia Sieving, MD;  [facility];  Service: Gastroenterology;;   ESOPHAGOGASTRODUODENOSCOPY  11/08/2011   Procedure: ESOPHAGOGASTRODUODENOSCOPY (EGD);  Surgeon: Claudis RAYMOND Rivet, MD;  [facility];  Service: Endoscopy;  Laterality: N/A;  300   ESOPHAGOGASTRODUODENOSCOPY N/A 02/15/2024   Procedure: EGD (ESOPHAGOGASTRODUODENOSCOPY);  Surgeon: Eartha Angelia, Sieving, MD;  [facility];  Service: Gastroenterology;  Laterality: N/A;  2:00PM;ASA 3   ESOPHAGOGASTRODUODENOSCOPY N/A 06/19/2024   Procedure: EGD (ESOPHAGOGASTRODUODENOSCOPY);  Surgeon: Eartha Angelia, Sieving, MD;  [facility];  Service: Gastroenterology;  Laterality: N/A;  7:30 am, asa 3   ESOPHAGOGASTRODUODENOSCOPY (EGD) WITH PROPOFOL  N/A 10/05/2022   Procedure: ESOPHAGOGASTRODUODENOSCOPY (EGD) WITH PROPOFOL ;  Surgeon: Eartha Angelia Sieving, MD;  [facility];  Service: Gastroenterology;  Laterality: N/A;   ESOPHAGOGASTRODUODENOSCOPY (EGD) WITH PROPOFOL  N/A 01/02/2023   Procedure: ESOPHAGOGASTRODUODENOSCOPY (EGD) WITH PROPOFOL ;  Surgeon: Eartha Angelia Sieving, MD;  [facility];  Service: Gastroenterology;  Laterality: N/A;  1:30 pm, asa 3   HEMOSTASIS CLIP  PLACEMENT  10/05/2022   Procedure: HEMOSTASIS CLIP PLACEMENT;  Surgeon: Eartha Angelia Sieving, MD;  [facility];  Service: Gastroenterology;;   IR KYPHO LUMBAR INC FX REDUCE BONE BX UNI/BIL CANNULATION INC/IMAGING  05/27/2024   IR RADIOLOGIST EVAL & MGMT  05/14/2024   IR RADIOLOGIST EVAL & MGMT  06/12/2024   POLYPECTOMY  03/23/2016   Procedure: POLYPECTOMY;  Surgeon: Claudis RAYMOND Rivet, MD;  [facility];  Service: Endoscopy;;  Cecal polyp removed via cold forceps recto-sigmoid polyp removed via cold snare   POLYPECTOMY  10/05/2022   Procedure: POLYPECTOMY;  Surgeon: Eartha Angelia Sieving, MD;  [facility];  Service: Gastroenterology;;   TRANSURETHRAL RESECTION OF PROSTATE N/A 04/29/2020   Procedure: TRANSURETHRAL RESECTION OF THE PROSTATE (TURP);  Surgeon: Matilda Senior, MD;  Location: [facility];  Service: Urology;  Laterality: N/A;  90 MINS    Family History: Family History  Problem Relation Age of Onset   Colon cancer Mother    Heart disease Father    Stroke Brother    Healthy Daughter     Social History: Social History   Tobacco Use  Smoking Status Never   Passive exposure: Never  Smokeless Tobacco Never   Social History   Substance and Sexual Activity  Alcohol  Use No   Alcohol /week: 0.0 standard drinks of alcohol    Social History   Substance and Sexual Activity  Drug Use No    Allergies: No Known Allergies  Medications:  Current Outpatient Medications  Medication Sig Dispense Refill   acetaminophen  (TYLENOL ) 500 MG tablet Take 1,000 mg by mouth every 6 (six) hours as needed for mild pain (pain score 1-3) or moderate pain (pain score 4-6).     CALCIUM  PO Take 300 mg by mouth daily after lunch.     Cholecalciferol (VITAMIN D3) 50 MCG (2000 UT) TABS Take 2,000 Units by mouth daily.     cyanocobalamin  (VITAMIN B12) 1000 MCG tablet Take 1,000 mcg by mouth daily. (Patient taking differently: Take 1,000 mcg by  mouth daily. Every other day)     diltiazem  (CARDIZEM  CD) 360 MG 24 hr capsule TAKE ONE CAPSULE BY MOUTH DAILY 90 capsule 1   levothyroxine  (SYNTHROID , LEVOTHROID) 75 MCG tablet Take 75 mcg by mouth daily before breakfast.     Magnesium  Glycinate 100 MG CAPS Take 3 tablets by mouth daily.     Melatonin 5 MG CAPS Take by mouth. (Patient taking differently: Take 5 mg by mouth at bedtime.)     metoprolol  succinate (TOPROL -XL) 50 MG 24 hr tablet TAKE 1 TABLET BY MOUTH DAILY (EMERGENCY REFILL FAXED DR.) 90 tablet 1   nitroGLYCERIN  (NITROSTAT ) 0.4 MG SL tablet Place 0.4 mg under the tongue every 5 (five) minutes as needed for chest pain. May repeat for up to 3 doses.     omeprazole  (PRILOSEC) 40 MG capsule TAKE ONE CAPSULE BY MOUTH EVERY DAY 90 capsule 1   ondansetron  (ZOFRAN ) 4 MG tablet Take 1 tablet (4 mg total) by mouth every 8 (eight) hours as needed for nausea or vomiting. 90 tablet 1   Probiotic Product (ALIGN) 4 MG CAPS Take 4 mg by mouth in the morning.     traZODone  (DESYREL ) 50 MG tablet Take 50 mg by mouth at bedtime.     Vedolizumab  (ENTYVIO  IV) Inject into the vein. Every 8 weeks.     warfarin (COUMADIN ) 2.5 MG tablet TAKE 1 TO 1 & 1/2 TABLETS BY MOUTH DAILY OR AS DIRECTED by coumadin  clinic (Patient taking differently: Take 2.5 mg by mouth See admin instructions. 1/2 tablets on Mondays and Thursdays . 1 tablet on all other days) 45 tablet 5   No current facility-administered medications for this visit.    Review of Systems: GENERAL: negative for malaise, night sweats HEENT: No changes in hearing or vision, no nose bleeds or other nasal problems. NECK: Negative for lumps, goiter, pain and significant neck swelling RESPIRATORY: Negative for cough, wheezing CARDIOVASCULAR: Negative for chest pain, leg swelling, palpitations, orthopnea GI: SEE HPI MUSCULOSKELETAL: Negative for joint pain or swelling, back pain, and muscle pain. SKIN: Negative for lesions, rash PSYCH: Negative for sleep  disturbance, mood disorder and recent psychosocial stressors. HEMATOLOGY Negative for prolonged bleeding, bruising easily, and swollen nodes. ENDOCRINE: Negative for cold or heat intolerance, polyuria, polydipsia and goiter. NEURO: negative for tremor, gait imbalance, syncope and seizures. The remainder of the review of systems is noncontributory.   Physical Exam: BP (!) 143/87 (BP Location: Left Arm, Patient Position: Sitting, Cuff Size: Normal)   Pulse 86   Temp 97.8 F (36.6 C) (Temporal)   Ht 5' 8 (1.727 m)   Wt 150 lb 8 oz (68.3 kg)   BMI 22.88 kg/m  GENERAL: The patient is AO x3, in no acute distress. HEENT: Head is normocephalic and atraumatic. EOMI are intact. Mouth is well hydrated and without lesions. NECK: Supple. No masses LUNGS: Clear to auscultation. No presence of rhonchi/wheezing/rales. Adequate chest expansion HEART: RRR, normal s1 and s2.  ABDOMEN: Soft, nontender, no guarding, no peritoneal signs, and nondistended. BS +. No masses. EXTREMITIES: Without any cyanosis, clubbing, rash, lesions or edema. NEUROLOGIC: AOx3, no focal motor deficit. SKIN: no jaundice, no rashes  Imaging/Labs: as above  I personally reviewed and interpreted the available labs, imaging and endoscopic files.  Impression and Plan: MAHLON GABRIELLE is a 89 y.o. male with past medical history of Afib on coumadin , CAD, GERD, history of C. difficile, B12 deficiency, hypothyroidism, Crohn's disease s/p small bowel resection, duodenal well-differentiated neuroendocrine tumor, who presents for follow up of Crohn's disease and carcinoid.  Patient has complex gastrointestinal comorbidities.  His most active medical issue is the presence of a well-differentiated neuroendocrine tumor in the duodenum, for which he has been receiving lanreotide injections monthly with oncology.  He has tolerated this medication adequately and underwent endoscopic surveillance recently that showed presence of stable tumor with  mildly improved erosion in the surface of the tumor.  Due to this, after discussion with oncology, we continued with lanreotide to decrease the risk of growth and potential bleeding from this area.  We will repeat an EGD in mid December to assess response to treatment and need to continue these injections.  Fortunately, his Crohn's disease has appeared to respond to Entyvio  and he denies any ongoing gastrointestinal complaints.  Most recent labs appear to be adequate.  Will continue him on Entyvio  every 8 hours.  -Continue Entyvio  every 8 weeks -Continue with lanreotide injections monthly -Repeat EGD in mid December  All questions were answered.      Toribio Fortune, MD Gastroenterology and Hepatology [facility] Gastroenterology

## 2024-07-11 ENCOUNTER — Other Ambulatory Visit: Payer: Self-pay

## 2024-07-11 ENCOUNTER — Inpatient Hospital Stay

## 2024-07-11 DIAGNOSIS — C7A010 Malignant carcinoid tumor of the duodenum: Secondary | ICD-10-CM | POA: Diagnosis not present

## 2024-07-11 DIAGNOSIS — D3A8 Other benign neuroendocrine tumors: Secondary | ICD-10-CM

## 2024-07-11 LAB — CBC WITH DIFFERENTIAL/PLATELET
Abs Immature Granulocytes: 0.08 K/uL — ABNORMAL HIGH (ref 0.00–0.07)
Basophils Absolute: 0 K/uL (ref 0.0–0.1)
Basophils Relative: 0 %
Eosinophils Absolute: 0 K/uL (ref 0.0–0.5)
Eosinophils Relative: 0 %
HCT: 38.2 % — ABNORMAL LOW (ref 39.0–52.0)
Hemoglobin: 12.6 g/dL — ABNORMAL LOW (ref 13.0–17.0)
Immature Granulocytes: 1 %
Lymphocytes Relative: 8 %
Lymphs Abs: 1.4 K/uL (ref 0.7–4.0)
MCH: 30.3 pg (ref 26.0–34.0)
MCHC: 33 g/dL (ref 30.0–36.0)
MCV: 91.8 fL (ref 80.0–100.0)
Monocytes Absolute: 0.9 K/uL (ref 0.1–1.0)
Monocytes Relative: 6 %
Neutro Abs: 14.2 K/uL — ABNORMAL HIGH (ref 1.7–7.7)
Neutrophils Relative %: 85 %
Platelets: 223 K/uL (ref 150–400)
RBC: 4.16 MIL/uL — ABNORMAL LOW (ref 4.22–5.81)
RDW: 14.7 % (ref 11.5–15.5)
WBC: 16.6 K/uL — ABNORMAL HIGH (ref 4.0–10.5)
nRBC: 0 % (ref 0.0–0.2)

## 2024-07-11 LAB — COMPREHENSIVE METABOLIC PANEL WITH GFR
ALT: 26 U/L (ref 0–44)
AST: 33 U/L (ref 15–41)
Albumin: 3.8 g/dL (ref 3.5–5.0)
Alkaline Phosphatase: 77 U/L (ref 38–126)
Anion gap: 13 (ref 5–15)
BUN: 31 mg/dL — ABNORMAL HIGH (ref 8–23)
CO2: 23 mmol/L (ref 22–32)
Calcium: 8.8 mg/dL — ABNORMAL LOW (ref 8.9–10.3)
Chloride: 103 mmol/L (ref 98–111)
Creatinine, Ser: 1.43 mg/dL — ABNORMAL HIGH (ref 0.61–1.24)
GFR, Estimated: 46 mL/min — ABNORMAL LOW (ref >60)
Glucose, Bld: 114 mg/dL — ABNORMAL HIGH (ref 70–99)
Potassium: 3.6 mmol/L (ref 3.5–5.1)
Sodium: 139 mmol/L (ref 135–145)
Total Bilirubin: 0.8 mg/dL (ref 0.0–1.2)
Total Protein: 6.7 g/dL (ref 6.5–8.1)

## 2024-07-14 LAB — CHROMOGRANIN A: Chromogranin A (ng/mL): 124.3 ng/mL — ABNORMAL HIGH (ref 0.0–101.8)

## 2024-07-15 ENCOUNTER — Ambulatory Visit (INDEPENDENT_AMBULATORY_CARE_PROVIDER_SITE_OTHER): Admitting: Otolaryngology

## 2024-07-15 DIAGNOSIS — I10 Essential (primary) hypertension: Secondary | ICD-10-CM | POA: Diagnosis not present

## 2024-07-15 DIAGNOSIS — E782 Mixed hyperlipidemia: Secondary | ICD-10-CM | POA: Diagnosis not present

## 2024-07-15 DIAGNOSIS — I4891 Unspecified atrial fibrillation: Secondary | ICD-10-CM | POA: Diagnosis not present

## 2024-07-15 DIAGNOSIS — K2100 Gastro-esophageal reflux disease with esophagitis, without bleeding: Secondary | ICD-10-CM | POA: Diagnosis not present

## 2024-07-17 NOTE — Progress Notes (Signed)
 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 male was transferred to my care after his prior physician has left.   ASSESSMENT & PLAN:   Assessment & Plan: [name]  is a 89 y.o. male with well-differentiated carcinoid of duodenum  Assessment and Plan Assessment & Plan Well-differentiated duodenal neuroendocrine tumor Oncology history below Tumor well-managed with lanreotide.  Chromogranin A levels decreasing, no new symptoms. Recent endoscopy with 12 mm submucosal nodule in the first portion of duodenum.  - Continue lanreotide injections monthly. - Schedule endoscopies every six months. -Labs reviewed today: CBC: WBC: 16.6, hemoglobin: 12.6, platelets: 223, CMP: Creatinine: 1.43, normal LFTs - Will administer today's scheduled injection. -Patient is wishing to transfer his care to [place] as they are moving locations.  I recommended patient's wife to call us  when they decide on a new hematology oncology office and we will send a new referral over. - Will continue monthly lanreotide injections here until they see a new oncologist.  Recommended patient to reach out to us  with the above information.  Leukocytosis Leukocytosis with high WBC count, no infection signs. Recent steroid injection may contribute.  - Monitor white blood cell count. - No immediate action unless infection symptoms develop.  Chronic kidney disease, unspecified stage Kidney function improving, requires regular monitoring.  - Continue monitoring kidney function through regular lab tests.  Thrombocytopenia Previously thought to be due to anemia.  Ultrasound abdomen showed no splenomegaly Resolved at this time  Crohn's disease Patient has a  history of Crohn's disease and is well-controlled with Entyvio .  -Continue close follow-up with GI  Relocation and Coordination of Care Relocation to [facility] in mid-November requires coordination for treatment continuity. - Send referral to a hematologist/oncologist in [place] once the family decides where the patient would like to transition of his care to. - Ensure next patient scheduled for November 4th is completed before moving. - Provided with contact information for further assistance and coordination.   The patient understands the plans discussed today and is in agreement with them.  He knows to contact our office if he develops concerns prior to his next appointment.  60 minutes of total time was spent for this patient encounter, including preparation,review of records,  face-to-face counseling with the patient and coordination of care, physical exam, and documentation of the encounter.    LILLETTE Verneta SAUNDERS Teague,acting as a Neurosurgeon for Mickiel Dry, MD.,have documented all relevant documentation on the behalf of Mickiel Dry, MD,as directed by  Mickiel Dry, MD while in the presence of Mickiel Dry, MD.  I, Mickiel Dry MD, have reviewed the above documentation for accuracy and completeness, and I agree with the above.    Mickiel Dry, MD  [facility] [place] - A DEPT OF [facility] [address] [place] [address] Dept: [phone] Dept Fax: [phone]   No orders of the defined types were placed in this encounter.    ONCOLOGY HISTORY:   I have reviewed his chart and materials related to his cancer extensively and collaborated history with the patient. Summary of oncologic history is as follows:  Diagnosis: Well-differentiated carcinoid of the second part of duodenum   -10/05/2022: EGD: A  single 4 mm sessile polyp in the second portion of the duodenum. Resected. Nodular mucosa in the second  portion of the duodenum. Biopsied.  Pathology of nodular duodenal area: Well-differentiated neuroendocrine tumor (carcinoid tumor), measures 2 mm in greatest dimension. The tumor invades the lamina propria and diffusely involves of the muscularis mucosa extending into the submucosa to involve the deep margin of these biopsies. The tumor is diffusely and strongly positive for synaptophysin and chromogranin. Ki-67 < 1%. -10/26/2022: Chromogranin A: 1,329.0 -01/02/2023: EGD: A single 15 mm submucosal module in the first portion of the duodenum. Biopsied. Pathology: Well-differentiated neuroendocrine tumor, WHO grade 1. Tumor present at submucosal margin. -12/06/2023: CT [facility]:  No acute intra-abdominal or pelvic pathology. Colonic diverticulosis. -02/15/2024: EGD: A single 15 mm submucosal nodule in the first portion of the duodenum.  -03/18/2024: Dotatate PET: Focal activity in the second portion duodenum consistent with Well differentiated neuroendocrine tumor. Moderate radiotracer activity in tail the pancreas is also concerning for neuroendocrine tumor. Activity is slightly less than the duodenal lesion. No clear lesion identified on CT. No metastatic adenopathy or mesenteric metastasis. No liver metastasis. -03/12/2024: 24-hour urine 5-HIAA: Normal.  -Patient evaluated by surgery and felt not to be surgical candidate- per documentation -03/27/2024-current: Lanreotide 120 mg every 4 weeks -06/19/2024: EGD: A single 12 mm submucosal nodule in the first portion of the duodenum. -07/11/2024: Chromogranin A: 124.3  Current Treatment:  Lanreotide 120 mg every 4 weeks  INTERVAL HISTORY:   Discussed the use of AI scribe software for clinical note transcription with the patient, who gave verbal consent to proceed.  History of Present Illness [name] is a 89 year old male with a well-differentiated duodenal tumor who presents for follow-up and treatment management.Patient is accompanied by his wife  today.   He has a well-differentiated tumor in the duodenum, currently managed with lanreotide injections. No diarrhea, nausea, or vomiting.  He experiences fatigue, which he attributes to a hip problem. Standing for extended periods causes discomfort, but he is pain-free when sitting or lying down. No swelling or difficulty breathing.  Recent lab work shows an elevated white blood cell count. He denies any recent fevers, colds, or coughs. He received a steroid injection in his hip on July 09, 2024.  He is currently receiving Entyvio  infusions, with the next scheduled for August 19, 2024.    I have reviewed the past medical history, past surgical history, social history and family history with the patient and they are unchanged from previous note.  ALLERGIES:  has no known allergies.  MEDICATIONS:  Current Outpatient Medications  Medication Sig Dispense Refill   acetaminophen  (TYLENOL ) 500 MG tablet Take 1,000 mg by mouth every 6 (six) hours as needed for mild pain (pain score 1-3) or moderate pain (pain score 4-6).     CALCIUM  PO Take 300 mg by mouth daily after lunch.     Cholecalciferol (VITAMIN D3) 50 MCG (2000 UT) TABS Take 2,000 Units by mouth daily.     cyanocobalamin  (VITAMIN B12) 1000 MCG tablet Take 1,000 mcg by mouth daily. (Patient taking differently: Take 1,000 mcg by mouth daily. Every other day)     diltiazem  (CARDIZEM  CD) 360 MG 24 hr capsule TAKE ONE CAPSULE BY MOUTH DAILY 90 capsule 1   levothyroxine  (SYNTHROID , LEVOTHROID) 75 MCG tablet Take 75 mcg by mouth daily before breakfast.     Magnesium  Glycinate 100 MG CAPS Take 3 tablets by mouth daily.     Melatonin 5 MG CAPS Take by  mouth. (Patient taking differently: Take 5 mg by mouth at bedtime.)     metoprolol  succinate (TOPROL -XL) 50 MG 24 hr tablet TAKE 1 TABLET BY MOUTH DAILY (EMERGENCY REFILL FAXED DR.) 90 tablet 1   nitroGLYCERIN  (NITROSTAT ) 0.4 MG SL tablet Place 0.4 mg under the tongue every 5 (five) minutes  as needed for chest pain. May repeat for up to 3 doses.     omeprazole  (PRILOSEC) 40 MG capsule TAKE ONE CAPSULE BY MOUTH EVERY DAY 90 capsule 1   ondansetron  (ZOFRAN ) 4 MG tablet Take 1 tablet (4 mg total) by mouth every 8 (eight) hours as needed for nausea or vomiting. 90 tablet 1   Probiotic Product (ALIGN) 4 MG CAPS Take 4 mg by mouth in the morning.     traZODone  (DESYREL ) 50 MG tablet Take 50 mg by mouth at bedtime.     Vedolizumab  (ENTYVIO  IV) Inject into the vein. Every 8 weeks.     warfarin (COUMADIN ) 2.5 MG tablet TAKE 1 TO 1 & 1/2 TABLETS BY MOUTH DAILY OR AS DIRECTED by coumadin  clinic (Patient taking differently: Take 2.5 mg by mouth See admin instructions. 1/2 tablets on Mondays and Thursdays . 1 tablet on all other days) 45 tablet 5   No current facility-administered medications for this visit.    REVIEW OF SYSTEMS:   Constitutional: Denies fevers, chills or abnormal weight loss Eyes: Denies blurriness of vision Ears, nose, mouth, throat, and face: Denies mucositis or sore throat Respiratory: Denies cough, dyspnea or wheezes Cardiovascular: Denies palpitation, chest discomfort or lower extremity swelling Gastrointestinal:  Denies nausea, heartburn or change in bowel habits Skin: Denies abnormal skin rashes Lymphatics: Denies new lymphadenopathy or easy bruising Neurological:Denies numbness, tingling or new weaknesses Behavioral/Psych: Mood is stable, no new changes  All other systems were reviewed with the patient and are negative.   VITALS:  Blood pressure 126/68, pulse (!) 56, temperature 97.9 F (36.6 C), temperature source Oral, resp. rate 18, SpO2 99%.  Wt Readings from Last 3 Encounters:  07/10/24 150 lb 8 oz (68.3 kg)  06/19/24 145 lb 12.8 oz (66.1 kg)  06/05/24 145 lb 12.8 oz (66.1 kg)    There is no height or weight on file to calculate BMI.  Performance status (ECOG): 2 - Symptomatic, <50% confined to bed  PHYSICAL EXAM:   GENERAL:alert, no distress  and comfortable SKIN: skin color, texture, turgor are normal, no rashes or significant lesions LYMPH:  no palpable lymphadenopathy in the cervical, axillary or inguinal LUNGS: clear to auscultation and percussion with normal breathing effort HEART: regular rate & rhythm and no murmurs and no lower extremity edema ABDOMEN:abdomen soft, non-tender and normal bowel sounds Musculoskeletal:no cyanosis of digits and no clubbing  NEURO: alert & oriented x 3 with fluent speech, no focal motor/sensory deficits  LABORATORY DATA:  I have reviewed the data as listed   Lab Results  Component Value Date   WBC 16.6 (H) 07/11/2024   NEUTROABS 14.2 (H) 07/11/2024   HGB 12.6 (L) 07/11/2024   HCT 38.2 (L) 07/11/2024   MCV 91.8 07/11/2024   PLT 223 07/11/2024      Chemistry      Component Value Date/Time   NA 139 07/11/2024 1112   K 3.6 07/11/2024 1112   CL 103 07/11/2024 1112   CO2 23 07/11/2024 1112   BUN 31 (H) 07/11/2024 1112   CREATININE 1.43 (H) 07/11/2024 1112   CREATININE 1.76 (H) 04/03/2023 0828      Component Value Date/Time  CALCIUM  8.8 (L) 07/11/2024 1112   ALKPHOS 77 07/11/2024 1112   AST 33 07/11/2024 1112   ALT 26 07/11/2024 1112   BILITOT 0.8 07/11/2024 1112       Latest Reference Range & Units 07/11/24 11:12  Chromogranin A (ng/mL) 0.0 - 101.8 ng/mL 124.3 (H)  (H): Data is abnormally high  Upper GI endoscopy: 06/19/2024:  Impression:  - 3 cm hiatal hernia.  - Normal stomach.  - Submucosal nodule found in the duodenum, consistent with known NET.  - No specimens collected.   RADIOGRAPHIC STUDIES: I have personally reviewed the radiological images as listed and agreed with the findings in the report.

## 2024-07-18 ENCOUNTER — Inpatient Hospital Stay: Admitting: Oncology

## 2024-07-18 ENCOUNTER — Inpatient Hospital Stay

## 2024-07-18 ENCOUNTER — Inpatient Hospital Stay: Attending: Oncology

## 2024-07-18 ENCOUNTER — Inpatient Hospital Stay (HOSPITAL_BASED_OUTPATIENT_CLINIC_OR_DEPARTMENT_OTHER): Admitting: Oncology

## 2024-07-18 VITALS — BP 126/68 | HR 56 | Temp 97.9°F | Resp 18

## 2024-07-18 DIAGNOSIS — D696 Thrombocytopenia, unspecified: Secondary | ICD-10-CM | POA: Diagnosis not present

## 2024-07-18 DIAGNOSIS — E3409 Other carcinoid syndrome: Secondary | ICD-10-CM | POA: Diagnosis not present

## 2024-07-18 DIAGNOSIS — E538 Deficiency of other specified B group vitamins: Secondary | ICD-10-CM | POA: Diagnosis not present

## 2024-07-18 DIAGNOSIS — C7A010 Malignant carcinoid tumor of the duodenum: Secondary | ICD-10-CM | POA: Diagnosis not present

## 2024-07-18 MED ORDER — LANREOTIDE ACETATE 120 MG/0.5ML ~~LOC~~ SOLN
120.0000 mg | Freq: Once | SUBCUTANEOUS | Status: AC
  Administered 2024-07-18: 120 mg via SUBCUTANEOUS
  Filled 2024-07-18: qty 120

## 2024-07-18 NOTE — Progress Notes (Signed)
Lanreotide injection given per orders. Patient tolerated it well without problems. Vitals stable and discharged home from clinic via wheelchair Follow up as scheduled.

## 2024-07-22 ENCOUNTER — Ambulatory Visit: Attending: Cardiology | Admitting: *Deleted

## 2024-07-22 DIAGNOSIS — F5104 Psychophysiologic insomnia: Secondary | ICD-10-CM | POA: Diagnosis not present

## 2024-07-22 DIAGNOSIS — Z23 Encounter for immunization: Secondary | ICD-10-CM | POA: Diagnosis not present

## 2024-07-22 DIAGNOSIS — S32010S Wedge compression fracture of first lumbar vertebra, sequela: Secondary | ICD-10-CM | POA: Diagnosis not present

## 2024-07-22 DIAGNOSIS — J309 Allergic rhinitis, unspecified: Secondary | ICD-10-CM | POA: Diagnosis not present

## 2024-07-22 DIAGNOSIS — I10 Essential (primary) hypertension: Secondary | ICD-10-CM | POA: Diagnosis not present

## 2024-07-22 DIAGNOSIS — K5090 Crohn's disease, unspecified, without complications: Secondary | ICD-10-CM | POA: Diagnosis not present

## 2024-07-22 DIAGNOSIS — Z5181 Encounter for therapeutic drug level monitoring: Secondary | ICD-10-CM

## 2024-07-22 DIAGNOSIS — I4891 Unspecified atrial fibrillation: Secondary | ICD-10-CM

## 2024-07-22 DIAGNOSIS — E782 Mixed hyperlipidemia: Secondary | ICD-10-CM | POA: Diagnosis not present

## 2024-07-22 DIAGNOSIS — K2100 Gastro-esophageal reflux disease with esophagitis, without bleeding: Secondary | ICD-10-CM | POA: Diagnosis not present

## 2024-07-22 DIAGNOSIS — E039 Hypothyroidism, unspecified: Secondary | ICD-10-CM | POA: Diagnosis not present

## 2024-07-22 DIAGNOSIS — W19XXXA Unspecified fall, initial encounter: Secondary | ICD-10-CM | POA: Diagnosis not present

## 2024-07-22 DIAGNOSIS — D3A8 Other benign neuroendocrine tumors: Secondary | ICD-10-CM | POA: Diagnosis not present

## 2024-07-22 LAB — POCT INR: INR: 2.6 (ref 2.0–3.0)

## 2024-07-22 NOTE — Progress Notes (Signed)
 INR 2.6; Please see anticoagulation encounter

## 2024-07-22 NOTE — Patient Instructions (Signed)
Continue warfarin 1 tablet daily except 1/2 tablet on Mondays and Thursdays Recheck in 4 weeks 

## 2024-07-30 ENCOUNTER — Encounter (INDEPENDENT_AMBULATORY_CARE_PROVIDER_SITE_OTHER): Payer: Self-pay | Admitting: Gastroenterology

## 2024-08-01 ENCOUNTER — Telehealth (INDEPENDENT_AMBULATORY_CARE_PROVIDER_SITE_OTHER): Payer: Self-pay

## 2024-08-01 NOTE — Telephone Encounter (Signed)
 Patient due for Entyvio  PAP Application to be renewed for 2026. I have filled out the portion of the PAP that I could patient will come in on August 19, 2024 @12 :30 pm to sign the forms and he will bring in the 2024 Tax information at that time.

## 2024-08-01 NOTE — Telephone Encounter (Signed)
 Patient moving to [facility], [place] and will need a Gastroenterologist there. They would like for you to make a referral for them to have a Gastroenterologist there as they have placed their home on the Market. Please advise.

## 2024-08-01 NOTE — Telephone Encounter (Signed)
 Thanks for the update. [name], can you please refer him to [facility]? I do not know any of the gastroenterologists in this area, so any of the ones that can accept his case would be ok. Thanks

## 2024-08-04 NOTE — Telephone Encounter (Signed)
 Referral has been sent to [facility], they will contact patient to schedule apt

## 2024-08-04 NOTE — Telephone Encounter (Signed)
 Patient wife called back this morning stating they need the referral to be sent to [facility], which is located at [employer] of the [address] [place] [address] physicians pavilion. All of his doctors will be in this building and they have hospital privileges.  (They wanted me to apologize for having to make this change).

## 2024-08-04 NOTE — Telephone Encounter (Signed)
 I made the patient and wife aware the new gastroenterologist in [facility], [place] would be in contact. They state understanding.

## 2024-08-04 NOTE — Telephone Encounter (Signed)
 Thanks

## 2024-08-05 DIAGNOSIS — N183 Chronic kidney disease, stage 3 unspecified: Secondary | ICD-10-CM | POA: Diagnosis not present

## 2024-08-05 DIAGNOSIS — R7301 Impaired fasting glucose: Secondary | ICD-10-CM | POA: Diagnosis not present

## 2024-08-05 DIAGNOSIS — E039 Hypothyroidism, unspecified: Secondary | ICD-10-CM | POA: Diagnosis not present

## 2024-08-05 DIAGNOSIS — E871 Hypo-osmolality and hyponatremia: Secondary | ICD-10-CM | POA: Diagnosis not present

## 2024-08-05 DIAGNOSIS — R5383 Other fatigue: Secondary | ICD-10-CM | POA: Diagnosis not present

## 2024-08-05 DIAGNOSIS — D72828 Other elevated white blood cell count: Secondary | ICD-10-CM | POA: Diagnosis not present

## 2024-08-05 DIAGNOSIS — R42 Dizziness and giddiness: Secondary | ICD-10-CM | POA: Diagnosis not present

## 2024-08-05 DIAGNOSIS — E7849 Other hyperlipidemia: Secondary | ICD-10-CM | POA: Diagnosis not present

## 2024-08-05 NOTE — Telephone Encounter (Signed)
 We need to make the patient and family aware when they call back that the patient will need a  repeat chromogranin level in 1 year around 12/10/2024.

## 2024-08-05 NOTE — Telephone Encounter (Signed)
 Thank you :)

## 2024-08-05 NOTE — Telephone Encounter (Signed)
 We received a letter from [facility] today stating they are not excepting new patients. I have made patient wife [name] (on [place]) aware of this, she will have their daughter call around in the area and find out who is excepting new patients. She will get the information and call us  back with this, so we can forward a referral there.

## 2024-08-08 DIAGNOSIS — E039 Hypothyroidism, unspecified: Secondary | ICD-10-CM | POA: Diagnosis not present

## 2024-08-08 DIAGNOSIS — I482 Chronic atrial fibrillation, unspecified: Secondary | ICD-10-CM | POA: Diagnosis not present

## 2024-08-08 DIAGNOSIS — C7A010 Malignant carcinoid tumor of the duodenum: Secondary | ICD-10-CM | POA: Diagnosis not present

## 2024-08-08 DIAGNOSIS — K5090 Crohn's disease, unspecified, without complications: Secondary | ICD-10-CM | POA: Diagnosis not present

## 2024-08-08 DIAGNOSIS — K2100 Gastro-esophageal reflux disease with esophagitis, without bleeding: Secondary | ICD-10-CM | POA: Diagnosis not present

## 2024-08-08 DIAGNOSIS — M545 Low back pain, unspecified: Secondary | ICD-10-CM | POA: Diagnosis not present

## 2024-08-08 DIAGNOSIS — Z6822 Body mass index (BMI) 22.0-22.9, adult: Secondary | ICD-10-CM | POA: Diagnosis not present

## 2024-08-08 DIAGNOSIS — I10 Essential (primary) hypertension: Secondary | ICD-10-CM | POA: Diagnosis not present

## 2024-08-08 DIAGNOSIS — Z0001 Encounter for general adult medical examination with abnormal findings: Secondary | ICD-10-CM | POA: Diagnosis not present

## 2024-08-08 DIAGNOSIS — K56600 Partial intestinal obstruction, unspecified as to cause: Secondary | ICD-10-CM | POA: Diagnosis not present

## 2024-08-11 ENCOUNTER — Telehealth: Payer: Self-pay | Admitting: Cardiology

## 2024-08-11 NOTE — Telephone Encounter (Signed)
 Pt c/o medication issue:  1. Name of Medication: warfarin (COUMADIN ) 2.5 MG tablet   2. How are you currently taking this medication (dosage and times per day)? As directed  3. Are you having a reaction (difficulty breathing--STAT)? no  4. What is your medication issue? Patient had a dental procedure this morning and states that he was prescribed amoxicillin which can interfere with his warfarin. He would like to speak with Olam in the coumadin  clinic regarding this.

## 2024-08-11 NOTE — Telephone Encounter (Signed)
 Called and spoke with pt.  Dentist started him on Amoxicillin 875mg  twice daily today for infected tooth/gum.  Will take x 10 days.  Told pt to continue current dose of warfarin but to eat extra servings of salads/greens this wekk.  Has an INR appt scheduled for 08/18/24.  He verbalized understanding.

## 2024-08-15 ENCOUNTER — Inpatient Hospital Stay

## 2024-08-15 VITALS — BP 141/82 | HR 62 | Temp 98.1°F | Resp 18

## 2024-08-15 DIAGNOSIS — E782 Mixed hyperlipidemia: Secondary | ICD-10-CM | POA: Diagnosis not present

## 2024-08-15 DIAGNOSIS — I10 Essential (primary) hypertension: Secondary | ICD-10-CM | POA: Diagnosis not present

## 2024-08-15 DIAGNOSIS — C7A010 Malignant carcinoid tumor of the duodenum: Secondary | ICD-10-CM | POA: Diagnosis not present

## 2024-08-15 DIAGNOSIS — E3409 Other carcinoid syndrome: Secondary | ICD-10-CM

## 2024-08-15 DIAGNOSIS — I4891 Unspecified atrial fibrillation: Secondary | ICD-10-CM | POA: Diagnosis not present

## 2024-08-15 DIAGNOSIS — K5090 Crohn's disease, unspecified, without complications: Secondary | ICD-10-CM | POA: Diagnosis not present

## 2024-08-15 MED ORDER — LANREOTIDE ACETATE 120 MG/0.5ML ~~LOC~~ SOLN
120.0000 mg | Freq: Once | SUBCUTANEOUS | Status: AC
  Administered 2024-08-15: 120 mg via SUBCUTANEOUS

## 2024-08-15 NOTE — Telephone Encounter (Signed)
 I spoke with the patient daughter Rosaline and made her aware the patient will need a repeat chromogranin level repeated around 12/10/2024. She will make note of this to have the new Doctors in [place] address this at the visit around that time.

## 2024-08-15 NOTE — Telephone Encounter (Signed)
 We need to make the patient and family aware when they call back that the patient will need a  repeat chromogranin level in 1 year around 12/10/2024.

## 2024-08-15 NOTE — Patient Instructions (Signed)
 Lanreotide Injection What is this medication? LANREOTIDE (lan REE oh tide) treats high levels of growth hormone (acromegaly). It is used when other therapies have not worked well enough or cannot be tolerated. It works by reducing the amount of growth hormone your body makes. This reduces symptoms and the risk of health problems caused by too much growth hormone, such as diabetes and heart disease. It may also be used to treat neuroendocrine tumors, a cancer of the cells that release hormones and other substances in your body. It works by slowing down the release of these substances from the cells. This slows tumor growth. It also decreases the symptoms of carcinoid syndrome, such as flushing or diarrhea. This medicine may be used for other purposes; ask your health care provider or pharmacist if you have questions. COMMON BRAND NAME(S): Somatuline Depot What should I tell my care team before I take this medication? They need to know if you have any of these conditions: Diabetes Gallbladder disease Heart disease Kidney disease Liver disease Pancreatic disease Thyroid disease An unusual or allergic reaction to lanreotide, other medications, foods, dyes, or preservatives Pregnant or trying to get pregnant Breastfeeding How should I use this medication? This medication is injected under the skin. It is given by your care team in a hospital or clinic setting. Talk to your care team about the use of this medication in children. Special care may be needed. Overdosage: If you think you have taken too much of this medicine contact a poison control center or emergency room at once. NOTE: This medicine is only for you. Do not share this medicine with others. What if I miss a dose? Keep appointments for follow-up doses. It is important not to miss your dose. Call your care team if you are unable to keep an appointment. What may interact with this medication? Bromocriptine Cyclosporine Certain  medications for blood pressure, heart disease, irregular heartbeat Certain medications for diabetes Quinidine Terfenadine This list may not describe all possible interactions. Give your health care provider a list of all the medicines, herbs, non-prescription drugs, or dietary supplements you use. Also tell them if you smoke, drink alcohol, or use illegal drugs. Some items may interact with your medicine. What should I watch for while using this medication? Visit your care team for regular checks on your progress. Tell your care team if your symptoms do not start to get better or if they get worse. Your condition will be monitored carefully while you are receiving this medication. You may need blood work while you are taking this medication. This medication may increase blood sugar. The risk may be higher in patients who already have diabetes. Ask your care team what you can do to lower your risk of diabetes while taking this medication. Talk to your care team if you wish to become pregnant or think you may be pregnant. This medication can cause serious birth defects. Do not breast-feed while taking this medication and for 6 months after stopping therapy. This medication may cause infertility. Talk to your care team if you are concerned about your fertility. What side effects may I notice from receiving this medication? Side effects that you should report to your care team as soon as possible: Allergic reactions--skin rash, itching, hives, swelling of the face, lips, tongue, or throat Gallbladder problems--severe stomach pain, nausea, vomiting, fever High blood sugar (hyperglycemia)--increased thirst or amount of urine, unusual weakness or fatigue, blurry vision Increase in blood pressure Low blood sugar (hypoglycemia)--pale, blue or purple  skin or lips, sweating, fussiness, rapid heartbeat, poor feeding, low body temperature Low thyroid levels (hypothyroidism)--unusual weakness or fatigue,  increased sensitivity to cold, constipation, hair loss, dry skin, weight gain, feelings of depression Oily or light-colored stools, diarrhea, bloating, weight loss Slow heartbeat--dizziness, feeling faint or lightheaded, confusion, trouble breathing, unusual weakness or fatigue Side effects that usually do not require medical attention (report these to your care team if they continue or are bothersome): Diarrhea Dizziness Headache Muscle spasms Nausea Pain, redness, or irritation at injection site Stomach pain This list may not describe all possible side effects. Call your doctor for medical advice about side effects. You may report side effects to FDA at 1-800-FDA-1088. Where should I keep my medication? This medication is given in a hospital or clinic. It will not be stored at home. NOTE: This sheet is a summary. It may not cover all possible information. If you have questions about this medicine, talk to your doctor, pharmacist, or health care provider.  2024 Elsevier/Gold Standard (2023-09-14 00:00:00)

## 2024-08-15 NOTE — Progress Notes (Signed)
 Patient tolerated Lanreotide injection with no complaints voiced.  Patient stable during and after injection.  See MAR for details.  Site clean and dry with no bruising or swelling noted at site.  Band aid applied.  Vss with discharge and left in satisfactory condition with no s/s of distress noted.    Orphia Mctigue

## 2024-08-15 NOTE — Telephone Encounter (Signed)
 Patient daughter Rosaline called to ask if we could send the patient's records/referral, with at least the last two office visit notes, labs, procedure, infusions ect.. to [facility] The Doctors in this group are Dr. Stacey, which is the preferred choice, and Dr. Ronita. She would like to be the one they call with the appointment information, and has asked that we ask them to call her [place]) at [phone]. Please send the referral to the following fax number of [phone]. Rosaline says these two Doctors take the patient's insurance and are taking new patients, and they have hospital privileges.

## 2024-08-18 ENCOUNTER — Ambulatory Visit: Attending: Cardiology | Admitting: *Deleted

## 2024-08-18 DIAGNOSIS — I4891 Unspecified atrial fibrillation: Secondary | ICD-10-CM | POA: Diagnosis not present

## 2024-08-18 DIAGNOSIS — Z5181 Encounter for therapeutic drug level monitoring: Secondary | ICD-10-CM

## 2024-08-18 LAB — POCT INR: INR: 1.5 — AB (ref 2.0–3.0)

## 2024-08-18 NOTE — Telephone Encounter (Signed)
 08/15/24 11:18 AM Note I spoke with the patient daughter Rosaline and made her aware the patient will need a repeat chromogranin level repeated around 12/10/2024. She will make note of this to have the new Doctors in [place] address this at the visit around that time.

## 2024-08-18 NOTE — Telephone Encounter (Signed)
 Referral sent, they will contact patient with apt

## 2024-08-18 NOTE — Patient Instructions (Signed)
 Take warfarin 1 1/2 tablet tonight then resume 1 tablet daily except 1/2 tablet on Mondays and Thursdays Recheck in 4 weeks

## 2024-08-18 NOTE — Progress Notes (Signed)
 INR-1.5; Please see anticoagulation encounter

## 2024-08-19 ENCOUNTER — Encounter: Attending: Gastroenterology | Admitting: Internal Medicine

## 2024-08-19 VITALS — BP 135/58 | HR 85 | Temp 97.7°F | Resp 16

## 2024-08-19 DIAGNOSIS — K50018 Crohn's disease of small intestine with other complication: Secondary | ICD-10-CM | POA: Diagnosis not present

## 2024-08-19 MED ORDER — VEDOLIZUMAB 300 MG IV SOLR
300.0000 mg | Freq: Once | INTRAVENOUS | Status: AC
  Administered 2024-08-19: 300 mg via INTRAVENOUS
  Filled 2024-08-19: qty 5

## 2024-08-19 NOTE — Progress Notes (Signed)
 Diagnosis: Crohn's Disease  Provider:  Mariette Cree NP  Procedure: IV Infusion  IV Type: Peripheral, IV Location: L Antecubital  Entyvio  (Vedolizumab ), Dose: 300 mg  Infusion Start Time: 1344  Infusion Stop Time: 1420  Post Infusion IV Care: Observation period completed  Discharge: Condition: Good, Destination: Home . AVS Provided  Performed by:  Blanca Selinda SAUNDERS, LPN

## 2024-08-19 NOTE — Telephone Encounter (Signed)
 Awaiting insurance information from [place]. (Betty's cell phone # [phone]). This will be the only phone they will be able to be reached at once they move.    Patient has an upcoming new patient appointment October 22, 2023 with Dr. Ronita. Had Entyvio  infusion today 08/19/2024, and due again on 10/14/2024 here at [facility]. Will be due again for infusion at new facility around the end of February 2026. Patient came by today to have his Patient assistance forms for Entyvio  filled out for free medication for 2026. Patient signed the forms, and will call me with new insurance information, once they obtain this from the insurance agency. They wanted me to go ahead and fill out the PAP for Entyvio  for 2026, and when they see the new Doctor in January, they will call Entyvio  Connect Alexis,their case worker and have the information changed from our providers and infusion clinic to the one in [facility], [place]/ [place] area.

## 2024-08-19 NOTE — Telephone Encounter (Signed)
 Patient has an upcoming New patient appointment October 22, 2023 with Dr. Ronita. Had Entyvio  infusion today 08/19/2024, and due again on 10/14/2024 here at [facility]. Will be due again for infusion at new facility around the end of February 2026.

## 2024-08-20 NOTE — Telephone Encounter (Signed)
 I reached out to the infusion clinic at [facility] for their tax Id and other information. Per Vernell Bruns she wants me to send her the patient application after I get the patient's insurance information for next year.   Email is:  [email or web address]  Or  fax is [phone]

## 2024-08-27 ENCOUNTER — Encounter (INDEPENDENT_AMBULATORY_CARE_PROVIDER_SITE_OTHER): Payer: Self-pay | Admitting: *Deleted

## 2024-08-27 NOTE — Telephone Encounter (Signed)
 We have filled out our portion of the PAP, and Provider has signed the forms. I have emailed these along with income and new insurance information and card for 2026, to [employer] with [employer]. She will fill in the Infusion Clinics NPI, Tax ID and other pertinent information and forward to the PAP for Entyvio . I have asked that she make me aware when she has forwarded this information to the Patient assistance Foundation for Entyvio . She has sent me a message back stating she has received the packet of information that I emailed to her.

## 2024-08-27 NOTE — Telephone Encounter (Signed)
 Patient daughter Rosaline called today with the patient insurance information for 2026. I have documented this and she will forward a copy of the cards so I can forward to the Pap program.

## 2024-08-28 DIAGNOSIS — M79672 Pain in left foot: Secondary | ICD-10-CM | POA: Diagnosis not present

## 2024-08-28 DIAGNOSIS — I739 Peripheral vascular disease, unspecified: Secondary | ICD-10-CM | POA: Diagnosis not present

## 2024-08-28 DIAGNOSIS — L110 Acquired keratosis follicularis: Secondary | ICD-10-CM | POA: Diagnosis not present

## 2024-08-28 DIAGNOSIS — M79674 Pain in right toe(s): Secondary | ICD-10-CM | POA: Diagnosis not present

## 2024-08-28 DIAGNOSIS — M79671 Pain in right foot: Secondary | ICD-10-CM | POA: Diagnosis not present

## 2024-08-28 DIAGNOSIS — M79675 Pain in left toe(s): Secondary | ICD-10-CM | POA: Diagnosis not present

## 2024-08-29 ENCOUNTER — Ambulatory Visit (INDEPENDENT_AMBULATORY_CARE_PROVIDER_SITE_OTHER): Admitting: Audiology

## 2024-09-01 NOTE — Telephone Encounter (Signed)
 Patient called today 09/01/2024 stating they have their home sold and they will be moving on 09/06/2024. He says he received a letter from us  stating,  08/27/24  [name] [address] [place] [address]   Our records indicate it is time for your repeat endoscopy.  I spoke with the patient and told him to show this information to the new Doctor he has an appointment with on 10/22/2023. This is correct right? He does not need us  to perform this right?

## 2024-09-01 NOTE — Telephone Encounter (Signed)
 Correct, this is not urgent and he can have this performed with his new gastroenterologist. Please extend to him and his wife my best wishes. Thanks

## 2024-09-02 NOTE — Telephone Encounter (Signed)
 I spoke with the patient and made him aware per Dr. Eartha, Correct, this is not urgent and he can have this performed with his new gastroenterologist. Please extend to him and his wife my best wishes.

## 2024-09-03 ENCOUNTER — Ambulatory Visit: Attending: Cardiology | Admitting: *Deleted

## 2024-09-03 DIAGNOSIS — Z5181 Encounter for therapeutic drug level monitoring: Secondary | ICD-10-CM

## 2024-09-03 DIAGNOSIS — I4891 Unspecified atrial fibrillation: Secondary | ICD-10-CM | POA: Diagnosis not present

## 2024-09-03 LAB — POCT INR: INR: 1.5 — AB (ref 2.0–3.0)

## 2024-09-03 NOTE — Progress Notes (Signed)
 INR-1.5; Please see anticoagulation encounter

## 2024-09-03 NOTE — Patient Instructions (Signed)
 Take warfarin extra 1/2 tablet tonight then increase dose to 1 tablet daily  Moving to [facility] 11/22 Will recheck pt on 11/26 Then transfer care to [facility]

## 2024-09-08 ENCOUNTER — Telehealth: Payer: Self-pay | Admitting: Cardiology

## 2024-09-08 NOTE — Telephone Encounter (Signed)
 1:00pm  Spoke to patient's daughter.  She wants to move up appt time on 09/10/24.  Appt moved to 11:15am.  She was appreciative of call back.

## 2024-09-08 NOTE — Telephone Encounter (Signed)
 09/08/24  11:16am   Returned wife's call.  No answer.

## 2024-09-08 NOTE — Telephone Encounter (Signed)
 Per Vernell Bruns she has already ordered the patient medication (Entyvio ) from December and will be good through the end of the year.

## 2024-09-08 NOTE — Telephone Encounter (Signed)
 Patient's wife is requesting to speak with RN Olam Bathe in regard to the patient's coumadin  schedule. Patient's wife requested we call her back at [phone]. Please advise.

## 2024-09-08 NOTE — Telephone Encounter (Signed)
 I sent a message to Vernell Bruns with [facility]. She states she has not yet sent the patient assistance forms on this patient free Entyvio  for 2026. She will wait until after 09/15/2024 to submit for next year.

## 2024-09-10 ENCOUNTER — Inpatient Hospital Stay

## 2024-09-10 ENCOUNTER — Ambulatory Visit: Attending: Cardiology | Admitting: *Deleted

## 2024-09-10 ENCOUNTER — Ambulatory Visit

## 2024-09-10 ENCOUNTER — Inpatient Hospital Stay: Attending: Oncology

## 2024-09-10 VITALS — BP 135/63 | HR 83 | Temp 97.6°F | Resp 18

## 2024-09-10 DIAGNOSIS — C7A010 Malignant carcinoid tumor of the duodenum: Secondary | ICD-10-CM | POA: Insufficient documentation

## 2024-09-10 DIAGNOSIS — I4891 Unspecified atrial fibrillation: Secondary | ICD-10-CM | POA: Diagnosis not present

## 2024-09-10 DIAGNOSIS — Z5181 Encounter for therapeutic drug level monitoring: Secondary | ICD-10-CM | POA: Diagnosis not present

## 2024-09-10 DIAGNOSIS — E3409 Other carcinoid syndrome: Secondary | ICD-10-CM

## 2024-09-10 LAB — POCT INR: INR: 2.7 (ref 2.0–3.0)

## 2024-09-10 MED ORDER — LANREOTIDE ACETATE 120 MG/0.5ML ~~LOC~~ SOLN
120.0000 mg | Freq: Once | SUBCUTANEOUS | Status: AC
  Administered 2024-09-10: 120 mg via SUBCUTANEOUS
  Filled 2024-09-10: qty 0.5

## 2024-09-10 NOTE — Progress Notes (Signed)
 INR 2.7; Please see anticoagulation encounter

## 2024-09-10 NOTE — Patient Instructions (Signed)
 Continue warfarin 1 tablet daily  Moved to [facility] 11/22 Recheck INR on 10/14/24 Then transfer care to [facility]

## 2024-09-10 NOTE — Patient Instructions (Signed)
 [facility] [place] - A DEPT OF [facility]  Discharge Instructions: Thank you for choosing [facility] to provide your oncology and hematology care.  If you have a lab appointment with the Cancer Center - please note that after April 8th, 2024, all labs will be drawn in the cancer center.  You do not have to check in or register with the main entrance as you have in the past but will complete your check-in in the cancer center.  Wear comfortable clothing and clothing appropriate for easy access to any Portacath or PICC line.   We strive to give you quality time with your provider. You may need to reschedule your appointment if you arrive late (15 or more minutes).  Arriving late affects you and other patients whose appointments are after yours.  Also, if you miss three or more appointments without notifying the office, you may be dismissed from the clinic at the provider's discretion.      For prescription refill requests, have your pharmacy contact our office and allow 72 hours for refills to be completed.    Today you received the following chemotherapy and/or immunotherapy agents Lanreotide      To help prevent nausea and vomiting after your treatment, we encourage you to take your nausea medication as directed.  BELOW ARE SYMPTOMS THAT SHOULD BE REPORTED IMMEDIATELY: *FEVER GREATER THAN 100.4 F (38 C) OR HIGHER *CHILLS OR SWEATING *NAUSEA AND VOMITING THAT IS NOT CONTROLLED WITH YOUR NAUSEA MEDICATION *UNUSUAL SHORTNESS OF BREATH *UNUSUAL BRUISING OR BLEEDING *URINARY PROBLEMS (pain or burning when urinating, or frequent urination) *BOWEL PROBLEMS (unusual diarrhea, constipation, pain near the anus) TENDERNESS IN MOUTH AND THROAT WITH OR WITHOUT PRESENCE OF ULCERS (sore throat, sores in mouth, or a toothache) UNUSUAL RASH, SWELLING OR PAIN  UNUSUAL VAGINAL DISCHARGE OR ITCHING   Items with * indicate a potential emergency and should be followed  up as soon as possible or go to the Emergency Department if any problems should occur.  Please show the CHEMOTHERAPY ALERT CARD or IMMUNOTHERAPY ALERT CARD at check-in to the Emergency Department and triage nurse.  Should you have questions after your visit or need to cancel or reschedule your appointment, please contact [facility] [place] - A DEPT OF [facility] [phone]  and follow the prompts.  Office hours are 8:00 a.m. to 4:30 p.m. Monday - Friday. Please note that voicemails left after 4:00 p.m. may not be returned until the following business day.  We are closed weekends and major holidays. You have access to a nurse at all times for urgent questions. Please call the main number to the clinic [phone] and follow the prompts.  For any non-urgent questions, you may also contact your provider using MyChart. We now offer e-Visits for anyone 62 and older to request care online for non-urgent symptoms. For details visit [email or web address].   Also download the MyChart app! Go to the app store, search "MyChart", open the app, select , and log in with your MyChart username and password.

## 2024-09-10 NOTE — Progress Notes (Signed)
 [name] presents today for Lanreotide injection per the provider's orders.  Stable during administration without incident; injection site WNL; see MAR for injection details.  Patient tolerated procedure well and without incident.  No questions or complaints noted at this time.

## 2024-09-15 DIAGNOSIS — I480 Paroxysmal atrial fibrillation: Secondary | ICD-10-CM | POA: Diagnosis not present

## 2024-09-15 DIAGNOSIS — C7A8 Other malignant neuroendocrine tumors: Secondary | ICD-10-CM | POA: Diagnosis not present

## 2024-09-15 DIAGNOSIS — K219 Gastro-esophageal reflux disease without esophagitis: Secondary | ICD-10-CM | POA: Diagnosis not present

## 2024-09-15 DIAGNOSIS — E079 Disorder of thyroid, unspecified: Secondary | ICD-10-CM | POA: Diagnosis not present

## 2024-09-15 DIAGNOSIS — H900 Conductive hearing loss, bilateral: Secondary | ICD-10-CM | POA: Diagnosis not present

## 2024-09-15 DIAGNOSIS — S32018S Other fracture of first lumbar vertebra, sequela: Secondary | ICD-10-CM | POA: Diagnosis not present

## 2024-09-17 ENCOUNTER — Telehealth: Payer: Self-pay | Admitting: Cardiology

## 2024-09-17 DIAGNOSIS — I2510 Atherosclerotic heart disease of native coronary artery without angina pectoris: Secondary | ICD-10-CM

## 2024-09-17 DIAGNOSIS — Z7689 Persons encountering health services in other specified circumstances: Secondary | ICD-10-CM

## 2024-09-17 DIAGNOSIS — I4821 Permanent atrial fibrillation: Secondary | ICD-10-CM

## 2024-09-17 NOTE — Telephone Encounter (Signed)
 Referral placed & pcc notified.

## 2024-09-17 NOTE — Telephone Encounter (Signed)
 Patient notified via mychart

## 2024-09-17 NOTE — Telephone Encounter (Signed)
 Pt daughter called in stating they are moving. She requested a referral for pt be sent to   Heart & Vascular Cardiology at [facility]  Fax: [phone] Dr. Armanda

## 2024-09-19 DIAGNOSIS — N281 Cyst of kidney, acquired: Secondary | ICD-10-CM | POA: Diagnosis not present

## 2024-09-19 NOTE — Telephone Encounter (Signed)
 09/19/2024: I spoke with Dana B at Entyvio  connect and she says they have received the patient new insurance, application, and income information. The patient is currently undergoing a benefit investigation, due to the new insurance and they will be processing this to see if patient is eligible for assistance for 2026. She says we may have a determination by the week of 09/22/2024.

## 2024-09-22 ENCOUNTER — Ambulatory Visit: Admitting: Cardiology

## 2024-09-24 ENCOUNTER — Telehealth (INDEPENDENT_AMBULATORY_CARE_PROVIDER_SITE_OTHER): Payer: Self-pay

## 2024-09-24 ENCOUNTER — Ambulatory Visit: Admitting: Cardiology

## 2024-09-24 NOTE — Telephone Encounter (Signed)
 09/24/2024: I spoke with Dana B at [employer]  connect, and she says they have all of the patient information, including his new insurance information for 2026. Patient is covered with current benefits through 10/15/2024. They will have to run new benefits on 10/17/2024, once the new plan is effective on 10/16/2024.

## 2024-09-24 NOTE — Telephone Encounter (Signed)
 Thank you :)

## 2024-09-24 NOTE — Telephone Encounter (Signed)
 I spoke with the patient wife Dickey. I made her aware that Lonell B with [employer]  connect says they are good on treatment through 10/15/2024 and they would re run patient benefits for 2026 on 10/18/2023. They state understanding.

## 2024-09-24 NOTE — Telephone Encounter (Signed)
 Patient had a recent trip to the ED in [place] 09/18/2024 due to severe abdominal pain, was told he had a intestinal infection and was sent home. Patient reports having ate a hamburger with onion that evening. Patient does not establish care with the new gastrointestinal doctor until 10/21/2024. Patient doing much better now. Would you mind looking at records for the patient to make sure nothing is going on gastro related that needs to be looked at prior to establishing care with the new doctor?

## 2024-09-24 NOTE — Telephone Encounter (Signed)
 I spoke with the patient wife [name], and made her aware per Dr. Eartha, I reviewed the records.  Unfortunately I cannot review the images but report of the CT scan showed some mild inflammation in his small bowel.  Unclear if this is related to a transient infection versus to his Crohn's disease.  As he is feeling better, will hold off on further investigations or management for now.  However, he should let us  know if nausea, vomiting, abdominal pain diarrhea recurs as we may need to order stool testing and possibly give him a course of steroids. I made them aware if they need anything prior to them seeing the new doctor to reach out to us .  They state understanding, and say Thank you for all you have done for them!

## 2024-09-24 NOTE — Telephone Encounter (Signed)
 I reviewed the records.  Unfortunately I cannot review the images but report of the CT scan showed some mild inflammation in his small bowel.  Unclear if this is related to a transient infection versus to his Crohn's disease.  As he is feeling better, will hold off on further investigations or management for now.  However, he should let us  know if nausea, vomiting, abdominal pain diarrhea recurs as we may need to order stool testing and possibly give him a course of steroids.

## 2024-09-24 NOTE — Telephone Encounter (Signed)
 Entyvio  [employer] [phone] ext [phone].

## 2024-09-26 ENCOUNTER — Telehealth (INDEPENDENT_AMBULATORY_CARE_PROVIDER_SITE_OTHER): Payer: Self-pay | Admitting: Gastroenterology

## 2024-09-26 NOTE — Telephone Encounter (Signed)
 Pt wife [name] left voicemail for [name] asking for a call back due to them receiving the patient assistance application for Entyvio .  Returned call to [name] to let her know [name] is out of the office until Tuesday. [name] stated she thinks this may be a duplicate and that [name] has already filled it out, so she wanted to make sure everything was ok. [name] that I will let [name] know and she would call on Tuesday.

## 2024-09-30 NOTE — Telephone Encounter (Signed)
 I spoke with the patient wife Dickey, and made her aware, that we already forwarded the patient's new application for Entyvio , and she can disregard the letter she received in the mail. She states understanding.

## 2024-10-13 ENCOUNTER — Encounter: Payer: Self-pay | Admitting: *Deleted

## 2024-10-14 ENCOUNTER — Encounter: Attending: Gastroenterology | Admitting: Internal Medicine

## 2024-10-14 ENCOUNTER — Inpatient Hospital Stay: Attending: Oncology

## 2024-10-14 ENCOUNTER — Ambulatory Visit: Attending: Cardiology | Admitting: *Deleted

## 2024-10-14 VITALS — BP 145/78 | HR 66 | Temp 97.8°F | Resp 16

## 2024-10-14 VITALS — BP 137/91 | HR 97 | Temp 97.0°F | Resp 18

## 2024-10-14 DIAGNOSIS — C7A010 Malignant carcinoid tumor of the duodenum: Secondary | ICD-10-CM | POA: Insufficient documentation

## 2024-10-14 DIAGNOSIS — I4891 Unspecified atrial fibrillation: Secondary | ICD-10-CM

## 2024-10-14 DIAGNOSIS — K50018 Crohn's disease of small intestine with other complication: Secondary | ICD-10-CM | POA: Insufficient documentation

## 2024-10-14 DIAGNOSIS — E3409 Other carcinoid syndrome: Secondary | ICD-10-CM

## 2024-10-14 DIAGNOSIS — Z5181 Encounter for therapeutic drug level monitoring: Secondary | ICD-10-CM

## 2024-10-14 LAB — POCT INR: INR: 2.5 (ref 2.0–3.0)

## 2024-10-14 MED ORDER — LANREOTIDE ACETATE 120 MG/0.5ML ~~LOC~~ SOLN
120.0000 mg | Freq: Once | SUBCUTANEOUS | Status: AC
  Administered 2024-10-14: 120 mg via SUBCUTANEOUS
  Filled 2024-10-14: qty 0.5

## 2024-10-14 MED ORDER — VEDOLIZUMAB 300 MG IV SOLR
300.0000 mg | Freq: Once | INTRAVENOUS | Status: AC
  Administered 2024-10-14: 300 mg via INTRAVENOUS
  Filled 2024-10-14: qty 5

## 2024-10-14 NOTE — Progress Notes (Signed)
 INR 2.5. Please see anticoagulation encounter

## 2024-10-14 NOTE — Patient Instructions (Signed)
 Continue warfarin 1 tablet daily  Moved to [facility] 11/22 Transfer care to [facility]

## 2024-10-14 NOTE — Progress Notes (Signed)
 Patient tolerated lanreotide injection with no complaints voiced.  Site clean and dry with no bruising or swelling noted at site.  See MAR for details.  Band aid applied.  Patient stable during and after injection.  Vss with discharge and left in satisfactory condition with no s/s of distress noted. All follow ups as scheduled.   [name] [employer]

## 2024-10-14 NOTE — Progress Notes (Signed)
 Diagnosis:  Crohn's Disease  Provider:  Mariette Cree NP  Procedure: IV Infusion  IV Type: Peripheral, IV Location: L Antecubital  Entyvio  (Vedolizumab ), Dose: 300 mg  Infusion Start Time: 1329  Infusion Stop Time: 1414  Post Infusion IV Care: Observation period completed  Discharge: Condition: Good, Destination: Home . AVS Provided  Performed by:  Blanca Selinda SAUNDERS, LPN

## 2024-10-17 NOTE — Telephone Encounter (Signed)
 10/18/2023: I spoke with Dana B with [employer]  and she says the patient is actively in the Benefit investigation and they will let us  know the determination, which could be early next week.

## 2024-10-21 NOTE — Telephone Encounter (Signed)
 I spoke with the patient and they say the new patient appointment with the new gastroenterologist cancelled the appointment with them today and have rescheduled to 11/24/2024, (next En[employer]vio  infusion due 12/09/2024). I made her aware that I had spoken with [employer]  [employer] this morning and they were still doing a benefit investigation and would try to expedite. Dickey made aware and states understanding.

## 2024-10-21 NOTE — Telephone Encounter (Signed)
 I spoke with Deward POUR (case manager), and he states the patient is still in the process of benefits investigation. He will place a note in to see if they can move this forward.

## 2024-10-23 NOTE — Telephone Encounter (Signed)
 I made the patient and their daughter Rosaline aware the medication has been approved until 10/15/2025. I advised that they may want to have one more treatment here at [facility], before transferring the care to where they now stay as the appointment with the [facility] doctor that had been scheduled for 10/22/2023 was cancelled by the doctors office and rescheduled to 11/24/2024 , and in need of next infusion on 12/09/2024. They state understanding and will reach out to the new doctor to see if they can see any sooner, and if not they will call [facility] for one more infusion, prior to transferring the Patient assistance over to the new doctor with the facility he will be transferring to for his infusions.

## 2024-10-23 NOTE — Telephone Encounter (Signed)
 I received a letter from [employer]  [employer] stating the patient has been approved for [employer]  patient assistance program through 10/15/2025.

## 2024-10-23 NOTE — Telephone Encounter (Signed)
 [employer]io  PAP approval faxed to [facility].

## 2024-10-24 ENCOUNTER — Telehealth: Payer: Self-pay

## 2024-10-24 NOTE — Telephone Encounter (Signed)
 Patient is receiving externally supplied medications [id number] assistance Medication: Entyvio  Source of Medication: [employer]s Approval Dates: from 10/23/2024 until 10/15/2025

## 2024-10-29 ENCOUNTER — Telehealth (HOSPITAL_COMMUNITY): Payer: Self-pay | Admitting: Pharmacist

## 2024-10-29 NOTE — Telephone Encounter (Signed)
 Therapy plan discontinued. Patient has relocated to [facility] [facility], PharmD, MPH, BCPS, CPP Clinical Pharmacist

## 2024-11-06 ENCOUNTER — Telehealth (INDEPENDENT_AMBULATORY_CARE_PROVIDER_SITE_OTHER): Payer: Self-pay | Admitting: *Deleted

## 2024-11-06 ENCOUNTER — Encounter (INDEPENDENT_AMBULATORY_CARE_PROVIDER_SITE_OTHER): Payer: Self-pay | Admitting: *Deleted

## 2024-11-06 NOTE — Telephone Encounter (Signed)
 Patient is due for 1 yr repeat CT in February

## 2024-11-06 NOTE — Telephone Encounter (Signed)
 I spoke with the patient daughter and made her aware the patient will need repeat CT of the abdomen pelvis with IV contrast in February and and chromogranin A levels in March 2025. I asked that she discuss this with the new doctor there. She reports she got the appointment for Keyandre rescheduled from November 24, 2024 to 11/10/2024, but unsure this will happen due to the weather coming in. She was also reminded that if not able to see the new doctor until late February she will need to call [facility] to schedule his Entyvio  infusion as there may be complications of getting the medication from [employer]  [facility] mailed to the new location. She has the number to [facility] and she will call them if the new gastroenterology appointment is pushed out further.

## 2024-11-09 ENCOUNTER — Other Ambulatory Visit: Payer: Self-pay | Admitting: Cardiology
